# Patient Record
Sex: Female | Born: 1935 | Race: Black or African American | Hispanic: No | Smoking: Former smoker
Health system: Southern US, Community
[De-identification: ages and names within clinical notes are randomized; demographics above are authoritative.]

## PROBLEM LIST (undated history)

## (undated) DIAGNOSIS — E782 Mixed hyperlipidemia: Secondary | ICD-10-CM

## (undated) DIAGNOSIS — I447 Left bundle-branch block, unspecified: Secondary | ICD-10-CM

## (undated) DIAGNOSIS — J189 Pneumonia, unspecified organism: Secondary | ICD-10-CM

## (undated) DIAGNOSIS — I429 Cardiomyopathy, unspecified: Secondary | ICD-10-CM

## (undated) DIAGNOSIS — F329 Major depressive disorder, single episode, unspecified: Secondary | ICD-10-CM

## (undated) DIAGNOSIS — R011 Cardiac murmur, unspecified: Secondary | ICD-10-CM

## (undated) DIAGNOSIS — R7989 Other specified abnormal findings of blood chemistry: Secondary | ICD-10-CM

## (undated) DIAGNOSIS — M5481 Occipital neuralgia: Secondary | ICD-10-CM

## (undated) DIAGNOSIS — R0600 Dyspnea, unspecified: Secondary | ICD-10-CM

## (undated) DIAGNOSIS — I10 Essential (primary) hypertension: Secondary | ICD-10-CM

## (undated) DIAGNOSIS — M1990 Unspecified osteoarthritis, unspecified site: Secondary | ICD-10-CM

## (undated) DIAGNOSIS — R413 Other amnesia: Secondary | ICD-10-CM

## (undated) DIAGNOSIS — R3915 Urgency of urination: Secondary | ICD-10-CM

## (undated) DIAGNOSIS — M8580 Other specified disorders of bone density and structure, unspecified site: Secondary | ICD-10-CM

## (undated) DIAGNOSIS — K219 Gastro-esophageal reflux disease without esophagitis: Secondary | ICD-10-CM

## (undated) DIAGNOSIS — F32A Depression, unspecified: Secondary | ICD-10-CM

## (undated) DIAGNOSIS — J45909 Unspecified asthma, uncomplicated: Secondary | ICD-10-CM

## (undated) DIAGNOSIS — J449 Chronic obstructive pulmonary disease, unspecified: Secondary | ICD-10-CM

## (undated) DIAGNOSIS — R51 Headache: Secondary | ICD-10-CM

## (undated) DIAGNOSIS — F419 Anxiety disorder, unspecified: Secondary | ICD-10-CM

## (undated) DIAGNOSIS — Z9289 Personal history of other medical treatment: Secondary | ICD-10-CM

## (undated) DIAGNOSIS — R519 Headache, unspecified: Secondary | ICD-10-CM

## (undated) HISTORY — DX: Anxiety disorder, unspecified: F41.9

## (undated) HISTORY — DX: Depression, unspecified: F32.A

## (undated) HISTORY — PX: EYE SURGERY: SHX253

## (undated) HISTORY — DX: Other amnesia: R41.3

## (undated) HISTORY — PX: APPENDECTOMY: SHX54

## (undated) HISTORY — PX: DILATION AND CURETTAGE OF UTERUS: SHX78

## (undated) HISTORY — DX: Other specified abnormal findings of blood chemistry: R79.89

## (undated) HISTORY — PX: BACK SURGERY: SHX140

## (undated) HISTORY — PX: VAGINAL HYSTERECTOMY: SUR661

## (undated) HISTORY — DX: Other specified disorders of bone density and structure, unspecified site: M85.80

## (undated) HISTORY — DX: Major depressive disorder, single episode, unspecified: F32.9

## (undated) HISTORY — PX: TONSILLECTOMY: SUR1361

## (undated) HISTORY — PX: JOINT REPLACEMENT: SHX530

---

## 1989-04-30 HISTORY — PX: LAPAROSCOPIC CHOLECYSTECTOMY: SUR755

## 2000-11-15 ENCOUNTER — Ambulatory Visit (HOSPITAL_COMMUNITY): Admission: RE | Admit: 2000-11-15 | Discharge: 2000-11-15 | Payer: Self-pay | Admitting: Internal Medicine

## 2000-11-15 ENCOUNTER — Other Ambulatory Visit: Admission: RE | Admit: 2000-11-15 | Discharge: 2000-11-15 | Payer: Self-pay | Admitting: Internal Medicine

## 2000-11-15 ENCOUNTER — Encounter: Payer: Self-pay | Admitting: Internal Medicine

## 2000-11-18 ENCOUNTER — Encounter: Payer: Self-pay | Admitting: Internal Medicine

## 2000-11-18 ENCOUNTER — Ambulatory Visit (HOSPITAL_COMMUNITY): Admission: RE | Admit: 2000-11-18 | Discharge: 2000-11-18 | Payer: Self-pay | Admitting: Internal Medicine

## 2001-02-01 ENCOUNTER — Emergency Department (HOSPITAL_COMMUNITY): Admission: EM | Admit: 2001-02-01 | Discharge: 2001-02-01 | Payer: Self-pay | Admitting: *Deleted

## 2001-02-01 ENCOUNTER — Encounter: Payer: Self-pay | Admitting: *Deleted

## 2001-10-29 ENCOUNTER — Emergency Department (HOSPITAL_COMMUNITY): Admission: EM | Admit: 2001-10-29 | Discharge: 2001-10-29 | Payer: Self-pay | Admitting: Emergency Medicine

## 2001-12-11 ENCOUNTER — Encounter: Payer: Self-pay | Admitting: Internal Medicine

## 2001-12-11 ENCOUNTER — Ambulatory Visit (HOSPITAL_COMMUNITY): Admission: RE | Admit: 2001-12-11 | Discharge: 2001-12-11 | Payer: Self-pay | Admitting: Internal Medicine

## 2002-07-11 ENCOUNTER — Encounter: Payer: Self-pay | Admitting: Internal Medicine

## 2002-07-11 ENCOUNTER — Emergency Department (HOSPITAL_COMMUNITY): Admission: EM | Admit: 2002-07-11 | Discharge: 2002-07-11 | Payer: Self-pay | Admitting: Emergency Medicine

## 2002-12-23 ENCOUNTER — Ambulatory Visit (HOSPITAL_COMMUNITY): Admission: RE | Admit: 2002-12-23 | Discharge: 2002-12-23 | Payer: Self-pay | Admitting: Internal Medicine

## 2002-12-23 ENCOUNTER — Encounter: Payer: Self-pay | Admitting: Internal Medicine

## 2003-01-01 ENCOUNTER — Emergency Department (HOSPITAL_COMMUNITY): Admission: EM | Admit: 2003-01-01 | Discharge: 2003-01-01 | Payer: Self-pay | Admitting: Emergency Medicine

## 2003-03-28 ENCOUNTER — Emergency Department (HOSPITAL_COMMUNITY): Admission: EM | Admit: 2003-03-28 | Discharge: 2003-03-28 | Payer: Self-pay | Admitting: Emergency Medicine

## 2003-04-23 ENCOUNTER — Encounter: Payer: Self-pay | Admitting: Orthopedic Surgery

## 2003-06-01 ENCOUNTER — Encounter (HOSPITAL_COMMUNITY): Admission: RE | Admit: 2003-06-01 | Discharge: 2003-07-01 | Payer: Self-pay | Admitting: Orthopedic Surgery

## 2003-07-06 ENCOUNTER — Encounter (HOSPITAL_COMMUNITY): Admission: RE | Admit: 2003-07-06 | Discharge: 2003-08-05 | Payer: Self-pay | Admitting: Orthopedic Surgery

## 2003-11-15 ENCOUNTER — Ambulatory Visit (HOSPITAL_COMMUNITY): Admission: RE | Admit: 2003-11-15 | Discharge: 2003-11-15 | Payer: Self-pay | Admitting: Internal Medicine

## 2004-01-13 ENCOUNTER — Ambulatory Visit (HOSPITAL_COMMUNITY): Admission: RE | Admit: 2004-01-13 | Discharge: 2004-01-13 | Payer: Self-pay | Admitting: Internal Medicine

## 2004-05-18 ENCOUNTER — Ambulatory Visit (HOSPITAL_COMMUNITY): Admission: RE | Admit: 2004-05-18 | Discharge: 2004-05-18 | Payer: Self-pay | Admitting: Internal Medicine

## 2004-06-08 ENCOUNTER — Emergency Department (HOSPITAL_COMMUNITY): Admission: EM | Admit: 2004-06-08 | Discharge: 2004-06-08 | Payer: Self-pay | Admitting: Emergency Medicine

## 2004-09-04 ENCOUNTER — Ambulatory Visit: Payer: Self-pay | Admitting: Orthopedic Surgery

## 2005-07-16 ENCOUNTER — Ambulatory Visit: Payer: Self-pay | Admitting: Orthopedic Surgery

## 2005-08-29 ENCOUNTER — Ambulatory Visit: Payer: Self-pay | Admitting: Cardiology

## 2005-09-25 ENCOUNTER — Ambulatory Visit: Payer: Self-pay

## 2005-09-25 ENCOUNTER — Encounter: Payer: Self-pay | Admitting: Cardiology

## 2006-05-12 ENCOUNTER — Emergency Department (HOSPITAL_COMMUNITY): Admission: EM | Admit: 2006-05-12 | Discharge: 2006-05-12 | Payer: Self-pay | Admitting: Emergency Medicine

## 2007-04-16 ENCOUNTER — Ambulatory Visit: Payer: Self-pay | Admitting: Cardiology

## 2007-04-22 ENCOUNTER — Encounter: Payer: Self-pay | Admitting: Cardiology

## 2007-04-22 ENCOUNTER — Ambulatory Visit: Payer: Self-pay

## 2007-05-05 ENCOUNTER — Ambulatory Visit: Payer: Self-pay

## 2007-07-24 ENCOUNTER — Ambulatory Visit: Payer: Self-pay | Admitting: Orthopedic Surgery

## 2007-07-24 DIAGNOSIS — IMO0002 Reserved for concepts with insufficient information to code with codable children: Secondary | ICD-10-CM

## 2007-10-08 ENCOUNTER — Ambulatory Visit: Payer: Self-pay | Admitting: Gastroenterology

## 2007-10-22 ENCOUNTER — Ambulatory Visit: Payer: Self-pay | Admitting: Gastroenterology

## 2008-04-30 HISTORY — PX: CARDIAC CATHETERIZATION: SHX172

## 2008-05-07 ENCOUNTER — Ambulatory Visit: Payer: Self-pay | Admitting: Cardiology

## 2008-05-11 ENCOUNTER — Ambulatory Visit: Payer: Self-pay | Admitting: Cardiology

## 2008-05-11 ENCOUNTER — Ambulatory Visit (HOSPITAL_COMMUNITY): Admission: RE | Admit: 2008-05-11 | Discharge: 2008-05-11 | Payer: Self-pay | Admitting: Cardiology

## 2008-05-25 ENCOUNTER — Inpatient Hospital Stay (HOSPITAL_COMMUNITY): Admission: EM | Admit: 2008-05-25 | Discharge: 2008-05-28 | Payer: Self-pay | Admitting: Emergency Medicine

## 2008-06-28 ENCOUNTER — Ambulatory Visit: Payer: Self-pay | Admitting: Cardiology

## 2008-06-28 ENCOUNTER — Ambulatory Visit: Payer: Self-pay | Admitting: Gastroenterology

## 2008-07-10 ENCOUNTER — Emergency Department (HOSPITAL_COMMUNITY): Admission: EM | Admit: 2008-07-10 | Discharge: 2008-07-10 | Payer: Self-pay | Admitting: Emergency Medicine

## 2008-07-12 ENCOUNTER — Emergency Department (HOSPITAL_COMMUNITY): Admission: EM | Admit: 2008-07-12 | Discharge: 2008-07-12 | Payer: Self-pay | Admitting: Emergency Medicine

## 2008-07-20 ENCOUNTER — Inpatient Hospital Stay (HOSPITAL_COMMUNITY): Admission: RE | Admit: 2008-07-20 | Discharge: 2008-07-27 | Payer: Self-pay | Admitting: Neurosurgery

## 2008-07-27 ENCOUNTER — Inpatient Hospital Stay: Admission: RE | Admit: 2008-07-27 | Discharge: 2008-08-11 | Payer: Self-pay | Admitting: Internal Medicine

## 2008-08-16 DIAGNOSIS — E785 Hyperlipidemia, unspecified: Secondary | ICD-10-CM

## 2008-08-16 DIAGNOSIS — M19011 Primary osteoarthritis, right shoulder: Secondary | ICD-10-CM | POA: Insufficient documentation

## 2008-08-16 DIAGNOSIS — I10 Essential (primary) hypertension: Secondary | ICD-10-CM

## 2008-08-16 DIAGNOSIS — B1910 Unspecified viral hepatitis B without hepatic coma: Secondary | ICD-10-CM | POA: Insufficient documentation

## 2008-11-24 ENCOUNTER — Ambulatory Visit: Payer: Self-pay | Admitting: Cardiology

## 2008-12-07 ENCOUNTER — Ambulatory Visit: Payer: Self-pay | Admitting: Pulmonary Disease

## 2008-12-07 ENCOUNTER — Encounter: Payer: Self-pay | Admitting: Internal Medicine

## 2008-12-07 DIAGNOSIS — J309 Allergic rhinitis, unspecified: Secondary | ICD-10-CM | POA: Insufficient documentation

## 2008-12-07 DIAGNOSIS — R053 Chronic cough: Secondary | ICD-10-CM | POA: Insufficient documentation

## 2008-12-07 DIAGNOSIS — R05 Cough: Secondary | ICD-10-CM | POA: Insufficient documentation

## 2009-04-30 HISTORY — PX: SHOULDER OPEN ROTATOR CUFF REPAIR: SHX2407

## 2009-04-30 HISTORY — PX: LUMBAR LAMINECTOMY: SHX95

## 2010-02-01 ENCOUNTER — Observation Stay (HOSPITAL_COMMUNITY): Admission: RE | Admit: 2010-02-01 | Discharge: 2010-02-02 | Payer: Self-pay | Admitting: Orthopedic Surgery

## 2010-03-13 ENCOUNTER — Encounter
Admission: RE | Admit: 2010-03-13 | Discharge: 2010-04-27 | Payer: Self-pay | Source: Home / Self Care | Attending: Orthopedic Surgery | Admitting: Orthopedic Surgery

## 2010-03-21 ENCOUNTER — Encounter: Payer: Self-pay | Admitting: Orthopedic Surgery

## 2010-03-21 ENCOUNTER — Telehealth (INDEPENDENT_AMBULATORY_CARE_PROVIDER_SITE_OTHER): Payer: Self-pay | Admitting: *Deleted

## 2010-04-05 ENCOUNTER — Telehealth (INDEPENDENT_AMBULATORY_CARE_PROVIDER_SITE_OTHER): Payer: Self-pay | Admitting: *Deleted

## 2010-04-30 ENCOUNTER — Encounter
Admission: RE | Admit: 2010-04-30 | Discharge: 2010-05-30 | Payer: Self-pay | Source: Home / Self Care | Attending: Orthopedic Surgery | Admitting: Orthopedic Surgery

## 2010-05-30 NOTE — Progress Notes (Signed)
   Request for Records received from [facility] sent to [facility] for processing.' [facility]  April 05, 2010 9:44 AM

## 2010-05-30 NOTE — Progress Notes (Signed)
  Phone Note Other Incoming   Request: Send information Summary of Call: Records request received from [employer], request forwarded to [facility].

## 2010-06-01 ENCOUNTER — Encounter: Payer: Self-pay | Admitting: Physical Therapy

## 2010-06-01 NOTE — Letter (Signed)
Summary: Medical record request   Medical record request   Imported By: Jacklynn Ganong Apr 05, 202011 11:51:27  _____________________________________________________________________  External Attachment:    Type:   Image     Comment:   External Document

## 2010-06-05 ENCOUNTER — Ambulatory Visit: Payer: Self-pay | Admitting: Physical Therapy

## 2010-06-08 ENCOUNTER — Ambulatory Visit: Payer: MEDICARE | Attending: Orthopedic Surgery | Admitting: *Deleted

## 2010-06-08 DIAGNOSIS — M25519 Pain in unspecified shoulder: Secondary | ICD-10-CM | POA: Insufficient documentation

## 2010-06-08 DIAGNOSIS — R5381 Other malaise: Secondary | ICD-10-CM | POA: Insufficient documentation

## 2010-06-08 DIAGNOSIS — IMO0001 Reserved for inherently not codable concepts without codable children: Secondary | ICD-10-CM | POA: Insufficient documentation

## 2010-06-08 DIAGNOSIS — M25619 Stiffness of unspecified shoulder, not elsewhere classified: Secondary | ICD-10-CM | POA: Insufficient documentation

## 2010-06-13 ENCOUNTER — Ambulatory Visit: Payer: MEDICARE | Admitting: Physical Therapy

## 2010-06-13 ENCOUNTER — Ambulatory Visit: Payer: MEDICARE | Admitting: *Deleted

## 2010-07-13 LAB — DIFFERENTIAL
Basophils Absolute: 0 10*3/uL (ref 0.0–0.1)
Basophils Relative: 0 % (ref 0–1)
Neutro Abs: 4.5 10*3/uL (ref 1.7–7.7)
Neutrophils Relative %: 63 % (ref 43–77)

## 2010-07-13 LAB — COMPREHENSIVE METABOLIC PANEL
ALT: 22 U/L (ref 0–35)
AST: 27 U/L (ref 0–37)
Albumin: 4.1 g/dL (ref 3.5–5.2)
Alkaline Phosphatase: 82 U/L (ref 39–117)
BUN: 14 mg/dL (ref 6–23)
Chloride: 106 mEq/L (ref 96–112)
GFR calc Af Amer: 57 mL/min — ABNORMAL LOW (ref >60)
Potassium: 3.6 mEq/L (ref 3.5–5.1)
Sodium: 141 mEq/L (ref 135–145)
Total Bilirubin: 0.5 mg/dL (ref 0.3–1.2)
Total Protein: 7.5 g/dL (ref 6.0–8.3)

## 2010-07-13 LAB — APTT: aPTT: 35 seconds (ref 24–37)

## 2010-07-13 LAB — CBC
MCV: 91 fL (ref 78.0–100.0)
Platelets: 333 10*3/uL (ref 150–400)
RBC: 4.19 MIL/uL (ref 3.87–5.11)
RDW: 13.9 % (ref 11.5–15.5)
WBC: 7.2 10*3/uL (ref 4.0–10.5)

## 2010-07-13 LAB — URINALYSIS, ROUTINE W REFLEX MICROSCOPIC
Bilirubin Urine: NEGATIVE
Glucose, UA: NEGATIVE mg/dL
Ketones, ur: NEGATIVE mg/dL
Specific Gravity, Urine: 1.004 — ABNORMAL LOW (ref 1.005–1.030)
pH: 6 (ref 5.0–8.0)

## 2010-07-13 LAB — URINE MICROSCOPIC-ADD ON

## 2010-07-13 LAB — PROTIME-INR: INR: 1.03 (ref 0.00–1.49)

## 2010-08-10 LAB — URINALYSIS, ROUTINE W REFLEX MICROSCOPIC
Bilirubin Urine: NEGATIVE
Bilirubin Urine: NEGATIVE
Glucose, UA: NEGATIVE mg/dL
Glucose, UA: NEGATIVE mg/dL
Hgb urine dipstick: NEGATIVE
Ketones, ur: NEGATIVE mg/dL
Leukocytes, UA: NEGATIVE
Protein, ur: 30 mg/dL — AB
Specific Gravity, Urine: 1.015 (ref 1.005–1.030)
Specific Gravity, Urine: >1.03 — ABNORMAL HIGH (ref 1.005–1.030)
Urobilinogen, UA: 0.2 mg/dL (ref 0.0–1.0)
pH: 5.5 (ref 5.0–8.0)
pH: 8 (ref 5.0–8.0)

## 2010-08-10 LAB — URINE MICROSCOPIC-ADD ON

## 2010-08-10 LAB — URINE CULTURE: Colony Count: >=100000

## 2010-08-10 LAB — CBC
Platelets: 391 10*3/uL (ref 150–400)
RDW: 13.6 % (ref 11.5–15.5)

## 2010-08-10 LAB — TYPE AND SCREEN: Antibody Screen: NEGATIVE

## 2010-08-14 LAB — BASIC METABOLIC PANEL
BUN: 9 mg/dL (ref 6–23)
CO2: 25 mEq/L (ref 19–32)
Calcium: 8.4 mg/dL (ref 8.4–10.5)
GFR calc Af Amer: >60 mL/min (ref >60)
GFR calc non Af Amer: >60 mL/min (ref >60)
GFR calc non Af Amer: >60 mL/min (ref >60)
Glucose, Bld: 114 mg/dL — ABNORMAL HIGH (ref 70–99)
Glucose, Bld: 81 mg/dL (ref 70–99)
Potassium: 3.8 mEq/L (ref 3.5–5.1)
Potassium: 3.8 mEq/L (ref 3.5–5.1)
Sodium: 141 mEq/L (ref 135–145)
Sodium: 143 mEq/L (ref 135–145)

## 2010-08-14 LAB — URINALYSIS, ROUTINE W REFLEX MICROSCOPIC
Bilirubin Urine: NEGATIVE
Ketones, ur: NEGATIVE mg/dL
Leukocytes, UA: NEGATIVE
Nitrite: NEGATIVE
Protein, ur: 100 mg/dL — AB

## 2010-08-14 LAB — DIFFERENTIAL
Basophils Absolute: 0 10*3/uL (ref 0.0–0.1)
Basophils Relative: 0 % (ref 0–1)
Basophils Relative: 1 % (ref 0–1)
Eosinophils Absolute: 0 10*3/uL (ref 0.0–0.7)
Eosinophils Relative: 0 % (ref 0–5)
Lymphocytes Relative: 17 % (ref 12–46)
Lymphocytes Relative: 46 % (ref 12–46)
Monocytes Absolute: 0.3 10*3/uL (ref 0.1–1.0)
Monocytes Absolute: 0.4 10*3/uL (ref 0.1–1.0)
Monocytes Relative: 5 % (ref 3–12)
Neutro Abs: 2.4 10*3/uL (ref 1.7–7.7)
Neutrophils Relative %: 44 % (ref 43–77)

## 2010-08-14 LAB — POCT I-STAT, CHEM 8
BUN: 9 mg/dL (ref 6–23)
Creatinine, Ser: 1.2 mg/dL (ref 0.4–1.2)
Glucose, Bld: 109 mg/dL — ABNORMAL HIGH (ref 70–99)
Hemoglobin: 15.6 g/dL — ABNORMAL HIGH (ref 12.0–15.0)
Potassium: 3.5 mEq/L (ref 3.5–5.1)
TCO2: 28 mmol/L (ref 0–100)

## 2010-08-14 LAB — POCT I-STAT 3, ART BLOOD GAS (G3+)
Bicarbonate: 21.5 mEq/L (ref 20.0–24.0)
Bicarbonate: 23.6 mEq/L (ref 20.0–24.0)
O2 Saturation: 62 %
O2 Saturation: 96 %
TCO2: 23 mmol/L (ref 0–100)
TCO2: 25 mmol/L (ref 0–100)
pCO2 arterial: 46 mmHg — ABNORMAL HIGH (ref 35.0–45.0)
pH, Arterial: 7.277 — ABNORMAL LOW (ref 7.350–7.400)
pO2, Arterial: 36 mmHg — CL (ref 80.0–100.0)

## 2010-08-14 LAB — CBC
HCT: 37.7 % (ref 36.0–46.0)
Hemoglobin: 11.6 g/dL — ABNORMAL LOW (ref 12.0–15.0)
Hemoglobin: 13 g/dL (ref 12.0–15.0)
MCHC: 33.9 g/dL (ref 30.0–36.0)
MCHC: 34.4 g/dL (ref 30.0–36.0)
Platelets: 300 10*3/uL (ref 150–400)
RBC: 3.7 MIL/uL — ABNORMAL LOW (ref 3.87–5.11)
RDW: 14 % (ref 11.5–15.5)
WBC: 5.6 10*3/uL (ref 4.0–10.5)

## 2010-08-14 LAB — LIPASE, BLOOD: Lipase: 22 U/L (ref 11–59)

## 2010-08-14 LAB — HEPATIC FUNCTION PANEL
ALT: 15 U/L (ref 0–35)
Bilirubin, Direct: <0.1 mg/dL (ref 0.0–0.3)
Total Protein: 6.1 g/dL (ref 6.0–8.3)

## 2010-09-12 NOTE — Discharge Summary (Signed)
NAMESABRIEL, BORROMEO               ACCOUNT NO.:  [id number]   MEDICAL RECORD NO.:  [id number]          PATIENT TYPE:  INP   LOCATION:  3003                         FACILITY:  [facility]   PHYSICIAN:  Danae Orleans. Venetia Maxon, M.D.  DATE OF BIRTH:  Mar 25, 1935   DATE OF ADMISSION:  07/20/2008  DATE OF DISCHARGE:                               DISCHARGE SUMMARY   MAIN DIAGNOSES:  1. Scoliosis.  2. Spondylolisthesis.  3. Spinal stenosis.  4. Radiculopathy L4-5.   SECONDARY DIAGNOSES:  1. Hypertension.  2. History of hepatitis B.  3. Low vitamin D.  4. Osteoarthritis.  5. Hypercholesterolemia.  6. Depression.   DISCHARGE DIAGNOSIS:  1. Scoliosis.  2. Spondylolisthesis.  3. Spinal stenosis.  4. Radiculopathy L4-5.  5. Hypertension.  6. History of hepatitis B.  7. Low vitamin D.  8. Osteoarthritis.  9. Hypercholesterolemia.  10.Depression.   OPERATIONS AND PROCEDURES:  L4-5 bilateral decompression, posterolateral  interbody fusion with PEEK cages, autograft BMP EquivaBone graft,  pedicle screw fixation, L4-5 bilaterally with posterolateral arthrodesis  by Dr. Venetia Maxon assisted by Dr. Newell Coral.   BRIEF HISTORY:  The patient is a 76 year old black female who has had  longstanding low back problems.  Has not had responded to conservative  measures affecting quality of life.  She elects to proceed with surgical  intervention.   HOSPITAL COURSE:  Mrs. Casalino underwent surgical intervention for her  lumbar spine, posterior spinal fusion at L4-5 with decompression on  July 20, 2008.  Tolerated the procedure well.  Stabilized in recovery  room.  Placed on PCA morphine pump for pain control.  She made very slow  progress postoperatively with mobilization.  She was ultimately weaned  off PCA morphine pump to p.o. Percocet and using Robaxin for muscle  relaxation.  She was continued on her home medications during her  hospitalization for her underlying medical problems.  Due to her slow  progress  with therapy, occupational therapy and physical therapy, it was  felt that she would benefit from comprehensive inpatient rehabilitation.  She did not qualify for comprehensive inpatient further rehabilitation  and to get her to a safe level for ambulation and to care strength back,  it was felt that she would benefit from a temporary skilled nursing  facility for comprehensive rehabilitation.  She was ready for discharge  July 27, 2008.  A placement was found in [facility] where her  family is from and where she lives.  The ultimate plan is for her to  return home.   At the time of discharge, she was comfortable, p.o. pain medications.  Still needed assistance with ambulation  and activities of daily living.  During her hospitalization she did have a left foot gouty flare.  She  has had gout in her foot in the past.  She was restarted on allopurinol,  colchicine, given Toradol, and then started on Indocin and her gouty was  under control at the time of discharge, with less pain with range of  motion and with weightbearing.  She also was found to have urinary tract  infection during her hospitalization  and was started on Cipro.  Her  sensitivity showed her to be sensitive to this and she will be continued  on that when she gets discharged to treat her urinary tract infection.  She was eating well, voiding well and ready for discharge to skilled  nursing facility on July 27, 2008.   CONDITION ON DISCHARGE:  Stable, improved.   DISCHARGE INSTRUCTIONS:  Discharged to skilled nursing facility.  Continue physical therapy, occupational therapy and back precautions.  Continue with Aspen quick draw brace for her back when she is up and  ambulating.   DISCHARGE MEDICATIONS:  1. Amitriptyline 25 mg at bedtime.  2. Simvastatin 10 mg daily.  3. Vitamin D 50,000 units weekly.  4. Lisinopril/hydrochlorothiazide 10/12.5 q.a.m..  5. Aspirin 81 mg p.o. daily.  6. Boniva monthly.  7.  Women's one-a-day vitamins in the morning.  8. Centrum in the morning.  9. Ester-C in the morning.  10.Percocet 5/325 one p.o. q.4 h  p.r.n. pain.  11.Robaxin 500 mg p.o. q.6 h p.r.n. muscle spasm.  12.Cipro 500 mg p.o. b.i.d. for four more days.  13.She will also be on Colace 100 mg p.o. b.i.d.  14.Allopurinol 300 mg p.o. b.i.d. for 10 days.  15.Colchicine 0.6 mg p.o. daily for 10 days.  16.Indocin SR 75 mg p.o. b.i.d. for 10 days for acute gouty flare.   FOLLOW UP:  She will follow up with Dr. Venetia Maxon and 3 to 4 weeks.  Contact  our office, [phone], for follow-up appointment.   She was given a prescription for Percocet since this is a scheduled  narcotic.  All other medicine should be  obtainable from skilled nursing  facility.  All questions were encouraged and answered and addressed.  Contact our  office prior to follow up with any question or concerns.  Continue with  progressive ambulation, activities of daily living, occupational  therapy, physical therapy with ultimate goal of getting her home with  help from her family.  She was afebrile.  Vital signs were stable at  time of discharge.      Aura Fey Bobbe Medico.      Danae Orleans. Venetia Maxon, M.D.  Electronically Signed    SCI/MEDQ  D:  07/27/2008  T:  07/27/2008  Job:  [id number]

## 2010-09-12 NOTE — Discharge Summary (Signed)
[name], [name]               ACCOUNT NO.:  [id number]   MEDICAL RECORD NO.:  [id number]          PATIENT TYPE:  INP   LOCATION:  A316                          FACILITY:  [facility]   PHYSICIAN:  [name] Shipper, MD     DATE OF BIRTH:  [birth year]   DATE OF ADMISSION:  05/25/2008  DATE OF DISCHARGE:  01/29/2010LH                               DISCHARGE SUMMARY   PRIMARY CARE PHYSICIAN:  [name], M.D. and [name] in  [facility].   DISCHARGE DIAGNOSES:  1. Enteritis, etiology unclear, improved.  2. History of hypertension.  3. Tobacco abuse.  4. Osteoporosis.   Please review H and P dictated by Dr. Dorris [name] for details  regarding the patient's presenting illness.   BRIEF HOSPITAL COURSE:  Briefly, this is a 76 year old African American  female who presented to the hospital with complaints of abdominal pain,  nausea, vomiting.  She was thought to have gastroenteritis.  CT of the  abdomen and pelvis was done which showed evidence for inflammatory  changes within the small bowel mesentery.  Other nonspecific findings  were also noted, not of clinical significance at this time.   The patient was put on Cipro and Flagyl.  She was given IV fluids.  She  was slow to improve.  She was having a lot of abdominal pain when she  came in.  She was given IV narcotics which were changed over to p.o.  narcotics and this morning she is completely pain free.   This morning, however, she is complaining of diarrhea, yellow stool  which is watery.  She has had 3 episodes so far.  No nausea, vomiting,  no abdominal pain.  Vital signs are all stable.  Her abdomen is soft,  nontender, nondistended.  Bowel sounds are present.  No masses or  organomegaly is appreciated.  No labs have been done this morning.   She will be given 1 dose of Imodium.  If diarrhea subside, she will be  able to go home after she tolerates her lunch.  I have told the patient  that she  needs to continue with Cipro and Flagyl, and use over-the-  counter Imodium if she continues to have diarrhea at home.  I have told  her that this diarrhea should subside within the next couple of days.  If it does not, she needs to seek attention immediately.   DISCHARGE MEDICATIONS:  1. Cipro 500 mg b.i.d. for 7 days.  2. Flagyl 500 mg t.i.d. for 7 days.  3. Percocet 5/325 1 tablet every 6 hours as needed for pain.  4. Nicotine patch 14 mg daily.  5. She will continue her home medications which include HCTZ 12.5 mg      daily.  6. Amitriptyline 25 mg daily.  7. Simvastatin 10 mg daily.  8. Aspirin 81 mg daily.  9. Vitamin D 50,000 units weekly.  10.Boniva 150 mg q. month.   DISCHARGE INSTRUCTIONS:  She was strongly counseled to quit smoking.   DIET:  Low-residue diet.   ACTIVITY:  Physical activity as before.  FOLLOW UP:  Follow up with PMD in 1 week.   CONSULTATIONS:  No consultations obtained during this admission.   DIAGNOSTICS:  Apart from the CT, she had an abdominal x-ray which did  not show any acute findings.   Total time on this encounter was about 35 minutes.      [name] Shipper, MD  Electronically Signed     GK/MEDQ  D:  05/28/2008  T:  05/28/2008  Job:  [id number]   cc:   [facility]

## 2010-09-12 NOTE — Assessment & Plan Note (Signed)
[facility]                            CARDIOLOGY OFFICE NOTE   [name], [name]                      MRN:          [id number]  DATE:05/07/2008                            DOB:          09-08-1934    PRIMARY CARE PHYSICIAN:  Ernestina Penna, MD   REASON FOR PRESENTATION:  Evaluate the patient with dyspnea and chest  discomfort.   HISTORY OF PRESENT ILLNESS:  The patient presents about a year after the  last time I saw her.  She had complaints of dyspnea in 2008.  I sent her  for a stress perfusion study which demonstrated no evidence of ischemia.  She just had a left bundle-branch block.  Her ejection fraction was felt  to be slightly low at 49%.  An echocardiogram demonstrated an EF of 55-  60%.  There was evidence of moderate aortic valve regurgitation.  She  did have some evidence of diastolic dysfunction though her left  ventricular wall thickness was not increased.  I did advise her to stop  smoking after these studies.  However, she has continued to get  increasing dyspnea.  This was happening now with mild activity.  She  said she could not walk in from the waiting to the exam room without  being somewhat short of breath.  She could not climb a flight of stairs.  She also felt a fullness or tightness in her chest when she is doing  this.  She does not describe any radiation to her jaws or arms.  She has  not had any associated nausea, vomiting, or diaphoresis.  She is not  describing any palpitation, presyncope, or syncope.  She said that  actually a couple of days ago, she woke up with short of breath.  She  got up and walked around and was able to catch her breath and go back to  bed.  She is not feeling any fevers or chills or cough.  She says she is  smoking a few cigarettes a day though her daughter seems to indicate may  be more.  She did not tolerate Chantix when I prescribed it in the past.  She says her breathing does get better  when she stops what she is doing.  She has no weight gain or swelling.   PAST MEDICAL HISTORY:  Hypertension (she did have a hypertensive  response with exercise during stress testing in the past.  She reports  her blood pressure has been well-controlled on hydrochlorothiazide which  I started last year), hepatitis B, low vitamin D, and osteoarthritis.   PAST SURGICAL HISTORY:  Appendectomy, hysterectomy, cholecystectomy,  knee surgery, hemorrhoid surgery, and foot surgery.   ALLERGIES:  None.   MEDICATIONS:  1. Boniva.  2. Detrol LA.  3. Multivitamin.  4. Hydrochlorothiazide 12.5 mg daily.  5. Simvastatin 10 mg daily.  6. Aspirin 81 mg daily.  7. Ergocalciferol.   SOCIAL HISTORY:  The patient is divorced.  She has 4 children.  She has  been smoking cigarettes for many years and reports to me only a few  cigarettes a  day although perhaps more.  She does not drink alcohol.   FAMILY HISTORY:  Contributory for father dying of myocardial infraction,  age 1.  She has multiple brothers and sisters without heart disease.   REVIEW OF SYSTEMS:  As stated in the HPI and negative for all other  systems.   PHYSICAL EXAMINATION:  GENERAL:  The patient is pleasant and in no  distress.  VITAL SIGNS:  Blood pressure 126/84, heart rate 73 and regular.  Weight  156 pounds, body mass index 27.  HEENT:  Eyes unremarkable; pupils equal, round, and reactive to light;  fundi not visualized; oral mucosa unremarkable.  NECK:  No jugular venous distention; carotid upstroke brisk and  symmetrical; no bruits; no thyromegaly.  LYMPHATICS:  No cervical, axillary, or inguinal adenopathy.  LUNGS:  Clear to auscultation bilaterally.  BACK:  No costovertebral angle tenderness.  CHEST:  Unremarkable.  HEART:  PMI not displaced or sustained.  S1 and S2 are within normal  limits.  No S3, positive S4.  No murmurs.  ABDOMEN:  Flat; positive bowel sounds, normal in frequency and pitch.  No bruits, no  rebound, no guarding.  No midline pulsatile mass.  No  organomegaly, no splenomegaly.  SKIN:  No rashes, no nodules.  EXTREMITIES:  2+ pulses throughout, no edema.  No cyanosis.  No  clubbing.  NEURO:  Oriented to person, place, and time.  Cranial nerves II through  XII grossly intact.  Motor grossly intact.   EKG:  Normal sinus rhythm, left bundle-branch block, right atrial  enlargement, no acute ST-T wave changes.  Her bundle-branch block seems  to have progressed from previous.   ASSESSMENT AND PLAN:  1. Dyspnea.  The patient has progressive dyspnea on exertion and chest      tightness.  She has multiple cardiovascular risk factors.  The pre-      test probability of obstructive coronary disease is high.      Therefore, cardiac catheterization is indicated.  I discussed this      with her.  I outlined specifically the risks and benefits including      death, myocardial infraction, stroke, bleeding, bruising,      infection, renal insufficiency, vascular trauma, dye allergy, and      others.  She understands and agrees to proceed.  She needs a right      heart catheterization at the same time.  I will have her do some      exercise during the right heart catheterization to see if there      might be any evidence of pulmonary hypertension, exercise-induced.  2. Hypertension.  Her blood pressure does seem to be controlled at      rest.  Some of her symptoms could be related to hypertensive      response with exercise and again, I will do the study as above.  I      will have a low threshold for further increasing her blood pressure      medications.  3. Tobacco.  I counseled her again about the need to stop smoking and      will do further counseling.  She could not tolerate Chantix.  4. Dyslipidemia.  Per Paulita Cradle, with goal LDL less than 100 and      HDL greater than 40 and is a longstanding smoker.  5. Osteoarthritis.  She is on Detrol LA.  6. Left bundle-branch block.   This is new compared to the previous  EKG.  7. Followup.  I will see her again at the time of her catheterization.     Rollene Rotunda, MD, [facility]  Electronically Signed    JH/MedQ  DD: 05/07/2008  DT: 05/08/2008  Job #: [id number]   cc:   Paulita Cradle, FNP

## 2010-09-12 NOTE — Assessment & Plan Note (Signed)
[facility]                            CARDIOLOGY OFFICE NOTE   NAME:[name], [name] RESNICK                      MRN:          [id number]  DATE:04/16/2007                            DOB:          [birth year]    PRIMARY CARE PHYSICIAN:  Dr. Vernon Prey.   REASON FOR PRESENTATION:  Evaluate patient with abnormal exercise  treadmill test.   The patient was added on for a consult today after an abnormal exercise  treadmill test.  She had this to evaluate shortness of breath.  I had  seen her in the past for palpitations and an abnormal EKG.  She was not  due to see me in followup for the aortic insufficiency, but did not come  to this appointment.  The aortic insufficiency was moderate.  She had  normal left ventricular function.   She has been complaining of increasing shortness of breath.  This has  been slowly progressive since August.  She says she will get dyspneic  and tired in her chest walking at level ground.  She says she is  particularly bothered by stairs.  She stops what she is doing and her  symptoms recover.  She does not have any problems lying flat, and does  not get any PND.  She does not report any chest pressure, neck or arm  discomfort.  She does not have any palpitations, presyncope or syncope.   Today she was scheduled for a stress test.  Her baseline demonstrates a  left bundle branch block which is new compared to her previous.  She  also looks like she has left atrial enlargement.  She had a hypertensive  response with a pressure peaking at 210/110 after only 3-1/2 minutes of  exercise.  She only exercised into stage 1 before the test was  terminated.  She did have an accelerated blood pressure response with a  heart rate of 142.  However, with the baseline EKG abnormalities, we  cannot interpret any ST segment changes.  She did have ectopy.   PAST MEDICAL HISTORY:  1. Hepatitis B.  2. Low vitamin D, recently diagnosed.  3.  Osteoarthritis.   PAST SURGICAL HISTORY:  1. Appendectomy.  2. Hysterectomy.  3. Cholecystectomy.  4. Knee surgery.   ALLERGIES:  None.   MEDICATIONS:  1. Boniva.  2. Lexapro.  3. Vitamin D.  4. Detrol LA.  5. Multivitamin.   SOCIAL HISTORY:  The patient is retired.  She is divorced and has 4  children.  She continues to smoke about 1 pack every 3 or 4 days.  She  did not tolerate Chantix.  She smoked for many years.  She does not  drink alcohol.   FAMILY HISTORY:  Contributory for her father dying of myocardial  infarction at age 11.   REVIEW OF SYSTEMS:  As stated in the HPI and positive for joint pain,  negative for other systems.   PHYSICAL EXAMINATION:  The patient is in no distress.  Blood pressure 160/84, heart rate 61 and regular, weight 138 pounds,  body mass index 23.  HEENT:  Eyelids unremarkable, pupils are equal, round, and reactive to  light, fundi not visualized.  Oral mucosa unremarkable.  NECK:  No jugular venous distension at 45 degrees, carotid upstroke  brisk and symmetrical.  No bruit.  No thyromegaly.  LYMPHATICS:  No cervical, axillary, inguinal adenopathy.  LUNGS:  Clear to auscultation bilaterally.  BACK:  No costovertebral angle tenderness.  CHEST:  Unremarkable.  HEART:  PMI not displaced or sustained.  S1 and S2 are within normal  limits.  No S3, no S4.  No clicks, no rubs.  There is 1/6 diastolic  murmur heard only in the left third intercostal space with the patient  seated completely forward and her breath completely exhaled.  ABDOMEN:  Flat, positive bowel sounds, normal in frequency and pitch.  No bruits, no rebound, no guarding.  No midline pulsatile mass.  No  hepatomegaly, no splenomegaly.  SKIN:  No rashes, no nodules.  EXTREMITIES:  2+ pulses throughout, no edema.  No cyanosis, no clubbing.  NEURO:  Oriented to person, place, and time.  Cranial nerves II-XII  grossly intact, motor grossly intact.   ASSESSMENT AND PLAN:  1.  Dyspnea:  The patient has had progressive dyspnea and some chest      tiredness.  This may be an anginal equivalent.  The stress test      was uninterpretable.  I am going to evaluate this with a followup      either stress test or catheterization which would be based on the      echo as described below.  2. Aortic insufficiency:  The patient has had progression of left      bundle branch block, and had moderate aortic insufficiency in the      past.  I am going to start with an echocardiogram to make sure she      has had no change in the degree of aortic insufficiency.  If this      is stable and does not explain her progressive dyspnea, we will go      ahead with the ischemia workup which will be a stress perfusion      study.  If, however, her aortic insufficiency has progressed (which      I would not suspect) to become severe enough, she may need cardiac      catheterization instead of a stress perfusion study.  3. Hypertension:  She has been hypertensive on multiple visits.  I am      going to go ahead and start her on hydrochlorothiazide 12.5 mg      daily.  I suspect she will need another agent.  4. Tobacco:  She has tried to quit smoking, and we talked about this.      She does not tolerate the Chantix.  We will continue to try to      educate her (greater than 3 minutes talking about this).  5. Followup will be after the results of the above.     Rollene Rotunda, MD, [facility]  Electronically Signed    JH/MedQ  DD: 04/16/2007  DT: 04/16/2007  Job #: [id number]   cc:   Birdena Jubilee, NP

## 2010-09-12 NOTE — Assessment & Plan Note (Signed)
[facility]                            CARDIOLOGY OFFICE NOTE   MODENA, BELLEMARE                      MRN:          [id number]  DATE:06/28/2008                            DOB:          1935/03/16    PRIMARY CARE PHYSICIAN:  Ernestina Penna, MD   REASON FOR PRESENTATION:  Evaluate the patient with dyspnea.   HISTORY OF PRESENT ILLNESS:  The patient presents for followup of the  above.  I did a cardiac catheterization in mid January to evaluate this  along with chest discomfort.  She had normal coronary arteries.  She had  normal EF.  She had normal right-sided pressures.  However, she still  gets dyspneic and describes this as being limiting difficulty.  She is  not able to have the quality of life that she would hope.  However, it  sounds somewhat intermittent.  She will get dyspneic sometimes taking  the trash cans out to the curb.  Sometimes, she would not get dyspneic.  She continues to smoke some cigarettes.  She could not tolerate Chantix.  She denies any PND or orthopnea.  She has had no chest discomfort, neck  or arm discomfort.  She is not describing any palpitations, presyncope,  or syncope.  She states when she rests, her breathing will get better.  It does not happen again at rest.  It seems to be a stable pattern.   PAST MEDICAL HISTORY:  1. Hypertension.  2. Hepatitis B.  3. Low vitamin D.  4. Osteoarthritis.  5. Appendectomy.  6. Hysterectomy.  7. Cholecystectomy.  8. Knee surgery.  9. Hemorrhoid surgery.  10.Foot surgery.   ALLERGIES/INTOLERANCES:  PENICILLIN and CODEINE.   MEDICATIONS:  1. Hydrochlorothiazide 12.5 mg daily.  2. Amitriptyline 25 mg daily.  3. Simvastatin 10 mg daily.  4. Lexapro 10 mg daily.  5. Aspirin.  6. Ergocalciferol.  7. Boniva.   REVIEW OF SYSTEMS:  As stated in the HPI and otherwise negative for  other systems.   PHYSICAL EXAMINATION:  GENERAL:  The patient is pleasant and in no  distress.  VITAL SIGNS:  Blood pressure 160/86 and heart rate 74 and regular.  HEENT:  Eyelids unremarkable; pupils are equal, round, and reactive to  light; fundi not visualized; oral mucosa unremarkable.  NECK:  No jugular venous distention at 45 degrees, carotid upstroke  brisk and symmetric, no bruits, no thyromegaly.  LYMPHATICS:  No cervical, axillary, or inguinal adenopathy.  LUNGS:  Clear to auscultation bilaterally.  BACK:  No costovertebral angle tenderness.  CHEST:  Unremarkable.  HEART:  PMI not displaced or sustained; S1 and S2 within normal limits,  no S3, no S4; no clicks, no rubs, no murmurs.  ABDOMEN:  Mildly obese; positive bowel sounds, normal in frequency and  pitch; no bruits, no rebound, no guarding, no midline pulsatile mass; no  hepatomegaly, no splenomegaly.  SKIN:  No rashes, no nodules.  EXTREMITIES:  Pulses 2+ throughout, no edema, no cyanosis, no clubbing.  NEURO:  Oriented to person, place, and time; cranial nerves II through  XII grossly intact; motor grossly  intact.   EKG:  Sinus rhythm, biatrial enlargement, intraventricular conduction  delay, no change from previous EKGs.   ASSESSMENT AND PLAN:  1. Dyspnea.  The patient still has significant dyspnea.  I am going to      get pulmonary function testing to further evaluate this.  At this      point, she will remain on the medications as listed with the      exception below.  2. Hypertension.  Her blood pressure is still elevated.  Some of her      dyspnea could be related to diastolic dysfunction, though the      pressures on her right heart cath did not support this.  At this      point, I will better control her blood pressure by adding      lisinopril to the HCTZ.  She is to take 10/12.5.  She will get a      BMET in 2 weeks when she comes back for her pulmonary function      testing.  3. Obesity.  She understands the need to lose weight with diet and      exercise.  4. Dyslipidemia.  She is on  10 mg of simvastatin.  I will defer to Dr.      Christell Constant for management of this.  5. Followup.  I will see the patient again in 1 month after she has      had a blood work and her pulmonary function testing.     Rollene Rotunda, MD, [facility]  Electronically Signed    JH/MedQ  DD: 06/28/2008  DT: 06/29/2008  Job #: [id number]   cc:   Ernestina Penna, M.D.

## 2010-09-12 NOTE — H&P (Signed)
[name], [name]               ACCOUNT NO.:  [id number]   MEDICAL RECORD NO.:  [id number]          PATIENT TYPE:  INP   LOCATION:  A316                          FACILITY:  [facility]   PHYSICIAN:  Dorris Singh, DO    DATE OF BIRTH:  05-Jan-1935   DATE OF ADMISSION:  05/25/2008  DATE OF DISCHARGE:  LH                              HISTORY & PHYSICAL   The patient's primary care physician is Dr. Christell Constant and Birdena Jubilee.   CHIEF COMPLAINT:  Nausea and vomiting.   The patient is a 76 year old African American female who presented to  the [facility] emergency room with the chief complaint of nausea and  vomiting.  She stated it started about 2:00 yesterday.  She denies  eating any different types of foods or foods that she was concerned  about.  She made a meal for an elderly couple and herself.  She did use  Spam.  She said it was within its regular date as well as canned green  beans, but she stated that she did not have any issue prior to it.  Cannot remember what meal she had before that day.  When she was seen in  the emergency room, she had vomited about 4 times and had crampy  abdominal pain.  She says that it is been associated with undigested  food as well.  She did have some bouts of diarrhea which she has not had  since she has been admitted into the hospital.   Her past medical history is significant for hypertension.   SURGICAL HISTORY:  She has had an appendectomy, cholecystectomy,  hysterectomy and tonsillectomy.   She is a smoker, about a pack a day, nondrinker and currently lives  alone.   Her medicines include:  1. Hydrochlorothiazide 12.5 once a day.  2. Amitriptyline 25 mg once a day.  3. Aspirin 1 tablet once a day.  4. Vitamin D 50,000 international units weekly specialized dosing.  5. Boniva 150 mg specialized dosing once a month.   REVIEW OF SYSTEMS:  CONSTITUTIONALLY:  Negative for fever and chills.  Positive for appetite change.  EYES:  Negative for eye  pain.  EARS,  NOSE, MOUTH, THROAT:  Negative for ear pain, sore throat or change in  smell.  CARDIOVASCULAR:  Negative for chest pain.  No palpitations.  RESPIRATORY:  Negative for cough, dyspnea, wheezing.  GASTROINTESTINAL:  Positive for nausea, vomiting, abdominal pain and diarrhea.  GENITOURINARY:  Negative for dysuria and flank pain.  MUSCULOSKELETAL:  Negative for arthralgias or back pain.  SKIN:  Negative for rash.  NEUROLOGICAL:  Negative for headache.  PSYCHIATRIC:  Negative for  depression.  HEMATOLOGIC:  Negative for anemia.   PHYSICAL EXAMINATION:  Is as follows, her vitals:  Blood pressure  153/73, pulse rate 85, respirations 22, temperature 97.9.  GENERAL:  The patient is 76 year old [place] American female who is well  developed, well nourished in no acute distress.  HEAD:  Is normocephalic, atraumatic.  EYES:  Are PERRL.  EOMI.  NOSE:  Turbinates are moist.  MOUTH:  Is clear.  NECK:  Supple.  No lymphadenopathy.  HEART:  Regular rate and rhythm.  LUNGS:  Decreased breath sounds but clear to auscultation bilaterally.  ABDOMEN:  Positive hyperactive bowel sounds.  It is soft, nondistended.  There is no tenderness right lower quadrant.  There is no organomegaly  noted.  EXTREMITIES:  Positive pulses.  No ecchymosis, cyanosis or edema.  NEUROLOGICAL:  Cranial II-XII grossly intact.  Good turgor, good  texture.  The patient is a pleasant.   TESTING:  That was done:  She had a one-view of the abdomen which shows  no acute findings.   Her labs, potassium 141, sodium 3.5, chloride 104, BUN 9, creatinine  1.2, glucose 109.  Her urine was clean.  There is no nitrates or  leukocyte esterase.   ASSESSMENT AND PLAN:  1. Unretractable nausea and vomiting.  2. Gastroenteritis.  3. Dehydration.  4. Diarrhea.   Plan will be to admit the patient to the service of [facility] to  observation and will start IV hydration as well as antiemetics.  Will  continue to monitor her and see  how she improves.  Will also get stool  cultures and basic labs in the morning.  If the patient is still having  problems, will maybe do some further imaging.  However, if she is  continuing to improve, we will advance her diet as she resolves.  Will  do DVT and GI prophylaxis.  Will continue to monitor and change  management as necessary.      Dorris Singh, DO  Electronically Signed     CB/MEDQ  D:  05/26/2008  T:  05/26/2008  Job:  [id number]

## 2010-09-12 NOTE — Assessment & Plan Note (Signed)
[facility]                            CARDIOLOGY OFFICE NOTE   [name], [name]                      MRN:          [id number]  DATE:11/24/2008                            DOB:          25-Feb-1935    PRIMARY CARE PHYSICIAN:  Ernestina Penna, M.D.   REASON FOR PRESENTATION:  Evaluate the patient with dyspnea.   HISTORY OF PRESENT ILLNESS:  The patient returns for followup.  At the  last visit, I was still evaluating dyspnea.  I had done a right and left  heart catheterization with no clear etiology identified.  She had normal  coronary arteries.  She was sent for pulmonary function testing.  I do  see these results in Dr. Kathi Der chart but do not see the  interpretation.  Turns out that she had to have somewhat urgent back  surgery following our last visit.  She feels much better in her low back  and legs since that time.  She also, during her convalescence, quit  smoking!  She says her breathing is better.  She denies any acute  shortness of breath and is getting around without significant  limitations.  She is not having any PND or orthopnea.  She has not have  any palpitations, presyncope, or syncope.   PAST MEDICAL HISTORY:  1. Hypertension.  2. Hepatitis B.  3. Low vitamin D.  4. Osteoarthritis.  5. Appendectomy.  6. Hysterectomy.  7. Cholecystectomy.  8. Knee surgery.  9. Hemorrhoid surgery.  10.Foot surgery.  11.Lumbar back surgery.   ALLERGIES AND INTOLERANCES:  PENICILLIN and CODEINE.   MEDICATIONS:  1. Hydrochlorothiazide 12.5 mg daily.  2. Amitriptyline 25 mg daily.  3. Simvastatin 10 mg daily.  4. Lexapro 10 mg daily.  5. Aspirin 81 mg daily.  6. Ergocalciferol.  7. Boniva.  8. Symbicort.  9. Furosemide 10 mg daily.  10.Allegra.  11.Calcium.   REVIEW OF SYSTEMS:  As stated in the HPI, otherwise negative for all  other systems.   PHYSICAL EXAMINATION:  GENERAL:  The patient is pleasant, in no  distress.  VITAL  SIGNS:  Blood pressure 132/88, heart rate 71 and regular, weight  155 pounds, body mass index 27.  NECK:  No jugular venous distention at 45 degrees.  Carotid upstroke are  brisk and symmetric.  No bruits.  No thyromegaly.  LYMPHATICS:  No adenopathy.  LUNGS:  Clear to auscultation bilaterally.  BACK:  No costovertebral angle tenderness.  CHEST:  Unremarkable.  HEART:  PMI not displaced or sustained.  S1 and S2 within normal limits.  No S3, no S4, no clicks, no rubs, no murmurs.  ABDOMEN:  Mildly obese, positive bowel sounds, normal in frequency and  pitch, no bruits, no rebound, no guarding, no midline pulsatile mass, no  organomegaly.  SKIN:  No rashes, no nodules.  EXTREMITIES:  Pulses 2+, no edema.   EKG sinus rhythm, rate 71, axis in the right superior, interventricular  conduction delay, no change from previous except the right axis  deviation.  This is almost definitely related to limb lead reversal.   ASSESSMENT  AND PLAN:  1. Dyspnea.  This is no longer a significant problem.  No further      cardiac workup is suggested..  Stopping smoking was probably the      most beneficial thing.  2. Hypertension.  Blood pressure is controlled today.  She did start      the lisinopril that I had suggested.  However, her blood pressure      has been well controlled in other visits.  Recently, as I reviewed      her primary care office chart.  Therefore, no further medication      changes is warranted.  She will continue with the meds as listed.  3. Followup.  I will see her back as needed.     Rollene Rotunda, MD, [facility]  Electronically Signed    JH/MedQ  DD: 11/24/2008  DT: 11/25/2008  Job #: [id number]   cc:   Ernestina Penna, M.D.

## 2010-09-12 NOTE — Op Note (Signed)
[name], [name]               ACCOUNT NO.:  [id number]   MEDICAL RECORD NO.:  [id number]          PATIENT TYPE:  INP   LOCATION:  3003                         FACILITY:  [facility]   PHYSICIAN:  Danae Orleans. Venetia Maxon, M.D.  DATE OF BIRTH:  [birth year]   DATE OF PROCEDURE:  07/20/2008  DATE OF DISCHARGE:                               OPERATIVE REPORT   PREOPERATIVE DIAGNOSES:  Scoliosis, spondylolisthesis, stenosis, lumbar  radiculopathy, L4-5 level.   POSTOPERATIVE DIAGNOSES:  Scoliosis, spondylolisthesis, stenosis, lumbar  radiculopathy, L4-5 level.   PROCEDURE:  1. L4-L5 bilateral decompressive laminectomy more extensive than for      posterior lumbar interbody fusion.  2. Posterior lumbar interbody fusion with PEEK interbody cages,      morcellized bone autograft, BMP, and EquivaBone.  3. Nonsegmental pedicle screw fixation, L4-L5 bilaterally.  4. Posterolateral arthrodesis, L4-L5 bilaterally.   SURGEON:  Danae Orleans. Venetia Maxon, MD   ASSISTANTS:  1. Georgiann Cocker, RN  2. Hewitt Shorts, MD   ANESTHESIA:  General endotracheal anesthesia.   ESTIMATED BLOOD LOSS:  300 mL.   COMPLICATIONS:  None.   DISPOSITION:  To recovery.   INDICATIONS:  [name] is a 76 year old woman with severe lumbar  radiculopathy with spondylolisthesis and scoliosis at L4-5 with severe  right L4 foraminal nerve root compression.  It was elected to take her  to surgery for decompression and fusion at this affected level.   PROCEDURE:  [name] was brought to the operating room.  Following  satisfactory and uncomplicated induction of general endotracheal  anesthesia and placement of intravenous lines and Foley catheter, she  was placed in a prone position on the [place] table.  Her soft tissue  and bony prominences were padded appropriately.  Her low back was then  prepped and draped in usual sterile fashion.  The area of planned  incision was infiltrated with local lidocaine.  An incision was made  in  the midline and carried through the lumbodorsal fascia which incised  bilaterally.  Subperiosteal dissection was performed exposing the L4 and  L5 transverse processes and self-retaining retractor was placed to  facilitate exposure.  Intraoperative x-ray confirmed correct  orientation.  Subsequently, hemilaminectomy of L4 was performed  bilaterally and this was carried extensively both centrally as well as  laterally decompressing the thecal sac and L4 and L5 nerve roots  bilaterally.  The right L4 nerve root was significantly compressed due  to scoliosis and disk herniation and the L4-5 interspace was then  incised.  Disk material was removed in piecemeal fashion and endplates  were stripped of residual disk material.  In preparation for placement  of interbody cages, a thorough diskectomy was performed bilaterally.  Subsequently, after trial sizing, 9-mm PEEK interbody cages were  selected, packed with BMP and EquivaBone, inserted in the interspace,  and countersunk appropriately.  Additional BMP and bone autograft were  placed deep to the cages and also after the cages had been placed and  tamped into position.  Subsequently, the pedicle screw fixation was then  placed using 45 x 5.5 mm screws, 2 at L4 and  2 at L5.  The right L4  screw was felt to be laterally placed on AP fluoroscopic image and this  was then moved medially with better positioning on AP and lateral  fluoroscopy.  No evidence any cutouts.  A 30-mm rod was placed on the  right and 40-mm rod was placed on the left.  Screws were locked down in  situ.  Remaining BMP and EquivaBone and autograft were placed in the  right posterolateral region and tamped into position.  The self-  retaining retractor was removed.  The lumbodorsal fascia was closed with  1 Vicryl suture.  Prior to doing so, wound was irrigated, then closed  with 1 Vicryl sutures, subcutaneous tissues were approximated 2-0 Vicryl  interrupted inverted  sutures, and skin edges were reapproximated with  interrupted 3-0 Vicryl subcuticular stitch.  Wound was dressed with  Benzoin, Steri-Strips, Telfa gauze, and tape.  The patient was extubated  in the operating room and taken to recovery in stable and satisfactory  condition, having tolerated the operation well.  Counts were correct at  the end of the case.      Danae Orleans. Venetia Maxon, M.D.  Electronically Signed     JDS/MEDQ  D:  07/20/2008  T:  07/21/2008  Job:  [id number]

## 2010-09-12 NOTE — Cardiovascular Report (Signed)
[name], [name]               ACCOUNT NO.:  [id number]   MEDICAL RECORD NO.:  [id number]          PATIENT TYPE:  OIB   LOCATION:  2899                         FACILITY:  [facility]   PHYSICIAN:  Rollene Rotunda, MD, FACCDATE OF BIRTH:  27-Sep-1934   DATE OF PROCEDURE:  05/11/2008  DATE OF DISCHARGE:                            CARDIAC CATHETERIZATION   PRIMARY CARE PHYSICIAN:  Ernestina Penna, MD   PROCEDURE:  Left and right heart catheterization/coronary arteriography.   INDICATIONS:  Evaluate the patient with dyspnea and chest discomfort.   PROCEDURE NOTE:  Left heart catheterization was performed via the right  femoral artery, right heart catheterization performed via the right  femoral vein.  Both vessels were cannulated using the anterior wall  puncture.  A #5-French arterial sheath and #7-French venous sheaths were  inserted via the modified Seldinger technique.  Preformed Judkins and  pigtail catheter were utilized.  The patient tolerated the procedure  well and left the lab in stable condition.  I will see uses a Swan-Ganz  catheter.   RESULTS:  Hemodynamics; RA mean 2, RV 28/0, PA 32/7 with a mean of 17,  pulmonary capillary wedge pressure mean 7, LV 171/12, AO 175/86, cardiac  output/cardiac index (thermodilution) 5.25/2.97.  Coronary arteries,  left main was normal.  The LAD was large wrapping the apex and was  normal throughout its course.  First and second diagonal were small and  normal.  Circumflex in the AV groove was normal.  The ramus intermediate  was moderate size and normal.  Mid obtuse marginal 1 was large and  normal.  Mid obtuse marginal 2 small and normal.  The right coronary  artery was a dominant and normal vessel.  PDA was moderate sized and  normal.  Left ventriculogram:  The left ventriculogram was obtained in the RAO  projection.  The EF was 60% with normal wall motion.   CONCLUSION:  Normal coronary arteries.  Normal left ventricular  function.   Normal right heart pressures.   PLAN:  The patient has progressive and significant dyspnea.  I will  consider pulmonary function testing versus cardiopulmonary stress  testing.  She will be enrolled in the stop smoking class.      Rollene Rotunda, MD, [facility]  Electronically Signed     JH/MEDQ  D:  05/11/2008  T:  05/11/2008  Job:  [id number]   cc:   Ernestina Penna, M.D.

## 2010-12-11 ENCOUNTER — Encounter: Payer: Self-pay | Admitting: Cardiology

## 2011-01-01 ENCOUNTER — Encounter (HOSPITAL_COMMUNITY): Payer: Self-pay | Admitting: Emergency Medicine

## 2011-01-01 ENCOUNTER — Emergency Department (HOSPITAL_COMMUNITY): Payer: Medicare Other

## 2011-01-01 ENCOUNTER — Emergency Department (HOSPITAL_COMMUNITY)
Admission: EM | Admit: 2011-01-01 | Discharge: 2011-01-01 | Disposition: A | Discharge status: Home / Self Care | Payer: Medicare Other | Attending: Emergency Medicine | Admitting: Emergency Medicine

## 2011-01-01 ENCOUNTER — Other Ambulatory Visit: Payer: Self-pay

## 2011-01-01 DIAGNOSIS — R059 Cough, unspecified: Secondary | ICD-10-CM | POA: Insufficient documentation

## 2011-01-01 DIAGNOSIS — Z87891 Personal history of nicotine dependence: Secondary | ICD-10-CM | POA: Insufficient documentation

## 2011-01-01 DIAGNOSIS — R05 Cough: Secondary | ICD-10-CM | POA: Insufficient documentation

## 2011-01-01 DIAGNOSIS — R071 Chest pain on breathing: Secondary | ICD-10-CM | POA: Insufficient documentation

## 2011-01-01 DIAGNOSIS — M940 Chondrocostal junction syndrome [Tietze]: Secondary | ICD-10-CM | POA: Insufficient documentation

## 2011-01-01 LAB — CBC
HCT: 38.2 % (ref 36.0–46.0)
Hemoglobin: 12.9 g/dL (ref 12.0–15.0)
MCH: 30.1 pg (ref 26.0–34.0)
RBC: 4.29 MIL/uL (ref 3.87–5.11)

## 2011-01-01 LAB — BASIC METABOLIC PANEL
BUN: 14 mg/dL (ref 6–23)
Chloride: 104 mEq/L (ref 96–112)
Creatinine, Ser: 0.92 mg/dL (ref 0.50–1.10)
Glucose, Bld: 93 mg/dL (ref 70–99)
Potassium: 3.4 mEq/L — ABNORMAL LOW (ref 3.5–5.1)

## 2011-01-01 LAB — DIFFERENTIAL
Eosinophils Absolute: 0.2 10*3/uL (ref 0.0–0.7)
Lymphs Abs: 2.2 10*3/uL (ref 0.7–4.0)
Monocytes Absolute: 0.3 10*3/uL (ref 0.1–1.0)
Monocytes Relative: 4 % (ref 3–12)
Neutro Abs: 4.1 10*3/uL (ref 1.7–7.7)
Neutrophils Relative %: 60 % (ref 43–77)

## 2011-01-01 LAB — CARDIAC PANEL(CRET KIN+CKTOT+MB+TROPI)
Relative Index: 1.7 (ref 0.0–2.5)
Troponin I: <0.3 ng/mL (ref <0.30)

## 2011-01-01 MED ORDER — HYDROCODONE-ACETAMINOPHEN 5-325 MG PO TABS: 1.0000 | ORAL_TABLET | ORAL | Status: AC | PRN

## 2011-01-01 MED ORDER — BENZONATATE 100 MG PO CAPS: 100.0000 mg | ORAL_CAPSULE | Freq: Three times a day (TID) | ORAL | Status: AC | PRN

## 2011-01-01 MED ORDER — AZITHROMYCIN 250 MG PO TABS
500.0000 mg | ORAL_TABLET | Freq: Once | ORAL | Status: AC
  Administered 2011-01-01: 500 mg via ORAL
  Filled 2011-01-01: qty 2

## 2011-01-01 MED ORDER — MORPHINE SULFATE 2 MG/ML IJ SOLN
INTRAMUSCULAR | Status: AC
  Filled 2011-01-01: qty 1

## 2011-01-01 MED ORDER — ONDANSETRON HCL 4 MG/2ML IJ SOLN
4.0000 mg | Freq: Once | INTRAMUSCULAR | Status: AC
  Administered 2011-01-01: 4 mg via INTRAVENOUS
  Filled 2011-01-01: qty 2

## 2011-01-01 MED ORDER — IOHEXOL 350 MG/ML SOLN
100.0000 mL | Freq: Once | INTRAVENOUS | Status: AC | PRN
  Administered 2011-01-01: 100 mL via INTRAVENOUS

## 2011-01-01 MED ORDER — MORPHINE SULFATE 4 MG/ML IJ SOLN
2.0000 mg | Freq: Once | INTRAMUSCULAR | Status: AC
  Administered 2011-01-01: 2 mg via INTRAVENOUS

## 2011-01-01 NOTE — ED Notes (Signed)
Patient to xray at this time

## 2011-01-01 NOTE — ED Notes (Signed)
Patient returned from CT at this time. NAD noted.

## 2011-01-01 NOTE — ED Notes (Signed)
Patient reports right sided chest pain that is worse with inspiration. Symptoms started Wednesday of last week. Patient reports cough. Denies congestion. Denies SOB.

## 2011-01-01 NOTE — ED Notes (Signed)
Patient with no complaints at this time. Respirations even and unlabored. Skin warm/dry. Discharge instructions reviewed with patient at this time. Patient given opportunity to voice concerns/ask questions. IV removed per policy and band-aid applied to site. Patient discharged at this time and left Emergency Department with steady gait.  

## 2011-01-01 NOTE — ED Provider Notes (Addendum)
History     CSN: [id number] Arrival date & time: 01/01/2011  8:21 AM  Chief Complaint  Patient presents with  . Chest Pain   Patient is a 75 y.o. female presenting with chest pain. The history is provided by the patient.  Chest Pain The chest pain began 3 - 5 days ago. Chest pain occurs constantly. The chest pain is unchanged. The pain is associated with breathing and lifting (bending and raising right arm makes pain worse.). At its most intense, the pain is at 10/10. The pain is currently at 3/10. The quality of the pain is described as pleuritic and sharp. The pain does not radiate. Chest pain is worsened by certain positions and deep breathing. Primary symptoms include cough. Pertinent negatives for primary symptoms include no fever, no shortness of breath, no wheezing, no palpitations, no abdominal pain, no nausea, no vomiting and no dizziness.  Pertinent negatives for associated symptoms include no diaphoresis, no lower extremity edema, no numbness, no orthopnea, no paroxysmal nocturnal dyspnea and no weakness. She tried aspirin for the symptoms. Risk factors include being elderly.  Her past medical history is significant for hypertension.     Past Medical History  Diagnosis Date  . Fluid retention   . Hypertension   . High cholesterol   . Depression   . Allergic rhinitis     Past Surgical History  Procedure Date  . Cholecystectomy 1991  . Abdominal hysterectomy     Many years ago  . Tonsillectomy   . Appendectomy   . Back surgery 07/21/2008    Family History  Problem Relation Age of Onset  . Colon cancer Neg Hx   . Diabetes Other   . Allergies Other     History  Substance Use Topics  . Smoking status: Former Smoker    Quit date: 08/28/2008  . Smokeless tobacco: Not on file   Comment: Smoked pack per week starting at age 5  . Alcohol Use: No    OB History    Grav Para Term Preterm Abortions TAB SAB Ect Mult Living                  Review of Systems    Constitutional: Negative for fever and diaphoresis.  HENT: Positive for facial swelling. Negative for congestion, sore throat and neck pain.   Eyes: Negative.   Respiratory: Positive for cough. Negative for chest tightness, shortness of breath and wheezing.   Cardiovascular: Positive for chest pain. Negative for palpitations, orthopnea and leg swelling.  Gastrointestinal: Negative for nausea, vomiting and abdominal pain.  Genitourinary: Negative.   Musculoskeletal: Negative for joint swelling and arthralgias.  Skin: Negative.  Negative for rash and wound.  Neurological: Negative for dizziness, weakness, light-headedness, numbness and headaches.  Hematological: Negative.   Psychiatric/Behavioral: Negative.     Physical Exam  BP 124/69  Pulse 64  Temp(Src) 98 F (36.7 C) (Oral)  Resp 18  Ht 5\' 5"  (1.651 m)  Wt 147 lb (66.679 kg)  BMI 24.46 kg/m2  SpO2 98%  Physical Exam  Nursing note and vitals reviewed. Constitutional: She is oriented to person, place, and time. She appears well-developed and well-nourished.  HENT:  Head: Normocephalic and atraumatic.  Eyes: Conjunctivae are normal.  Neck: Normal range of motion.  Cardiovascular: Normal rate, regular rhythm, normal heart sounds and intact distal pulses.   Pulmonary/Chest: Effort normal and breath sounds normal. She has no wheezes. She has no rales. She exhibits tenderness.    Abdominal: Soft. Bowel  sounds are normal. There is no tenderness.  Musculoskeletal: Normal range of motion.  Neurological: She is alert and oriented to person, place, and time.  Skin: Skin is warm and dry.  Psychiatric: She has a normal mood and affect.    ED Course  Procedures  MDM Chronic cough with new right sided pleuritic cp.  Patient has had multiple courses of abx this past year without relief of cough.  Will defer today,  Given no sob,  No fever or other sx suggesting infectious process.  Pain med,  Referral to Dr. Juanetta Gosling.  On lisinopril  -  ? Chronic cough induced by this med.    01/01/2011 @ 11:41 AM  Benny Lennert, MD to bedside for evaluation of patient Patient c/o chest pain onset several days ago and persistent since. Patient also notes she has been evaluated by PCP several times for cough with no relief from provided treatments. Patient informed of lab and imaging results.   Medical screening examination/treatment/procedure(s) were conducted as a shared visit with non-physician practitioner(s) and myself.  I personally evaluated the patient during the encounter Benny Lennert, MD    Candis Musa, PA 01/01/11 1215           Date: 01/12/2011  Rate: 61  Rhythm: normal sinus rhythm  QRS Axis: normal  Intervals: wide qrs  ST/T Wave abnormalities: nonspecific ST changes  Conduction Disutrbances:left bundle branch block  Narrative Interpretation:   Old EKG Reviewed: none available    Benny Lennert, MD 01/12/11 [phone]

## 2011-01-01 NOTE — ED Notes (Signed)
Patient requesting to speak with Burgess Amor, PA. Raynelle Fanning made aware and will be in to see patient shortly.   Patient ambulatory to restroom with steady gait. Returned to room and reattached to cardiac monitor as appropriate.

## 2011-01-01 NOTE — ED Notes (Addendum)
Dr. Estell Harpin at bedside. Patient in NAD

## 2011-01-01 NOTE — ED Notes (Signed)
Patient placed on continuous cardiac monitoring, continuous pulse oximetry, and NBP cycling q 1 hour.   

## 2011-01-01 NOTE — ED Notes (Signed)
Patient resting with eyes closed. NAD noted. Patient appears to be sleeping. Daughter remains at bedside. Will continue to monitor.

## 2011-01-02 NOTE — ED Provider Notes (Deleted)
Medical screening examination/treatment/procedure(s) were performed by non-physician practitioner and as supervising physician I was immediately available for consultation/collaboration.   Thyra Yinger L Sheppard Luckenbach, MD 01/02/11 1322 

## 2011-02-01 DIAGNOSIS — R05 Cough: Secondary | ICD-10-CM | POA: Insufficient documentation

## 2011-02-01 DIAGNOSIS — R059 Cough, unspecified: Secondary | ICD-10-CM | POA: Insufficient documentation

## 2011-03-19 ENCOUNTER — Other Ambulatory Visit: Payer: Self-pay | Admitting: Cardiology

## 2011-04-09 ENCOUNTER — Encounter (HOSPITAL_COMMUNITY): Payer: Self-pay | Admitting: Pharmacy Technician

## 2011-04-10 ENCOUNTER — Encounter (HOSPITAL_COMMUNITY)
Admission: RE | Admit: 2011-04-10 | Discharge: 2011-04-10 | Disposition: A | Discharge status: Home / Self Care | Payer: Medicare Other | Source: Ambulatory Visit | Attending: Ophthalmology | Admitting: Ophthalmology

## 2011-04-10 ENCOUNTER — Encounter (HOSPITAL_COMMUNITY): Payer: Self-pay

## 2011-04-10 HISTORY — DX: Gastro-esophageal reflux disease without esophagitis: K21.9

## 2011-04-10 LAB — CBC
MCH: 30 pg (ref 26.0–34.0)
MCHC: 33.4 g/dL (ref 30.0–36.0)
Platelets: 320 10*3/uL (ref 150–400)

## 2011-04-10 LAB — BASIC METABOLIC PANEL
BUN: 14 mg/dL (ref 6–23)
CO2: 31 mEq/L (ref 19–32)
Calcium: 10.2 mg/dL (ref 8.4–10.5)
Chloride: 104 mEq/L (ref 96–112)
Creatinine, Ser: 0.96 mg/dL (ref 0.50–1.10)
GFR calc Af Amer: 66 mL/min — ABNORMAL LOW (ref >90)
GFR calc non Af Amer: 57 mL/min — ABNORMAL LOW (ref >90)
Glucose, Bld: 103 mg/dL — ABNORMAL HIGH (ref 70–99)
Potassium: 4.1 mEq/L (ref 3.5–5.1)
Sodium: 142 mEq/L (ref 135–145)

## 2011-04-10 MED ORDER — CYCLOPENTOLATE-PHENYLEPHRINE 0.2-1 % OP SOLN
OPHTHALMIC | Status: AC
  Filled 2011-04-10: qty 2

## 2011-04-10 NOTE — Patient Instructions (Signed)
20 [name]  04/10/2011   Your procedure is scheduled on:  04/16/11  Report to [facility] at 06:15 AM.  Call this number if you have problems the morning of surgery: [phone]   Remember:   Do not eat food:After Midnight.  May have clear liquids:until Midnight .  Clear liquids include soda, tea, black coffee, apple or grape juice, broth.  Take these medicines the morning of surgery with A SIP OF WATER: Amitriptyline, Flexeril, Nexium, Allegra, Lisinopril-HCTZ and Ranitidine.  Also, take your inhaler Symbicort.   Do not wear jewelry, make-up or nail polish.  Do not wear lotions, powders, or perfumes. You may wear deodorant.  Do not shave 48 hours prior to surgery.  Do not bring valuables to the hospital.  Contacts, dentures or bridgework may not be worn into surgery.  Leave suitcase in the car. After surgery it may be brought to your room.  For patients admitted to the hospital, checkout time is 11:00 AM the day of discharge.   Patients discharged the day of surgery will not be allowed to drive home.  Name and phone number of your driver:   Special Instructions: N/A   Please read over the following fact sheets that you were given: Anesthesia Post-op Instructions    Cataract A cataract is a clouding of the lens of the eye. It is most often related to aging. A cataract is not a "film" over the surface of the eye. The lens is inside the eye and changes size of the pupil. The lens can enlarge to let more light enter the eye in dark environments and contract the size of the pupil to let in bright light. The lens is the part of the eye that helps focus light on the retina. The retina is the eye's light-sensitive layer. It is in the back of the eye that sends visual signals to the brain. In a normal eye, light passes through the lens and gets focused on the retina. To help produce a sharp image, the lens must remain clear. When a lens becomes cloudy, vision is compromised by the degree  and nature of the clouding. Certain cataracts make people more near-sighted as they develop, others increase glare, and all reduce vision to some degree or another. A cataract that is so dense that it becomes milky Afnan Emberton and a Karema Tocci opacity can be seen through the pupil. When the Kimyetta Flott color is seen, it is called a "mature" or "hyper-mature cataract." Such cataracts cause total blindness in the affected eye. The cataract must be removed to prevent damage to the eye itself. Some types of cataracts can cause a secondary disease of the eye, such as certain types of glaucoma. In the early stages, better lighting and eyeglasses may lessen vision problems caused by cataracts. At a certain point, surgery may be needed to improve vision. CAUSES   Aging. However, cataracts may occur at any age, even in newborns.   Certain drugs.   Trauma to the eye.   Certain diseases (such as diabetes).   Inherited or acquired medical syndromes.  SYMPTOMS   Gradual, progressive drop in vision in the affected eye. Cataracts may develop at different rates in each eye. Cataracts may even be in just one eye with the other unaffected.   Cataracts due to trauma may develop quickly, sometimes over a matter or days or even hours. The result is severe and rapid visual loss.  DIAGNOSIS  To detect a cataract, an eye doctor examines the  lens. A well developed cataract can be diagnosed without dilating the pupil. Early cataracts and others of a specific nature are best diagnosed with an exam of the eyes with the pupils dilated by drops. TREATMENT   For an early cataract, vision may improve by using different eyeglasses or stronger lighting.   If the above measures do not help, surgery is the only effective treatment. This treatment removes the cloudy lens and replaces it with a substitute lens (Intraocular lens, or IOL). Newly developed IOL technology allows the implanted lens to improve vision both at a distance and up close.  Discuss with your eye surgeon about the possibility of still needing glasses. Also discuss how visual coordination between both eyes will be affected.  A cataract needs to be removed only when vision loss interferes with your everyday activities such as driving, reading or watching TV. You and your eye doctor can make that decision together. In most cases, waiting until you are ready to have cataract surgery will not harm your eye. If you have cataracts in both eyes, only one should be removed at a time. This allows the operated eye to heal and be out of danger from serious problems (such as infection or poor wound healing) before having the other eye undergo surgery.  Sometimes, a cataract should be removed even if it does not cause problems with your vision. For example, a cataract should be removed if it prevents examination or treatment of another eye problem. Just as you cannot see out of the affected eye well, your doctor cannot see into your eye well through a cataract. The vast majority of people who have cataract surgery have better vision afterward. CATARACT REMOVAL There are two primary ways to remove a cataract. Your doctor can explain the differences and help determine which is best for you:  Phacoemulsification (small incision cataract surgery). This involves making a small cut (incision) on the edge of the clear, dome-shaped surface that covers the front of the eye (the cornea). An injection behind the eye or eye drops are given to make this a painless procedure. The doctor then inserts a tiny probe into the eye. This device emits ultrasound waves that soften and break up the cloudy center of the lens so it can be removed by suction. Most cataract surgery is done this way. The cuts are usually so small and performed in such a manner that often no sutures are needed to keep it closed.   Extracapsular surgery. Your doctor makes a slightly longer incision on the side of the cornea. The doctor  removes the hard center of the lens. The remainder of the lens is then removed by suction. In some cases, extremely fine sutures are needed which the doctor may, or may not remove in the office after the surgery.  When an IOL is implanted, it needs no care. It becomes a permanent part of your eye and cannot be seen or felt.  Some people cannot have an IOL. They may have problems during surgery, or maybe they have another eye disease. For these people, a soft contact lens may be suggested. If an IOL or contact lens cannot be used, very powerful and thick glasses are required after surgery. Since vision is very different through such thick glasses, it is important to have your doctor discuss the impact on your vision after any cataract surgery where there is no plan to implant an IOL. The normal lens of the eye is covered by a clear capsule.  Both phacoemulsification and extracapsular surgery require that the back surface of this lens capsule be left in place. This helps support IOLs and prevents the IOL from dislocating and falling back into the deeper interior of the eye. Right after surgery, and often permanently this "posterior capsule" remains clear. In some cases however, it can become cloudy, presenting the same type of visual compromise that the original cataract did since light is again obstructed as it passes through the clear IOL. This condition is often referred to as an "after-cataract." Fortunately, after-cataracts are easily treated using a painless and very fast laser treatment that is performed without anesthesia or incisions. It is done in a matter of minutes in an outpatient environment. Visual improvement is often immediate.  HOME CARE INSTRUCTIONS   Your surgeon will discuss pre and post operative care with you prior to surgery. The majority of people are able to do almost all normal activities right away. Although, it is often advised to avoid strenuous activity for a period of time.    Postoperative drops and careful avoidance of infection will be needed. Many surgeons suggest the use of a protective shield during the first few days after surgery.   There is a very small incidence of complication from modern cataract surgery, but it can happen. Infection that spreads to the inside of the eye (endophthalmitis) can result in total visual loss and even loss of the eye itself. In extremely rare instances, the inflammation of endophthalmitis can spread to both eyes (sympathetic ophthalmia). Appropriate post-operative care under the close observation of your surgeon is essential to a successful outcome.  SEEK IMMEDIATE MEDICAL CARE IF:   You have any sudden drop of vision in the operated eye.   You have pain in the operated eye.   You see a large number of floating dots in the field of vision in the operated eye.   You see flashing lights, or if a portion of your side vision in any direction appears black (like a curtain being drawn into your field of vision) in the operated eye.  Document Released: 04/16/2005 Document Revised: 12/27/2010 Document Reviewed: 06/02/2007 [facility] Patient Information 2012 [place], [place].   PATIENT INSTRUCTIONS POST-ANESTHESIA  IMMEDIATELY FOLLOWING SURGERY:  Do not drive or operate machinery for the first twenty four hours after surgery.  Do not make any important decisions for twenty four hours after surgery or while taking narcotic pain medications or sedatives.  If you develop intractable nausea and vomiting or a severe headache please notify your doctor immediately.  FOLLOW-UP:  Please make an appointment with your surgeon as instructed. You do not need to follow up with anesthesia unless specifically instructed to do so.  WOUND CARE INSTRUCTIONS (if applicable):  Keep a dry clean dressing on the anesthesia/puncture wound site if there is drainage.  Once the wound has quit draining you may leave it open to air.  Generally you should leave the  bandage intact for twenty four hours unless there is drainage.  If the epidural site drains for more than 36-48 hours please call the anesthesia department.  QUESTIONS?:  Please feel free to call your physician or the hospital operator if you have any questions, and they will be happy to assist you.     [facility] Anesthesia Department [address] [place] [phone]

## 2011-04-16 ENCOUNTER — Ambulatory Visit (HOSPITAL_COMMUNITY): Payer: Medicare Other | Admitting: Anesthesiology

## 2011-04-16 ENCOUNTER — Encounter (HOSPITAL_COMMUNITY)
Admission: RE | Disposition: A | Discharge status: Home / Self Care | Payer: Self-pay | Source: Ambulatory Visit | Attending: Ophthalmology

## 2011-04-16 ENCOUNTER — Encounter (HOSPITAL_COMMUNITY): Payer: Self-pay | Admitting: *Deleted

## 2011-04-16 ENCOUNTER — Encounter (HOSPITAL_COMMUNITY): Payer: Self-pay | Admitting: Anesthesiology

## 2011-04-16 ENCOUNTER — Ambulatory Visit (HOSPITAL_COMMUNITY)
Admission: RE | Admit: 2011-04-16 | Discharge: 2011-04-16 | Disposition: A | Discharge status: Home / Self Care | Payer: Medicare Other | Source: Ambulatory Visit | Attending: Ophthalmology | Admitting: Ophthalmology

## 2011-04-16 DIAGNOSIS — Z01812 Encounter for preprocedural laboratory examination: Secondary | ICD-10-CM | POA: Insufficient documentation

## 2011-04-16 DIAGNOSIS — Z79899 Other long term (current) drug therapy: Secondary | ICD-10-CM | POA: Insufficient documentation

## 2011-04-16 DIAGNOSIS — H2510 Age-related nuclear cataract, unspecified eye: Secondary | ICD-10-CM | POA: Insufficient documentation

## 2011-04-16 DIAGNOSIS — I10 Essential (primary) hypertension: Secondary | ICD-10-CM | POA: Insufficient documentation

## 2011-04-16 HISTORY — PX: CATARACT EXTRACTION W/PHACO: SHX586

## 2011-04-16 SURGERY — PHACOEMULSIFICATION, CATARACT, WITH IOL INSERTION
Anesthesia: Monitor Anesthesia Care | Site: Eye | Laterality: Left | Wound class: Clean

## 2011-04-16 MED ORDER — EPINEPHRINE HCL 1 MG/ML IJ SOLN
INTRAMUSCULAR | Status: AC
  Filled 2011-04-16: qty 1

## 2011-04-16 MED ORDER — LIDOCAINE 3.5 % OP GEL OPTIME - NO CHARGE
OPHTHALMIC | Status: DC | PRN
  Administered 2011-04-16: 2 [drp] via OPHTHALMIC

## 2011-04-16 MED ORDER — LIDOCAINE HCL 3.5 % OP GEL
1.0000 "application " | Freq: Once | OPHTHALMIC | Status: AC
  Administered 2011-04-16: 1 via OPHTHALMIC

## 2011-04-16 MED ORDER — EPINEPHRINE HCL 1 MG/ML IJ SOLN
INTRAOCULAR | Status: DC | PRN
  Administered 2011-04-16: 08:00:00

## 2011-04-16 MED ORDER — GATIFLOXACIN 0.5 % OP SOLN OPTIME - NO CHARGE
1.0000 [drp] | Freq: Once | OPHTHALMIC | Status: AC
  Administered 2011-04-16: 1 [drp] via OPHTHALMIC
  Filled 2011-04-16: qty 2.5

## 2011-04-16 MED ORDER — TETRACAINE HCL 0.5 % OP SOLN
OPHTHALMIC | Status: AC
  Administered 2011-04-16: 1 [drp] via OPHTHALMIC
  Filled 2011-04-16: qty 2

## 2011-04-16 MED ORDER — GATIFLOXACIN 0.5 % OP SOLN OPTIME - NO CHARGE
OPHTHALMIC | Status: DC | PRN
  Administered 2011-04-16: 2 [drp] via OPHTHALMIC

## 2011-04-16 MED ORDER — TETRACAINE HCL 0.5 % OP SOLN
1.0000 [drp] | OPHTHALMIC | Status: AC
  Administered 2011-04-16 (×3): 1 [drp] via OPHTHALMIC

## 2011-04-16 MED ORDER — LACTATED RINGERS IV SOLN
INTRAVENOUS | Status: DC
  Administered 2011-04-16: 1000 mL via INTRAVENOUS

## 2011-04-16 MED ORDER — TETRACAINE 0.5 % OP SOLN OPTIME - NO CHARGE
OPHTHALMIC | Status: DC | PRN
  Administered 2011-04-16: 1 [drp] via OPHTHALMIC

## 2011-04-16 MED ORDER — NA HYALUR & NA CHOND-NA HYALUR 0.55-0.5 ML IO KIT
PACK | INTRAOCULAR | Status: DC | PRN
  Administered 2011-04-16: 1 via OPHTHALMIC

## 2011-04-16 MED ORDER — MIDAZOLAM HCL 2 MG/2ML IJ SOLN
1.0000 mg | INTRAMUSCULAR | Status: DC | PRN
  Administered 2011-04-16: 2 mg via INTRAVENOUS

## 2011-04-16 MED ORDER — CYCLOPENTOLATE-PHENYLEPHRINE 0.2-1 % OP SOLN
1.0000 [drp] | OPHTHALMIC | Status: AC
  Administered 2011-04-16 (×3): 1 [drp] via OPHTHALMIC

## 2011-04-16 MED ORDER — KETOROLAC TROMETHAMINE 0.4 % OP SOLN - NO CHARGE
1.0000 [drp] | Freq: Once | OPHTHALMIC | Status: AC
  Administered 2011-04-16: 1 [drp] via OPHTHALMIC
  Filled 2011-04-16: qty 5

## 2011-04-16 MED ORDER — BSS IO SOLN
INTRAOCULAR | Status: DC | PRN
  Administered 2011-04-16: 15 mL via INTRAOCULAR

## 2011-04-16 MED ORDER — LIDOCAINE HCL 3.5 % OP GEL
OPHTHALMIC | Status: AC
  Administered 2011-04-16: 1 via OPHTHALMIC
  Filled 2011-04-16: qty 5

## 2011-04-16 MED ORDER — MIDAZOLAM HCL 2 MG/2ML IJ SOLN
INTRAMUSCULAR | Status: AC
  Filled 2011-04-16: qty 2

## 2011-04-16 SURGICAL SUPPLY — 27 items
CAPSULAR TENSION RING-AMO (OPHTHALMIC RELATED) IMPLANT
CLOTH BEACON ORANGE TIMEOUT ST (SAFETY) ×2 IMPLANT
GLOVE BIO SURGEON STRL SZ7.5 (GLOVE) IMPLANT
GLOVE BIOGEL M 6.5 STRL (GLOVE) IMPLANT
GLOVE BIOGEL PI IND STRL 6.5 (GLOVE) ×1 IMPLANT
GLOVE BIOGEL PI IND STRL 7.0 (GLOVE) IMPLANT
GLOVE BIOGEL PI INDICATOR 6.5 (GLOVE) ×1
GLOVE BIOGEL PI INDICATOR 7.0 (GLOVE)
GLOVE ECLIPSE 6.5 STRL STRAW (GLOVE) IMPLANT
GLOVE ECLIPSE 7.5 STRL STRAW (GLOVE) IMPLANT
GLOVE EXAM NITRILE LRG STRL (GLOVE) IMPLANT
GLOVE EXAM NITRILE MD LF STRL (GLOVE) ×2 IMPLANT
GLOVE SKINSENSE NS SZ6.5 (GLOVE)
GLOVE SKINSENSE NS SZ7.0 (GLOVE)
GLOVE SKINSENSE STRL SZ6.5 (GLOVE) IMPLANT
GLOVE SKINSENSE STRL SZ7.0 (GLOVE) IMPLANT
INST SET CATARACT ~~LOC~~ (KITS) ×2 IMPLANT
KIT VITRECTOMY (OPHTHALMIC RELATED) IMPLANT
PAD ARMBOARD 7.5X6 YLW CONV (MISCELLANEOUS) ×2 IMPLANT
PROC W NO LENS (INTRAOCULAR LENS)
PROC W SPEC LENS (INTRAOCULAR LENS)
PROCESS W NO LENS (INTRAOCULAR LENS) IMPLANT
PROCESS W SPEC LENS (INTRAOCULAR LENS) IMPLANT
RING MALYGIN (MISCELLANEOUS) IMPLANT
SIGHTPATH CAT PROC W REG LENS (Ophthalmic Related) ×2 IMPLANT
VISCOELASTIC ADDITIONAL (OPHTHALMIC RELATED) IMPLANT
WATER STERILE IRR 250ML POUR (IV SOLUTION) ×2 IMPLANT

## 2011-04-16 NOTE — Brief Op Note (Signed)
04/16/2011  8:36 AM  PATIENT:  [name]  76 y.o. female  PRE-OPERATIVE DIAGNOSIS:  Surgical cataract left eye  POST-OPERATIVE DIAGNOSIS:  Surgical Cataract Left Eye  PROCEDURE:  Procedure(s): CATARACT EXTRACTION PHACO AND INTRAOCULAR LENS PLACEMENT (IOC), left eye  SURGEON:  Surgeon(s): Susa Simmonds  ASSISTANTS:  Marya Landry, CST  ANESTHESIA STAFF: Despina Hidden - CRNA Laurene Footman, MD - Anesthesiologist  ANESTHESIA:   topical/mac  REQUESTED LENS POWER: 26.5  LENS IMPLANT INFORMATION: @ORIMPLANT @  CUMULATIVE DISSIPATED ENERGY:11.35  INDICATIONS:decreased visual acuity interfering with daily activities  OP FINDINGS:dense NS OS  COMPLICATIONS:None  DICTATION #: see scanned op note generated on another system  PLAN OF CARE: see office notes scanned into chart  PATIENT DISPOSITION:  Short Stay

## 2011-04-16 NOTE — H&P (Signed)
Pt interviewed and examined.  There are no changes noted.

## 2011-04-16 NOTE — Anesthesia Preprocedure Evaluation (Addendum)
Anesthesia Evaluation  Patient identified by MRN, date of birth, ID band Patient awake    Reviewed: Allergy & Precautions, H&P , NPO status , Patient's Chart, lab work & pertinent test results, reviewed documented beta blocker date and time   History of Anesthesia Complications Negative for: history of anesthetic complications  Airway Mallampati: I      Dental  (+) Edentulous Upper and Edentulous Lower   Pulmonary Current Smoker,    Pulmonary exam normal       Cardiovascular hypertension, Pt. on medications Regular Normal    Neuro/Psych Depression    GI/Hepatic GERD-  Medicated and Controlled,(+) Hepatitis - (pt not aware of hx), B  Endo/Other    Renal/GU      Musculoskeletal   Abdominal   Peds  Hematology   Anesthesia Other Findings   Reproductive/Obstetrics                          Anesthesia Physical Anesthesia Plan  ASA: III  Anesthesia Plan: MAC   Post-op Pain Management:    Induction: Intravenous  Airway Management Planned: Nasal Cannula  Additional Equipment:   Intra-op Plan:   Post-operative Plan:   Informed Consent: I have reviewed the patients History and Physical, chart, labs and discussed the procedure including the risks, benefits and alternatives for the proposed anesthesia with the patient or authorized representative who has indicated his/her understanding and acceptance.     Plan Discussed with:   Anesthesia Plan Comments:         Anesthesia Quick Evaluation

## 2011-04-16 NOTE — Anesthesia Postprocedure Evaluation (Signed)
  Anesthesia Post-op Note  Patient: [name]  Procedure(s) Performed:  CATARACT EXTRACTION PHACO AND INTRAOCULAR LENS PLACEMENT (IOC) - CDE=11.35  Patient Location: PACU and Short Stay  Anesthesia Type: MAC  Level of Consciousness: awake, alert , oriented and patient cooperative  Airway and Oxygen Therapy: Patient Spontanous Breathing  Post-op Pain: none  Post-op Assessment: Post-op Vital signs reviewed, Patient's Cardiovascular Status Stable, Respiratory Function Stable, Patent Airway and No signs of Nausea or vomiting  Post-op Vital Signs: Reviewed and stable  Complications: No apparent anesthesia complications

## 2011-04-16 NOTE — Transfer of Care (Signed)
Immediate Anesthesia Transfer of Care Note  Patient: [name]  Procedure(s) Performed:  CATARACT EXTRACTION PHACO AND INTRAOCULAR LENS PLACEMENT (IOC) - CDE=11.35  Patient Location: PACU and Short Stay  Anesthesia Type: MAC  Level of Consciousness: awake, alert , oriented and patient cooperative  Airway & Oxygen Therapy: Patient Spontanous Breathing  Post-op Assessment: Report given to PACU RN, Post -op Vital signs reviewed and stable and Patient moving all extremities  Post vital signs: Reviewed and stable  Complications: No apparent anesthesia complications

## 2011-04-16 NOTE — Op Note (Signed)
See scanned op note generated in another system.

## 2011-04-19 ENCOUNTER — Encounter (HOSPITAL_COMMUNITY): Payer: Self-pay | Admitting: Ophthalmology

## 2011-05-21 ENCOUNTER — Ambulatory Visit: Payer: Medicare Other | Admitting: Gastroenterology

## 2011-06-08 ENCOUNTER — Ambulatory Visit: Payer: Medicare Other | Admitting: Gastroenterology

## 2011-06-13 ENCOUNTER — Ambulatory Visit: Payer: Medicare Other | Admitting: Gastroenterology

## 2011-06-19 ENCOUNTER — Ambulatory Visit (INDEPENDENT_AMBULATORY_CARE_PROVIDER_SITE_OTHER): Payer: Medicare Other | Admitting: Gastroenterology

## 2011-06-19 ENCOUNTER — Encounter: Payer: Self-pay | Admitting: Gastroenterology

## 2011-06-19 VITALS — BP 142/80 | HR 64 | Ht 64.0 in | Wt 146.8 lb

## 2011-06-19 DIAGNOSIS — R059 Cough, unspecified: Secondary | ICD-10-CM

## 2011-06-19 DIAGNOSIS — R05 Cough: Secondary | ICD-10-CM

## 2011-06-19 MED ORDER — ESOMEPRAZOLE MAGNESIUM 40 MG PO CPDR: 40.0000 mg | DELAYED_RELEASE_CAPSULE | Freq: Every day | ORAL | Status: DC

## 2011-06-19 NOTE — Progress Notes (Signed)
Review of pertinent gastrointestinal problems: 1. routine risk for colon cancer, colonoscopy June 2009 showed severe diverticulosis but no polyps. Recommended colonoscopy recall at 10 year interval 2. presumed gastroenteritis 2010, admitted to outside hospital with acute abdominal symptoms, nausea, vomiting. CT scan suggested small bowel inflammation, minor. Her symptoms resolved completely.   HPI: This is a  very pleasant 77 year old woman whom I last saw 2-3 years ago.   She had lip swelling recently.  Went to ENT recently and was told ACE inhibitor may be causing it.  She stopped the ACE/HCTZ, and the swelling went away.  She had been on it for many years.  She believes she has GERD.  She has been coughing a lot, feels draining sensation in back of her throat, draining downward.  She is most bothered by coughing, dry cough.    Has been on nexium for several months (ENT advised it) and the nexium does not help.  She has very rare pyrosis.  She has no dysphagia. Her weight has been stable.  She is on nexium twice a day.      Past Medical History  Diagnosis Date  . Fluid retention   . Hypertension   . High cholesterol   . Depression   . Allergic rhinitis   . GERD (gastroesophageal reflux disease)     Past Surgical History  Procedure Date  . Cholecystectomy 1991  . Abdominal hysterectomy     Many years ago  . Tonsillectomy   . Appendectomy   . Back surgery 07/21/2008  . Rotator cuff repair 2011    right  . Cataract extraction w/phaco 04/16/2011    Procedure: CATARACT EXTRACTION PHACO AND INTRAOCULAR LENS PLACEMENT (IOC);  Surgeon: Susa Simmonds;  Location: [facility];  Service: Ophthalmology;  Laterality: Left;  CDE=11.35    Current Outpatient Prescriptions  Medication Sig Dispense Refill  . alendronate (FOSAMAX) 70 MG tablet Take 70 mg by mouth every 7 (seven) days. On Mondays. Take with a full glass of water on an empty stomach.       Marland Kitchen amitriptyline (ELAVIL) 25 MG tablet  Take 25 mg by mouth at bedtime.        Marland Kitchen aspirin EC 81 MG tablet Take 81 mg by mouth daily.        . budesonide-formoterol (SYMBICORT) 160-4.5 MCG/ACT inhaler Inhale 1 puff into the lungs 2 (two) times daily.        . cetirizine (ZYRTEC) 10 MG tablet Take 10 mg by mouth daily.      . Chlorphen-Phenyltolox-PE-APAP 8-50-40-325 MG TB12 Take 1 tablet by mouth 2 (two) times daily as needed. For pain      . Cholecalciferol (VITAMIN D PO) Take 1 tablet by mouth daily.        Marland Kitchen esomeprazole (NEXIUM) 40 MG capsule Take 40 mg by mouth 2 (two) times daily.       . furosemide (LASIX) 20 MG tablet Take 10 mg by mouth daily.        Marland Kitchen losartan (COZAAR) 50 MG tablet Take 50 mg by mouth daily.      . ranitidine (ZANTAC) 150 MG tablet Take 150 mg by mouth 2 (two) times daily.        . simvastatin (ZOCOR) 10 MG tablet Take 10 mg by mouth at bedtime.        . vitamin E 400 UNIT capsule Take 400 Units by mouth daily.        Allergies as of 06/19/2011 - Review Complete  06/19/2011  Allergen Reaction Noted  . Codeine  10/08/2007  . Penicillins  10/08/2007    Family History  Problem Relation Age of Onset  . Colon cancer Neg Hx   . Anesthesia problems Neg Hx   . Hypotension Neg Hx   . Malignant hyperthermia Neg Hx   . Pseudochol deficiency Neg Hx   . Diabetes Other   . Allergies Other     History   Social History  . Marital Status: Divorced    Spouse Name: N/A    Number of Children: 4  . Years of Education: N/A   Occupational History  . 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 Smoking status: Current Some Day Smoker -- 0.1 packs/day for 40 years    Types: Cigarettes    Last Attempt to Quit: 08/28/2008  . Smokeless tobacco: Never Used   Comment: smokes 2 cigarettes a day  . Alcohol Use: No  . Drug Use: No  . Sexually Active: Not on file   Other Topics Concern  . Not on file   Social History Narrative   Divorced, lives  alone      Physical Exam: BP 142/80  Pulse 64  Ht 5\' 4"  (1.626 m)  Wt 146 lb 12.8 oz (66.588 kg)  BMI 25.20 kg/m2 Constitutional: generally well-appearing Psychiatric: alert and oriented x3 Abdomen: soft, nontender, nondistended, no obvious ascites, no peritoneal signs, normal bowel sounds     Assessment and plan: 77 y.o. female with  likely postnasal drip cough, swelling related to ACE inhibitor  She describes drainage down the back of her throat, watery eyes, congested feeling in her head and sinuses and chronic cough. She was told this is due to reflux but it does not sound like reflux to me. She is also on twice-daily proton pump inhibitor as well as once daily H2 blocker since her symptoms started and they have really not improved. I am going to arrange for her to meet with a pulmonary physician to discuss postnasal drip, chronic cough. She will decrease her Nexium to once daily for now. As far as her lip swelling is concerned. I recommended also that she stay off of the ACE inhibitor and we will get her back into see her cardiologist to consider alternative blood pressure medicines.

## 2011-06-19 NOTE — Patient Instructions (Addendum)
You have been given a separate informational sheet regarding your tobacco use, the importance of quitting and local resources to help you quit. [name] (ENT in [facility]) we will get records sent from her sent. You should stay off the lisinopril/HCTZ for now. We will get you in to see Dr. Antoine Poche to discuss a replacement for it.  You have an appointment 06/20/11 Wednesday to see the PA.  Please arrive at 915 am. We will arrange referral to pulmonologist for chronic cough (seems post nasal related to me).  You have an appointment on 06/20/18 Thursday 11:00 am arrival with Dr Marchelle Gearing.  [facility] 2nd Floor Would cut back nexium to once daily (in AM).

## 2011-06-20 ENCOUNTER — Encounter: Payer: Self-pay | Admitting: Physician Assistant

## 2011-06-20 ENCOUNTER — Ambulatory Visit (INDEPENDENT_AMBULATORY_CARE_PROVIDER_SITE_OTHER): Payer: Medicare Other | Admitting: Physician Assistant

## 2011-06-20 VITALS — BP 128/74 | HR 53 | Ht 64.0 in | Wt 147.0 lb

## 2011-06-20 DIAGNOSIS — E7800 Pure hypercholesterolemia, unspecified: Secondary | ICD-10-CM

## 2011-06-20 DIAGNOSIS — I10 Essential (primary) hypertension: Secondary | ICD-10-CM

## 2011-06-20 NOTE — Assessment & Plan Note (Signed)
Followed by her primary care.

## 2011-06-20 NOTE — Progress Notes (Signed)
HPI:  This is a 77 year old African American female patient who was referred to [place] for followup of hypertension. Her ACE/hydrochlorothiazide medication was stopped by her ENT because of lip swelling. She was then placed on losartan and Lasix by her primary care physicians.  Today she comes in her blood pressure is stable. Her cholesterol is managed by her primary care. Her main complaint is weakness and fatigue. She denies chest pain, palpitations, dyspnea, dyspnea on exertion, dizziness, or presyncope. She tries to watch her sodium intake.  Allergies  Allergen Reactions  . Codeine     REACTION: swelling, itching, eye irritation  . Penicillins     REACTION: swelling, itching, eye irritation    Current Outpatient Prescriptions on File Prior to Visit  Medication Sig Dispense Refill  . alendronate (FOSAMAX) 70 MG tablet Take 70 mg by mouth every 7 (seven) days. On Mondays. Take with a full glass of water on an empty stomach.       Marland Kitchen amitriptyline (ELAVIL) 25 MG tablet Take 25 mg by mouth at bedtime.        Marland Kitchen aspirin EC 81 MG tablet Take 81 mg by mouth daily.        . budesonide-formoterol (SYMBICORT) 160-4.5 MCG/ACT inhaler Inhale 1 puff into the lungs 2 (two) times daily.        . cetirizine (ZYRTEC) 10 MG tablet Take 10 mg by mouth daily.      . Chlorphen-Phenyltolox-PE-APAP 8-50-40-325 MG TB12 Take 1 tablet by mouth 2 (two) times daily as needed. For pain      . Cholecalciferol (VITAMIN D PO) Take 1 tablet by mouth daily.        Marland Kitchen esomeprazole (NEXIUM) 40 MG capsule Take 1 capsule (40 mg total) by mouth daily before breakfast.  30 capsule  11  . furosemide (LASIX) 20 MG tablet Take 10 mg by mouth daily.        Marland Kitchen losartan (COZAAR) 50 MG tablet Take 50 mg by mouth daily.      . ranitidine (ZANTAC) 150 MG tablet Take 150 mg by mouth 2 (two) times daily.        . simvastatin (ZOCOR) 10 MG tablet Take 10 mg by mouth at bedtime.        . vitamin E 400 UNIT capsule Take 400 Units by mouth daily.         Past Medical History  Diagnosis Date  . Fluid retention   . Hypertension   . High cholesterol   . Depression   . Allergic rhinitis   . GERD (gastroesophageal reflux disease)     Past Surgical History  Procedure Date  . Cholecystectomy 1991  . Abdominal hysterectomy     Many years ago  . Tonsillectomy   . Appendectomy   . Back surgery 07/21/2008  . Rotator cuff repair 2011    right  . Cataract extraction w/phaco 04/16/2011    Procedure: CATARACT EXTRACTION PHACO AND INTRAOCULAR LENS PLACEMENT (IOC);  Surgeon: Susa Simmonds;  Location: [facility];  Service: Ophthalmology;  Laterality: Left;  CDE=11.35    Family History  Problem Relation Age of Onset  . Colon cancer Neg Hx   . Anesthesia problems Neg Hx   . Hypotension Neg Hx   . Malignant hyperthermia Neg Hx   . Pseudochol deficiency Neg Hx   . Diabetes Other   . Allergies Other     History   Social History  . Marital Status: Divorced    Spouse Name:  N/A    Number of Children: 4  . Years of Education: N/A   Occupational History  . 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 Smoking status: Current Some Day Smoker -- 0.1 packs/day for 40 years    Types: Cigarettes    Last Attempt to Quit: 08/28/2008  . Smokeless tobacco: Never Used   Comment: smokes 2 cigarettes a day  . Alcohol Use: No  . Drug Use: No  . Sexually Active: Not on file   Other Topics Concern  . Not on file   Social History Narrative   Divorced, lives alone    ROS:see history of present illness   PHYSICAL EXAM: Well-nournished, in no acute distress. Neck: No JVD, HJR, Bruit, or thyroid enlargement Lungs: No tachypnea, clear without wheezing, rales, or rhonchi Cardiovascular: RRR, PMI not displaced, heart sounds normal, no murmurs, gallops, bruit, thrill, or heave. Abdomen: BS normal. Soft without organomegaly, masses, lesions or tenderness. Extremities: without  cyanosis, clubbing or edema. Good distal pulses bilateral SKin: Warm, no lesions or rashes  Musculoskeletal: No deformities Neuro: no focal signs  BP 128/74  Pulse 53  Ht 5\' 4"  (1.626 m)  Wt 147 lb (66.679 kg)  BMI 25.23 kg/m2   UJW:[id number] bradycardia 53 beats per minute with left bundle branch block, no acute change from tracing in 12/2010

## 2011-06-20 NOTE — Assessment & Plan Note (Signed)
Patient's blood pressure is controlled on losartan and Lasix. We will give her 2 g sodium diet.

## 2011-06-20 NOTE — Patient Instructions (Signed)
Your physician recommends that you schedule a follow-up appointment in: 1 year with Dr Antoine Poche    2 Gram Low Sodium Diet A 2 gram sodium diet restricts the amount of sodium in the diet to no more than 2 g or 2000 mg daily. Limiting the amount of sodium is often used to help lower blood pressure. It is important if you have heart, liver, or kidney problems. Many foods contain sodium for flavor and sometimes as a preservative. When the amount of sodium in a diet needs to be low, it is important to know what to look for when choosing foods and drinks. The following includes some information and guidelines to help make it easier for you to adapt to a low sodium diet. QUICK TIPS  Do not add salt to food.   Avoid convenience items and fast food.   Choose unsalted snack foods.   Buy lower sodium products, often labeled as "lower sodium" or "no salt added."   Check food labels to learn how much sodium is in 1 serving.   When eating at a restaurant, ask that your food be prepared with less salt or none, if possible.  READING FOOD LABELS FOR SODIUM INFORMATION The nutrition facts label is a good place to find how much sodium is in foods. Look for products with no more than 500 to 600 mg of sodium per meal and no more than 150 mg per serving. Remember that 2 g = 2000 mg. The food label may also list foods as:  Sodium-free: Less than 5 mg in a serving.   Very low sodium: 35 mg or less in a serving.   Low-sodium: 140 mg or less in a serving.   Light in sodium: 50% less sodium in a serving. For example, if a food that usually has 300 mg of sodium is changed to become light in sodium, it will have 150 mg of sodium.   Reduced sodium: 25% less sodium in a serving. For example, if a food that usually has 400 mg of sodium is changed to reduced sodium, it will have 300 mg of sodium.  CHOOSING FOODS Grains  Avoid: Salted crackers and snack items. Some cereals, including instant hot cereals. Bread  stuffing and biscuit mixes. Seasoned rice or pasta mixes.   Choose: Unsalted snack items. Low-sodium cereals, oats, puffed wheat and rice, shredded wheat. English muffins and bread. Pasta.  Meats  Avoid: Salted, canned, smoked, spiced, pickled meats, including fish and poultry. Bacon, ham, sausage, cold cuts, hot dogs, anchovies.   Choose: Low-sodium canned tuna and salmon. Fresh or frozen meat, poultry, and fish.  Dairy  Avoid: Processed cheese and spreads. Cottage cheese. Buttermilk and condensed milk. Regular cheese.   Choose: Milk. Low-sodium cottage cheese. Yogurt. Sour cream. Low-sodium cheese.  Fruits and Vegetables  Avoid: Regular canned vegetables. Regular canned tomato sauce and paste. Frozen vegetables in sauces. Olives. Rosita Fire. Relishes. Sauerkraut.   Choose: Low-sodium canned vegetables. Low-sodium tomato sauce and paste. Frozen or fresh vegetables. Fresh and frozen fruit.  Condiments  Avoid: Canned and packaged gravies. Worcestershire sauce. Tartar sauce. Barbecue sauce. Soy sauce. Steak sauce. Ketchup. Onion, garlic, and table salt. Meat flavorings and tenderizers.   Choose: Fresh and dried herbs and spices. Low-sodium varieties of mustard and ketchup. Lemon juice. Tabasco sauce. Horseradish.  SAMPLE 2 GRAM SODIUM MEAL PLAN Breakfast / Sodium (mg)  1 cup low-fat milk / 143 mg   2 slices whole-wheat toast / 270 mg   1 tbs heart-healthy margarine /  153 mg   1 hard-boiled egg / 139 mg   1 small orange / 0 mg  Lunch / Sodium (mg)  1 cup raw carrots / 76 mg    cup hummus / 298 mg   1 cup low-fat milk / 143 mg    cup red grapes / 2 mg   1 whole-wheat pita bread / 356 mg  Dinner / Sodium (mg)  1 cup whole-wheat pasta / 2 mg   1 cup low-sodium tomato sauce / 73 mg   3 oz lean ground beef / 57 mg   1 small side salad (1 cup raw spinach leaves,  cup cucumber,  cup yellow bell pepper) with 1 tsp olive oil and 1 tsp red wine vinegar / 25 mg  Snack /  Sodium (mg)  1 container low-fat vanilla yogurt / 107 mg   3 graham cracker squares / 127 mg  Nutrient Analysis  Calories: 2033   Protein: 77 g   Carbohydrate: 282 g   Fat: 72 g   Sodium: 1971 mg  Document Released: 04/16/2005 Document Revised: 12/27/2010 Document Reviewed: 07/18/2009 [facility] Patient Information 2012 [place], [place].

## 2011-06-21 ENCOUNTER — Encounter: Payer: Self-pay | Admitting: Internal Medicine

## 2011-06-21 ENCOUNTER — Ambulatory Visit (INDEPENDENT_AMBULATORY_CARE_PROVIDER_SITE_OTHER): Payer: Medicare Other | Admitting: Internal Medicine

## 2011-06-21 VITALS — BP 134/80 | HR 74 | Temp 98.3°F | Ht 64.0 in | Wt 156.0 lb

## 2011-06-21 DIAGNOSIS — R05 Cough: Secondary | ICD-10-CM

## 2011-06-21 NOTE — Progress Notes (Signed)
Subjective:    Patient ID: [name], female    DOB: 09-Sep-1934, 77 y.o.   MRN: [id number]  HPI IOV 06/21/2011  PMD is Dr Birdena Jubilee in [place], [place]  77 year old female referred for chronic cough. Ex-smoker. STarted smoking 20 years ago. Quit 05/01/11. SMoked 3-4 cigs per day.   Reports chronic cough x 2 years. Current RSI cough score 28 - level 5 clearing of throat, post nasal drop, cough after lying down and annoying cough. LEvel 4 - hoarseness of voice and heart burn.  Lot of Rx tried per hx. . Has seen pulmonary  V ENT at [facility] v [facility] (Dr Macon Large).  Very poor historian. Says Nexium for GERD in Oct 2012 helped some and "real good" for 30 days but then did not work but still continuing with it. . Denies asthma diagnosis. Has lot of post nasal drainage - is biggest symptom for her. Prior ACE inhibitor has been discontinued. She says diagnosis for cough was GERD but patient thinks is is sinus drainage which apparently was not given as etiology. Of note, she is on chronic elavil/amitriptyline which she says she takes a night for "blood pressure"  (I reminded her is for neuropathic pain v depression  And she was unsure). Noted to be on symbicort on med list but she is off it for 2 months because MD said she did not need it. Currently taking only albuterol prn which she is needing it twice a week. Med list has zyrtec but she says she is not taking it because MD said she did not need it. Overall states very confused with medications in terms of schedule and indications   Current medications include    Current outpatient prescriptions:alendronate (FOSAMAX) 70 MG tablet, Take 70 mg by mouth every 7 (seven) days. On Mondays. Take with a full glass of water on an empty stomach. , Disp: , Rfl: ;  amitriptyline (ELAVIL) 25 MG tablet, Take 25 mg by mouth at bedtime.  , Disp: , Rfl: ;  aspirin EC 81 MG tablet, Take 81 mg by mouth daily.  , Disp: , Rfl:  budesonide-formoterol (SYMBICORT)  160-4.5 MCG/ACT inhaler, Inhale 1 puff into the lungs 2 (two) times daily.  , Disp: , Rfl: ;  cetirizine (ZYRTEC) 10 MG tablet, Take 10 mg by mouth daily., Disp: , Rfl: ;  esomeprazole (NEXIUM) 40 MG capsule, Take 1 capsule (40 mg total) by mouth daily before breakfast., Disp: 30 capsule, Rfl: 11;  furosemide (LASIX) 20 MG tablet, Take 10 mg by mouth daily.  , Disp: , Rfl:  losartan (COZAAR) 50 MG tablet, Take 50 mg by mouth daily., Disp: , Rfl: ;  PROAIR HFA 108 (90 BASE) MCG/ACT inhaler, 2 puffs every 6 hours as needed, Disp: , Rfl: ;  ranitidine (ZANTAC) 150 MG tablet, Take 150 mg by mouth 2 (two) times daily.  , Disp: , Rfl: ;  simvastatin (ZOCOR) 10 MG tablet, Take 10 mg by mouth at bedtime.  , Disp: , Rfl: ;  traMADol (ULTRAM) 50 MG tablet, 1 by mouth twice daily as needed for pain, Disp: , Rfl:  vitamin E 400 UNIT capsule, Take 400 Units by mouth daily., Disp: , Rfl:      CT chest 01/01/11 No evidence for pulmonary embolism.  Chronic lung changes with volume loss and possible scarring in the  right lower lobe.  6 mm nodule in the right upper lobe is indeterminate.If the patient  is at high risk for  bronchogenic carcinoma, follow-up chest CT at 6-  12 months is recommended. If the patient is at low risk for  bronchogenic carcinoma, follow-up chest CT at 12 months is  recommended.  Past Medical History  Diagnosis Date  . Fluid retention   . Hypertension   . High cholesterol   . Depression   . Allergic rhinitis   . GERD (gastroesophageal reflux disease)      Family History  Problem Relation Age of Onset  . Colon cancer Neg Hx   . Anesthesia problems Neg Hx   . Hypotension Neg Hx   . Malignant hyperthermia Neg Hx   . Pseudochol deficiency Neg Hx   . Diabetes Other   . Allergies Other      History   Social History  . Marital Status: Divorced    Spouse Name: N/A    Number of Children: 4  . Years of Education: N/A   Occupational History  . 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 Smoking status: Former Smoker -- 0.3 packs/day for 40 years    Types: Cigarettes    Quit date: 05/01/2011  . Smokeless tobacco: Never Used   Comment: smokes 2 cigarettes a day  . Alcohol Use: No  . Drug Use: No  . Sexually Active: Not on file   Other Topics Concern  . Not on file   Social History Narrative   Divorced, lives alone     Allergies  Allergen Reactions  . Codeine     REACTION: swelling, itching, eye irritation  . Lisinopril-Hydrochlorothiazide     Lip swelling. Stopped by ENT  . Penicillins     REACTION: swelling, itching, eye irritation     Outpatient Prescriptions Prior to Visit  Medication Sig Dispense Refill  . alendronate (FOSAMAX) 70 MG tablet Take 70 mg by mouth every 7 (seven) days. On Mondays. Take with a full glass of water on an empty stomach.       Marland Kitchen amitriptyline (ELAVIL) 25 MG tablet Take 25 mg by mouth at bedtime.        Marland Kitchen aspirin EC 81 MG tablet Take 81 mg by mouth daily.        . budesonide-formoterol (SYMBICORT) 160-4.5 MCG/ACT inhaler Inhale 1 puff into the lungs 2 (two) times daily.        . cetirizine (ZYRTEC) 10 MG tablet Take 10 mg by mouth daily.      Marland Kitchen esomeprazole (NEXIUM) 40 MG capsule Take 1 capsule (40 mg total) by mouth daily before breakfast.  30 capsule  11  . furosemide (LASIX) 20 MG tablet Take 10 mg by mouth daily.        Marland Kitchen losartan (COZAAR) 50 MG tablet Take 50 mg by mouth daily.      . ranitidine (ZANTAC) 150 MG tablet Take 150 mg by mouth 2 (two) times daily.        . simvastatin (ZOCOR) 10 MG tablet Take 10 mg by mouth at bedtime.        . vitamin E 400 UNIT capsule Take 400 Units by mouth daily.      . Chlorphen-Phenyltolox-PE-APAP 8-50-40-325 MG TB12 Take 1 tablet by mouth 2 (two) times daily as needed. For pain      . Cholecalciferol (VITAMIN D PO) Take 1 tablet by mouth daily.            HPI   Review  of Systems  Constitutional:  Negative.  Negative for fever and unexpected weight change.  HENT: Positive for congestion, trouble swallowing and postnasal drip. Negative for ear pain, nosebleeds, sore throat, rhinorrhea, sneezing, dental problem and sinus pressure.   Eyes: Positive for redness and itching.  Respiratory: Positive for cough. Negative for chest tightness, shortness of breath and wheezing.   Cardiovascular: Negative.  Negative for palpitations and leg swelling.  Gastrointestinal: Negative.  Negative for nausea and vomiting.  Genitourinary: Negative.  Negative for dysuria.  Musculoskeletal: Negative.  Negative for joint swelling.  Skin: Negative.  Negative for rash.  Neurological: Negative.  Negative for headaches.  Hematological: Negative.  Does not bruise/bleed easily.  Psychiatric/Behavioral: Negative.  Negative for dysphoric mood. The patient is not nervous/anxious.        Objective:   Physical Exam  Vitals reviewed. Constitutional: She is oriented to person, place, and time. She appears well-developed and well-nourished. No distress.  HENT:  Head: Normocephalic and atraumatic.  Right Ear: External ear normal.  Left Ear: External ear normal.  Mouth/Throat: Oropharynx is clear and moist. No oropharyngeal exudate.  Eyes: Conjunctivae and EOM are normal. Pupils are equal, round, and reactive to light. Right eye exhibits no discharge. Left eye exhibits no discharge. No scleral icterus.  Neck: Normal range of motion. Neck supple. No JVD present. No tracheal deviation present. No thyromegaly present.  Cardiovascular: Normal rate, regular rhythm, normal heart sounds and intact distal pulses.  Exam reveals no gallop and no friction rub.   No murmur heard. Pulmonary/Chest: Effort normal and breath sounds normal. No respiratory distress. She has no wheezes. She has no rales. She exhibits no tenderness.       Post nasal drip +  Abdominal: Soft. Bowel sounds are normal. She exhibits no distension and no mass.  There is no tenderness. There is no rebound and no guarding.  Musculoskeletal: Normal range of motion. She exhibits no edema and no tenderness.  Lymphadenopathy:    She has no cervical adenopathy.  Neurological: She is alert and oriented to person, place, and time. She has normal reflexes. No cranial nerve deficit. She exhibits normal muscle tone. Coordination normal.  Skin: Skin is warm and dry. No rash noted. She is not diaphoretic. No erythema. No pallor.  Psychiatric: She has a normal mood and affect. Her behavior is normal. Judgment and thought content normal.          Assessment & Plan:

## 2011-06-21 NOTE — Patient Instructions (Signed)
Cause for cough is not fully clear to me Please sign release to get your records from [facility] Please have methacholine challenge test - for this test you cannot be on symbicort for 2-3 weeks, so do not start it  Please have sinus ct without contrast for chronic cough REturn to see me after above < 1-2 weeks

## 2011-06-28 ENCOUNTER — Ambulatory Visit (HOSPITAL_COMMUNITY)
Admission: RE | Admit: 2011-06-28 | Discharge: 2011-06-28 | Disposition: A | Discharge status: Home / Self Care | Payer: Medicare Other | Source: Ambulatory Visit | Attending: Internal Medicine | Admitting: Internal Medicine

## 2011-06-28 DIAGNOSIS — R05 Cough: Secondary | ICD-10-CM | POA: Insufficient documentation

## 2011-06-28 DIAGNOSIS — R059 Cough, unspecified: Secondary | ICD-10-CM | POA: Insufficient documentation

## 2011-07-02 ENCOUNTER — Telehealth: Payer: Self-pay | Admitting: Internal Medicine

## 2011-07-02 NOTE — Telephone Encounter (Signed)
I informed pt of MR's findings and recommendations. Pt verbalized understanding. See results to CT sinus.

## 2011-07-02 NOTE — Assessment & Plan Note (Signed)
Cause for cough is not fully clear to me Please sign release to get your records from [facility] Please have methacholine challenge test - for this test you cannot be on symbicort for 2-3 weeks, so do not start it  Please have sinus ct without contrast for chronic cough REturn to see me after above < 1-2 weeks 

## 2011-07-09 ENCOUNTER — Telehealth: Payer: Self-pay | Admitting: Internal Medicine

## 2011-07-09 NOTE — Telephone Encounter (Signed)
I provided the pt with the # to cone to r/s at [phone]. Pt will call and r/s appt. I verified with the pt that she has not started the symbicort and she states she has not. Carron Curie, CMA

## 2011-07-16 ENCOUNTER — Encounter (HOSPITAL_COMMUNITY): Payer: Medicare Other

## 2011-07-19 ENCOUNTER — Ambulatory Visit (HOSPITAL_COMMUNITY)
Admission: RE | Admit: 2011-07-19 | Discharge: 2011-07-19 | Disposition: A | Discharge status: Home / Self Care | Payer: Medicare Other | Source: Ambulatory Visit | Attending: Internal Medicine | Admitting: Internal Medicine

## 2011-07-19 DIAGNOSIS — R059 Cough, unspecified: Secondary | ICD-10-CM | POA: Insufficient documentation

## 2011-07-19 DIAGNOSIS — R05 Cough: Secondary | ICD-10-CM | POA: Insufficient documentation

## 2011-07-19 LAB — PULMONARY FUNCTION TEST

## 2011-07-19 MED ORDER — METHACHOLINE 4 MG/ML NEB SOLN: 2.0000 mL | Freq: Once | RESPIRATORY_TRACT | Status: DC

## 2011-07-19 MED ORDER — METHACHOLINE 0.0625 MG/ML NEB SOLN
2.0000 mL | Freq: Once | RESPIRATORY_TRACT | Status: AC
  Administered 2011-07-19: 0.125 mg via RESPIRATORY_TRACT

## 2011-07-19 MED ORDER — METHACHOLINE 0.25 MG/ML NEB SOLN
2.0000 mL | Freq: Once | RESPIRATORY_TRACT | Status: AC
  Administered 2011-07-19: 0.5 mg via RESPIRATORY_TRACT

## 2011-07-19 MED ORDER — ALBUTEROL SULFATE (5 MG/ML) 0.5% IN NEBU
2.5000 mg | INHALATION_SOLUTION | Freq: Once | RESPIRATORY_TRACT | Status: AC
  Administered 2011-07-19: 2.5 mg via RESPIRATORY_TRACT

## 2011-07-19 MED ORDER — METHACHOLINE 16 MG/ML NEB SOLN: 2.0000 mL | Freq: Once | RESPIRATORY_TRACT | Status: DC

## 2011-07-19 MED ORDER — SODIUM CHLORIDE 0.9 % IN NEBU
3.0000 mL | INHALATION_SOLUTION | Freq: Once | RESPIRATORY_TRACT | Status: AC
  Administered 2011-07-19: 3 mL via RESPIRATORY_TRACT
  Filled 2011-07-19: qty 3

## 2011-07-19 MED ORDER — METHACHOLINE 1 MG/ML NEB SOLN: 2.0000 mL | Freq: Once | RESPIRATORY_TRACT | Status: DC

## 2011-07-20 ENCOUNTER — Telehealth: Payer: Self-pay | Admitting: Internal Medicine

## 2011-07-20 NOTE — Telephone Encounter (Signed)
Please have methacholine challeng test results in my look at if not already done  [facility] for next week

## 2011-07-20 NOTE — Telephone Encounter (Signed)
Spoke with patient-aware that MR is not in the office until 08-20-2011 and is booked out until 08-29-2011-will need to send to MR and Victorino Dike to approve to double book sooner appt for patient. Pt requests an am appt just not on Tuesdays or Fridays as she works then. Also, pt states she will be going to work on Monday around 230pm but is okay to leave a detailed message on her answering machine. Please advise. Thanks.

## 2011-07-20 NOTE — Telephone Encounter (Signed)
Results are in MR's look at. LMTCB for pt

## 2011-07-24 NOTE — Telephone Encounter (Signed)
Spoke with the patient and Mr had an opening for Thurs., 3/28 @ 2:45pm. Pt will be seen at that time to discuss test results.

## 2011-07-24 NOTE — Telephone Encounter (Signed)
LMOM TCB x2.  MR is in the office on 3-26, 3-27 and 3-28 in the PM and is double-booked at 1:30 on each of these days.  Need a work-in slot if pt is to be seen this week.  Jenn, please advise if and when pt may be worked in.  Thanks.

## 2011-07-25 ENCOUNTER — Ambulatory Visit: Payer: Medicare Other | Admitting: Internal Medicine

## 2011-07-26 ENCOUNTER — Ambulatory Visit (INDEPENDENT_AMBULATORY_CARE_PROVIDER_SITE_OTHER): Payer: Medicare Other | Admitting: Internal Medicine

## 2011-07-26 ENCOUNTER — Encounter: Payer: Self-pay | Admitting: Internal Medicine

## 2011-07-26 VITALS — BP 120/74 | HR 75 | Temp 98.1°F | Ht 62.0 in | Wt 148.8 lb

## 2011-07-26 DIAGNOSIS — R05 Cough: Secondary | ICD-10-CM

## 2011-07-26 MED ORDER — FLUTICASONE PROPIONATE 50 MCG/ACT NA SUSP: 2.0000 | Freq: Every day | NASAL | Status: DC

## 2011-07-26 NOTE — Patient Instructions (Addendum)
Cough is from sinus drainage, acid reflux, asthma, and vocal cord dysfunction All of this is working together to cause cyclical cough/LPR cough  #Sinus drainage  - CT scan sinus is normal but you do have sinus drainage - start  take generic fluticasone inhaler 2 squirts each nostril daily  - continue zyrtec once daily as before  #Acid Reflux  - you do have acid reflux causing cough based on you telling me that when you take it cough improves and when you stop it cough gets worse; this has happened two times already  - continue nexium once daily but take on empty stomach - stop fosfamax - this medicine is good for bone health but can make acid reflux worse. We will tell you when you can restart it   -  avoid colas, spices, cheeses, spirits, red meats, beer, chocolates, fried foods etc.,   - sleep with head end of bed elevated  - eat small frequent meals  - do not go to bed for 3 hours after last meal  - at future visit will give you diet sheet  #Asthma   - continue Please start symbicort 160/4.5, 2 puff twice daily - take sample, script and show technique   #Followup  - Please know that without you understanding your medications and taking it regularly your cough will never get better  - To get to know your medications better please visit asap with my nurse Tammy for med calendar - I will see you in 4-6 weeks.  - any problems call or come sooner  - cough score at followup

## 2011-07-26 NOTE — Progress Notes (Signed)
Subjective:    Patient ID: [name], female    DOB: Jul 07, 1934, 77 y.o.   MRN: [id number]  HPI IOV 06/21/2011  PMD is Dr Birdena Jubilee in [place], [place]  77 year old female referred for chronic cough. Ex-smoker. STarted smoking 20 years ago. Quit 05/01/11. SMoked 3-4 cigs per day.   Reports chronic cough x 2 years. Current RSI cough score 28 - level 5 clearing of throat, post nasal drop, cough after lying down and annoying cough. LEvel 4 - hoarseness of voice and heart burn.  Lot of Rx tried per hx. . Has seen pulmonary  V ENT at [facility] v both (Dr Macon Large).  Very poor historian. Says Nexium for GERD in Oct 2012 helped some and "real good" for 30 days but then did not work but still continuing with it. . Denies asthma diagnosis. Has lot of post nasal drainage - is biggest symptom for her. Prior ACE inhibitor has been discontinued. She says diagnosis for cough was GERD but patient thinks is is sinus drainage which apparently was not given as etiology. Of note, she is on chronic elavil/amitriptyline which she says she takes a night for "blood pressure"  (I reminded her is for neuropathic pain v depression  And she was unsure). Noted to be on symbicort on med list but she is off it for 2 months because MD said she did not need it. Currently taking only albuterol prn which she is needing it twice a week. Med list has zyrtec but she says she is not taking it because MD said she did not need it. Overall states very confused with medications in terms of schedule and indications   REC Cause for cough is not fully clear to me  Please sign release to get your records from [facility]  Please have methacholine challenge test - for this test you cannot be on symbicort for 2-3 weeks, so do not start it  Please have sinus ct without contrast for chronic cough  REturn to see me after above < 1-2 weeks  Ov 07/26/2011 [place] chronic cough. Cough reportedly better but subjective score shows cough is  unchanged. RSI cough score is still 28 with (level 5 clearing of throat, choking episodes, and heartburn Level 4 - troublesome cough, post nasal drip, Level 3 hoarseness voice, cough after lying down). Worksup so fare - CT sinsus 06/28/11 is negagtive though she still has lot of sinus drainage. Methacholine challenge test 07/19/11 is positive at 0.25 level and is suggestive of asthma.      Current outpatient prescriptions:alendronate (FOSAMAX) 70 MG tablet, Take 70 mg by mouth every 7 (seven) days. On Mondays. Take with a full glass of water on an empty stomach. , Disp: , Rfl: ;  amitriptyline (ELAVIL) 25 MG tablet, Take 25 mg by mouth at bedtime.  , Disp: , Rfl: ;  aspirin EC 81 MG tablet, Take 81 mg by mouth daily.  , Disp: , Rfl:  budesonide-formoterol (SYMBICORT) 160-4.5 MCG/ACT inhaler, Inhale 1 puff into the lungs 2 (two) times daily.  , Disp: , Rfl: ;  cetirizine (ZYRTEC) 10 MG tablet, Take 10 mg by mouth daily., Disp: , Rfl: ;  esomeprazole (NEXIUM) 40 MG capsule, Take 1 capsule (40 mg total) by mouth daily before breakfast., Disp: 30 capsule, Rfl: 11;  furosemide (LASIX) 20 MG tablet, Take 10 mg by mouth daily.  , Disp: , Rfl:  losartan (COZAAR) 50 MG tablet, Take 50 mg by mouth daily., Disp: , Rfl: ;  PROAIR HFA 108 (90 BASE) MCG/ACT inhaler, 2 puffs every 6 hours as needed, Disp: , Rfl: ;  ranitidine (ZANTAC) 150 MG tablet, Take 150 mg by mouth daily. , Disp: , Rfl: ;  simvastatin (ZOCOR) 10 MG tablet, Take 10 mg by mouth at bedtime.  , Disp: , Rfl: ;  traMADol (ULTRAM) 50 MG tablet, 1 by mouth twice daily as needed for pain, Disp: , Rfl:  vitamin E 400 UNIT capsule, Take 400 Units by mouth daily., Disp: , Rfl:   Past, Family, Social reviewed: no change since last visit     Review of Systems  Constitutional: Negative for fever and unexpected weight change.  HENT: Negative for ear pain, nosebleeds, congestion, sore throat, rhinorrhea, sneezing, trouble swallowing, dental problem, postnasal  drip and sinus pressure.   Eyes: Negative for redness and itching.  Respiratory: Negative for cough, chest tightness, shortness of breath and wheezing.   Cardiovascular: Negative for palpitations and leg swelling.  Gastrointestinal: Negative for nausea and vomiting.  Genitourinary: Negative for dysuria.  Musculoskeletal: Negative for joint swelling.  Skin: Negative for rash.  Neurological: Negative for headaches.  Hematological: Does not bruise/bleed easily.  Psychiatric/Behavioral: Negative for dysphoric mood. The patient is not nervous/anxious.        Objective:   Physical Exam Vitals reviewed. Constitutional: She is oriented to person, place, and time. She appears well-developed and well-nourished. No distress.  HENT:  Head: Normocephalic and atraumatic.  Right Ear: External ear normal.  Left Ear: External ear normal.  Mouth/Throat: Oropharynx is clear and moist. No oropharyngeal exudate.  Eyes: Conjunctivae and EOM are normal. Pupils are equal, round, and reactive to light. Right eye exhibits no discharge. Left eye exhibits no discharge. No scleral icterus.  Neck: Normal range of motion. Neck supple. No JVD present. No tracheal deviation present. No thyromegaly present.  Cardiovascular: Normal rate, regular rhythm, normal heart sounds and intact distal pulses.  Exam reveals no gallop and no friction rub.   No murmur heard. Pulmonary/Chest: Effort normal and breath sounds normal. No respiratory distress. She has no wheezes. She has no rales. She exhibits no tenderness.       Post nasal drip +  Abdominal: Soft. Bowel sounds are normal. She exhibits no distension and no mass. There is no tenderness. There is no rebound and no guarding.  Musculoskeletal: Normal range of motion. She exhibits no edema and no tenderness.  Lymphadenopathy:    She has no cervical adenopathy.  Neurological: She is alert and oriented to person, place, and time. She has normal reflexes. No cranial nerve  deficit. She exhibits normal muscle tone. Coordination normal.  Skin: Skin is warm and dry. No rash noted. She is not diaphoretic. No erythema. No pallor.  Psychiatric: She has a normal mood and affect. Her behavior is normal. Judgment and thought content normal.             Assessment & Plan:

## 2011-07-30 ENCOUNTER — Encounter: Payer: Self-pay | Admitting: Internal Medicine

## 2011-07-30 NOTE — Assessment & Plan Note (Addendum)
Cough is from sinus drainage, acid reflux, asthma,  All of this is working together to cause cyclical cough/LPR cough Cough essentially no better than last visit.  Also has poor understanding of medications  #Sinus drainage  - CT scan sinus is normal but you do have sinus drainage - start  take generic fluticasone inhaler 2 squirts each nostril daily  - continue zyrtec once daily as before  #Acid Reflux  - you do have acid reflux causing cough based on you telling me that when you take it cough improves and when you stop it cough gets worse; this has happened two times already  - continue nexium once daily but take on empty stomach - stop fosfamax - this medicine is good for bone health but can make acid reflux worse. We will tell you when you can restart it   -  avoid colas, spices, cheeses, spirits, red meats, beer, chocolates, fried foods etc.,   - sleep with head end of bed elevated  - eat small frequent meals  - do not go to bed for 3 hours after last meal  - at future visit will give you diet sheet  #Asthma   - continue Please restart symbicort 160/4.5, 2 puff twice daily - take sample, script and show technique   #Followup  - Please know that without you understanding your medications and taking it regularly your cough will never get better  - To get to know your medications better please visit asap with my nurse Tammy for med calendar - I will see you in 4-6 weeks.  - any problems call or come sooner  - cough score at followup   > 50% of this > 15 min visit spent in face to face counseling

## 2011-08-01 ENCOUNTER — Encounter: Payer: Medicare Other | Admitting: Adult Health

## 2011-08-02 ENCOUNTER — Ambulatory Visit (INDEPENDENT_AMBULATORY_CARE_PROVIDER_SITE_OTHER): Payer: Medicare Other | Admitting: Adult Health

## 2011-08-02 ENCOUNTER — Encounter: Payer: Self-pay | Admitting: Adult Health

## 2011-08-02 VITALS — BP 130/78 | HR 62 | Temp 97.2°F | Ht 62.0 in | Wt 147.2 lb

## 2011-08-02 DIAGNOSIS — R05 Cough: Secondary | ICD-10-CM

## 2011-08-02 NOTE — Patient Instructions (Signed)
May use Delsym 2 tsp every 12 hrs as needed for cough  follow up Dr. Marchelle Gearing in 6 weeks as planned and As needed   Follow med calendar closely and bring to each visit.

## 2011-08-02 NOTE — Assessment & Plan Note (Signed)
Continue on preventive regimen  May add delsym As needed  Cough  Patient's medications were reviewed today and patient education was given. Computerized medication calendar was adjusted/completed

## 2011-08-02 NOTE — Progress Notes (Signed)
Subjective:    Patient ID: [name], female    DOB: [birth year], 77 y.o.   MRN: [id number]  HPI IOV 06/21/2011  PMD is Dr Birdena Jubilee in [place], [place]  77 year old female referred for chronic cough. Ex-smoker. STarted smoking 20 years ago. Quit 05/01/11. SMoked 3-4 cigs per day.   Reports chronic cough x 2 years. Current RSI cough score 28 - level 5 clearing of throat, post nasal drop, cough after lying down and annoying cough. LEvel 4 - hoarseness of voice and heart burn.  Lot of Rx tried per hx. . Has seen pulmonary  V ENT at [facility] v both (Dr Macon Large).  Very poor historian. Says Nexium for GERD in Oct 2012 helped some and "real good" for 30 days but then did not work but still continuing with it. . Denies asthma diagnosis. Has lot of post nasal drainage - is biggest symptom for her. Prior ACE inhibitor has been discontinued. She says diagnosis for cough was GERD but patient thinks is is sinus drainage which apparently was not given as etiology. Of note, she is on chronic elavil/amitriptyline which she says she takes a night for "blood pressure"  (I reminded her is for neuropathic pain v depression  And she was unsure). Noted to be on symbicort on med list but she is off it for 2 months because MD said she did not need it. Currently taking only albuterol prn which she is needing it twice a week. Med list has zyrtec but she says she is not taking it because MD said she did not need it. Overall states very confused with medications in terms of schedule and indications   REC Cause for cough is not fully clear to me  Please sign release to get your records from [facility]  Please have methacholine challenge test - for this test you cannot be on symbicort for 2-3 weeks, so do not start it  Please have sinus ct without contrast for chronic cough  REturn to see me after above < 1-2 weeks  Ov 07/26/2011 [place] chronic cough. Cough reportedly better but subjective score shows cough is  unchanged. RSI cough score is still 28 with (level 5 clearing of throat, choking episodes, and heartburn Level 4 - troublesome cough, post nasal drip, Level 3 hoarseness voice, cough after lying down). Worksup so fare - CT sinsus 06/28/11 is negagtive though she still has lot of sinus drainage. Methacholine challenge test 07/19/11 is positive at 0.25 level and is suggestive of asthma.  >>symbicort rx , PPI   08/02/2011 Follow up and med review  Patient returns for a one-week followup and medication review. We reviewed all her medications in detail and organized them into a medication calendar with patient education. Last visit. Patient was recommended to continue on Symbicort twice daily. Continue on her reflux preventive regimens, all, and it was started on Flonase  Nasal spray. Her Fosamax was placed on hold due to potential for upper airway irritation. Patient says that she is slightly better, but continues to have a dry cough.          Review of Systems  Constitutional:   No  weight loss, night sweats,  Fevers, chills, + fatigue, or  lassitude.  HEENT:   No headaches,  Difficulty swallowing,  Tooth/dental problems, or  Sore throat,                No sneezing, itching, ear ache, + nasal congestion, post nasal drip,  CV:  No chest pain,  Orthopnea, PND, swelling in lower extremities, anasarca, dizziness, palpitations, syncope.   GI  No heartburn, indigestion, abdominal pain, nausea, vomiting, diarrhea, change in bowel habits, loss of appetite, bloody stools.   Resp: No shortness of breath with exertion or at rest.  No excess mucus, no productive cough,     No coughing up of blood.  No change in color of mucus.  No wheezing.  No chest wall deformity  Skin: no rash or lesions.  GU: no dysuria, change in color of urine, no urgency or frequency.  No flank pain, no hematuria   MS:  No joint pain or swelling.  No decreased range of motion.  No back pain.  Psych:  No change in mood or affect.  No depression or anxiety.  No memory loss.            Objective:   GEN: A/Ox3; pleasant , NAD, elderly   HEENT:  [place]/AT,  EACs-clear, TMs-wnl, NOSE-clear, THROAT-clear drainage , no lesions, no postnasal drip or exudate noted.   NECK:  Supple w/ fair ROM; no JVD; normal carotid impulses w/o bruits; no thyromegaly or nodules palpated; no lymphadenopathy.  RESP  Clear  P & A; w/o, wheezes/ rales/ or rhonchi.no accessory muscle use, no dullness to percussion  CARD:  RRR, no m/r/g  , no peripheral edema, pulses intact, no cyanosis or clubbing.  GI:   Soft & nt; nml bowel sounds; no organomegaly or masses detected.  Musco: Warm bil, no deformities or joint swelling noted.   Neuro: alert, no focal deficits noted.    Skin: Warm, no lesions or rashes           Assessment & Plan:

## 2011-08-06 NOTE — Progress Notes (Signed)
Addended by: Nita Sells on: 08/06/2011 03:14 PM   Modules accepted: Orders

## 2011-08-23 ENCOUNTER — Encounter: Payer: Medicare Other | Admitting: Adult Health

## 2011-09-13 ENCOUNTER — Ambulatory Visit: Payer: Medicare Other | Admitting: Internal Medicine

## 2011-09-28 ENCOUNTER — Ambulatory Visit: Payer: Medicare Other | Admitting: Internal Medicine

## 2011-10-17 ENCOUNTER — Ambulatory Visit: Payer: Medicare Other | Admitting: Internal Medicine

## 2011-10-30 ENCOUNTER — Other Ambulatory Visit: Payer: Self-pay | Admitting: Family Medicine

## 2011-10-30 DIAGNOSIS — J984 Other disorders of lung: Secondary | ICD-10-CM

## 2011-11-05 ENCOUNTER — Ambulatory Visit (HOSPITAL_COMMUNITY): Payer: Medicare Other

## 2011-11-06 ENCOUNTER — Encounter: Payer: Self-pay | Admitting: Internal Medicine

## 2011-11-06 ENCOUNTER — Ambulatory Visit (INDEPENDENT_AMBULATORY_CARE_PROVIDER_SITE_OTHER): Payer: Medicare Other | Admitting: Internal Medicine

## 2011-11-06 VITALS — BP 102/62 | HR 83 | Temp 98.1°F | Ht 64.0 in | Wt 140.8 lb

## 2011-11-06 DIAGNOSIS — R05 Cough: Secondary | ICD-10-CM

## 2011-11-06 NOTE — Progress Notes (Signed)
Subjective:    Patient ID: [name], female    DOB: 1934-07-03, 77 y.o.   MRN: [id number]  HPI IOV 06/21/2011  PMD is Dr Birdena Jubilee in [place], [place]  77 year old female referred for chronic cough. Ex-smoker. STarted smoking 20 years ago. Quit 05/01/11. SMoked 3-4 cigs per day.   Reports chronic cough x 2 years. Current RSI cough score 28 - level 5 clearing of throat, post nasal drop, cough after lying down and annoying cough. LEvel 4 - hoarseness of voice and heart burn.  Lot of Rx tried per hx. . Has seen pulmonary  V ENT at [facility] v both (Dr Macon Large).  Very poor historian. Says Nexium for GERD in Oct 2012 helped some and "real good" for 30 days but then did not work but still continuing with it. . Denies asthma diagnosis. Has lot of post nasal drainage - is biggest symptom for her. Prior ACE inhibitor has been discontinued. She says diagnosis for cough was GERD but patient thinks is is sinus drainage which apparently was not given as etiology. Of note, she is on chronic elavil/amitriptyline which she says she takes a night for "blood pressure"  (I reminded her is for neuropathic pain v depression  And she was unsure). Noted to be on symbicort on med list but she is off it for 2 months because MD said she did not need it. Currently taking only albuterol prn which she is needing it twice a week. Med list has zyrtec but she says she is not taking it because MD said she did not need it. Overall states very confused with medications in terms of schedule and indications   REC Cause for cough is not fully clear to me  Please sign release to get your records from [facility]  Please have methacholine challenge test - for this test you cannot be on symbicort for 2-3 weeks, so do not start it  Please have sinus ct without contrast for chronic cough  REturn to see me after above < 1-2 weeks  Ov 07/26/2011 [place] chronic cough. Cough reportedly better but subjective score shows cough is  unchanged. RSI cough score is still 28 with (level 5 clearing of throat, choking episodes, and heartburn Level 4 - troublesome cough, post nasal drip, Level 3 hoarseness voice, cough after lying down). Worksup so fare - CT sinsus 06/28/11 is negagtive though she still has lot of sinus drainage. Methacholine challenge test 07/19/11 is positive at 0.25 level and is suggestive of asthma.   REC  Cough is from sinus drainage, acid reflux, asthma, and vocal cord dysfunction  All of this is working together to cause cyclical cough/LPR cough  #Sinus drainage  - CT scan sinus is normal but you do have sinus drainage  - start take generic fluticasone inhaler 2 squirts each nostril daily  - continue zyrtec once daily as before  #Acid Reflux  - you do have acid reflux causing cough based on you telling me that when you take it cough improves and when you stop it cough gets worse; this has happened two times already  - continue nexium once daily but take on empty stomach  - stop fosfamax - this medicine is good for bone health but can make acid reflux worse. We will tell you when you can restart it  - avoid colas, spices, cheeses, spirits, red meats, beer, chocolates, fried foods etc.,  - sleep with head end of bed elevated  - eat small  frequent meals  - do not go to bed for 3 hours after last meal  - at future visit will give you diet sheet  #Asthma  - continue Please start symbicort 160/4.5, 2 puff twice daily - take sample, script and show technique  #Followup  0- Please know that without you understanding your medications and taking it regularly your cough will never get better  - To get to know your medications better please visit asap with my nurse Tammy for med calendar  - I will see you in 4-6 weeks.  - any problems call or come sooner  - cough score at followup    OV 11/06/2011  Followup cough.  Cough is 100% better. Reports that ppi helps cough a   Lot. Twice when she stopped it cough  recurred. COmpliant with sinus, gerd and asthma instructions. Has completed med calendar with NP   Dr Gretta Cool Reflux Symptom Index (> 13-15 suggestive of LPR cough) 0 -> 5  =  none ->severe problem  Hoarseness of problem with voice 0  Clearing  Of Throat 2  Excess throat mucus or feeling of post nasal drip 0  Difficulty swallowing food, liquid or tablets 3  Cough after eating or lying down 0  Breathing difficulties or choking episodes 0  Troublesome or annoying cough 0  Sensation of something sticking in throat or lump in throat 0  Heartburn, chest pain, indigestion, or stomach acid coming up 0  TOTAL 5      Kouffman Reflux v Neurogenic Cough Differentiator Reflux Comments  Do you awaken from a sound sleep coughing violently?                            With trouble breathing?    Do you have choking episodes when you cannot  Get enough air, gasping for air ?                 Do you usually cough when you lie down into  The bed, or when you just lie down to rest ?                             Do you usually cough after meals or eating?         Yes   Do you cough when (or after) you bend over?    Yes   GERD SCORE  2   Kouffman Reflux v Neurogenic Cough Differentiator Neurogenic   Do you more-or-less cough all day long?    Does change of temperature make you cough?    Does laughing or chuckling cause you to cough? yes   Do fumes (perfume, automobile fumes, burned  Toast, etc.,) cause you to cough ?      yes   Does speaking, singing, or talking on the phone cause you to cough   ?                sometime  Neurogenic/Airway score 2    Past, Family, Social reviewed: no change since last visit   Review of Systems  Constitutional: Negative for fever and unexpected weight change.  HENT: Negative for ear pain, nosebleeds, congestion, sore throat, rhinorrhea, sneezing, trouble swallowing, dental problem, postnasal drip and sinus pressure.   Eyes: Negative for redness and itching.    Respiratory: Negative for cough, chest tightness, shortness of breath and wheezing.   Cardiovascular: Negative  for palpitations and leg swelling.  Gastrointestinal: Negative for nausea and vomiting.  Genitourinary: Negative for dysuria.  Musculoskeletal: Negative for joint swelling.  Skin: Negative for rash.  Neurological: Negative for headaches.  Hematological: Does not bruise/bleed easily.  Psychiatric/Behavioral: Negative for dysphoric mood. The patient is not nervous/anxious.        Objective:   Physical Exam Vitals reviewed. Constitutional: She is oriented to person, place, and time. She appears well-developed and well-nourished. No distress.  HENT:  Head: Normocephalic and atraumatic.  Right Ear: External ear normal.  Left Ear: External ear normal.  Mouth/Throat: Oropharynx is clear and moist. No oropharyngeal exudate.  Eyes: Conjunctivae and EOM are normal. Pupils are equal, round, and reactive to light. Right eye exhibits no discharge. Left eye exhibits no discharge. No scleral icterus.  Neck: Normal range of motion. Neck supple. No JVD present. No tracheal deviation present. No thyromegaly present.  Cardiovascular: Normal rate, regular rhythm, normal heart sounds and intact distal pulses.  Exam reveals no gallop and no friction rub.   No murmur heard. Pulmonary/Chest: Effort normal and breath sounds normal. No respiratory distress. She has no wheezes. She has no rales. She exhibits no tenderness.       Post nasal drip - improved/resolved  Abdominal: Soft. Bowel sounds are normal. She exhibits no distension and no mass. There is no tenderness. There is no rebound and no guarding.  Musculoskeletal: Normal range of motion. She exhibits no edema and no tenderness.  Lymphadenopathy:    She has no cervical adenopathy.  Neurological: She is alert and oriented to person, place, and time. She has normal reflexes. No cranial nerve deficit. She exhibits normal muscle tone. Coordination  normal.  Skin: Skin is warm and dry. No rash noted. She is not diaphoretic. No erythema. No pallor.  Psychiatric: She has a normal mood and affect. Her behavior is normal. Judgment and thought content normal.             Assessment & Plan:

## 2011-11-06 NOTE — Patient Instructions (Addendum)
Cough is from sinus drainage, acid reflux, asthma, and vocal cord dysfunction All of this is working together to cause cyclical cough/LPR cough Glad you are 100% better and cough almost gone Glad you did the med calendar with NP  #Sinus drainage  - continue generic fluticasone inhaler 2 squirts each nostril daily  - continue zyrtec once daily as before  #Acid Reflux  - you do have acid reflux causing cough based on you telling me that when you take PPI cough improves and when you stop it cough gets worse; this has happened two times already  - continue nexium once daily but take on empty stomach - continue to hold fosfamax - this medicine is good for bone health but can make acid reflux worse.   - Ask MOORE, DONALD, MD  To change you to RECLAST if safety profile and cost ok; this does not cause reflux   -  avoid colas, spices, cheeses, spirits, red meats, beer, chocolates, fried foods etc.,   - sleep with head end of bed elevated  - eat small frequent meals  - do not go to bed for 3 hours after last meal  - at future visit will give you diet sheet  #Asthma   - continue symbicort 160/4.5, 2 puff twice daily - take sample, script and show technique   #Followup  -  I will see you in 12 weeks.  - any problems call or come sooner  - cough score at followup  

## 2011-11-08 ENCOUNTER — Ambulatory Visit (HOSPITAL_COMMUNITY): Payer: Medicare Other

## 2011-11-09 NOTE — Assessment & Plan Note (Signed)
Cough is from sinus drainage, acid reflux, asthma, and vocal cord dysfunction All of this is working together to cause cyclical cough/LPR cough Glad you are 100% better and cough almost gone Glad you did the med calendar with NP  #Sinus drainage  - continue generic fluticasone inhaler 2 squirts each nostril daily  - continue zyrtec once daily as before  #Acid Reflux  - you do have acid reflux causing cough based on you telling me that when you take PPI cough improves and when you stop it cough gets worse; this has happened two times already  - continue nexium once daily but take on empty stomach - continue to hold fosfamax - this medicine is good for bone health but can make acid reflux worse.   - Ask Rudi Heap, MD  To change you to RECLAST if safety profile and cost ok; this does not cause reflux   -  avoid colas, spices, cheeses, spirits, red meats, beer, chocolates, fried foods etc.,   - sleep with head end of bed elevated  - eat small frequent meals  - do not go to bed for 3 hours after last meal  - at future visit will give you diet sheet  #Asthma   - continue symbicort 160/4.5, 2 puff twice daily - take sample, script and show technique   #Followup  -  I will see you in 12 weeks.  - any problems call or come sooner  - cough score at followup

## 2011-11-12 ENCOUNTER — Ambulatory Visit (HOSPITAL_COMMUNITY)
Admission: RE | Admit: 2011-11-12 | Discharge: 2011-11-12 | Disposition: A | Discharge status: Home / Self Care | Payer: Medicare Other | Source: Ambulatory Visit | Attending: Family Medicine | Admitting: Family Medicine

## 2011-11-12 DIAGNOSIS — J984 Other disorders of lung: Secondary | ICD-10-CM

## 2011-11-12 DIAGNOSIS — J438 Other emphysema: Secondary | ICD-10-CM | POA: Insufficient documentation

## 2011-11-12 DIAGNOSIS — R911 Solitary pulmonary nodule: Secondary | ICD-10-CM | POA: Insufficient documentation

## 2011-11-12 DIAGNOSIS — M19019 Primary osteoarthritis, unspecified shoulder: Secondary | ICD-10-CM | POA: Insufficient documentation

## 2011-11-22 HISTORY — PX: HAMMER TOE SURGERY: SHX385

## 2012-01-18 ENCOUNTER — Encounter (HOSPITAL_COMMUNITY): Payer: Self-pay | Admitting: Pharmacy Technician

## 2012-01-22 ENCOUNTER — Encounter (HOSPITAL_COMMUNITY): Payer: Self-pay

## 2012-01-22 ENCOUNTER — Ambulatory Visit (HOSPITAL_COMMUNITY)
Admission: RE | Admit: 2012-01-22 | Discharge: 2012-01-22 | Disposition: A | Discharge status: Home / Self Care | Payer: Medicare Other | Source: Ambulatory Visit | Attending: Ophthalmology | Admitting: Ophthalmology

## 2012-01-22 DIAGNOSIS — Z01812 Encounter for preprocedural laboratory examination: Secondary | ICD-10-CM | POA: Insufficient documentation

## 2012-01-22 DIAGNOSIS — I10 Essential (primary) hypertension: Secondary | ICD-10-CM | POA: Insufficient documentation

## 2012-01-22 DIAGNOSIS — H2510 Age-related nuclear cataract, unspecified eye: Secondary | ICD-10-CM | POA: Insufficient documentation

## 2012-01-22 LAB — BASIC METABOLIC PANEL
BUN: 9 mg/dL (ref 6–23)
CO2: 30 mEq/L (ref 19–32)
Chloride: 110 mEq/L (ref 96–112)
Creatinine, Ser: 1.04 mg/dL (ref 0.50–1.10)
Glucose, Bld: 68 mg/dL — ABNORMAL LOW (ref 70–99)
Potassium: 4.1 mEq/L (ref 3.5–5.1)

## 2012-01-22 LAB — HEMOGLOBIN AND HEMATOCRIT, BLOOD: Hemoglobin: 13.1 g/dL (ref 12.0–15.0)

## 2012-01-22 MED ORDER — CYCLOPENTOLATE HCL 1 % OP SOLN
OPHTHALMIC | Status: AC
  Filled 2012-01-22: qty 2

## 2012-01-22 NOTE — Patient Instructions (Addendum)
Your procedure is scheduled on:  Monday, 01/28/12  Report to [facility] at    800    AM.  Call this number if you have problems the morning of surgery: [phone]   Remember:   Do not eat or drink   After Midnight.  Take these medicines the morning of surgery with A SIP OF WATER: nexium, cozaar, zantac, symbicort, ultram if needed> Please bring your inhaler with you.   Do not wear jewelry, make-up or nail polish.  Do not wear lotions, powders, or perfumes. You may wear deodorant.  Do not bring valuables to the hospital.  Contacts, dentures or bridgework may not be worn into surgery.     Patients discharged the day of surgery will not be allowed to drive home.  Name and phone number of your driver: driver  Special Instructions: Use eye drops as directed.   Please read over the following fact sheets that you were given: Pain Booklet, Anesthesia Post-op Instructions and Care and Recovery After Surgery    Cataract Surgery  A cataract is a clouding of the lens of the eye. When a lens becomes cloudy, vision is reduced based on the degree and nature of the clouding. Surgery may be needed to improve vision. Surgery removes the cloudy lens and usually replaces it with a substitute lens (intraocular lens, IOL). LET YOUR EYE DOCTOR KNOW ABOUT:  Allergies to food or medicine.   Medicines taken including herbs, eyedrops, over-the-counter medicines, and creams.   Use of steroids (by mouth or creams).   Previous problems with anesthetics or numbing medicine.   History of bleeding problems or blood clots.   Previous surgery.   Other health problems, including diabetes and kidney problems.   Possibility of pregnancy, if this applies.  RISKS AND COMPLICATIONS  Infection.   Inflammation of the eyeball (endophthalmitis) that can spread to both eyes (sympathetic ophthalmia).   Poor wound healing.   If an IOL is inserted, it can later fall out of proper position. This is very uncommon.    Clouding of the part of your eye that holds an IOL in place. This is called an "after-cataract." These are uncommon, but easily treated.  BEFORE THE PROCEDURE  Do not eat or drink anything except small amounts of water for 8 to 12 before your surgery, or as directed by your caregiver.   Unless you are told otherwise, continue any eyedrops you have been prescribed.   Talk to your primary caregiver about all other medicines that you take (both prescription and non-prescription). In some cases, you may need to stop or change medicines near the time of your surgery. This is most important if you are taking blood-thinning medicine.Do not stop medicines unless you are told to do so.   Arrange for someone to drive you to and from the procedure.   Do not put contact lenses in either eye on the day of your surgery.  PROCEDURE There is more than one method for safely removing a cataract. Your doctor can explain the differences and help determine which is best for you. Phacoemulsification surgery is the most common form of cataract surgery.  An injection is given behind the eye or eyedrops are given to make this a painless procedure.   A small cut (incision) is made on the edge of the clear, dome-shaped surface that covers the front of the eye (cornea).   A tiny probe is painlessly inserted into the eye. This device gives off ultrasound waves that soften  and break up the cloudy center of the lens. This makes it easier for the cloudy lens to be removed by suction.   An IOL may be implanted.   The normal lens of the eye is covered by a clear capsule. Part of that capsule is intentionally left in the eye to support the IOL.   Your surgeon may or may not use stitches to close the incision.  There are other forms of cataract surgery that require a larger incision and stiches to close the eye. This approach is taken in cases where the doctor feels that the cataract cannot be easily removed using  phacoemulsification. AFTER THE PROCEDURE  When an IOL is implanted, it does not need care. It becomes a permanent part of your eye and cannot be seen or felt.   Your doctor will schedule follow-up exams to check on your progress.   Review your other medicines with your doctor to see which can be resumed after surgery.   Use eyedrops or take medicine as prescribed by your doctor.  Document Released: 04/05/2011 Document Reviewed: 04/02/2011 [facility] Patient Information 2012 [place], [place].  PATIENT INSTRUCTIONS POST-ANESTHESIA  IMMEDIATELY FOLLOWING SURGERY:  Do not drive or operate machinery for the first twenty four hours after surgery.  Do not make any important decisions for twenty four hours after surgery or while taking narcotic pain medications or sedatives.  If you develop intractable nausea and vomiting or a severe headache please notify your doctor immediately.  FOLLOW-UP:  Please make an appointment with your surgeon as instructed. You do not need to follow up with anesthesia unless specifically instructed to do so.  WOUND CARE INSTRUCTIONS (if applicable):  Keep a dry clean dressing on the anesthesia/puncture wound site if there is drainage.  Once the wound has quit draining you may leave it open to air.  Generally you should leave the bandage intact for twenty four hours unless there is drainage.  If the epidural site drains for more than 36-48 hours please call the anesthesia department.  QUESTIONS?:  Please feel free to call your physician or the hospital operator if you have any questions, and they will be happy to assist you.

## 2012-01-28 ENCOUNTER — Encounter (HOSPITAL_COMMUNITY): Payer: Self-pay | Admitting: Anesthesiology

## 2012-01-28 ENCOUNTER — Encounter (HOSPITAL_COMMUNITY)
Admission: RE | Disposition: A | Discharge status: Home / Self Care | Payer: Self-pay | Source: Ambulatory Visit | Attending: Ophthalmology

## 2012-01-28 ENCOUNTER — Ambulatory Visit (HOSPITAL_COMMUNITY)
Admission: RE | Admit: 2012-01-28 | Discharge: 2012-01-28 | Disposition: A | Discharge status: Home / Self Care | Payer: Medicare Other | Source: Ambulatory Visit | Attending: Ophthalmology | Admitting: Ophthalmology

## 2012-01-28 ENCOUNTER — Encounter (HOSPITAL_COMMUNITY): Payer: Self-pay | Admitting: *Deleted

## 2012-01-28 HISTORY — PX: CATARACT EXTRACTION W/PHACO: SHX586

## 2012-01-28 SURGERY — PHACOEMULSIFICATION, CATARACT, WITH IOL INSERTION
Anesthesia: Monitor Anesthesia Care | Site: Eye | Laterality: Right | Wound class: Clean

## 2012-01-28 MED ORDER — MIDAZOLAM HCL 2 MG/2ML IJ SOLN
INTRAMUSCULAR | Status: AC
  Filled 2012-01-28: qty 2

## 2012-01-28 MED ORDER — GATIFLOXACIN 0.5 % OP SOLN OPTIME - NO CHARGE
OPHTHALMIC | Status: DC | PRN
  Administered 2012-01-28: 1 [drp] via OPHTHALMIC

## 2012-01-28 MED ORDER — BSS IO SOLN
INTRAOCULAR | Status: DC | PRN
  Administered 2012-01-28: 15 mL via INTRAOCULAR

## 2012-01-28 MED ORDER — LACTATED RINGERS IV SOLN
INTRAVENOUS | Status: DC
  Administered 2012-01-28: 08:00:00 via INTRAVENOUS

## 2012-01-28 MED ORDER — MIDAZOLAM HCL 2 MG/2ML IJ SOLN
1.0000 mg | INTRAMUSCULAR | Status: DC | PRN
  Administered 2012-01-28: 2 mg via INTRAVENOUS

## 2012-01-28 MED ORDER — KETOROLAC TROMETHAMINE 0.4 % OP SOLN - NO CHARGE
1.0000 [drp] | Freq: Once | OPHTHALMIC | Status: AC
  Administered 2012-01-28: 1 [drp] via OPHTHALMIC
  Filled 2012-01-28: qty 5

## 2012-01-28 MED ORDER — BSS IO SOLN
INTRAOCULAR | Status: DC | PRN
  Administered 2012-01-28: 09:00:00

## 2012-01-28 MED ORDER — ONDANSETRON HCL 4 MG/2ML IJ SOLN: 4.0000 mg | Freq: Once | INTRAMUSCULAR | Status: DC | PRN

## 2012-01-28 MED ORDER — CYCLOPENTOLATE-PHENYLEPHRINE 0.2-1 % OP SOLN
1.0000 [drp] | Freq: Once | OPHTHALMIC | Status: AC
  Administered 2012-01-28: 1 [drp] via OPHTHALMIC

## 2012-01-28 MED ORDER — GATIFLOXACIN 0.5 % OP SOLN OPTIME - NO CHARGE
1.0000 [drp] | Freq: Once | OPHTHALMIC | Status: AC
  Administered 2012-01-28: 1 [drp] via OPHTHALMIC
  Filled 2012-01-28: qty 2.5

## 2012-01-28 MED ORDER — LIDOCAINE 3.5 % OP GEL OPTIME - NO CHARGE
OPHTHALMIC | Status: DC | PRN
  Administered 2012-01-28: 2 [drp] via OPHTHALMIC

## 2012-01-28 MED ORDER — FENTANYL CITRATE 0.05 MG/ML IJ SOLN: 25.0000 ug | INTRAMUSCULAR | Status: DC | PRN

## 2012-01-28 MED ORDER — LACTATED RINGERS IV SOLN
INTRAVENOUS | Status: DC | PRN
  Administered 2012-01-28: 08:00:00 via INTRAVENOUS

## 2012-01-28 MED ORDER — MIDAZOLAM HCL 5 MG/5ML IJ SOLN
INTRAMUSCULAR | Status: DC | PRN
  Administered 2012-01-28: 2 mg via INTRAVENOUS

## 2012-01-28 MED ORDER — EPINEPHRINE HCL 1 MG/ML IJ SOLN
INTRAMUSCULAR | Status: AC
  Filled 2012-01-28: qty 1

## 2012-01-28 MED ORDER — NA HYALUR & NA CHOND-NA HYALUR 0.55-0.5 ML IO KIT
PACK | INTRAOCULAR | Status: DC | PRN
  Administered 2012-01-28: 1 via OPHTHALMIC

## 2012-01-28 SURGICAL SUPPLY — 27 items
CAPSULAR TENSION RING-AMO (OPHTHALMIC RELATED) IMPLANT
CLOTH BEACON ORANGE TIMEOUT ST (SAFETY) ×2 IMPLANT
GLOVE BIO SURGEON STRL SZ7.5 (GLOVE) IMPLANT
GLOVE BIOGEL M 6.5 STRL (GLOVE) IMPLANT
GLOVE BIOGEL PI IND STRL 6.5 (GLOVE) ×1 IMPLANT
GLOVE BIOGEL PI IND STRL 7.0 (GLOVE) IMPLANT
GLOVE BIOGEL PI INDICATOR 6.5 (GLOVE) ×1
GLOVE BIOGEL PI INDICATOR 7.0 (GLOVE)
GLOVE ECLIPSE 6.5 STRL STRAW (GLOVE) IMPLANT
GLOVE ECLIPSE 7.5 STRL STRAW (GLOVE) IMPLANT
GLOVE EXAM NITRILE LRG STRL (GLOVE) IMPLANT
GLOVE EXAM NITRILE MD LF STRL (GLOVE) ×2 IMPLANT
GLOVE SKINSENSE NS SZ6.5 (GLOVE)
GLOVE SKINSENSE NS SZ7.0 (GLOVE)
GLOVE SKINSENSE STRL SZ6.5 (GLOVE) IMPLANT
GLOVE SKINSENSE STRL SZ7.0 (GLOVE) IMPLANT
INST SET CATARACT ~~LOC~~ (KITS) ×2 IMPLANT
KIT VITRECTOMY (OPHTHALMIC RELATED) IMPLANT
PAD ARMBOARD 7.5X6 YLW CONV (MISCELLANEOUS) ×2 IMPLANT
PROC W NO LENS (INTRAOCULAR LENS)
PROC W SPEC LENS (INTRAOCULAR LENS)
PROCESS W NO LENS (INTRAOCULAR LENS) IMPLANT
PROCESS W SPEC LENS (INTRAOCULAR LENS) IMPLANT
RING MALYGIN (MISCELLANEOUS) IMPLANT
SIGHTPATH CAT PROC W REG LENS (Ophthalmic Related) ×2 IMPLANT
VISCOELASTIC ADDITIONAL (OPHTHALMIC RELATED) IMPLANT
WATER STERILE IRR 250ML POUR (IV SOLUTION) ×2 IMPLANT

## 2012-01-28 NOTE — Transfer of Care (Signed)
  Anesthesia Post-op Note  Patient: [name]  Procedure(s) Performed: Procedure(s) (LRB) with comments: CATARACT EXTRACTION PHACO AND INTRAOCULAR LENS PLACEMENT (IOC) (Right) - CDI:  Patient Location: PACU  Anesthesia Type: MAC  Level of Consciousness: awake, alert , oriented and patient cooperative  Airway and Oxygen Therapy: Patient Spontanous Breathing and Patient connected to face mask oxygen  Post-op Pain: mild  Post-op Assessment: Post-op Vital signs reviewed, Patient's Cardiovascular Status Stable, Respiratory Function Stable, Patent Airway and No signs of Nausea or vomiting  Post-op Vital Signs: Reviewed and stable  Complications: No apparent anesthesia complications

## 2012-01-28 NOTE — Anesthesia Postprocedure Evaluation (Signed)
  Anesthesia Post-op Note  Patient: [name]  Procedure(s) Performed: Procedure(s) (LRB) with comments: CATARACT EXTRACTION PHACO AND INTRAOCULAR LENS PLACEMENT (IOC) (Right) - CDI:  Patient Location: PACU  Anesthesia Type: MAC  Level of Consciousness: sedated  Airway and Oxygen Therapy: Patient Spontanous Breathing  Post-op Pain: none  Post-op Assessment: Post-op Vital signs reviewed, Patient's Cardiovascular Status Stable, Respiratory Function Stable, Patent Airway and No signs of Nausea or vomiting  Post-op Vital Signs: Reviewed and stable  Complications: No apparent anesthesia complications

## 2012-01-28 NOTE — Brief Op Note (Signed)
01/28/2012  12:18 PM  PATIENT:  [name]  77 y.o. female  PRE-OPERATIVE DIAGNOSIS:  nuclear cataract right eye  POST-OPERATIVE DIAGNOSIS:  nuclear cataract right eye  PROCEDURE:  Procedure(s): CATARACT EXTRACTION PHACO AND INTRAOCULAR LENS PLACEMENT (IOC)  SURGEON:  Surgeon(s): Susa Simmonds, MD  ASSISTANTS:  Marya Landry, CST  ANESTHESIA STAFF: Marolyn Hammock, CRNA - CRNA Laurene Footman, MD - Anesthesiologist  ANESTHESIA:   topical and MAC  REQUESTED LENS POWER: 24.5  LENS IMPLANT INFORMATION:  [employer]con [id number] ser# [id number]  Exp 03/17  CUMULATIVE DISSIPATED ENERGY:8.15  INDICATIONS:see H&P  OP FINDINGS:dense NS  COMPLICATIONS:None  DICTATION #: see scanned note  PLAN OF CARE: see H&P  PATIENT DISPOSITION:  Short Stay

## 2012-01-28 NOTE — Op Note (Signed)
See scanned op note done today 

## 2012-01-28 NOTE — Anesthesia Procedure Notes (Signed)
Procedure Name: MAC Date/Time: 01/28/2012 8:33 AM Performed by: Carolyne Littles, [name] L Pre-anesthesia Checklist: Patient identified, Patient being monitored, Emergency Drugs available, Timeout performed and Suction available Patient Re-evaluated:Patient Re-evaluated prior to inductionOxygen Delivery Method: Nasal cannula

## 2012-01-28 NOTE — H&P (Signed)
I have reviewed the pre printed H&P, the patient was re-examined, and I have identified no significant interval changes in the patient's medical condition.  There is no change in the plan of care since the history and physical of record. 

## 2012-01-28 NOTE — Preoperative (Signed)
Beta Blockers   Reason not to administer Beta Blockers:Not Applicable 

## 2012-01-28 NOTE — Anesthesia Preprocedure Evaluation (Signed)
Anesthesia Evaluation  Patient identified by MRN, date of birth, ID band Patient awake    Reviewed: Allergy & Precautions, H&P , NPO status , Patient's Chart, lab work & pertinent test results, reviewed documented beta blocker date and time   History of Anesthesia Complications Negative for: history of anesthetic complications  Airway Mallampati: I      Dental  (+) Edentulous Upper and Edentulous Lower   Pulmonary shortness of breath, Current Smoker,    Pulmonary exam normal       Cardiovascular hypertension, Pt. on medications Rhythm:Regular Rate:Normal     Neuro/Psych PSYCHIATRIC DISORDERS Depression    GI/Hepatic GERD-  Medicated and Controlled,(+) Hepatitis - (pt not aware of hx), B  Endo/Other    Renal/GU      Musculoskeletal   Abdominal   Peds  Hematology   Anesthesia Other Findings   Reproductive/Obstetrics                           Anesthesia Physical Anesthesia Plan  ASA: III  Anesthesia Plan: MAC   Post-op Pain Management:    Induction: Intravenous  Airway Management Planned: Nasal Cannula  Additional Equipment:   Intra-op Plan:   Post-operative Plan:   Informed Consent: I have reviewed the patients History and Physical, chart, labs and discussed the procedure including the risks, benefits and alternatives for the proposed anesthesia with the patient or authorized representative who has indicated his/her understanding and acceptance.     Plan Discussed with:   Anesthesia Plan Comments:         Anesthesia Quick Evaluation

## 2012-01-30 ENCOUNTER — Encounter (HOSPITAL_COMMUNITY): Payer: Self-pay | Admitting: Ophthalmology

## 2012-02-21 ENCOUNTER — Ambulatory Visit: Payer: Medicare Other | Admitting: Internal Medicine

## 2012-02-29 ENCOUNTER — Ambulatory Visit: Payer: Medicare Other | Admitting: Internal Medicine

## 2012-03-13 ENCOUNTER — Ambulatory Visit: Payer: Medicare Other | Admitting: Internal Medicine

## 2012-04-17 ENCOUNTER — Ambulatory Visit: Payer: Medicare Other | Admitting: Internal Medicine

## 2012-05-05 ENCOUNTER — Other Ambulatory Visit: Payer: Self-pay | Admitting: Urology

## 2012-05-07 ENCOUNTER — Ambulatory Visit: Payer: Medicare Other | Admitting: Internal Medicine

## 2012-05-22 ENCOUNTER — Other Ambulatory Visit (HOSPITAL_COMMUNITY): Payer: Self-pay | Admitting: Neurosurgery

## 2012-05-22 ENCOUNTER — Encounter: Payer: Self-pay | Admitting: Internal Medicine

## 2012-05-22 ENCOUNTER — Ambulatory Visit (INDEPENDENT_AMBULATORY_CARE_PROVIDER_SITE_OTHER): Payer: Medicare Other | Admitting: Internal Medicine

## 2012-05-22 VITALS — BP 110/70 | HR 76 | Temp 97.7°F | Ht 64.0 in | Wt 145.6 lb

## 2012-05-22 DIAGNOSIS — R05 Cough: Secondary | ICD-10-CM

## 2012-05-22 DIAGNOSIS — M47817 Spondylosis without myelopathy or radiculopathy, lumbosacral region: Secondary | ICD-10-CM

## 2012-05-22 DIAGNOSIS — Z9889 Other specified postprocedural states: Secondary | ICD-10-CM

## 2012-05-22 NOTE — Patient Instructions (Addendum)
Cough is from sinus drainage, acid reflux, asthma, and vocal cord dysfunction All of this is working together to cause cyclical cough/LPR cough Glad you are 100% better and cough  gone   #Sinus drainage  - continue generic fluticasone inhaler 2 squirts each nostril daily  - continue zyrtec once daily as before  #Acid Reflux  -- continue nexium once daily but take on empty stomach - continue to hold fosfamax - this medicine is good for bone health but can make acid reflux worse.   - Ask Rudi Heap, MD  To change you to RECLAST if safety profile and cost ok; this does not cause reflux   -  avoid colas, spices, cheeses, spirits, red meats, beer, chocolates, fried foods etc.,   - sleep with head end of bed elevated  - eat small frequent meals  - do not go to bed for 3 hours after last meal  - at future visit will give you diet sheet  #Asthma   - decrease symbicort  From 160/4.5 dose to 80/4.5 dose, 2 puff twice daily ; CMA wil ensure refill   #Followup  -  I will see you in 12 months - At followup if cough still under control then we can look at cutting down the dose of Symbicort further - any problems call or come sooner  - cough score at followup

## 2012-05-22 NOTE — Assessment & Plan Note (Signed)
Her cough is completely resolved. This is by acting against the sinuses, as reflux and asthma. Of all these measures Nexium for acid reflux helps the most. Have advised her to continue all measures against all 3 entities. Main change today is to decrease the dose of Symbicort from the 160 dose to 80 dose. Will see her in one year

## 2012-05-22 NOTE — Progress Notes (Signed)
Subjective:    Patient ID: [name], female    DOB: 1935/01/24, 78 y.o.   MRN: [id number]  HPI IOV 06/21/2011  PMD is Dr Birdena Jubilee in [place], [place]  77 year old female referred for chronic cough. Ex-smoker. STarted smoking 20 years ago. Quit 05/01/11. SMoked 3-4 cigs per day.   Reports chronic cough x 2 years. Current RSI cough score 28 - level 5 clearing of throat, post nasal drop, cough after lying down and annoying cough. LEvel 4 - hoarseness of voice and heart burn.  Lot of Rx tried per hx. . Has seen pulmonary  V ENT at [facility] v both (Dr Macon Large).  Very poor historian. Says Nexium for GERD in Oct 2012 helped some and "real good" for 30 days but then did not work but still continuing with it. . Denies asthma diagnosis. Has lot of post nasal drainage - is biggest symptom for her. Prior ACE inhibitor has been discontinued. She says diagnosis for cough was GERD but patient thinks is is sinus drainage which apparently was not given as etiology. Of note, she is on chronic elavil/amitriptyline which she says she takes a night for "blood pressure"  (I reminded her is for neuropathic pain v depression  And she was unsure). Noted to be on symbicort on med list but she is off it for 2 months because MD said she did not need it. Currently taking only albuterol prn which she is needing it twice a week. Med list has zyrtec but she says she is not taking it because MD said she did not need it. Overall states very confused with medications in terms of schedule and indications   REC Cause for cough is not fully clear to me  Please sign release to get your records from [facility]  Please have methacholine challenge test - for this test you cannot be on symbicort for 2-3 weeks, so do not start it  Please have sinus ct without contrast for chronic cough  REturn to see me after above < 1-2 weeks  Ov 07/26/2011 [place] chronic cough. Cough reportedly better but subjective score shows cough is  unchanged. RSI cough score is still 28 with (level 5 clearing of throat, choking episodes, and heartburn Level 4 - troublesome cough, post nasal drip, Level 3 hoarseness voice, cough after lying down). Worksup so fare - CT sinsus 06/28/11 is negagtive though she still has lot of sinus drainage. Methacholine challenge test 07/19/11 is positive at 0.25 level and is suggestive of asthma.   REC  Cough is from sinus drainage, acid reflux, asthma, and vocal cord dysfunction  All of this is working together to cause cyclical cough/LPR cough  #Sinus drainage  - CT scan sinus is normal but you do have sinus drainage  - start take generic fluticasone inhaler 2 squirts each nostril daily  - continue zyrtec once daily as before  #Acid Reflux  - you do have acid reflux causing cough based on you telling me that when you take it cough improves and when you stop it cough gets worse; this has happened two times already  - continue nexium once daily but take on empty stomach  - stop fosfamax - this medicine is good for bone health but can make acid reflux worse. We will tell you when you can restart it  - avoid colas, spices, cheeses, spirits, red meats, beer, chocolates, fried foods etc.,  - sleep with head end of bed elevated  - eat small  frequent meals  - do not go to bed for 3 hours after last meal  - at future visit will give you diet sheet  #Asthma  - continue Please start symbicort 160/4.5, 2 puff twice daily - take sample, script and show technique  #Followup  0- Please know that without you understanding your medications and taking it regularly your cough will never get better  - To get to know your medications better please visit asap with my nurse Tammy for med calendar  - I will see you in 4-6 weeks.  - any problems call or come sooner  - cough score at followup    OV 11/06/2011  Followup cough.  Cough is 100% better. Reports that ppi helps cough a   Lot. Twice when she stopped it cough  recurred. COmpliant with sinus, gerd and asthma instructions. Has completed med calendar with NP    Cough is from sinus drainage, acid reflux, asthma, and vocal cord dysfunction  All of this is working together to cause cyclical cough/LPR cough  Glad you are 100% better and cough almost gone  Glad you did the med calendar with NP  #Sinus drainage  - continue generic fluticasone inhaler 2 squirts each nostril daily  - continue zyrtec once daily as before  #Acid Reflux  - you do have acid reflux causing cough based on you telling me that when you take PPI cough improves and when you stop it cough gets worse; this has happened two times already  - continue nexium once daily but take on empty stomach  - continue to hold fosfamax - this medicine is good for bone health but can make acid reflux worse.  - Ask Rudi Heap, MD To change you to RECLAST if safety profile and cost ok; this does not cause reflux  - avoid colas, spices, cheeses, spirits, red meats, beer, chocolates, fried foods etc.,  - sleep with head end of bed elevated  - eat small frequent meals  - do not go to bed for 3 hours after last meal  - at future visit will give you diet sheet  #Asthma  - continue symbicort 160/4.5, 2 puff twice daily - take sample, script and show technique  #Followup  - I will see you in 12 weeks.  - any problems call or come sooner  - cough score at followup    OV 05/22/2012   Followup chronic cough. After last visit in July 2013 she continues to do well. In fact her cough is completely resolved after her last visit. She is very compliant with all instructions of sinus drainage, acid reflux, asthma. Of all these she feels that the acid reflux treatment helps the most. No new issues. RSI cough score is now 0  Dr Gretta Cool Reflux Symptom Index (> 13-15 suggestive of LPR cough) 0 -> 5  =  none ->severe problem 11/06/11 05/22/2012   Hoarseness of problem with voice 0 0  Clearing  Of Throat 2 0    Excess throat mucus or feeling of post nasal drip 0 0  Difficulty swallowing food, liquid or tablets 3 0  Cough after eating or lying down 0 0  Breathing difficulties or choking episodes 0 0  Troublesome or annoying cough 0 0  Sensation of something sticking in throat or lump in throat 0 0  Heartburn, chest pain, indigestion, or stomach acid coming up 0 0  TOTAL 5 0      Kouffman Reflux v Neurogenic Cough Differentiator Reflux  11/06/11 05/22/2012   Do you awaken from a sound sleep coughing violently?                            With trouble breathing?  n  Do you have choking episodes when you cannot  Get enough air, gasping for air ?               n  Do you usually cough when you lie down into  The bed, or when you just lie down to rest ?                           n  Do you usually cough after meals or eating?         Yes n  Do you cough when (or after) you bend over?    Yes n  GERD SCORE  2 0  Kouffman Reflux v Neurogenic Cough Differentiator Neurogenic   Do you more-or-less cough all day long?  n  Does change of temperature make you cough?  n  Does laughing or chuckling cause you to cough? yes n  Do fumes (perfume, automobile fumes, burned  Toast, etc.,) cause you to cough ?      yes n  Does speaking, singing, or talking on the phone cause you to cough   ?               sometime sn  Neurogenic/Airway score 2 0   Past, Family, Social reviewed: no change since last visit     Review of Systems  Constitutional: Negative for fever and unexpected weight change.  HENT: Negative for ear pain, nosebleeds, congestion, sore throat, rhinorrhea, sneezing, trouble swallowing, dental problem, postnasal drip and sinus pressure.   Eyes: Negative for redness and itching.  Respiratory: Negative for cough, chest tightness, shortness of breath and wheezing.   Cardiovascular: Negative for palpitations and leg swelling.  Gastrointestinal: Negative for nausea and vomiting.  Genitourinary:  Negative for dysuria.  Musculoskeletal: Negative for joint swelling.  Skin: Negative for rash.  Neurological: Negative for headaches.  Hematological: Does not bruise/bleed easily.  Psychiatric/Behavioral: Negative for dysphoric mood. The patient is not nervous/anxious.       Current outpatient prescriptions:amitriptyline (ELAVIL) 25 MG tablet, Take 25 mg by mouth at bedtime.  , Disp: , Rfl: ;  aspirin EC 81 MG tablet, Take 81 mg by mouth daily.  , Disp: , Rfl: ;  budesonide-formoterol (SYMBICORT) 160-4.5 MCG/ACT inhaler, Inhale 2 puffs into the lungs 2 (two) times daily. , Disp: , Rfl: ;  cetirizine (ZYRTEC) 10 MG tablet, Take 10 mg by mouth daily., Disp: , Rfl:  cholecalciferol (VITAMIN D) 1000 UNITS tablet, Take 1,000 Units by mouth daily., Disp: , Rfl: ;  esomeprazole (NEXIUM) 40 MG capsule, Take 1 capsule (40 mg total) by mouth daily before breakfast., Disp: 30 capsule, Rfl: 11;  fluticasone (FLONASE) 50 MCG/ACT nasal spray, Place 2 sprays into the nose daily., Disp: 16 g, Rfl: 2;  furosemide (LASIX) 20 MG tablet, Take 10 mg by mouth daily.  , Disp: , Rfl:  HYDROcodone-acetaminophen (NORCO) 10-325 MG per tablet, Take 1 tablet by mouth Three times daily as needed., Disp: , Rfl: ;  losartan (COZAAR) 50 MG tablet, Take 50 mg by mouth daily., Disp: , Rfl: ;  Multiple Vitamins-Minerals (MULTIVITAMIN PO), Take 1 capsule by mouth daily., Disp: , Rfl: ;  ranitidine (ZANTAC) 150 MG  tablet, Take 150 mg by mouth daily. , Disp: , Rfl:  simvastatin (ZOCOR) 10 MG tablet, Take 10 mg by mouth at bedtime.  , Disp: , Rfl:   Objective:   Physical Exam Vitals reviewed. Constitutional: She is oriented to person, place, and time. She appears well-developed and well-nourished. No distress.  HENT:  Head: Normocephalic and atraumatic.  Right Ear: External ear normal.  Left Ear: External ear normal.  Mouth/Throat: Oropharynx is clear and moist. No oropharyngeal exudate.  Eyes: Conjunctivae and EOM are normal. Pupils  are equal, round, and reactive to light. Right eye exhibits no discharge. Left eye exhibits no discharge. No scleral icterus.  Neck: Normal range of motion. Neck supple. No JVD present. No tracheal deviation present. No thyromegaly present.  Cardiovascular: Normal rate, regular rhythm, normal heart sounds and intact distal pulses.  Exam reveals no gallop and no friction rub.   No murmur heard. Pulmonary/Chest: Effort normal and breath sounds normal. No respiratory distress. She has no wheezes. She has no rales. She exhibits no tenderness.       Post nasal drip -resolved  Abdominal: Soft. Bowel sounds are normal. She exhibits no distension and no mass. There is no tenderness. There is no rebound and no guarding.  Musculoskeletal: Normal range of motion. She exhibits no edema and no tenderness.  Lymphadenopathy:    She has no cervical adenopathy.  Neurological: She is alert and oriented to person, place, and time. She has normal reflexes. No cranial nerve deficit. She exhibits normal muscle tone. Coordination normal.  Skin: Skin is warm and dry. No rash noted. She is not diaphoretic. No erythema. No pallor.  Psychiatric: She has a normal mood and affect. Her behavior is normal. Judgment and thought content normal.         Assessment & Plan:

## 2012-05-23 ENCOUNTER — Encounter (HOSPITAL_BASED_OUTPATIENT_CLINIC_OR_DEPARTMENT_OTHER): Payer: Self-pay | Admitting: *Deleted

## 2012-05-23 NOTE — Progress Notes (Signed)
To [facility] at 0730- Istat on arrival,Ekg,chest CT in epic.Npo after Mn-will take zantac,nexium,cozaar with small amt water-use her symbicort that am.

## 2012-05-26 ENCOUNTER — Ambulatory Visit (HOSPITAL_COMMUNITY)
Admission: RE | Admit: 2012-05-26 | Discharge: 2012-05-26 | Disposition: A | Discharge status: Home / Self Care | Payer: Medicare Other | Source: Ambulatory Visit | Attending: Neurosurgery | Admitting: Neurosurgery

## 2012-05-26 ENCOUNTER — Encounter (HOSPITAL_COMMUNITY): Payer: Self-pay

## 2012-05-26 DIAGNOSIS — Z9889 Other specified postprocedural states: Secondary | ICD-10-CM

## 2012-05-26 DIAGNOSIS — M545 Low back pain, unspecified: Secondary | ICD-10-CM | POA: Insufficient documentation

## 2012-05-26 DIAGNOSIS — R209 Unspecified disturbances of skin sensation: Secondary | ICD-10-CM | POA: Insufficient documentation

## 2012-05-26 DIAGNOSIS — M25559 Pain in unspecified hip: Secondary | ICD-10-CM | POA: Insufficient documentation

## 2012-05-26 DIAGNOSIS — M47817 Spondylosis without myelopathy or radiculopathy, lumbosacral region: Secondary | ICD-10-CM

## 2012-05-26 DIAGNOSIS — M7130 Other bursal cyst, unspecified site: Secondary | ICD-10-CM | POA: Insufficient documentation

## 2012-05-26 DIAGNOSIS — M5126 Other intervertebral disc displacement, lumbar region: Secondary | ICD-10-CM | POA: Insufficient documentation

## 2012-05-26 LAB — POCT I-STAT, CHEM 8
Calcium, Ion: 1.2 mmol/L (ref 1.13–1.30)
Chloride: 110 mEq/L (ref 96–112)
Creatinine, Ser: 1.1 mg/dL (ref 0.50–1.10)
Glucose, Bld: 86 mg/dL (ref 70–99)
HCT: 43 % (ref 36.0–46.0)
Potassium: 4 mEq/L (ref 3.5–5.1)

## 2012-05-26 MED ORDER — GADOBENATE DIMEGLUMINE 529 MG/ML IV SOLN
13.0000 mL | Freq: Once | INTRAVENOUS | Status: AC | PRN
  Administered 2012-05-26: 13 mL via INTRAVENOUS

## 2012-05-26 NOTE — Progress Notes (Signed)
Blood sample obtained from right arm IV for Creatnine level.  

## 2012-05-28 NOTE — H&P (Signed)
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 She had moderate frequency. Dr. Retta Diones in the past treated her for a painful bladder, and she failed Elavil, VESIcare, and Detrol. She has suprapubic pressure and an uncomfortable burning sensation in the suprapubic area that is relieved a little bit with voiding. She also feels a little bit of vaginal bulging, and tries to reduce it at times. On pelvic examination she had a grade 3 cystocele that just reaches the introitus. She had a central defect. Vaginal cuff descended at 3 cm. She has no atrophy. She emptied efficiently. She has an intermittent flow pattern. I thought that she might have interstitial cystitis. Urodynamics were done to sort out her voiding dysfunction, and to shed light on possible diagnosis of IC. I thought if she had surgery she would likely require a transvaginal cystocele repair with _____ graft. Pelvic pain will have to be sorted out prior to this. Though she can feel her prolapse, I did not feel it was her main complaint.   Review of Systems: No other change in bowel or neurologic systems.   She was here to discuss her urodynamics.   She did not void prior to the study and was catheterized for 40 mL. Maximum capacity was 270 mL. Her bladder was unstable reaching 115 cm of water, but she did not leak. Her urgency decreased some with vaginal packing. She had bladder fullness at capacity and not pain. With pressures of 110 cm of water prior to reduction of cystocele she does not leak. With reduction of the cystocele she does not leak with the pressure at 81 cm of water. During voluntary voiding she voided 249 mL with maximum voiding of 18 mL per second. Maximum voiding pressure was 49 cm of water. She emptied efficiently. EMG activity is normal. Bladder neck descended to 3 cm. She had spinal hardware. Her bladder was somewhat  trabeculated. Details of urodynamics were signed and dictated on the urodynamic sheet.    Past Medical History Problems  1. History of  Anxiety (Symptom) 799.2 2. History of  Arthritis V13.4 3. History of  Arthritis V13.4 4. History of  Depression 311 5. History of  Esophageal Reflux 530.81 6. History of  Esophageal Reflux 530.81 7. History of  Hypercholesterolemia 272.0 8. History of  Hypertension 401.9 9. History of  Shortness Of Breath 786.05  Surgical History Problems  1. History of  Appendectomy 2. History of  Appendectomy 3. History of  Back Surgery 4. History of  Cardiovascular Stress Test 5. History of  Cholecystectomy 6. History of  Eye Surgery 7. History of  Foot Surgery 8. History of  Foot Surgery 9. History of  Gallbladder Surgery 10. History of  Hemorrhoidectomy 11. History of  Hysterectomy V45.77 12. History of  Hysterectomy V45.77 13. History of  Knee Surgery 14. History of  Rotator Cuff Repair 15. History of  Tonsillectomy  Current Meds 1. Amitriptyline HCl 25 MG Oral Tablet; TAKE ONE TABLET BY MOUTH AT BEDTIME; Therapy:  07Jun2010 to (Last Rx:02Sep2010) 2. Aspirin 81 MG Oral Tablet; Therapy: (Recorded:31Dec2008) to 3. Evista 60 MG Oral Tablet; Therapy: 21Aug2013 to 4. Furosemide 20 MG Oral Tablet; Therapy: 14Aug2013 to 5. Losartan Potassium 50 MG Oral Tablet; Therapy: 05Apr2013 to 6. NexIUM 40 MG Oral Capsule Delayed Release; Therapy: 10Jan2013 to 7. Ranitidine HCl 150 MG Oral Tablet; Therapy: 04Oct2012 to 8. Simvastatin 10 MG Oral Tablet; Therapy: 28Jan2013 to  9. Symbicort 160-4.5 MCG/ACT Inhalation Aerosol; Therapy: 10Oct2013 to 10. TraMADol HCl 50 MG Oral Tablet; Therapy: 17Jun2013 to 11. Vitamin D 56213 UNIT CAPS; Therapy: (Recorded:31Dec2008) to  Allergies Medication  1. Codeine Derivatives 2. Penicillins  Family History Problems  1. Family history of  Family Health Status Number Of Children 2 sons, 3 daughters 2. Family history of   Urologic Disorder V18.7 Kidney stones  Social History Problems  1. Caffeine Use 3 cups daily 2. Family history of  Death In The Family Mother Age 28, alzheimmer's 3. Marital History - Divorced 4. Occupation: Retired 5. Tobacco Use V15.82 1 pack per week for 40 years. Denied  6. Alcohol Use  Assessment Assessed  1. Urge And Stress Incontinence 788.33 2. Cystocele 596.89 3. Chronic Cystitis 595.2  Plan Chronic Cystitis (595.2)  1. Follow-up Schedule Surgery Office  Follow-up  Requested for: 03Jan2014  Discussion/Summary   [name] has symptomatic prolapse. She may have interstitial cystitis. She has mixed incontinence, but in my opinion primarily an overactive bladder component. She certainly did not have a significant outlet abnormality on urodynamics.   I drew Ms. Beranek a picture. I asked her what was bothering her the most, and she said it is the suprapubic pressure and discomfort and frequency.   She understands that her cystocele is causing vaginal bulging sensation, but she has always been a little bit nonspecific in describing a true symptom. She understands that by history she has a mild outlet abnormality, but her primary problem with frequency and urgency incontinence is an overactive bladder. Thirdly, we talked about her suprapubic discomfort and pressure, and the fact that Dr. Retta Diones thought she might have interstitial cystitis.   We talked about cystoscopy/hydrodistension and instillation in detail. Pros, cons, general surgical and anesthetic risks, and other options including watchful waiting were discussed. Risks were described but not limited to pain, infection, and bleeding. The risk of bladder perforation and management were discussed. The patient understands that it is primarily a diagnostic procedure.   She understands that she should not have prolapse repair at this stage since it is not causing a lot of her symptoms. I agree that it could improve her  flow, which was improved during her urodynamics. Operating in the face of the pain syndrome undiagnosed is also not something I recommend, and this is discussed.   Our initial goal is to do the hydrodistension and then try to sort out her abdominal complaints and incontinence, which would be treated medically. Long term she may need prolapse surgery, or even consideration for a pessary.   After a thorough review of the management options for the patient's condition the patient  elected to proceed with surgical therapy as noted above. We have discussed the potential benefits and risks of the procedure, side effects of the proposed treatment, the likelihood of the patient achieving the goals of the procedure, and any potential problems that might occur during the procedure or recuperation. Informed consent has been obtained.

## 2012-05-29 ENCOUNTER — Encounter (HOSPITAL_BASED_OUTPATIENT_CLINIC_OR_DEPARTMENT_OTHER): Payer: Self-pay | Admitting: Anesthesiology

## 2012-05-29 ENCOUNTER — Encounter (HOSPITAL_BASED_OUTPATIENT_CLINIC_OR_DEPARTMENT_OTHER): Payer: Self-pay | Admitting: *Deleted

## 2012-05-29 ENCOUNTER — Encounter (HOSPITAL_BASED_OUTPATIENT_CLINIC_OR_DEPARTMENT_OTHER)
Admission: RE | Disposition: A | Discharge status: Home / Self Care | Payer: Self-pay | Source: Ambulatory Visit | Attending: Urology

## 2012-05-29 ENCOUNTER — Ambulatory Visit (HOSPITAL_BASED_OUTPATIENT_CLINIC_OR_DEPARTMENT_OTHER): Payer: Medicare Other | Admitting: Anesthesiology

## 2012-05-29 ENCOUNTER — Ambulatory Visit (HOSPITAL_BASED_OUTPATIENT_CLINIC_OR_DEPARTMENT_OTHER)
Admission: RE | Admit: 2012-05-29 | Discharge: 2012-05-29 | Disposition: A | Discharge status: Home / Self Care | Payer: Medicare Other | Source: Ambulatory Visit | Attending: Urology | Admitting: Urology

## 2012-05-29 DIAGNOSIS — N3946 Mixed incontinence: Secondary | ICD-10-CM | POA: Insufficient documentation

## 2012-05-29 DIAGNOSIS — I10 Essential (primary) hypertension: Secondary | ICD-10-CM | POA: Insufficient documentation

## 2012-05-29 DIAGNOSIS — Z79899 Other long term (current) drug therapy: Secondary | ICD-10-CM | POA: Insufficient documentation

## 2012-05-29 DIAGNOSIS — E7800 Pure hypercholesterolemia, unspecified: Secondary | ICD-10-CM | POA: Insufficient documentation

## 2012-05-29 DIAGNOSIS — N3020 Other chronic cystitis without hematuria: Secondary | ICD-10-CM | POA: Insufficient documentation

## 2012-05-29 DIAGNOSIS — Z90710 Acquired absence of both cervix and uterus: Secondary | ICD-10-CM | POA: Insufficient documentation

## 2012-05-29 DIAGNOSIS — N8111 Cystocele, midline: Secondary | ICD-10-CM | POA: Insufficient documentation

## 2012-05-29 DIAGNOSIS — K219 Gastro-esophageal reflux disease without esophagitis: Secondary | ICD-10-CM | POA: Insufficient documentation

## 2012-05-29 DIAGNOSIS — N949 Unspecified condition associated with female genital organs and menstrual cycle: Secondary | ICD-10-CM | POA: Insufficient documentation

## 2012-05-29 HISTORY — PX: CYSTO WITH HYDRODISTENSION: SHX5453

## 2012-05-29 LAB — POCT I-STAT, CHEM 8
Calcium, Ion: 1.27 mmol/L (ref 1.13–1.30)
Chloride: 110 mEq/L (ref 96–112)
Glucose, Bld: 89 mg/dL (ref 70–99)
HCT: 42 % (ref 36.0–46.0)

## 2012-05-29 SURGERY — CYSTOSCOPY, WITH BLADDER HYDRODISTENSION
Anesthesia: General | Site: Bladder | Wound class: Clean Contaminated

## 2012-05-29 MED ORDER — CIPROFLOXACIN IN D5W 400 MG/200ML IV SOLN
400.0000 mg | INTRAVENOUS | Status: AC
  Administered 2012-05-29: 400 mg via INTRAVENOUS
  Filled 2012-05-29: qty 200

## 2012-05-29 MED ORDER — PROPOFOL 10 MG/ML IV BOLUS
INTRAVENOUS | Status: DC | PRN
  Administered 2012-05-29: 160 mg via INTRAVENOUS

## 2012-05-29 MED ORDER — KETOROLAC TROMETHAMINE 30 MG/ML IJ SOLN
15.0000 mg | Freq: Once | INTRAMUSCULAR | Status: DC | PRN
  Filled 2012-05-29: qty 1

## 2012-05-29 MED ORDER — LIDOCAINE HCL (CARDIAC) 20 MG/ML IV SOLN
INTRAVENOUS | Status: DC | PRN
  Administered 2012-05-29: 60 mg via INTRAVENOUS

## 2012-05-29 MED ORDER — LACTATED RINGERS IV SOLN
INTRAVENOUS | Status: DC
  Administered 2012-05-29: 08:00:00 via INTRAVENOUS
  Administered 2012-05-29: 100 mL/h via INTRAVENOUS
  Filled 2012-05-29: qty 1000

## 2012-05-29 MED ORDER — STERILE WATER FOR IRRIGATION IR SOLN
Status: DC | PRN
  Administered 2012-05-29: 3000 mL

## 2012-05-29 MED ORDER — FENTANYL CITRATE 0.05 MG/ML IJ SOLN
INTRAMUSCULAR | Status: DC | PRN
  Administered 2012-05-29 (×4): 25 ug via INTRAVENOUS

## 2012-05-29 MED ORDER — PROMETHAZINE HCL 25 MG/ML IJ SOLN
6.2500 mg | INTRAMUSCULAR | Status: DC | PRN
  Filled 2012-05-29: qty 1

## 2012-05-29 MED ORDER — HYDROCODONE-ACETAMINOPHEN 5-500 MG PO TABS: 1.0000 | ORAL_TABLET | Freq: Four times a day (QID) | ORAL | Status: DC | PRN

## 2012-05-29 MED ORDER — FENTANYL CITRATE 0.05 MG/ML IJ SOLN
25.0000 ug | INTRAMUSCULAR | Status: DC | PRN
  Filled 2012-05-29: qty 1

## 2012-05-29 MED ORDER — LACTATED RINGERS IV SOLN
INTRAVENOUS | Status: DC | PRN
  Administered 2012-05-29: 08:00:00 via INTRAVENOUS

## 2012-05-29 MED ORDER — CIPROFLOXACIN HCL 250 MG PO TABS: 250.0000 mg | ORAL_TABLET | Freq: Two times a day (BID) | ORAL | Status: DC

## 2012-05-29 MED ORDER — ONDANSETRON HCL 4 MG/2ML IJ SOLN
INTRAMUSCULAR | Status: DC | PRN
  Administered 2012-05-29: 4 mg via INTRAVENOUS

## 2012-05-29 MED ORDER — DEXAMETHASONE SODIUM PHOSPHATE 4 MG/ML IJ SOLN
INTRAMUSCULAR | Status: DC | PRN
  Administered 2012-05-29: 8 mg via INTRAVENOUS

## 2012-05-29 MED ORDER — KETOROLAC TROMETHAMINE 30 MG/ML IJ SOLN
INTRAMUSCULAR | Status: DC | PRN
  Administered 2012-05-29: 30 mg via INTRAVENOUS

## 2012-05-29 MED ORDER — PHENAZOPYRIDINE HCL 200 MG PO TABS
ORAL | Status: DC | PRN
  Administered 2012-05-29: 09:00:00 via INTRAVESICAL

## 2012-05-29 SURGICAL SUPPLY — 23 items
BAG DRAIN URO-CYSTO SKYTR STRL (DRAIN) ×2 IMPLANT
CANISTER SUCT LVC 12 LTR MEDI- (MISCELLANEOUS) ×2 IMPLANT
CATH FOLEY 2WAY SLVR  5CC 18FR (CATHETERS)
CATH FOLEY 2WAY SLVR 5CC 18FR (CATHETERS) IMPLANT
CATH ROBINSON RED A/P 12FR (CATHETERS) IMPLANT
CATH ROBINSON RED A/P 14FR (CATHETERS) ×4 IMPLANT
CLOTH BEACON ORANGE TIMEOUT ST (SAFETY) ×2 IMPLANT
DRAPE CAMERA CLOSED 9X96 (DRAPES) ×2 IMPLANT
ELECT REM PT RETURN 9FT ADLT (ELECTROSURGICAL)
ELECTRODE REM PT RTRN 9FT ADLT (ELECTROSURGICAL) IMPLANT
GLOVE BIO SURGEON STRL SZ7.5 (GLOVE) ×2 IMPLANT
GLOVE BIOGEL PI IND STRL 7.0 (GLOVE) ×1 IMPLANT
GLOVE BIOGEL PI INDICATOR 7.0 (GLOVE) ×1
GLOVE ECLIPSE 7.0 STRL STRAW (GLOVE) ×2 IMPLANT
GOWN PREVENTION PLUS XLARGE (GOWN DISPOSABLE) ×2 IMPLANT
GOWN STRL NON-REIN LRG LVL3 (GOWN DISPOSABLE) ×2 IMPLANT
GOWN STRL REIN XL XLG (GOWN DISPOSABLE) ×2 IMPLANT
NDL SAFETY ECLIPSE 18X1.5 (NEEDLE) ×1 IMPLANT
NEEDLE HYPO 18GX1.5 SHARP (NEEDLE) ×1
PACK CYSTOSCOPY (CUSTOM PROCEDURE TRAY) ×2 IMPLANT
SUT SILK 0 TIES 10X30 (SUTURE) IMPLANT
SYR 20CC LL (SYRINGE) ×2 IMPLANT
WATER STERILE IRR 3000ML UROMA (IV SOLUTION) ×2 IMPLANT

## 2012-05-29 NOTE — Interval H&P Note (Signed)
History and Physical Interval Note:  05/29/2012 7:15 AM  [name]  has presented today for surgery, with the diagnosis of PELVIC PAIN   The various methods of treatment have been discussed with the patient and family. After consideration of risks, benefits and other options for treatment, the patient has consented to  Procedure(s) (LRB) with comments: CYSTOSCOPY/HYDRODISTENSION (N/A) - INSTILLATION OF MARCAINE AND PYRIDIUM  as a surgical intervention .  The patient's history has been reviewed, patient examined, no change in status, stable for surgery.  I have reviewed the patient's chart and labs.  Questions were answered to the patient's satisfaction.     [name] A

## 2012-05-29 NOTE — Op Note (Signed)
Preoperative diagnosis: Pelvic pain Postoperative diagnosis: Pelvic pain Surgery cystoscopy bladder hydrodistention bladder instillation therapy Surgeon: Dr. Lorin Picket [name]  The patient has the above diagnoses and consented the above procedure. Extra care was taken leg positioning. Preoperative antibodies were given  21 [place] scope was utilized. Bladder mucosa and trigone were normal. Is no stitch or foreign body or carcinoma. She had a moderate cystocele.  Bladder was emptied and I re\re cystoscoped Dr. Loyal Jacobson same to 500 cc. There were no glomerulations or bladder injury or findings in keeping with interstitial cystitis  Bladder was emptied.  A separate procedure with a [place] catheter instilled 15 cc of 0.5% Marcaine was 400 mg of peridium  The patient followed as per her

## 2012-05-29 NOTE — Anesthesia Postprocedure Evaluation (Signed)
  Anesthesia Post-op Note  Patient: [name]  Procedure(s) Performed: Procedure(s) (LRB): CYSTOSCOPY/HYDRODISTENSION (N/A)  Patient Location: PACU  Anesthesia Type: General  Level of Consciousness: awake and alert   Airway and Oxygen Therapy: Patient Spontanous Breathing  Post-op Pain: mild  Post-op Assessment: Post-op Vital signs reviewed, Patient's Cardiovascular Status Stable, Respiratory Function Stable, Patent Airway and No signs of Nausea or vomiting  Last Vitals:  Filed Vitals:   05/29/12 0859  BP: 136/66  Pulse:   Temp: 36.4 C  Resp: 10    Post-op Vital Signs: stable   Complications: No apparent anesthesia complications

## 2012-05-29 NOTE — Anesthesia Procedure Notes (Signed)
Procedure Name: LMA Insertion Date/Time: 05/29/2012 8:31 AM Performed by: Jessica Priest Pre-anesthesia Checklist: Patient identified, Emergency Drugs available, Suction available and Patient being monitored Patient Re-evaluated:Patient Re-evaluated prior to inductionOxygen Delivery Method: Circle System Utilized Preoxygenation: Pre-oxygenation with 100% oxygen Intubation Type: IV induction Ventilation: Mask ventilation without difficulty LMA: LMA inserted LMA Size: 3.0 Number of attempts: 1 Airway Equipment and Method: bite block Placement Confirmation: positive ETCO2 Tube secured with: Tape Dental Injury: Teeth and Oropharynx as per pre-operative assessment

## 2012-05-29 NOTE — Anesthesia Preprocedure Evaluation (Signed)
Anesthesia Evaluation  Patient identified by MRN, date of birth, ID band Patient awake    Reviewed: Allergy & Precautions, H&P , NPO status , Patient's Chart, lab work & pertinent test results  Airway Mallampati: II TM Distance: >3 FB Neck ROM: Full    Dental No notable dental hx.    Pulmonary Current Smoker,  breath sounds clear to auscultation  Pulmonary exam normal       Cardiovascular hypertension, Rhythm:Regular Rate:Normal  LBBB   Neuro/Psych negative neurological ROS  negative psych ROS   GI/Hepatic GERD-  Medicated,(+) Hepatitis -, B  Endo/Other  negative endocrine ROS  Renal/GU negative Renal ROS  negative genitourinary   Musculoskeletal negative musculoskeletal ROS (+)   Abdominal   Peds negative pediatric ROS (+)  Hematology negative hematology ROS (+)   Anesthesia Other Findings   Reproductive/Obstetrics negative OB ROS                           Anesthesia Physical Anesthesia Plan  ASA: III  Anesthesia Plan: General   Post-op Pain Management:    Induction: Intravenous  Airway Management Planned: LMA  Additional Equipment:   Intra-op Plan:   Post-operative Plan:   Informed Consent: I have reviewed the patients History and Physical, chart, labs and discussed the procedure including the risks, benefits and alternatives for the proposed anesthesia with the patient or authorized representative who has indicated his/her understanding and acceptance.   Dental advisory given  Plan Discussed with: CRNA and Surgeon  Anesthesia Plan Comments:         Anesthesia Quick Evaluation

## 2012-05-29 NOTE — Transfer of Care (Signed)
Immediate Anesthesia Transfer of Care Note  Patient: [name]  Procedure(s) Performed: Procedure(s) (LRB): CYSTOSCOPY/HYDRODISTENSION (N/A)  Patient Location: PACU  Anesthesia Type: General  Level of Consciousness: awake, sedated, patient cooperative and responds to stimulation  Airway & Oxygen Therapy: Patient Spontanous Breathing and Patient connected to face mask oxygen  Post-op Assessment: Report given to PACU RN, Post -op Vital signs reviewed and stable and Patient moving all extremities  Post vital signs: Reviewed and stable  Complications: No apparent anesthesia complications

## 2012-05-30 ENCOUNTER — Encounter (HOSPITAL_BASED_OUTPATIENT_CLINIC_OR_DEPARTMENT_OTHER): Payer: Self-pay | Admitting: Urology

## 2012-06-04 ENCOUNTER — Encounter (HOSPITAL_COMMUNITY): Payer: Self-pay | Admitting: Pharmacy Technician

## 2012-06-04 ENCOUNTER — Other Ambulatory Visit: Payer: Self-pay | Admitting: Neurosurgery

## 2012-06-05 ENCOUNTER — Encounter (HOSPITAL_COMMUNITY): Payer: Self-pay | Admitting: *Deleted

## 2012-06-05 MED ORDER — VANCOMYCIN HCL IN DEXTROSE 1-5 GM/200ML-% IV SOLN: 1000.0000 mg | INTRAVENOUS | Status: DC

## 2012-06-05 NOTE — H&P (Signed)
Ryleeann Urquiza  #[id number] DOB:  Mar 07, 1935   06/04/2012:     Ms. Karapetian comes back to review her MRI of the lumbar spine and this shows that she has a solid arthrodesis of the L4-5 level, some degenerative disc disease at L5-S1, mild stenosis at L3-4 which does not appear to be advanced at all, and she has a left-sided synovial cyst at L2-3 in the foraminal/extraforaminal position which is causing considerable compression of the left L2 nerve root.    On examination, this entirely fits with her symptoms.  She has weakness in her left hip flexors compared to the right.  She says that she is quite miserable and not getting any improvement or relief.    I have recommended that we go ahead with a left laminectomy at L2-3 with resection of the synovial cyst.  She wishes to go ahead with this and will plan on doing so on 06/06/2012.  She is quite miserable and in lots of pain and using a cane to get around.    Risks and benefits were discussed with the patient and she wishes to proceed.  She has been taking Hydrocodone 10/325 up to 2 at this time and says it is not relieving her pain.  I am going to give her a prescription for Percocet 10 mg. and see if that works better for her.           Danae Orleans. Venetia Maxon, M.D./gde   Karizma Cheek  #[id number] DOB:  1935-04-07   05/19/2012:  Baxter Hire comes in today with a chief complaint of low back and left hip and leg pain, with pins and needles with standing.  She has had a Cortisone shot and two pain shots by her primary doctor, neither of which have given her a great deal of relief. She is taking Hydrocodone one to two up to two to three times daily.    She has had lumbar radiographs which show surgical changes at L4-5.  No evidence of hardware complication. There does appear to be a solid arthrodesis at the previously operated levels.  The previous radiograph did not show significant abnormalities.  She has the appearance of degenerative changes at L3-4 with  mild listhesis at this level and has some coronal malalignment at this level toward the left.   I have recommended we get an MRI of her lumbar spine to evaluate this further and I will make further recommendations after that has been done.          Danae Orleans. Venetia Maxon, M.D./aft    [name]  #[id number]  DOB:  10-01-1934    08/10/2009:  Michole Lecuyer returns today.  She is doing extremely well with the 15-month visit following lumbar fusion at L4-5 and says she is fully better without any pain without any complaints.    Radiographs today show well position of interbody grafts and pedicle screw fixation with solid arthrodesis.    At this point, I am delighted with her progress.  I am going to release her.  I am going to see her back on an as needed basis.          Danae Orleans. Venetia Maxon, M.D./sv     NEUROSURGICAL CONSULTATION   [name]           December 11, 1996  HISTORY:     The patient is a 78 year old right-handed female seen in neurosurgical consultation because of left buttock, hip and leg pain felt  to be secondary to lumbar disc disease; in addition, she is having cervical pain.  The patient states that around the 30th of March she developed a "crick" in her neck.  She subsequently was seen in an emergency room and received a shot for arthritis.  She did also have some chiropractic treatment and this seemed to help her neck.  She has noted that turning her head still hurts, especially on the left side of her neck.  She does not describe Lhermitte's phenomenon or radicular pain.  During the intervening time that she has been having some cervical problems, she has developed pain down her right leg associated with some tingling involving all of her toes.  She was taken out of work last Thursday by Dr. Leona Carry.  She has been on Relafen, Darvon and Vicodin.  The patient states that although she has been out of work, she has not been on bed rest at home.  She brought plain x-rays of the  cervical spine with her, which show marked degenerative changes at C4-5 and C5-6.  A MRI scan of the cervical spine confirms the degenerative changes. She does not have any significant spinal cord compression. There is some calcification in the right carotid artery.  The patient does have some foraminal narrowing at both C4-5 and C5-6.  Plain x-rays of the lumbar spine and a MRI scan of the lumbar spine showed some antero-listhesis of L4 on L5 associated with degenerative disease at L4-5 and L5-S1.  There is some spinal stenosis at the L4-5 level and some subligamentous protrusion of the disc more eccentric to the right.  At the L5-S1 level she appears to have some protrusion of the disc eccentric to the left compressing the left L5 nerve root.  There also may be a far lateral protrusion at this level causing some L5 nerve root compression.  There are some facet arthritic changes.  The patient does not know of any specific precipitating circumstances.  She works as an Midwife at [employer].  She only has to lift 10 to 15 lbs, but she does do a substantial amount of twisting of her body with her legs in a fixed position.  PAST MEDICAL HISTORY:  The patient has had an appendectomy, tonsillectomy and cholecystectomy. She has also had a hysterectomy and a bladder tackup procedure.  She is ALLERGIC TO PENICILLIN AND CODEINE.  She does smoke small amounts of cigarettes and has done this since 1970.  She does not drink.  Her weight has been stable at 145 lbs. and she is 5'4" tall.  PERSONAL HISTORY:   She is divorced and has four children who are alive and well.  One son was killed in a motorcycle accident, two of her daughters accompanied her to the office; one of them has sustained a closed head injury in the past and was under the care of Dr. Hope Pigeon.    FAMILY HISTORY:    Her mother died at age 63 years of nonspecific causes.  Her father died at age 62 years with heart disease and diabetes.   REVIEW OF  SYSTEMS:   Negative except as noted above.  PHYSICAL EXAMINATION:  General: The patient is a well developed, well nourished female.  She looks much younger than her stated age.  She is not in severe pain this afternoon.  HEENT negative.  Neck - moderate limitation of extension.  Flexion and lateral rotation are done well.  No Lhermitte's phenomenon or radicular pain with neck movement.  Lungs clear.  Heart sinus rhythm, no murmurs.  Abdomen soft, nontender.  Extremities - see below; there are no ischemic changes and no evidence of thrombophlebitis.  NEUROLOGICAL EXAMINATION: The patient's mental status is normal.  PERRLA.  Extraocular movements are full.  The discs are flat.   There is no facial paresis.  She has a tendency to avoid full weight bearing on her left leg.  She can stand on the toes of either foot with the other leg off the ground, although this is done more readily on the right than the left.  To direct testing, left extensor hallucis longus and dorsiflexion are slightly diminished compared to the right.  She has had two bones removed from the fourth and fifth toes of her right foot and has had some numbness subsequent to that surgery.  She also has had surgery on both great toes.  She seems to have some scattered hypalgesia to pinprick in the left foot compared to the right.  I could not get a focal dermatomal distribution.  Joint position is intact.   Deep tendon reflexes:  On the right, brachioradialis 1-2+, biceps 1+, triceps 1-2+, knee jerk 2+, ankle jerk 0.  On the left, brachioradialis 1-2+, biceps 1-2+, triceps 1-2+, knee jerk 2+, ankle jerk 0.  Babinski responses are plantar bilaterally.  There is no ankle clonus.   Straight leg raising is done to 80 degrees on the right and 70 degrees on the left.  Hip rotation and femoral stretch maneuvers are negative bilaterally.  CLINICAL IMPRESSION:  1. Low back, left buttock, hip and leg pain, secondary to lumbar disc disease; spondylosis at L4-5  and L5-S1; herniated nucleus pulposus  at L5-S1 eccentric to the left.      2. Cervical spondylosis, C4-5 and C5-6; associated neck pain.      3. ALLERGIC TO PENICILLIN AND CODEINE.  RECOMMENDATIONS:   The clinical situation was discussed in detail with the patient and her two daughters.  She is to continue on limited activity at home, but is going to mainly be on bed rest with bathroom and meal privileges.  We are going to switch her from Relafen to a Medrol dosepak.  I plan to recheck her in about two weeks and on a prn basis.  AP and lateral lumbar spine x-rays with flexion and extension views are going to be made at [facility] when she leaves the office today.  Further recommendations will depend on her clinical course.  If any operative intervention were to be necessary in the low back, I think she would need pre-operative evaluation outpatient Omnipaque myelography and post-myelogram CT scanning to further define the problem.  I think she has at least a 50-50 chance of avoiding operative intervention.  [facility]    Alanson Aly. Roxan Hockey, M.D.

## 2012-06-06 ENCOUNTER — Encounter (HOSPITAL_COMMUNITY): Payer: Self-pay | Admitting: Anesthesiology

## 2012-06-06 ENCOUNTER — Encounter (HOSPITAL_COMMUNITY)
Admission: RE | Disposition: A | Discharge status: Home / Self Care | Payer: Self-pay | Source: Ambulatory Visit | Attending: Neurosurgery

## 2012-06-06 ENCOUNTER — Observation Stay (HOSPITAL_COMMUNITY)
Admission: RE | Admit: 2012-06-06 | Discharge: 2012-06-08 | Disposition: A | Discharge status: Home / Self Care | Payer: Medicare Other | Source: Ambulatory Visit | Attending: Neurosurgery | Admitting: Neurosurgery

## 2012-06-06 ENCOUNTER — Ambulatory Visit (HOSPITAL_COMMUNITY): Payer: Medicare Other

## 2012-06-06 ENCOUNTER — Ambulatory Visit (HOSPITAL_COMMUNITY): Payer: Medicare Other | Admitting: Anesthesiology

## 2012-06-06 DIAGNOSIS — F17200 Nicotine dependence, unspecified, uncomplicated: Secondary | ICD-10-CM | POA: Insufficient documentation

## 2012-06-06 DIAGNOSIS — M7130 Other bursal cyst, unspecified site: Secondary | ICD-10-CM | POA: Insufficient documentation

## 2012-06-06 DIAGNOSIS — K219 Gastro-esophageal reflux disease without esophagitis: Secondary | ICD-10-CM | POA: Insufficient documentation

## 2012-06-06 DIAGNOSIS — I10 Essential (primary) hypertension: Secondary | ICD-10-CM | POA: Insufficient documentation

## 2012-06-06 DIAGNOSIS — M47817 Spondylosis without myelopathy or radiculopathy, lumbosacral region: Principal | ICD-10-CM | POA: Insufficient documentation

## 2012-06-06 HISTORY — DX: Personal history of other medical treatment: Z92.89

## 2012-06-06 HISTORY — DX: Unspecified osteoarthritis, unspecified site: M19.90

## 2012-06-06 HISTORY — PX: LUMBAR LAMINECTOMY/DECOMPRESSION MICRODISCECTOMY: SHX5026

## 2012-06-06 LAB — CBC
Platelets: 333 10*3/uL (ref 150–400)
RDW: 14 % (ref 11.5–15.5)
WBC: 7.8 10*3/uL (ref 4.0–10.5)

## 2012-06-06 LAB — BASIC METABOLIC PANEL
Calcium: 9.4 mg/dL (ref 8.4–10.5)
Chloride: 105 mEq/L (ref 96–112)
Creatinine, Ser: 0.82 mg/dL (ref 0.50–1.10)
GFR calc Af Amer: 79 mL/min — ABNORMAL LOW (ref >90)

## 2012-06-06 LAB — SURGICAL PCR SCREEN
MRSA, PCR: NEGATIVE
Staphylococcus aureus: NEGATIVE

## 2012-06-06 SURGERY — LUMBAR LAMINECTOMY/DECOMPRESSION MICRODISCECTOMY 1 LEVEL
Anesthesia: General | Site: Spine Lumbar | Laterality: Left | Wound class: Clean

## 2012-06-06 MED ORDER — FUROSEMIDE 20 MG PO TABS
10.0000 mg | ORAL_TABLET | Freq: Every day | ORAL | Status: DC
  Administered 2012-06-06 – 2012-06-07 (×2): 10 mg via ORAL
  Administered 2012-06-08: 10:00:00 via ORAL
  Filled 2012-06-06 (×3): qty 0.5

## 2012-06-06 MED ORDER — ONDANSETRON HCL 4 MG/2ML IJ SOLN: 4.0000 mg | INTRAMUSCULAR | Status: DC | PRN

## 2012-06-06 MED ORDER — SENNA 8.6 MG PO TABS
1.0000 | ORAL_TABLET | Freq: Two times a day (BID) | ORAL | Status: DC
  Administered 2012-06-06 – 2012-06-08 (×4): 8.6 mg via ORAL
  Filled 2012-06-06 (×5): qty 1

## 2012-06-06 MED ORDER — BISACODYL 10 MG RE SUPP
10.0000 mg | Freq: Every day | RECTAL | Status: DC | PRN
  Administered 2012-06-08: 10 mg via RECTAL
  Filled 2012-06-06: qty 1

## 2012-06-06 MED ORDER — ASPIRIN EC 81 MG PO TBEC
81.0000 mg | DELAYED_RELEASE_TABLET | Freq: Every day | ORAL | Status: DC
  Administered 2012-06-06 – 2012-06-08 (×3): 81 mg via ORAL
  Filled 2012-06-06 (×3): qty 1

## 2012-06-06 MED ORDER — LACTATED RINGERS IV SOLN
INTRAVENOUS | Status: DC | PRN
  Administered 2012-06-06 (×2): via INTRAVENOUS

## 2012-06-06 MED ORDER — ZOLPIDEM TARTRATE 5 MG PO TABS: 5.0000 mg | ORAL_TABLET | Freq: Every evening | ORAL | Status: DC | PRN

## 2012-06-06 MED ORDER — BUPIVACAINE HCL (PF) 0.5 % IJ SOLN
INTRAMUSCULAR | Status: DC | PRN
  Administered 2012-06-06: 5 mL

## 2012-06-06 MED ORDER — PHENYLEPHRINE HCL 10 MG/ML IJ SOLN
INTRAMUSCULAR | Status: DC | PRN
  Administered 2012-06-06 (×3): 80 ug via INTRAVENOUS

## 2012-06-06 MED ORDER — PROPOFOL 10 MG/ML IV BOLUS
INTRAVENOUS | Status: DC | PRN
  Administered 2012-06-06: 150 mg via INTRAVENOUS

## 2012-06-06 MED ORDER — ALUM & MAG HYDROXIDE-SIMETH 200-200-20 MG/5ML PO SUSP: 30.0000 mL | Freq: Four times a day (QID) | ORAL | Status: DC | PRN

## 2012-06-06 MED ORDER — SENNOSIDES-DOCUSATE SODIUM 8.6-50 MG PO TABS: 1.0000 | ORAL_TABLET | Freq: Every evening | ORAL | Status: DC | PRN

## 2012-06-06 MED ORDER — OXYCODONE-ACETAMINOPHEN 5-325 MG PO TABS
1.0000 | ORAL_TABLET | ORAL | Status: DC | PRN
  Administered 2012-06-06: 2 via ORAL
  Filled 2012-06-06: qty 2

## 2012-06-06 MED ORDER — MUPIROCIN 2 % EX OINT
TOPICAL_OINTMENT | CUTANEOUS | Status: AC
  Filled 2012-06-06: qty 22

## 2012-06-06 MED ORDER — METHOCARBAMOL 500 MG PO TABS
500.0000 mg | ORAL_TABLET | Freq: Four times a day (QID) | ORAL | Status: DC | PRN
  Filled 2012-06-06: qty 1

## 2012-06-06 MED ORDER — AMITRIPTYLINE HCL 25 MG PO TABS
25.0000 mg | ORAL_TABLET | Freq: Every day | ORAL | Status: DC
  Administered 2012-06-06 – 2012-06-07 (×2): 25 mg via ORAL
  Filled 2012-06-06 (×3): qty 1

## 2012-06-06 MED ORDER — MORPHINE SULFATE 2 MG/ML IJ SOLN
1.0000 mg | INTRAMUSCULAR | Status: DC | PRN
Start: 2012-06-06 — End: 2012-06-08
  Administered 2012-06-06 – 2012-06-07 (×2): 4 mg via INTRAVENOUS
  Administered 2012-06-07: 2 mg via INTRAVENOUS
  Administered 2012-06-07 (×2): 4 mg via INTRAVENOUS
  Filled 2012-06-06 (×2): qty 2
  Filled 2012-06-06: qty 1
  Filled 2012-06-06: qty 2
  Filled 2012-06-06 (×2): qty 1

## 2012-06-06 MED ORDER — DEXAMETHASONE SODIUM PHOSPHATE 10 MG/ML IJ SOLN
INTRAMUSCULAR | Status: AC
  Administered 2012-06-06: 10 mg via INTRAVENOUS
  Filled 2012-06-06: qty 1

## 2012-06-06 MED ORDER — NEOSTIGMINE METHYLSULFATE 1 MG/ML IJ SOLN
INTRAMUSCULAR | Status: DC | PRN
  Administered 2012-06-06: 3 mg via INTRAVENOUS

## 2012-06-06 MED ORDER — METHOCARBAMOL 100 MG/ML IJ SOLN
500.0000 mg | Freq: Four times a day (QID) | INTRAVENOUS | Status: DC | PRN
  Filled 2012-06-06: qty 5

## 2012-06-06 MED ORDER — BUDESONIDE-FORMOTEROL FUMARATE 160-4.5 MCG/ACT IN AERO
1.0000 | INHALATION_SPRAY | Freq: Two times a day (BID) | RESPIRATORY_TRACT | Status: DC
  Administered 2012-06-06 – 2012-06-08 (×4): 1 via RESPIRATORY_TRACT
  Filled 2012-06-06: qty 6

## 2012-06-06 MED ORDER — DOCUSATE SODIUM 100 MG PO CAPS
100.0000 mg | ORAL_CAPSULE | Freq: Two times a day (BID) | ORAL | Status: DC
  Administered 2012-06-06 – 2012-06-08 (×4): 100 mg via ORAL
  Filled 2012-06-06 (×3): qty 1

## 2012-06-06 MED ORDER — VANCOMYCIN HCL IN DEXTROSE 1-5 GM/200ML-% IV SOLN
INTRAVENOUS | Status: AC
  Administered 2012-06-06: 1000 mg via INTRAVENOUS
  Filled 2012-06-06: qty 200

## 2012-06-06 MED ORDER — FENTANYL CITRATE 0.05 MG/ML IJ SOLN
INTRAMUSCULAR | Status: AC
  Filled 2012-06-06: qty 2

## 2012-06-06 MED ORDER — SIMVASTATIN 10 MG PO TABS
10.0000 mg | ORAL_TABLET | Freq: Every day | ORAL | Status: DC
  Administered 2012-06-06 – 2012-06-07 (×2): 10 mg via ORAL
  Filled 2012-06-06 (×3): qty 1

## 2012-06-06 MED ORDER — HYDROCODONE-ACETAMINOPHEN 5-325 MG PO TABS: 1.0000 | ORAL_TABLET | ORAL | Status: DC | PRN

## 2012-06-06 MED ORDER — ACETAMINOPHEN 10 MG/ML IV SOLN
INTRAVENOUS | Status: AC
  Administered 2012-06-06: 1000 mg via INTRAVENOUS
  Filled 2012-06-06: qty 100

## 2012-06-06 MED ORDER — ROCURONIUM BROMIDE 100 MG/10ML IV SOLN
INTRAVENOUS | Status: DC | PRN
  Administered 2012-06-06: 50 mg via INTRAVENOUS

## 2012-06-06 MED ORDER — FLEET ENEMA 7-19 GM/118ML RE ENEM: 1.0000 | ENEMA | Freq: Once | RECTAL | Status: AC | PRN

## 2012-06-06 MED ORDER — FENTANYL CITRATE 0.05 MG/ML IJ SOLN
INTRAMUSCULAR | Status: DC | PRN
  Administered 2012-06-06 (×4): 50 ug via INTRAVENOUS

## 2012-06-06 MED ORDER — HYDROCODONE-ACETAMINOPHEN 5-325 MG PO TABS
1.0000 | ORAL_TABLET | ORAL | Status: DC | PRN
  Administered 2012-06-08 (×2): 1 via ORAL
  Administered 2012-06-08: 2 via ORAL
  Filled 2012-06-06 (×2): qty 1
  Filled 2012-06-06: qty 2

## 2012-06-06 MED ORDER — SODIUM CHLORIDE 0.9 % IJ SOLN: 3.0000 mL | INTRAMUSCULAR | Status: DC | PRN

## 2012-06-06 MED ORDER — SODIUM CHLORIDE 0.9 % IV SOLN: 250.0000 mL | INTRAVENOUS | Status: DC

## 2012-06-06 MED ORDER — VITAMIN D3 25 MCG (1000 UNIT) PO TABS
1000.0000 [IU] | ORAL_TABLET | Freq: Every day | ORAL | Status: DC
  Administered 2012-06-06 – 2012-06-08 (×3): 1000 [IU] via ORAL
  Filled 2012-06-06 (×3): qty 1

## 2012-06-06 MED ORDER — GLYCOPYRROLATE 0.2 MG/ML IJ SOLN
INTRAMUSCULAR | Status: DC | PRN
  Administered 2012-06-06: 0.4 mg via INTRAVENOUS

## 2012-06-06 MED ORDER — PHENOL 1.4 % MT LIQD: 1.0000 | OROMUCOSAL | Status: DC | PRN

## 2012-06-06 MED ORDER — MIDAZOLAM HCL 5 MG/5ML IJ SOLN
INTRAMUSCULAR | Status: DC | PRN
  Administered 2012-06-06: 0.5 mg via INTRAVENOUS

## 2012-06-06 MED ORDER — OXYCODONE-ACETAMINOPHEN 10-325 MG PO TABS
1.0000 | ORAL_TABLET | ORAL | Status: DC | PRN
  Filled 2012-06-06: qty 1

## 2012-06-06 MED ORDER — MUPIROCIN 2 % EX OINT
TOPICAL_OINTMENT | Freq: Two times a day (BID) | CUTANEOUS | Status: DC
  Filled 2012-06-06: qty 22

## 2012-06-06 MED ORDER — THROMBIN 5000 UNITS EX KIT
PACK | CUTANEOUS | Status: DC | PRN
  Administered 2012-06-06 (×2): 5000 [IU] via TOPICAL

## 2012-06-06 MED ORDER — PANTOPRAZOLE SODIUM 40 MG PO TBEC
40.0000 mg | DELAYED_RELEASE_TABLET | Freq: Every day | ORAL | Status: DC
  Administered 2012-06-07 – 2012-06-08 (×2): 40 mg via ORAL
  Filled 2012-06-06 (×3): qty 1

## 2012-06-06 MED ORDER — FLUTICASONE PROPIONATE 50 MCG/ACT NA SUSP
2.0000 | Freq: Every day | NASAL | Status: DC
  Administered 2012-06-06 – 2012-06-08 (×2): 2 via NASAL
  Filled 2012-06-06: qty 16

## 2012-06-06 MED ORDER — ACETAMINOPHEN 650 MG RE SUPP: 650.0000 mg | RECTAL | Status: DC | PRN

## 2012-06-06 MED ORDER — ONDANSETRON HCL 4 MG/2ML IJ SOLN: 4.0000 mg | Freq: Four times a day (QID) | INTRAMUSCULAR | Status: DC | PRN

## 2012-06-06 MED ORDER — ADULT MULTIVITAMIN W/MINERALS CH
1.0000 | ORAL_TABLET | Freq: Every day | ORAL | Status: DC
  Administered 2012-06-06 – 2012-06-08 (×3): 1 via ORAL
  Filled 2012-06-06 (×3): qty 1

## 2012-06-06 MED ORDER — SODIUM CHLORIDE 0.9 % IJ SOLN
3.0000 mL | Freq: Two times a day (BID) | INTRAMUSCULAR | Status: DC
  Administered 2012-06-06 – 2012-06-07 (×3): 3 mL via INTRAVENOUS

## 2012-06-06 MED ORDER — ONDANSETRON HCL 4 MG/2ML IJ SOLN
INTRAMUSCULAR | Status: DC | PRN
  Administered 2012-06-06: 4 mg via INTRAVENOUS

## 2012-06-06 MED ORDER — LORATADINE 10 MG PO TABS
10.0000 mg | ORAL_TABLET | Freq: Every day | ORAL | Status: DC
  Administered 2012-06-06 – 2012-06-08 (×3): 10 mg via ORAL
  Filled 2012-06-06 (×3): qty 1

## 2012-06-06 MED ORDER — METHYLPREDNISOLONE ACETATE 80 MG/ML IJ SUSP
INTRAMUSCULAR | Status: DC | PRN
  Administered 2012-06-06: 80 mg

## 2012-06-06 MED ORDER — FENTANYL CITRATE 0.05 MG/ML IJ SOLN
25.0000 ug | INTRAMUSCULAR | Status: DC | PRN
  Administered 2012-06-06 (×2): 50 ug via INTRAVENOUS

## 2012-06-06 MED ORDER — ACETAMINOPHEN 325 MG PO TABS: 650.0000 mg | ORAL_TABLET | ORAL | Status: DC | PRN

## 2012-06-06 MED ORDER — MENTHOL 3 MG MT LOZG: 1.0000 | LOZENGE | OROMUCOSAL | Status: DC | PRN

## 2012-06-06 MED ORDER — HEMOSTATIC AGENTS (NO CHARGE) OPTIME
TOPICAL | Status: DC | PRN
  Administered 2012-06-06: 1 via TOPICAL

## 2012-06-06 MED ORDER — LOSARTAN POTASSIUM 50 MG PO TABS
50.0000 mg | ORAL_TABLET | Freq: Every day | ORAL | Status: DC
  Administered 2012-06-07 – 2012-06-08 (×2): 50 mg via ORAL
  Filled 2012-06-06 (×2): qty 1

## 2012-06-06 MED ORDER — KCL IN DEXTROSE-NACL 20-5-0.45 MEQ/L-%-% IV SOLN
INTRAVENOUS | Status: DC
  Administered 2012-06-06: 19:00:00 via INTRAVENOUS
  Filled 2012-06-06 (×4): qty 1000

## 2012-06-06 MED ORDER — 0.9 % SODIUM CHLORIDE (POUR BTL) OPTIME
TOPICAL | Status: DC | PRN
  Administered 2012-06-06: 1000 mL

## 2012-06-06 MED ORDER — FENTANYL CITRATE 0.05 MG/ML IJ SOLN
INTRAMUSCULAR | Status: DC | PRN
  Administered 2012-06-06: 100 ug via INTRAVENOUS

## 2012-06-06 MED ORDER — LIDOCAINE HCL (CARDIAC) 20 MG/ML IV SOLN
INTRAVENOUS | Status: DC | PRN
  Administered 2012-06-06: 60 mg via INTRAVENOUS

## 2012-06-06 MED ORDER — LIDOCAINE-EPINEPHRINE 1 %-1:100000 IJ SOLN
INTRAMUSCULAR | Status: DC | PRN
  Administered 2012-06-06: 5 mL

## 2012-06-06 MED ORDER — FAMOTIDINE 10 MG PO TABS: 10.0000 mg | ORAL_TABLET | Freq: Every day | ORAL | Status: DC

## 2012-06-06 SURGICAL SUPPLY — 57 items
BAG DECANTER FOR FLEXI CONT (MISCELLANEOUS) IMPLANT
BENZOIN TINCTURE PRP APPL 2/3 (GAUZE/BANDAGES/DRESSINGS) IMPLANT
BIT DRILL NEURO 2X3.1 SFT TUCH (MISCELLANEOUS) ×1 IMPLANT
BLADE SURG ROTATE 9660 (MISCELLANEOUS) IMPLANT
BUR ROUND FLUTED 5 RND (BURR) ×2 IMPLANT
CANISTER SUCTION 2500CC (MISCELLANEOUS) ×2 IMPLANT
CLOTH BEACON ORANGE TIMEOUT ST (SAFETY) ×2 IMPLANT
CONT SPEC 4OZ CLIKSEAL STRL BL (MISCELLANEOUS) ×2 IMPLANT
DERMABOND ADVANCED (GAUZE/BANDAGES/DRESSINGS)
DERMABOND ADVANCED .7 DNX12 (GAUZE/BANDAGES/DRESSINGS) IMPLANT
DRAPE LAPAROTOMY 100X72X124 (DRAPES) ×2 IMPLANT
DRAPE MICROSCOPE LEICA (MISCELLANEOUS) ×2 IMPLANT
DRAPE POUCH INSTRU U-SHP 10X18 (DRAPES) ×2 IMPLANT
DRAPE SURG 17X23 STRL (DRAPES) ×2 IMPLANT
DRESSING TELFA 8X3 (GAUZE/BANDAGES/DRESSINGS) IMPLANT
DRILL NEURO 2X3.1 SOFT TOUCH (MISCELLANEOUS) ×2
DURAPREP 26ML APPLICATOR (WOUND CARE) ×2 IMPLANT
ELECT REM PT RETURN 9FT ADLT (ELECTROSURGICAL) ×2
ELECTRODE REM PT RTRN 9FT ADLT (ELECTROSURGICAL) ×1 IMPLANT
GAUZE SPONGE 4X4 16PLY XRAY LF (GAUZE/BANDAGES/DRESSINGS) IMPLANT
GLOVE BIO SURGEON STRL SZ8 (GLOVE) ×2 IMPLANT
GLOVE BIOGEL PI IND STRL 7.0 (GLOVE) ×2 IMPLANT
GLOVE BIOGEL PI IND STRL 8 (GLOVE) ×2 IMPLANT
GLOVE BIOGEL PI IND STRL 8.5 (GLOVE) ×1 IMPLANT
GLOVE BIOGEL PI INDICATOR 7.0 (GLOVE) ×2
GLOVE BIOGEL PI INDICATOR 8 (GLOVE) ×2
GLOVE BIOGEL PI INDICATOR 8.5 (GLOVE) ×1
GLOVE ECLIPSE 7.5 STRL STRAW (GLOVE) ×2 IMPLANT
GLOVE ECLIPSE 8.0 STRL XLNG CF (GLOVE) ×2 IMPLANT
GLOVE EXAM NITRILE LRG STRL (GLOVE) IMPLANT
GLOVE EXAM NITRILE MD LF STRL (GLOVE) IMPLANT
GLOVE EXAM NITRILE XL STR (GLOVE) IMPLANT
GLOVE EXAM NITRILE XS STR PU (GLOVE) IMPLANT
GLOVE SURG SS PI 7.0 STRL IVOR (GLOVE) ×4 IMPLANT
GOWN BRE IMP SLV AUR LG STRL (GOWN DISPOSABLE) IMPLANT
GOWN BRE IMP SLV AUR XL STRL (GOWN DISPOSABLE) ×6 IMPLANT
GOWN STRL REIN 2XL LVL4 (GOWN DISPOSABLE) ×2 IMPLANT
KIT BASIN OR (CUSTOM PROCEDURE TRAY) ×2 IMPLANT
KIT ROOM TURNOVER OR (KITS) ×2 IMPLANT
NEEDLE HYPO 18GX1.5 BLUNT FILL (NEEDLE) IMPLANT
NEEDLE HYPO 25X1 1.5 SAFETY (NEEDLE) ×2 IMPLANT
NS IRRIG 1000ML POUR BTL (IV SOLUTION) ×2 IMPLANT
PACK LAMINECTOMY NEURO (CUSTOM PROCEDURE TRAY) ×2 IMPLANT
PAD ARMBOARD 7.5X6 YLW CONV (MISCELLANEOUS) ×10 IMPLANT
RUBBERBAND STERILE (MISCELLANEOUS) ×4 IMPLANT
SPONGE GAUZE 4X4 12PLY (GAUZE/BANDAGES/DRESSINGS) IMPLANT
SPONGE SURGIFOAM ABS GEL SZ50 (HEMOSTASIS) ×2 IMPLANT
STRIP CLOSURE SKIN 1/2X4 (GAUZE/BANDAGES/DRESSINGS) IMPLANT
SUT VIC AB 0 CT1 18XCR BRD8 (SUTURE) ×1 IMPLANT
SUT VIC AB 0 CT1 8-18 (SUTURE) ×1
SUT VIC AB 2-0 CT1 18 (SUTURE) ×2 IMPLANT
SUT VIC AB 3-0 SH 8-18 (SUTURE) ×2 IMPLANT
SYR 20ML ECCENTRIC (SYRINGE) ×2 IMPLANT
SYR 5ML LL (SYRINGE) ×2 IMPLANT
TOWEL OR 17X24 6PK STRL BLUE (TOWEL DISPOSABLE) ×2 IMPLANT
TOWEL OR 17X26 10 PK STRL BLUE (TOWEL DISPOSABLE) ×2 IMPLANT
WATER STERILE IRR 1000ML POUR (IV SOLUTION) ×2 IMPLANT

## 2012-06-06 NOTE — Brief Op Note (Signed)
06/06/2012  4:29 PM  PATIENT:  [name]  78 y.o. female  PRE-OPERATIVE DIAGNOSIS:  Synovial cyst, Lumbar spondylosis, Lumbar radiculopathy  POST-OPERATIVE DIAGNOSIS:  Synovial cyst, lumbar spondylosis,Lumbar radiculopathy  PROCEDURE:  Procedure(s) (LRB) with comments: LUMBAR LAMINECTOMY/DECOMPRESSION MICRODISCECTOMY 1 LEVEL (Left) - Left Lumbar two-three Laminectomy for resection of synovial cyst  SURGEON:  Surgeon(s) and Role:    * Maeola Harman, MD - Primary    * Hewitt Shorts, MD - Assisting  PHYSICIAN ASSISTANT:   ASSISTANTS: Poteat, RN   ANESTHESIA:   general  EBL:  Total I/O In: 1000 [I.V.:1000] Out: -   BLOOD ADMINISTERED:none  DRAINS: none   LOCAL MEDICATIONS USED:  LIDOCAINE   SPECIMEN:  No Specimen  DISPOSITION OF SPECIMEN:  N/A  COUNTS:  YES  TOURNIQUET:  * No tourniquets in log *  DICTATION: Patient has a synovial cyst at L 23 on the left withradiculopathy. It was elected to take her to surgery for left L23 resection of synovial cyst.  Procedure: Patient was brought to the operating room and following the smooth and uncomplicated induction of general endotracheal anesthesia she was placed in a prone position on the Wilson frame. Low back was prepped and draped in the usual sterile fashion with Betadine scrub and DuraPrep. Area of planned incision was infiltrated with local lidocaine. Incision was made in the midline and carried to the lumbodorsal fascia which was incised on the left side of midline. Subperiosteal dissection was performed exposing what was felt to be L23 level. Intraoperative x-ray demonstrated marker probes at L12 and L23.  A hemi-semi-laminectomy of L 2 was performed a high-speed drill and completed with Kerrison rongeurs and a generous foraminotomy was performed overlying the superior aspect of the L3 lamina. Ligamentum flavum was detached and removed in a piecemeal fashion and the L3 nerve root was decompressed laterally with removal  of the superior aspect of the facet and ligamentum causing nerve root compression. The microscope was brought into the field and the L3 nerve root was mobilized medially and carefully dissected from the synovial cyst which was adherent to the dura.  Using microdissection, the cyst was removed and neural elements thoroughly decompressed. There was no violation of the dura.  Hemostasis was assured with bipolar electrocautery and the interspace was irrigated with Depo-Medrol and fentanyl. The lumbodorsal fascia was closed with 0 Vicryl sutures the subcutaneous tissues reapproximated 2-0 Vicryl inverted sutures and the skin edges were reapproximated with 3-0 Vicryl subcuticular stitch. The wound is dressed with Dermabond. Patient was extubated in the operating room and taken to recovery in stable and satisfactory condition having tolerated his operation well counts were correct at the end of the case.  PLAN OF CARE: Admit for overnight observation  PATIENT DISPOSITION:  PACU - hemodynamically stable.   Delay start of Pharmacological VTE agent (>24hrs) due to surgical blood loss or risk of bleeding: yes

## 2012-06-06 NOTE — Interval H&P Note (Signed)
History and Physical Interval Note:  06/06/2012 6:55 AM  [name]  has presented today for surgery, with the diagnosis of Synovial cyst, Lumbar spondylosis, Lumbar radiculopathy  The various methods of treatment have been discussed with the patient and family. After consideration of risks, benefits and other options for treatment, the patient has consented to  Procedure(s) (LRB) with comments: LUMBAR LAMINECTOMY/DECOMPRESSION MICRODISCECTOMY 1 LEVEL (Left) - Left L2-3 Laminectomy for resection of synovial cyst as a surgical intervention .  The patient's history has been reviewed, patient examined, no change in status, stable for surgery.  I have reviewed the patient's chart and labs.  Questions were answered to the patient's satisfaction.     [name]  Date of Initial H&P: 06/05/2012  History reviewed, patient examined, no change in status, stable for surgery.

## 2012-06-06 NOTE — Transfer of Care (Signed)
Immediate Anesthesia Transfer of Care Note  Patient: [name]  Procedure(s) Performed: Procedure(s) (LRB) with comments: LUMBAR LAMINECTOMY/DECOMPRESSION MICRODISCECTOMY 1 LEVEL (Left) - Left Lumbar two-three Laminectomy for resection of synovial cyst  Patient Location: PACU  Anesthesia Type:General  Level of Consciousness: awake  Airway & Oxygen Therapy: Patient Spontanous Breathing and Patient connected to nasal cannula oxygen  Post-op Assessment: Report given to PACU RN and Post -op Vital signs reviewed and stable  Post vital signs: Reviewed and stable  Complications: No apparent anesthesia complications

## 2012-06-06 NOTE — Plan of Care (Signed)
Problem: Consults Goal: Diagnosis - Spinal Surgery Lumbar Laminectomy (Complex)     

## 2012-06-06 NOTE — Op Note (Signed)
06/06/2012  4:29 PM  PATIENT:  [name]  77 y.o. female  PRE-OPERATIVE DIAGNOSIS:  Synovial cyst, Lumbar spondylosis, Lumbar radiculopathy  POST-OPERATIVE DIAGNOSIS:  Synovial cyst, lumbar spondylosis,Lumbar radiculopathy  PROCEDURE:  Procedure(s) (LRB) with comments: LUMBAR LAMINECTOMY/DECOMPRESSION MICRODISCECTOMY 1 LEVEL (Left) - Left Lumbar two-three Laminectomy for resection of synovial cyst  SURGEON:  Surgeon(s) and Role:    * Quinnie Barcelo, MD - Primary    * Robert W Nudelman, MD - Assisting  PHYSICIAN ASSISTANT:   ASSISTANTS: Poteat, RN   ANESTHESIA:   general  EBL:  Total I/O In: 1000 [I.V.:1000] Out: -   BLOOD ADMINISTERED:none  DRAINS: none   LOCAL MEDICATIONS USED:  LIDOCAINE   SPECIMEN:  No Specimen  DISPOSITION OF SPECIMEN:  N/A  COUNTS:  YES  TOURNIQUET:  * No tourniquets in log *  DICTATION: Patient has a synovial cyst at L 23 on the left withradiculopathy. It was elected to take her to surgery for left L23 resection of synovial cyst.  Procedure: Patient was brought to the operating room and following the smooth and uncomplicated induction of general endotracheal anesthesia she was placed in a prone position on the Wilson frame. Low back was prepped and draped in the usual sterile fashion with Betadine scrub and DuraPrep. Area of planned incision was infiltrated with local lidocaine. Incision was made in the midline and carried to the lumbodorsal fascia which was incised on the left side of midline. Subperiosteal dissection was performed exposing what was felt to be L23 level. Intraoperative x-ray demonstrated marker probes at L12 and L23.  A hemi-semi-laminectomy of L 2 was performed a high-speed drill and completed with Kerrison rongeurs and a generous foraminotomy was performed overlying the superior aspect of the L3 lamina. Ligamentum flavum was detached and removed in a piecemeal fashion and the L3 nerve root was decompressed laterally with removal  of the superior aspect of the facet and ligamentum causing nerve root compression. The microscope was brought into the field and the L3 nerve root was mobilized medially and carefully dissected from the synovial cyst which was adherent to the dura.  Using microdissection, the cyst was removed and neural elements thoroughly decompressed. There was no violation of the dura.  Hemostasis was assured with bipolar electrocautery and the interspace was irrigated with Depo-Medrol and fentanyl. The lumbodorsal fascia was closed with 0 Vicryl sutures the subcutaneous tissues reapproximated 2-0 Vicryl inverted sutures and the skin edges were reapproximated with 3-0 Vicryl subcuticular stitch. The wound is dressed with Dermabond. Patient was extubated in the operating room and taken to recovery in stable and satisfactory condition having tolerated his operation well counts were correct at the end of the case.  PLAN OF CARE: Admit for overnight observation  PATIENT DISPOSITION:  PACU - hemodynamically stable.   Delay start of Pharmacological VTE agent (>24hrs) due to surgical blood loss or risk of bleeding: yes  

## 2012-06-06 NOTE — Anesthesia Postprocedure Evaluation (Signed)
  Anesthesia Post-op Note  Patient: [name]  Procedure(s) Performed: Procedure(s) (LRB) with comments: LUMBAR LAMINECTOMY/DECOMPRESSION MICRODISCECTOMY 1 LEVEL (Left) - Left Lumbar two-three Laminectomy for resection of synovial cyst  Patient Location: PACU  Anesthesia Type:General  Level of Consciousness: awake  Airway and Oxygen Therapy: Patient Spontanous Breathing  Post-op Pain: mild  Post-op Assessment: Post-op Vital signs reviewed  Post-op Vital Signs: stable  Complications: No apparent anesthesia complications

## 2012-06-06 NOTE — Anesthesia Preprocedure Evaluation (Signed)
Anesthesia Evaluation  Patient identified by MRN, date of birth, ID band Patient awake    Reviewed: Allergy & Precautions, H&P , NPO status , Patient's Chart, lab work & pertinent test results  Airway Mallampati: II  Neck ROM: full    Dental   Pulmonary shortness of breath, Current Smoker,          Cardiovascular hypertension,     Neuro/Psych Depression    GI/Hepatic GERD-  ,(+) Hepatitis -, B  Endo/Other    Renal/GU      Musculoskeletal  (+) Arthritis -,   Abdominal   Peds  Hematology   Anesthesia Other Findings   Reproductive/Obstetrics                           Anesthesia Physical Anesthesia Plan  ASA: II  Anesthesia Plan: General   Post-op Pain Management:    Induction: Intravenous  Airway Management Planned: Oral ETT  Additional Equipment:   Intra-op Plan:   Post-operative Plan: Extubation in OR  Informed Consent: I have reviewed the patients History and Physical, chart, labs and discussed the procedure including the risks, benefits and alternatives for the proposed anesthesia with the patient or authorized representative who has indicated his/her understanding and acceptance.     Plan Discussed with: CRNA and Surgeon  Anesthesia Plan Comments:         Anesthesia Quick Evaluation

## 2012-06-06 NOTE — Progress Notes (Signed)
Awake, alert, conversant.  MAEW.  Doing well.  

## 2012-06-07 MED ORDER — HYDROMORPHONE HCL 2 MG PO TABS
4.0000 mg | ORAL_TABLET | ORAL | Status: DC | PRN
  Administered 2012-06-07 (×2): 4 mg via ORAL
  Filled 2012-06-07 (×2): qty 2

## 2012-06-07 NOTE — Evaluation (Signed)
Occupational Therapy Evaluation Patient Details Name: [name] MRN: [id number] DOB: 07/18/1934 Today's Date: 06/07/2012 Time: 5621-3086 OT Time Calculation (min): 20 min  OT Assessment / Plan / Recommendation Clinical Impression  Pt s/p lumbar laminectomy/decompression microdiscectomy 1 level.  Pt able to perform functional moblity at supervision level.  Min assist required for LB bathing/dressing tasks, but pt reports she prefers to have daughter assist as needed.  All education completed. No further acute OT needs at this time.    OT Assessment  Patient does not need any further OT services    Follow Up Recommendations  No OT follow up;Supervision/Assistance - 24 hour    Barriers to Discharge      Equipment Recommendations  None recommended by OT    Recommendations for Other Services    Frequency       Precautions / Restrictions Precautions Precautions: Back Precaution Comments: Handout provided   Pertinent Vitals/Pain See vitals    ADL  Eating/Feeding: Performed;Independent Where Assessed - Eating/Feeding: Chair Grooming: Performed;Wash/dry hands;Modified independent Where Assessed - Grooming: Unsupported standing Upper Body Bathing: Simulated Where Assessed - Upper Body Bathing: Unsupported sitting Lower Body Bathing: Simulated;Minimal assistance Where Assessed - Lower Body Bathing: Unsupported sit to stand Upper Body Dressing: Performed;Modified independent Where Assessed - Upper Body Dressing: Unsupported standing Lower Body Dressing: Simulated;Minimal assistance Where Assessed - Lower Body Dressing: Unsupported sit to stand Toilet Transfer: Performed;Supervision/safety Toilet Transfer Method: Sit to Barista: Regular height toilet Toileting - Clothing Manipulation and Hygiene: Performed;Modified independent Where Assessed - Toileting Clothing Manipulation and Hygiene: Sit to stand from 3-in-1 or toilet Equipment Used: Rolling  walker Transfers/Ambulation Related to ADLs: supervision with RW ADL Comments: Pt unable to cross ankles over knees, but able to do so before sx.  Anticipate that as pt heals and experiences less pain and discomfort, she will be able to cross legs in order to access feet for LB bathing/dressing tasks.  Pt reports that she prefers to have her daughter assist her with ADL tasks as needed.    OT Diagnosis:    OT Problem List:   OT Treatment Interventions:     OT Goals    Visit Information  Last OT Received On: 06/07/12 Assistance Needed: +1    Subjective Data      Prior Functioning     Home Living Lives With: Alone Available Help at Discharge: Family;Available PRN/intermittently Type of Home: House Home Access: Level entry Home Layout: Multi-level;Able to live on main level with bedroom/bathroom Bathroom Shower/Tub: Engineer, manufacturing systems: Standard Home Adaptive Equipment: None Prior Function Level of Independence: Independent Able to Take Stairs?: Yes Driving: Yes Vocation: Retired Musician: No difficulties         Vision/Perception     Copywriter, advertising Overall Cognitive Status: Appears within functional limits for tasks assessed/performed Arousal/Alertness: Awake/alert Orientation Level: Appears intact for tasks assessed Behavior During Session: [facility] for tasks performed    Extremity/Trunk Assessment Right Upper Extremity Assessment RUE ROM/Strength/Tone: [facility] for tasks assessed Left Upper Extremity Assessment LUE ROM/Strength/Tone: WFL for tasks assessed     Mobility Bed Mobility Bed Mobility: Rolling Right;Right Sidelying to Sit;Sitting - Scoot to Edge of Bed Rolling Right: 5: Supervision Right Sidelying to Sit: 5: Supervision;With rails Sitting - Scoot to Edge of Bed: 6: Modified independent (Device/Increase time) Details for Bed Mobility Assistance: VC for sequencing of log roll technique. No physical assist  needed. Transfers Transfers: Sit to Stand;Stand to Sit Sit to Stand: 5: Supervision;From bed;From  toilet;With upper extremity assist Stand to Sit: 5: Supervision;To toilet;To chair/3-in-1;With upper extremity assist     Exercise     Balance     End of Session OT - End of Session Equipment Utilized During Treatment:  (RW) Activity Tolerance: Patient tolerated treatment well Patient left: in chair;with call bell/phone within reach;with family/visitor present Nurse Communication: Mobility status  GO Functional Assessment Tool Used: clinical judgement Functional Limitation: Self care Self Care Current Status (U9811): At least 1 percent but less than 20 percent impaired, limited or restricted Self Care Goal Status (B1478): At least 1 percent but less than 20 percent impaired, limited or restricted Self Care Discharge Status [phone]): At least 1 percent but less than 20 percent impaired, limited or restricted  06/07/2012 Cipriano Mile OTR/L Pager [phone] Office [phone]  Cipriano Mile 06/07/2012, 10:23 AM

## 2012-06-07 NOTE — Progress Notes (Signed)
UR Completed Jazzie Trampe Graves-Bigelow, RN,BSN [phone]  

## 2012-06-07 NOTE — Care Management Note (Signed)
    Page 1 of 1   06/09/2012     8:04:03 AM   CARE MANAGEMENT NOTE 06/09/2012  Patient:  [name], [name]   Account Number:  [id number]  Date Initiated:  06/07/2012  Documentation initiated by:  GRAVES-BIGELOW,BRENDA  Subjective/Objective Assessment:   Pt admited with Left Lumbar two-three Laminectomy for resection of synovial cyst.     Action/Plan:   CM will continue to monitor for disposition needs.   Anticipated DC Date:  06/07/2012   Anticipated DC Plan:  HOME/SELF CARE      DC Planning Services  CM consult      Choice offered to / List presented to:             Status of service:  Completed, signed off Medicare Important Message given?   (If response is "NO", the following Medicare IM given date fields will be blank) Date Medicare IM given:   Date Additional Medicare IM given:    Discharge Disposition:  HOME/SELF CARE  Per UR Regulation:  Reviewed for med. necessity/level of care/duration of stay  If discussed at Long Length of Stay Meetings, dates discussed:    Comments:

## 2012-06-07 NOTE — Evaluation (Signed)
Physical Therapy Evaluation Patient Details Name: [name] MRN: [id number] DOB: [birth year] Today's Date: 06/07/2012 Time: 4098-1191 PT Time Calculation (min): 24 min  PT Assessment / Plan / Recommendation Clinical Impression  Pt underwent back sx yesterday, 06-06-12.  She is moving well.  Acute PT indicated to maximize functional mobility to ensure safe d/c home with daughter.    PT Assessment  Patient needs continued PT services    Follow Up Recommendations  No PT follow up    Does the patient have the potential to tolerate intense rehabilitation      Barriers to Discharge        Equipment Recommendations  Rolling walker with 5" wheels    Recommendations for Other Services     Frequency Min 5X/week    Precautions / Restrictions Precautions Precautions: Back Precaution Comments: Handout provided   Pertinent Vitals/Pain 6/10      Mobility  Bed Mobility Bed Mobility: Sit to Supine;Sit to Sidelying Right;Rolling Left Rolling Right: 5: Supervision Rolling Left: 4: Min guard Right Sidelying to Sit: 5: Supervision;With rails Sitting - Scoot to Edge of Bed: 6: Modified independent (Device/Increase time) Sit to Sidelying Right: 4: Min assist;HOB flat Details for Bed Mobility Assistance: VC for sequencing of log roll technique. No physical assist needed. Transfers Transfers: Sit to Stand;Stand to Sit Sit to Stand: 4: Min guard;From chair/3-in-1 Stand to Sit: 4: Min guard;With upper extremity assist;To bed Details for Transfer Assistance: verbal cues for sequencing Ambulation/Gait Ambulation/Gait Assistance: 5: Supervision Assistive device: Rolling walker Gait Pattern: Within Functional Limits Gait velocity: decreased    Exercises     PT Diagnosis: Difficulty walking;Acute pain  PT Problem List: Decreased activity tolerance;Decreased mobility;Pain;Decreased knowledge of precautions PT Treatment Interventions: DME instruction;Gait training;Stair  training;Functional mobility training;Therapeutic activities;Patient/family education   PT Goals Acute Rehab PT Goals PT Goal Formulation: With patient Potential to Achieve Goals: Good Pt will go Supine/Side to Sit: with modified independence PT Goal: Supine/Side to Sit - Progress: Goal set today Pt will go Sit to Supine/Side: with modified independence PT Goal: Sit to Supine/Side - Progress: Goal set today Pt will go Sit to Stand: with modified independence PT Goal: Sit to Stand - Progress: Goal set today Pt will go Stand to Sit: with modified independence PT Goal: Stand to Sit - Progress: Goal set today Pt will Ambulate: >150 feet;with supervision PT Goal: Ambulate - Progress: Goal set today Pt will Go Up / Down Stairs: 1-2 stairs;with rail(s);with min assist PT Goal: Up/Down Stairs - Progress: Goal set today  Visit Information  Last PT Received On: 06/07/12 Assistance Needed: +1    Subjective Data  Subjective: "I am feeling pretty good." Patient Stated Goal: to go to her daughter's house in [place].   Prior Functioning  Home Living Lives With: Alone Available Help at Discharge: Family;Available PRN/intermittently Type of Home: House Home Access: Level entry Home Layout: Multi-level;Able to live on main level with bedroom/bathroom Bathroom Shower/Tub: Engineer, manufacturing systems: Standard Home Adaptive Equipment: None Prior Function Level of Independence: Independent Able to Take Stairs?: Yes Driving: Yes Vocation: Retired Musician: No difficulties    Copywriter, advertising Overall Cognitive Status: Appears within functional limits for tasks assessed/performed Arousal/Alertness: Awake/alert Orientation Level: Appears intact for tasks assessed Behavior During Session: [facility] for tasks performed    Extremity/Trunk Assessment Right Upper Extremity Assessment RUE ROM/Strength/Tone: [facility] for tasks assessed Left Upper Extremity Assessment LUE  ROM/Strength/Tone: [facility] for tasks assessed   Balance    End of Session  PT - End of Session Equipment Utilized During Treatment: Gait belt Activity Tolerance: Patient tolerated treatment well Patient left: in bed Nurse Communication: Mobility status  GP Functional Assessment Tool Used: clinical judgement Functional Limitation: Mobility: Walking and moving around Mobility: Walking and Moving Around Current Status (U2725): At least 1 percent but less than 20 percent impaired, limited or restricted Mobility: Walking and Moving Around Goal Status [phone]): 0 percent impaired, limited or restricted   Ilda Foil 06/07/2012, 10:29 AM  Aida Raider, PT  Office # [phone] Pager [phone]

## 2012-06-07 NOTE — Progress Notes (Signed)
Patient ID: [name], female   DOB: 10-03-1934, 78 y.o.   MRN: [id number] Subjective: Patient reports incisional pain only  Objective: Vital signs in last 24 hours: Temp:  [97 F (36.1 C)-98.1 F (36.7 C)] 98.1 F (36.7 C) (02/08 0600) Pulse Rate:  [51-79] 60 (02/08 0600) Resp:  [9-24] 16 (02/08 0600) BP: (99-131)/(43-66) 99/52 mmHg (02/08 0600) SpO2:  [91 %-100 %] 97 % (02/08 0600) Weight:  [64.9 kg (143 lb 1.3 oz)] 64.9 kg (143 lb 1.3 oz) (02/07 1730)  Intake/Output from previous day: 02/07 0701 - 02/08 0700 In: 1000 [I.V.:1000] Out: -  Intake/Output this shift:    Wound:clean and dry; neuro intact  Lab Results:  Recent Labs  06/06/12 1028  WBC 7.8  HGB 13.5  HCT 37.6  PLT 333   BMET  Recent Labs  06/06/12 1028  NA 141  K 3.7  CL 105  CO2 27  GLUCOSE 90  BUN 10  CREATININE 0.82  CALCIUM 9.4    Studies/Results: Dg Chest 2 View  06/06/2012  *RADIOLOGY REPORT*  Clinical Data: Preoperative assessment for lumbar spine surgery, hypertension  CHEST - 2 VIEW  Comparison: 01/01/2011  Findings: Borderline enlargement of cardiac silhouette. Mediastinal contours and pulmonary vascularity normal. Minimal peribronchial thickening without infiltrate, pleural effusion or pneumothorax. Bilateral glenohumeral degenerative changes. No acute osseous findings.  IMPRESSION: Minimal bronchitic changes. Borderline enlargement of cardiac silhouette.   Original Report Authenticated By: Ulyses Southward, M.D.    Dg Lumbar Spine 1 View  06/06/2012  *RADIOLOGY REPORT*  Clinical Data: 77 year old female undergoing lumbar surgery.  LUMBAR SPINE - 1 VIEW  Comparison: Lumbar MRI 05/26/2012 and earlier.  Findings: Same numbering system used as on the comparison. Previous L4-L5 transpedicular and interbody fusion.  Single intraoperative cross-table lateral view lumbar spine provided labeled film #1 at 1510 hours.  Surgical probe is present at the L2 pedicle level.  IMPRESSION: Intraoperative  localization at L2.   Original Report Authenticated By: Erskine Speed, M.D.     Assessment/Plan: Doing well POD 1. Pain only controlled with IV meds. Will change her po meds to oral dilaudid and see if that works better.   LOS: 1 day  as above   Reinaldo Meeker, MD 06/07/2012, 8:43 AM

## 2012-06-07 NOTE — Clinical Social Work Note (Signed)
CSW consulted for SNF. PT/OT with no PT/OT f/u recommendations. CSW signing off as no other CSW needs identified at this time.  Dellie Burns, MSW, LCSWA [phone] (Weekends 8:00am-4:30pm)

## 2012-06-08 MED ORDER — HYDROCODONE-ACETAMINOPHEN 5-325 MG PO TABS: 1.0000 | ORAL_TABLET | ORAL | Status: DC | PRN

## 2012-06-08 NOTE — Discharge Summary (Signed)
Physician Discharge Summary  Patient ID: [name] MRN: [id number] DOB/AGE: 09/09/1934 78 y.o.  Admit date: 06/06/2012 Discharge date: 06/08/2012  Admission Diagnoses:  Lumbar synovial cyst, lumbar spondylosis, lumbar radiculopathy  Discharge Diagnoses:  Lumbar synovial cyst, lumbar spondylosis, lumbar radiculopathy  Discharged Condition: good  Hospital Course: Patient was admitted underwent a left L2-3 lumbar laminectomy and resection of synovial cyst by Dr. Maeola Harman. Postoperatively she has done well. She is up and into the in the halls. The wound is healing nicely. She is asking to be discharged to home. She's been given instructions regarding wound care and activities. She is to return for followup with Dr. Venetia Maxon in 3 weeks.  Discharge Exam: Blood pressure 136/58, pulse 64, temperature 98.5 F (36.9 C), temperature source Oral, resp. rate 16, height 5' 4.17" (1.63 m), weight 64.9 kg (143 lb 1.3 oz), SpO2 97.00%.  Disposition: Home     Medication List    TAKE these medications       amitriptyline 25 MG tablet  Commonly known as:  ELAVIL  Take 25 mg by mouth at bedtime.     aspirin EC 81 MG tablet  Take 81 mg by mouth daily.     budesonide-formoterol 160-4.5 MCG/ACT inhaler  Commonly known as:  SYMBICORT  Inhale 1 puff into the lungs 2 (two) times daily.     cetirizine 10 MG tablet  Commonly known as:  ZYRTEC  Take 10 mg by mouth daily.     cholecalciferol 1000 UNITS tablet  Commonly known as:  VITAMIN D  Take 1,000 Units by mouth daily.     esomeprazole 40 MG capsule  Commonly known as:  NEXIUM  Take 1 capsule (40 mg total) by mouth daily before breakfast.     fluticasone 50 MCG/ACT nasal spray  Commonly known as:  FLONASE  Place 2 sprays into the nose daily.     furosemide 20 MG tablet  Commonly known as:  LASIX  Take 10 mg by mouth daily.     HYDROcodone-acetaminophen 5-325 MG per tablet  Commonly known as:  NORCO/VICODIN  Take 1-2 tablets by  mouth every 4 (four) hours as needed for pain.     HYDROcodone-acetaminophen 5-325 MG per tablet  Commonly known as:  NORCO/VICODIN  Take 1 tablet by mouth every 6 (six) hours as needed. For pain     losartan 50 MG tablet  Commonly known as:  COZAAR  Take 50 mg by mouth daily.     multivitamin with minerals Tabs  Take 1 tablet by mouth daily.     oxyCODONE-acetaminophen 10-325 MG per tablet  Commonly known as:  PERCOCET  Take 1 tablet by mouth every 4 (four) hours as needed. For pain     ranitidine 150 MG tablet  Commonly known as:  ZANTAC  Take 150 mg by mouth daily.     simvastatin 10 MG tablet  Commonly known as:  ZOCOR  Take 10 mg by mouth at bedtime.         SignedHewitt Shorts, MD 06/08/2012, 10:00 AM

## 2012-06-08 NOTE — Progress Notes (Signed)
Physical Therapy Treatment Patient Details Name: [name] MRN: [id number] DOB: Oct 28, 1934 Today's Date: 06/08/2012 Time: 4132-4401 PT Time Calculation (min): 15 min  PT Assessment / Plan / Recommendation Comments on Treatment Session  pt rpesnts with Lumbar Lami.  pt moving great and anticipate D/C to home today.  Ready for D/C from PT stand point.      Follow Up Recommendations  No PT follow up     Does the patient have the potential to tolerate intense rehabilitation     Barriers to Discharge        Equipment Recommendations       Recommendations for Other Services    Frequency Min 5X/week   Plan Discharge plan remains appropriate;Frequency remains appropriate    Precautions / Restrictions Precautions Precautions: Back Precaution Comments: Reviewed back precautions.   Restrictions Weight Bearing Restrictions: No   Pertinent Vitals/Pain Indicates minimal pain.      Mobility  Bed Mobility Bed Mobility: Right Sidelying to Sit;Sitting - Scoot to Edge of Bed Right Sidelying to Sit: 6: Modified independent (Device/Increase time);HOB flat Sitting - Scoot to Edge of Bed: 6: Modified independent (Device/Increase time) Transfers Transfers: Sit to Stand;Stand to Sit Sit to Stand: 5: Supervision;With upper extremity assist;From bed Stand to Sit: 5: Supervision;With upper extremity assist;To chair/3-in-1;With armrests Ambulation/Gait Ambulation/Gait Assistance: 5: Supervision Ambulation Distance (Feet): 300 Feet Assistive device: Rolling walker Ambulation/Gait Assistance Details: used RW for first half of gait and no AD during second half of gait.  pt moving slowly and cautiously without AD, but moving safely.  Discussed whether pt would need RW at D/C and at this time pt safe to D/C without AD.   Gait Pattern: Within Functional Limits Stairs: Yes Stairs Assistance: 5: Supervision Stairs Assistance Details (indicate cue type and reason): cues for safe tefchnique with RW.    Stair Management Technique: No rails;Backwards;With walker Number of Stairs: 1 Wheelchair Mobility Wheelchair Mobility: No    Exercises     PT Diagnosis:    PT Problem List:   PT Treatment Interventions:     PT Goals Acute Rehab PT Goals Potential to Achieve Goals: Good PT Goal: Supine/Side to Sit - Progress: Met PT Goal: Sit to Stand - Progress: Progressing toward goal PT Goal: Stand to Sit - Progress: Progressing toward goal PT Goal: Ambulate - Progress: Progressing toward goal PT Goal: Up/Down Stairs - Progress: Met  Visit Information  Last PT Received On: 06/08/12 Assistance Needed: +1    Subjective Data  Subjective: I think I get to go home today.     Cognition  Cognition Overall Cognitive Status: Appears within functional limits for tasks assessed/performed Arousal/Alertness: Awake/alert Orientation Level: Appears intact for tasks assessed Behavior During Session: [facility] for tasks performed    Balance  Balance Balance Assessed: No  End of Session PT - End of Session Equipment Utilized During Treatment: Gait belt Activity Tolerance: Patient tolerated treatment well Patient left: in chair;with call bell/phone within reach Nurse Communication: Mobility status   GP Functional Assessment Tool Used: clinical judgement Functional Limitation: Mobility: Walking and moving around Mobility: Walking and Moving Around Current Status (U2725): At least 1 percent but less than 20 percent impaired, limited or restricted Mobility: Walking and Moving Around Goal Status [phone]): At least 1 percent but less than 20 percent impaired, limited or restricted Mobility: Walking and Moving Around Discharge Status [phone]): At least 1 percent but less than 20 percent impaired, limited or restricted   Sunny Schlein, [place] [phone] 06/08/2012, 1:12  PM   

## 2012-06-09 ENCOUNTER — Encounter (HOSPITAL_COMMUNITY): Payer: Self-pay | Admitting: Neurosurgery

## 2012-08-21 ENCOUNTER — Telehealth: Payer: Self-pay | Admitting: General Practice

## 2012-08-21 NOTE — Telephone Encounter (Signed)
APPT MADE FOR 10/27/12 WITH MAE MARTIN- CPE

## 2012-08-26 ENCOUNTER — Telehealth: Payer: Self-pay

## 2012-08-26 NOTE — Telephone Encounter (Signed)
[name]  This pt will need CT follow up. Would you check her insurance and let her know

## 2012-08-28 ENCOUNTER — Ambulatory Visit (INDEPENDENT_AMBULATORY_CARE_PROVIDER_SITE_OTHER): Payer: Medicare Other | Admitting: Nurse Practitioner

## 2012-08-28 ENCOUNTER — Telehealth: Payer: Self-pay | Admitting: Nurse Practitioner

## 2012-08-28 VITALS — BP 134/67 | HR 71 | Temp 98.4°F | Ht 64.0 in | Wt 147.0 lb

## 2012-08-28 DIAGNOSIS — J019 Acute sinusitis, unspecified: Secondary | ICD-10-CM

## 2012-08-28 MED ORDER — AZITHROMYCIN 250 MG PO TABS: ORAL_TABLET | ORAL | Status: DC

## 2012-08-28 NOTE — Patient Instructions (Addendum)

## 2012-08-28 NOTE — Progress Notes (Signed)
  Subjective:    Patient ID: [name], female    DOB: Dec 31, 1934, 78 y.o.   MRN: [id number]  HPI Patient in complaining of cough and congestion . Started 6day. Has gotten unchanged since started. Associated symptoms include fever and chills. He has tried nyquil OTC with slight  Relief.     Review of Systems  Constitutional: Positive for fever and chills.  HENT: Positive for ear pain, congestion, rhinorrhea and sinus pressure.   Eyes: Negative.   Respiratory: Positive for cough (productive- greenish).   Cardiovascular: Negative.   Gastrointestinal: Negative.   Psychiatric/Behavioral: Negative.        Objective:   Physical Exam  Constitutional: She is oriented to person, place, and time. She appears well-developed and well-nourished.  HENT:  Right Ear: Hearing, tympanic membrane, external ear and ear canal normal.  Left Ear: Hearing, tympanic membrane, external ear and ear canal normal.  Nose: Mucosal edema and rhinorrhea present.  Mouth/Throat: Posterior oropharyngeal erythema present.  Eyes: EOM are normal. Pupils are equal, round, and reactive to light.  Neck: Normal range of motion. Neck supple.  Cardiovascular: Normal rate, normal heart sounds and intact distal pulses.   Pulmonary/Chest: Effort normal and breath sounds normal.  Abdominal: Soft. Bowel sounds are normal.  Lymphadenopathy:    She has no cervical adenopathy.  Neurological: She is alert and oriented to person, place, and time.  Skin: Skin is warm and dry.  Psychiatric: She has a normal mood and affect. Her behavior is normal. Judgment and thought content normal.   BP 134/67  Pulse 71  Temp(Src) 98.4 F (36.9 C) (Oral)  Ht 5\' 4"  (1.626 m)  Wt 147 lb (66.679 kg)  BMI 25.22 kg/m2        Assessment & Plan:  Acute sinusitis  Z pak as RX 0 refills 1. Take meds as prescribed 2. Use a cool mist humidifier especially during the winter months and when heat has  been humid. 3. Use saline nose sprays  frequently 4. Saline irrigations of the nose can be very helpful if done frequently.  * 4X daily for 1 week*  * Use of a nettie pot can be helpful with this. Follow directions with this* 5. Drink plenty of fluids 6. Keep thermostat turn down low 7.For any cough or congestion  Use plain Mucinex- regular strength or max strength is fine   * Children- consult with Pharmacist for dosing 8. For fever or aces or pains- take tylenol or ibuprofen appropriate for age and weight.  * for fevers greater than 101 orally you may alternate ibuprofen and tylenol every  3 hours.   Mary-Margaret Daphine Deutscher, FNP

## 2012-08-28 NOTE — Telephone Encounter (Signed)
APT MADE 

## 2012-08-29 ENCOUNTER — Other Ambulatory Visit: Payer: Self-pay

## 2012-08-29 DIAGNOSIS — R911 Solitary pulmonary nodule: Secondary | ICD-10-CM

## 2012-09-01 ENCOUNTER — Other Ambulatory Visit: Payer: Self-pay

## 2012-09-01 DIAGNOSIS — R911 Solitary pulmonary nodule: Secondary | ICD-10-CM

## 2012-09-01 NOTE — Telephone Encounter (Signed)
Spoke with debbie boles and she has taken care of this

## 2012-09-08 ENCOUNTER — Other Ambulatory Visit (HOSPITAL_COMMUNITY): Payer: Medicare Other

## 2012-09-10 ENCOUNTER — Ambulatory Visit (HOSPITAL_COMMUNITY)
Admission: RE | Admit: 2012-09-10 | Discharge: 2012-09-10 | Disposition: A | Discharge status: Home / Self Care | Payer: Medicare Other | Source: Ambulatory Visit | Attending: Family Medicine | Admitting: Family Medicine

## 2012-09-10 DIAGNOSIS — F17200 Nicotine dependence, unspecified, uncomplicated: Secondary | ICD-10-CM | POA: Insufficient documentation

## 2012-09-10 DIAGNOSIS — R911 Solitary pulmonary nodule: Secondary | ICD-10-CM | POA: Insufficient documentation

## 2012-09-10 DIAGNOSIS — I10 Essential (primary) hypertension: Secondary | ICD-10-CM | POA: Insufficient documentation

## 2012-09-12 ENCOUNTER — Other Ambulatory Visit: Payer: Self-pay | Admitting: Family Medicine

## 2012-10-03 ENCOUNTER — Other Ambulatory Visit: Payer: Self-pay | Admitting: *Deleted

## 2012-10-03 MED ORDER — ONDANSETRON HCL 4 MG PO TABS: 4.0000 mg | ORAL_TABLET | Freq: Two times a day (BID) | ORAL | Status: DC | PRN

## 2012-10-03 NOTE — Telephone Encounter (Signed)
DIDN'T SEE IN EPIC OR CHART BUT LAST RF 07/09/12.

## 2012-10-14 ENCOUNTER — Other Ambulatory Visit: Payer: Self-pay | Admitting: General Practice

## 2012-10-22 ENCOUNTER — Other Ambulatory Visit: Payer: Self-pay | Admitting: *Deleted

## 2012-10-22 MED ORDER — RALOXIFENE HCL 60 MG PO TABS: 60.0000 mg | ORAL_TABLET | Freq: Every day | ORAL | Status: DC

## 2012-10-22 NOTE — Telephone Encounter (Signed)
THIS MED IS NOT LISTED IN EPIC FOR PT BUT IS IN PAPER CHART. LAST RF 06/02/12 FIR #[id number]. PLEASE REVIEW. THANKS.

## 2012-10-27 ENCOUNTER — Encounter: Payer: Self-pay | Admitting: General Practice

## 2012-10-27 ENCOUNTER — Ambulatory Visit (INDEPENDENT_AMBULATORY_CARE_PROVIDER_SITE_OTHER): Payer: Medicare Other | Admitting: General Practice

## 2012-10-27 ENCOUNTER — Encounter: Payer: Self-pay | Admitting: Nurse Practitioner

## 2012-10-27 VITALS — BP 118/73 | HR 66 | Temp 97.9°F | Ht 62.5 in | Wt 145.0 lb

## 2012-10-27 DIAGNOSIS — J309 Allergic rhinitis, unspecified: Secondary | ICD-10-CM

## 2012-10-27 DIAGNOSIS — E785 Hyperlipidemia, unspecified: Secondary | ICD-10-CM

## 2012-10-27 DIAGNOSIS — G4700 Insomnia, unspecified: Secondary | ICD-10-CM

## 2012-10-27 DIAGNOSIS — I10 Essential (primary) hypertension: Secondary | ICD-10-CM

## 2012-10-27 DIAGNOSIS — K219 Gastro-esophageal reflux disease without esophagitis: Secondary | ICD-10-CM

## 2012-10-27 DIAGNOSIS — J4530 Mild persistent asthma, uncomplicated: Secondary | ICD-10-CM

## 2012-10-27 DIAGNOSIS — F411 Generalized anxiety disorder: Secondary | ICD-10-CM

## 2012-10-27 DIAGNOSIS — J45909 Unspecified asthma, uncomplicated: Secondary | ICD-10-CM

## 2012-10-27 DIAGNOSIS — Z09 Encounter for follow-up examination after completed treatment for conditions other than malignant neoplasm: Secondary | ICD-10-CM

## 2012-10-27 LAB — POCT CBC
Granulocyte percent: 59.3 %G (ref 37–80)
HCT, POC: 36.8 % — AB (ref 37.7–47.9)
MCV: 89.1 fL (ref 80–97)
POC Granulocyte: 3.2 (ref 2–6.9)
Platelet Count, POC: 318 10*3/uL (ref 142–424)
RBC: 4.1 M/uL (ref 4.04–5.48)

## 2012-10-27 MED ORDER — ALPRAZOLAM 0.5 MG PO TABS: 0.5000 mg | ORAL_TABLET | Freq: Two times a day (BID) | ORAL | Status: DC

## 2012-10-27 MED ORDER — CETIRIZINE HCL 10 MG PO TABS: 10.0000 mg | ORAL_TABLET | Freq: Every day | ORAL | Status: DC

## 2012-10-27 MED ORDER — ESOMEPRAZOLE MAGNESIUM 40 MG PO CPDR: 40.0000 mg | DELAYED_RELEASE_CAPSULE | Freq: Every day | ORAL | Status: DC

## 2012-10-27 MED ORDER — BUDESONIDE-FORMOTEROL FUMARATE 160-4.5 MCG/ACT IN AERO: 1.0000 | INHALATION_SPRAY | Freq: Two times a day (BID) | RESPIRATORY_TRACT | Status: DC

## 2012-10-27 MED ORDER — LOSARTAN POTASSIUM 50 MG PO TABS: 50.0000 mg | ORAL_TABLET | Freq: Every day | ORAL | Status: DC

## 2012-10-27 MED ORDER — SIMVASTATIN 10 MG PO TABS: 10.0000 mg | ORAL_TABLET | Freq: Every day | ORAL | Status: DC

## 2012-10-27 MED ORDER — AMITRIPTYLINE HCL 25 MG PO TABS: 25.0000 mg | ORAL_TABLET | Freq: Every day | ORAL | Status: DC

## 2012-10-27 NOTE — Progress Notes (Signed)
  Subjective:    Patient ID: [name], female    DOB: 01-Dec-1934, 78 y.o.   MRN: [id number]  HPI Presents today for follow up of 3 month follow up of chronic health conditions. She has a history of hypertension, hyperlipidemia, Gerd, chronic back pain, and Asthma. She reports taking medications as prescribed. She reports her anxiety is being well managed. She also reports being able to sleep when taking elavil. She denies muscle aches.      Review of Systems  Constitutional: Negative for fever and chills.  HENT: Positive for neck pain.        Chronic neck pain, she has been seen by ortho specialist  Respiratory: Negative for chest tightness and shortness of breath.   Cardiovascular: Negative for chest pain and palpitations.  Gastrointestinal: Positive for constipation. Negative for nausea, vomiting, abdominal pain, blood in stool and abdominal distention.       Occasional constipation from Pain meds  Genitourinary: Negative for dysuria, hematuria, difficulty urinating and pelvic pain.  Musculoskeletal: Positive for back pain.  Neurological: Negative for dizziness, weakness, numbness and headaches.       Objective:   Physical Exam  Constitutional: She is oriented to person, place, and time. She appears well-developed and well-nourished.  HENT:  Head: Normocephalic and atraumatic.  Right Ear: External ear normal.  Left Ear: External ear normal.  Mouth/Throat: Oropharynx is clear and moist.  Eyes: EOM are normal.  Neck:  Limited range of motion due to chronic neck issues  Cardiovascular: Normal rate, regular rhythm and normal heart sounds.   Pulmonary/Chest: Effort normal and breath sounds normal. No respiratory distress. She exhibits no tenderness.  Abdominal: Soft. Bowel sounds are normal. She exhibits no distension. There is no tenderness.  Neurological: She is alert and oriented to person, place, and time.  Skin: Skin is warm and dry.  Psychiatric: She has a normal mood  and affect.          Assessment & Plan:  1. Other and unspecified hyperlipidemia - NMR Lipoprofile with Lipids - Vitamin D 25 hydroxy - simvastatin (ZOCOR) 10 MG tablet; Take 1 tablet (10 mg total) by mouth at bedtime.  Dispense: 30 tablet; Refill: 3  2. Follow-up exam, 3-6 months since previous exam - POCT CBC - COMPLETE METABOLIC PANEL WITH GFR  3. Anxiety state, unspecified - ALPRAZolam (XANAX) 0.5 MG tablet; Take 1 tablet (0.5 mg total) by mouth 2 (two) times daily.  Dispense: 60 tablet; Refill: 0  4. Insomnia - amitriptyline (ELAVIL) 25 MG tablet; Take 1 tablet (25 mg total) by mouth at bedtime.  Dispense: 30 tablet; Refill: 3  5. Allergic rhinitis - cetirizine (ZYRTEC) 10 MG tablet; Take 1 tablet (10 mg total) by mouth daily.  Dispense: 30 tablet; Refill: 3  6. GERD (gastroesophageal reflux disease) - esomeprazole (NEXIUM) 40 MG capsule; Take 1 capsule (40 mg total) by mouth daily before breakfast.  Dispense: 30 capsule; Refill: 3  7. Essential hypertension, benign - losartan (COZAAR) 50 MG tablet; Take 1 tablet (50 mg total) by mouth daily.  Dispense: 30 tablet; Refill: 3  8. Asthma, chronic, mild persistent, uncomplicated - budesonide-formoterol (SYMBICORT) 160-4.5 MCG/ACT inhaler; Inhale 1 puff into the lungs 2 (two) times daily.  Dispense: 1 Inhaler; Refill: 3 Continue all current medications Labs pending F/u in 3 months Discussed exercise and diet  Patient verbalized understanding Coralie Keens, FNP-C

## 2012-10-27 NOTE — Patient Instructions (Addendum)
Hypertriglyceridemia  Diet for High blood levels of Triglycerides Most fats in food are triglycerides. Triglycerides in your blood are stored as fat in your body. High levels of triglycerides in your blood may put you at a greater risk for heart disease and stroke.  Normal triglyceride levels are less than 150 mg/dL. Borderline high levels are 150-199 mg/dl. High levels are 200 - 499 mg/dL, and very high triglyceride levels are greater than 500 mg/dL. The decision to treat high triglycerides is generally based on the level. For people with borderline or high triglyceride levels, treatment includes weight loss and exercise. Drugs are recommended for people with very high triglyceride levels. Many people who need treatment for high triglyceride levels have metabolic syndrome. This syndrome is a collection of disorders that often include: insulin resistance, high blood pressure, blood clotting problems, high cholesterol and triglycerides. TESTING PROCEDURE FOR TRIGLYCERIDES  You should not eat 4 hours before getting your triglycerides measured. The normal range of triglycerides is between 10 and 250 milligrams per deciliter (mg/dl). Some people may have extreme levels (1000 or above), but your triglyceride level may be too high if it is above 150 mg/dl, depending on what other risk factors you have for heart disease.  People with high blood triglycerides may also have high blood cholesterol levels. If you have high blood cholesterol as well as high blood triglycerides, your risk for heart disease is probably greater than if you only had high triglycerides. High blood cholesterol is one of the main risk factors for heart disease. CHANGING YOUR DIET  Your weight can affect your blood triglyceride level. If you are more than 20% above your ideal body weight, you may be able to lower your blood triglycerides by losing weight. Eating less and exercising regularly is the best way to combat this. Fat provides more  calories than any other food. The best way to lose weight is to eat less fat. Only 30% of your total calories should come from fat. Less than 7% of your diet should come from saturated fat. A diet low in fat and saturated fat is the same as a diet to decrease blood cholesterol. By eating a diet lower in fat, you may lose weight, lower your blood cholesterol, and lower your blood triglyceride level.  Eating a diet low in fat, especially saturated fat, may also help you lower your blood triglyceride level. Ask your dietitian to help you figure how much fat you can eat based on the number of calories your caregiver has prescribed for you.  Exercise, in addition to helping with weight loss may also help lower triglyceride levels.   Alcohol can increase blood triglycerides. You may need to stop drinking alcoholic beverages.  Too much carbohydrate in your diet may also increase your blood triglycerides. Some complex carbohydrates are necessary in your diet. These may include bread, rice, potatoes, other starchy vegetables and cereals.  Reduce "simple" carbohydrates. These may include pure sugars, candy, honey, and jelly without losing other nutrients. If you have the kind of high blood triglycerides that is affected by the amount of carbohydrates in your diet, you will need to eat less sugar and less high-sugar foods. Your caregiver can help you with this.  Adding 2-4 grams of fish oil (EPA+ DHA) may also help lower triglycerides. Speak with your caregiver before adding any supplements to your regimen. Following the Diet  Maintain your ideal weight. Your caregivers can help you with a diet. Generally, eating less food and getting more   exercise will help you lose weight. Joining a weight control group may also help. Ask your caregivers for a good weight control group in your area.  Eat low-fat foods instead of high-fat foods. This can help you lose weight too.  These foods are lower in fat. Eat MORE of these:    Dried beans, peas, and lentils.  Egg whites.  Low-fat cottage cheese.  Fish.  Lean cuts of meat, such as round, sirloin, rump, and flank (cut extra fat off meat you fix).  Whole grain breads, cereals and pasta.  Skim and nonfat dry milk.  Low-fat yogurt.  Poultry without the skin.  Cheese made with skim or part-skim milk, such as mozzarella, parmesan, farmers', ricotta, or pot cheese. These are higher fat foods. Eat LESS of these:   Whole milk and foods made from whole milk, such as American, blue, cheddar, monterey jack, and swiss cheese  High-fat meats, such as luncheon meats, sausages, knockwurst, bratwurst, hot dogs, ribs, corned beef, ground pork, and regular ground beef.  Fried foods. Limit saturated fats in your diet. Substituting unsaturated fat for saturated fat may decrease your blood triglyceride level. You will need to read package labels to know which products contain saturated fats.  These foods are high in saturated fat. Eat LESS of these:   Fried pork skins.  Whole milk.  Skin and fat from poultry.  Palm oil.  Butter.  Shortening.  Cream cheese.  Bacon.  Margarines and baked goods made from listed oils.  Vegetable shortenings.  Chitterlings.  Fat from meats.  Coconut oil.  Palm kernel oil.  Lard.  Cream.  Sour cream.  Fatback.  Coffee whiteners and non-dairy creamers made with these oils.  Cheese made from whole milk. Use unsaturated fats (both polyunsaturated and monounsaturated) moderately. Remember, even though unsaturated fats are better than saturated fats; you still want a diet low in total fat.  These foods are high in unsaturated fat:   Canola oil.  Sunflower oil.  Mayonnaise.  Almonds.  Peanuts.  Pine nuts.  Margarines made with these oils.  Safflower oil.  Olive oil.  Avocados.  Cashews.  Peanut butter.  Sunflower seeds.  Soybean oil.  Peanut  oil.  Olives.  Pecans.  Walnuts.  Pumpkin seeds. Avoid sugar and other high-sugar foods. This will decrease carbohydrates without decreasing other nutrients. Sugar in your food goes rapidly to your blood. When there is excess sugar in your blood, your liver may use it to make more triglycerides. Sugar also contains calories without other important nutrients.  Eat LESS of these:   Sugar, brown sugar, powdered sugar, jam, jelly, preserves, honey, syrup, molasses, pies, candy, cakes, cookies, frosting, pastries, colas, soft drinks, punches, fruit drinks, and regular gelatin.  Avoid alcohol. Alcohol, even more than sugar, may increase blood triglycerides. In addition, alcohol is high in calories and low in nutrients. Ask for sparkling water, or a diet soft drink instead of an alcoholic beverage. Suggestions for planning and preparing meals   Bake, broil, grill or roast meats instead of frying.  Remove fat from meats and skin from poultry before cooking.  Add spices, herbs, lemon juice or vinegar to vegetables instead of salt, rich sauces or gravies.  Use a non-stick skillet without fat or use no-stick sprays.  Cool and refrigerate stews and broth. Then remove the hardened fat floating on the surface before serving.  Refrigerate meat drippings and skim off fat to make low-fat gravies.  Serve more fish.  Use less butter,   margarine and other high-fat spreads on bread or vegetables.  Use skim or reconstituted non-fat dry milk for cooking.  Cook with low-fat cheeses.  Substitute low-fat yogurt or cottage cheese for all or part of the sour cream in recipes for sauces, dips or congealed salads.  Use half yogurt/half mayonnaise in salad recipes.  Substitute evaporated skim milk for cream. Evaporated skim milk or reconstituted non-fat dry milk can be whipped and substituted for whipped cream in certain recipes.  Choose fresh fruits for dessert instead of high-fat foods such as pies or  cakes. Fruits are naturally low in fat. When Dining Out   Order low-fat appetizers such as fruit or vegetable juice, pasta with vegetables or tomato sauce.  Select clear, rather than cream soups.  Ask that dressings and gravies be served on the side. Then use less of them.  Order foods that are baked, broiled, poached, steamed, stir-fried, or roasted.  Ask for margarine instead of butter, and use only a small amount.  Drink sparkling water, unsweetened tea or coffee, or diet soft drinks instead of alcohol or other sweet beverages. QUESTIONS AND ANSWERS ABOUT OTHER FATS IN THE BLOOD: SATURATED FAT, TRANS FAT, AND CHOLESTEROL What is trans fat? Trans fat is a type of fat that is formed when vegetable oil is hardened through a process called hydrogenation. This process helps makes foods more solid, gives them shape, and prolongs their shelf life. Trans fats are also called hydrogenated or partially hydrogenated oils.  What do saturated fat, trans fat, and cholesterol in foods have to do with heart disease? Saturated fat, trans fat, and cholesterol in the diet all raise the level of LDL "bad" cholesterol in the blood. The higher the LDL cholesterol, the greater the risk for coronary heart disease (CHD). Saturated fat and trans fat raise LDL similarly.  What foods contain saturated fat, trans fat, and cholesterol? High amounts of saturated fat are found in animal products, such as fatty cuts of meat, chicken skin, and full-fat dairy products like butter, whole milk, cream, and cheese, and in tropical vegetable oils such as palm, palm kernel, and coconut oil. Trans fat is found in some of the same foods as saturated fat, such as vegetable shortening, some margarines (especially hard or stick margarine), crackers, cookies, baked goods, fried foods, salad dressings, and other processed foods made with partially hydrogenated vegetable oils. Small amounts of trans fat also occur naturally in some animal  products, such as milk products, beef, and lamb. Foods high in cholesterol include liver, other organ meats, egg yolks, shrimp, and full-fat dairy products. How can I use the new food label to make heart-healthy food choices? Check the Nutrition Facts panel of the food label. Choose foods lower in saturated fat, trans fat, and cholesterol. For saturated fat and cholesterol, you can also use the Percent Daily Value (%DV): 5% DV or less is low, and 20% DV or more is high. (There is no %DV for trans fat.) Use the Nutrition Facts panel to choose foods low in saturated fat and cholesterol, and if the trans fat is not listed, read the ingredients and limit products that list shortening or hydrogenated or partially hydrogenated vegetable oil, which tend to be high in trans fat. POINTS TO REMEMBER:   Discuss your risk for heart disease with your caregivers, and take steps to reduce risk factors.  Change your diet. Choose foods that are low in saturated fat, trans fat, and cholesterol.  Add exercise to your daily routine if   it is not already being done. Participate in physical activity of moderate intensity, like brisk walking, for at least 30 minutes on most, and preferably all days of the week. No time? Break the 30 minutes into three, 10-minute segments during the day.  Stop smoking. If you do smoke, contact your caregiver to discuss ways in which they can help you quit.  Do not use street drugs.  Maintain a normal weight.  Maintain a healthy blood pressure.  Keep up with your blood work for checking the fats in your blood as directed by your caregiver. Document Released: 02/02/2004 Document Revised: 10/16/2011 Document Reviewed: 08/30/2008 [employer] Patient Information 2014 [employer].  

## 2012-10-28 LAB — COMPLETE METABOLIC PANEL WITH GFR
ALT: 12 U/L (ref 0–35)
AST: 20 U/L (ref 0–37)
Albumin: 3.7 g/dL (ref 3.5–5.2)
Alkaline Phosphatase: 84 U/L (ref 39–117)
BUN: 12 mg/dL (ref 6–23)
Calcium: 8.9 mg/dL (ref 8.4–10.5)
Chloride: 110 mEq/L (ref 96–112)
Potassium: 4 mEq/L (ref 3.5–5.3)
Sodium: 144 mEq/L (ref 135–145)
Total Protein: 6.2 g/dL (ref 6.0–8.3)

## 2012-10-29 LAB — NMR LIPOPROFILE WITH LIPIDS
Cholesterol, Total: 149 mg/dL (ref <200)
HDL Particle Number: 39.5 umol/L (ref >=30.5)
LDL Particle Number: 1309 nmol/L — ABNORMAL HIGH (ref <1000)
Large HDL-P: 7.6 umol/L (ref >=4.8)
Large VLDL-P: <0.8 nmol/L (ref <=2.7)
Small LDL Particle Number: 649 nmol/L — ABNORMAL HIGH (ref <=527)
Triglycerides: 72 mg/dL (ref <150)

## 2012-10-31 ENCOUNTER — Encounter (HOSPITAL_COMMUNITY): Payer: Self-pay

## 2012-10-31 ENCOUNTER — Emergency Department (HOSPITAL_COMMUNITY)
Admission: EM | Admit: 2012-10-31 | Discharge: 2012-10-31 | Disposition: A | Discharge status: Home / Self Care | Payer: Medicare Other | Attending: Emergency Medicine | Admitting: Emergency Medicine

## 2012-10-31 DIAGNOSIS — M549 Dorsalgia, unspecified: Secondary | ICD-10-CM

## 2012-10-31 DIAGNOSIS — Z9889 Other specified postprocedural states: Secondary | ICD-10-CM | POA: Insufficient documentation

## 2012-10-31 DIAGNOSIS — Z8709 Personal history of other diseases of the respiratory system: Secondary | ICD-10-CM | POA: Insufficient documentation

## 2012-10-31 DIAGNOSIS — Z880 Allergy status to penicillin: Secondary | ICD-10-CM | POA: Insufficient documentation

## 2012-10-31 DIAGNOSIS — F17200 Nicotine dependence, unspecified, uncomplicated: Secondary | ICD-10-CM | POA: Insufficient documentation

## 2012-10-31 DIAGNOSIS — M545 Low back pain, unspecified: Secondary | ICD-10-CM | POA: Insufficient documentation

## 2012-10-31 DIAGNOSIS — Z79899 Other long term (current) drug therapy: Secondary | ICD-10-CM | POA: Insufficient documentation

## 2012-10-31 DIAGNOSIS — K219 Gastro-esophageal reflux disease without esophagitis: Secondary | ICD-10-CM | POA: Insufficient documentation

## 2012-10-31 DIAGNOSIS — IMO0002 Reserved for concepts with insufficient information to code with codable children: Secondary | ICD-10-CM | POA: Insufficient documentation

## 2012-10-31 DIAGNOSIS — M47817 Spondylosis without myelopathy or radiculopathy, lumbosacral region: Secondary | ICD-10-CM | POA: Insufficient documentation

## 2012-10-31 DIAGNOSIS — I10 Essential (primary) hypertension: Secondary | ICD-10-CM | POA: Insufficient documentation

## 2012-10-31 DIAGNOSIS — F329 Major depressive disorder, single episode, unspecified: Secondary | ICD-10-CM | POA: Insufficient documentation

## 2012-10-31 DIAGNOSIS — F3289 Other specified depressive episodes: Secondary | ICD-10-CM | POA: Insufficient documentation

## 2012-10-31 DIAGNOSIS — M129 Arthropathy, unspecified: Secondary | ICD-10-CM | POA: Insufficient documentation

## 2012-10-31 DIAGNOSIS — Z7982 Long term (current) use of aspirin: Secondary | ICD-10-CM | POA: Insufficient documentation

## 2012-10-31 MED ORDER — PREDNISONE 20 MG PO TABS: 40.0000 mg | ORAL_TABLET | Freq: Every day | ORAL | Status: DC

## 2012-10-31 MED ORDER — HYDROMORPHONE HCL PF 1 MG/ML IJ SOLN
1.0000 mg | Freq: Once | INTRAMUSCULAR | Status: AC
  Administered 2012-10-31: 1 mg via INTRAMUSCULAR
  Filled 2012-10-31: qty 1

## 2012-10-31 MED ORDER — OXYCODONE-ACETAMINOPHEN 10-325 MG PO TABS
1.0000 | ORAL_TABLET | Freq: Four times a day (QID) | ORAL | Status: DC | PRN
Start: 2012-10-31 — End: 2013-03-13

## 2012-10-31 NOTE — ED Notes (Signed)
Pt c/o lower right back pain that radiates down right leg x2-3 weeks. Pt describes pain as sharp.

## 2012-10-31 NOTE — ED Notes (Signed)
Pt reports back pain for 2 weeks, denies any known injuries, has been to her surgeon and he was unable to fnd anything wrong --per pt.

## 2012-10-31 NOTE — ED Provider Notes (Signed)
History    This chart was scribed for [employer]. Rubin Payor, MD by Toya Smothers, ED Scribe. The patient was seen in room APA12/[facility]12. Patient's care was started at 1718.  CSN: [id number] Arrival date & time 10/31/12  1718  First MD Initiated Contact with Patient 10/31/12 1729     Chief Complaint  Patient presents with  . Back Pain    Patient is a 78 y.o. female presenting with back pain. The history is provided by the patient. No language interpreter was used.  Back Pain Associated symptoms: no abdominal pain, no chest pain and no headaches     HPI Comments: [name] is a 78 y.o. female with a h/o back pain, HTN, and fluid retention, who presents to the Emergency Department complaining of 3 weeks of recurrent, gradual onset, intermittent, lower lumbar pain. Pain is moderate burning, worse on the right, aggravated with certain position and when twisting, and mildly alleviated with imobilization. Pt denies injury and describes pain similar to that after lumbar surgery in 2010. Taking Hydrocodone 5-325 mg, Pt denotes moderate improvement to pain. She denies gaiting difficulty, incontinence, changes to bowels, headache, diaphoresis, fever, chills, nausea, vomiting, diarrhea, weakness, cough, SOB and any other pain. Pt is a current everyday smoker, denying alcohol and illicit drug use. Surgical Hx includes laminectomy/decompression 06/06/2012, Lumbar cyst removal 05/29/2012, and back surgery 07/21/2008.   Pt lists surgeon as Dr. Venetia Maxon    Past Medical History  Diagnosis Date  . Fluid retention   . Hypertension   . High cholesterol   . Depression   . Allergic rhinitis   . GERD (gastroesophageal reflux disease)   . Shortness of breath     with exertion  . Pelvic pain   . Arthritis     back  . History of blood transfusion    Past Surgical History  Procedure Laterality Date  . Cholecystectomy  1991  . Abdominal hysterectomy      Many years ago  . Tonsillectomy    . Appendectomy     . Back surgery  07/21/2008  . Rotator cuff repair  2011    right  . Cataract extraction w/phaco  04/16/2011    Procedure: CATARACT EXTRACTION PHACO AND INTRAOCULAR LENS PLACEMENT (IOC);  Surgeon: Susa Simmonds;  Location: [facility];  Service: Ophthalmology;  Laterality: Left;  CDE=11.35  . Hammer toe surgery  11/22/11    [facility], [name]  . Cataract extraction w/phaco  01/28/2012    Procedure: CATARACT EXTRACTION PHACO AND INTRAOCULAR LENS PLACEMENT (IOC);  Surgeon: Susa Simmonds, MD;  Location: [facility];  Service: Ophthalmology;  Laterality: Right;  CDI:8.15  . Cysto with hydrodistension  05/29/2012    Procedure: CYSTOSCOPY/HYDRODISTENSION;  Surgeon: Martina Sinner, MD;  Location: [facility];  Service: Urology;  Laterality: N/A;  INSTILLATION OF MARCAINE AND PYRIDIUM   . Eye surgery    . Lumbar laminectomy/decompression microdiscectomy Left 06/06/2012    Procedure: LUMBAR LAMINECTOMY/DECOMPRESSION MICRODISCECTOMY 1 LEVEL;  Surgeon: Maeola Harman, MD;  Location: [facility];  Service: Neurosurgery;  Laterality: Left;  Left Lumbar two-three Laminectomy for resection of synovial cyst   Family History  Problem Relation Age of Onset  . Colon cancer Neg Hx   . Anesthesia problems Neg Hx   . Hypotension Neg Hx   . Malignant hyperthermia Neg Hx   . Pseudochol deficiency Neg Hx   . Diabetes Other   . Allergies Other    History  Substance Use Topics  .  Smoking status: Current Some Day Smoker -- 0.10 packs/day for 40 years    Types: Cigarettes  . Smokeless tobacco: Never Used     Comment: smokes 2 cigarettes a day  . Alcohol Use: No   Review of Systems  Constitutional: Negative for appetite change and fatigue.  HENT: Negative for congestion, sinus pressure and ear discharge.   Eyes: Negative for discharge.  Respiratory: Negative for cough.   Cardiovascular: Negative for chest pain.  Gastrointestinal: Negative for abdominal pain and diarrhea.  Genitourinary: Negative for  frequency and hematuria.  Musculoskeletal: Positive for back pain. Negative for gait problem.  Skin: Negative for rash.  Neurological: Negative for seizures and headaches.  Psychiatric/Behavioral: Negative for hallucinations.    Allergies  Codeine; Lisinopril-hydrochlorothiazide; and Penicillins  Home Medications   Current Outpatient Rx  Name  Route  Sig  Dispense  Refill  . ALPRAZolam (XANAX) 0.5 MG tablet   Oral   Take 1 tablet (0.5 mg total) by mouth 2 (two) times daily.   60 tablet   0   . amitriptyline (ELAVIL) 25 MG tablet   Oral   Take 1 tablet (25 mg total) by mouth at bedtime.   30 tablet   3   . aspirin EC 81 MG tablet   Oral   Take 81 mg by mouth daily.           . budesonide-formoterol (SYMBICORT) 160-4.5 MCG/ACT inhaler   Inhalation   Inhale 1 puff into the lungs 2 (two) times daily.   1 Inhaler   3   . cetirizine (ZYRTEC) 10 MG tablet   Oral   Take 1 tablet (10 mg total) by mouth daily.   30 tablet   3   . cholecalciferol (VITAMIN D) 1000 UNITS tablet   Oral   Take 1,000 Units by mouth daily.         Marland Kitchen esomeprazole (NEXIUM) 40 MG capsule   Oral   Take 1 capsule (40 mg total) by mouth daily before breakfast.   30 capsule   3   . EXPIRED: fluticasone (FLONASE) 50 MCG/ACT nasal spray   Nasal   Place 2 sprays into the nose daily.   16 g   2   . furosemide (LASIX) 20 MG tablet   Oral   Take 10 mg by mouth daily.           Marland Kitchen HYDROcodone-acetaminophen (NORCO/VICODIN) 5-325 MG per tablet   Oral   Take 1-2 tablets by mouth every 4 (four) hours as needed for pain.   60 tablet   0   . losartan (COZAAR) 50 MG tablet   Oral   Take 1 tablet (50 mg total) by mouth daily.   30 tablet   3   . Multiple Vitamin (MULTIVITAMIN WITH MINERALS) TABS   Oral   Take 1 tablet by mouth daily.         . ondansetron (ZOFRAN) 4 MG tablet   Oral   Take 1 tablet (4 mg total) by mouth every 12 (twelve) hours as needed for nausea.   15 tablet   1    . oxyCODONE-acetaminophen (PERCOCET) 10-325 MG per tablet   Oral   Take 1 tablet by mouth every 4 (four) hours as needed. For pain         . oxyCODONE-acetaminophen (PERCOCET) 10-325 MG per tablet   Oral   Take 1 tablet by mouth every 6 (six) hours as needed for pain.   12 tablet  0   . predniSONE (DELTASONE) 20 MG tablet   Oral   Take 2 tablets (40 mg total) by mouth daily.   8 tablet   0   . raloxifene (EVISTA) 60 MG tablet   Oral   Take 1 tablet (60 mg total) by mouth daily.   90 tablet   0   . ranitidine (ZANTAC) 150 MG tablet   Oral   Take 150 mg by mouth daily.          . simvastatin (ZOCOR) 10 MG tablet   Oral   Take 1 tablet (10 mg total) by mouth at bedtime.   30 tablet   3    BP 121/58  Pulse 68  Temp(Src) 98 F (36.7 C) (Oral)  Resp 20  Ht 5\' 2"  (1.575 m)  Wt 144 lb (65.318 kg)  BMI 26.33 kg/m2  SpO2 100%  Physical Exam  Nursing note and vitals reviewed. Constitutional: She is oriented to person, place, and time. She appears well-developed and well-nourished. No distress.  HENT:  Head: Normocephalic and atraumatic.  Eyes: EOM are normal.  Neck: Neck supple. No tracheal deviation present.  Cardiovascular: Normal rate.   Pulmonary/Chest: Effort normal. No respiratory distress.  Abdominal: She exhibits no mass. There is no tenderness.  Musculoskeletal: Normal range of motion.  SI tenderness to palpation Painwith right leg raise  Neurological: She is alert and oriented to person, place, and time. No cranial nerve deficit.  Sensation is intact below the right patella  Skin: Skin is warm and dry. No rash noted.  Psychiatric: She has a normal mood and affect. Her behavior is normal.    ED Course  Procedures  DIAGNOSTIC STUDIES:  COORDINATION OF CARE: 17:32   Labs Reviewed - No data to display No results found. 1. Back pain     MDM  Patient presents with right-sided back pain that radiates to the leg. She has pain with bilateral  straight leg raise. Sensation is intact. She has normal reflexes on the right lower extremity. She's taking her hydrocodone that she got from Dr. Venetia Maxon without relief. No red flags for the back pain. We'll give Percocet tens and some steroids. She will follow with neurosurgery.      I personally performed the services described in this documentation, which was scribed in my presence. The recorded information has been reviewed and is accurate.     Juliet Rude. Rubin Payor, MD 10/31/12 [phone]

## 2012-11-03 ENCOUNTER — Other Ambulatory Visit: Payer: Self-pay | Admitting: Neurosurgery

## 2012-11-03 DIAGNOSIS — M169 Osteoarthritis of hip, unspecified: Secondary | ICD-10-CM

## 2012-11-05 ENCOUNTER — Ambulatory Visit
Admission: RE | Admit: 2012-11-05 | Discharge: 2012-11-05 | Disposition: A | Discharge status: Home / Self Care | Payer: Medicare Other | Source: Ambulatory Visit | Attending: Neurosurgery | Admitting: Neurosurgery

## 2012-11-05 DIAGNOSIS — M169 Osteoarthritis of hip, unspecified: Secondary | ICD-10-CM

## 2012-12-01 ENCOUNTER — Other Ambulatory Visit: Payer: Self-pay | Admitting: Orthopedic Surgery

## 2012-12-01 DIAGNOSIS — M545 Low back pain: Secondary | ICD-10-CM

## 2012-12-01 DIAGNOSIS — M79604 Pain in right leg: Secondary | ICD-10-CM

## 2012-12-03 ENCOUNTER — Ambulatory Visit
Admission: RE | Admit: 2012-12-03 | Discharge: 2012-12-03 | Disposition: A | Discharge status: Home / Self Care | Payer: Medicare Other | Source: Ambulatory Visit | Attending: Orthopedic Surgery | Admitting: Orthopedic Surgery

## 2012-12-03 DIAGNOSIS — M545 Low back pain: Secondary | ICD-10-CM

## 2012-12-03 DIAGNOSIS — M79604 Pain in right leg: Secondary | ICD-10-CM

## 2012-12-03 MED ORDER — GADOBENATE DIMEGLUMINE 529 MG/ML IV SOLN
13.0000 mL | Freq: Once | INTRAVENOUS | Status: AC | PRN
  Administered 2012-12-03: 13 mL via INTRAVENOUS

## 2012-12-25 ENCOUNTER — Encounter: Payer: Self-pay | Admitting: Family Medicine

## 2012-12-25 ENCOUNTER — Ambulatory Visit (INDEPENDENT_AMBULATORY_CARE_PROVIDER_SITE_OTHER): Payer: Medicare Other | Admitting: Family Medicine

## 2012-12-25 VITALS — BP 111/61 | HR 88 | Temp 97.2°F | Wt 143.0 lb

## 2012-12-25 DIAGNOSIS — J209 Acute bronchitis, unspecified: Secondary | ICD-10-CM

## 2012-12-25 MED ORDER — AZITHROMYCIN 250 MG PO TABS: ORAL_TABLET | ORAL | Status: DC

## 2012-12-25 NOTE — Progress Notes (Signed)
  Subjective:    Patient ID: [name], female    DOB: [birth year], 78 y.o.   MRN: [id number]  HPI This 78 y.o. female presents for evaluation of cough and congestion.  She has  Been having some night time coughing spells.  She has been having some Congestion and cough.   Review of Systems C/o cough and congestion. No chest pain, SOB, HA, dizziness, vision change, N/V, diarrhea, constipation, dysuria, urinary urgency or frequency, myalgias, arthralgias or rash.     Objective:   Physical Exam Vital signs noted  Well developed well nourished female.  HEENT - Head atraumatic Normocephalic                Eyes - PERRLA, Conjuctiva - clear Sclera- Clear EOMI                Ears - EAC's Wnl TM's Wnl Gross Hearing WNL                Nose - Nares patent                 Throat - oropharanx wnl Respiratory - Lungs CTA bilateral Cardiac - RRR S1 and S2 without murmur GI - Abdomen soft Nontender and bowel sounds active x 4 Extremities - No edema. Neuro - Grossly intact.       Assessment & Plan:  Acute bronchitis - Plan: azithromycin (ZITHROMAX) 250 MG tablet

## 2012-12-25 NOTE — Patient Instructions (Addendum)

## 2012-12-29 ENCOUNTER — Emergency Department (HOSPITAL_COMMUNITY)
Admission: EM | Admit: 2012-12-29 | Discharge: 2012-12-29 | Disposition: A | Discharge status: Home / Self Care | Payer: Medicare Other | Attending: Emergency Medicine | Admitting: Emergency Medicine

## 2012-12-29 ENCOUNTER — Encounter (HOSPITAL_COMMUNITY): Payer: Self-pay | Admitting: Emergency Medicine

## 2012-12-29 DIAGNOSIS — Z8742 Personal history of other diseases of the female genital tract: Secondary | ICD-10-CM | POA: Insufficient documentation

## 2012-12-29 DIAGNOSIS — Z79899 Other long term (current) drug therapy: Secondary | ICD-10-CM | POA: Insufficient documentation

## 2012-12-29 DIAGNOSIS — Z8709 Personal history of other diseases of the respiratory system: Secondary | ICD-10-CM | POA: Insufficient documentation

## 2012-12-29 DIAGNOSIS — F17200 Nicotine dependence, unspecified, uncomplicated: Secondary | ICD-10-CM | POA: Insufficient documentation

## 2012-12-29 DIAGNOSIS — I10 Essential (primary) hypertension: Secondary | ICD-10-CM | POA: Insufficient documentation

## 2012-12-29 DIAGNOSIS — Z9089 Acquired absence of other organs: Secondary | ICD-10-CM | POA: Insufficient documentation

## 2012-12-29 DIAGNOSIS — F3289 Other specified depressive episodes: Secondary | ICD-10-CM | POA: Insufficient documentation

## 2012-12-29 DIAGNOSIS — R1033 Periumbilical pain: Secondary | ICD-10-CM

## 2012-12-29 DIAGNOSIS — Z7982 Long term (current) use of aspirin: Secondary | ICD-10-CM | POA: Insufficient documentation

## 2012-12-29 DIAGNOSIS — Z880 Allergy status to penicillin: Secondary | ICD-10-CM | POA: Insufficient documentation

## 2012-12-29 DIAGNOSIS — K219 Gastro-esophageal reflux disease without esophagitis: Secondary | ICD-10-CM | POA: Insufficient documentation

## 2012-12-29 DIAGNOSIS — E7800 Pure hypercholesterolemia, unspecified: Secondary | ICD-10-CM | POA: Insufficient documentation

## 2012-12-29 DIAGNOSIS — F329 Major depressive disorder, single episode, unspecified: Secondary | ICD-10-CM | POA: Insufficient documentation

## 2012-12-29 DIAGNOSIS — M129 Arthropathy, unspecified: Secondary | ICD-10-CM | POA: Insufficient documentation

## 2012-12-29 DIAGNOSIS — Z90710 Acquired absence of both cervix and uterus: Secondary | ICD-10-CM | POA: Insufficient documentation

## 2012-12-29 DIAGNOSIS — R112 Nausea with vomiting, unspecified: Secondary | ICD-10-CM

## 2012-12-29 LAB — COMPREHENSIVE METABOLIC PANEL
ALT: 16 U/L (ref 0–35)
Alkaline Phosphatase: 94 U/L (ref 39–117)
BUN: 13 mg/dL (ref 6–23)
CO2: 26 mEq/L (ref 19–32)
Calcium: 9.7 mg/dL (ref 8.4–10.5)
GFR calc Af Amer: 70 mL/min — ABNORMAL LOW (ref >90)
GFR calc non Af Amer: 60 mL/min — ABNORMAL LOW (ref >90)
Glucose, Bld: 106 mg/dL — ABNORMAL HIGH (ref 70–99)
Sodium: 138 mEq/L (ref 135–145)
Total Protein: 7.6 g/dL (ref 6.0–8.3)

## 2012-12-29 LAB — CBC WITH DIFFERENTIAL/PLATELET
Eosinophils Absolute: 0.1 10*3/uL (ref 0.0–0.7)
Eosinophils Relative: 1 % (ref 0–5)
HCT: 40.2 % (ref 36.0–46.0)
Hemoglobin: 14 g/dL (ref 12.0–15.0)
Lymphocytes Relative: 21 % (ref 12–46)
Lymphs Abs: 2.3 10*3/uL (ref 0.7–4.0)
MCH: 30.8 pg (ref 26.0–34.0)
MCV: 88.5 fL (ref 78.0–100.0)
Monocytes Relative: 4 % (ref 3–12)
Platelets: 383 10*3/uL (ref 150–400)
RBC: 4.54 MIL/uL (ref 3.87–5.11)
WBC: 11.2 10*3/uL — ABNORMAL HIGH (ref 4.0–10.5)

## 2012-12-29 LAB — LIPASE, BLOOD: Lipase: 25 U/L (ref 11–59)

## 2012-12-29 MED ORDER — ONDANSETRON HCL 4 MG PO TABS: 4.0000 mg | ORAL_TABLET | Freq: Four times a day (QID) | ORAL | Status: DC | PRN

## 2012-12-29 MED ORDER — SODIUM CHLORIDE 0.9 % IV SOLN
Freq: Once | INTRAVENOUS | Status: AC
  Administered 2012-12-29: 03:00:00 via INTRAVENOUS

## 2012-12-29 MED ORDER — ONDANSETRON HCL 4 MG/2ML IJ SOLN
4.0000 mg | Freq: Once | INTRAMUSCULAR | Status: AC
  Administered 2012-12-29: 4 mg via INTRAVENOUS
  Filled 2012-12-29: qty 2

## 2012-12-29 NOTE — ED Notes (Signed)
Pt resting quietly, respirations regular, even and unlabored.

## 2012-12-29 NOTE — ED Provider Notes (Signed)
CSN: [id number]     Arrival date & time 12/29/12  0055 History   First MD Initiated Contact with Patient 12/29/12 0240     Chief Complaint  Patient presents with  . Abdominal Pain  . Emesis   (Consider location/radiation/quality/duration/timing/severity/associated sxs/prior Treatment) Patient is a 78 y.o. female presenting with abdominal pain and vomiting. The history is provided by the patient.  Abdominal Pain Associated symptoms: vomiting   Emesis Associated symptoms: abdominal pain   She had onset about 9 PM of crampy periumbilical pain with associated nausea and vomiting. Pain improves somewhat after vomiting. She denies fever, chills, sweats. She denies diarrhea. She denies radiation of pain. When present, pain is severe and she rates it at 10/10. She is currently without pain. Of note, she was at a cookout earlier today and she wonders whether she might have food poisoning. It is not known if anybody else went to a cookout has gotten sick. She denies any sick contacts. Nothing makes her symptoms worse. Only vomiting there is slight improvement of her pain.  Past Medical History  Diagnosis Date  . Fluid retention   . Hypertension   . High cholesterol   . Depression   . Allergic rhinitis   . GERD (gastroesophageal reflux disease)   . Shortness of breath     with exertion  . Pelvic pain   . Arthritis     back  . History of blood transfusion    Past Surgical History  Procedure Laterality Date  . Cholecystectomy  1991  . Abdominal hysterectomy      Many years ago  . Tonsillectomy    . Appendectomy    . Back surgery  07/21/2008  . Rotator cuff repair  2011    right  . Cataract extraction w/phaco  04/16/2011    Procedure: CATARACT EXTRACTION PHACO AND INTRAOCULAR LENS PLACEMENT (IOC);  Surgeon: Susa Simmonds;  Location: [facility];  Service: Ophthalmology;  Laterality: Left;  CDE=11.35  . Hammer toe surgery  11/22/11    [facility], [name]  . Cataract extraction w/phaco  01/28/2012   Procedure: CATARACT EXTRACTION PHACO AND INTRAOCULAR LENS PLACEMENT (IOC);  Surgeon: Susa Simmonds, MD;  Location: [facility];  Service: Ophthalmology;  Laterality: Right;  CDI:8.15  . Cysto with hydrodistension  05/29/2012    Procedure: CYSTOSCOPY/HYDRODISTENSION;  Surgeon: Martina Sinner, MD;  Location: [facility];  Service: Urology;  Laterality: N/A;  INSTILLATION OF MARCAINE AND PYRIDIUM   . Eye surgery    . Lumbar laminectomy/decompression microdiscectomy Left 06/06/2012    Procedure: LUMBAR LAMINECTOMY/DECOMPRESSION MICRODISCECTOMY 1 LEVEL;  Surgeon: Maeola Harman, MD;  Location: [facility];  Service: Neurosurgery;  Laterality: Left;  Left Lumbar two-three Laminectomy for resection of synovial cyst   Family History  Problem Relation Age of Onset  . Colon cancer Neg Hx   . Anesthesia problems Neg Hx   . Hypotension Neg Hx   . Malignant hyperthermia Neg Hx   . Pseudochol deficiency Neg Hx   . Diabetes Other   . Allergies Other    History  Substance Use Topics  . Smoking status: Current Some Day Smoker -- 0.10 packs/day for 40 years    Types: Cigarettes  . Smokeless tobacco: Never Used     Comment: smokes 2 cigarettes a day  . Alcohol Use: No   OB History   Grav Para Term Preterm Abortions TAB SAB Ect Mult Living  Review of Systems  Gastrointestinal: Positive for vomiting and abdominal pain.  All other systems reviewed and are negative.    Allergies  Codeine; Lisinopril-hydrochlorothiazide; and Penicillins  Home Medications   Current Outpatient Rx  Name  Route  Sig  Dispense  Refill  . ALPRAZolam (XANAX) 0.5 MG tablet   Oral   Take 1 tablet (0.5 mg total) by mouth 2 (two) times daily.   60 tablet   0   . amitriptyline (ELAVIL) 25 MG tablet   Oral   Take 1 tablet (25 mg total) by mouth at bedtime.   30 tablet   3   . aspirin EC 81 MG tablet   Oral   Take 81 mg by mouth daily.           Marland Kitchen azithromycin (ZITHROMAX) 250 MG  tablet      Take 2 po day 1 and then one po qdx 4 days   6 tablet   1   . budesonide-formoterol (SYMBICORT) 160-4.5 MCG/ACT inhaler   Inhalation   Inhale 1 puff into the lungs 2 (two) times daily.   1 Inhaler   3   . cetirizine (ZYRTEC) 10 MG tablet   Oral   Take 1 tablet (10 mg total) by mouth daily.   30 tablet   3   . cholecalciferol (VITAMIN D) 1000 UNITS tablet   Oral   Take 1,000 Units by mouth daily.         Marland Kitchen esomeprazole (NEXIUM) 40 MG capsule   Oral   Take 1 capsule (40 mg total) by mouth daily before breakfast.   30 capsule   3   . EXPIRED: fluticasone (FLONASE) 50 MCG/ACT nasal spray   Nasal   Place 2 sprays into the nose daily as needed for rhinitis or allergies.         . furosemide (LASIX) 20 MG tablet   Oral   Take 10 mg by mouth daily.           Marland Kitchen HYDROcodone-acetaminophen (NORCO/VICODIN) 5-325 MG per tablet   Oral   Take 1-2 tablets by mouth every 4 (four) hours as needed for pain.   60 tablet   0   . losartan (COZAAR) 50 MG tablet   Oral   Take 1 tablet (50 mg total) by mouth daily.   30 tablet   3   . Multiple Vitamin (MULTIVITAMIN WITH MINERALS) TABS   Oral   Take 1 tablet by mouth every morning.          . ondansetron (ZOFRAN) 4 MG tablet   Oral   Take 4 mg by mouth every 12 (twelve) hours as needed for nausea.         Marland Kitchen oxyCODONE-acetaminophen (PERCOCET) 10-325 MG per tablet   Oral   Take 1 tablet by mouth every 6 (six) hours as needed for pain.   12 tablet   0   . predniSONE (DELTASONE) 20 MG tablet   Oral   Take 2 tablets (40 mg total) by mouth daily.   8 tablet   0   . raloxifene (EVISTA) 60 MG tablet   Oral   Take 1 tablet (60 mg total) by mouth daily.   90 tablet   0   . simvastatin (ZOCOR) 10 MG tablet   Oral   Take 1 tablet (10 mg total) by mouth at bedtime.   30 tablet   3    BP 137/62  Pulse 74  Temp(Src) 98.5  F (36.9 C) (Oral)  Resp 28  Ht 5\' 4"  (1.626 m)  Wt 144 lb (65.318 kg)  BMI  24.71 kg/m2  SpO2 100% Physical Exam  Nursing note and vitals reviewed.  78 year old female, resting comfortably and in no acute distress. Vital signs are significant for tachypnea with respiratory rate of 28. Oxygen saturation is 100%, which is normal. Head is normocephalic and atraumatic. PERRLA, EOMI. Oropharynx is clear. Neck is nontender and supple without adenopathy or JVD. Back is nontender and there is no CVA tenderness. Lungs are clear without rales, wheezes, or rhonchi. Chest is nontender. Heart has regular rate and rhythm without murmur. Abdomen is soft, flat, nontender without masses or hepatosplenomegaly and peristalsis is normoactive. Extremities have no cyanosis or edema, full range of motion is present. Skin is warm and dry without rash. Neurologic: Mental status is normal, cranial nerves are intact, there are no motor or sensory deficits.  ED Course  Procedures (including critical care time) Labs Review Results for orders placed during the hospital encounter of 12/29/12  CBC WITH DIFFERENTIAL      Result Value Range   WBC 11.2 (*) 4.0 - 10.5 K/uL   RBC 4.54  3.87 - 5.11 MIL/uL   Hemoglobin 14.0  12.0 - 15.0 g/dL   HCT 16.1  09.6 - 04.5 %   MCV 88.5  78.0 - 100.0 fL   MCH 30.8  26.0 - 34.0 pg   MCHC 34.8  30.0 - 36.0 g/dL   RDW 40.9  81.1 - 91.4 %   Platelets 383  150 - 400 K/uL   Neutrophils Relative % 74  43 - 77 %   Neutro Abs 8.3 (*) 1.7 - 7.7 K/uL   Lymphocytes Relative 21  12 - 46 %   Lymphs Abs 2.3  0.7 - 4.0 K/uL   Monocytes Relative 4  3 - 12 %   Monocytes Absolute 0.5  0.1 - 1.0 K/uL   Eosinophils Relative 1  0 - 5 %   Eosinophils Absolute 0.1  0.0 - 0.7 K/uL   Basophils Relative 0  0 - 1 %   Basophils Absolute 0.0  0.0 - 0.1 K/uL  COMPREHENSIVE METABOLIC PANEL      Result Value Range   Sodium 138  135 - 145 mEq/L   Potassium 3.5  3.5 - 5.1 mEq/L   Chloride 101  96 - 112 mEq/L   CO2 26  19 - 32 mEq/L   Glucose, Bld 106 (*) 70 - 99 mg/dL   BUN  13  6 - 23 mg/dL   Creatinine, Ser 7.82  0.50 - 1.10 mg/dL   Calcium 9.7  8.4 - 95.6 mg/dL   Total Protein 7.6  6.0 - 8.3 g/dL   Albumin 3.8  3.5 - 5.2 g/dL   AST 21  0 - 37 U/L   ALT 16  0 - 35 U/L   Alkaline Phosphatase 94  39 - 117 U/L   Total Bilirubin 0.3  0.3 - 1.2 mg/dL   GFR calc non Af Amer 60 (*) >90 mL/min   GFR calc Af Amer 70 (*) >90 mL/min  LIPASE, BLOOD      Result Value Range   Lipase 25  11 - 59 U/L    MDM  No diagnosis found. Crampy abdominal pain with vomiting. This could be a viral gastritis. I doubt food poisoning without evidence of other people being sick and without diarrhea. Her exam is currently benign. Screening labs  will be obtained and she'll be given ondansetron for nausea.  She feels much better after above noted that treatment. She is resting comfortably and in no acute distress. She says her pain has improved even though she has not received any analgesic medication. She is discharged with prescription for ondansetron.  Dione Booze, MD 12/29/12 [phone]

## 2012-12-29 NOTE — ED Notes (Signed)
Patient states she went to a cookout earlier today and tonight began having sharp abdominal pain and vomiting.

## 2012-12-29 NOTE — ED Notes (Signed)
Pt reporting abdominal cramping and nausea beginning about 9:30 tonight.  No vomiting at present time.

## 2013-01-21 ENCOUNTER — Other Ambulatory Visit: Payer: Self-pay | Admitting: Nurse Practitioner

## 2013-01-22 ENCOUNTER — Other Ambulatory Visit: Payer: Self-pay | Admitting: Nurse Practitioner

## 2013-01-23 NOTE — Telephone Encounter (Signed)
Please find out why patient still needs medication, she may need to be seen by specialist.

## 2013-01-23 NOTE — Telephone Encounter (Signed)
Original order written by Dr Preston Fleeting on 12-29-12. Please advise

## 2013-01-26 ENCOUNTER — Ambulatory Visit (INDEPENDENT_AMBULATORY_CARE_PROVIDER_SITE_OTHER): Payer: Medicare Other | Admitting: General Practice

## 2013-01-26 ENCOUNTER — Encounter: Payer: Self-pay | Admitting: General Practice

## 2013-01-26 VITALS — BP 165/73 | HR 67 | Temp 97.7°F | Ht 64.0 in | Wt 140.5 lb

## 2013-01-26 DIAGNOSIS — Z7189 Other specified counseling: Secondary | ICD-10-CM

## 2013-01-26 DIAGNOSIS — J45909 Unspecified asthma, uncomplicated: Secondary | ICD-10-CM

## 2013-01-26 DIAGNOSIS — F411 Generalized anxiety disorder: Secondary | ICD-10-CM

## 2013-01-26 DIAGNOSIS — G4700 Insomnia, unspecified: Secondary | ICD-10-CM

## 2013-01-26 DIAGNOSIS — K219 Gastro-esophageal reflux disease without esophagitis: Secondary | ICD-10-CM

## 2013-01-26 DIAGNOSIS — Z716 Tobacco abuse counseling: Secondary | ICD-10-CM

## 2013-01-26 DIAGNOSIS — Z09 Encounter for follow-up examination after completed treatment for conditions other than malignant neoplasm: Secondary | ICD-10-CM

## 2013-01-26 DIAGNOSIS — E785 Hyperlipidemia, unspecified: Secondary | ICD-10-CM

## 2013-01-26 DIAGNOSIS — J4530 Mild persistent asthma, uncomplicated: Secondary | ICD-10-CM

## 2013-01-26 DIAGNOSIS — J309 Allergic rhinitis, unspecified: Secondary | ICD-10-CM

## 2013-01-26 DIAGNOSIS — I10 Essential (primary) hypertension: Secondary | ICD-10-CM

## 2013-01-26 LAB — POCT CBC
Granulocyte percent: 65 %G (ref 37–80)
HCT, POC: 43.3 % (ref 37.7–47.9)
MCHC: 33.1 g/dL (ref 31.8–35.4)
MPV: 7.6 fL (ref 0–99.8)
POC Granulocyte: 6 (ref 2–6.9)
POC LYMPH PERCENT: 30.8 %L (ref 10–50)
Platelet Count, POC: 359 10*3/uL (ref 142–424)
RDW, POC: 14.8 %
WBC: 9.2 10*3/uL (ref 4.6–10.2)

## 2013-01-26 MED ORDER — ESOMEPRAZOLE MAGNESIUM 40 MG PO CPDR: 40.0000 mg | DELAYED_RELEASE_CAPSULE | Freq: Every day | ORAL | Status: DC

## 2013-01-26 MED ORDER — CETIRIZINE HCL 10 MG PO TABS: 10.0000 mg | ORAL_TABLET | Freq: Every day | ORAL | Status: DC

## 2013-01-26 MED ORDER — BUPROPION HCL ER (SR) 150 MG PO TB12: ORAL_TABLET | ORAL | Status: DC

## 2013-01-26 MED ORDER — SIMVASTATIN 10 MG PO TABS: 10.0000 mg | ORAL_TABLET | Freq: Every day | ORAL | Status: DC

## 2013-01-26 MED ORDER — ALPRAZOLAM 0.5 MG PO TABS: 0.5000 mg | ORAL_TABLET | Freq: Two times a day (BID) | ORAL | Status: DC

## 2013-01-26 MED ORDER — LOSARTAN POTASSIUM 50 MG PO TABS: 50.0000 mg | ORAL_TABLET | Freq: Every day | ORAL | Status: DC

## 2013-01-26 MED ORDER — RALOXIFENE HCL 60 MG PO TABS: 60.0000 mg | ORAL_TABLET | Freq: Every day | ORAL | Status: DC

## 2013-01-26 MED ORDER — BUDESONIDE-FORMOTEROL FUMARATE 160-4.5 MCG/ACT IN AERO: 1.0000 | INHALATION_SPRAY | Freq: Two times a day (BID) | RESPIRATORY_TRACT | Status: DC

## 2013-01-26 MED ORDER — AMITRIPTYLINE HCL 25 MG PO TABS: 25.0000 mg | ORAL_TABLET | Freq: Every day | ORAL | Status: DC

## 2013-01-26 NOTE — Progress Notes (Signed)
Subjective:    Patient ID: [name], female    DOB: 1934/12/03, 78 y.o.   MRN: [id number]  HPI Patient presents today for 3 month follow up. She has a history of hypertension, anxiety, insomnia, seasonal allergies, gerd, hyperlipidemia, osteoporosis, and smoking. Reports checking blood pressure periodically at home, but denies keeping diary. Reports being seen by ortho specialist for right hip pain, which she received an injection on Sept. 23. Reports hip pain has decreased. Reports trying to eat healthier. Reports taking medications as prescribed and anxiety/insomnia is well controlled. Reports a desire to stop smoking.    Review of Systems  Constitutional: Negative for fever and chills.  HENT: Negative for ear pain, neck pain and neck stiffness.   Respiratory: Negative for chest tightness and shortness of breath.   Cardiovascular: Negative for chest pain and palpitations.  Gastrointestinal: Negative for nausea, vomiting, abdominal pain, diarrhea and blood in stool.  Genitourinary: Negative for hematuria and difficulty urinating.  Musculoskeletal: Negative for back pain.       Right hip pain periodically  Neurological: Negative for dizziness, weakness and headaches.       Objective:   Physical Exam  Constitutional: She is oriented to person, place, and time. She appears well-developed and well-nourished.  HENT:  Head: Normocephalic and atraumatic.  Right Ear: External ear normal.  Left Ear: External ear normal.  Nose: Nose normal.  Mouth/Throat: Oropharynx is clear and moist.  Eyes: EOM are normal. Pupils are equal, round, and reactive to light.  Neck: Normal range of motion. Neck supple. No thyromegaly present.  Cardiovascular: Normal rate, regular rhythm and normal heart sounds.   Pulmonary/Chest: Effort normal and breath sounds normal. No respiratory distress. She exhibits no tenderness.  Abdominal: Soft. Bowel sounds are normal. She exhibits no distension. There is no  tenderness.  Musculoskeletal: She exhibits no edema and no tenderness.  Lymphadenopathy:    She has no cervical adenopathy.  Neurological: She is alert and oriented to person, place, and time.  Skin: Skin is warm and dry.  Psychiatric: She has a normal mood and affect.          Assessment & Plan:  1. Other and unspecified hyperlipidemia  - NMR, lipoprofile - simvastatin (ZOCOR) 10 MG tablet; Take 1 tablet (10 mg total) by mouth at bedtime.  Dispense: 30 tablet; Refill: 3  2. Hypertension  - CMP14+EGFR  3. Follow-up exam, 3-6 months since previous exam  - POCT CBC  4. Anxiety state, unspecified  - ALPRAZolam (XANAX) 0.5 MG tablet; Take 1 tablet (0.5 mg total) by mouth 2 (two) times daily.  Dispense: 60 tablet; Refill: 0  5. Insomnia  - amitriptyline (ELAVIL) 25 MG tablet; Take 1 tablet (25 mg total) by mouth at bedtime.  Dispense: 30 tablet; Refill: 3  6. Asthma, chronic, mild persistent, uncomplicated  - budesonide-formoterol (SYMBICORT) 160-4.5 MCG/ACT inhaler; Inhale 1 puff into the lungs 2 (two) times daily.  Dispense: 1 Inhaler; Refill: 3  7. Allergic rhinitis  - cetirizine (ZYRTEC) 10 MG tablet; Take 1 tablet (10 mg total) by mouth daily.  Dispense: 30 tablet; Refill: 3  8. GERD (gastroesophageal reflux disease)  - esomeprazole (NEXIUM) 40 MG capsule; Take 1 capsule (40 mg total) by mouth daily before breakfast.  Dispense: 30 capsule; Refill: 3  9. Essential hypertension, benign  - losartan (COZAAR) 50 MG tablet; Take 1 tablet (50 mg total) by mouth daily.  Dispense: 30 tablet; Refill: 3  10. Encounter for smoking cessation counseling - buPROPion (  WELLBUTRIN SR) 150 MG 12 hr tablet; Start by taking one tablet (150mg ) daily for three days, then one tablet twice daily.  Dispense: 60 tablet; Refill: 0 -stop smoking after 5-7 days of treatment -disccused side effects of medication and medication is taking 7-12 weeks Continue all current medications Labs  pending F/u in 3 months Discussed exercise and diet  Patient verbalized understanding Coralie Keens, FNP-C

## 2013-01-26 NOTE — Patient Instructions (Addendum)
Smoking Cessation Quitting smoking is important to your health and has many advantages. However, it is not always easy to quit since nicotine is a very addictive drug. Often times, people try 3 times or more before being able to quit. This document explains the best ways for you to prepare to quit smoking. Quitting takes hard work and a lot of effort, but you can do it. ADVANTAGES OF QUITTING SMOKING  You will live longer, feel better, and live better.  Your body will feel the impact of quitting smoking almost immediately.  Within 20 minutes, blood pressure decreases. Your pulse returns to its normal level.  After 8 hours, carbon monoxide levels in the blood return to normal. Your oxygen level increases.  After 24 hours, the chance of having a heart attack starts to decrease. Your breath, hair, and body stop smelling like smoke.  After 48 hours, damaged nerve endings begin to recover. Your sense of taste and smell improve.  After 72 hours, the body is virtually free of nicotine. Your bronchial tubes relax and breathing becomes easier.  After 2 to 12 weeks, lungs can hold more air. Exercise becomes easier and circulation improves.  The risk of having a heart attack, stroke, cancer, or lung disease is greatly reduced.  After 1 year, the risk of coronary heart disease is cut in half.  After 5 years, the risk of stroke falls to the same as a nonsmoker.  After 10 years, the risk of lung cancer is cut in half and the risk of other cancers decreases significantly.  After 15 years, the risk of coronary heart disease drops, usually to the level of a nonsmoker.  If you are pregnant, quitting smoking will improve your chances of having a healthy baby.  The people you live with, especially any children, will be healthier.  You will have extra money to spend on things other than cigarettes. QUESTIONS TO THINK ABOUT BEFORE ATTEMPTING TO QUIT You may want to talk about your answers with your  caregiver.  Why do you want to quit?  If you tried to quit in the past, what helped and what did not?  What will be the most difficult situations for you after you quit? How will you plan to handle them?  Who can help you through the tough times? Your family? Friends? A caregiver?  What pleasures do you get from smoking? What ways can you still get pleasure if you quit? Here are some questions to ask your caregiver:  How can you help me to be successful at quitting?  What medicine do you think would be best for me and how should I take it?  What should I do if I need more help?  What is smoking withdrawal like? How can I get information on withdrawal? GET READY  Set a quit date.  Change your environment by getting rid of all cigarettes, ashtrays, matches, and lighters in your home, car, or work. Do not let people smoke in your home.  Review your past attempts to quit. Think about what worked and what did not. GET SUPPORT AND ENCOURAGEMENT You have a better chance of being successful if you have help. You can get support in many ways.  Tell your family, friends, and co-workers that you are going to quit and need their support. Ask them not to smoke around you.  Get individual, group, or telephone counseling and support. Programs are available at local hospitals and health centers. Call your local health department for   information about programs in your area.  Spiritual beliefs and practices may help some smokers quit.  Download a "quit meter" on your computer to keep track of quit statistics, such as how long you have gone without smoking, cigarettes not smoked, and money saved.  Get a self-help book about quitting smoking and staying off of tobacco. LEARN NEW SKILLS AND BEHAVIORS  Distract yourself from urges to smoke. Talk to someone, go for a walk, or occupy your time with a task.  Change your normal routine. Take a different route to work. Drink tea instead of coffee.  Eat breakfast in a different place.  Reduce your stress. Take a hot bath, exercise, or read a book.  Plan something enjoyable to do every day. Reward yourself for not smoking.  Explore interactive web-based programs that specialize in helping you quit. GET MEDICINE AND USE IT CORRECTLY Medicines can help you stop smoking and decrease the urge to smoke. Combining medicine with the above behavioral methods and support can greatly increase your chances of successfully quitting smoking.  Nicotine replacement therapy helps deliver nicotine to your body without the negative effects and risks of smoking. Nicotine replacement therapy includes nicotine gum, lozenges, inhalers, nasal sprays, and skin patches. Some may be available over-the-counter and others require a prescription.  Antidepressant medicine helps people abstain from smoking, but how this works is unknown. This medicine is available by prescription.  Nicotinic receptor partial agonist medicine simulates the effect of nicotine in your brain. This medicine is available by prescription. Ask your caregiver for advice about which medicines to use and how to use them based on your health history. Your caregiver will tell you what side effects to look out for if you choose to be on a medicine or therapy. Carefully read the information on the package. Do not use any other product containing nicotine while using a nicotine replacement product.  RELAPSE OR DIFFICULT SITUATIONS Most relapses occur within the first 3 months after quitting. Do not be discouraged if you start smoking again. Remember, most people try several times before finally quitting. You may have symptoms of withdrawal because your body is used to nicotine. You may crave cigarettes, be irritable, feel very hungry, cough often, get headaches, or have difficulty concentrating. The withdrawal symptoms are only temporary. They are strongest when you first quit, but they will go away within  10 14 days. To reduce the chances of relapse, try to:  Avoid drinking alcohol. Drinking lowers your chances of successfully quitting.  Reduce the amount of caffeine you consume. Once you quit smoking, the amount of caffeine in your body increases and can give you symptoms, such as a rapid heartbeat, sweating, and anxiety.  Avoid smokers because they can make you want to smoke.  Do not let weight gain distract you. Many smokers will gain weight when they quit, usually less than 10 pounds. Eat a healthy diet and stay active. You can always lose the weight gained after you quit.  Find ways to improve your mood other than smoking. FOR MORE INFORMATION  www.smokefree.gov  Document Released: 04/10/2001 Document Revised: 10/16/2011 Document Reviewed: 07/26/2011 [employer] Patient Information 2014 [employer].  

## 2013-01-27 LAB — CMP14+EGFR
ALT: 17 IU/L (ref 0–32)
AST: 18 IU/L (ref 0–40)
Albumin: 4.2 g/dL (ref 3.5–4.8)
Alkaline Phosphatase: 97 IU/L (ref 39–117)
BUN: 12 mg/dL (ref 8–27)
CO2: 26 mmol/L (ref 18–29)
Calcium: 9.3 mg/dL (ref 8.6–10.2)
Chloride: 106 mmol/L (ref 97–108)
GFR calc non Af Amer: 64 mL/min/{1.73_m2} (ref >59)
Glucose: 71 mg/dL (ref 65–99)
Potassium: 4.3 mmol/L (ref 3.5–5.2)
Sodium: 146 mmol/L — ABNORMAL HIGH (ref 134–144)
Total Protein: 6.4 g/dL (ref 6.0–8.5)

## 2013-01-27 LAB — NMR, LIPOPROFILE
Cholesterol: 186 mg/dL (ref <200)
LDL Particle Number: 1253 nmol/L — ABNORMAL HIGH (ref <1000)
LDL Size: 21.2 nm (ref >20.5)
LDLC SERPL CALC-MCNC: 98 mg/dL (ref <100)
Triglycerides by NMR: 61 mg/dL (ref <150)

## 2013-02-23 ENCOUNTER — Other Ambulatory Visit: Payer: Self-pay | Admitting: Orthopedic Surgery

## 2013-02-26 ENCOUNTER — Other Ambulatory Visit: Payer: Self-pay | Admitting: General Practice

## 2013-02-27 NOTE — Telephone Encounter (Signed)
Look at these directions?

## 2013-03-03 ENCOUNTER — Encounter (HOSPITAL_COMMUNITY): Payer: Self-pay | Admitting: Pharmacy Technician

## 2013-03-03 ENCOUNTER — Other Ambulatory Visit: Payer: Self-pay | Admitting: General Practice

## 2013-03-03 DIAGNOSIS — Z720 Tobacco use: Secondary | ICD-10-CM

## 2013-03-03 DIAGNOSIS — Z87891 Personal history of nicotine dependence: Secondary | ICD-10-CM

## 2013-03-03 MED ORDER — BUPROPION HCL ER (SR) 150 MG PO TB12: 150.0000 mg | ORAL_TABLET | Freq: Two times a day (BID) | ORAL | Status: DC

## 2013-03-05 ENCOUNTER — Encounter (HOSPITAL_COMMUNITY)
Admission: RE | Admit: 2013-03-05 | Discharge: 2013-03-05 | Disposition: A | Discharge status: Home / Self Care | Payer: Medicare Other | Source: Ambulatory Visit | Attending: Orthopedic Surgery | Admitting: Orthopedic Surgery

## 2013-03-05 DIAGNOSIS — Z01810 Encounter for preprocedural cardiovascular examination: Secondary | ICD-10-CM | POA: Insufficient documentation

## 2013-03-05 DIAGNOSIS — Z01812 Encounter for preprocedural laboratory examination: Secondary | ICD-10-CM | POA: Insufficient documentation

## 2013-03-05 DIAGNOSIS — Z01818 Encounter for other preprocedural examination: Secondary | ICD-10-CM | POA: Insufficient documentation

## 2013-03-05 HISTORY — DX: Left bundle-branch block, unspecified: I44.7

## 2013-03-05 LAB — CBC WITH DIFFERENTIAL/PLATELET
Basophils Absolute: 0 10*3/uL (ref 0.0–0.1)
Basophils Relative: 0 % (ref 0–1)
Eosinophils Absolute: 0.1 10*3/uL (ref 0.0–0.7)
Lymphs Abs: 2.2 10*3/uL (ref 0.7–4.0)
MCH: 31 pg (ref 26.0–34.0)
MCHC: 34.2 g/dL (ref 30.0–36.0)
MCV: 90.6 fL (ref 78.0–100.0)
Neutro Abs: 5.3 10*3/uL (ref 1.7–7.7)
Neutrophils Relative %: 68 % (ref 43–77)
Platelets: 345 10*3/uL (ref 150–400)
RBC: 4.36 MIL/uL (ref 3.87–5.11)
RDW: 14.2 % (ref 11.5–15.5)

## 2013-03-05 LAB — BASIC METABOLIC PANEL
BUN: 12 mg/dL (ref 6–23)
CO2: 26 mEq/L (ref 19–32)
Calcium: 9.2 mg/dL (ref 8.4–10.5)
Creatinine, Ser: 0.9 mg/dL (ref 0.50–1.10)
GFR calc Af Amer: 71 mL/min — ABNORMAL LOW (ref >90)
GFR calc non Af Amer: 61 mL/min — ABNORMAL LOW (ref >90)
Glucose, Bld: 74 mg/dL (ref 70–99)
Sodium: 144 mEq/L (ref 135–145)

## 2013-03-05 LAB — URINALYSIS, ROUTINE W REFLEX MICROSCOPIC
Bilirubin Urine: NEGATIVE
Glucose, UA: NEGATIVE mg/dL
Hgb urine dipstick: NEGATIVE
Ketones, ur: NEGATIVE mg/dL
Leukocytes, UA: NEGATIVE
pH: 6 (ref 5.0–8.0)

## 2013-03-05 LAB — PROTIME-INR
INR: 1.02 (ref 0.00–1.49)
Prothrombin Time: 13.2 seconds (ref 11.6–15.2)

## 2013-03-05 LAB — SURGICAL PCR SCREEN
MRSA, PCR: NEGATIVE
Staphylococcus aureus: NEGATIVE

## 2013-03-05 MED ORDER — CHLORHEXIDINE GLUCONATE 4 % EX LIQD: 60.0000 mL | Freq: Once | CUTANEOUS | Status: DC

## 2013-03-05 NOTE — Pre-Procedure Instructions (Signed)
[name]  03/05/2013   Your procedure is scheduled on:  March 13, 2013  Report to [facility] (Entrance A) at 11 AM.  Call this number if you have problems the morning of surgery: [phone]   Remember:   Do not eat food or drink liquids after midnight.   Take these medicines the morning of surgery with A SIP OF WATER: ALPRAZolam (XANAX), budesonide-formoterol (SYMBICORT), buPROPion (WELLBUTRIN ,cetirizine  (ZYRTEC), esomeprazole (NEXIUM), Pain pill as needed   Do not wear jewelry, make-up or nail polish.  Do not wear lotions, powders, or perfumes. You may wear deodorant.  Do not shave 48 hours prior to surgery.   Do not bring valuables to the hospital.  [facility] is not responsible  for any belongings or valuables.               Contacts, dentures or bridgework may not be worn into surgery.  Leave suitcase in the car. After surgery it may be brought to your room.  For patients admitted to the hospital, discharge time is determined by your                treatment team.               Patients discharged the day of surgery will not be allowed to drive  home.  Name and phone number of your driver:   Special Instructions: Shower using CHG 2 nights before surgery and the night before surgery.  If you shower the day of surgery use CHG.  Use special wash - you have one bottle of CHG for all showers.  You should use approximately 1/3 of the bottle for each shower.   Please read over the following fact sheets that you were given: Pain Booklet, Coughing and Deep Breathing, Blood Transfusion Information, Total Joint Packet and Surgical Site Infection Prevention

## 2013-03-06 ENCOUNTER — Encounter (HOSPITAL_COMMUNITY): Payer: Self-pay

## 2013-03-06 NOTE — Progress Notes (Signed)
Anesthesia chart review:  Patient is a 78 year old female scheduled for right THA on 03/13/13 by Dr. Turner Daniels.  History includes smoking, HTN, GERD, hypercholesterolemia, left BBB since 2008, normal coronaries by 2010 cath, depression, chronic DOE, fluid retention, history of transfusion, bilateral cataract extractions, cystoscopy /14, L2-3 laminectomy with resection of synovial cyst 04/2012. PCP is listed as Philomena Doheny, FNP. Pulmonologist is Dr. Marchelle Gearing. Cardiologist is Dr. Antoine Poche.  Last cardiology visit was with Herma Carson, PA-C in 06/2011.  EKG on 03/05/13 showed NSR with sinus arrhythmia, left BBB.  No significant change since previous tracing.  Cardiac cath on 05/11/08 showed: Normal coronary arteries. Normal left ventricular function, EF 60%. Normal right heart pressures.  Echo on 04/22/07 showed: - Overall left ventricular systolic function was normal. Left ventricular ejection fraction was estimated , range being 55 % to 60 %. There were no left ventricular regional wall motion abnormalities. Doppler parameters were consistent with abnormal left ventricular relaxation. There was marked dyssynergic motion of the interventricular septum, consistent with a conduction abnormality or paced rhythm. - There was mild to moderate aortic valvular regurgitation. - The pulmonary veins were grossly normal. - The estimated peak pulmonary artery systolic pressure was mildly increased.  PFTs on 07/19/11 showed FVC 2.66 (136%), FEV1 1.75 (116%), FEF 25-75% 0.91 (66%). Methacholine challenge test 07/19/11 was positive at 0.25 level and suggestive of asthma.  Preoperative CXR and labs noted.  Her EKG appears stable.  Normal coronaries in 2010, but with a history of mild to moderate AI. She tolerated lumbar surgery earlier this year.  If no acute changes/new cardiopulmonary symptoms then I would anticipate that she could proceed as planned.  Velna Ochs [facility] Short Stay  Center/Anesthesiology Phone [phone] 03/06/2013 1:39 PM

## 2013-03-12 MED ORDER — VANCOMYCIN HCL IN DEXTROSE 1-5 GM/200ML-% IV SOLN
1000.0000 mg | INTRAVENOUS | Status: AC
  Administered 2013-03-13: 1000 mg via INTRAVENOUS
  Filled 2013-03-12: qty 200

## 2013-03-12 NOTE — H&P (Signed)
TOTAL HIP ADMISSION H&P  Patient is admitted for right total hip arthroplasty.  Subjective:  Chief Complaint: right hip pain  HPI: [name], 78 y.o. female, has a history of pain and functional disability in the right hip(s) due to arthritis and patient has failed non-surgical conservative treatments for greater than 12 weeks to include NSAID's and/or analgesics, use of assistive devices and activity modification.  Onset of symptoms was gradual starting several years ago with gradually worsening course since that time.The patient noted no past surgery on the right hip(s).  Patient currently rates pain in the right hip at 10 out of 10 with activity. Patient has night pain, worsening of pain with activity and weight bearing, pain that interfers with activities of daily living and pain with passive range of motion. Patient has evidence of subchondral cysts by imaging studies. This condition presents safety issues increasing the risk of falls.   There is no current active infection.  Patient Active Problem List   Diagnosis Date Noted  . ALLERGIC RHINITIS 12/07/2008  . Chronic cough 12/07/2008  . HEPATITIS B 08/16/2008  . HYPERCHOLESTEROLEMIA 08/16/2008  . HYPERTENSION 08/16/2008  . OSTEOARTHRITIS 08/16/2008  . DISC DEGENERATION 07/24/2007  . CERVICAL SPASM 07/24/2007   Past Medical History  Diagnosis Date  . Fluid retention   . Hypertension   . High cholesterol   . Depression   . Allergic rhinitis   . GERD (gastroesophageal reflux disease)   . Shortness of breath     with exertion  . Pelvic pain   . Arthritis     back  . History of blood transfusion   . Left bundle branch block     diagnosed '08; normal coronaries by cath 2010    Past Surgical History  Procedure Laterality Date  . Cholecystectomy  1991  . Abdominal hysterectomy      Many years ago  . Tonsillectomy    . Appendectomy    . Back surgery  07/21/2008  . Rotator cuff repair  2011    right  . Cataract  extraction w/phaco  04/16/2011    Procedure: CATARACT EXTRACTION PHACO AND INTRAOCULAR LENS PLACEMENT (IOC);  Surgeon: Susa Simmonds;  Location: [facility];  Service: Ophthalmology;  Laterality: Left;  CDE=11.35  . Hammer toe surgery  11/22/11    [facility], [name]  . Cataract extraction w/phaco  01/28/2012    Procedure: CATARACT EXTRACTION PHACO AND INTRAOCULAR LENS PLACEMENT (IOC);  Surgeon: Susa Simmonds, MD;  Location: [facility];  Service: Ophthalmology;  Laterality: Right;  CDI:8.15  . Cysto with hydrodistension  05/29/2012    Procedure: CYSTOSCOPY/HYDRODISTENSION;  Surgeon: Martina Sinner, MD;  Location: [facility];  Service: Urology;  Laterality: N/A;  INSTILLATION OF MARCAINE AND PYRIDIUM   . Eye surgery    . Lumbar laminectomy/decompression microdiscectomy Left 06/06/2012    Procedure: LUMBAR LAMINECTOMY/DECOMPRESSION MICRODISCECTOMY 1 LEVEL;  Surgeon: Maeola Harman, MD;  Location: [facility];  Service: Neurosurgery;  Laterality: Left;  Left Lumbar two-three Laminectomy for resection of synovial cyst  . Cardiac catheterization  05/11/2008    NL coronaries, EF 60%    No prescriptions prior to admission   Allergies  Allergen Reactions  . Codeine Itching and Swelling    eye irritation (prescribed percocet)  . Lisinopril-Hydrochlorothiazide     Lip swelling. Stopped by ENT. Patient is on Losartan without any issues   . Penicillins Itching and Swelling    eye irritation    History  Substance Use  Topics  . Smoking status: Current Some Day Smoker -- 0.10 packs/day for 40 years    Types: Cigarettes  . Smokeless tobacco: Never Used     Comment: smokes 2 cigarettes a day  . Alcohol Use: No    Family History  Problem Relation Age of Onset  . Colon cancer Neg Hx   . Anesthesia problems Neg Hx   . Hypotension Neg Hx   . Malignant hyperthermia Neg Hx   . Pseudochol deficiency Neg Hx   . Diabetes Other   . Allergies Other      Review of Systems  Constitutional: Negative.    HENT: Negative.   Eyes: Negative.   Respiratory: Negative.   Cardiovascular: Negative.   Musculoskeletal: Positive for joint pain.  Skin: Negative.   Neurological: Negative.   Endo/Heme/Allergies: Negative.   Psychiatric/Behavioral: Negative.     Objective:  Physical Exam  Constitutional: She is oriented to person, place, and time. She appears well-developed and well-nourished.  HENT:  Head: Normocephalic and atraumatic.  Eyes: Pupils are equal, round, and reactive to light.  Neck: Normal range of motion.  Cardiovascular: Intact distal pulses.   Respiratory: Effort normal.  Musculoskeletal:  The patient walks with a left-sided antalgic Trendelenburg gait, internal and external rotation of the right hip is quite irritable and exquisitely painful.  Foot tap is negative.  The skin over the right hip is intact.  Normal pulses to the foot some diminished sensation of the foot, but she says that is been ever since she had her back surgery  Neurological: She is alert and oriented to person, place, and time.  Skin: Skin is warm and dry.  Psychiatric: She has a normal mood and affect. Her behavior is normal. Judgment and thought content normal.    Vital signs in last 24 hours:    Labs:   Estimated body mass index is 24.10 kg/(m^2) as calculated from the following:   Height as of 01/26/13: 5\' 4"  (1.626 m).   Weight as of 01/26/13: 63.73 kg (140 lb 8 oz).   Imaging Review Plain x-ray showed mild arthritic changes.  MRI scan was accomplished showing subchondral cysts and moderate approaching severe arthritic changes.    Assessment/Plan:  End stage arthritis, right hip(s)  The patient history, physical examination, clinical judgement of the provider and imaging studies are consistent with end stage degenerative joint disease of the right hip(s) and total hip arthroplasty is deemed medically necessary. The treatment options including medical management, injection therapy, arthroscopy  and arthroplasty were discussed at length. The risks and benefits of total hip arthroplasty were presented and reviewed. The risks due to aseptic loosening, infection, stiffness, dislocation/subluxation,  thromboembolic complications and other imponderables were discussed.  The patient acknowledged the explanation, agreed to proceed with the plan and consent was signed. Patient is being admitted for inpatient treatment for surgery, pain control, PT, OT, prophylactic antibiotics, VTE prophylaxis, progressive ambulation and ADL's and discharge planning.The patient is planning to be discharged to skilled nursing facility

## 2013-03-13 ENCOUNTER — Inpatient Hospital Stay (HOSPITAL_COMMUNITY): Payer: Medicare Other

## 2013-03-13 ENCOUNTER — Encounter (HOSPITAL_COMMUNITY): Payer: Medicare Other | Admitting: Vascular Surgery

## 2013-03-13 ENCOUNTER — Inpatient Hospital Stay (HOSPITAL_COMMUNITY)
Admission: RE | Admit: 2013-03-13 | Discharge: 2013-03-17 | DRG: 470 | Disposition: A | Discharge status: Skilled Nursing Facility | Payer: Medicare Other | Source: Ambulatory Visit | Attending: Orthopedic Surgery | Admitting: Orthopedic Surgery

## 2013-03-13 ENCOUNTER — Inpatient Hospital Stay (HOSPITAL_COMMUNITY): Payer: Medicare Other | Admitting: Certified Registered Nurse Anesthetist

## 2013-03-13 ENCOUNTER — Encounter (HOSPITAL_COMMUNITY): Payer: Self-pay | Admitting: Certified Registered Nurse Anesthetist

## 2013-03-13 ENCOUNTER — Encounter (HOSPITAL_COMMUNITY)
Admission: RE | Disposition: A | Discharge status: Skilled Nursing Facility | Payer: Self-pay | Source: Ambulatory Visit | Attending: Orthopedic Surgery

## 2013-03-13 DIAGNOSIS — M1610 Unilateral primary osteoarthritis, unspecified hip: Principal | ICD-10-CM | POA: Diagnosis present

## 2013-03-13 DIAGNOSIS — M169 Osteoarthritis of hip, unspecified: Principal | ICD-10-CM | POA: Diagnosis present

## 2013-03-13 DIAGNOSIS — I10 Essential (primary) hypertension: Secondary | ICD-10-CM | POA: Diagnosis present

## 2013-03-13 DIAGNOSIS — R0609 Other forms of dyspnea: Secondary | ICD-10-CM | POA: Diagnosis not present

## 2013-03-13 DIAGNOSIS — E7800 Pure hypercholesterolemia, unspecified: Secondary | ICD-10-CM | POA: Diagnosis present

## 2013-03-13 DIAGNOSIS — R05 Cough: Secondary | ICD-10-CM | POA: Diagnosis present

## 2013-03-13 DIAGNOSIS — M1611 Unilateral primary osteoarthritis, right hip: Secondary | ICD-10-CM | POA: Diagnosis present

## 2013-03-13 DIAGNOSIS — Z79899 Other long term (current) drug therapy: Secondary | ICD-10-CM

## 2013-03-13 DIAGNOSIS — R0989 Other specified symptoms and signs involving the circulatory and respiratory systems: Secondary | ICD-10-CM | POA: Diagnosis not present

## 2013-03-13 DIAGNOSIS — F17200 Nicotine dependence, unspecified, uncomplicated: Secondary | ICD-10-CM | POA: Diagnosis present

## 2013-03-13 DIAGNOSIS — K219 Gastro-esophageal reflux disease without esophagitis: Secondary | ICD-10-CM | POA: Diagnosis present

## 2013-03-13 DIAGNOSIS — R059 Cough, unspecified: Secondary | ICD-10-CM | POA: Diagnosis present

## 2013-03-13 HISTORY — PX: TOTAL HIP ARTHROPLASTY: SHX124

## 2013-03-13 SURGERY — ARTHROPLASTY, HIP, TOTAL,POSTERIOR APPROACH
Anesthesia: General | Site: Hip | Laterality: Right | Wound class: Clean

## 2013-03-13 MED ORDER — ARTIFICIAL TEARS OP OINT
TOPICAL_OINTMENT | OPHTHALMIC | Status: DC | PRN
  Administered 2013-03-13: 1 via OPHTHALMIC

## 2013-03-13 MED ORDER — PHENYLEPHRINE HCL 10 MG/ML IJ SOLN
INTRAMUSCULAR | Status: DC | PRN
  Administered 2013-03-13 (×5): 80 ug via INTRAVENOUS

## 2013-03-13 MED ORDER — LOSARTAN POTASSIUM 50 MG PO TABS
50.0000 mg | ORAL_TABLET | Freq: Every day | ORAL | Status: DC
  Administered 2013-03-14 – 2013-03-17 (×4): 50 mg via ORAL
  Filled 2013-03-13 (×4): qty 1

## 2013-03-13 MED ORDER — RALOXIFENE HCL 60 MG PO TABS
60.0000 mg | ORAL_TABLET | Freq: Every day | ORAL | Status: DC
  Administered 2013-03-14 – 2013-03-17 (×4): 60 mg via ORAL
  Filled 2013-03-13 (×4): qty 1

## 2013-03-13 MED ORDER — METOCLOPRAMIDE HCL 5 MG/ML IJ SOLN
INTRAMUSCULAR | Status: DC | PRN
  Administered 2013-03-13: 10 mg via INTRAVENOUS

## 2013-03-13 MED ORDER — MIDAZOLAM HCL 5 MG/5ML IJ SOLN
INTRAMUSCULAR | Status: DC | PRN
  Administered 2013-03-13: 2 mg via INTRAVENOUS

## 2013-03-13 MED ORDER — METHOCARBAMOL 100 MG/ML IJ SOLN
500.0000 mg | Freq: Four times a day (QID) | INTRAVENOUS | Status: DC | PRN
  Filled 2013-03-13: qty 5

## 2013-03-13 MED ORDER — KCL IN DEXTROSE-NACL 20-5-0.45 MEQ/L-%-% IV SOLN
INTRAVENOUS | Status: DC
  Administered 2013-03-13 (×2): via INTRAVENOUS
  Filled 2013-03-13 (×4): qty 1000

## 2013-03-13 MED ORDER — FENTANYL CITRATE 0.05 MG/ML IJ SOLN
INTRAMUSCULAR | Status: DC | PRN
  Administered 2013-03-13: 100 ug via INTRAVENOUS
  Administered 2013-03-13: 50 ug via INTRAVENOUS

## 2013-03-13 MED ORDER — ASPIRIN EC 325 MG PO TBEC
325.0000 mg | DELAYED_RELEASE_TABLET | Freq: Every day | ORAL | Status: DC
  Administered 2013-03-14 – 2013-03-17 (×4): 325 mg via ORAL
  Filled 2013-03-13 (×5): qty 1

## 2013-03-13 MED ORDER — ONDANSETRON HCL 4 MG PO TABS: 4.0000 mg | ORAL_TABLET | Freq: Four times a day (QID) | ORAL | Status: DC | PRN

## 2013-03-13 MED ORDER — ASPIRIN EC 325 MG PO TBEC: 325.0000 mg | DELAYED_RELEASE_TABLET | Freq: Two times a day (BID) | ORAL | Status: DC

## 2013-03-13 MED ORDER — TIZANIDINE HCL 2 MG PO CAPS: 2.0000 mg | ORAL_CAPSULE | Freq: Three times a day (TID) | ORAL | Status: DC

## 2013-03-13 MED ORDER — HYDROMORPHONE HCL PF 1 MG/ML IJ SOLN: 0.2500 mg | INTRAMUSCULAR | Status: DC | PRN

## 2013-03-13 MED ORDER — ACETAMINOPHEN 325 MG PO TABS
650.0000 mg | ORAL_TABLET | Freq: Four times a day (QID) | ORAL | Status: DC | PRN
  Administered 2013-03-14 – 2013-03-16 (×4): 650 mg via ORAL
  Filled 2013-03-13 (×4): qty 2

## 2013-03-13 MED ORDER — MIDAZOLAM HCL 2 MG/2ML IJ SOLN: 1.0000 mg | INTRAMUSCULAR | Status: DC | PRN

## 2013-03-13 MED ORDER — SODIUM CHLORIDE 0.9 % IR SOLN
Status: DC | PRN
  Administered 2013-03-13: 1000 mL

## 2013-03-13 MED ORDER — SENNOSIDES-DOCUSATE SODIUM 8.6-50 MG PO TABS
1.0000 | ORAL_TABLET | Freq: Every evening | ORAL | Status: DC | PRN
  Administered 2013-03-16: 1 via ORAL
  Filled 2013-03-13: qty 1

## 2013-03-13 MED ORDER — HYDROMORPHONE HCL PF 1 MG/ML IJ SOLN
0.5000 mg | INTRAMUSCULAR | Status: DC | PRN
  Administered 2013-03-13 – 2013-03-14 (×2): 0.5 mg via INTRAVENOUS
  Filled 2013-03-13 (×2): qty 1

## 2013-03-13 MED ORDER — BUDESONIDE-FORMOTEROL FUMARATE 160-4.5 MCG/ACT IN AERO
1.0000 | INHALATION_SPRAY | Freq: Two times a day (BID) | RESPIRATORY_TRACT | Status: DC
  Administered 2013-03-13 – 2013-03-17 (×8): 1 via RESPIRATORY_TRACT
  Filled 2013-03-13: qty 6

## 2013-03-13 MED ORDER — PREDNISONE 20 MG PO TABS
40.0000 mg | ORAL_TABLET | Freq: Every day | ORAL | Status: DC
  Administered 2013-03-16 – 2013-03-17 (×2): 40 mg via ORAL
  Filled 2013-03-13 (×4): qty 2

## 2013-03-13 MED ORDER — MAGNESIUM CITRATE PO SOLN
1.0000 | Freq: Once | ORAL | Status: AC | PRN
Start: 2013-03-13 — End: 2013-03-13
  Filled 2013-03-13: qty 296

## 2013-03-13 MED ORDER — BUPIVACAINE-EPINEPHRINE (PF) 0.5% -1:200000 IJ SOLN
INTRAMUSCULAR | Status: AC
  Filled 2013-03-13: qty 10

## 2013-03-13 MED ORDER — BUPIVACAINE-EPINEPHRINE 0.5% -1:200000 IJ SOLN
INTRAMUSCULAR | Status: DC | PRN
  Administered 2013-03-13: 10 mL

## 2013-03-13 MED ORDER — OXYCODONE HCL 5 MG PO TABS
5.0000 mg | ORAL_TABLET | ORAL | Status: DC | PRN
  Administered 2013-03-13 – 2013-03-14 (×2): 5 mg via ORAL
  Administered 2013-03-14: 10 mg via ORAL
  Filled 2013-03-13 (×2): qty 1
  Filled 2013-03-13 (×2): qty 2
  Filled 2013-03-13: qty 1

## 2013-03-13 MED ORDER — GLYCOPYRROLATE 0.2 MG/ML IJ SOLN
INTRAMUSCULAR | Status: DC | PRN
  Administered 2013-03-13: .8 mg via INTRAVENOUS

## 2013-03-13 MED ORDER — LORATADINE 10 MG PO TABS
10.0000 mg | ORAL_TABLET | Freq: Every day | ORAL | Status: DC
  Administered 2013-03-14 – 2013-03-17 (×4): 10 mg via ORAL
  Filled 2013-03-13 (×5): qty 1

## 2013-03-13 MED ORDER — HYDROMORPHONE HCL PF 1 MG/ML IJ SOLN
INTRAMUSCULAR | Status: AC
  Filled 2013-03-13: qty 1

## 2013-03-13 MED ORDER — PROPOFOL 10 MG/ML IV BOLUS
INTRAVENOUS | Status: DC | PRN
  Administered 2013-03-13: 120 mg via INTRAVENOUS

## 2013-03-13 MED ORDER — FENTANYL CITRATE 0.05 MG/ML IJ SOLN: 50.0000 ug | Freq: Once | INTRAMUSCULAR | Status: DC

## 2013-03-13 MED ORDER — PANTOPRAZOLE SODIUM 40 MG PO TBEC
80.0000 mg | DELAYED_RELEASE_TABLET | Freq: Every day | ORAL | Status: DC
  Administered 2013-03-14 – 2013-03-17 (×4): 80 mg via ORAL
  Filled 2013-03-13 (×3): qty 2

## 2013-03-13 MED ORDER — LACTATED RINGERS IV SOLN
INTRAVENOUS | Status: DC
  Administered 2013-03-13: 12:00:00 via INTRAVENOUS

## 2013-03-13 MED ORDER — DEXTROSE-NACL 5-0.45 % IV SOLN: INTRAVENOUS | Status: DC

## 2013-03-13 MED ORDER — AMITRIPTYLINE HCL 25 MG PO TABS
25.0000 mg | ORAL_TABLET | Freq: Every day | ORAL | Status: DC
  Administered 2013-03-13 – 2013-03-16 (×4): 25 mg via ORAL
  Filled 2013-03-13 (×5): qty 1

## 2013-03-13 MED ORDER — ONDANSETRON HCL 4 MG/2ML IJ SOLN
INTRAMUSCULAR | Status: DC | PRN
  Administered 2013-03-13: 4 mg via INTRAVENOUS

## 2013-03-13 MED ORDER — LACTATED RINGERS IV SOLN
INTRAVENOUS | Status: DC | PRN
  Administered 2013-03-13: 14:00:00 via INTRAVENOUS

## 2013-03-13 MED ORDER — PHENOL 1.4 % MT LIQD: 1.0000 | OROMUCOSAL | Status: DC | PRN

## 2013-03-13 MED ORDER — DOCUSATE SODIUM 100 MG PO CAPS
100.0000 mg | ORAL_CAPSULE | Freq: Two times a day (BID) | ORAL | Status: DC
  Administered 2013-03-13 – 2013-03-16 (×7): 100 mg via ORAL
  Filled 2013-03-13 (×9): qty 1

## 2013-03-13 MED ORDER — BISACODYL 5 MG PO TBEC
5.0000 mg | DELAYED_RELEASE_TABLET | Freq: Every day | ORAL | Status: DC | PRN
  Administered 2013-03-16: 5 mg via ORAL
  Filled 2013-03-13 (×2): qty 1

## 2013-03-13 MED ORDER — DIPHENHYDRAMINE HCL 12.5 MG/5ML PO ELIX: 12.5000 mg | ORAL_SOLUTION | ORAL | Status: DC | PRN

## 2013-03-13 MED ORDER — ACETAMINOPHEN 650 MG RE SUPP: 650.0000 mg | Freq: Four times a day (QID) | RECTAL | Status: DC | PRN

## 2013-03-13 MED ORDER — ONDANSETRON HCL 4 MG/2ML IJ SOLN
4.0000 mg | Freq: Four times a day (QID) | INTRAMUSCULAR | Status: DC | PRN
  Administered 2013-03-14: 4 mg via INTRAVENOUS
  Filled 2013-03-13: qty 2

## 2013-03-13 MED ORDER — BUPROPION HCL ER (SR) 150 MG PO TB12
150.0000 mg | ORAL_TABLET | Freq: Two times a day (BID) | ORAL | Status: DC
  Administered 2013-03-13 – 2013-03-16 (×6): 150 mg via ORAL
  Filled 2013-03-13 (×9): qty 1

## 2013-03-13 MED ORDER — METOCLOPRAMIDE HCL 5 MG/ML IJ SOLN: 5.0000 mg | Freq: Three times a day (TID) | INTRAMUSCULAR | Status: DC | PRN

## 2013-03-13 MED ORDER — LIDOCAINE HCL (CARDIAC) 20 MG/ML IV SOLN
INTRAVENOUS | Status: DC | PRN
  Administered 2013-03-13: 80 mg via INTRAVENOUS

## 2013-03-13 MED ORDER — ALPRAZOLAM 0.5 MG PO TABS
0.5000 mg | ORAL_TABLET | Freq: Two times a day (BID) | ORAL | Status: DC | PRN
  Administered 2013-03-14: 0.5 mg via ORAL
  Filled 2013-03-13: qty 1

## 2013-03-13 MED ORDER — TRANEXAMIC ACID 100 MG/ML IV SOLN
1000.0000 mg | INTRAVENOUS | Status: AC
  Administered 2013-03-13: 1000 mg via INTRAVENOUS
  Filled 2013-03-13: qty 10

## 2013-03-13 MED ORDER — MENTHOL 3 MG MT LOZG: 1.0000 | LOZENGE | OROMUCOSAL | Status: DC | PRN

## 2013-03-13 MED ORDER — NEOSTIGMINE METHYLSULFATE 1 MG/ML IJ SOLN
INTRAMUSCULAR | Status: DC | PRN
  Administered 2013-03-13: 4 mg via INTRAVENOUS

## 2013-03-13 MED ORDER — OXYCODONE-ACETAMINOPHEN 10-325 MG PO TABS: 1.0000 | ORAL_TABLET | Freq: Four times a day (QID) | ORAL | Status: DC | PRN

## 2013-03-13 MED ORDER — ROCURONIUM BROMIDE 100 MG/10ML IV SOLN
INTRAVENOUS | Status: DC | PRN
  Administered 2013-03-13: 50 mg via INTRAVENOUS

## 2013-03-13 MED ORDER — METOCLOPRAMIDE HCL 10 MG PO TABS: 5.0000 mg | ORAL_TABLET | Freq: Three times a day (TID) | ORAL | Status: DC | PRN

## 2013-03-13 MED ORDER — EPHEDRINE SULFATE 50 MG/ML IJ SOLN
INTRAMUSCULAR | Status: DC | PRN
  Administered 2013-03-13: 5 mg via INTRAVENOUS

## 2013-03-13 MED ORDER — METHOCARBAMOL 500 MG PO TABS
500.0000 mg | ORAL_TABLET | Freq: Four times a day (QID) | ORAL | Status: DC | PRN
  Administered 2013-03-14 – 2013-03-15 (×3): 500 mg via ORAL
  Filled 2013-03-13 (×4): qty 1

## 2013-03-13 SURGICAL SUPPLY — 51 items
BLADE SAW SGTL 18X1.27X75 (BLADE) ×2 IMPLANT
BRUSH FEMORAL CANAL (MISCELLANEOUS) IMPLANT
CAPT HIP PF MOP ×2 IMPLANT
CLOTH BEACON ORANGE TIMEOUT ST (SAFETY) ×2 IMPLANT
COVER BACK TABLE 24X17X13 BIG (DRAPES) IMPLANT
COVER SURGICAL LIGHT HANDLE (MISCELLANEOUS) ×4 IMPLANT
DRAPE ORTHO SPLIT 77X108 STRL (DRAPES) ×1
DRAPE PROXIMA HALF (DRAPES) ×4 IMPLANT
DRAPE SURG ORHT 6 SPLT 77X108 (DRAPES) ×1 IMPLANT
DRAPE U-SHAPE 47X51 STRL (DRAPES) ×2 IMPLANT
DRILL BIT 7/64X5 (BIT) ×2 IMPLANT
DRSG AQUACEL AG ADV 3.5X10 (GAUZE/BANDAGES/DRESSINGS) ×2 IMPLANT
DURAPREP 26ML APPLICATOR (WOUND CARE) ×2 IMPLANT
ELECT BLADE 4.0 EZ CLEAN MEGAD (MISCELLANEOUS)
ELECT REM PT RETURN 9FT ADLT (ELECTROSURGICAL) ×2
ELECTRODE BLDE 4.0 EZ CLN MEGD (MISCELLANEOUS) IMPLANT
ELECTRODE REM PT RTRN 9FT ADLT (ELECTROSURGICAL) ×1 IMPLANT
GAUZE XEROFORM 1X8 LF (GAUZE/BANDAGES/DRESSINGS) IMPLANT
GLOVE BIO SURGEON STRL SZ7.5 (GLOVE) ×2 IMPLANT
GLOVE BIO SURGEON STRL SZ8.5 (GLOVE) ×4 IMPLANT
GLOVE BIOGEL PI IND STRL 8 (GLOVE) ×2 IMPLANT
GLOVE BIOGEL PI IND STRL 9 (GLOVE) ×1 IMPLANT
GLOVE BIOGEL PI INDICATOR 8 (GLOVE) ×2
GLOVE BIOGEL PI INDICATOR 9 (GLOVE) ×1
GOWN PREVENTION PLUS XLARGE (GOWN DISPOSABLE) ×2 IMPLANT
GOWN STRL NON-REIN LRG LVL3 (GOWN DISPOSABLE) ×4 IMPLANT
GOWN STRL REIN XL XLG (GOWN DISPOSABLE) ×4 IMPLANT
HANDPIECE INTERPULSE COAX TIP (DISPOSABLE)
HOOD PEEL AWAY FACE SHEILD DIS (HOOD) ×4 IMPLANT
KIT BASIN OR (CUSTOM PROCEDURE TRAY) ×2 IMPLANT
KIT ROOM TURNOVER OR (KITS) ×2 IMPLANT
MANIFOLD NEPTUNE II (INSTRUMENTS) ×2 IMPLANT
NEEDLE 22X1 1/2 (OR ONLY) (NEEDLE) ×2 IMPLANT
NS IRRIG 1000ML POUR BTL (IV SOLUTION) ×2 IMPLANT
PACK TOTAL JOINT (CUSTOM PROCEDURE TRAY) ×2 IMPLANT
PAD ARMBOARD 7.5X6 YLW CONV (MISCELLANEOUS) ×4 IMPLANT
PASSER SUT SWANSON 36MM LOOP (INSTRUMENTS) ×2 IMPLANT
PRESSURIZER FEMORAL UNIV (MISCELLANEOUS) IMPLANT
SET HNDPC FAN SPRY TIP SCT (DISPOSABLE) IMPLANT
SUT ETHIBOND 2 V 37 (SUTURE) ×2 IMPLANT
SUT ETHILON 3 0 FSL (SUTURE) ×2 IMPLANT
SUT VIC AB 0 CTB1 27 (SUTURE) ×2 IMPLANT
SUT VIC AB 1 CTX 36 (SUTURE) ×1
SUT VIC AB 1 CTX36XBRD ANBCTR (SUTURE) ×1 IMPLANT
SUT VIC AB 2-0 CTB1 (SUTURE) ×2 IMPLANT
SYR CONTROL 10ML LL (SYRINGE) ×2 IMPLANT
TOWEL OR 17X24 6PK STRL BLUE (TOWEL DISPOSABLE) ×2 IMPLANT
TOWEL OR 17X26 10 PK STRL BLUE (TOWEL DISPOSABLE) ×2 IMPLANT
TOWER CARTRIDGE SMART MIX (DISPOSABLE) IMPLANT
TRAY FOLEY CATH 14FR (SET/KITS/TRAYS/PACK) ×2 IMPLANT
WATER STERILE IRR 1000ML POUR (IV SOLUTION) ×8 IMPLANT

## 2013-03-13 NOTE — Transfer of Care (Signed)
Immediate Anesthesia Transfer of Care Note  Patient: [name]  Procedure(s) Performed: Procedure(s): TOTAL HIP ARTHROPLASTY (Right)  Patient Location: PACU  Anesthesia Type:General  Level of Consciousness: patient cooperative and responds to stimulation  Airway & Oxygen Therapy: Patient Spontanous Breathing and Patient connected to face mask oxygen  Post-op Assessment: Report given to PACU RN, Post -op Vital signs reviewed and stable and Patient moving all extremities X 4  Post vital signs: Reviewed and stable  Complications: No apparent anesthesia complications

## 2013-03-13 NOTE — Interval H&P Note (Signed)
History and Physical Interval Note:  03/13/2013 1:55 PM  [name]  has presented today for surgery, with the diagnosis of OSTEOARTHRITIS RIGHT HIP  The various methods of treatment have been discussed with the patient and family. After consideration of risks, benefits and other options for treatment, the patient has consented to  Procedure(s): TOTAL HIP ARTHROPLASTY (Right) as a surgical intervention .  The patient's history has been reviewed, patient examined, no change in status, stable for surgery.  I have reviewed the patient's chart and labs.  Questions were answered to the patient's satisfaction.     Nestor Lewandowsky

## 2013-03-13 NOTE — Anesthesia Postprocedure Evaluation (Signed)
  Anesthesia Post-op Note  Patient: [name]  Procedure(s) Performed: Procedure(s): TOTAL HIP ARTHROPLASTY (Right)  Patient Location: PACU  Anesthesia Type:General  Level of Consciousness: sedated, patient cooperative and responds to stimulation and voice  Airway and Oxygen Therapy: Patient Spontanous Breathing and Patient connected to nasal cannula oxygen  Post-op Pain: mild  Post-op Assessment: Post-op Vital signs reviewed, Patient's Cardiovascular Status Stable, Respiratory Function Stable, Patent Airway, No signs of Nausea or vomiting and Pain level controlled  Post-op Vital Signs: Reviewed and stable  Complications: No apparent anesthesia complications

## 2013-03-13 NOTE — Evaluation (Signed)
Physical Therapy Evaluation Patient Details Name: [name] MRN: [id number] DOB: Oct 27, 1934 Today's Date: 03/13/2013 Time: 1324-4010 PT Time Calculation (min): 23 min  PT Assessment / Plan / Recommendation History of Present Illness  Patient is a 78 yo female s/p Rt THA.  Clinical Impression  Patient presents with problems listed below.  Will benefit from acute PT to maximize independence prior to discharge.      PT Assessment  Patient needs continued PT services    Follow Up Recommendations  SNF     Does the patient have the potential to tolerate intense rehabilitation      Barriers to Discharge Decreased caregiver support Patient lives alone.    Equipment Recommendations  None recommended by PT    Recommendations for Other Services     Frequency 7X/week    Precautions / Restrictions Precautions Precautions: Posterior Hip;Fall Precaution Booklet Issued: Yes (comment) Precaution Comments: Reviewed precautions with patient - very lethargic. Restrictions Weight Bearing Restrictions: Yes RLE Weight Bearing: Weight bearing as tolerated   Pertinent Vitals/Pain       Mobility  Bed Mobility Bed Mobility: Supine to Sit;Sitting - Scoot to Edge of Bed;Sit to Supine Supine to Sit: 3: Mod assist;With rails;HOB elevated Sitting - Scoot to Edge of Bed: 4: Min assist;With rail Sit to Supine: 3: Mod assist;HOB elevated Details for Bed Mobility Assistance: Verbal cues for technique and maintaining hip precautions.  Assist to move RLE off of bed and to raise trunk to sitting position.  Patient requiring increased time for mobility.  Patient able to sit at EOB x 6 minutes with fairly good balance.  Assist to move LE's onto bed to return to supine. Transfers Transfers: Not assessed    Exercises Total Joint Exercises Ankle Circles/Pumps: AROM;Both;10 reps;Seated   PT Diagnosis: Difficulty walking;Acute pain  PT Problem List: Decreased strength;Decreased range of  motion;Decreased activity tolerance;Decreased balance;Decreased mobility;Decreased knowledge of use of DME;Decreased knowledge of precautions;Pain PT Treatment Interventions: DME instruction;Gait training;Functional mobility training;Therapeutic exercise;Patient/family education     PT Goals(Current goals can be found in the care plan section) Acute Rehab PT Goals Patient Stated Goal: To decrease pain PT Goal Formulation: With patient/family Time For Goal Achievement: 03/27/13 Potential to Achieve Goals: Good  Visit Information  Last PT Received On: 03/13/13 Assistance Needed: +1 History of Present Illness: Patient is a 78 yo female s/p Rt THA.       Prior Functioning  Home Living Family/patient expects to be discharged to:: Skilled nursing facility Living Arrangements: Alone Home Equipment: Dan Humphreys - 2 wheels;Cane - single point;Bedside commode;Shower seat Prior Function Level of Independence: Independent Communication Communication: No difficulties    Cognition  Cognition Arousal/Alertness: Lethargic;Suspect due to medications Behavior During Therapy: [facility] for tasks assessed/performed Overall Cognitive Status: Within Functional Limits for tasks assessed    Extremity/Trunk Assessment Upper Extremity Assessment Upper Extremity Assessment: Overall WFL for tasks assessed Lower Extremity Assessment Lower Extremity Assessment: RLE deficits/detail RLE Deficits / Details: Decreased strength and ROM due to surgery/pain/precautions.  Able to assist with moving RLE off of bed. RLE: Unable to fully assess due to pain Cervical / Trunk Assessment Cervical / Trunk Assessment: Normal   Balance Balance Balance Assessed: Yes Static Sitting Balance Static Sitting - Balance Support: Bilateral upper extremity supported;Feet supported Static Sitting - Level of Assistance: 5: Stand by assistance Static Sitting - Comment/# of Minutes: 6 minutes.  Using bil. UE's to maintain balance.  End of  Session PT - End of Session Equipment Utilized During Treatment: Oxygen  Activity Tolerance: Patient limited by lethargy;Patient limited by pain Patient left: in bed;with call bell/phone within reach;with family/visitor present Nurse Communication: Mobility status  GP     Vena Austria 03/13/2013, 7:59 PM Durenda Hurt. Renaldo Fiddler, [facility] Acute Rehab Services Pager [phone]

## 2013-03-13 NOTE — Op Note (Signed)
OPERATIVE REPORT    DATE OF PROCEDURE:  03/13/2013       PREOPERATIVE DIAGNOSIS:  OSTEOARTHRITIS RIGHT HIP                                                          POSTOPERATIVE DIAGNOSIS:  osteoarthritis right hip                                                           PROCEDURE:  R total hip arthroplasty using a 52 mm DePuy Pinnacle  Cup, [employer], 10-degree polyethylene liner index superior  and posterior, a +0 36 mm ceramic head, a [phone] SROM stem, 18Dsm Sleeve   SURGEON: Patrese Neal J    ASSISTANT:   Eric K. [employer]  (present throughout entire procedure and necessary for timely completion of the procedure)   ANESTHESIA: General BLOOD LOSS: 300 FLUID REPLACEMENT: 1500 crystalloid DRAINS: Foley Catheter URINE OUTPUT: 300cc COMPLICATIONS: none    INDICATIONS FOR PROCEDURE: A 78 y.o. year-old With  OSTEOARTHRITIS RIGHT HIP   for 2 years, x-rays show bone-on-bone arthritic changes. Despite conservative measures with observation, anti-inflammatory medicine, narcotics, use of a cane, has severe unremitting pain and can ambulate only a few blocks before resting.  Patient desires elective R total hip arthroplasty to decrease pain and increase function. The risks, benefits, and alternatives were discussed at length including but not limited to the risks of infection, bleeding, nerve injury, stiffness, blood clots, the need for revision surgery, cardiopulmonary complications, among others, and they were willing to proceed. Questions answered     PROCEDURE IN DETAIL: The patient was identified by armband,  received preoperative IV antibiotics in the holding area at [facility], taken to the operating room , appropriate anesthetic monitors  were attached and general endotracheal anesthesia induced. Foley catheter was inserted. Pt was rolled into the L lateral decubitus position and fixed there with a Stulberg Mark II pelvic clamp.  The R lower extremity was  then prepped and draped  in the usual sterile fashion from the ankle to the hemipelvis. A time-out  procedure was performed. The skin along the lateral hip and thigh  infiltrated with 10 mL of 0.5% Marcaine and epinephrine solution. We  then made a posterolateral approach to the hip. With a #10 blade, a 15 cm  incision was made through the skin and subcutaneous tissue down to the level of the  IT band. Small bleeders were identified and cauterized. The IT band was cut in  line with skin incision exposing the greater trochanter. A Cobra retractor was placed between the gluteus minimus and the superior hip joint capsule, and a spiked Cobra between the quadratus femoris and the inferior hip joint capsule. This isolated the short  external rotators and piriformis tendons. These were tagged with a #2 Ethibond  suture and cut off their insertion on the intertrochanteric crest. The posterior  capsule was then developed into an acetabular-based flap from Posterior Superior off of the acetabulum out over the femoral neck and back posterior inferior to the acetabular rim. This flap was tagged with two #2 Ethibond  sutures and retracted protecting the sciatic nerve. This exposed the arthritic femoral head and osteophytes. The hip was then flexed and internally rotated, dislocating the femoral head and a standard neck cut performed 1 fingerbreadth above the lesser trochanter.  A spiked Cobra was placed in the cotyloid notch and a Hohmann retractor was then used to lever the femur anteriorly off of the anterior pelvic column. A posterior-inferior wing retractor was placed at the junction of the acetabulum and the ischium completing the acetabular exposure.We then removed the peripheral osteophytes and labrum from the acetabulum. We then reamed the acetabulum up to 51 mm with basket reamers obtaining good coverage in all quadrants. We then irrigated with normal  saline solution and hammered into place a 52 mm pinnacle  cup in 45  degrees of abduction and about 20 degrees of anteversion. More  peripheral osteophytes removed and a trial 10-degree liner placed with the  index superior-posterior. The hip was then flexed and internally rotated exposing the  proximal femur, which was entered with the initiating reamer followed by  the axial reamers up to a 13.5 mm full depth and 14mm partial depth. We then conically reamed to 18D to the correct depth for a 42 base neck. The calcar was milled to 18Dsm. A trial cone and stem was inserted in the 25 degrees anteversion, with a +0 36mm trial head. Trial reduction was then performed and excellent stability was noted with at 90 of flexion with 75 of internal rotation and then full extension with maximal external rotation. The hip could not be dislocated in full extension. The knee could easily flex  to about 130 degrees. We also stretched the abductors at this point,  because of the preexisting adductor contractures. All trial components  were then removed. The acetabulum was irrigated out with normal saline  solution. A titanium Apex [facility] was then screwed into place  followed by a 10-degree polyethylene liner index superior-posterior. On  the femoral side a 18Dsm ZTT1 sleeve was hammered into place, followed by a [id number] SROM stem in 25 degrees of anteversion. At this point, a +0 36 mm ceramic head was  hammered on the stem. The hip was reduced. We checked our stability  one more time and found it to be excellent. The wound was once again  thoroughly irrigated out with normal saline solution pulse lavage. The  capsular flap and short external rotators were repaired back to the  intertrochanteric crest through drill holes with a #2 Ethibond suture.  The IT band was closed with running 1 Vicryl suture. The subcutaneous  tissue with 0 and 2-0 undyed Vicryl suture and the skin with running  interlocking 3-0 nylon suture. Dressing of Xeroform and Mepilex was   then applied. The patient was then unclamped, rolled supine, awaken extubated and taken to recovery room without difficulty in stable condition.   Maika Kaczmarek J 03/13/2013, 3:10 PM

## 2013-03-13 NOTE — Anesthesia Preprocedure Evaluation (Signed)
Anesthesia Evaluation  Patient identified by MRN, date of birth, ID band Patient awake    Reviewed: Allergy & Precautions, H&P , NPO status , Patient's Chart, lab work & pertinent test results  Airway Mallampati: II TM Distance: <3 FB Neck ROM: full    Dental   Pulmonary shortness of breath, Current Smoker,  breath sounds clear to auscultation        Cardiovascular hypertension, Rhythm:Regular Rate:Normal  LBBB on EKG   Neuro/Psych Depression    GI/Hepatic GERD-  ,(+) Hepatitis -, B  Endo/Other    Renal/GU      Musculoskeletal  (+) Arthritis -,   Abdominal   Peds  Hematology   Anesthesia Other Findings   Reproductive/Obstetrics                           Anesthesia Physical Anesthesia Plan  ASA: III  Anesthesia Plan: General   Post-op Pain Management:    Induction: Intravenous  Airway Management Planned: Oral ETT  Additional Equipment:   Intra-op Plan:   Post-operative Plan: Extubation in OR  Informed Consent: I have reviewed the patients History and Physical, chart, labs and discussed the procedure including the risks, benefits and alternatives for the proposed anesthesia with the patient or authorized representative who has indicated his/her understanding and acceptance.     Plan Discussed with: CRNA and Surgeon  Anesthesia Plan Comments:         Anesthesia Quick Evaluation

## 2013-03-13 NOTE — Preoperative (Signed)
Beta Blockers   Reason not to administer Beta Blockers:Not Applicable 

## 2013-03-14 DIAGNOSIS — M1611 Unilateral primary osteoarthritis, right hip: Secondary | ICD-10-CM | POA: Diagnosis present

## 2013-03-14 LAB — CBC
Hemoglobin: 11.4 g/dL — ABNORMAL LOW (ref 12.0–15.0)
MCH: 31.1 pg (ref 26.0–34.0)
MCHC: 34.1 g/dL (ref 30.0–36.0)
Platelets: 287 10*3/uL (ref 150–400)
RDW: 14.2 % (ref 11.5–15.5)

## 2013-03-14 LAB — BASIC METABOLIC PANEL
CO2: 25 mEq/L (ref 19–32)
Calcium: 8.2 mg/dL — ABNORMAL LOW (ref 8.4–10.5)
GFR calc Af Amer: >90 mL/min (ref >90)
GFR calc non Af Amer: 80 mL/min — ABNORMAL LOW (ref >90)
Glucose, Bld: 131 mg/dL — ABNORMAL HIGH (ref 70–99)
Potassium: 3.8 mEq/L (ref 3.5–5.1)
Sodium: 139 mEq/L (ref 135–145)

## 2013-03-14 LAB — GLUCOSE, CAPILLARY: Glucose-Capillary: 129 mg/dL — ABNORMAL HIGH (ref 70–99)

## 2013-03-14 LAB — URINALYSIS, ROUTINE W REFLEX MICROSCOPIC
Bilirubin Urine: NEGATIVE
Ketones, ur: NEGATIVE mg/dL
Leukocytes, UA: NEGATIVE
Protein, ur: NEGATIVE mg/dL
Specific Gravity, Urine: 1.007 (ref 1.005–1.030)
Urobilinogen, UA: 0.2 mg/dL (ref 0.0–1.0)

## 2013-03-14 LAB — BLOOD GAS, ARTERIAL
Bicarbonate: 24.9 mEq/L — ABNORMAL HIGH (ref 20.0–24.0)
FIO2: 0.21 %
O2 Saturation: 89.9 %
TCO2: 26.2 mmol/L (ref 0–100)
pH, Arterial: 7.388 (ref 7.350–7.450)
pO2, Arterial: 57.5 mmHg — ABNORMAL LOW (ref 80.0–100.0)

## 2013-03-14 MED ORDER — TRAMADOL HCL 50 MG PO TABS
50.0000 mg | ORAL_TABLET | Freq: Four times a day (QID) | ORAL | Status: DC | PRN
  Administered 2013-03-14 – 2013-03-17 (×9): 50 mg via ORAL
  Filled 2013-03-14 (×9): qty 1

## 2013-03-14 MED ORDER — NALOXONE HCL 0.4 MG/ML IJ SOLN
0.4000 mg | INTRAMUSCULAR | Status: DC | PRN
  Administered 2013-03-14: 0.4 mg via INTRAVENOUS
  Filled 2013-03-14: qty 1

## 2013-03-14 NOTE — Progress Notes (Signed)
Subjective: 1 Day Post-Op Procedure(s) (LRB): TOTAL HIP ARTHROPLASTY (Right) Pain control. Has sat up with therapy. Treating fluids and eating light. Passing some gas. Activity level:  May be weightbearing as tolerated right side. Diet tolerance:  Light eating Voiding:  Catheter out Patient reports pain as 3 on 0-10 scale.    Objective: Vital signs in last 24 hours: Temp:  [97.4 F (36.3 C)-100.3 F (37.9 C)] 99.6 F (37.6 C) (11/15 0500) Pulse Rate:  [50-83] 72 (11/15 0500) Resp:  [11-18] 16 (11/15 0500) BP: (112-150)/(47-92) 112/47 mmHg (11/15 0500) SpO2:  [93 %-100 %] 99 % (11/15 0917)  Labs:  Recent Labs  03/14/13 0414  HGB 11.4*    Recent Labs  03/14/13 0414  WBC 9.5  RBC 3.67*  HCT 33.4*  PLT 287    Recent Labs  03/14/13 0414  NA 139  K 3.8  CL 106  CO2 25  BUN 7  CREATININE 0.77  GLUCOSE 131*  CALCIUM 8.2*   No results found for this basename: LABPT, INR,  in the last 72 hours  Physical Exam:  Neurologically intact ABD soft Neurovascular intact Sensation intact distally Intact pulses distally Dorsiflexion/Plantar flexion intact Incision: dressing C/D/I No cellulitis present Compartment soft  Assessment/Plan:  1 Day Post-Op Procedure(s) (LRB): TOTAL HIP ARTHROPLASTY (Right) Advance diet Up with therapy Discharge to SNF on Monday Stop IV fluids when drinking sufficiently. Continue ASA 325 twice a day/SCDs. May be weightbearing as tolerated with therapy right side. Daughter is with her at time of visit. Encouraged incentive spirometer.    Ilijah Doucet R 03/14/2013, 9:40 AM

## 2013-03-14 NOTE — Significant Event (Signed)
Rapid Response Event Note  Overview: Called as second set of eyes to assist with eval of sleepy patient - RN reports arouses but difficult to stay awake. Time Called: 1450 Arrival Time: 1457 Event Type: Other (Comment) (second set of eyes for eval sleepiness)  Initial Focused Assessment:  Arouses to name but quickly falls back to sleep.  Neuro exam unremarkable except for sleepiness.  Warm and dry.  Answers appropriate.  No pain meds since 0700 this AM.  Had dose of Xanax mid-morning- takes at home.  Bil BS equal and clear.  Resps reg and unlabored - shallow when sleeping.  O2 sats  100% on 4 liter nasal cannula.  Pulmonary hx unclear - on Symbicort and Albuterol inhaler at home.  Reports some smoking.  Abd soft.  RN just cathed clear yellow urine.  Right hip DDI - unremarkable.  Denies CP or SOB.  Just states she is sleepy.  HR regular.  BP 110/54 HR 84 RR 12.     Interventions:  PA Kathlene November returned call to [employer] - ABG done.  On RA 7.38 pCO2 42 pO2 57 NAHCO3 25.  Placed on 2 liter.  Awakened and encouraged IS.  Narcan 0.4 mg IV given per order.  Patient more awake - will sleep but does not fall asleep while interacting.  C/o cold - remains warm and dry. BP 124/62 HR 85 RR 18 O2sats 98% 2 liter.  Oral temp 99.7  PA updated.  Will keep on radar to watch.  Handoff to Lupita Leash RN - staff to call as needed.     Event Summary: Name of Physician Notified: Collene Schlichter  PA at 1500    at    Outcome: Stayed in room and stabalized  Event End Time: 1645  [place]

## 2013-03-14 NOTE — Progress Notes (Addendum)
Clinical Social Work Department CLINICAL SOCIAL WORK PLACEMENT NOTE 03/14/2013  Patient:  [name], [name]  Account Number:  [id number] Admit date:  03/13/2013  Clinical Social Worker:  Jetta Lout, Theresia Majors  Date/time:  03/14/2013 05:50 PM  Clinical Social Work is seeking post-discharge placement for this patient at the following level of care:   SKILLED NURSING   (*CSW will update this form in Epic as items are completed)   03/14/2013  Patient/family provided with [facility] Department of Clinical Social Work's list of facilities offering this level of care within the geographic area requested by the patient (or if unable, by the patient's family).  03/14/2013  Patient/family informed of their freedom to choose among providers that offer the needed level of care, that participate in Medicare, Medicaid or managed care program needed by the patient, have an available bed and are willing to accept the patient.  03/14/2013  Patient/family informed of [facility]' ownership interest in [facility], as well as of the fact that they are under no obligation to receive care at this facility.  PASARR submitted to EDS on 03/14/2013 PASARR number received from EDS on 03/14/2013  FL2 transmitted to all facilities in geographic area requested by pt/family on  03/14/2013 FL2 transmitted to all facilities within larger geographic area on   Patient informed that his/her managed care company has contracts with or will negotiate with  certain facilities, including the following:     Patient/family informed of bed offers received:   Patient chooses bed at [facility] Physician recommends and patient chooses bed at    Patient to be transferred to  on  03/17/2013 Patient to be transferred to facility by [facility]  The following physician request were entered in Epic:   Additional Comments: Daughter and patient's first choice is [facility].

## 2013-03-14 NOTE — Progress Notes (Signed)
Clinical Social Work Department BRIEF PSYCHOSOCIAL ASSESSMENT 03/14/2013  Patient:  REDELL, BHANDARI     Account Number:  [id number]     Admit date:  03/13/2013  Clinical Social Worker:  Hendricks Milo  Date/Time:  03/14/2013 05:45 PM  Referred by:  Physician  Date Referred:  03/14/2013 Referred for  SNF Placement   Other Referral:   Interview type:  Family Other interview type:    PSYCHOSOCIAL DATA Living Status:  ALONE Admitted from facility:   Level of care:   Primary support name:  Phil Dopp Primary support relationship to patient:  CHILD, ADULT Degree of support available:   Very supportive at bedside.    CURRENT CONCERNS  Other Concerns:    SOCIAL WORK ASSESSMENT / PLAN Clinical Social Worker (CSW) met with daughter of patient Phil Dopp because patient was in pain. Daughter was agreeable to fax out to [facility] and the [facility] is the first choice. Patient reported that she got a letter from [facility] about her being admitted there. CSW encouraged daughter to think about a back up in case [facility] does not have a bed. Daughter verbalized her understanding.   Assessment/plan status:  Psychosocial Support/Ongoing Assessment of Needs Other assessment/ plan:   Information/referral to community resources:   CSW gave daughter SNF list.    PATIENT'S/FAMILY'S RESPONSE TO PLAN OF CARE: Daughter was appreciative of CSW visit.

## 2013-03-14 NOTE — Evaluation (Signed)
Occupational Therapy Evaluation Patient Details Name: [name] MRN: [id number] DOB: [birth year] Today's Date: 03/14/2013 Time: 1478-2956 OT Time Calculation (min): 15 min  OT Assessment / Plan / Recommendation History of present illness Patient is a 78 yo female s/p Rt THA.   Clinical Impression   Pt s/p R THA.  Will benefit from continued acute OT services to address below problem list. Limited eval session due to pt nauseous with sitting EOB. Recommend SNF for d/c planning.    OT Assessment  Patient needs continued OT Services    Follow Up Recommendations  SNF;Supervision/Assistance - 24 hour    Barriers to Discharge      Equipment Recommendations  None recommended by OT    Recommendations for Other Services    Frequency  Min 2X/week    Precautions / Restrictions Precautions Precautions: Posterior Hip;Fall Precaution Booklet Issued: Yes (comment) Precaution Comments: Reviewed precautions with patient - very lethargic. Restrictions Weight Bearing Restrictions: Yes RLE Weight Bearing: Weight bearing as tolerated   Pertinent Vitals/Pain See vitals    ADL  Grooming: Performed;Wash/dry face;Supervision/safety Where Assessed - Grooming: Unsupported sitting ADL Comments: Limited ADL participation at this time due to lethargy and pt nauseated when sitting EOB.  Able to wash face with cool wash cloth while sitting EOB.    OT Diagnosis: Generalized weakness;Acute pain  OT Problem List: Decreased strength;Decreased activity tolerance;Impaired balance (sitting and/or standing);Decreased knowledge of use of DME or AE;Decreased knowledge of precautions;Pain OT Treatment Interventions: Self-care/ADL training;DME and/or AE instruction;Therapeutic activities;Patient/family education;Balance training   OT Goals(Current goals can be found in the care plan section) Acute Rehab OT Goals Patient Stated Goal: To decrease pain OT Goal Formulation: With patient Time For Goal  Achievement: 03/28/13 Potential to Achieve Goals: Good  Visit Information  Last OT Received On: 03/14/13 Assistance Needed: +1 History of Present Illness: Patient is a 78 yo female s/p Rt THA.       Prior Functioning     Home Living Family/patient expects to be discharged to:: Skilled nursing facility Living Arrangements: Alone Home Equipment: Dan Humphreys - 2 wheels;Cane - single point;Bedside commode;Shower seat Prior Function Level of Independence: Independent         Vision/Perception     Cognition  Cognition Arousal/Alertness: Lethargic Behavior During Therapy: WFL for tasks assessed/performed Overall Cognitive Status: Within Functional Limits for tasks assessed    Extremity/Trunk Assessment Upper Extremity Assessment Upper Extremity Assessment: Overall WFL for tasks assessed     Mobility Bed Mobility Bed Mobility: Supine to Sit;Sitting - Scoot to Edge of Bed;Sit to Supine Supine to Sit: 2: Max assist;HOB elevated;With rails Sitting - Scoot to Edge of Bed: 3: Mod assist Sit to Supine: HOB flat;2: Max assist Details for Bed Mobility Assistance: Assist with LEs and to support trunk.     Exercise     Balance Static Sitting Balance Static Sitting - Balance Support: Left upper extremity supported;Feet supported Static Sitting - Level of Assistance: 5: Stand by assistance Static Sitting - Comment/# of Minutes: Sat 2 min EOB.   End of Session OT - End of Session Equipment Utilized During Treatment: Oxygen Activity Tolerance: Patient limited by fatigue;Patient limited by lethargy Patient left: in bed;with call bell/phone within reach  GO   03/14/2013 Cipriano Mile OTR/L Pager [phone] Office [phone]   Cipriano Mile 03/14/2013, 4:48 PM

## 2013-03-14 NOTE — Progress Notes (Signed)
Patient was given pain medicine early AM. Patient experienced some confusion, could answer her name, birth date but place was confusing for her. Patient's daughter said that she was up off and on all night which could explain her drowsiness. Patient continued to be lethargic. Could answer all the questions correctly. No pain medicine was given since AM. Called rapid response for another opinion. Called PA on call, ordered a stat ABG. Rapid response nurse gave narcan. Patient was a little more awake but is still drowsy. Patient has not eaten hardly anything today. Patient unable to void. Bladder scan done. In and out cath done 550 ml around 3 pm. Patient a little more awake. Will continue to monitor

## 2013-03-14 NOTE — Progress Notes (Signed)
Physical Therapy Treatment Patient Details Name: [name] MRN: [id number] DOB: [birth year] Today's Date: 03/14/2013 Time: 9629-5284 PT Time Calculation (min): 26 min  PT Assessment / Plan / Recommendation  History of Present Illness Patient is a 78 yo female s/p Rt THA.   PT Comments   RN requesting assistance to help get pt up to use BSC.  When pt stood up she became nauseated and was returned to sitting.  Still nauseated upon sitting and became more lethargic but did open eyes and answer questions. Pt cool and clammy therefore returned to supine. After about 2 minutes pt became more interactive again.  Daughter present for entire for entire session.      Follow Up Recommendations  SNF     Does the patient have the potential to tolerate intense rehabilitation     Barriers to Discharge        Equipment Recommendations  None recommended by PT    Recommendations for Other Services    Frequency 7X/week   Progress towards PT Goals Progress towards PT goals: Progressing toward goals (anticipate slow progress)  Plan Current plan remains appropriate    Precautions / Restrictions Precautions Precautions: Posterior Hip;Fall Precaution Booklet Issued: Yes (comment) Precaution Comments: Reviewed precautions with patient - very lethargic. Restrictions Weight Bearing Restrictions: Yes RLE Weight Bearing: Weight bearing as tolerated   Pertinent Vitals/Pain Did not rate, but demonstrated signs/symptoms of pain with mobility.      Mobility  Bed Mobility Bed Mobility: Supine to Sit;Sit to Supine Supine to Sit: 2: Max assist;HOB elevated;With rails Sit to Supine: 1: +2 Total assist;HOB flat Sit to Supine: Patient Percentage: 0% Details for Bed Mobility Assistance: Pt required total assist to lay down secondary to not feeling well.  Verbal cues and encouragement needed for supine-sit Transfers Transfers: Sit to Stand;Stand to Sit Sit to Stand: 2: Max assist;1: +2 Total assist;From  bed Stand to Sit: 3: Mod assist;To bed;With upper extremity assist Details for Transfer Assistance: Pt became nauseated when standing and needed to sit therefore did not attempt transfer to [facility] as planned.   Ambulation/Gait Ambulation/Gait Assistance: Not tested (comment) (Pt stood for about 30 seconds before becoming nauseated and )    Exercises     PT Diagnosis:    PT Problem List:   PT Treatment Interventions:     PT Goals (current goals can now be found in the care plan section)    Visit Information  Last PT Received On: 03/14/13 Assistance Needed: +1 History of Present Illness: Patient is a 78 yo female s/p Rt THA.    Subjective Data      Cognition  Cognition Arousal/Alertness: Lethargic Behavior During Therapy: WFL for tasks assessed/performed Overall Cognitive Status: Within Functional Limits for tasks assessed    Balance  Balance Balance Assessed: Yes Static Sitting Balance Static Sitting - Balance Support: Bilateral upper extremity supported;Feet supported Static Sitting - Level of Assistance: 4: Min assist Static Sitting - Comment/# of Minutes: Sat EOB about 7-8 minutes before standing and a few minutes after standing  End of Session PT - End of Session Equipment Utilized During Treatment: Oxygen (placed by RN after returning to supine) Activity Tolerance: Patient limited by lethargy;Treatment limited secondary to medical complications (Comment) Patient left: in bed;with call bell/phone within reach;with family/visitor present Nurse Communication: Mobility status   GP     Donnella Sham 03/14/2013, 12:12 PM Lavona Mound, PT  [phone] 03/14/2013

## 2013-03-15 LAB — CBC
Hemoglobin: 10.9 g/dL — ABNORMAL LOW (ref 12.0–15.0)
MCHC: 33.5 g/dL (ref 30.0–36.0)
Platelets: 244 10*3/uL (ref 150–400)
RBC: 3.56 MIL/uL — ABNORMAL LOW (ref 3.87–5.11)

## 2013-03-15 NOTE — Progress Notes (Addendum)
Subjective: 2 Days Post-Op Procedure(s) (LRB): TOTAL HIP ARTHROPLASTY (Right) Last evening patient was lethargic and rapid response was called. Blood gas was checked, low oxygen level noted. She continued on nasal cannula O2. Narcan also given with good response patient more alert. Today she is answering questions appropriately and alert. Daughter in the room with her. Slow progress with therapy. Skilled nursing facility placement 1-2 days. Patient off narcotics currently on Tylenol or Ultram. 02 levels at room air 98% today.   Activity level:  Working slow with therapy may be weightbearing as tolerated right side. Diet tolerance:  Light eating Voiding:  Okay Patient reports pain as 2 on 0-10 scale.    Objective: Vital signs in last 24 hours: Temp:  [99.4 F (37.4 C)-100.3 F (37.9 C)] 99.4 F (37.4 C) (11/16 0511) Pulse Rate:  [80-86] 86 (11/16 0511) Resp:  [16-18] 16 (11/16 0511) BP: (114-123)/(51-60) 114/53 mmHg (11/16 0511) SpO2:  [92 %-100 %] 94 % (11/16 0601)  Labs:  Recent Labs  03/14/13 0414 03/15/13 0405  HGB 11.4* 10.9*    Recent Labs  03/14/13 0414 03/15/13 0405  WBC 9.5 12.0*  RBC 3.67* 3.56*  HCT 33.4* 32.5*  PLT 287 244    Recent Labs  03/14/13 0414  NA 139  K 3.8  CL 106  CO2 25  BUN 7  CREATININE 0.77  GLUCOSE 131*  CALCIUM 8.2*   No results found for this basename: LABPT, INR,  in the last 72 hours  Physical Exam:  Neurologically intact ABD soft Neurovascular intact Sensation intact distally Intact pulses distally Dorsiflexion/Plantar flexion intact Incision: dressing C/D/I No cellulitis present Compartment soft Calf soft and nontender negative Homans. Dressing within normal limits. Minimal swelling at thigh  Assessment/Plan:  2 Days Post-Op Procedure(s) (LRB): TOTAL HIP ARTHROPLASTY (Right) Advance diet Up with therapy Discharge to SNF Patient is alert and oriented properly today. Skilled nursing facility and 1-2  days. Continue ASA 325/SCDs. Continue with incentive spirometry We'll continue pain medicine Tylenol/Ultram only. Decreased 02 level as seen on ABG most likely from hypoventilation and over medication   Kha Hari R 03/15/2013, 10:41 AM

## 2013-03-15 NOTE — Progress Notes (Signed)
Physical Therapy Treatment Patient Details Name: [name] MRN: [id number] DOB: 1935-02-22 Today's Date: 03/15/2013 Time: 1147-1209 PT Time Calculation (min): 22 min  PT Assessment / Plan / Recommendation  History of Present Illness Patient is a 78 yo female s/p Rt THA.   PT Comments   Pt. Found in recliner chair , leaning toward L side.  Worked toward pt. Correcting her sitting alignment but she had great difficulty with this and with sitting balance on edge of recliner.  Pt. Needed 2 assist for chair mobility and was unable to progress to stand or ambulate today during PT session today.  She was initially sleepy but did awaken sufficiently that she could feed herself her lunch tray.  Follow Up Recommendations  SNF     Does the patient have the potential to tolerate intense rehabilitation     Barriers to Discharge        Equipment Recommendations  None recommended by PT    Recommendations for Other Services    Frequency 7X/week   Progress towards PT Goals Progress towards PT goals: Not progressing toward goals - comment (pt. with increased difficulty in chair mobility, unable to progress to standing today or for transfers/ambulation  Plan Current plan remains appropriate    Precautions / Restrictions Precautions Precautions: Posterior Hip;Fall Restrictions Weight Bearing Restrictions: Yes RLE Weight Bearing: Weight bearing as tolerated   Pertinent Vitals/Pain See vitals tab     Mobility  Bed Mobility Bed Mobility: Not assessed (up in recliner ) Sitting - Scoot to Edge of Bed: Other (comment);1: +2 Total assist (sitting in recliner chair (not edge of bed)) Sitting - Scoot to Edge of Bed: Patient Percentage: 30% Transfers Transfers: Not assessed (pt. not able) Ambulation/Gait Ambulation/Gait Assistance: Not tested (comment) (pt. not able)    Exercises     PT Diagnosis:    PT Problem List:   PT Treatment Interventions:     PT Goals (current goals can now be  found in the care plan section)    Visit Information  Last PT Received On: 03/15/13 Assistance Needed: +2 PT/OT Co-Evaluation/Treatment: Yes History of Present Illness: Patient is a 78 yo female s/p Rt THA.    Subjective Data  Subjective: Pt. presents sleeping in recliner chair, leaning toward L side.  Once pt. repositioned, she commented "that feels mcuh better"   Cognition  Cognition Arousal/Alertness: Lethargic;Awake/alert (intitially sleepy/lethargic but awake by end of session) Behavior During Therapy: WFL for tasks assessed/performed Overall Cognitive Status: Within Functional Limits for tasks assessed    Balance  Static Sitting Balance Static Sitting - Balance Support: Bilateral upper extremity supported;Feet supported Static Sitting - Level of Assistance: 2: Max assist;3: Mod assist;Other (comment) (initially max, fluctuating to mod) Static Sitting - Comment/# of Minutes: 8  End of Session PT - End of Session Activity Tolerance: Patient limited by fatigue Patient left: in chair;with call bell/phone within reach Nurse Communication: Mobility status;Other (comment);Need for lift equipment;Precautions (need for 2 assist for all transfers, possibly lift equipment)   GP     Ferman Hamming 03/15/2013, 1:46 PM Weldon Picking PT Acute Rehab Services [phone] Beeper [phone]

## 2013-03-15 NOTE — Progress Notes (Signed)
Occupational Therapy Treatment Patient Details Name: [name] MRN: [id number] DOB: 28-Jul-1934 Today's Date: 03/15/2013 Time: 4098-1191 OT Time Calculation (min): 25 min  OT Assessment / Plan / Recommendation  History of present illness Patient is a 78 yo female s/p Rt THA.   OT comments  Focus of session on sitting balance. Pt requires +2 total assist to scoot to edge of chair and presents with heavy posterior lean.   Continue to recommend SNF.  Follow Up Recommendations  SNF;Supervision/Assistance - 24 hour    Barriers to Discharge       Equipment Recommendations  None recommended by OT    Recommendations for Other Services    Frequency Min 2X/week   Progress towards OT Goals    Plan      Precautions / Restrictions Precautions Precautions: Posterior Hip;Fall Restrictions Weight Bearing Restrictions: Yes RLE Weight Bearing: Weight bearing as tolerated   Pertinent Vitals/Pain See vitals    ADL  Eating/Feeding: Performed;Set up Where Assessed - Eating/Feeding: Chair ADL Comments: Upon OT arrival, pt up in chair but leaning heavily to left side (avoiding pressure on right hip). Unable to reposition to center of chair without assist. Pt unable to Focus on static sitting balance edge of chair.  Pt very lethargic at start of session and presenting with heavy posterior lean.  Pt more alert by end of session and set up to eat lunch.  Pt able to feed herself but required assist to remove lids.    OT Diagnosis: Generalized weakness;Acute pain  OT Problem List: Decreased strength;Decreased activity tolerance;Impaired balance (sitting and/or standing);Decreased knowledge of use of DME or AE;Decreased knowledge of precautions;Pain OT Treatment Interventions: Self-care/ADL training;DME and/or AE instruction;Therapeutic activities;Patient/family education;Balance training   OT Goals(current goals can now be found in the care plan section) Acute Rehab OT Goals Patient Stated  Goal: To decrease pain OT Goal Formulation: With patient Time For Goal Achievement: 03/28/13 Potential to Achieve Goals: Good  Visit Information  Last OT Received On: 03/15/13 Assistance Needed: +2 History of Present Illness: Patient is a 78 yo female s/p Rt THA.    Subjective Data      Prior Functioning  Home Living Family/patient expects to be discharged to:: Skilled nursing facility Living Arrangements: Alone    Cognition  Cognition Arousal/Alertness: Lethargic (initially very lethargic but more alert by end of session) Behavior During Therapy: WFL for tasks assessed/performed Overall Cognitive Status: Within Functional Limits for tasks assessed    Mobility  Bed Mobility Bed Mobility: Not assessed Sitting - Scoot to Edge of Bed: Other (comment);1: +2 Total assist (sitting in recliner chair (not edge of bed)) Sitting - Scoot to Edge of Bed: Patient Percentage: 30%    Exercises      Balance Static Sitting Balance Static Sitting - Balance Support: Bilateral upper extremity supported;Feet supported Static Sitting - Level of Assistance: 2: Max assist;3: Mod assist;Other (comment) Static Sitting - Comment/# of Minutes: 8 min   End of Session OT - End of Session Activity Tolerance: Patient limited by lethargy Patient left: in chair;with call bell/phone within reach Nurse Communication: Mobility status  GO    03/15/2013 Cipriano Mile OTR/L Pager [phone] Office [phone]  Cipriano Mile 03/15/2013, 2:55 PM

## 2013-03-16 ENCOUNTER — Encounter (HOSPITAL_COMMUNITY): Payer: Self-pay | Admitting: Orthopedic Surgery

## 2013-03-16 LAB — CBC
HCT: 30 % — ABNORMAL LOW (ref 36.0–46.0)
Hemoglobin: 10.4 g/dL — ABNORMAL LOW (ref 12.0–15.0)
MCH: 31.1 pg (ref 26.0–34.0)
MCV: 89.8 fL (ref 78.0–100.0)
Platelets: 228 10*3/uL (ref 150–400)
RBC: 3.34 MIL/uL — ABNORMAL LOW (ref 3.87–5.11)
RDW: 14 % (ref 11.5–15.5)
WBC: 10.5 10*3/uL (ref 4.0–10.5)

## 2013-03-16 NOTE — Progress Notes (Signed)
PATIENT ID: RAYLEN KEN  MRN: [id number]  DOB/AGE:  [birth year] / 78 y.o.  3 Days Post-Op Procedure(s) (LRB): TOTAL HIP ARTHROPLASTY (Right)    PROGRESS NOTE Subjective: Patient is alert, oriented,no Nausea, no Vomiting, yes passing gas, no Bowel Movement. Taking PO well, pt up eating in room. Denies SOB, Chest or Calf Pain. Using Incentive Spirometer, PAS in place. Ambulate WBAT Patient reports pain as moderate  .    Objective: Vital signs in last 24 hours: Filed Vitals:   03/15/13 1642 03/15/13 1937 03/15/13 2115 03/16/13 0611  BP: 97/44  114/46 111/48  Pulse: 88  91 80  Temp: 98.7 F (37.1 C)  99.4 F (37.4 C) 97.9 F (36.6 C)  TempSrc: Oral  Oral   Resp: 18  18 19   SpO2: 97% 97% 96% 94%      Intake/Output from previous day: I/O last 3 completed shifts: In: 880 [P.O.:880] Out: 600 [Urine:600]   Intake/Output this shift:     LABORATORY DATA:  Recent Labs  03/14/13 0414 03/14/13 1426 03/15/13 0405 03/16/13 0545  WBC 9.5  --  12.0* 10.5  HGB 11.4*  --  10.9* 10.4*  HCT 33.4*  --  32.5* 30.0*  PLT 287  --  244 228  NA 139  --   --   --   K 3.8  --   --   --   CL 106  --   --   --   CO2 25  --   --   --   BUN 7  --   --   --   CREATININE 0.77  --   --   --   GLUCOSE 131*  --   --   --   GLUCAP  --  129*  --   --   CALCIUM 8.2*  --   --   --     Examination: Neurologically intact Neurovascular intact Sensation intact distally Intact pulses distally Dorsiflexion/Plantar flexion intact Incision: no drainage No cellulitis present Compartment soft} XR AP&Lat of hip shows well placed\fixed THA  Assessment:   3 Days Post-Op Procedure(s) (LRB): TOTAL HIP ARTHROPLASTY (Right) ADDITIONAL DIAGNOSIS:    Plan: PT/OT WBAT, THA  posterior precautions  DVT Prophylaxis: SCDx72 hrs, ASA 325 mg BID x 2 weeks  DISCHARGE PLAN: Skilled Nursing Facility/Rehab when cleared by PT.  DISCHARGE NEEDS: HHPT, HHRN, Walker and 3-in-1 comode seat

## 2013-03-16 NOTE — Progress Notes (Signed)
Physical Therapy Treatment Patient Details Name: DEANNIE RESETAR MRN: [id number] DOB: 06-23-1934 Today's Date: 03/16/2013 Time: 6578-4696 PT Time Calculation (min): 26 min  PT Assessment / Plan / Recommendation  History of Present Illness Patient is a 78 yo female s/p Rt THA posterior approach.   PT Comments   Pt progressing towards physical therapy goals. She was able to improve ambulation distance, and AROM during therapeutic exercise. Overall great rehab effort today.   Follow Up Recommendations  SNF     Does the patient have the potential to tolerate intense rehabilitation     Barriers to Discharge        Equipment Recommendations  None recommended by PT    Recommendations for Other Services    Frequency 7X/week   Progress towards PT Goals Progress towards PT goals: Progressing toward goals  Plan Current plan remains appropriate    Precautions / Restrictions Precautions Precautions: Posterior Hip;Fall Precaution Comments: Pt was able to recall 0/3 hip precautions. 3/3 precautions reviewed with pt.  Restrictions Weight Bearing Restrictions: Yes RLE Weight Bearing: Weight bearing as tolerated   Pertinent Vitals/Pain 7/10 after session. Pt called nursing for medication.     Mobility  Bed Mobility Bed Mobility: Sit to Supine;Scooting to HOB Sit to Supine: 3: Mod assist;HOB flat Scooting to HOB: 5: Supervision;5: Set up;With rail Details for Bed Mobility Assistance: Assist to elevate LE's into bed. VC's for technique and sequencing.  Transfers Transfers: Stand to Sit Stand to Sit: 4: Min guard;To bed;With upper extremity assist Details for Transfer Assistance: Good control to bed, VC's for hand placement Ambulation/Gait Ambulation/Gait Assistance: 4: Min guard Ambulation Distance (Feet): 50 Feet Assistive device: Rolling walker Ambulation/Gait Assistance Details: VC's for sequencing and safety awareness with the RW.  Gait Pattern: Step-to pattern;Decreased stride  length Gait velocity: Decreased    Exercises Total Joint Exercises Ankle Circles/Pumps: 15 reps Quad Sets: 15 reps Towel Squeeze: 15 reps Short Arc Quad: 15 reps Heel Slides: 15 reps Hip ABduction/ADduction: 15 reps   PT Diagnosis:    PT Problem List:   PT Treatment Interventions:     PT Goals (current goals can now be found in the care plan section) Acute Rehab PT Goals Patient Stated Goal: To decrease pain PT Goal Formulation: With patient/family Time For Goal Achievement: 03/27/13 Potential to Achieve Goals: Good  Visit Information  Last PT Received On: 03/16/13 Assistance Needed: +1 History of Present Illness: Patient is a 78 yo female s/p Rt THA posterior approach.    Subjective Data  Subjective: Pt standing at recliner with family member when PT entered, stating her bottom was sore from sitting up.  Patient Stated Goal: To decrease pain   Cognition  Cognition Arousal/Alertness: Awake/alert Behavior During Therapy: WFL for tasks assessed/performed Overall Cognitive Status: Within Functional Limits for tasks assessed    Balance  Balance Balance Assessed: Yes Static Sitting Balance Static Sitting - Balance Support: Bilateral upper extremity supported;Feet supported Static Sitting - Level of Assistance: 5: Stand by assistance  End of Session PT - End of Session Equipment Utilized During Treatment: Gait belt Activity Tolerance: Patient tolerated treatment well Patient left: in bed;with call bell/phone within reach;with family/visitor present Nurse Communication: Mobility status   GP     Ruthann Cancer 03/16/2013, 3:15 PM  Ruthann Cancer, PT, DPT [phone]

## 2013-03-16 NOTE — Progress Notes (Signed)
Pt's grand daughter stated that they would like [facility].   Facility would like to speak with the Pt about copays  CSW provided number for Pt room and facility will contact CSW back about d/c plan.    Leron Croak [facility]  4N 1[address]16;  [phone] Phone: [phone]

## 2013-03-16 NOTE — Clinical Documentation Improvement (Signed)
THIS DOCUMENT IS NOT A PERMANENT PART OF THE MEDICAL RECORD  Please update your documentation with the medical record to reflect your response to this query. If you need help knowing how to do this please call [phone].  03/16/13   To [name],  In a better effort to capture your patient's severity of illness, reflect appropriate length of stay and utilization of resources, a review of the patient medical record has revealed the following indicators.    Based on your clinical judgment, please clarify and document in a progress note and/or discharge summary the clinical condition associated with the following supporting information:  In responding to this query please exercise your independent judgment.  The fact that a query is asked, does not imply that any particular answer is desired or expected.  Per Significant event note "On RA 7.38 pCO2 42 pO2 57 NAHCO3 25. Placed on 2 liter. Awakened and encouraged IS. Narcan 0.4 mg IV given per order".  Please document clinical condition if appropriate.  Thank you     Possible Clinical Conditions?  Acute Respiratory Distress  Severe Respiratory Distress following surgery  Acute Respiratory Insufficiency  Acute Respiratory Insufficiency following surgery   Other Condition  Cannot Clinically Determine    Supporting Information: see ABG results above  Risk Factors: s/p THR, smoker  Signs&Symptoms: "quickly falls back asleep" while   "Resps reg and unlabored-shallow when sleeping"   Diagnostics: ABG's noted above     PO2      PCO2     PH     BiCarb     Date of ABG's     O2 concentration:     O2 mode:   Treatment: Narcan IV 0.4mg   Oxygen: 4L [place] applied decr to 2 L [place] after Narcan  O2 Mode: ([place], mask, Ventimask, Non rebreather mask, BiPAP, Vent)   You may use possible, probable, or suspect with inpatient documentation. possible, probable, suspected diagnoses MUST be documented at the time of  discharge  Reviewed: ok .done  Thank Arrie [facility]  Clinical Documentation Specialist: [phone]

## 2013-03-16 NOTE — Discharge Summary (Deleted)
Patient ID: [name] MRN: [id number] DOB/AGE: [birth year] 78 y.o.  Admit date: 03/13/2013 Discharge date: 03/16/2013  Admission Diagnoses:  Principal Problem:   Osteoarthritis of right hip   Discharge Diagnoses:  Same  Past Medical History  Diagnosis Date  . Fluid retention   . Hypertension   . High cholesterol   . Depression   . Allergic rhinitis   . GERD (gastroesophageal reflux disease)   . Shortness of breath     with exertion  . Pelvic pain   . Arthritis     back  . History of blood transfusion   . Left bundle branch block     diagnosed '08; normal coronaries by cath 2010    Surgeries: Procedure(s): TOTAL HIP ARTHROPLASTY on 03/13/2013   Consultants:    Discharged Condition: Improved  Hospital Course: BAILLIE MOHAMMAD is an 78 y.o. female who was admitted 03/13/2013 for operative treatment ofOsteoarthritis of right hip. Patient has severe unremitting pain that affects sleep, daily activities, and work/hobbies. After pre-op clearance the patient was taken to the operating room on 03/13/2013 and underwent  Procedure(s): TOTAL HIP ARTHROPLASTY.    Patient was given perioperative antibiotics: Anti-infectives   Start     Dose/Rate Route Frequency Ordered Stop   03/13/13 0600  vancomycin (VANCOCIN) IVPB 1000 mg/200 mL premix     1,000 mg 200 mL/hr over 60 Minutes Intravenous On call to O.R. 03/12/13 1417 03/13/13 1404       Patient was given sequential compression devices, early ambulation, and chemoprophylaxis to prevent DVT.  Patient benefited maximally from hospital stay and there were no complications.    Recent vital signs: Patient Vitals for the past 24 hrs:  BP Temp Temp src Pulse Resp SpO2  03/16/13 0611 111/48 mmHg 97.9 F (36.6 C) - 80 19 94 %  03/15/13 2115 114/46 mmHg 99.4 F (37.4 C) Oral 91 18 96 %  03/15/13 1937 - - - - - 97 %  03/15/13 1642 97/44 mmHg 98.7 F (37.1 C) Oral 88 18 97 %     Recent laboratory studies:  Recent Labs   03/14/13 0414 03/15/13 0405 03/16/13 0545  WBC 9.5 12.0* 10.5  HGB 11.4* 10.9* 10.4*  HCT 33.4* 32.5* 30.0*  PLT 287 244 228  NA 139  --   --   K 3.8  --   --   CL 106  --   --   CO2 25  --   --   BUN 7  --   --   CREATININE 0.77  --   --   GLUCOSE 131*  --   --   CALCIUM 8.2*  --   --      Discharge Medications:     Medication List    STOP taking these medications       HYDROcodone-acetaminophen 5-325 MG per tablet  Commonly known as:  NORCO/VICODIN      TAKE these medications       ALPRAZolam 0.5 MG tablet  Commonly known as:  XANAX  Take 0.5 mg by mouth 2 (two) times daily as needed for anxiety.     amitriptyline 25 MG tablet  Commonly known as:  ELAVIL  Take 1 tablet (25 mg total) by mouth at bedtime.     aspirin EC 325 MG tablet  Take 1 tablet (325 mg total) by mouth 2 (two) times daily.     budesonide-formoterol 160-4.5 MCG/ACT inhaler  Commonly known as:  SYMBICORT  Inhale 1 puff  into the lungs 2 (two) times daily.     buPROPion 150 MG 12 hr tablet  Commonly known as:  WELLBUTRIN SR  Take 1 tablet (150 mg total) by mouth 2 (two) times daily.     cetirizine 10 MG tablet  Commonly known as:  ZYRTEC  Take 1 tablet (10 mg total) by mouth daily.     cholecalciferol 1000 UNITS tablet  Commonly known as:  VITAMIN D  Take 1,000 Units by mouth daily.     esomeprazole 40 MG capsule  Commonly known as:  NEXIUM  Take 1 capsule (40 mg total) by mouth daily before breakfast.     losartan 50 MG tablet  Commonly known as:  COZAAR  Take 1 tablet (50 mg total) by mouth daily.     multivitamin with minerals Tabs tablet  Take 1 tablet by mouth every morning.     oxyCODONE-acetaminophen 10-325 MG per tablet  Commonly known as:  PERCOCET  Take 1 tablet by mouth every 6 (six) hours as needed for pain.     predniSONE 20 MG tablet  Commonly known as:  DELTASONE  Take 2 tablets (40 mg total) by mouth daily.     raloxifene 60 MG tablet  Commonly known as:   EVISTA  Take 1 tablet (60 mg total) by mouth daily.     simvastatin 10 MG tablet  Commonly known as:  ZOCOR  Take 1 tablet (10 mg total) by mouth at bedtime.     tizanidine 2 MG capsule  Commonly known as:  ZANAFLEX  Take 1 capsule (2 mg total) by mouth 3 (three) times daily.        Diagnostic Studies: Dg Chest 2 View  03/05/2013   CLINICAL DATA:  Preop.  EXAM: CHEST  2 VIEW  COMPARISON:  Chest CT, 09/10/2012 and chest radiograph, 06/06/2012.  FINDINGS: Borderline enlargement of the cardiac silhouette. Mediastinum normal in contour. No hilar masses. Clear lungs. No pleural effusion or pneumothorax.  Bony thorax is intact.  IMPRESSION: No active cardiopulmonary disease.   Electronically Signed   By: Amie Portland M.D.   On: 03/05/2013 13:19   Dg Pelvis Portable  03/13/2013   CLINICAL DATA:  Postop right hip replacement  EXAM: PORTABLE PELVIS 1-2 VIEWS  COMPARISON:  10/22/2012  FINDINGS: Right hip replacement in satisfactory position and alignment. Negative for fracture. No acute complication.  IMPRESSION: Satisfactory right hip replacement.   Electronically Signed   By: Marlan Palau M.D.   On: 03/13/2013 16:59    Disposition: 01-Home or Self Care      Discharge Orders   Future Appointments Provider Department Dept Phone   05/04/2013 10:30 AM Coralie Keens, FNP [facility] [phone]   Future Orders Complete By Expires   Call MD / Call 911  As directed    Comments:     If you experience chest pain or shortness of breath, CALL 911 and be transported to the hospital emergency room.  If you develope a fever above 101 F, pus (white drainage) or increased drainage or redness at the wound, or calf pain, call your surgeon's office.   Change dressing  As directed    Comments:     You may change your dressing on day 5, then change the dressing daily with sterile 4 x 4 inch gauze dressing and paper tape.  You may clean the incision with alcohol prior to redressing    Constipation Prevention  As directed    Comments:  Drink plenty of fluids.  Prune juice may be helpful.  You may use a stool softener, such as Colace (over the counter) 100 mg twice a day.  Use MiraLax (over the counter) for constipation as needed.   Diet - low sodium heart healthy  As directed    Discharge instructions  As directed    Comments:     Follow up in 2 weeks in office with Dr. Turner Daniels   Driving restrictions  As directed    Comments:     No driving for 2 weeks   Follow the hip precautions as taught in Physical Therapy  As directed    Increase activity slowly as tolerated  As directed    Patient may shower  As directed    Comments:     You may shower without a dressing once there is no drainage.  Do not wash over the wound.  If drainage remains, cover wound with plastic wrap and then shower.      Follow-up Information   Follow up with Nestor Lewandowsky, MD In 2 weeks.   Specialty:  Orthopedic Surgery   Contact information:   [address] [place] [address] [phone]        Signed: Henry Russel 03/16/2013, 7:35 AM

## 2013-03-16 NOTE — Progress Notes (Signed)
UR COMPLETED  

## 2013-03-17 ENCOUNTER — Inpatient Hospital Stay (HOSPITAL_COMMUNITY): Payer: Medicare Other

## 2013-03-17 MED ORDER — BISACODYL 10 MG RE SUPP
10.0000 mg | Freq: Every day | RECTAL | Status: DC | PRN
  Administered 2013-03-17: 10 mg via RECTAL
  Filled 2013-03-17: qty 1

## 2013-03-17 MED ORDER — INDOMETHACIN 50 MG PO CAPS
50.0000 mg | ORAL_CAPSULE | Freq: Two times a day (BID) | ORAL | Status: DC
  Filled 2013-03-17 (×2): qty 1

## 2013-03-17 MED ORDER — INDOMETHACIN 50 MG PO CAPS: 50.0000 mg | ORAL_CAPSULE | Freq: Two times a day (BID) | ORAL | Status: DC

## 2013-03-17 NOTE — Progress Notes (Signed)
Clinical social worker assisted with patient discharge to skilled nursing facility, [facility].  CSW addressed all family questions and concerns. CSW copied chart and added all important documents. CSW also set up patient transportation with [facility]. Clinical Social Worker will sign off for now as social work intervention is no longer needed.   Aikam Hellickson, MSW, LCSWA [phone] 

## 2013-03-17 NOTE — Progress Notes (Signed)
Patient ID: [name], female   DOB: 07/25/1934, 78 y.o.   MRN: [id number] PATIENT ID: [name]  MRN: [id number]  DOB/AGE:  Dec 11, 1934 / 78 y.o.  4 Days Post-Op Procedure(s) (LRB): TOTAL HIP ARTHROPLASTY (Right)    PROGRESS NOTE Subjective: Patient is alert, oriented,no Nausea, no Vomiting, yes passing gas, yes Bowel Movement. Taking PO well. Denies SOB, Chest or Calf Pain. Using Incentive Spirometer, PAS in place. Ambulate weight bearing as tolerated over 100 feet Patient reports pain as 3 on 0-10 scale  .    Objective: Vital signs in last 24 hours: Filed Vitals:   03/16/13 1134 03/16/13 1600 03/16/13 2139 03/17/13 0700  BP:   119/66 120/85  Pulse:   79 89  Temp:   98.2 F (36.8 C) 98.4 F (36.9 C)  TempSrc:    Oral  Resp: 18 18 18 18   SpO2:   95% 97%      Intake/Output from previous day: I/O last 3 completed shifts: In: 360 [P.O.:360] Out: 250 [Urine:250]   Intake/Output this shift:     LABORATORY DATA:  Recent Labs  03/14/13 1426 03/15/13 0405 03/16/13 0545  WBC  --  12.0* 10.5  HGB  --  10.9* 10.4*  HCT  --  32.5* 30.0*  PLT  --  244 228  GLUCAP 129*  --   --     Examination: Neurologically intact ABD soft Neurovascular intact Sensation intact distally Intact pulses distally Dorsiflexion/Plantar flexion intact Incision: no drainage No cellulitis present Compartment soft} XR AP&Lat of hip shows well placed\fixed THA, tap is negative no pain with axial loading of right lower trembly  Assessment:   4 Days Post-Op Procedure(s) (LRB): TOTAL HIP ARTHROPLASTY (Right) ADDITIONAL DIAGNOSIS:  Hypertension  Plan: PT/OT WBAT, THA  posterior precautions  DVT Prophylaxis: SCDx72 hrs, ASA 325 mg BID x 2 weeks  DISCHARGE PLAN: Skilled Nursing Facility/[place]hab,[facility] [place]  DISCHARGE NEEDS: HHPT, HHRN, CPM, Walker and 3-in-1 comode seat

## 2013-03-17 NOTE — Discharge Summary (Signed)
Patient ID: [name] MRN: [id number] DOB/AGE: 06/14/1934 78 y.o.  Admit date: 03/13/2013 Discharge date: 03/17/2013  Admission Diagnoses:  Principal Problem:   Osteoarthritis of right hip   Discharge Diagnoses:  Same  Past Medical History  Diagnosis Date  . Fluid retention   . Hypertension   . High cholesterol   . Depression   . Allergic rhinitis   . GERD (gastroesophageal reflux disease)   . Shortness of breath     with exertion  . Pelvic pain   . Arthritis     back  . History of blood transfusion   . Left bundle branch block     diagnosed '08; normal coronaries by cath 2010    Surgeries: Procedure(s): TOTAL HIP ARTHROPLASTY on 03/13/2013   Consultants:    Discharged Condition: Improved  Hospital Course: [name] is an 78 y.o. female who was admitted 03/13/2013 for operative treatment ofOsteoarthritis of right hip. Patient has severe unremitting pain that affects sleep, daily activities, and work/hobbies. After pre-op clearance the patient was taken to the operating room on 03/13/2013 and underwent  Procedure(s): TOTAL HIP ARTHROPLASTY.    Patient was given perioperative antibiotics: Anti-infectives   Start     Dose/Rate Route Frequency Ordered Stop   03/13/13 0600  vancomycin (VANCOCIN) IVPB 1000 mg/200 mL premix     1,000 mg 200 mL/hr over 60 Minutes Intravenous On call to O.R. 03/12/13 1417 03/13/13 1404       Patient was given sequential compression devices, early ambulation, and chemoprophylaxis to prevent DVT.  Patient benefited maximally from hospital stay and there were no complications.    Recent vital signs: Patient Vitals for the past 24 hrs:  BP Temp Temp src Pulse Resp SpO2  03/17/13 0800 - - - - 18 -  03/17/13 0700 120/85 mmHg 98.4 F (36.9 C) Oral 89 18 97 %  03/16/13 2139 119/66 mmHg 98.2 F (36.8 C) - 79 18 95 %  03/16/13 1600 - - - - 18 -  03/16/13 1134 - - - - 18 -     Recent laboratory studies:  Recent Labs   03/15/13 0405 03/16/13 0545  WBC 12.0* 10.5  HGB 10.9* 10.4*  HCT 32.5* 30.0*  PLT 244 228     Discharge Medications:     Medication List    STOP taking these medications       HYDROcodone-acetaminophen 5-325 MG per tablet  Commonly known as:  NORCO/VICODIN      TAKE these medications       ALPRAZolam 0.5 MG tablet  Commonly known as:  XANAX  Take 0.5 mg by mouth 2 (two) times daily as needed for anxiety.     amitriptyline 25 MG tablet  Commonly known as:  ELAVIL  Take 1 tablet (25 mg total) by mouth at bedtime.     aspirin EC 325 MG tablet  Take 1 tablet (325 mg total) by mouth 2 (two) times daily.     budesonide-formoterol 160-4.5 MCG/ACT inhaler  Commonly known as:  SYMBICORT  Inhale 1 puff into the lungs 2 (two) times daily.     buPROPion 150 MG 12 hr tablet  Commonly known as:  WELLBUTRIN SR  Take 1 tablet (150 mg total) by mouth 2 (two) times daily.     cetirizine 10 MG tablet  Commonly known as:  ZYRTEC  Take 1 tablet (10 mg total) by mouth daily.     cholecalciferol 1000 UNITS tablet  Commonly known as:  VITAMIN  D  Take 1,000 Units by mouth daily.     esomeprazole 40 MG capsule  Commonly known as:  NEXIUM  Take 1 capsule (40 mg total) by mouth daily before breakfast.     indomethacin 50 MG capsule  Commonly known as:  INDOCIN  Take 1 capsule (50 mg total) by mouth 2 (two) times daily with a meal.     losartan 50 MG tablet  Commonly known as:  COZAAR  Take 1 tablet (50 mg total) by mouth daily.     multivitamin with minerals Tabs tablet  Take 1 tablet by mouth every morning.     oxyCODONE-acetaminophen 10-325 MG per tablet  Commonly known as:  PERCOCET  Take 1 tablet by mouth every 6 (six) hours as needed for pain.     predniSONE 20 MG tablet  Commonly known as:  DELTASONE  Take 2 tablets (40 mg total) by mouth daily.     raloxifene 60 MG tablet  Commonly known as:  EVISTA  Take 1 tablet (60 mg total) by mouth daily.     simvastatin  10 MG tablet  Commonly known as:  ZOCOR  Take 1 tablet (10 mg total) by mouth at bedtime.     tizanidine 2 MG capsule  Commonly known as:  ZANAFLEX  Take 1 capsule (2 mg total) by mouth 3 (three) times daily.        Diagnostic Studies: Dg Chest 2 View  03/05/2013   CLINICAL DATA:  Preop.  EXAM: CHEST  2 VIEW  COMPARISON:  Chest CT, 09/10/2012 and chest radiograph, 06/06/2012.  FINDINGS: Borderline enlargement of the cardiac silhouette. Mediastinum normal in contour. No hilar masses. Clear lungs. No pleural effusion or pneumothorax.  Bony thorax is intact.  IMPRESSION: No active cardiopulmonary disease.   Electronically Signed   By: Amie Portland M.D.   On: 03/05/2013 13:19   Dg Pelvis Portable  03/13/2013   CLINICAL DATA:  Postop right hip replacement  EXAM: PORTABLE PELVIS 1-2 VIEWS  COMPARISON:  10/22/2012  FINDINGS: Right hip replacement in satisfactory position and alignment. Negative for fracture. No acute complication.  IMPRESSION: Satisfactory right hip replacement.   Electronically Signed   By: Marlan Palau M.D.   On: 03/13/2013 16:59   Dg Ankle Right Port  03/17/2013   CLINICAL DATA:  78 year old female right ankle pain and swelling with no known injury. Initial encounter.  EXAM: PORTABLE RIGHT ANKLE - 2 VIEW  COMPARISON:  None.  FINDINGS: Portable views. No definite joint effusion. Calcaneus appears intact. There is abnormal bone mineralization at the medial talar dome. Mortise joint alignment preserved. Distal tibia and fibula appear intact.  IMPRESSION: Abnormal bone mineralization at the medial talar dome may indicate osteochondral disease or perhaps AVN. Otherwise no acute osseous abnormality at the right ankle.   Electronically Signed   By: Augusto Gamble M.D.   On: 03/17/2013 10:03    Disposition: 01-Home or Self Care      Discharge Orders   Future Appointments Provider Department Dept Phone   05/04/2013 10:30 AM Coralie Keens, FNP [facility]  [phone]   Future Orders Complete By Expires   Call MD / Call 911  As directed    Comments:     If you experience chest pain or shortness of breath, CALL 911 and be transported to the hospital emergency room.  If you develope a fever above 101 F, pus (white drainage) or increased drainage or redness at the wound, or calf  pain, call your surgeon's office.   Change dressing  As directed    Comments:     You may change your dressing on day 5, then change the dressing daily with sterile 4 x 4 inch gauze dressing and paper tape.  You may clean the incision with alcohol prior to redressing   Constipation Prevention  As directed    Comments:     Drink plenty of fluids.  Prune juice may be helpful.  You may use a stool softener, such as Colace (over the counter) 100 mg twice a day.  Use MiraLax (over the counter) for constipation as needed.   Diet - low sodium heart healthy  As directed    Discharge instructions  As directed    Comments:     Follow up in 2 weeks in office with Dr. Turner Daniels   Driving restrictions  As directed    Comments:     No driving for 2 weeks   Follow the hip precautions as taught in Physical Therapy  As directed    Increase activity slowly as tolerated  As directed    Patient may shower  As directed    Comments:     You may shower without a dressing once there is no drainage.  Do not wash over the wound.  If drainage remains, cover wound with plastic wrap and then shower.      Follow-up Information   Follow up with Nestor Lewandowsky, MD In 2 weeks.   Specialty:  Orthopedic Surgery   Contact information:   [address] [place] [address] [phone]        Signed: Vear Clock Teria Khachatryan R 03/17/2013, 11:17 AM

## 2013-03-17 NOTE — Progress Notes (Signed)
Pt complained of continued ankle pain.  X-rays obtained showing no acute injury.  Patient was started on Indocin 50 mg twice daily with food for suspected gout.  Patient is to continue on this medication as needed.  A prescription is supplied for discharge.

## 2013-04-07 ENCOUNTER — Other Ambulatory Visit: Payer: Self-pay | Admitting: General Practice

## 2013-04-07 ENCOUNTER — Telehealth: Payer: Self-pay | Admitting: General Practice

## 2013-04-07 DIAGNOSIS — N390 Urinary tract infection, site not specified: Secondary | ICD-10-CM

## 2013-04-07 MED ORDER — NITROFURANTOIN MONOHYD MACRO 100 MG PO CAPS: 100.0000 mg | ORAL_CAPSULE | Freq: Two times a day (BID) | ORAL | Status: DC

## 2013-04-07 NOTE — Telephone Encounter (Signed)
Script sent to pharmacy.

## 2013-04-07 NOTE — Telephone Encounter (Signed)
Please advise 

## 2013-04-15 ENCOUNTER — Encounter: Payer: Self-pay | Admitting: General Practice

## 2013-04-15 ENCOUNTER — Ambulatory Visit (INDEPENDENT_AMBULATORY_CARE_PROVIDER_SITE_OTHER): Payer: Medicare Other | Admitting: General Practice

## 2013-04-15 VITALS — BP 131/66 | HR 93 | Temp 97.2°F | Ht 62.0 in | Wt 149.0 lb

## 2013-04-15 DIAGNOSIS — Z09 Encounter for follow-up examination after completed treatment for conditions other than malignant neoplasm: Secondary | ICD-10-CM

## 2013-04-15 DIAGNOSIS — M109 Gout, unspecified: Secondary | ICD-10-CM

## 2013-04-15 DIAGNOSIS — Z96649 Presence of unspecified artificial hip joint: Secondary | ICD-10-CM

## 2013-04-15 MED ORDER — COLCHICINE 0.6 MG PO TABS: ORAL_TABLET | ORAL | Status: DC

## 2013-04-15 NOTE — Progress Notes (Signed)
   Subjective:    Patient ID: [name], female    DOB: 1934-07-26, 78 y.o.   MRN: [id number]  HPI Patient presents today for hospital follow up. She had total right hip replacement on 03/13/13 and spent two weeks in rehabilitation. Patient verbalize she is currently receiving physical therapy one day a week in her home and progressing well. Twanna verbalized having some gout pain in her right foot, that has been present for two day. Denies taking medications at this time and she has a history of gout. Denies any other complaints.    Review of Systems  Constitutional: Negative for fever and chills.  Respiratory: Negative for chest tightness and shortness of breath.   Cardiovascular: Negative for chest pain and palpitations.  Gastrointestinal: Negative for abdominal pain, diarrhea, constipation and blood in stool.  Genitourinary: Negative for dysuria and difficulty urinating.  Musculoskeletal:       Right hip discomfort at times, due to recent surgery. Right foot great toe and arch area tenderness  Neurological: Negative for dizziness, weakness and headaches.       Objective:   Physical Exam  Constitutional: She is oriented to person, place, and time. She appears well-developed and well-nourished.  HENT:  Head: Normocephalic and atraumatic.  Neck: Normal range of motion. Neck supple. No thyromegaly present.  Cardiovascular: Normal rate, regular rhythm and normal heart sounds.   Pulses:      Dorsalis pedis pulses are 1+ on the right side, and 1+ on the left side.  Pulmonary/Chest: Effort normal and breath sounds normal. No respiratory distress. She exhibits no tenderness.  Abdominal: Soft. Bowel sounds are normal. She exhibits no distension. There is no tenderness.  Musculoskeletal: She exhibits tenderness.  Tenderness noted to plantar right great toe area and arch. Negative discoloration. Capillary refill less than 3 seconds.   Lymphadenopathy:    She has no cervical adenopathy.   Neurological: She is alert and oriented to person, place, and time.  Skin: Skin is warm and dry.  Psychiatric: She has a normal mood and affect.          Assessment & Plan:  1. Hospital discharge follow-up, 2. S/P total hip arthroplasty -maintain scheduled follow up appointments with surgeon  3. Gout  - colchicine 0.6 MG tablet; Take 1.2 mg (2 tablets) initially, then 0.6mg  (1 tablet) one hour later, wait 12 hours then take 0.6mg  (1 tablet) twice daily.  Dispense: 60 tablet; Refill: 0 -Patient verbalized having indomethacin  -RTO if symptoms worsen or unresolved -may seek emergency medical treatment -Patient to schedule appointment for chronic health follow up -Patient verbalized understanding Coralie Keens, FNP-C

## 2013-04-15 NOTE — Patient Instructions (Addendum)
Fall Prevention and Home Safety Falls cause injuries and can affect all age groups. It is possible to use preventive measures to significantly decrease the likelihood of falls. There are many simple measures which can make your home safer and prevent falls. OUTDOORS  Repair cracks and edges of walkways and driveways.  Remove high doorway thresholds.  Trim shrubbery on the main path into your home.  Have good outside lighting.  Clear walkways of tools, rocks, debris, and clutter.  Check that handrails are not broken and are securely fastened. Both sides of steps should have handrails.  Have leaves, snow, and ice cleared regularly.  Use sand or salt on walkways during winter months.  In the garage, clean up grease or oil spills. BATHROOM  Install night lights.  Install grab bars by the toilet and in the tub and shower.  Use non-skid mats or decals in the tub or shower.  Place a plastic non-slip stool in the shower to sit on, if needed.  Keep floors dry and clean up all water on the floor immediately.  Remove soap buildup in the tub or shower on a regular basis.  Secure bath mats with non-slip, double-sided rug tape.  Remove throw rugs and tripping hazards from the floors. BEDROOMS  Install night lights.  Make sure a bedside light is easy to reach.  Do not use oversized bedding.  Keep a telephone by your bedside.  Have a firm chair with side arms to use for getting dressed.  Remove throw rugs and tripping hazards from the floor. KITCHEN  Keep handles on pots and pans turned toward the center of the stove. Use back burners when possible.  Clean up spills quickly and allow time for drying.  Avoid walking on wet floors.  Avoid hot utensils and knives.  Position shelves so they are not too high or low.  Place commonly used objects within easy reach.  If necessary, use a sturdy step stool with a grab bar when reaching.  Keep electrical cables out of the  way.  Do not use floor polish or wax that makes floors slippery. If you must use wax, use non-skid floor wax.  Remove throw rugs and tripping hazards from the floor. STAIRWAYS  Never leave objects on stairs.  Place handrails on both sides of stairways and use them. Fix any loose handrails. Make sure handrails on both sides of the stairways are as long as the stairs.  Check carpeting to make sure it is firmly attached along stairs. Make repairs to worn or loose carpet promptly.  Avoid placing throw rugs at the top or bottom of stairways, or properly secure the rug with carpet tape to prevent slippage. Get rid of throw rugs, if possible.  Have an electrician put in a light switch at the top and bottom of the stairs. OTHER FALL PREVENTION TIPS  Wear low-heel or rubber-soled shoes that are supportive and fit well. Wear closed toe shoes.  When using a stepladder, make sure it is fully opened and both spreaders are firmly locked. Do not climb a closed stepladder.  Add color or contrast paint or tape to grab bars and handrails in your home. Place contrasting color strips on first and last steps.  Learn and use mobility aids as needed. Install an electrical emergency response system.  Turn on lights to avoid dark areas. Replace light bulbs that burn out immediately. Get light switches that glow.  Arrange furniture to create clear pathways. Keep furniture in the same place.    Firmly attach carpet with non-skid or double-sided tape.  Eliminate uneven floor surfaces.  Select a carpet pattern that does not visually hide the edge of steps.  Be aware of all pets. OTHER HOME SAFETY TIPS  Set the water temperature for 120 F (48.8 C).  Keep emergency numbers on or near the telephone.  Keep smoke detectors on every level of the home and near sleeping areas. Document Released: 04/06/2002 Document Revised: 10/16/2011 Document Reviewed: 07/06/2011 [facility] Patient Information 2014  [place], [place].  Gout Gout is an inflammatory arthritis caused by a buildup of uric acid crystals in the joints. Uric acid is a chemical that is normally present in the blood. When the level of uric acid in the blood is too high it can form crystals that deposit in your joints and tissues. This causes joint redness, soreness, and swelling (inflammation). Repeat attacks are common. Over time, uric acid crystals can form into masses (tophi) near a joint, destroying bone and causing disfigurement. Gout is treatable and often preventable. CAUSES  The disease begins with elevated levels of uric acid in the blood. Uric acid is produced by your body when it breaks down a naturally found substance called purines. Certain foods you eat, such as meats and fish, contain high amounts of purines. Causes of an elevated uric acid level include:  Being passed down from parent to child (heredity).  Diseases that cause increased uric acid production (such as obesity, psoriasis, and certain cancers).  Excessive alcohol use.  Diet, especially diets rich in meat and seafood.  Medicines, including certain cancer-fighting medicines (chemotherapy), water pills (diuretics), and aspirin.  Chronic kidney disease. The kidneys are no longer able to remove uric acid well.  Problems with metabolism. Conditions strongly associated with gout include:  Obesity.  High blood pressure.  High cholesterol.  Diabetes. Not everyone with elevated uric acid levels gets gout. It is not understood why some people get gout and others do not. Surgery, joint injury, and eating too much of certain foods are some of the factors that can lead to gout attacks. SYMPTOMS   An attack of gout comes on quickly. It causes intense pain with redness, swelling, and warmth in a joint.  Fever can occur.  Often, only one joint is involved. Certain joints are more commonly involved:  Base of the big  toe.  Knee.  Ankle.  Wrist.  Finger. Without treatment, an attack usually goes away in a few days to weeks. Between attacks, you usually will not have symptoms, which is different from many other forms of arthritis. DIAGNOSIS  Your caregiver will suspect gout based on your symptoms and exam. In some cases, tests may be recommended. The tests may include:  Blood tests.  Urine tests.  X-rays.  Joint fluid exam. This exam requires a needle to remove fluid from the joint (arthrocentesis). Using a microscope, gout is confirmed when uric acid crystals are seen in the joint fluid. TREATMENT  There are two phases to gout treatment: treating the sudden onset (acute) attack and preventing attacks (prophylaxis).  Treatment of an Acute Attack.  Medicines are used. These include anti-inflammatory medicines or steroid medicines.  An injection of steroid medicine into the affected joint is sometimes necessary.  The painful joint is rested. Movement can worsen the arthritis.  You may use warm or cold treatments on painful joints, depending which works best for you.  Treatment to Prevent Attacks.  If you suffer from frequent gout attacks, your caregiver may advise preventive medicine.  These medicines are started after the acute attack subsides. These medicines either help your kidneys eliminate uric acid from your body or decrease your uric acid production. You may need to stay on these medicines for a very long time.  The early phase of treatment with preventive medicine can be associated with an increase in acute gout attacks. For this reason, during the first few months of treatment, your caregiver may also advise you to take medicines usually used for acute gout treatment. Be sure you understand your caregiver's directions. Your caregiver may make several adjustments to your medicine dose before these medicines are effective.  Discuss dietary treatment with your caregiver or dietitian.  Alcohol and drinks high in sugar and fructose and foods such as meat, poultry, and seafood can increase uric acid levels. Your caregiver or dietician can advise you on drinks and foods that should be limited. HOME CARE INSTRUCTIONS   Do not take aspirin to relieve pain. This raises uric acid levels.  Only take over-the-counter or prescription medicines for pain, discomfort, or fever as directed by your caregiver.  Rest the joint as much as possible. When in bed, keep sheets and blankets off painful areas.  Keep the affected joint raised (elevated).  Apply warm or cold treatments to painful joints. Use of warm or cold treatments depends on which works best for you.  Use crutches if the painful joint is in your leg.  Drink enough fluids to keep your urine clear or pale yellow. This helps your body get rid of uric acid. Limit alcohol, sugary drinks, and fructose drinks.  Follow your dietary instructions. Pay careful attention to the amount of protein you eat. Your daily diet should emphasize fruits, vegetables, whole grains, and fat-free or low-fat milk products. Discuss the use of coffee, vitamin C, and cherries with your caregiver or dietician. These may be helpful in lowering uric acid levels.  Maintain a healthy body weight. SEEK MEDICAL CARE IF:   You develop diarrhea, vomiting, or any side effects from medicines.  You do not feel better in 24 hours, or you are getting worse. SEEK IMMEDIATE MEDICAL CARE IF:   Your joint becomes suddenly more tender, and you have chills or a fever. MAKE SURE YOU:   Understand these instructions.  Will watch your condition.  Will get help right away if you are not doing well or get worse. Document Released: 04/13/2000 Document Revised: 08/11/2012 Document Reviewed: 11/28/2011 [facility] Patient Information 2014 [place], [place].

## 2013-05-04 ENCOUNTER — Ambulatory Visit: Payer: Medicare Other | Admitting: General Practice

## 2013-05-18 ENCOUNTER — Ambulatory Visit (INDEPENDENT_AMBULATORY_CARE_PROVIDER_SITE_OTHER): Payer: Medicare Other | Admitting: General Practice

## 2013-05-18 ENCOUNTER — Encounter: Payer: Self-pay | Admitting: General Practice

## 2013-05-18 ENCOUNTER — Ambulatory Visit (INDEPENDENT_AMBULATORY_CARE_PROVIDER_SITE_OTHER): Payer: Medicare Other

## 2013-05-18 VITALS — BP 182/93 | HR 90 | Temp 97.8°F | Ht 62.0 in | Wt 148.0 lb

## 2013-05-18 DIAGNOSIS — Z87891 Personal history of nicotine dependence: Secondary | ICD-10-CM

## 2013-05-18 DIAGNOSIS — R109 Unspecified abdominal pain: Secondary | ICD-10-CM

## 2013-05-18 DIAGNOSIS — M810 Age-related osteoporosis without current pathological fracture: Secondary | ICD-10-CM

## 2013-05-18 DIAGNOSIS — R102 Pelvic and perineal pain: Secondary | ICD-10-CM

## 2013-05-18 DIAGNOSIS — R5383 Other fatigue: Secondary | ICD-10-CM

## 2013-05-18 DIAGNOSIS — I10 Essential (primary) hypertension: Secondary | ICD-10-CM

## 2013-05-18 DIAGNOSIS — E785 Hyperlipidemia, unspecified: Secondary | ICD-10-CM

## 2013-05-18 DIAGNOSIS — R5381 Other malaise: Secondary | ICD-10-CM

## 2013-05-18 DIAGNOSIS — J45909 Unspecified asthma, uncomplicated: Secondary | ICD-10-CM

## 2013-05-18 DIAGNOSIS — F411 Generalized anxiety disorder: Secondary | ICD-10-CM

## 2013-05-18 DIAGNOSIS — K219 Gastro-esophageal reflux disease without esophagitis: Secondary | ICD-10-CM

## 2013-05-18 DIAGNOSIS — G4700 Insomnia, unspecified: Secondary | ICD-10-CM

## 2013-05-18 LAB — POCT UA - MICROSCOPIC ONLY
Bacteria, U Microscopic: NEGATIVE
CRYSTALS, UR, HPF, POC: NEGATIVE
Casts, Ur, LPF, POC: NEGATIVE
Mucus, UA: NEGATIVE
RBC, URINE, MICROSCOPIC: NEGATIVE
WBC, UR, HPF, POC: NEGATIVE
Yeast, UA: NEGATIVE

## 2013-05-18 LAB — POCT URINALYSIS DIPSTICK
Bilirubin, UA: NEGATIVE
Blood, UA: NEGATIVE
Glucose, UA: NEGATIVE
KETONES UA: NEGATIVE
LEUKOCYTES UA: NEGATIVE
Nitrite, UA: NEGATIVE
PROTEIN UA: NEGATIVE
Spec Grav, UA: 1.01
Urobilinogen, UA: NEGATIVE
pH, UA: 7.5

## 2013-05-18 MED ORDER — BUPROPION HCL ER (SR) 150 MG PO TB12: 150.0000 mg | ORAL_TABLET | Freq: Two times a day (BID) | ORAL | Status: DC

## 2013-05-18 MED ORDER — SIMVASTATIN 10 MG PO TABS: 10.0000 mg | ORAL_TABLET | Freq: Every day | ORAL | Status: DC

## 2013-05-18 MED ORDER — ESOMEPRAZOLE MAGNESIUM 40 MG PO CPDR: 40.0000 mg | DELAYED_RELEASE_CAPSULE | Freq: Every day | ORAL | Status: DC

## 2013-05-18 MED ORDER — ALPRAZOLAM 0.5 MG PO TABS: 0.5000 mg | ORAL_TABLET | Freq: Every day | ORAL | Status: DC | PRN

## 2013-05-18 MED ORDER — AMITRIPTYLINE HCL 25 MG PO TABS: 25.0000 mg | ORAL_TABLET | Freq: Every day | ORAL | Status: DC

## 2013-05-18 MED ORDER — RALOXIFENE HCL 60 MG PO TABS: 60.0000 mg | ORAL_TABLET | Freq: Every day | ORAL | Status: DC

## 2013-05-18 MED ORDER — BUDESONIDE-FORMOTEROL FUMARATE 160-4.5 MCG/ACT IN AERO: 1.0000 | INHALATION_SPRAY | Freq: Two times a day (BID) | RESPIRATORY_TRACT | Status: DC

## 2013-05-18 MED ORDER — LOSARTAN POTASSIUM 50 MG PO TABS: 50.0000 mg | ORAL_TABLET | Freq: Every day | ORAL | Status: DC

## 2013-05-18 NOTE — Progress Notes (Signed)
Subjective:    Patient ID: [name], female    DOB: [birth year], 79 y.o.   MRN: [id number]  HPI Patient presents today for chronic health follow up. History of HLD, osteoporosis, HTN, GERD,asthma, anxiety, and insomnia. She reports feeling more tired over past year and feels like some memory loss. Her daughter verified these changes. Patient denies regular physical activity.  Reports taking norco, once weekly at the most. Xanax taken 1-2 times weekly.    Review of Systems  Constitutional: Positive for fatigue. Negative for chills.  Respiratory: Negative for chest tightness and shortness of breath.   Cardiovascular: Negative for chest pain and palpitations.  Gastrointestinal: Positive for abdominal pain. Negative for diarrhea, constipation, blood in stool and abdominal distention.  Genitourinary: Positive for dysuria. Negative for urgency, frequency, hematuria and difficulty urinating.  Neurological: Negative for dizziness, weakness and headaches.       Objective:   Physical Exam  Constitutional: She is oriented to person, place, and time. She appears well-developed and well-nourished.  HENT:  Head: Normocephalic and atraumatic.  Right Ear: External ear normal.  Left Ear: External ear normal.  Nose: Nose normal.  Mouth/Throat: Oropharynx is clear and moist.  Eyes: EOM are normal. Pupils are equal, round, and reactive to light.  Neck: Normal range of motion. Neck supple. No thyromegaly present.  Cardiovascular: Normal rate, regular rhythm and normal heart sounds.   Pulmonary/Chest: Effort normal and breath sounds normal. No respiratory distress. She exhibits no tenderness.  Abdominal: Soft. Bowel sounds are normal. She exhibits no distension. There is no tenderness.  Musculoskeletal: She exhibits no edema and no tenderness.  Lymphadenopathy:    She has no cervical adenopathy.  Neurological: She is alert and oriented to person, place, and time.  Skin: Skin is warm and dry.    Psychiatric: She has a normal mood and affect.     Results for orders placed in visit on 05/18/13  POCT UA - MICROSCOPIC ONLY      Result Value Range   WBC, Ur, HPF, POC neg     RBC, urine, microscopic neg     Bacteria, U Microscopic neg     Mucus, UA neg     Epithelial cells, urine per micros occ     Crystals, Ur, HPF, POC neg     Casts, Ur, LPF, POC neg     Yeast, UA neg    POCT URINALYSIS DIPSTICK      Result Value Range   Color, UA yellow     Clarity, UA clear     Glucose, UA neg     Bilirubin, UA neg     Ketones, UA neg     Spec Grav, UA 1.010     Blood, UA neg     pH, UA 7.5     Protein, UA neg     Urobilinogen, UA negative     Nitrite, UA neg     Leukocytes, UA Negative     [facility] reading (PRIMARY) by Erby Pian, FNP-C, large amount of stool noted in colon.       Assessment & Plan:  1. Suprapubic pressure  - POCT UA - Microscopic Only - POCT urinalysis dipstick - DG Abd 1 View; Future  2. Hypertension  - CMP14+EGFR  3. Malaise and fatigue  - Thyroid Panel With TSH - CBC with Differential - Vitamin B12 - Vit D  25 hydroxy (rtn osteoporosis monitoring)  4. HLD (hyperlipidemia)  - Lipid panel  5. Other  and unspecified hyperlipidemia - simvastatin (ZOCOR) 10 MG tablet; Take 1 tablet (10 mg total) by mouth at bedtime.  Dispense: 90 tablet; Refill: 1  6. Essential hypertension, benign  - losartan (COZAAR) 50 MG tablet; Take 1 tablet (50 mg total) by mouth daily.  Dispense: 90 tablet; Refill: 1  7. GERD (gastroesophageal reflux disease)  - esomeprazole (NEXIUM) 40 MG capsule; Take 1 capsule (40 mg total) by mouth daily before breakfast.  Dispense: 90 capsule; Refill: 3  8. Stopped smoking  - buPROPion (WELLBUTRIN SR) 150 MG 12 hr tablet; Take 1 tablet (150 mg total) by mouth 2 (two) times daily.  Dispense: 180 tablet; Refill: 1  9. Insomnia  - amitriptyline (ELAVIL) 25 MG tablet; Take 1 tablet (25 mg total) by mouth at bedtime.   Dispense: 90 tablet; Refill: 1  10. Osteoporosis  - raloxifene (EVISTA) 60 MG tablet; Take 1 tablet (60 mg total) by mouth daily.  Dispense: 90 tablet; Refill: 1  11. Asthma, chronic  - budesonide-formoterol (SYMBICORT) 160-4.5 MCG/ACT inhaler; Inhale 1 puff into the lungs 2 (two) times daily.  Dispense: 1 Inhaler; Refill: 3  12. Generalized anxiety disorder -xanax 0.1m daily, prn anxiety -take medications as prescribed Labs pending F/u in 3 months and prn Discussed benefits of regular exercise and healthy eating -discussed constipation and prevention -Miralax 17grams daily, for 1-4 days, until bowel movement  Increase fluid intake (water) Increase fiber in diet (fruits, vegetables, whole grains) Take stool softner daily -Patient verbalized understanding MErby Pian FNP-C

## 2013-05-18 NOTE — Patient Instructions (Signed)
Exercise to Stay Healthy Exercise helps you become and stay healthy. EXERCISE IDEAS AND TIPS Choose exercises that:  You enjoy.  Fit into your day. You do not need to exercise really hard to be healthy. You can do exercises at a slow or medium level and stay healthy. You can:  Stretch before and after working out.  Try yoga, Pilates, or tai chi.  Lift weights.  Walk fast, swim, jog, run, climb stairs, bicycle, dance, or rollerskate.  Take aerobic classes. Exercises that burn about 150 calories:  Running 1  miles in 15 minutes.  Playing volleyball for 45 to 60 minutes.  Washing and waxing a car for 45 to 60 minutes.  Playing touch football for 45 minutes.  Walking 1  miles in 35 minutes.  Pushing a stroller 1  miles in 30 minutes.  Playing basketball for 30 minutes.  Raking leaves for 30 minutes.  Bicycling 5 miles in 30 minutes.  Walking 2 miles in 30 minutes.  Dancing for 30 minutes.  Shoveling snow for 15 minutes.  Swimming laps for 20 minutes.  Walking up stairs for 15 minutes.  Bicycling 4 miles in 15 minutes.  Gardening for 30 to 45 minutes.  Jumping rope for 15 minutes.  Washing windows or floors for 45 to 60 minutes. Document Released: 05/19/2010 Document Revised: 07/09/2011 Document Reviewed: 05/19/2010 [employer] Patient Information 2014 [employer].  

## 2013-05-19 LAB — CBC WITH DIFFERENTIAL/PLATELET
Basophils Absolute: 0 10*3/uL (ref 0.0–0.2)
Basos: 0 %
EOS: 2 %
Eosinophils Absolute: 0.1 10*3/uL (ref 0.0–0.4)
HEMATOCRIT: 42.6 % (ref 34.0–46.6)
HEMOGLOBIN: 14 g/dL (ref 11.1–15.9)
IMMATURE GRANS (ABS): 0 10*3/uL (ref 0.0–0.1)
Immature Granulocytes: 0 %
LYMPHS ABS: 2.3 10*3/uL (ref 0.7–3.1)
Lymphs: 25 %
MCH: 29.6 pg (ref 26.6–33.0)
MCHC: 32.9 g/dL (ref 31.5–35.7)
MCV: 90 fL (ref 79–97)
MONOS ABS: 0.3 10*3/uL (ref 0.1–0.9)
Monocytes: 4 %
NEUTROS ABS: 6.4 10*3/uL (ref 1.4–7.0)
Neutrophils Relative %: 69 %
RBC: 4.73 x10E6/uL (ref 3.77–5.28)
RDW: 14.7 % (ref 12.3–15.4)
WBC: 9.2 10*3/uL (ref 3.4–10.8)

## 2013-05-19 LAB — VITAMIN D 25 HYDROXY (VIT D DEFICIENCY, FRACTURES): Vit D, 25-Hydroxy: 23.9 ng/mL — ABNORMAL LOW (ref 30.0–100.0)

## 2013-05-19 LAB — LIPID PANEL
CHOLESTEROL TOTAL: 211 mg/dL — AB (ref 100–199)
Chol/HDL Ratio: 2.7 ratio units (ref 0.0–4.4)
HDL: 77 mg/dL (ref >39)
LDL Calculated: 112 mg/dL — ABNORMAL HIGH (ref 0–99)
Triglycerides: 110 mg/dL (ref 0–149)
VLDL CHOLESTEROL CAL: 22 mg/dL (ref 5–40)

## 2013-05-19 LAB — CMP14+EGFR
ALBUMIN: 4.4 g/dL (ref 3.5–4.8)
ALT: 12 IU/L (ref 0–32)
AST: 22 IU/L (ref 0–40)
Albumin/Globulin Ratio: 1.8 (ref 1.1–2.5)
Alkaline Phosphatase: 128 IU/L — ABNORMAL HIGH (ref 39–117)
BUN/Creatinine Ratio: 9 — ABNORMAL LOW (ref 11–26)
BUN: 9 mg/dL (ref 8–27)
CALCIUM: 10.1 mg/dL (ref 8.7–10.3)
CHLORIDE: 105 mmol/L (ref 97–108)
CO2: 21 mmol/L (ref 18–29)
Creatinine, Ser: 1.04 mg/dL — ABNORMAL HIGH (ref 0.57–1.00)
GFR calc Af Amer: 60 mL/min/{1.73_m2} (ref >59)
GFR, EST NON AFRICAN AMERICAN: 52 mL/min/{1.73_m2} — AB (ref >59)
GLUCOSE: 87 mg/dL (ref 65–99)
Globulin, Total: 2.5 g/dL (ref 1.5–4.5)
POTASSIUM: 4.4 mmol/L (ref 3.5–5.2)
Sodium: 144 mmol/L (ref 134–144)
TOTAL PROTEIN: 6.9 g/dL (ref 6.0–8.5)
Total Bilirubin: 0.2 mg/dL (ref 0.0–1.2)

## 2013-05-19 LAB — THYROID PANEL WITH TSH
Free Thyroxine Index: 2.1 (ref 1.2–4.9)
T3 Uptake Ratio: 24 % (ref 24–39)
T4, Total: 8.9 ug/dL (ref 4.5–12.0)
TSH: 0.983 u[IU]/mL (ref 0.450–4.500)

## 2013-05-19 LAB — VITAMIN B12: Vitamin B-12: 1478 pg/mL — ABNORMAL HIGH (ref 211–946)

## 2013-05-29 ENCOUNTER — Telehealth: Payer: Self-pay | Admitting: Family Medicine

## 2013-05-29 ENCOUNTER — Other Ambulatory Visit: Payer: Self-pay | Admitting: General Practice

## 2013-05-29 ENCOUNTER — Other Ambulatory Visit: Payer: Self-pay | Admitting: *Deleted

## 2013-05-29 DIAGNOSIS — D649 Anemia, unspecified: Secondary | ICD-10-CM

## 2013-05-29 DIAGNOSIS — E559 Vitamin D deficiency, unspecified: Secondary | ICD-10-CM

## 2013-05-29 MED ORDER — VITAMIN D3 1.25 MG (50000 UT) PO CAPS: 1.0000 | ORAL_CAPSULE | ORAL | Status: DC

## 2013-05-29 NOTE — Telephone Encounter (Signed)
Received request from pharmacy for refill but do not see on med list. Please advise

## 2013-05-29 NOTE — Telephone Encounter (Signed)
PT AWARE OF LABS.  APPT WAS MADE FOR 06/22/13 W/ [facility] FOR 4WK RCK.  PT IS TO COME GET FOBT KIT.  PT IS GOING TO GET OTC IRON SUPPLEMENT AND WILL INCREASE VIT D TO 2000 UNITS.   RS

## 2013-05-29 NOTE — Telephone Encounter (Signed)
Patient would like for you to call her in some vit d to [facility] since it was low last time

## 2013-05-29 NOTE — Telephone Encounter (Signed)
Message copied by Waverly Ferrari on Fri May 29, 2013 11:35 AM ------      Message from: Erby Pian      Created: Fri May 29, 2013  7:55 AM       Please inform that most labs are wnl. LDL elevated, continue to take lipid lowering medication, eat low fat, baked foods. Also vit D level below normal range, increase to 2000 units daily supplement. Hemoglobin in low at 10.4, please take OTC iron supplement, will recheck in 4 weeks. Needs to perform FOBT at home, come by office and pick up kit. ------

## 2013-05-29 NOTE — Telephone Encounter (Signed)
Please inform script sent to pharmacy for Vitamin D, once weekly. Will recheck with routine labs.

## 2013-06-01 ENCOUNTER — Telehealth: Payer: Self-pay | Admitting: General Practice

## 2013-06-01 ENCOUNTER — Other Ambulatory Visit: Payer: Self-pay

## 2013-06-01 NOTE — Telephone Encounter (Signed)
Last seen 05/18/13  mAE  This med not On EPIC list

## 2013-06-02 NOTE — Telephone Encounter (Signed)
Patient aware already has an appointment  

## 2013-06-02 NOTE — Telephone Encounter (Signed)
NTBS to test B12

## 2013-06-02 NOTE — Telephone Encounter (Signed)
Taken care.

## 2013-06-03 ENCOUNTER — Telehealth: Payer: Self-pay | Admitting: *Deleted

## 2013-06-03 NOTE — Telephone Encounter (Signed)
Received fax from [facility] for refill on furosemide 20mg  Take 1/2 tab by mouth every morning. Do not see on med list. Please advise

## 2013-06-05 MED ORDER — FUROSEMIDE 20 MG PO TABS: ORAL_TABLET | ORAL | Status: DC

## 2013-06-09 ENCOUNTER — Other Ambulatory Visit: Payer: Self-pay | Admitting: Nurse Practitioner

## 2013-06-10 NOTE — Telephone Encounter (Signed)
Received request from pharmacy for a refill on IBU but do not see on med list. Please advise

## 2013-06-11 ENCOUNTER — Encounter: Payer: Self-pay | Admitting: *Deleted

## 2013-06-18 ENCOUNTER — Ambulatory Visit: Payer: Medicare Other | Admitting: Internal Medicine

## 2013-06-22 ENCOUNTER — Ambulatory Visit: Payer: Medicare Other | Admitting: General Practice

## 2013-06-24 ENCOUNTER — Other Ambulatory Visit (INDEPENDENT_AMBULATORY_CARE_PROVIDER_SITE_OTHER): Payer: Medicare Other

## 2013-06-24 ENCOUNTER — Ambulatory Visit (INDEPENDENT_AMBULATORY_CARE_PROVIDER_SITE_OTHER): Payer: Medicare Other | Admitting: General Practice

## 2013-06-24 DIAGNOSIS — D649 Anemia, unspecified: Secondary | ICD-10-CM

## 2013-06-24 LAB — POCT CBC
Granulocyte percent: 57.3 %G (ref 37–80)
HCT, POC: 41.3 % (ref 37.7–47.9)
HEMOGLOBIN: 13.6 g/dL (ref 12.2–16.2)
LYMPH, POC: 2.8 (ref 0.6–3.4)
MCH, POC: 29.6 pg (ref 27–31.2)
MCHC: 32.9 g/dL (ref 31.8–35.4)
MCV: 89.9 fL (ref 80–97)
MPV: 7.6 fL (ref 0–99.8)
POC Granulocyte: 4.2 (ref 2–6.9)
POC LYMPH PERCENT: 38.6 %L (ref 10–50)
Platelet Count, POC: 316 10*3/uL (ref 142–424)
RBC: 4.6 M/uL (ref 4.04–5.48)
RDW, POC: 14.7 %
WBC: 7.3 10*3/uL (ref 4.6–10.2)

## 2013-06-24 NOTE — Progress Notes (Signed)
Pt came in for labs only 

## 2013-06-26 ENCOUNTER — Ambulatory Visit: Payer: Medicare Other | Admitting: General Practice

## 2013-07-01 ENCOUNTER — Encounter: Payer: Self-pay | Admitting: *Deleted

## 2013-07-13 ENCOUNTER — Ambulatory Visit: Payer: Medicare Other | Admitting: Internal Medicine

## 2013-07-15 ENCOUNTER — Ambulatory Visit: Payer: Medicare Other | Admitting: Internal Medicine

## 2013-07-16 ENCOUNTER — Other Ambulatory Visit: Payer: Self-pay | Admitting: Nurse Practitioner

## 2013-07-23 ENCOUNTER — Other Ambulatory Visit: Payer: Self-pay | Admitting: General Practice

## 2013-07-27 ENCOUNTER — Ambulatory Visit (INDEPENDENT_AMBULATORY_CARE_PROVIDER_SITE_OTHER): Payer: Medicare Other | Admitting: General Practice

## 2013-07-27 ENCOUNTER — Encounter: Payer: Self-pay | Admitting: General Practice

## 2013-07-27 VITALS — BP 155/83 | HR 85 | Temp 97.8°F | Ht 62.0 in | Wt 151.6 lb

## 2013-07-27 DIAGNOSIS — K219 Gastro-esophageal reflux disease without esophagitis: Secondary | ICD-10-CM

## 2013-07-27 DIAGNOSIS — L259 Unspecified contact dermatitis, unspecified cause: Secondary | ICD-10-CM

## 2013-07-27 DIAGNOSIS — L309 Dermatitis, unspecified: Secondary | ICD-10-CM

## 2013-07-27 DIAGNOSIS — L659 Nonscarring hair loss, unspecified: Secondary | ICD-10-CM

## 2013-07-27 MED ORDER — TRIAMCINOLONE ACETONIDE 0.025 % EX CREA: 1.0000 "application " | TOPICAL_CREAM | Freq: Two times a day (BID) | CUTANEOUS | Status: AC

## 2013-07-27 MED ORDER — ESOMEPRAZOLE MAGNESIUM 40 MG PO CPDR: 40.0000 mg | DELAYED_RELEASE_CAPSULE | Freq: Every day | ORAL | Status: DC

## 2013-07-27 NOTE — Progress Notes (Signed)
   Subjective:    Patient ID: [name], female    DOB: 1934/07/27, 79 y.o.   MRN: [id number]  HPI Patient presents today with complaints of hair loss. Reports onset of hair loss was 3- 4 days after starting a new medication. She has followed up with provider who prescribed medication. Started new medication on 06/29/13. Denies using any new or different products in hair or styles that cause stress to scalp. Reports itching scalp. Taken benadryl without relief.     Review of Systems  Constitutional: Negative for fever and chills.  Respiratory: Negative for chest tightness and shortness of breath.   Cardiovascular: Negative for chest pain and palpitations.  Skin:       Hair loss        Objective:   Physical Exam  Constitutional: She is oriented to person, place, and time. She appears well-developed and well-nourished.  Cardiovascular: Normal rate, regular rhythm and normal heart sounds.   Pulmonary/Chest: Effort normal and breath sounds normal. No respiratory distress. She exhibits no tenderness.  Neurological: She is alert and oriented to person, place, and time.  Skin: Skin is warm and dry.  Hair breakage around forehead area  Psychiatric: She has a normal mood and affect.          Assessment & Plan:  1. Eczema  - triamcinolone (KENALOG) 0.025 % cream; Apply 1 application topically 2 (two) times daily.  Dispense: 30 g; Refill: 0  2. GERD (gastroesophageal reflux disease)  - esomeprazole (NEXIUM) 40 MG capsule; Take 1 capsule (40 mg total) by mouth daily before breakfast.  Dispense: 90 capsule; Refill: 3  3. Hair loss  - Ambulatory referral to Dermatology -RTO prn Patient verbalized understanding Coralie Keens, FNP-C

## 2013-08-06 ENCOUNTER — Other Ambulatory Visit: Payer: Self-pay | Admitting: General Practice

## 2013-08-17 ENCOUNTER — Ambulatory Visit: Payer: Medicare Other | Admitting: General Practice

## 2013-08-18 ENCOUNTER — Ambulatory Visit (INDEPENDENT_AMBULATORY_CARE_PROVIDER_SITE_OTHER): Payer: Medicare Other | Admitting: General Practice

## 2013-08-18 ENCOUNTER — Ambulatory Visit (INDEPENDENT_AMBULATORY_CARE_PROVIDER_SITE_OTHER): Payer: Medicare Other

## 2013-08-18 ENCOUNTER — Encounter: Payer: Self-pay | Admitting: General Practice

## 2013-08-18 VITALS — BP 159/82 | HR 79 | Temp 97.8°F | Ht 62.0 in | Wt 150.8 lb

## 2013-08-18 DIAGNOSIS — R109 Unspecified abdominal pain: Secondary | ICD-10-CM

## 2013-08-18 DIAGNOSIS — K219 Gastro-esophageal reflux disease without esophagitis: Secondary | ICD-10-CM

## 2013-08-18 DIAGNOSIS — F411 Generalized anxiety disorder: Secondary | ICD-10-CM

## 2013-08-18 DIAGNOSIS — J45909 Unspecified asthma, uncomplicated: Secondary | ICD-10-CM

## 2013-08-18 DIAGNOSIS — Z87891 Personal history of nicotine dependence: Secondary | ICD-10-CM

## 2013-08-18 DIAGNOSIS — R102 Pelvic and perineal pain: Secondary | ICD-10-CM

## 2013-08-18 DIAGNOSIS — M810 Age-related osteoporosis without current pathological fracture: Secondary | ICD-10-CM

## 2013-08-18 DIAGNOSIS — J309 Allergic rhinitis, unspecified: Secondary | ICD-10-CM

## 2013-08-18 DIAGNOSIS — I10 Essential (primary) hypertension: Secondary | ICD-10-CM

## 2013-08-18 DIAGNOSIS — G4700 Insomnia, unspecified: Secondary | ICD-10-CM

## 2013-08-18 DIAGNOSIS — E785 Hyperlipidemia, unspecified: Secondary | ICD-10-CM

## 2013-08-18 LAB — POCT URINALYSIS DIPSTICK
BILIRUBIN UA: NEGATIVE
Glucose, UA: NEGATIVE
Ketones, UA: NEGATIVE
NITRITE UA: NEGATIVE
Spec Grav, UA: 1.015
UROBILINOGEN UA: NEGATIVE
pH, UA: 6

## 2013-08-18 LAB — POCT UA - MICROSCOPIC ONLY
CRYSTALS, UR, HPF, POC: NEGATIVE
Casts, Ur, LPF, POC: NEGATIVE
Mucus, UA: NEGATIVE
Yeast, UA: NEGATIVE

## 2013-08-18 MED ORDER — LOSARTAN POTASSIUM 50 MG PO TABS: 50.0000 mg | ORAL_TABLET | Freq: Every day | ORAL | Status: DC

## 2013-08-18 MED ORDER — SIMVASTATIN 10 MG PO TABS: 10.0000 mg | ORAL_TABLET | Freq: Every day | ORAL | Status: DC

## 2013-08-18 MED ORDER — BUPROPION HCL ER (SR) 150 MG PO TB12: 150.0000 mg | ORAL_TABLET | Freq: Two times a day (BID) | ORAL | Status: DC

## 2013-08-18 MED ORDER — RALOXIFENE HCL 60 MG PO TABS
60.0000 mg | ORAL_TABLET | Freq: Every day | ORAL | Status: DC
Start: 2013-08-18 — End: 2014-12-10

## 2013-08-18 MED ORDER — BUDESONIDE-FORMOTEROL FUMARATE 160-4.5 MCG/ACT IN AERO: 1.0000 | INHALATION_SPRAY | Freq: Two times a day (BID) | RESPIRATORY_TRACT | Status: DC

## 2013-08-18 MED ORDER — AMITRIPTYLINE HCL 25 MG PO TABS: 25.0000 mg | ORAL_TABLET | Freq: Every day | ORAL | Status: DC

## 2013-08-18 MED ORDER — ALPRAZOLAM 0.5 MG PO TABS: 0.5000 mg | ORAL_TABLET | Freq: Every day | ORAL | Status: DC | PRN

## 2013-08-18 MED ORDER — CETIRIZINE HCL 10 MG PO TABS: 10.0000 mg | ORAL_TABLET | Freq: Every day | ORAL | Status: DC

## 2013-08-18 MED ORDER — FUROSEMIDE 20 MG PO TABS: ORAL_TABLET | ORAL | Status: DC

## 2013-08-18 MED ORDER — ESOMEPRAZOLE MAGNESIUM 40 MG PO CPDR: 40.0000 mg | DELAYED_RELEASE_CAPSULE | Freq: Every day | ORAL | Status: DC

## 2013-08-18 NOTE — Patient Instructions (Signed)

## 2013-08-18 NOTE — Progress Notes (Signed)
Subjective:    Patient ID: [name], female    DOB: 25-Oct-1934, 79 y.o.   MRN: [id number]  HPI Patient presents today for 3 month follow up. History of hypertension, anxiety, insomnia, seasonal allergies, gerd, hyperlipidemia, osteoporosis, and smoking cessation. Eating healthy. Reports taking medications as prescribed and anxiety/insomnia is well controlled. Complains of suprapubic pressure, onset 3 days ago. Denies taking OTC medications or improvement. Denies trauma, burning or discoloration.       Review of Systems  Constitutional: Negative for fever and chills.  HENT: Negative for ear pain.   Respiratory: Negative for chest tightness and shortness of breath.   Cardiovascular: Negative for chest pain and palpitations.  Gastrointestinal: Positive for abdominal pain. Negative for nausea, vomiting, diarrhea and blood in stool.       Bladder area tenderness  Genitourinary: Negative for hematuria and difficulty urinating.  Musculoskeletal: Negative for back pain, neck pain and neck stiffness.  Neurological: Negative for dizziness, weakness and headaches.       Objective:   Physical Exam  Constitutional: She is oriented to person, place, and time. She appears well-developed and well-nourished.  HENT:  Head: Normocephalic and atraumatic.  Right Ear: External ear normal.  Left Ear: External ear normal.  Nose: Nose normal.  Mouth/Throat: Oropharynx is clear and moist.  Eyes: EOM are normal. Pupils are equal, round, and reactive to light.  Neck: Normal range of motion. Neck supple. No thyromegaly present.  Cardiovascular: Normal rate, regular rhythm and normal heart sounds.   Pulmonary/Chest: Effort normal and breath sounds normal. No respiratory distress. She exhibits no tenderness.  Abdominal: Soft. Bowel sounds are normal. She exhibits no distension. There is tenderness in the suprapubic area.  Musculoskeletal: She exhibits no edema and no tenderness.  Lymphadenopathy:   She has no cervical adenopathy.  Neurological: She is alert and oriented to person, place, and time.  Skin: Skin is warm and dry.  Psychiatric: She has a normal mood and affect.            Assessment & Plan:  1. Hyperlipidemia  - Lipid panel  2. Hypertension  - CMP14+EGFR  3. Suprapubic pressure  - POCT urinalysis dipstick - POCT UA - Microscopic Only - DG Abd 1 View; Future - CT Abdomen Pelvis Wo Contrast; Future  4. Insomnia  - amitriptyline (ELAVIL) 25 MG tablet; Take 1 tablet (25 mg total) by mouth at bedtime.  Dispense: 90 tablet; Refill: 1  5. Asthma, chronic  - budesonide-formoterol (SYMBICORT) 160-4.5 MCG/ACT inhaler; Inhale 1 puff into the lungs 2 (two) times daily.  Dispense: 1 Inhaler; Refill: 3  6. Stopped smoking  - buPROPion (WELLBUTRIN SR) 150 MG 12 hr tablet; Take 1 tablet (150 mg total) by mouth 2 (two) times daily.  Dispense: 180 tablet; Refill: 1  7. Allergic rhinitis  - cetirizine (ZYRTEC) 10 MG tablet; Take 1 tablet (10 mg total) by mouth daily.  Dispense: 30 tablet; Refill: 11  8. GERD (gastroesophageal reflux disease)  - esomeprazole (NEXIUM) 40 MG capsule; Take 1 capsule (40 mg total) by mouth daily before breakfast.  Dispense: 90 capsule; Refill: 3  9. Essential hypertension, benign - furosemide (LASIX) 20 MG tablet; Take 1/2 tablet (29m) once daily  Dispense: 30 tablet; Refill: 3 - losartan (COZAAR) 50 MG tablet; Take 1 tablet (50 mg total) by mouth daily.  Dispense: 90 tablet; Refill: 1  10. Osteoporosis  - raloxifene (EVISTA) 60 MG tablet; Take 1 tablet (60 mg total) by mouth daily.  Dispense:  90 tablet; Refill: 1  11. Other and unspecified hyperlipidemia  - simvastatin (ZOCOR) 10 MG tablet; Take 1 tablet (10 mg total) by mouth at bedtime.  Dispense: 90 tablet; Refill: 1  12. Generalized anxiety disorder  - ALPRAZolam (XANAX) 0.5 MG tablet; Take 1 tablet (0.5 mg total) by mouth daily as needed for anxiety.  Dispense: 30  tablet; Refill: 1 -Continue all current medications Labs pending F/u in 3 months Discussed benefits of regular exercise and healthy eating Patient verbalized understanding Erby Pian, FNP-C

## 2013-08-19 LAB — CMP14+EGFR
ALK PHOS: 102 IU/L (ref 39–117)
ALT: 11 IU/L (ref 0–32)
AST: 21 IU/L (ref 0–40)
Albumin/Globulin Ratio: 1.9 (ref 1.1–2.5)
Albumin: 4.1 g/dL (ref 3.5–4.8)
BILIRUBIN TOTAL: 0.3 mg/dL (ref 0.0–1.2)
BUN/Creatinine Ratio: 13 (ref 11–26)
BUN: 13 mg/dL (ref 8–27)
CALCIUM: 8.8 mg/dL (ref 8.7–10.3)
CO2: 23 mmol/L (ref 18–29)
Chloride: 106 mmol/L (ref 97–108)
Creatinine, Ser: 1.03 mg/dL — ABNORMAL HIGH (ref 0.57–1.00)
GFR calc Af Amer: 61 mL/min/{1.73_m2} (ref >59)
GFR calc non Af Amer: 53 mL/min/{1.73_m2} — ABNORMAL LOW (ref >59)
Globulin, Total: 2.2 g/dL (ref 1.5–4.5)
Glucose: 94 mg/dL (ref 65–99)
Potassium: 4 mmol/L (ref 3.5–5.2)
SODIUM: 143 mmol/L (ref 134–144)
Total Protein: 6.3 g/dL (ref 6.0–8.5)

## 2013-08-19 LAB — LIPID PANEL
CHOL/HDL RATIO: 2.6 ratio (ref 0.0–4.4)
Cholesterol, Total: 177 mg/dL (ref 100–199)
HDL: 68 mg/dL (ref >39)
LDL CALC: 95 mg/dL (ref 0–99)
TRIGLYCERIDES: 69 mg/dL (ref 0–149)
VLDL Cholesterol Cal: 14 mg/dL (ref 5–40)

## 2013-08-20 ENCOUNTER — Other Ambulatory Visit (HOSPITAL_COMMUNITY): Payer: Medicare Other

## 2013-08-21 ENCOUNTER — Ambulatory Visit (HOSPITAL_COMMUNITY): Payer: Medicare Other

## 2013-09-15 ENCOUNTER — Other Ambulatory Visit: Payer: Self-pay | Admitting: General Practice

## 2013-10-04 ENCOUNTER — Encounter (HOSPITAL_COMMUNITY): Payer: Self-pay | Admitting: Emergency Medicine

## 2013-10-04 ENCOUNTER — Observation Stay (HOSPITAL_COMMUNITY)
Admission: EM | Admit: 2013-10-04 | Discharge: 2013-10-06 | Disposition: A | Discharge status: Home / Self Care | Payer: Medicare Other | Attending: Internal Medicine | Admitting: Internal Medicine

## 2013-10-04 ENCOUNTER — Emergency Department (HOSPITAL_COMMUNITY): Payer: Medicare Other

## 2013-10-04 DIAGNOSIS — I10 Essential (primary) hypertension: Secondary | ICD-10-CM | POA: Diagnosis present

## 2013-10-04 DIAGNOSIS — M129 Arthropathy, unspecified: Secondary | ICD-10-CM | POA: Insufficient documentation

## 2013-10-04 DIAGNOSIS — R112 Nausea with vomiting, unspecified: Secondary | ICD-10-CM | POA: Insufficient documentation

## 2013-10-04 DIAGNOSIS — Y92009 Unspecified place in unspecified non-institutional (private) residence as the place of occurrence of the external cause: Secondary | ICD-10-CM | POA: Insufficient documentation

## 2013-10-04 DIAGNOSIS — M19011 Primary osteoarthritis, right shoulder: Secondary | ICD-10-CM | POA: Diagnosis present

## 2013-10-04 DIAGNOSIS — I429 Cardiomyopathy, unspecified: Secondary | ICD-10-CM

## 2013-10-04 DIAGNOSIS — F3289 Other specified depressive episodes: Secondary | ICD-10-CM | POA: Insufficient documentation

## 2013-10-04 DIAGNOSIS — E8779 Other fluid overload: Secondary | ICD-10-CM | POA: Insufficient documentation

## 2013-10-04 DIAGNOSIS — R296 Repeated falls: Secondary | ICD-10-CM | POA: Insufficient documentation

## 2013-10-04 DIAGNOSIS — R55 Syncope and collapse: Secondary | ICD-10-CM

## 2013-10-04 DIAGNOSIS — I447 Left bundle-branch block, unspecified: Secondary | ICD-10-CM

## 2013-10-04 DIAGNOSIS — Z79899 Other long term (current) drug therapy: Secondary | ICD-10-CM | POA: Insufficient documentation

## 2013-10-04 DIAGNOSIS — R5381 Other malaise: Secondary | ICD-10-CM | POA: Insufficient documentation

## 2013-10-04 DIAGNOSIS — F329 Major depressive disorder, single episode, unspecified: Secondary | ICD-10-CM | POA: Insufficient documentation

## 2013-10-04 DIAGNOSIS — E7800 Pure hypercholesterolemia, unspecified: Secondary | ICD-10-CM | POA: Insufficient documentation

## 2013-10-04 DIAGNOSIS — Z7982 Long term (current) use of aspirin: Secondary | ICD-10-CM | POA: Insufficient documentation

## 2013-10-04 DIAGNOSIS — Z043 Encounter for examination and observation following other accident: Secondary | ICD-10-CM | POA: Insufficient documentation

## 2013-10-04 DIAGNOSIS — R5383 Other fatigue: Secondary | ICD-10-CM

## 2013-10-04 DIAGNOSIS — Z880 Allergy status to penicillin: Secondary | ICD-10-CM | POA: Insufficient documentation

## 2013-10-04 DIAGNOSIS — Z9889 Other specified postprocedural states: Secondary | ICD-10-CM | POA: Insufficient documentation

## 2013-10-04 DIAGNOSIS — E785 Hyperlipidemia, unspecified: Secondary | ICD-10-CM | POA: Diagnosis present

## 2013-10-04 DIAGNOSIS — R42 Dizziness and giddiness: Secondary | ICD-10-CM | POA: Insufficient documentation

## 2013-10-04 DIAGNOSIS — K219 Gastro-esophageal reflux disease without esophagitis: Secondary | ICD-10-CM | POA: Insufficient documentation

## 2013-10-04 DIAGNOSIS — Y9301 Activity, walking, marching and hiking: Secondary | ICD-10-CM | POA: Insufficient documentation

## 2013-10-04 DIAGNOSIS — F17200 Nicotine dependence, unspecified, uncomplicated: Secondary | ICD-10-CM | POA: Insufficient documentation

## 2013-10-04 HISTORY — DX: Essential (primary) hypertension: I10

## 2013-10-04 HISTORY — DX: Mixed hyperlipidemia: E78.2

## 2013-10-04 HISTORY — DX: Cardiomyopathy, unspecified: I42.9

## 2013-10-04 LAB — BASIC METABOLIC PANEL
BUN: 20 mg/dL (ref 6–23)
CO2: 21 mEq/L (ref 19–32)
Calcium: 9 mg/dL (ref 8.4–10.5)
Chloride: 104 mEq/L (ref 96–112)
Creatinine, Ser: 0.99 mg/dL (ref 0.50–1.10)
GFR calc non Af Amer: 54 mL/min — ABNORMAL LOW (ref >90)
GFR, EST AFRICAN AMERICAN: 63 mL/min — AB (ref >90)
Glucose, Bld: 108 mg/dL — ABNORMAL HIGH (ref 70–99)
POTASSIUM: 3.9 meq/L (ref 3.7–5.3)
SODIUM: 141 meq/L (ref 137–147)

## 2013-10-04 LAB — CBC WITH DIFFERENTIAL/PLATELET
Basophils Absolute: 0 K/uL (ref 0.0–0.1)
Basophils Relative: 0 % (ref 0–1)
Eosinophils Absolute: 0 K/uL (ref 0.0–0.7)
Eosinophils Relative: 0 % (ref 0–5)
HCT: 38.7 % (ref 36.0–46.0)
Hemoglobin: 13.2 g/dL (ref 12.0–15.0)
Lymphocytes Relative: 13 % (ref 12–46)
Lymphs Abs: 1.4 K/uL (ref 0.7–4.0)
MCH: 29.9 pg (ref 26.0–34.0)
MCHC: 34.1 g/dL (ref 30.0–36.0)
MCV: 87.6 fL (ref 78.0–100.0)
Monocytes Absolute: 0.4 K/uL (ref 0.1–1.0)
Monocytes Relative: 4 % (ref 3–12)
Neutro Abs: 8.6 K/uL — ABNORMAL HIGH (ref 1.7–7.7)
Neutrophils Relative %: 83 % — ABNORMAL HIGH (ref 43–77)
Platelets: 319 K/uL (ref 150–400)
RBC: 4.42 MIL/uL (ref 3.87–5.11)
RDW: 14.1 % (ref 11.5–15.5)
WBC: 10.4 K/uL (ref 4.0–10.5)

## 2013-10-04 LAB — URINALYSIS, ROUTINE W REFLEX MICROSCOPIC
Bilirubin Urine: NEGATIVE
Glucose, UA: NEGATIVE mg/dL
Hgb urine dipstick: NEGATIVE
Ketones, ur: NEGATIVE mg/dL
Leukocytes, UA: NEGATIVE
Nitrite: NEGATIVE
Protein, ur: NEGATIVE mg/dL
Specific Gravity, Urine: 1.015 (ref 1.005–1.030)
Urobilinogen, UA: 0.2 mg/dL (ref 0.0–1.0)
pH: 6.5 (ref 5.0–8.0)

## 2013-10-04 LAB — TROPONIN I
Troponin I: <0.3 ng/mL
Troponin I: <0.3 ng/mL (ref <0.30)

## 2013-10-04 MED ORDER — ADULT MULTIVITAMIN W/MINERALS CH
1.0000 | ORAL_TABLET | Freq: Every day | ORAL | Status: DC
  Administered 2013-10-04 – 2013-10-06 (×3): 1 via ORAL
  Filled 2013-10-04 (×3): qty 1

## 2013-10-04 MED ORDER — RALOXIFENE HCL 60 MG PO TABS
60.0000 mg | ORAL_TABLET | Freq: Every day | ORAL | Status: DC
  Administered 2013-10-05 – 2013-10-06 (×2): 60 mg via ORAL
  Filled 2013-10-04 (×4): qty 1

## 2013-10-04 MED ORDER — ASPIRIN EC 81 MG PO TBEC
81.0000 mg | DELAYED_RELEASE_TABLET | Freq: Every day | ORAL | Status: DC
  Administered 2013-10-05 – 2013-10-06 (×2): 81 mg via ORAL
  Filled 2013-10-04 (×4): qty 1

## 2013-10-04 MED ORDER — SODIUM CHLORIDE 0.9 % IJ SOLN
3.0000 mL | Freq: Two times a day (BID) | INTRAMUSCULAR | Status: DC
  Administered 2013-10-05 – 2013-10-06 (×3): 3 mL via INTRAVENOUS

## 2013-10-04 MED ORDER — ONDANSETRON HCL 4 MG/2ML IJ SOLN: 4.0000 mg | INTRAMUSCULAR | Status: DC | PRN

## 2013-10-04 MED ORDER — FOLIC ACID 1 MG PO TABS
1.0000 mg | ORAL_TABLET | Freq: Every day | ORAL | Status: DC
  Administered 2013-10-04 – 2013-10-06 (×3): 1 mg via ORAL
  Filled 2013-10-04 (×3): qty 1

## 2013-10-04 MED ORDER — ACETAMINOPHEN 325 MG PO TABS
650.0000 mg | ORAL_TABLET | Freq: Four times a day (QID) | ORAL | Status: DC | PRN
  Administered 2013-10-05: 650 mg via ORAL
  Filled 2013-10-04: qty 2

## 2013-10-04 MED ORDER — SODIUM CHLORIDE 0.9 % IV SOLN
INTRAVENOUS | Status: DC
  Administered 2013-10-04 – 2013-10-05 (×2): via INTRAVENOUS

## 2013-10-04 MED ORDER — ACETAMINOPHEN 650 MG RE SUPP: 650.0000 mg | Freq: Four times a day (QID) | RECTAL | Status: DC | PRN

## 2013-10-04 MED ORDER — BUPROPION HCL ER (XL) 150 MG PO TB24
ORAL_TABLET | ORAL | Status: AC
Start: 2013-10-04 — End: 2013-10-04
  Filled 2013-10-04: qty 1

## 2013-10-04 MED ORDER — ALUM & MAG HYDROXIDE-SIMETH 200-200-20 MG/5ML PO SUSP: 30.0000 mL | Freq: Four times a day (QID) | ORAL | Status: DC | PRN

## 2013-10-04 MED ORDER — BUPROPION HCL ER (SR) 150 MG PO TB12
150.0000 mg | ORAL_TABLET | Freq: Two times a day (BID) | ORAL | Status: DC
  Administered 2013-10-04 – 2013-10-06 (×4): 150 mg via ORAL
  Filled 2013-10-04 (×6): qty 1

## 2013-10-04 MED ORDER — LORATADINE 10 MG PO TABS
10.0000 mg | ORAL_TABLET | Freq: Every day | ORAL | Status: DC
  Administered 2013-10-04 – 2013-10-06 (×3): 10 mg via ORAL
  Filled 2013-10-04 (×3): qty 1

## 2013-10-04 MED ORDER — ONDANSETRON HCL 4 MG PO TABS: 4.0000 mg | ORAL_TABLET | Freq: Four times a day (QID) | ORAL | Status: DC | PRN

## 2013-10-04 MED ORDER — BUPROPION HCL ER (SR) 150 MG PO TB12
ORAL_TABLET | ORAL | Status: AC
Start: 2013-10-04 — End: 2013-10-04
  Filled 2013-10-04: qty 1

## 2013-10-04 MED ORDER — BUDESONIDE-FORMOTEROL FUMARATE 160-4.5 MCG/ACT IN AERO
1.0000 | INHALATION_SPRAY | Freq: Two times a day (BID) | RESPIRATORY_TRACT | Status: DC
  Administered 2013-10-04 – 2013-10-06 (×4): 1 via RESPIRATORY_TRACT
  Filled 2013-10-04: qty 6

## 2013-10-04 MED ORDER — DOCUSATE SODIUM 100 MG PO CAPS
100.0000 mg | ORAL_CAPSULE | Freq: Two times a day (BID) | ORAL | Status: DC
  Administered 2013-10-04 – 2013-10-06 (×4): 100 mg via ORAL
  Filled 2013-10-04 (×8): qty 1

## 2013-10-04 MED ORDER — AMITRIPTYLINE HCL 25 MG PO TABS
25.0000 mg | ORAL_TABLET | Freq: Every day | ORAL | Status: DC
  Administered 2013-10-04 – 2013-10-05 (×2): 25 mg via ORAL
  Filled 2013-10-04 (×4): qty 1

## 2013-10-04 MED ORDER — ONDANSETRON HCL 4 MG/2ML IJ SOLN: 4.0000 mg | Freq: Four times a day (QID) | INTRAMUSCULAR | Status: DC | PRN

## 2013-10-04 MED ORDER — HEPARIN SODIUM (PORCINE) 5000 UNIT/ML IJ SOLN
5000.0000 [IU] | Freq: Three times a day (TID) | INTRAMUSCULAR | Status: DC
  Administered 2013-10-04 – 2013-10-06 (×6): 5000 [IU] via SUBCUTANEOUS
  Filled 2013-10-04 (×6): qty 1

## 2013-10-04 MED ORDER — VITAMIN D (ERGOCALCIFEROL) 1.25 MG (50000 UNIT) PO CAPS
50000.0000 [IU] | ORAL_CAPSULE | ORAL | Status: DC
  Administered 2013-10-05: 50000 [IU] via ORAL
  Filled 2013-10-04: qty 1

## 2013-10-04 MED ORDER — PNEUMOCOCCAL VAC POLYVALENT 25 MCG/0.5ML IJ INJ
0.5000 mL | INJECTION | INTRAMUSCULAR | Status: DC
  Filled 2013-10-04: qty 0.5

## 2013-10-04 MED ORDER — VITAMIN B-12 1000 MCG PO TABS
1000.0000 ug | ORAL_TABLET | Freq: Every day | ORAL | Status: DC
  Administered 2013-10-05 – 2013-10-06 (×2): 1000 ug via ORAL
  Filled 2013-10-04 (×4): qty 1

## 2013-10-04 MED ORDER — PANTOPRAZOLE SODIUM 40 MG PO TBEC
80.0000 mg | DELAYED_RELEASE_TABLET | Freq: Every day | ORAL | Status: DC
  Administered 2013-10-05 – 2013-10-06 (×2): 80 mg via ORAL
  Filled 2013-10-04 (×2): qty 2

## 2013-10-04 MED ORDER — MIRABEGRON ER 25 MG PO TB24
50.0000 mg | ORAL_TABLET | Freq: Every day | ORAL | Status: DC
  Administered 2013-10-05 – 2013-10-06 (×2): 50 mg via ORAL
  Filled 2013-10-04: qty 1
  Filled 2013-10-04: qty 2
  Filled 2013-10-04: qty 1

## 2013-10-04 MED ORDER — ALPRAZOLAM 0.5 MG PO TABS
0.5000 mg | ORAL_TABLET | Freq: Every day | ORAL | Status: DC | PRN
  Administered 2013-10-05: 0.5 mg via ORAL
  Filled 2013-10-04: qty 1

## 2013-10-04 MED ORDER — BISACODYL 10 MG RE SUPP
10.0000 mg | Freq: Every day | RECTAL | Status: DC | PRN
  Administered 2013-10-06: 10 mg via RECTAL
  Filled 2013-10-04: qty 1

## 2013-10-04 MED ORDER — VITAMIN B-1 100 MG PO TABS
100.0000 mg | ORAL_TABLET | Freq: Every day | ORAL | Status: DC
  Administered 2013-10-04 – 2013-10-06 (×3): 100 mg via ORAL
  Filled 2013-10-04 (×3): qty 1

## 2013-10-04 MED ORDER — SIMVASTATIN 10 MG PO TABS
10.0000 mg | ORAL_TABLET | Freq: Every day | ORAL | Status: DC
  Administered 2013-10-04 – 2013-10-05 (×2): 10 mg via ORAL
  Filled 2013-10-04 (×4): qty 1

## 2013-10-04 MED ORDER — BUDESONIDE-FORMOTEROL FUMARATE 160-4.5 MCG/ACT IN AERO
INHALATION_SPRAY | RESPIRATORY_TRACT | Status: AC
  Filled 2013-10-04: qty 6

## 2013-10-04 MED ORDER — LOSARTAN POTASSIUM 50 MG PO TABS
50.0000 mg | ORAL_TABLET | Freq: Every day | ORAL | Status: DC
  Administered 2013-10-05 – 2013-10-06 (×2): 50 mg via ORAL
  Filled 2013-10-04 (×3): qty 1

## 2013-10-04 NOTE — ED Notes (Signed)
States she was a church and became dizzy and nauseated, vomited x 1 , states she fell at home and hit her right side, denies any head injury

## 2013-10-04 NOTE — ED Provider Notes (Signed)
CSN: [id number]     Arrival date & time 10/04/13  1309 History   First MD Initiated Contact with Patient 10/04/13 1358     Chief Complaint  Patient presents with  . Near Syncope      HPI Pt was seen at 1400. Per pt and her family, c/o gradual onset and persistence of constant lightheadedness/near syncope that began this morning while sitting in church. Pt states she had one episode of N/V while in church. Pt's family states pt was walking into her house and "then just fell backwards." Pt does not recall events, stating she "just woke up on the floor." Denies CP/palpitations, no SOB/cough, no abd pain, no diarrhea, no black or blood in stools or emesis, no fevers, no rash, no seizure activity, no incont of bowel/bladder.    Past Medical History  Diagnosis Date  . Fluid retention   . Hypertension   . High cholesterol   . Depression   . Allergic rhinitis   . GERD (gastroesophageal reflux disease)   . Shortness of breath     with exertion  . Pelvic pain   . Arthritis     back  . History of blood transfusion   . Left bundle branch block     diagnosed '08; normal coronaries by cath 2010   Past Surgical History  Procedure Laterality Date  . Cholecystectomy  1991  . Abdominal hysterectomy      Many years ago  . Tonsillectomy    . Appendectomy    . Back surgery  07/21/2008  . Rotator cuff repair  2011    right  . Cataract extraction w/phaco  04/16/2011    Procedure: CATARACT EXTRACTION PHACO AND INTRAOCULAR LENS PLACEMENT (IOC);  Surgeon: Susa Simmonds;  Location: [facility];  Service: Ophthalmology;  Laterality: Left;  CDE=11.35  . Hammer toe surgery  11/22/11    [facility], [name]  . Cataract extraction w/phaco  01/28/2012    Procedure: CATARACT EXTRACTION PHACO AND INTRAOCULAR LENS PLACEMENT (IOC);  Surgeon: Susa Simmonds, MD;  Location: [facility];  Service: Ophthalmology;  Laterality: Right;  CDI:8.15  . Cysto with hydrodistension  05/29/2012    Procedure: CYSTOSCOPY/HYDRODISTENSION;   Surgeon: Martina Sinner, MD;  Location: [facility];  Service: Urology;  Laterality: N/A;  INSTILLATION OF MARCAINE AND PYRIDIUM   . Eye surgery    . Lumbar laminectomy/decompression microdiscectomy Left 06/06/2012    Procedure: LUMBAR LAMINECTOMY/DECOMPRESSION MICRODISCECTOMY 1 LEVEL;  Surgeon: Maeola Harman, MD;  Location: [facility];  Service: Neurosurgery;  Laterality: Left;  Left Lumbar two-three Laminectomy for resection of synovial cyst  . Cardiac catheterization  05/11/2008    NL coronaries, EF 60%  . Total hip arthroplasty Right 03/13/2013    Procedure: TOTAL HIP ARTHROPLASTY;  Surgeon: Nestor Lewandowsky, MD;  Location: [facility];  Service: Orthopedics;  Laterality: Right;   Family History  Problem Relation Age of Onset  . Colon cancer Neg Hx   . Anesthesia problems Neg Hx   . Hypotension Neg Hx   . Malignant hyperthermia Neg Hx   . Pseudochol deficiency Neg Hx   . Diabetes Other   . Allergies Other    History  Substance Use Topics  . Smoking status: Current Some Day Smoker -- 0.10 packs/day for 40 years    Types: Cigarettes  . Smokeless tobacco: Never Used     Comment: smokes 2 cigarettes a day  . Alcohol Use: No    Review of Systems ROS: Statement:  All systems negative except as marked or noted in the HPI; Constitutional: Negative for fever and chills. ; ; Eyes: Negative for eye pain, redness and discharge. ; ; ENMT: Negative for ear pain, hoarseness, nasal congestion, sinus pressure and sore throat. ; ; Cardiovascular: Negative for chest pain, palpitations, diaphoresis, dyspnea and peripheral edema. ; ; Respiratory: Negative for cough, wheezing and stridor. ; ; Gastrointestinal: Negative for nausea, vomiting, diarrhea, abdominal pain, blood in stool, hematemesis, jaundice and rectal bleeding. . ; ; Genitourinary: Negative for dysuria, flank pain and hematuria. ; ; Musculoskeletal: Negative for back pain and neck pain. Negative for swelling and deformity.; ; Skin:  Negative for pruritus, rash, abrasions, blisters, bruising and skin lesion.; ; Neuro: +lightheadedness, syncope. Negative for headache and neck stiffness. Negative for extremity weakness, paresthesias, involuntary movement, seizure.      Allergies  Codeine; Lisinopril-hydrochlorothiazide; and Penicillins  Home Medications   Prior to Admission medications   Medication Sig Start Date End Date Taking? Authorizing Provider  ALPRAZolam Prudy Feeler) 0.5 MG tablet Take 1 tablet (0.5 mg total) by mouth daily as needed for anxiety. 08/18/13  Yes Mae Shelda Altes, FNP  amitriptyline (ELAVIL) 25 MG tablet Take 1 tablet (25 mg total) by mouth at bedtime. 08/18/13  Yes Mae Shelda Altes, FNP  aspirin EC 81 MG tablet Take 81 mg by mouth daily.   Yes Historical Provider, MD  budesonide-formoterol (SYMBICORT) 160-4.5 MCG/ACT inhaler Inhale 1 puff into the lungs 2 (two) times daily. 08/18/13  Yes Mae Shelda Altes, FNP  buPROPion (WELLBUTRIN SR) 150 MG 12 hr tablet Take 1 tablet (150 mg total) by mouth 2 (two) times daily. 08/18/13  Yes Mae Shelda Altes, FNP  cetirizine (ZYRTEC) 10 MG tablet Take 1 tablet (10 mg total) by mouth daily. 08/18/13  Yes Mae Shelda Altes, FNP  esomeprazole (NEXIUM) 40 MG capsule Take 1 capsule (40 mg total) by mouth daily before breakfast. 08/18/13  Yes Coralie Keens, FNP  furosemide (LASIX) 20 MG tablet Take 1/2 tablet (10mg ) once daily 08/18/13  Yes Mae Shelda Altes, FNP  losartan (COZAAR) 50 MG tablet Take 1 tablet (50 mg total) by mouth daily. 08/18/13  Yes Mae Shelda Altes, FNP  mirabegron ER (MYRBETRIQ) 50 MG TB24 tablet Take 50 mg by mouth daily.   Yes Historical Provider, MD  raloxifene (EVISTA) 60 MG tablet Take 1 tablet (60 mg total) by mouth daily. 08/18/13  Yes Mae Shelda Altes, FNP  simvastatin (ZOCOR) 10 MG tablet Take 1 tablet (10 mg total) by mouth at bedtime. 08/18/13  Yes Mae Shelda Altes, FNP  vitamin B-12 (CYANOCOBALAMIN) 1000 MCG tablet Take 1,000 mcg by mouth daily.   Yes  Historical Provider, MD  Vitamin D, Ergocalciferol, (DRISDOL) 50000 UNITS CAPS capsule TAKE ONE CAPSULE BY MOUTH WEEKLY   Yes Christy A Hawks, FNP   BP 130/74  Pulse 85  Temp(Src) 97.7 F (36.5 C) (Oral)  Resp 18  Ht 5\' 3"  (1.6 m)  Wt 148 lb (67.132 kg)  BMI 26.22 kg/m2  SpO2 96% Physical Exam 1405: Physical examination:  Nursing notes reviewed; Vital signs and O2 SAT reviewed;  Constitutional: Well developed, Well nourished, Well hydrated, In no acute distress; Head:  Normocephalic, atraumatic; Eyes: EOMI, PERRL, No scleral icterus; ENMT: TM's clear bilat. Mouth and pharynx normal, Mucous membranes moist; Neck: Supple, Full range of motion, No lymphadenopathy; Cardiovascular: Regular rate and rhythm, No gallop; Respiratory: Breath sounds clear & equal bilaterally, No rales, rhonchi, wheezes.  Speaking full sentences with ease, Normal respiratory  effort/excursion; Chest: Nontender, Movement normal; Abdomen: Soft, Nontender, Nondistended, Normal bowel sounds; Genitourinary: No CVA tenderness; Extremities: Pulses normal, No tenderness, No edema, No calf edema or asymmetry.; Neuro: AA&Ox3, Major CN grossly intact.Speech clear.  No facial droop.  No nystagmus. Grips equal. Strength 5/5 equal bilat UE's and LE's.  DTR 2/4 equal bilat UE's and LE's.  No gross sensory deficits.  Normal cerebellar testing bilat UE's (finger-nose) and LE's (heel-shin)..; Skin: Color normal, Warm, Dry.   ED Course  Procedures     EKG Interpretation   Date/Time:  Sunday October 04 2013 13:42:45 EDT Ventricular Rate:  82 PR Interval:  163 QRS Duration: 136 QT Interval:  422 QTC Calculation: 493 R Axis:   55 Text Interpretation:  Sinus rhythm Left axis deviation Right atrial  enlargement Left bundle branch block When compared with ECG of 06/20/2011  No significant change was found Confirmed by [facility]  MD, Nicholos Johns  [phone]) on 10/04/2013 2:41:09 PM      MDM  MDM Reviewed: previous chart, nursing note and  vitals Reviewed previous: labs and ECG Interpretation: labs, ECG, x-ray and CT scan    Results for orders placed during the hospital encounter of 10/04/13  URINALYSIS, ROUTINE W REFLEX MICROSCOPIC      Result Value Ref Range   Color, Urine YELLOW  YELLOW   APPearance CLEAR  CLEAR   Specific Gravity, Urine 1.015  1.005 - 1.030   pH 6.5  5.0 - 8.0   Glucose, UA NEGATIVE  NEGATIVE mg/dL   Hgb urine dipstick NEGATIVE  NEGATIVE   Bilirubin Urine NEGATIVE  NEGATIVE   Ketones, ur NEGATIVE  NEGATIVE mg/dL   Protein, ur NEGATIVE  NEGATIVE mg/dL   Urobilinogen, UA 0.2  0.0 - 1.0 mg/dL   Nitrite NEGATIVE  NEGATIVE   Leukocytes, UA NEGATIVE  NEGATIVE  BASIC METABOLIC PANEL      Result Value Ref Range   Sodium 141  137 - 147 mEq/L   Potassium 3.9  3.7 - 5.3 mEq/L   Chloride 104  96 - 112 mEq/L   CO2 21  19 - 32 mEq/L   Glucose, Bld 108 (*) 70 - 99 mg/dL   BUN 20  6 - 23 mg/dL   Creatinine, Ser 6.30  0.50 - 1.10 mg/dL   Calcium 9.0  8.4 - 16.0 mg/dL   GFR calc non Af Amer 54 (*) >90 mL/min   GFR calc Af Amer 63 (*) >90 mL/min  CBC WITH DIFFERENTIAL      Result Value Ref Range   WBC 10.4  4.0 - 10.5 K/uL   RBC 4.42  3.87 - 5.11 MIL/uL   Hemoglobin 13.2  12.0 - 15.0 g/dL   HCT 10.9  32.3 - 55.7 %   MCV 87.6  78.0 - 100.0 fL   MCH 29.9  26.0 - 34.0 pg   MCHC 34.1  30.0 - 36.0 g/dL   RDW 32.2  02.5 - 42.7 %   Platelets 319  150 - 400 K/uL   Neutrophils Relative % 83 (*) 43 - 77 %   Neutro Abs 8.6 (*) 1.7 - 7.7 K/uL   Lymphocytes Relative 13  12 - 46 %   Lymphs Abs 1.4  0.7 - 4.0 K/uL   Monocytes Relative 4  3 - 12 %   Monocytes Absolute 0.4  0.1 - 1.0 K/uL   Eosinophils Relative 0  0 - 5 %   Eosinophils Absolute 0.0  0.0 - 0.7 K/uL   Basophils Relative  0  0 - 1 %   Basophils Absolute 0.0  0.0 - 0.1 K/uL  TROPONIN I      Result Value Ref Range   Troponin I <0.30  <0.30 ng/mL   Dg Chest 2 View 10/04/2013   CLINICAL DATA:  Weakness and dizziness.  EXAM: CHEST  2 VIEW  COMPARISON:   03/05/2013  FINDINGS: Lungs are adequately inflated as a lordotic technique is demonstrated on the frontal exam. There is no consolidation or effusion. There is borderline cardiomegaly. Mild streaky density in the right retrocardiac region not well seen on the lateral film likely vascular crowding. Minimal calcified plaque over the aortic arch. Remainder the exam is unchanged.  IMPRESSION: No active cardiopulmonary disease.   Electronically Signed   By: Elberta Fortis M.D.   On: 10/04/2013 15:44   Ct Head Wo Contrast 10/04/2013   CLINICAL DATA:  Near syncope  EXAM: CT HEAD WITHOUT CONTRAST  TECHNIQUE: Contiguous axial images were obtained from the base of the skull through the vertex without intravenous contrast.  COMPARISON:  None.  FINDINGS: The ventricles are normal in size and configuration. There is, however, mild parietal lobe atrophy bilaterally. There is also mild anterior temporal lobe atrophy on the left. There is a cavum vergae, an anatomic variant. There is no demonstrable mass, hemorrhage, extra-axial fluid collection, or midline shift. There is minimal small vessel disease in the centra semiovale bilaterally. There is no acute appearing infarct. Bony calvarium appears intact. The mastoid air cells are clear.  IMPRESSION: Mild atrophy and minimal small vessel disease in the periventricular white matter. No intracranial mass, hemorrhage, or acute appearing infarct.   Electronically Signed   By: Bretta Bang M.D.   On: 10/04/2013 15:43    1630:  Pt not orthostatic on VS. Neuro exam unchanged. Dx and testing d/w pt and family.  Questions answered.  Verb understanding, agreeable to admit. T/C to [facility]. Welton Flakes, case discussed, including:  HPI, pertinent PM/SHx, VS/PE, dx testing, ED course and treatment:  Agreeable to admit.    Laray Anger, DO 10/06/13 1149

## 2013-10-04 NOTE — H&P (Signed)
[facility] History and Physical  SELINDA KORZENIEWSKI TFT:[id number] DOB: 1935/01/12 DOA: 10/04/2013  Referring physician: Dr Clarene Duke PCP: Rudi Heap, MD   Chief Complaint: Syncope  HPI: [name] is a 79 y.o. female states that she was in church and started to feel faint. Patient states that she had no headaches. She did not actually lose consciousness and she did not have any seizure like activity. Patient states that she had no chest pain noted. She did have some nausea and she did vomit clear looking material. Patient had no abdominal pain. She states that she is still feeling a little queezy. She has a history of hypertension and also has hyperlipidemia. She has been taking all her medications as prescribed.   Review of Systems:  Constitutional:  No weight loss, night sweats, Fevers, chills, fatigue.  HEENT:  No headaches, Sore throat,  No sneezing, itching, ear ache, nasal congestion, post nasal drip,  Cardio-vascular:  No chest pain, Orthopnea, PND palpitations  GI:  No heartburn, indigestion, abdominal pain, ++nausea, ++vomiting, no diarrhea, change in bowel habits, loss of appetite  Resp:  No shortness of breath with exertion or at rest. No excess mucus, no productive cough, No non-productive cough Skin:  no rash or lesions.  GU:  no dysuria, change in color of urine, no urgency or frequency. No flank pain.  Musculoskeletal:  No joint pain or swelling. No decreased range of motion. No back pain.  Psych:  No change in mood or affect. No depression or anxiety. No memory loss.   Past Medical History  Diagnosis Date  . Fluid retention   . Hypertension   . High cholesterol   . Depression   . Allergic rhinitis   . GERD (gastroesophageal reflux disease)   . Shortness of breath     with exertion  . Pelvic pain   . Arthritis     back  . History of blood transfusion   . Left bundle branch block     diagnosed '08; normal coronaries by cath 2010   Past  Surgical History  Procedure Laterality Date  . Cholecystectomy  1991  . Abdominal hysterectomy      Many years ago  . Tonsillectomy    . Appendectomy    . Back surgery  07/21/2008  . Rotator cuff repair  2011    right  . Cataract extraction w/phaco  04/16/2011    Procedure: CATARACT EXTRACTION PHACO AND INTRAOCULAR LENS PLACEMENT (IOC);  Surgeon: Susa Simmonds;  Location: [facility];  Service: Ophthalmology;  Laterality: Left;  CDE=11.35  . Hammer toe surgery  11/22/11    [facility], [name]  . Cataract extraction w/phaco  01/28/2012    Procedure: CATARACT EXTRACTION PHACO AND INTRAOCULAR LENS PLACEMENT (IOC);  Surgeon: Susa Simmonds, MD;  Location: [facility];  Service: Ophthalmology;  Laterality: Right;  CDI:8.15  . Cysto with hydrodistension  05/29/2012    Procedure: CYSTOSCOPY/HYDRODISTENSION;  Surgeon: Martina Sinner, MD;  Location: [facility];  Service: Urology;  Laterality: N/A;  INSTILLATION OF MARCAINE AND PYRIDIUM   . Eye surgery    . Lumbar laminectomy/decompression microdiscectomy Left 06/06/2012    Procedure: LUMBAR LAMINECTOMY/DECOMPRESSION MICRODISCECTOMY 1 LEVEL;  Surgeon: Maeola Harman, MD;  Location: [facility];  Service: Neurosurgery;  Laterality: Left;  Left Lumbar two-three Laminectomy for resection of synovial cyst  . Cardiac catheterization  05/11/2008    NL coronaries, EF 60%  . Total hip arthroplasty Right 03/13/2013    Procedure: TOTAL HIP  ARTHROPLASTY;  Surgeon: Nestor LewandowskyFrank J Rowan, MD;  [facility];  Service: Orthopedics;  Laterality: Right;   Social History:  reports that she has been smoking Cigarettes.  She has a 4 pack-year smoking history. She has never used smokeless tobacco. She reports that she does not drink alcohol or use illicit drugs.  Allergies  Allergen Reactions  . Codeine Itching and Swelling    eye irritation (prescribed percocet)  . Lisinopril-Hydrochlorothiazide     Lip swelling. Stopped by ENT. Patient is on Losartan without any  issues   . Penicillins Itching and Swelling    eye irritation    Family History  Problem Relation Age of Onset  . Colon cancer Neg Hx   . Anesthesia problems Neg Hx   . Hypotension Neg Hx   . Malignant hyperthermia Neg Hx   . Pseudochol deficiency Neg Hx   . Diabetes Other   . Allergies Other      Prior to Admission medications   Medication Sig Start Date End Date Taking? Authorizing Provider  ALPRAZolam Prudy Feeler(XANAX) 0.5 MG tablet Take 1 tablet (0.5 mg total) by mouth daily as needed for anxiety. 08/18/13  Yes Mae Shelda AltesE [place], FNP  amitriptyline (ELAVIL) 25 MG tablet Take 1 tablet (25 mg total) by mouth at bedtime. 08/18/13  Yes Mae Shelda AltesE [place], FNP  aspirin EC 81 MG tablet Take 81 mg by mouth daily.   Yes Historical Provider, MD  budesonide-formoterol (SYMBICORT) 160-4.5 MCG/ACT inhaler Inhale 1 puff into the lungs 2 (two) times daily. 08/18/13  Yes Mae Shelda AltesE [place], FNP  buPROPion (WELLBUTRIN SR) 150 MG 12 hr tablet Take 1 tablet (150 mg total) by mouth 2 (two) times daily. 08/18/13  Yes Mae Shelda AltesE [place], FNP  cetirizine (ZYRTEC) 10 MG tablet Take 1 tablet (10 mg total) by mouth daily. 08/18/13  Yes Mae Shelda AltesE [place], FNP  esomeprazole (NEXIUM) 40 MG capsule Take 1 capsule (40 mg total) by mouth daily before breakfast. 08/18/13  Yes Coralie KeensMae E [place]ib[place]ton, FNP  furosemide (LASIX) 20 MG tablet Take 1/2 tablet (10mg ) once daily 08/18/13  Yes Mae Shelda AltesE [place], FNP  losartan (COZAAR) 50 MG tablet Take 1 tablet (50 mg total) by mouth daily. 08/18/13  Yes Mae Shelda AltesE [place], FNP  mirabegron ER (MYRBETRIQ) 50 MG TB24 tablet Take 50 mg by mouth daily.   Yes Historical Provider, MD  raloxifene (EVISTA) 60 MG tablet Take 1 tablet (60 mg total) by mouth daily. 08/18/13  Yes Mae Shelda AltesE [place], FNP  simvastatin (ZOCOR) 10 MG tablet Take 1 tablet (10 mg total) by mouth at bedtime. 08/18/13  Yes Mae Shelda AltesE [place], FNP  vitamin B-12 (CYANOCOBALAMIN) 1000 MCG tablet Take 1,000 mcg by mouth daily.   Yes Historical Provider,  MD  Vitamin D, Ergocalciferol, (DRISDOL) 50000 UNITS CAPS capsule TAKE ONE CAPSULE BY MOUTH WEEKLY   Yes Junie Spencerhristy A Hawks, FNP   Physical Exam: Filed Vitals:   10/04/13 1607  BP: 130/74  Pulse: 85  Temp:   Resp:     BP 130/74  Pulse 85  Temp(Src) 97.7 F (36.5 C) (Oral)  Resp 18  Ht 5\' 3"  (1.6 m)  Wt 67.132 kg (148 lb)  BMI 26.22 kg/m2  SpO2 96%  General:  Appears calm and comfortable Eyes: PERRL, normal lids, irises & conjunctiva ENT: grossly normal hearing, lips & tongue Neck: no LAD, masses or thyromegaly Cardiovascular: RRR, no m/r/g. No LE edema. Respiratory: CTA bilaterally, no w/r/r. Normal respiratory effort. Abdomen: soft, ntnd Skin: no rash or induration  seen on limited exam Musculoskeletal: grossly normal tone BUE/BLE Psychiatric: grossly normal mood and affect, speech fluent and appropriate Neurologic: grossly non-focal.          Labs on Admission:  Basic Metabolic Panel:  Recent Labs Lab 10/04/13 1348  NA 141  K 3.9  CL 104  CO2 21  GLUCOSE 108*  BUN 20  CREATININE 0.99  CALCIUM 9.0   Liver Function Tests: No results found for this basename: AST, ALT, ALKPHOS, BILITOT, PROT, ALBUMIN,  in the last 168 hours No results found for this basename: LIPASE, AMYLASE,  in the last 168 hours No results found for this basename: AMMONIA,  in the last 168 hours CBC:  Recent Labs Lab 10/04/13 1348  WBC 10.4  NEUTROABS 8.6*  HGB 13.2  HCT 38.7  MCV 87.6  PLT 319   Cardiac Enzymes:  Recent Labs Lab 10/04/13 1348  TROPONINI <0.30    BNP (last 3 results) No results found for this basename: PROBNP,  in the last 8760 hours CBG: No results found for this basename: GLUCAP,  in the last 168 hours  Radiological Exams on Admission: Dg Chest 2 View  10/04/2013   CLINICAL DATA:  Weakness and dizziness.  EXAM: CHEST  2 VIEW  COMPARISON:  03/05/2013  FINDINGS: Lungs are adequately inflated as a lordotic technique is demonstrated on the frontal exam.  There is no consolidation or effusion. There is borderline cardiomegaly. Mild streaky density in the right retrocardiac region not well seen on the lateral film likely vascular crowding. Minimal calcified plaque over the aortic arch. Remainder the exam is unchanged.  IMPRESSION: No active cardiopulmonary disease.   Electronically Signed   By: Elberta Fortis M.D.   On: 10/04/2013 15:44   Ct Head Wo Contrast  10/04/2013   CLINICAL DATA:  Near syncope  EXAM: CT HEAD WITHOUT CONTRAST  TECHNIQUE: Contiguous axial images were obtained from the base of the skull through the vertex without intravenous contrast.  COMPARISON:  None.  FINDINGS: The ventricles are normal in size and configuration. There is, however, mild parietal lobe atrophy bilaterally. There is also mild anterior temporal lobe atrophy on the left. There is a cavum vergae, an anatomic variant. There is no demonstrable mass, hemorrhage, extra-axial fluid collection, or midline shift. There is minimal small vessel disease in the centra semiovale bilaterally. There is no acute appearing infarct. Bony calvarium appears intact. The mastoid air cells are clear.  IMPRESSION: Mild atrophy and minimal small vessel disease in the periventricular white matter. No intracranial mass, hemorrhage, or acute appearing infarct.   Electronically Signed   By: Bretta Bang M.D.   On: 10/04/2013 15:43    EKG: Independently reviewed. NSR no acute changes  Assessment/Plan Principal Problem:   Syncope Active Problems:   HYPERCHOLESTEROLEMIA   HYPERTENSION   OSTEOARTHRITIS   1. Syncope -unclear etiology may be simple middle ear issues -will admit to telemetry -will get serial enzymes -get an echo and carotid doppler -will hydrate with IVF  2. Hypertension -will continue with home medications -hold lasix for now -monitor pressure and orthostatics  3. Hypercholestorlemia -continue with home medications  4. Osteoparthritis -currently stable    Code  Status: Full code (must indicate code status--if unknown or must be presumed, indicate so) Family Communication: Daughter in room (indicate person spoken with, if applicable, with phone number if by telephone) Disposition Plan: Home (indicate anticipated LOS)  Time spent:  Yevonne Pax [facility] Pager [phone]  **Disclaimer: This note  may have been dictated with voice recognition software. Similar sounding words can inadvertently be transcribed and this note may contain transcription errors which may not have been corrected upon publication of note.**

## 2013-10-05 ENCOUNTER — Observation Stay (HOSPITAL_COMMUNITY): Payer: Medicare Other

## 2013-10-05 DIAGNOSIS — R55 Syncope and collapse: Principal | ICD-10-CM

## 2013-10-05 DIAGNOSIS — I10 Essential (primary) hypertension: Secondary | ICD-10-CM

## 2013-10-05 DIAGNOSIS — R5383 Other fatigue: Secondary | ICD-10-CM

## 2013-10-05 DIAGNOSIS — R5381 Other malaise: Secondary | ICD-10-CM

## 2013-10-05 DIAGNOSIS — I359 Nonrheumatic aortic valve disorder, unspecified: Secondary | ICD-10-CM

## 2013-10-05 LAB — COMPREHENSIVE METABOLIC PANEL
ALBUMIN: 3 g/dL — AB (ref 3.5–5.2)
ALT: 11 U/L (ref 0–35)
AST: 17 U/L (ref 0–37)
Alkaline Phosphatase: 89 U/L (ref 39–117)
BUN: 15 mg/dL (ref 6–23)
CALCIUM: 8.2 mg/dL — AB (ref 8.4–10.5)
CHLORIDE: 110 meq/L (ref 96–112)
CO2: 23 mEq/L (ref 19–32)
Creatinine, Ser: 0.97 mg/dL (ref 0.50–1.10)
GFR calc Af Amer: 64 mL/min — ABNORMAL LOW (ref >90)
GFR calc non Af Amer: 55 mL/min — ABNORMAL LOW (ref >90)
Glucose, Bld: 85 mg/dL (ref 70–99)
Potassium: 4.1 mEq/L (ref 3.7–5.3)
Sodium: 146 mEq/L (ref 137–147)
Total Bilirubin: 0.3 mg/dL (ref 0.3–1.2)
Total Protein: 6 g/dL (ref 6.0–8.3)

## 2013-10-05 LAB — CBC
HCT: 36.2 % (ref 36.0–46.0)
HEMOGLOBIN: 12.1 g/dL (ref 12.0–15.0)
MCH: 29.4 pg (ref 26.0–34.0)
MCHC: 33.4 g/dL (ref 30.0–36.0)
MCV: 88.1 fL (ref 78.0–100.0)
Platelets: 296 10*3/uL (ref 150–400)
RBC: 4.11 MIL/uL (ref 3.87–5.11)
RDW: 14.4 % (ref 11.5–15.5)
WBC: 4.4 10*3/uL (ref 4.0–10.5)

## 2013-10-05 LAB — GLUCOSE, CAPILLARY: Glucose-Capillary: 89 mg/dL (ref 70–99)

## 2013-10-05 LAB — HEMOGLOBIN A1C
HEMOGLOBIN A1C: 5.8 % — AB (ref <5.7)
MEAN PLASMA GLUCOSE: 120 mg/dL — AB (ref <117)

## 2013-10-05 LAB — URINE CULTURE
Colony Count: NO GROWTH
Culture: NO GROWTH

## 2013-10-05 LAB — TROPONIN I: Troponin I: <0.3 ng/mL (ref <0.30)

## 2013-10-05 LAB — TSH: TSH: 0.666 u[IU]/mL (ref 0.350–4.500)

## 2013-10-05 NOTE — Discharge Summary (Addendum)
Physician Discharge Summary  [name] ZJQ:[id number] DOB: Oct 16, 1934 DOA: 10/04/2013  PCP: Rudi Heap, MD  Admit date: 10/04/2013 Discharge date: 10/06/2013  Time spent: 35 minutes  Recommendations for Outpatient Follow-up:  1. Patient hospitalized after having a presyncopal event, will need close outpatient follow up.   Discharge Diagnoses:  Principal Problem:   Dizziness Active Problems:   HYPERCHOLESTEROLEMIA   HYPERTENSION   OSTEOARTHRITIS   Secondary cardiomyopathy   Left bundle branch block   Discharge Condition: Stable  Diet recommendation: Heart Healthy  Filed Weights   10/04/13 1333 10/04/13 1829 10/05/13 0528  Weight: 67.132 kg (148 lb) 68.856 kg (151 lb 12.8 oz) 68.765 kg (151 lb 9.6 oz)    History of present illness:  [name] is a 79 y.o. female states that she was in church and started to feel faint. Patient states that she had no headaches. She did not actually lose consciousness and she did not have any seizure like activity. Patient states that she had no chest pain noted. She did have some nausea and she did vomit clear looking material. Patient had no abdominal pain. She states that she is still feeling a little queezy. She has a history of hypertension and also has hyperlipidemia. She has been taking all her medications as prescribed.  Hospital Course:  Patient is a 79 year female with a past medical history of hypertension, dyslipidemia, history of fluid retention who presented to the Emergency Department at [facility] of 10/04/2013 after having a presyncopal even at church. She reported feeling faint while at church. Denied prior episodes, chest pain, palpitations, fevers, chills, shortness of breath, nausea, vomiting, focal deficits, seizure activity prior to event. She was placed in overnight observation. No changes were seen on telemetry, troponins remained negative times 3 sets, Head CT did not show acute intracranial changes and  orthostatics were negative. She was given IV fluids as lasix was stopped. By the following morning she reported feeling well and at her baseline.   Addenum: Given her change in ejection fraction on 2D echo performed during this hospitalization she was kept overnight to be evaluated by Cardiology. She had a cardiac catheterization in 2010 which showed normal coronary arteries. She did not have changes on telemetry, denied CP and troponins remained negative. It was felt this could represent nonischemic cardiomyopathy. Plan discussed with cardiology, recommended starting coreg 3.125 mg PO BID. She was discharged home in stable condition on 10/06/2013 in stable condition to follow up with her her cardiologist.    Discharge Exam: Filed Vitals:   10/06/13 0530  BP: 134/81  Pulse: 67  Temp: 98.1 F (36.7 C)  Resp: 20    General: Sitting at side of bed, in no acute distress, awake and alert Cardiovascular: Regular rate and rhythm, normal S1S2 Respiratory: Clear to auscultation Abdomen: Soft, nontender, nondistended  Discharge Instructions You were cared for by a hospitalist during your hospital stay. If you have any questions about your discharge medications or the care you received while you were in the hospital after you are discharged, you can call the unit and asked to speak with the hospitalist on call if the hospitalist that took care of you is not available. Once you are discharged, your primary care physician will handle any further medical issues. Please note that NO REFILLS for any discharge medications will be authorized once you are discharged, as it is imperative that you return to your primary care physician (or establish a relationship with a primary  care physician if you do not have one) for your aftercare needs so that they can reassess your need for medications and monitor your lab values.      Discharge Instructions   Call MD for:  difficulty breathing, headache or visual  disturbances    Complete by:  As directed      Call MD for:  extreme fatigue    Complete by:  As directed      Call MD for:  persistant dizziness or light-headedness    Complete by:  As directed      Call MD for:  persistant nausea and vomiting    Complete by:  As directed      Call MD for:  severe uncontrolled pain    Complete by:  As directed      Call MD for:  temperature >100.4    Complete by:  As directed      Diet - low sodium heart healthy    Complete by:  As directed      Increase activity slowly    Complete by:  As directed             Medication List    STOP taking these medications       furosemide 20 MG tablet  Commonly known as:  LASIX      TAKE these medications       ALPRAZolam 0.5 MG tablet  Commonly known as:  XANAX  Take 1 tablet (0.5 mg total) by mouth daily as needed for anxiety.     amitriptyline 25 MG tablet  Commonly known as:  ELAVIL  Take 1 tablet (25 mg total) by mouth at bedtime.     aspirin EC 81 MG tablet  Take 81 mg by mouth daily.     budesonide-formoterol 160-4.5 MCG/ACT inhaler  Commonly known as:  SYMBICORT  Inhale 1 puff into the lungs 2 (two) times daily.     buPROPion 150 MG 12 hr tablet  Commonly known as:  WELLBUTRIN SR  Take 1 tablet (150 mg total) by mouth 2 (two) times daily.     carvedilol 3.125 MG tablet  Commonly known as:  COREG  Take 1 tablet (3.125 mg total) by mouth 2 (two) times daily with a meal.     cetirizine 10 MG tablet  Commonly known as:  ZYRTEC  Take 1 tablet (10 mg total) by mouth daily.     esomeprazole 40 MG capsule  Commonly known as:  NEXIUM  Take 1 capsule (40 mg total) by mouth daily before breakfast.     losartan 50 MG tablet  Commonly known as:  COZAAR  Take 1 tablet (50 mg total) by mouth daily.     MYRBETRIQ 50 MG Tb24 tablet  Generic drug:  mirabegron ER  Take 50 mg by mouth daily.     raloxifene 60 MG tablet  Commonly known as:  EVISTA  Take 1 tablet (60 mg total) by mouth  daily.     simvastatin 10 MG tablet  Commonly known as:  ZOCOR  Take 1 tablet (10 mg total) by mouth at bedtime.     vitamin B-12 1000 MCG tablet  Commonly known as:  CYANOCOBALAMIN  Take 1,000 mcg by mouth daily.     Vitamin D (Ergocalciferol) 50000 UNITS Caps capsule  Commonly known as:  DRISDOL  TAKE ONE CAPSULE BY MOUTH WEEKLY       Allergies  Allergen Reactions  . Codeine Itching and Swelling  eye irritation (prescribed percocet)  . Lisinopril-Hydrochlorothiazide     Lip swelling. Stopped by ENT. Patient is on Losartan without any issues   . Penicillins Itching and Swelling    eye irritation      The results of significant diagnostics from this hospitalization (including imaging, microbiology, ancillary and laboratory) are listed below for reference.    Significant Diagnostic Studies: Dg Chest 2 View  10/04/2013   CLINICAL DATA:  Weakness and dizziness.  EXAM: CHEST  2 VIEW  COMPARISON:  03/05/2013  FINDINGS: Lungs are adequately inflated as a lordotic technique is demonstrated on the frontal exam. There is no consolidation or effusion. There is borderline cardiomegaly. Mild streaky density in the right retrocardiac region not well seen on the lateral film likely vascular crowding. Minimal calcified plaque over the aortic arch. Remainder the exam is unchanged.  IMPRESSION: No active cardiopulmonary disease.   Electronically Signed   By: Elberta Fortis M.D.   On: 10/04/2013 15:44   Ct Head Wo Contrast  10/04/2013   CLINICAL DATA:  Near syncope  EXAM: CT HEAD WITHOUT CONTRAST  TECHNIQUE: Contiguous axial images were obtained from the base of the skull through the vertex without intravenous contrast.  COMPARISON:  None.  FINDINGS: The ventricles are normal in size and configuration. There is, however, mild parietal lobe atrophy bilaterally. There is also mild anterior temporal lobe atrophy on the left. There is a cavum vergae, an anatomic variant. There is no demonstrable mass,  hemorrhage, extra-axial fluid collection, or midline shift. There is minimal small vessel disease in the centra semiovale bilaterally. There is no acute appearing infarct. Bony calvarium appears intact. The mastoid air cells are clear.  IMPRESSION: Mild atrophy and minimal small vessel disease in the periventricular white matter. No intracranial mass, hemorrhage, or acute appearing infarct.   Electronically Signed   By: Bretta Bang M.D.   On: 10/04/2013 15:43    Microbiology: Recent Results (from the past 240 hour(s))  URINE CULTURE     Status: None   Collection Time    10/04/13  2:57 PM      Result Value Ref Range Status   Specimen Description URINE, CATHETERIZED   Final   Special Requests NONE   Final   Culture  Setup Time     Final   Value: 10/04/2013 21:03     Performed at [facility]   Colony Count     Final   Value: NO GROWTH     Performed at [facility]   Culture     Final   Value: NO GROWTH     Performed at [facility]   Report Status 10/05/2013 FINAL   Final     Labs: Basic Metabolic Panel:  Recent Labs Lab 10/04/13 1348 10/05/13 0713  NA 141 146  K 3.9 4.1  CL 104 110  CO2 21 23  GLUCOSE 108* 85  BUN 20 15  CREATININE 0.99 0.97  CALCIUM 9.0 8.2*   Liver Function Tests:  Recent Labs Lab 10/05/13 0713  AST 17  ALT 11  ALKPHOS 89  BILITOT 0.3  PROT 6.0  ALBUMIN 3.0*   No results found for this basename: LIPASE, AMYLASE,  in the last 168 hours No results found for this basename: AMMONIA,  in the last 168 hours CBC:  Recent Labs Lab 10/04/13 1348 10/05/13 0713  WBC 10.4 4.4  NEUTROABS 8.6*  --   HGB 13.2 12.1  HCT 38.7 36.2  MCV 87.6 88.1  PLT 319 296   Cardiac Enzymes:  Recent Labs Lab 10/04/13 1348 10/04/13 1717 10/04/13 2234 10/05/13 0713  TROPONINI <0.30 <0.30 <0.30 <0.30   BNP: BNP (last 3 results) No results found for this basename: PROBNP,  in the last 8760 hours CBG:  Recent Labs Lab  10/05/13 0728 10/06/13 0725  GLUCAP 89 78       Signed:  Shariah Assad  [facility] 10/06/2013, 9:26 AM

## 2013-10-05 NOTE — Progress Notes (Signed)
Dr. Vanessa Barbara paged regarding echo and carotid US. Will hold dc for today and get cardio consult per physician

## 2013-10-05 NOTE — Progress Notes (Signed)
Utilization Review Complete  

## 2013-10-05 NOTE — Clinical Social Work Note (Signed)
CSW received referral for advance directives. Provided packet and briefly discussed living will and HCPOA. Pt reports she is going home today and would like to take some time to look over and decide if she is interested in completing. No other needs reported. CSW will sign off.  Derenda Fennel, [place] [phone]

## 2013-10-05 NOTE — Progress Notes (Signed)
Followup note  Her transthoracic echocardiogram was followed up. She was admitted for presyncope. 2-D echo was ordered as part of her workup which showed an ejection fraction of 40-45% with diffuse hypokinesis. Prior to today her last echo was done in 2008 which showed an EF of 55-60%. I discussed results with patient and recommended that she stay the night until cardiology evaluates her. Order placed for cardiology consultation.

## 2013-10-05 NOTE — Progress Notes (Signed)
  Echocardiogram 2D Echocardiogram has been performed.  Renae Fickle Khaden Gater 10/05/2013, 4:01 PM

## 2013-10-06 ENCOUNTER — Encounter (HOSPITAL_COMMUNITY): Payer: Self-pay | Admitting: Cardiology

## 2013-10-06 DIAGNOSIS — R42 Dizziness and giddiness: Secondary | ICD-10-CM

## 2013-10-06 DIAGNOSIS — I447 Left bundle-branch block, unspecified: Secondary | ICD-10-CM

## 2013-10-06 DIAGNOSIS — I429 Cardiomyopathy, unspecified: Secondary | ICD-10-CM

## 2013-10-06 LAB — GLUCOSE, CAPILLARY: Glucose-Capillary: 78 mg/dL (ref 70–99)

## 2013-10-06 MED ORDER — CARVEDILOL 3.125 MG PO TABS: 3.1250 mg | ORAL_TABLET | Freq: Two times a day (BID) | ORAL | Status: DC

## 2013-10-06 NOTE — Consult Note (Signed)
CARDIOLOGY CONSULT NOTE   Patient ID: [name] MRN: [id number] DOB/AGE: [birth year] 79 y.o.  Admit Date: 10/04/2013 Referring Physician: PTH Primary Physician: Rudi Heap, MD Consulting Cardiologist: Nona Dell MD Primary Cardiologist  Sarina Ser MD Reason for Consultation: Abnormal Echocardiogram revealing systolic dysfunction.  Clinical Summary [name] is a 79 y.o.female with clinical history of hypertension, hypercholesterolemia, GERD,left bundle branch block, cardiac catheterization in 2010 revealing normal coronary anatomy. She was admitted after feeling faint while in church, but did not lose consciousness.  She states she is gotten up early around 5 AM, while at church she had a sudden  feeling of warmth and flushing with diaphoresis, dizziness, and low-grade nausea. She states she had some significant dizziness. After leaving her car to go in her home after church she had a near syncopal episode, with 2 children holding her up to keep her from falling. She states she has these episodes maybe once every couple of months. She denied chest pain, shortness of breath, or actual loss of consciousness. Patient states that she did not eat much prior to going to church, a piece of cheese and a cup of coffee.  On arrival to the emergency room the patient's blood pressure was 115/60 heart rate 85 respirations 18 temperature 97.7. Her last revealed an elevated hemoglobin A1c of 5.8 other labs are found be unremarkable with exception of mild glucose elevation of 108. She was not found to be anemic. Cardiac enzyme was negative. CT scan of the head was negative for acute intracranial abnormality. She did have ICA stenosis but this was not hemodynamically significant. Also showed some mild atrophy with minimal small vessel disease. X-ray did not reveal active cardiopulmonary disease. EKG revealed sinus rhythm with left bundle branch block rate of 84 beats per minute.  The  patient was planned for discharge on 10/05/2013. Echocardiogram  was found to be abnormal with decrease in systolic function, 40-45% with diffuse hypokinesis, with grade 1 diastolic dysfunction. There are no valvular abnormalities, she had mildly elevated PA pressure 39 mm of mercury. As a result of this abnormality we are asked for further recommendations prior to her discharge.  Of note, the patient states she has been having significant memory deficits over the last 2 months. Since she will having someone her to get where she is going to forget how to go home. This has been unnerving for her. She has not had a full neuro workup. She has been advised to talk with her primary care about this.   Allergies  Allergen Reactions  . Codeine Itching and Swelling    eye irritation (prescribed percocet)  . Lisinopril-Hydrochlorothiazide     Lip swelling. Stopped by ENT. Patient is on Losartan without any issues   . Penicillins Itching and Swelling    eye irritation    Medications Scheduled Medications: . amitriptyline  25 mg Oral QHS  . aspirin EC  81 mg Oral Daily  . budesonide-formoterol  1 puff Inhalation BID  . buPROPion  150 mg Oral BID  . docusate sodium  100 mg Oral BID  . folic acid  1 mg Oral Daily  . heparin  5,000 Units Subcutaneous 3 times per day  . loratadine  10 mg Oral Daily  . losartan  50 mg Oral Daily  . mirabegron ER  50 mg Oral Daily  . multivitamin with minerals  1 tablet Oral Daily  . pantoprazole  80 mg Oral Q1200  . pneumococcal 23 valent vaccine  0.5 mL Intramuscular Tomorrow-1000  . raloxifene  60 mg Oral Daily  . simvastatin  10 mg Oral QHS  . sodium chloride  3 mL Intravenous Q12H  . thiamine  100 mg Oral Daily  . vitamin B-12  1,000 mcg Oral Daily  . Vitamin D (Ergocalciferol)  50,000 Units Oral Q7 days    PRN Medications: acetaminophen, acetaminophen, ALPRAZolam, alum & mag hydroxide-simeth, bisacodyl, ondansetron (ZOFRAN) IV, ondansetron   Past  Medical History  Diagnosis Date  . Essential hypertension, benign   . Mixed hyperlipidemia   . Depression   . Allergic rhinitis   . GERD (gastroesophageal reflux disease)   . Pelvic pain   . Arthritis   . History of blood transfusion   . Left bundle branch block   . History of cardiac catheterization     Normal coronaries 2010  . Secondary cardiomyopathy     LVEF 40-45%, likely nonischemic    Past Surgical History  Procedure Laterality Date  . Cholecystectomy  1991  . Abdominal hysterectomy      Many years ago  . Tonsillectomy    . Appendectomy    . Back surgery  07/21/2008  . Rotator cuff repair  2011    right  . Cataract extraction w/phaco  04/16/2011    Procedure: CATARACT EXTRACTION PHACO AND INTRAOCULAR LENS PLACEMENT (IOC);  Surgeon: Susa Simmonds;  Location: [facility];  Service: Ophthalmology;  Laterality: Left;  CDE=11.35  . Hammer toe surgery  11/22/11    [facility], [name]  . Cataract extraction w/phaco  01/28/2012    Procedure: CATARACT EXTRACTION PHACO AND INTRAOCULAR LENS PLACEMENT (IOC);  Surgeon: Susa Simmonds, MD;  Location: [facility];  Service: Ophthalmology;  Laterality: Right;  CDI:8.15  . Cysto with hydrodistension  05/29/2012    Procedure: CYSTOSCOPY/HYDRODISTENSION;  Surgeon: Martina Sinner, MD;  [facility];  Service: Urology;  Laterality: N/A;  INSTILLATION OF MARCAINE AND PYRIDIUM   . Eye surgery    . Lumbar laminectomy/decompression microdiscectomy Left 06/06/2012    Procedure: LUMBAR LAMINECTOMY/DECOMPRESSION MICRODISCECTOMY 1 LEVEL;  Surgeon: Maeola Harman, MD;  Location: [facility];  Service: Neurosurgery;  Laterality: Left;  Left Lumbar two-three Laminectomy for resection of synovial cyst  . Total hip arthroplasty Right 03/13/2013    Procedure: TOTAL HIP ARTHROPLASTY;  Surgeon: Nestor Lewandowsky, MD;  Location: [facility];  Service: Orthopedics;  Laterality: Right;    Family History  Problem Relation Age of Onset  . Colon cancer Neg Hx    . Anesthesia problems Neg Hx   . Hypotension Neg Hx   . Malignant hyperthermia Neg Hx   . Pseudochol deficiency Neg Hx   . Diabetes Other   . Allergies Other     Social History [name] reports that she has been smoking Cigarettes.  She has a 4 pack-year smoking history. She has never used smokeless tobacco. [name] reports that she does not drink alcohol.  Review of Systems Otherwise reviewed and negative except as outlined.  Physical Examination Blood pressure 134/81, pulse 67, temperature 98.1 F (36.7 C), temperature source Oral, resp. rate 20, height 5\' 3"  (1.6 m), weight 151 lb 9.6 oz (68.765 kg), SpO2 99.00%.  Intake/Output Summary (Last 24 hours) at 10/06/13 0908 Last data filed at 10/05/13 2200  Gross per 24 hour  Intake    924 ml  Output      0 ml  Net    924 ml    Telemetry: Normal sinus rhythm left  bundle branch block rate 67-73 beats per minute.  GEN: She is comfortable and in no acute distress without complaints of dizziness  HEENT: Conjunctiva and lids normal, oropharynx clear with moist mucosa. Neck: Supple, no elevated JVP or carotid bruits, no thyromegaly. Lungs: Clear to auscultation, nonlabored breathing at rest. Cardiac: Regular rate and rhythm, distant heart sounds no S3 or significant systolic murmur, no pericardial rub. Abdomen: Soft, nontender, no hepatomegaly, bowel sounds present, no guarding or rebound. Extremities: No pitting edema, distal pulses 2+. Skin: Warm and dry. Musculoskeletal: No kyphosis. Neuropsychiatric: Alert and oriented x3, affect grossly appropriate.  Prior Cardiac Testing/Procedures 1. Cardiac cath 05/11/2008 CONCLUSION: Normal coronary arteries. Normal left ventricular  function. Normal right heart pressures.  PLAN: The patient has progressive and significant dyspnea. I will  consider pulmonary function testing versus cardiopulmonary stress  testing. She will be enrolled in the stop smoking class.  2.  Echocardiogram Left ventricle: The cavity size was normal. Wall thickness was increased in a pattern of mild LVH. Systolic function was mildly to moderately reduced. The estimated ejection fraction was in the range of 40% to 45%. Diffuse hypokinesis. Doppler parameters are consistent with abnormal left ventricular relaxation (grade 1 diastolic dysfunction). - Ventricular septum: Septal motion showed abnormal function and dyssynergy. - Aortic valve: There was mild regurgitation. - Mitral valve: There was mild regurgitation. - Left atrium: The atrium was at the upper limits of normal in size. - Right atrium: Central venous pressure (est): 3 mm Hg. - Tricuspid valve: There was trivial regurgitation. - Pulmonary arteries: PA peak pressure: 39 mm Hg (S). - Pericardium, extracardiac: There was no pericardial effusion.   Lab Results  Basic Metabolic Panel:  Recent Labs Lab 10/04/13 1348 10/05/13 0713  NA 141 146  K 3.9 4.1  CL 104 110  CO2 21 23  GLUCOSE 108* 85  BUN 20 15  CREATININE 0.99 0.97  CALCIUM 9.0 8.2*    Liver Function Tests:  Recent Labs Lab 10/05/13 0713  AST 17  ALT 11  ALKPHOS 89  BILITOT 0.3  PROT 6.0  ALBUMIN 3.0*    CBC:  Recent Labs Lab 10/04/13 1348 10/05/13 0713  WBC 10.4 4.4  NEUTROABS 8.6*  --   HGB 13.2 12.1  HCT 38.7 36.2  MCV 87.6 88.1  PLT 319 296    Cardiac Enzymes:  Recent Labs Lab 10/04/13 1348 10/04/13 1717 10/04/13 2234 10/05/13 0713  TROPONINI <0.30 <0.30 <0.30 <0.30    Radiology: Dg Chest 2 View  10/04/2013   CLINICAL DATA:  Weakness and dizziness.  EXAM: CHEST  2 VIEW  COMPARISON:  03/05/2013  FINDINGS: Lungs are adequately inflated as a lordotic technique is demonstrated on the frontal exam. There is no consolidation or effusion. There is borderline cardiomegaly. Mild streaky density in the right retrocardiac region not well seen on the lateral film likely vascular crowding. Minimal calcified plaque over the  aortic arch. Remainder the exam is unchanged.  IMPRESSION: No active cardiopulmonary disease.   Electronically Signed   By: Elberta Fortis M.D.   On: 10/04/2013 15:44   Ct Head Wo Contrast  10/04/2013   CLINICAL DATA:  Near syncope  EXAM: CT HEAD WITHOUT CONTRAST  TECHNIQUEIMPRESSION: Mild atrophy and minimal small vessel disease in the periventricular white matter. No intracranial mass, hemorrhage, or acute appearing infarct.   Electronically Signed   By: Bretta Bang M.D.   On: 10/04/2013 15:43   US Carotid Bilateral  10/05/2013   CLINICAL DATA:  Hypertension, syncope,  tobacco use  EXAM: BILATERAL CAROTID DUPLEX ULTRASOUND  TECHNIQUE:  IMPRESSION: Minor carotid atherosclerosis. No hemodynamically significant ICA stenosis by ultrasound. Degree of narrowing less than 50% bilaterally.   Electronically Signed   By: Ruel Favors M.D.   On: 10/05/2013 17:03     ECG: Normal sinus rhythm with bundle branch block rate of 84 beats per minute   Impression and Recommendations  1.Cardiomyopathy: LVEF of 40-45% with diffuse hypokinesis, suspect nonischemic etiology particularly based on prior testing including normal coronary arteries in 2010. She has had prior evaluation with Dr. Antoine Poche, but no recent followup.   She has cardiovascular risk factors to include diabetes, hypertension, hypercholesterolemia, age. She is not complaining of any chest pain, and cardiac enzymes were negative. She did have complaints of some mild palpitations during her episode which she just felt was related to her anxiety concerning her dizziness. Telemetry did not reveal any sinus bradycardia or arrhythmias.  Can consider outpatient stress test for followup ischemic evaluation, secondary to multiple cardiovascular risk factors, although she had a normal catheterization 5 years ago. Heart rate and blood pressure is well-controlled on current medication regimen.  2. Dizziness: Uncertain etiology at this time. She was not  found to be anemic, dehydrated, or bradycardic. Glucose was normal. Doppler carotid ultrasound revealed nonobstructive disease with no hemodynamically significant ICA. She was not found to be orthostatic on review of documentation of vital signs.   3. Hypertension: Currently well controlled. Creatinine 0.99. Potassium 4.1.  4. Diabetes; Continued medical management.  Signed: Bettey Mare. Lawrence NP  10/06/2013, 9:08 AM Co-Sign MD   Attending note:  Patient seen and examined. Reviewed records and modified above note by Ms. Lawrence NP. [name] presents after an episode of dizziness as outlined above, almost sounded vagal in description, vague sense of palpitations, no chest pain or frank syncope. She has had no significant arrhythmias, was not orthostatic, cardiac markers argue against ACS. Echocardiogram shows LVEF 40-45% with diffuse hypokinesis, possibly nonischemic etiology, duration uncertain with last evaluation being in 2010 at which time she had normal coronary arteries. She has a chronic left bundle branch block. I spoke to the hospitalist team and she is to be discharged home today, symptomatically feeling better. Would recommend adding Coreg 3.125 mg daily to her current regimen which includes ARB and aspirin. She will need to have followup with Dr. Antoine Poche - consideration can be given to a followup Cardiolite to reassess potential ischemia in light of active risk factors and prior evaluation being 5 years ago. Might also consider more prolonged monitoring if she continues to manifest episodes without other explanation.  Jonelle Sidle, M.D., F.A.C.C.

## 2013-10-06 NOTE — Progress Notes (Signed)
IV removed. Discharge instructions reviewed with patient. Understanding verbalized. Awaiting ride for discharge home 

## 2013-10-06 NOTE — Progress Notes (Signed)
Patient c/o constipation. Dulcolax Sup given as per prn order.

## 2013-10-11 ENCOUNTER — Other Ambulatory Visit: Payer: Self-pay | Admitting: General Practice

## 2013-10-13 NOTE — Telephone Encounter (Signed)
Not on med list

## 2013-11-05 ENCOUNTER — Other Ambulatory Visit: Payer: Self-pay | Admitting: Nurse Practitioner

## 2013-11-05 ENCOUNTER — Other Ambulatory Visit: Payer: Self-pay | Admitting: General Practice

## 2013-11-08 ENCOUNTER — Other Ambulatory Visit: Payer: Self-pay | Admitting: General Practice

## 2013-11-10 ENCOUNTER — Other Ambulatory Visit: Payer: Self-pay | Admitting: General Practice

## 2013-11-13 ENCOUNTER — Other Ambulatory Visit: Payer: Self-pay | Admitting: Family

## 2013-11-16 ENCOUNTER — Other Ambulatory Visit: Payer: Medicare Other | Admitting: Family

## 2013-11-25 ENCOUNTER — Encounter: Payer: Medicare Other | Admitting: Cardiology

## 2013-11-27 ENCOUNTER — Ambulatory Visit: Payer: Medicare Other

## 2013-12-08 ENCOUNTER — Other Ambulatory Visit: Payer: Medicare Other | Admitting: Family

## 2014-01-02 ENCOUNTER — Other Ambulatory Visit: Payer: Self-pay | Admitting: Family

## 2014-01-04 NOTE — Telephone Encounter (Signed)
Last vit D level was 23.9 in 01/15

## 2014-01-06 ENCOUNTER — Ambulatory Visit (INDEPENDENT_AMBULATORY_CARE_PROVIDER_SITE_OTHER): Payer: Medicare Other | Admitting: Family

## 2014-01-06 ENCOUNTER — Encounter: Payer: Self-pay | Admitting: Family

## 2014-01-06 VITALS — BP 135/72 | HR 58 | Temp 98.5°F | Ht 62.5 in | Wt 152.6 lb

## 2014-01-06 DIAGNOSIS — F329 Major depressive disorder, single episode, unspecified: Secondary | ICD-10-CM | POA: Insufficient documentation

## 2014-01-06 DIAGNOSIS — K219 Gastro-esophageal reflux disease without esophagitis: Secondary | ICD-10-CM | POA: Insufficient documentation

## 2014-01-06 DIAGNOSIS — F32A Depression, unspecified: Secondary | ICD-10-CM

## 2014-01-06 DIAGNOSIS — E559 Vitamin D deficiency, unspecified: Secondary | ICD-10-CM | POA: Insufficient documentation

## 2014-01-06 DIAGNOSIS — Z0000 Encounter for general adult medical examination without abnormal findings: Secondary | ICD-10-CM

## 2014-01-06 DIAGNOSIS — I10 Essential (primary) hypertension: Secondary | ICD-10-CM

## 2014-01-06 DIAGNOSIS — E785 Hyperlipidemia, unspecified: Secondary | ICD-10-CM

## 2014-01-06 DIAGNOSIS — N3281 Overactive bladder: Secondary | ICD-10-CM

## 2014-01-06 DIAGNOSIS — Z01419 Encounter for gynecological examination (general) (routine) without abnormal findings: Secondary | ICD-10-CM

## 2014-01-06 DIAGNOSIS — F411 Generalized anxiety disorder: Secondary | ICD-10-CM

## 2014-01-06 DIAGNOSIS — Z124 Encounter for screening for malignant neoplasm of cervix: Secondary | ICD-10-CM

## 2014-01-06 DIAGNOSIS — F339 Major depressive disorder, recurrent, unspecified: Secondary | ICD-10-CM | POA: Insufficient documentation

## 2014-01-06 DIAGNOSIS — Z13820 Encounter for screening for osteoporosis: Secondary | ICD-10-CM

## 2014-01-06 MED ORDER — ALPRAZOLAM 0.5 MG PO TABS: 0.5000 mg | ORAL_TABLET | Freq: Every day | ORAL | Status: DC | PRN

## 2014-01-06 MED ORDER — IBUPROFEN 800 MG PO TABS: ORAL_TABLET | ORAL | Status: DC

## 2014-01-06 NOTE — Progress Notes (Signed)
Subjective:    Patient ID: [name], female    DOB: 09/30/1934, 79 y.o.   MRN: [id number]  Pt presents to the office for annual with pelvic exam. Pt states she had a hysterectomy 30-40 years ago.  Hypertension This is a chronic problem. The current episode started more than 1 year ago. The problem has been resolved since onset. The problem is controlled. Associated symptoms include anxiety. Pertinent negatives include no headaches, palpitations, peripheral edema or shortness of breath. Risk factors for coronary artery disease include dyslipidemia, obesity and post-menopausal state. Past treatments include beta blockers and angiotensin blockers. The current treatment provides significant improvement. There is no history of kidney disease, CAD/MI, heart failure or a thyroid problem. There is no history of sleep apnea.  Hyperlipidemia This is a chronic problem. The current episode started more than 1 year ago. The problem is controlled. Recent lipid tests were reviewed and are normal. She has no history of diabetes or hypothyroidism. Pertinent negatives include no leg pain, myalgias or shortness of breath. Current antihyperlipidemic treatment includes statins. Risk factors for coronary artery disease include dyslipidemia, hypertension and post-menopausal.  Gastrophageal Reflux She reports no belching, no choking, no heartburn, no sore throat or no stridor. This is a chronic problem. The current episode started more than 1 year ago. The problem occurs rarely. The symptoms are aggravated by certain foods and lying down. She has tried a PPI for the symptoms. The treatment provided significant relief.  Anxiety Presents for follow-up visit. Symptoms include nervous/anxious behavior. Patient reports no depressed mood, excessive worry, insomnia, irritability, palpitations or shortness of breath. Symptoms occur rarely.   Her past medical history is significant for anxiety/panic attacks. Past treatments  include SSRIs and benzodiazephines. The treatment provided moderate relief. Compliance with prior treatments has been good.      Review of Systems  Constitutional: Negative.  Negative for irritability.  HENT: Negative.  Negative for sore throat.   Eyes: Negative.   Respiratory: Negative.  Negative for choking and shortness of breath.   Cardiovascular: Negative.  Negative for palpitations.  Gastrointestinal: Negative.  Negative for heartburn.  Endocrine: Negative.   Genitourinary: Negative.   Musculoskeletal: Negative.  Negative for myalgias.  Neurological: Negative.  Negative for headaches.  Hematological: Negative.   Psychiatric/Behavioral: The patient is nervous/anxious. The patient does not have insomnia.   All other systems reviewed and are negative.      Objective:   Physical Exam  Vitals reviewed. Constitutional: She is oriented to person, place, and time. She appears well-developed and well-nourished. No distress.  HENT:  Head: Normocephalic and atraumatic.  Right Ear: External ear normal.  Mouth/Throat: Oropharynx is clear and moist.  Eyes: Pupils are equal, round, and reactive to light.  Neck: Normal range of motion. Neck supple. No thyromegaly present.  Cardiovascular: Normal rate, regular rhythm, normal heart sounds and intact distal pulses.   No murmur heard. Pulmonary/Chest: Effort normal and breath sounds normal. No respiratory distress. She has no wheezes. Right breast exhibits no inverted nipple, no mass, no nipple discharge, no skin change and no tenderness. Left breast exhibits no inverted nipple, no mass, no nipple discharge, no skin change and no tenderness. Breasts are symmetrical.  Abdominal: Soft. Bowel sounds are normal. She exhibits no distension. There is no tenderness.  Genitourinary: Vagina normal.  Bimanual exam- no adnexal masses or tenderness, ovaries nonpalpable   Pt has had hysterectomy in past-Cervix not noted   Musculoskeletal: Normal range  of motion. She exhibits no  edema and no tenderness.  Neurological: She is alert and oriented to person, place, and time. She has normal reflexes. No cranial nerve deficit.  Skin: Skin is warm and dry.  Psychiatric: She has a normal mood and affect. Her behavior is normal. Judgment and thought content normal.     BP 135/72  Pulse 58  Temp(Src) 98.5 F (36.9 C) (Oral)  Ht 5' 2.5" (1.588 m)  Wt 152 lb 9.6 oz (69.219 kg)  BMI 27.45 kg/m2      Assessment & Plan:  1. Generalized anxiety disorder - ALPRAZolam (XANAX) 0.5 MG tablet; Take 1 tablet (0.5 mg total) by mouth daily as needed for anxiety.  Dispense: 30 tablet; Refill: 1 - CMP14+EGFR  2. HYPERTENSION - CMP14+EGFR  3. GAD (generalized anxiety disorder)  4. Depression - CMP14+EGFR  5. Gastroesophageal reflux disease without esophagitis - CMP14+EGFR  6. Unspecified vitamin D deficiency - CMP14+EGFR - Vit D  25 hydroxy (rtn osteoporosis monitoring)  7. Overactive bladder - CMP14+EGFR  8. Osteoporosis screening - CMP14+EGFR - DG Bone Density; Future  9. Hyperlipidemia - Lipid panel  10. Visit for pelvic exam - Pap IG (Image Guided)   Continue all meds Labs pending Health Maintenance reviewed- Pt to schedule mammogram Diet and exercise encouraged RTO 6 months  Evelina Dun, FNP

## 2014-01-06 NOTE — Patient Instructions (Signed)

## 2014-01-09 LAB — PAP IG (IMAGE GUIDED): PAP SMEAR COMMENT: 0

## 2014-01-27 ENCOUNTER — Other Ambulatory Visit: Payer: Self-pay | Admitting: General Practice

## 2014-01-28 ENCOUNTER — Other Ambulatory Visit: Payer: Self-pay | Admitting: *Deleted

## 2014-01-28 NOTE — Telephone Encounter (Signed)
Please advise if patient should still be taking this medication. Not on current med list

## 2014-02-10 ENCOUNTER — Ambulatory Visit (INDEPENDENT_AMBULATORY_CARE_PROVIDER_SITE_OTHER): Payer: Medicare Other | Admitting: Cardiology

## 2014-02-10 ENCOUNTER — Encounter: Payer: Self-pay | Admitting: Cardiology

## 2014-02-10 VITALS — BP 130/72 | HR 79 | Ht 61.5 in | Wt 152.0 lb

## 2014-02-10 DIAGNOSIS — I447 Left bundle-branch block, unspecified: Secondary | ICD-10-CM

## 2014-02-10 MED ORDER — CARVEDILOL 6.25 MG PO TABS: 6.2500 mg | ORAL_TABLET | Freq: Two times a day (BID) | ORAL | Status: DC

## 2014-02-10 NOTE — Progress Notes (Signed)
HPI The patient was recently hospitalized secondary to loss of consciousness. She had an extensive workup with no clear etiology. CT scan of the head demonstrated no acute abnormalities. Carotid Dopplers demonstrated only mild disease. There were no dysrhythmias noted. She does have a mildly reduced ejection fraction which has been noted previously. However, she's had normal coronary arteries to evaluate this in 2010. She was seen in consultation by our service. It was recommended that she should have another outpatient stress test given her risk factors. She returns for followup with me.  She reports no further syncopal episodes. She does activity such as going to the grocery store. With this level of activity she has not had any cardiovascular complaints. The patient denies any new symptoms such as chest discomfort, neck or arm discomfort. There has been no new shortness of breath, PND or orthopnea. There have been no reported palpitations, presyncope or syncope.   Allergies  Allergen Reactions  . Codeine Itching and Swelling    eye irritation (prescribed percocet)  . Lisinopril-Hydrochlorothiazide     Lip swelling. Stopped by ENT. Patient is on Losartan without any issues   . Penicillins Itching and Swelling    eye irritation    Current Outpatient Prescriptions  Medication Sig Dispense Refill  . ALPRAZolam (XANAX) 0.5 MG tablet Take 1 tablet (0.5 mg total) by mouth daily as needed for anxiety.  30 tablet  1  . amitriptyline (ELAVIL) 25 MG tablet Take 1 tablet (25 mg total) by mouth at bedtime.  90 tablet  1  . aspirin EC 81 MG tablet Take 81 mg by mouth daily.      . budesonide-formoterol (SYMBICORT) 160-4.5 MCG/ACT inhaler Inhale 1 puff into the lungs 2 (two) times daily.  1 Inhaler  3  . buPROPion (WELLBUTRIN SR) 150 MG 12 hr tablet Take 1 tablet (150 mg total) by mouth 2 (two) times daily.  180 tablet  1  . cetirizine (ZYRTEC) 10 MG tablet TAKE 1 TABLET (10 MG TOTAL) BY MOUTH  DAILY.  30 tablet  2  . esomeprazole (NEXIUM) 40 MG capsule Take 1 capsule (40 mg total) by mouth daily before breakfast.  90 capsule  3  . fesoterodine (TOVIAZ) 8 MG TB24 tablet Take 8 mg by mouth daily.      . furosemide (LASIX) 20 MG tablet TAKE ONE HALF TABLET BY MOUTH EVERY DAY  30 tablet  2  . ibuprofen (ADVIL,MOTRIN) 800 MG tablet TAKE ONE TABLET BY MOUTH EVERY SIX HOURS AS NEEDED  30 tablet  1  . losartan (COZAAR) 50 MG tablet Take 1 tablet (50 mg total) by mouth daily.  90 tablet  1  . mirabegron ER (MYRBETRIQ) 50 MG TB24 tablet Take 50 mg by mouth daily.      . raloxifene (EVISTA) 60 MG tablet Take 1 tablet (60 mg total) by mouth daily.  90 tablet  1  . ranitidine (ZANTAC) 150 MG capsule Take 150 mg by mouth every morning.      . simvastatin (ZOCOR) 10 MG tablet Take 1 tablet (10 mg total) by mouth at bedtime.  90 tablet  1  . traMADol (ULTRAM) 50 MG tablet Take 50 mg by mouth every 12 (twelve) hours as needed.      . vitamin B-12 (CYANOCOBALAMIN) 1000 MCG tablet Take 1,000 mcg by mouth daily.      . Vitamin D, Ergocalciferol, (DRISDOL) 50000 UNITS CAPS capsule TAKE ONE CAPSULE BY MOUTH WEEKLY  12 capsule  6  .  carvedilol (COREG) 3.125 MG tablet Take 1 tablet (3.125 mg total) by mouth 2 (two) times daily with a meal.  60 tablet  1   No current facility-administered medications for this visit.    Past Medical History  Diagnosis Date  . Essential hypertension, benign   . Mixed hyperlipidemia   . Depression   . Allergic rhinitis   . GERD (gastroesophageal reflux disease)   . Pelvic pain   . Arthritis   . History of blood transfusion   . Left bundle branch block   . History of cardiac catheterization     Normal coronaries 2010  . Secondary cardiomyopathy     LVEF 40-45%, likely nonischemic    Past Surgical History  Procedure Laterality Date  . Cholecystectomy  1991  . Abdominal hysterectomy      Many years ago  . Tonsillectomy    . Appendectomy    . Back surgery   07/21/2008  . Rotator cuff repair  2011    right  . Cataract extraction w/phaco  04/16/2011    Procedure: CATARACT EXTRACTION PHACO AND INTRAOCULAR LENS PLACEMENT (IOC);  Surgeon: Susa Simmonds;  Location: [facility];  Service: Ophthalmology;  Laterality: Left;  CDE=11.35  . Hammer toe surgery  11/22/11    [facility], [name]  . Cataract extraction w/phaco  01/28/2012    Procedure: CATARACT EXTRACTION PHACO AND INTRAOCULAR LENS PLACEMENT (IOC);  Surgeon: Susa Simmonds, MD;  Location: [facility];  Service: Ophthalmology;  Laterality: Right;  CDI:8.15  . Cysto with hydrodistension  05/29/2012    Procedure: CYSTOSCOPY/HYDRODISTENSION;  Surgeon: Martina Sinner, MD;  Location: [facility];  Service: Urology;  Laterality: N/A;  INSTILLATION OF MARCAINE AND PYRIDIUM   . Eye surgery    . Lumbar laminectomy/decompression microdiscectomy Left 06/06/2012    Procedure: LUMBAR LAMINECTOMY/DECOMPRESSION MICRODISCECTOMY 1 LEVEL;  Surgeon: Maeola Harman, MD;  Location: [facility];  Service: Neurosurgery;  Laterality: Left;  Left Lumbar two-three Laminectomy for resection of synovial cyst  . Total hip arthroplasty Right 03/13/2013    Procedure: TOTAL HIP ARTHROPLASTY;  Surgeon: Nestor Lewandowsky, MD;  Location: [facility];  Service: Orthopedics;  Laterality: Right;    ROS:  As stated in the HPI and negative for all other systems.  PHYSICAL EXAM BP 130/72  Pulse 79  Ht 5' 1.5" (1.562 m)  Wt 152 lb (68.947 kg)  BMI 28.26 kg/m2 GENERAL:  Well appearing HEENT:  Pupils equal round and reactive, fundi not visualized, oral mucosa unremarkable NECK:  No jugular venous distention, waveform within normal limits, carotid upstroke brisk and symmetric, no bruits, no thyromegaly LYMPHATICS:  No cervical, inguinal adenopathy LUNGS:  Clear to auscultation bilaterally BACK:  No CVA tenderness CHEST:  Unremarkable HEART:  PMI not displaced or sustained,S1 and S2 within normal limits, no S3, no S4, no clicks, no rubs, no  murmurs ABD:  Flat, positive bowel sounds normal in frequency in pitch, no bruits, no rebound, no guarding, no midline pulsatile mass, no hepatomegaly, no splenomegaly EXT:  2 plus pulses throughout, no edema, no cyanosis no clubbing SKIN:  No rashes no nodules NEURO:  Cranial nerves II through XII grossly intact, motor grossly intact throughout PSYCH:  Cognitively intact, oriented to person place and time  EKG:  Sinus rhythm, left bundle-branch block, probable right atrial enlargement, premature atrial contractions, no change from previous. 02/10/2014  ASSESSMENT AND PLAN  CARDIOMYOPATHY:   At this point her ejection fraction is similar to previous. She had normal coronaries  before. She has not had any symptoms of obstructive coronary disease. Her enzymes were negative previously. I do not think further cardiovascular testing is suggested. I did review all of her hospital records. I will increase her carvedilol slightly.  HTN:  This is being managed in the context of treating his CHF  CAROTID DOPPLER:   She can have followup of this mild stenosis in 2 years.  TOBACCO:  I did discus with her the need to stop smoking.

## 2014-02-10 NOTE — Patient Instructions (Signed)
Please increase your Carvedilol to 6.25 mg twice a day. Continue all other medications as listed.  Follow up in 6 months with Dr. Antoine Poche in [place].  You will receive a letter in the mail 2 months before you are due.  Please call us when you receive this letter to schedule your follow up appointment.

## 2014-02-13 ENCOUNTER — Other Ambulatory Visit: Payer: Self-pay | Admitting: General Practice

## 2014-02-15 ENCOUNTER — Ambulatory Visit (INDEPENDENT_AMBULATORY_CARE_PROVIDER_SITE_OTHER): Payer: Medicare Other | Admitting: Pharmacist

## 2014-02-15 ENCOUNTER — Encounter: Payer: Self-pay | Admitting: Pharmacist

## 2014-02-15 VITALS — BP 128/70 | HR 66 | Ht 62.0 in | Wt 149.0 lb

## 2014-02-15 DIAGNOSIS — Z9114 Patient's other noncompliance with medication regimen: Secondary | ICD-10-CM

## 2014-02-15 DIAGNOSIS — G4700 Insomnia, unspecified: Secondary | ICD-10-CM

## 2014-02-15 DIAGNOSIS — Z0000 Encounter for general adult medical examination without abnormal findings: Secondary | ICD-10-CM

## 2014-02-15 DIAGNOSIS — Z13820 Encounter for screening for osteoporosis: Secondary | ICD-10-CM

## 2014-02-15 MED ORDER — RANITIDINE HCL 150 MG PO CAPS: 150.0000 mg | ORAL_CAPSULE | Freq: Every evening | ORAL | Status: DC

## 2014-02-15 NOTE — Progress Notes (Signed)
Subjective:    [name] is a 79 y.o. female who presents for Medicare Initial Wellness Visit and discuss medication compliance per [facility].   Patient brings in her medications today.  Reviewed patient's medication in depth - how to take, what they are for and when is best time to take them  Preventive Screening-Counseling & Management  Tobacco History  Smoking status  . Current Some Day Smoker -- 0.10 packs/day for 40 years  . Types: Cigarettes  Smokeless tobacco  . Never Used    Comment: smokes 2 cigarettes a day     Current Problems (verified) Patient Active Problem List   Diagnosis Date Noted  . GAD (generalized anxiety disorder) 01/06/2014  . Depression 01/06/2014  . GERD (gastroesophageal reflux disease) 01/06/2014  . Unspecified vitamin D deficiency 01/06/2014  . Overactive bladder 01/06/2014  . Secondary cardiomyopathy 10/06/2013  . Left bundle branch block 10/06/2013  . Dizziness 10/04/2013  . Osteoarthritis of right hip 03/14/2013  . ALLERGIC RHINITIS 12/07/2008  . Chronic cough 12/07/2008  . HEPATITIS B 08/16/2008  . Hyperlipidemia 08/16/2008  . HYPERTENSION 08/16/2008  . OSTEOARTHRITIS 08/16/2008  . DISC DEGENERATION 07/24/2007  . CERVICAL SPASM 07/24/2007    Medications Prior to Visit Current Outpatient Prescriptions on File Prior to Visit  Medication Sig Dispense Refill  . ALPRAZolam (XANAX) 0.5 MG tablet Take 1 tablet (0.5 mg total) by mouth daily as needed for anxiety.  30 tablet  1  . amitriptyline (ELAVIL) 25 MG tablet Take 1 tablet (25 mg total) by mouth at bedtime.  90 tablet  1  . aspirin EC 81 MG tablet Take 81 mg by mouth daily.      . budesonide-formoterol (SYMBICORT) 160-4.5 MCG/ACT inhaler Inhale 1 puff into the lungs 2 (two) times daily.  1 Inhaler  3  . buPROPion (WELLBUTRIN SR) 150 MG 12 hr tablet Take 1 tablet (150 mg total) by mouth 2 (two) times daily.  180 tablet  1  . carvedilol (COREG) 6.25 MG tablet  **patient currently has  carvediolol 3.125mg  tablets and is still taking 1 tablet bid Take 1 tablet (6.25 mg total) by mouth 2 (two) times daily with a meal.      60 tablet  6  . cetirizine (ZYRTEC) 10 MG tablet TAKE 1 TABLET (10 MG TOTAL) BY MOUTH DAILY.  30 tablet  2  . esomeprazole (NEXIUM) 40 MG capsule Take 1 capsule (40 mg total) by mouth daily before breakfast.  90 capsule  3  . fesoterodine (TOVIAZ) 8 MG TB24 tablet - not taking Take 8 mg by mouth daily.      . furosemide (LASIX) 20 MG tablet TAKE ONE HALF TABLET BY MOUTH EVERY DAY  30 tablet  2  . ibuprofen (ADVIL,MOTRIN) 800 MG tablet TAKE ONE TABLET BY MOUTH EVERY SIX HOURS AS NEEDED  30 tablet  1  . losartan (COZAAR) 50 MG tablet Take 1 tablet (50 mg total) by mouth daily.  90 tablet  1  . mirabegron ER (MYRBETRIQ) 50 MG TB24 tablet Take 50 mg by mouth daily.      . raloxifene (EVISTA) 60 MG tablet Take 1 tablet (60 mg total) by mouth daily.  90 tablet  1  . ranitidine (ZANTAC) 150 MG capsule  **patient did not have this with medications she brought in with her today Take 150 mg by mouth every morning.      . simvastatin (ZOCOR) 10 MG tablet Take 1 tablet (10 mg total) by mouth at  bedtime.  90 tablet  1  . traMADol (ULTRAM) 50 MG tablet Take 50 mg by mouth every 12 (twelve) hours as needed.      . vitamin B-12 (CYANOCOBALAMIN) 1000 MCG tablet Take 1,000 mcg by mouth daily.      . Vitamin D, Ergocalciferol, (DRISDOL) 50000 UNITS CAPS capsule TAKE ONE CAPSULE BY MOUTH WEEKLY  12 capsule  6   No current facility-administered medications on file prior to visit.    Current Medications (verified) Current Outpatient Prescriptions  Medication Sig Dispense Refill  . ALPRAZolam (XANAX) 0.5 MG tablet Take 1 tablet (0.5 mg total) by mouth daily as needed for anxiety.  30 tablet  1  . amitriptyline (ELAVIL) 25 MG tablet Take 0.5 tablets (12.5 mg total) by mouth at bedtime.  90 tablet  1  . aspirin EC 81 MG tablet Take 81 mg by mouth daily.      . Biotin 5000 MCG  CAPS Take 1 capsule by mouth daily.      . budesonide-formoterol (SYMBICORT) 160-4.5 MCG/ACT inhaler Inhale 1 puff into the lungs 2 (two) times daily.  1 Inhaler  3  . buPROPion (WELLBUTRIN SR) 150 MG 12 hr tablet Take 1 tablet (150 mg total) by mouth 2 (two) times daily.  180 tablet  1  . calcium citrate-vitamin D (CITRACAL+D) 315-200 MG-UNIT per tablet Take 1 tablet by mouth daily.      . carvedilol (COREG) 6.25 MG tablet Take 1 tablet (6.25 mg total) by mouth 2 (two) times daily with a meal.  60 tablet  6  . esomeprazole (NEXIUM) 40 MG capsule Take 1 capsule (40 mg total) by mouth daily before breakfast.  90 capsule  3  . ferrous sulfate 324 (65 FE) MG TBEC Take 1 tablet by mouth daily.      . furosemide (LASIX) 20 MG tablet TAKE ONE HALF TABLET BY MOUTH EVERY DAY  30 tablet  2  . ibuprofen (ADVIL,MOTRIN) 800 MG tablet TAKE ONE TABLET BY MOUTH EVERY SIX HOURS AS NEEDED  30 tablet  1  . losartan (COZAAR) 50 MG tablet Take 1 tablet (50 mg total) by mouth daily.  90 tablet  1  . mirabegron ER (MYRBETRIQ) 50 MG TB24 tablet Take 50 mg by mouth daily.      . raloxifene (EVISTA) 60 MG tablet Take 1 tablet (60 mg total) by mouth daily.  90 tablet  1  . simvastatin (ZOCOR) 10 MG tablet Take 1 tablet (10 mg total) by mouth at bedtime.  90 tablet  1  . vitamin B-12 (CYANOCOBALAMIN) 1000 MCG tablet Take 500 mcg by mouth daily.       . Vitamin D, Ergocalciferol, (DRISDOL) 50000 UNITS CAPS capsule TAKE ONE CAPSULE BY MOUTH WEEKLY  12 capsule  6  . cetirizine (ZYRTEC) 10 MG tablet TAKE 1 TABLET (10 MG TOTAL) BY MOUTH DAILY.  30 tablet  2  . ranitidine (ZANTAC) 150 MG capsule Take 1 capsule (150 mg total) by mouth every evening.  90 capsule  1   No current facility-administered medications for this visit.     Allergies (verified) Codeine; Lisinopril-hydrochlorothiazide; and Penicillins   PAST HISTORY  Family History Family History  Problem Relation Age of Onset  . Colon cancer Neg Hx   . Anesthesia  problems Neg Hx   . Hypotension Neg Hx   . Malignant hyperthermia Neg Hx   . Pseudochol deficiency Neg Hx   . Diabetes Other   . Allergies Other   .  Diabetes Father   . Diabetes Brother   . Cancer Brother 63    colon  . Diabetes Brother   . Diabetes Brother     Social History History  Substance Use Topics  . Smoking status: Current Some Day Smoker -- 0.10 packs/day for 40 years    Types: Cigarettes  . Smokeless tobacco: Never Used     Comment: smokes 2 cigarettes a day  . Alcohol Use: No     Are there smokers in your home (other than you)? Yes - her son  Risk Factors Current exercise habits: Gym/ health club routine -  has signed up at [facility] but has not gone recently.  Dietary issues discussed: none   Cardiac risk factors: advanced age (older than 52 for men, 52 for women), dyslipidemia, family history of premature cardiovascular disease, hypertension, sedentary lifestyle and smoking/ tobacco exposure.  Depression Screen (Note: if answer to either of the following is "Yes", a more complete depression screening is indicated)   Over the past 2 weeks, have you felt down, depressed or hopeless? Yes  Over the past 2 weeks, have you felt little interest or pleasure in doing things? No  Have you lost interest or pleasure in daily life? No  Do you often feel hopeless? No  Do you cry easily over simple problems? No  Activities of Daily Living In your present state of health, do you have any difficulty performing the following activities?:  Driving? No Managing money?  No Feeding yourself? No Getting from bed to chair? No  Climbing a flight of stairs? No Preparing food and eating?: No Bathing or showering? No Getting dressed: No Getting to the toilet? No Using the toilet:No Moving around from place to place: No In the past year have you fallen or had a near fall?:No   Are you sexually active?  No  Do you have more than one partner?  No  Hearing Difficulties: No Do you  often ask people to speak up or repeat themselves? No Do you experience ringing or noises in your ears? No Do you have difficulty understanding soft or whispered voices? No   Do you feel that you have a problem with memory? yes  Do you often misplace items? Yes  Do you feel safe at home?  Yes  Cognitive Testing  Alert? Yes  Normal Appearance?Yes  Oriented to person? Yes  Place? Yes   Time? Yes  Recall of three objects?  Yes  Can perform simple calculations? Yes  Displays appropriate judgment?Yes  Can read the correct time from a watch face?Yes   Advanced Directives have been discussed with the patient? Yes  List the Names of Other Physician/Practitioners you currently use: 1.  Cariologist - Hocherin 2.  Eye - Haines 3.  Urologist - MacDiarmid 4.  Pulmonologist - Ramaswamy 5.  GI - Christella Hartigan 6.  Neurology - Amado Nash any recent Medical Services you may have received from other than [facility] providers in the past year (date may be approximate).  Immunization History  Administered Date(s) Administered  . Influenza Split 01/29/2011, 01/29/2012, 01/26/2013  . Influenza,inj,Quad PF,36+ Mos 12/07/2013  . Pneumococcal Polysaccharide-23 01/28/2010    Screening Tests Health Maintenance  Topic Date Due  . Influenza Vaccine  11/29/2014  . Tetanus/tdap  11/28/2016  . Colonoscopy  04/30/2017  . Pneumococcal Polysaccharide Vaccine Age 44 And Over  Completed  . Zostavax  Completed    All answers were reviewed with the patient and necessary referrals were made:  Henrene Pastor, PHARMD   02/15/2014   History reviewed: allergies, current medications, past family history, past medical history, past social history, past surgical history and problem list     Objective:   Body mass index is 27.25 kg/(m^2). BP 128/70  Pulse 66  Ht  (1.575 m)  Wt 149 lb (67.586 kg)  BMI 27.25 kg/m2   Assessment:     Initial Medicare Wellness Visit Medication Compliance Assessment      Plan:     During the course of the visit the patient was educated and counseled about appropriate screening and preventive services including:    Pneumococcal vaccine - looking into where patient received pneumo vaccine which is documented for 10/06/13  Influenza vaccine - UTD  Td vaccine - UTD  Screening mammography - UTD  Screening Pap smear and pelvic exam - UTD  Bone densitometry screening - Referral made today  Colorectal cancer screening - UTD  Diabetes screening - continue to monitor FBG / PPBG  Glaucoma screening - reminded patient to make eye appt with Dr Lita Mains  Smoking cessation counseling - patient is trying to quit.  Has cut down to 1-2 cigs per day.  Commended her on current efforts and reminded about benefits of smoking cessation on heart and lungs. Reviewed strategies  Advanced directives: No advanced directives - information packet has been given / [facility]  Spent most of visit reviewing each medications, how to take properly, when is bet time to take and why she is taking each medication  Rx sent in for ranitidine  1 qpm  Explained to take about increase in carvediolol dose from 02/10/2014 by Dr Kirtland Bouchard - she will take 2 tablets BID of carvediolol 3.125mg  until she has completed what she currently has on hand.  She will then switch to 6.25mg  1 tablet bid.    Recommended decrease amitriptyline  to 1/2 tablet ast bedtime for 2 weeks, then try to discontinue all together.  Suggested patient ask her regular pharmacist if they ever package mediations for patients.  If they do not and patient would like this service she can call me with pharmacy recommendations.  Patient Instructions (the written plan) was given to the patient.  Medicare Attestation I have personally reviewed: The patient's medical and social history Their use of alcohol, tobacco or illicit drugs Their current medications and supplements The patient's functional ability  including ADLs,fall risks, home safety risks, cognitive, and hearing and visual impairment Diet and physical activities Evidence for depression or mood disorders  The patient's weight, height, BMI, and visual acuity have been recorded in the chart.  I have made referrals, counseling, and provided education to the patient based on review of the above and I have provided the patient with a written personalized care plan for preventive services.     Henrene Pastor, [facility]   02/15/2014

## 2014-02-15 NOTE — Telephone Encounter (Signed)
Last seen 01/06/14 [name]  If approved route to nurse to call into [facility]

## 2014-02-15 NOTE — Patient Instructions (Signed)
Health Maintenance Summary   Bone Density / Osteoporosis Test Due Now Referral made today    Eye Exam Due Now  Make appointment with Dr Lita Mains   Pneumonia vaccine  Up to date  Done 09/2013   Mammogram Up to date  Done 01/27/2014   Pap Smear Up to date  Done 12/2013    INFLUENZA VACCINE Next Due 11/29/2014  done 01/2014    TETANUS/TDAP Next Due 11/28/2016  done 11/2006    COLONOSCOPY Next Due 04/30/2017  done 05/2007

## 2014-02-19 NOTE — Telephone Encounter (Signed)
Refill called to [facility] 

## 2014-02-19 NOTE — Telephone Encounter (Signed)
Please call in xanax with 1 refills 

## 2014-03-02 ENCOUNTER — Other Ambulatory Visit: Payer: Self-pay | Admitting: Family

## 2014-03-04 ENCOUNTER — Telehealth: Payer: Self-pay | Admitting: Family

## 2014-03-04 MED ORDER — IBUPROFEN 800 MG PO TABS: ORAL_TABLET | ORAL | Status: DC

## 2014-03-04 NOTE — Telephone Encounter (Signed)
Prescription sent electronically per refill protocol

## 2014-03-20 ENCOUNTER — Ambulatory Visit (INDEPENDENT_AMBULATORY_CARE_PROVIDER_SITE_OTHER): Payer: Medicare Other | Admitting: Nurse Practitioner

## 2014-03-20 VITALS — BP 169/83 | HR 80 | Temp 96.8°F | Ht 62.0 in | Wt 152.0 lb

## 2014-03-20 DIAGNOSIS — J069 Acute upper respiratory infection, unspecified: Secondary | ICD-10-CM

## 2014-03-20 MED ORDER — BENZONATATE 100 MG PO CAPS: 100.0000 mg | ORAL_CAPSULE | Freq: Two times a day (BID) | ORAL | Status: DC | PRN

## 2014-03-20 MED ORDER — AZITHROMYCIN 250 MG PO TABS: ORAL_TABLET | ORAL | Status: DC

## 2014-03-20 NOTE — Progress Notes (Signed)
   Subjective:    Patient ID: [name], female    DOB: [birth year], 79 y.o.   MRN: [id number]  HPI Patient in c/o cough and congestion- started Thursday and has gotten worse. Has used robitussin cough meds.    Review of Systems  Constitutional: Negative for fever, chills and fatigue.  HENT: Positive for congestion and rhinorrhea. Negative for sore throat, trouble swallowing and voice change.   Respiratory: Positive for cough.   Cardiovascular: Negative.   Gastrointestinal: Negative.   Neurological: Positive for headaches (slight).  Psychiatric/Behavioral: Negative.        Objective:   Physical Exam  Constitutional: She is oriented to person, place, and time. She appears well-developed and well-nourished.  HENT:  Right Ear: Hearing, tympanic membrane, external ear and ear canal normal.  Left Ear: Hearing, tympanic membrane, external ear and ear canal normal.  Mouth/Throat: Uvula is midline, oropharynx is clear and moist and mucous membranes are normal.  Eyes: Pupils are equal, round, and reactive to light.  Neck: Normal range of motion. Neck supple.  Cardiovascular: Normal rate and regular rhythm.   Pulmonary/Chest: Effort normal and breath sounds normal. No respiratory distress. She has no wheezes. She has no rales.  Deep dry cough  Lymphadenopathy:    She has no cervical adenopathy.  Neurological: She is alert and oriented to person, place, and time.  Skin: Skin is warm and dry.  Psychiatric: She has a normal mood and affect. Her behavior is normal. Judgment and thought content normal.   BP 169/83 mmHg  Pulse 80  Temp(Src) 96.8 F (36 C) (Oral)  Ht 5\' 2"  (1.575 m)  Wt 152 lb (68.947 kg)  BMI 27.79 kg/m2        Assessment & Plan:   1. Upper respiratory infection with cough and congestion    Meds ordered this encounter  Medications  . carvedilol (COREG) 3.125 MG tablet    Sig: Take 1 tablet by mouth daily.  azithromycin (ZITHROMAX Z-PAK) 250 MG tablet      Sig: As directed    Dispense:  6 each    Refill:  0    Order Specific Question:  Supervising Provider    Answer:  Marland Kitchen [[id number]]  . benzonatate (TESSALON) 100 MG capsule    Sig: Take 1 capsule (100 mg total) by mouth 2 (two) times daily as needed for cough.    Dispense:  20 capsule    Refill:  0    Order Specific Question:  Supervising Provider    Answer:  Ernestina Penna [[id number]]   1. Take meds as prescribed 2. Use a cool mist humidifier especially during the winter months and when heat has been humid. 3. Use saline nose sprays frequently 4. Saline irrigations of the nose can be very helpful if done frequently.  * 4X daily for 1 week*  * Use of a nettie pot can be helpful with this. Follow directions with this* 5. Drink plenty of fluids 6. Keep thermostat turn down low 7.For any cough or congestion  Use plain Mucinex- regular strength or max strength is fine- NO DECONGESTANTS!   * Children- consult with Pharmacist for dosing 8. For fever or aces or pains- take tylenol or ibuprofen appropriate for age and weight.  * for fevers greater than 101 orally you may alternate ibuprofen and tylenol every  3 hours.   Mary-Margaret 07-16-1983, FNP

## 2014-03-20 NOTE — Patient Instructions (Signed)

## 2014-03-29 ENCOUNTER — Telehealth: Payer: Self-pay | Admitting: Nurse Practitioner

## 2014-04-05 ENCOUNTER — Other Ambulatory Visit: Payer: Self-pay | Admitting: *Deleted

## 2014-04-05 DIAGNOSIS — G4700 Insomnia, unspecified: Secondary | ICD-10-CM

## 2014-04-05 DIAGNOSIS — K219 Gastro-esophageal reflux disease without esophagitis: Secondary | ICD-10-CM

## 2014-04-05 MED ORDER — ESOMEPRAZOLE MAGNESIUM 40 MG PO CPDR: 40.0000 mg | DELAYED_RELEASE_CAPSULE | Freq: Every day | ORAL | Status: DC

## 2014-04-05 MED ORDER — CARVEDILOL 3.125 MG PO TABS: 3.1250 mg | ORAL_TABLET | Freq: Every day | ORAL | Status: DC

## 2014-04-05 MED ORDER — AMITRIPTYLINE HCL 25 MG PO TABS: 12.5000 mg | ORAL_TABLET | Freq: Every day | ORAL | Status: DC

## 2014-04-05 MED ORDER — SIMVASTATIN 10 MG PO TABS: 10.0000 mg | ORAL_TABLET | Freq: Every day | ORAL | Status: DC

## 2014-04-05 MED ORDER — FUROSEMIDE 20 MG PO TABS: 10.0000 mg | ORAL_TABLET | Freq: Every day | ORAL | Status: DC

## 2014-04-05 MED ORDER — LOSARTAN POTASSIUM 50 MG PO TABS
50.0000 mg | ORAL_TABLET | Freq: Every day | ORAL | Status: DC
Start: 2014-04-05 — End: 2014-11-05

## 2014-04-05 NOTE — Telephone Encounter (Signed)
Pt was seen by Jannifer Rodney 12/2013 for her regular follow up for management of her Chronic medical problems, per Neysa Bonito pt to return in 6 months. Pt due in March for her follow up but needs 90 day supply on her medications. Rx printed and signed by Ander Slade since Jemison still on Maternity Leave. Pt aware to come to office and pick up rx.

## 2014-04-07 ENCOUNTER — Other Ambulatory Visit: Payer: Self-pay | Admitting: Family

## 2014-04-12 ENCOUNTER — Telehealth: Payer: Self-pay | Admitting: Nurse Practitioner

## 2014-04-12 NOTE — Telephone Encounter (Signed)
Stp and she stated her BP was 150/86, she has been feeling a little dizzy this morning, no other symptoms noted. Pt has only had a cup of coffee and <16 oz of water all day. Advised pt she needs to push PO fluids to help with the dizziness, her daughter is with her currently and will stay with her. Advised to CB this afternoon if the dizziness doesn't subside after increasing fluids and if the BP stays elevated. Pt voiced understanding, will close call.

## 2014-05-07 ENCOUNTER — Other Ambulatory Visit: Payer: Self-pay | Admitting: General Practice

## 2014-05-19 ENCOUNTER — Ambulatory Visit (INDEPENDENT_AMBULATORY_CARE_PROVIDER_SITE_OTHER): Payer: Medicare Other | Admitting: Pharmacist

## 2014-05-19 ENCOUNTER — Ambulatory Visit (INDEPENDENT_AMBULATORY_CARE_PROVIDER_SITE_OTHER): Payer: Medicare Other

## 2014-05-19 ENCOUNTER — Encounter: Payer: Self-pay | Admitting: Pharmacist

## 2014-05-19 VITALS — Ht 63.0 in | Wt 153.0 lb

## 2014-05-19 DIAGNOSIS — R7989 Other specified abnormal findings of blood chemistry: Secondary | ICD-10-CM

## 2014-05-19 DIAGNOSIS — Z13820 Encounter for screening for osteoporosis: Secondary | ICD-10-CM

## 2014-05-19 DIAGNOSIS — M8580 Other specified disorders of bone density and structure, unspecified site: Secondary | ICD-10-CM | POA: Diagnosis not present

## 2014-05-19 DIAGNOSIS — N3946 Mixed incontinence: Secondary | ICD-10-CM | POA: Diagnosis not present

## 2014-05-19 DIAGNOSIS — N8111 Cystocele, midline: Secondary | ICD-10-CM | POA: Diagnosis not present

## 2014-05-19 LAB — HM DEXA SCAN

## 2014-05-19 MED ORDER — RANITIDINE HCL 150 MG PO CAPS: 150.0000 mg | ORAL_CAPSULE | Freq: Every evening | ORAL | Status: DC

## 2014-05-19 NOTE — Progress Notes (Signed)
Patient ID: [name], female   DOB: 18-Nov-1934, 80 y.o.   MRN: [id number]  [facility] Current Height: Height: 5\' 3"  (160 cm)      Max Lifetime Height:  5\' 4"  Current Weight: Weight: 153 lb (69.4 kg)       Ethnicity:African American    HPI: Does pt already have a diagnosis of:  Osteopenia?  Yes Osteoporosis?  No  Back Pain?  Yes   - history of back surgery   History of right hip replacement 2013   Kyphosis?  No Prior fracture?  No Med(s) for Osteoporosis/Osteopenia:  raloxifene Med(s) previously tried for Osteoporosis/Osteopenia:  Alendronate for 2 years - was discontinued in 2013 by her pulmonologist due to concerns about reflux or cough                                                             PMH: Age at menopause:  80yo Hysterectomy?  Yes Oophorectomy?  Yes HRT? Yes - Former.  Type/duration: premarin for 15 to 20 years Steroid Use?  Yes - Current.  Type/duration: inhaled corticosteroids - Symbicort Thyroid med?  No History of cancer?  No History of digestive disorders (ie Crohn's)?  Yes; GERD - on chronic PPI and H2 blocker Current or previous eating disorders?  No Last Vitamin D Result:  23.9 (05/18/2013) Last GFR Result:  61 (08/18/2013)   FH/SH: Family history of osteoporosis?  Yes - mother Parent with history of hip fracture?  No Family history of breast cancer?  No Exercise?  Yes - walks Smoking?  Yes - 2 cigs per day  Alcohol?  No    Calcium Assessment Calcium Intake  # of servings/day  Calcium mg  Milk (8 oz) 1  x  300  = 300mg   Yogurt (4 oz) 0 x  200 = 0  Cheese (1 oz) 0 x  200 = 0  Other Calcium sources   250mg   Ca supplement 315mg  = 315mg    Estimated calcium intake per day 865mg     DEXA Results Date of Test T-Score for AP Spine L1-L4 T-Score for Total Left Hip T-Score for Total Right Hip  05/19/2014 -1.8 -1.5 --  03/03/2007 -1.8 -1.5 --  10/3/02006 -1.7 -1.6 --        FRAX 10 year estimate: Total FX risk:  14%  (consider  medication if >/= 20%) Hip FX risk:  5.4% (consider medication if >/= 3%)  Assessment: Osteopenia with high estimated risk of fracture per FRAX estimate. Stable BMD compared to 2008  Recommendations: 1.  Continue reloxifene 60mg  1 tablet daily 2.  recommend calcium 1200mg  daily through supplementation or diet.  3.  recommend weight bearing exercise - 30 minutes at least 4 days per week.   4.  Counseled and educated about fall risk and prevention. 5.  Discussed benefits of smoking cessation on BMD - patient is trying to quit and has decreased over the last year. Offer nicotine replacement therapy - patient declined Recheck DEXA:  2 years  Time spent counseling patient:  30 minutes   , PharmD, CPP

## 2014-05-19 NOTE — Patient Instructions (Signed)
Fall Prevention and Home Safety Falls cause injuries and can affect all age groups. It is possible to use preventive measures to significantly decrease the likelihood of falls. There are many simple measures which can make your home safer and prevent falls. OUTDOORS  Repair cracks and edges of walkways and driveways.  Remove high doorway thresholds.  Trim shrubbery on the main path into your home.  Have good outside lighting.  Clear walkways of tools, rocks, debris, and clutter.  Check that handrails are not broken and are securely fastened. Both sides of steps should have handrails.  Have leaves, snow, and ice cleared regularly.  Use sand or salt on walkways during winter months.  In the garage, clean up grease or oil spills. BATHROOM  Install night lights.  Install grab bars by the toilet and in the tub and shower.  Use non-skid mats or decals in the tub or shower.  Place a plastic non-slip stool in the shower to sit on, if needed.  Keep floors dry and clean up all water on the floor immediately.  Remove soap buildup in the tub or shower on a regular basis.  Secure bath mats with non-slip, double-sided rug tape.  Remove throw rugs and tripping hazards from the floors. BEDROOMS  Install night lights.  Make sure a bedside light is easy to reach.  Do not use oversized bedding.  Keep a telephone by your bedside.  Have a firm chair with side arms to use for getting dressed.  Remove throw rugs and tripping hazards from the floor. KITCHEN  Keep handles on pots and pans turned toward the center of the stove. Use back burners when possible.  Clean up spills quickly and allow time for drying.  Avoid walking on wet floors.  Avoid hot utensils and knives.  Position shelves so they are not too high or low.  Place commonly used objects within easy reach.  If necessary, use a sturdy step stool with a grab bar when reaching.  Keep electrical cables out of the  way.  Do not use floor polish or wax that makes floors slippery. If you must use wax, use non-skid floor wax.  Remove throw rugs and tripping hazards from the floor. STAIRWAYS  Never leave objects on stairs.  Place handrails on both sides of stairways and use them. Fix any loose handrails. Make sure handrails on both sides of the stairways are as long as the stairs.  Check carpeting to make sure it is firmly attached along stairs. Make repairs to worn or loose carpet promptly.  Avoid placing throw rugs at the top or bottom of stairways, or properly secure the rug with carpet tape to prevent slippage. Get rid of throw rugs, if possible.  Have an electrician put in a light switch at the top and bottom of the stairs. OTHER FALL PREVENTION TIPS  Wear low-heel or rubber-soled shoes that are supportive and fit well. Wear closed toe shoes.  When using a stepladder, make sure it is fully opened and both spreaders are firmly locked. Do not climb a closed stepladder.  Add color or contrast paint or tape to grab bars and handrails in your home. Place contrasting color strips on first and last steps.  Learn and use mobility aids as needed. Install an electrical emergency response system.  Turn on lights to avoid dark areas. Replace light bulbs that burn out immediately. Get light switches that glow.  Arrange furniture to create clear pathways. Keep furniture in the same place.    Firmly attach carpet with non-skid or double-sided tape.  Eliminate uneven floor surfaces.  Select a carpet pattern that does not visually hide the edge of steps.  Be aware of all pets. OTHER HOME SAFETY TIPS  Set the water temperature for 120 F (48.8 C).  Keep emergency numbers on or near the telephone.  Keep smoke detectors on every level of the home and near sleeping areas. Document Released: 04/06/2002 Document Revised: 10/16/2011 Document Reviewed: 07/06/2011 [employer] Patient Information 2015  [employer]. This information is not intended to replace advice given to you by your health care provider. Make sure you discuss any questions you have with your health care provider.                Exercise for Strong Bones  Exercise is important to build and maintain strong bones / bone density.  There are 2 types of exercises that are important to building and maintaining strong bones:  Weight- bearing and muscle-stregthening.  Weight-bearing Exercises  These exercises include activities that make you move against gravity while staying upright. Weight-bearing exercises can be high-impact or low-impact.  High-impact weight-bearing exercises help build bones and keep them strong. If you have broken a bone due to osteoporosis or are at risk of breaking a bone, you may need to avoid high-impact exercises. If you're not sure, you should check with your healthcare provider.  Examples of high-impact weight-bearing exercises are: Dancing  Doing high-impact aerobics  Hiking  Jogging/running  Jumping Rope  Stair climbing  Tennis  Low-impact weight-bearing exercises can also help keep bones strong and are a safe alternative if you cannot do high-impact exercises.   Examples of low-impact weight-bearing exercises are: Using elliptical training machines  Doing low-impact aerobics  Using stair-step machines  Fast walking on a treadmill or outside   Muscle-Strengthening Exercises These exercises include activities where you move your body, a weight or some other resistance against gravity. They are also known as resistance exercises and include: Lifting weights  Using elastic exercise bands  Using weight machines  Lifting your own body weight  Functional movements, such as standing and rising up on your toes  Yoga and Pilates can also improve strength, balance and flexibility. However, certain positions may not be safe for people with osteoporosis or those at increased risk of broken  bones. For example, exercises that have you bend forward may increase the chance of breaking a bone in the spine.   Non-Impact Exercises There are other types of exercises that can help prevent falls.  Non-impact exercises can help you to improve balance, posture and how well you move in everyday activities. Some of these exercises include: Balance exercises that strengthen your legs and test your balance, such as Tai Chi, can decrease your risk of falls.  Posture exercises that improve your posture and reduce rounded or "sloping" shoulders can help you decrease the chance of breaking a bone, especially in the spine.  Functional exercises that improve how well you move can help you with everyday activities and decrease your chance of falling and breaking a bone. For example, if you have trouble getting up from a chair or climbing stairs, you should do these activities as exercises.   **A physical therapist can teach you balance, posture and functional exercises. He/she can also help you learn which exercises are safe and appropriate for you.  [facility] has a physical therapy office in [place] in front of our office and referrals can be made for assessments   and treatment as needed and strength and balance training.  If you would like to have an assessment with [facility] and our physical therapy team please let a nurse or provider know.    

## 2014-06-16 ENCOUNTER — Other Ambulatory Visit: Payer: Self-pay | Admitting: Nurse Practitioner

## 2014-06-16 NOTE — Telephone Encounter (Signed)
Last seen 03/20/14 MMM

## 2014-07-07 ENCOUNTER — Ambulatory Visit (INDEPENDENT_AMBULATORY_CARE_PROVIDER_SITE_OTHER): Payer: Medicare Other | Admitting: Family

## 2014-07-07 ENCOUNTER — Encounter: Payer: Self-pay | Admitting: Family

## 2014-07-07 VITALS — BP 147/69 | HR 50 | Temp 97.3°F | Ht 63.0 in | Wt 157.2 lb

## 2014-07-07 DIAGNOSIS — K219 Gastro-esophageal reflux disease without esophagitis: Secondary | ICD-10-CM | POA: Diagnosis not present

## 2014-07-07 DIAGNOSIS — I10 Essential (primary) hypertension: Secondary | ICD-10-CM

## 2014-07-07 DIAGNOSIS — J45909 Unspecified asthma, uncomplicated: Secondary | ICD-10-CM

## 2014-07-07 DIAGNOSIS — F411 Generalized anxiety disorder: Secondary | ICD-10-CM

## 2014-07-07 DIAGNOSIS — J309 Allergic rhinitis, unspecified: Secondary | ICD-10-CM | POA: Diagnosis not present

## 2014-07-07 DIAGNOSIS — M899 Disorder of bone, unspecified: Secondary | ICD-10-CM | POA: Diagnosis not present

## 2014-07-07 DIAGNOSIS — M1990 Unspecified osteoarthritis, unspecified site: Secondary | ICD-10-CM

## 2014-07-07 DIAGNOSIS — M8580 Other specified disorders of bone density and structure, unspecified site: Secondary | ICD-10-CM | POA: Diagnosis not present

## 2014-07-07 DIAGNOSIS — E559 Vitamin D deficiency, unspecified: Secondary | ICD-10-CM

## 2014-07-07 DIAGNOSIS — F329 Major depressive disorder, single episode, unspecified: Secondary | ICD-10-CM

## 2014-07-07 DIAGNOSIS — Z23 Encounter for immunization: Secondary | ICD-10-CM

## 2014-07-07 DIAGNOSIS — N3281 Overactive bladder: Secondary | ICD-10-CM | POA: Diagnosis not present

## 2014-07-07 DIAGNOSIS — E785 Hyperlipidemia, unspecified: Secondary | ICD-10-CM | POA: Diagnosis not present

## 2014-07-07 DIAGNOSIS — F32A Depression, unspecified: Secondary | ICD-10-CM

## 2014-07-07 MED ORDER — IBUPROFEN 800 MG PO TABS: 800.0000 mg | ORAL_TABLET | Freq: Four times a day (QID) | ORAL | Status: DC | PRN

## 2014-07-07 MED ORDER — BUDESONIDE-FORMOTEROL FUMARATE 160-4.5 MCG/ACT IN AERO: 2.0000 | INHALATION_SPRAY | Freq: Two times a day (BID) | RESPIRATORY_TRACT | Status: DC

## 2014-07-07 NOTE — Progress Notes (Signed)
Subjective:    Patient ID: [name], female    DOB: 05-29-1934, 80 y.o.   MRN: [id number]  Hyperlipidemia This is a chronic problem. The current episode started more than 1 year ago. The problem is controlled. Recent lipid tests were reviewed and are normal. She has no history of diabetes or hypothyroidism. Factors aggravating her hyperlipidemia include smoking. Pertinent negatives include no leg pain, myalgias or shortness of breath. Current antihyperlipidemic treatment includes statins. The current treatment provides moderate improvement of lipids. Risk factors for coronary artery disease include dyslipidemia, hypertension and post-menopausal.  Hypertension This is a chronic problem. The current episode started more than 1 year ago. The problem has been resolved since onset. The problem is controlled. Associated symptoms include anxiety and peripheral edema ("some times in my left foot'). Pertinent negatives include no headaches, palpitations or shortness of breath. Risk factors for coronary artery disease include dyslipidemia, obesity and post-menopausal state. Past treatments include beta blockers and angiotensin blockers. The current treatment provides significant improvement. There is no history of kidney disease, CAD/MI, heart failure or a thyroid problem. There is no history of sleep apnea.  Gastrophageal Reflux She reports no belching, no coughing, no heartburn or no sore throat. This is a chronic problem. The current episode started more than 1 year ago. The problem occurs rarely. The problem has been resolved. The symptoms are aggravated by lying down. She has tried a PPI for the symptoms. The treatment provided significant relief.  Anxiety Presents for follow-up visit. Symptoms include depressed mood. Patient reports no excessive worry, irritability, nervous/anxious behavior, palpitations or shortness of breath. Symptoms occur rarely.   Her past medical history is significant for  anxiety/panic attacks and depression. Past treatments include SSRIs and benzodiazephines.      Review of Systems  Constitutional: Negative.  Negative for irritability.  HENT: Negative.  Negative for sore throat.   Eyes: Negative.   Respiratory: Negative.  Negative for cough and shortness of breath.   Cardiovascular: Negative.  Negative for palpitations.  Gastrointestinal: Negative.  Negative for heartburn.  Endocrine: Negative.   Genitourinary: Negative.   Musculoskeletal: Negative.  Negative for myalgias.  Neurological: Negative.  Negative for headaches.  Hematological: Negative.   Psychiatric/Behavioral: Negative.  The patient is not nervous/anxious.   All other systems reviewed and are negative.      Objective:   Physical Exam  Constitutional: She is oriented to person, place, and time. She appears well-developed and well-nourished. No distress.  HENT:  Head: Normocephalic and atraumatic.  Right Ear: External ear normal.  Left Ear: External ear normal.  Nose: Nose normal.  Mouth/Throat: Oropharynx is clear and moist.  Eyes: Pupils are equal, round, and reactive to light.  Neck: Normal range of motion. Neck supple. No thyromegaly present.  Cardiovascular: Normal rate, regular rhythm, normal heart sounds and intact distal pulses.   No murmur heard. Pulmonary/Chest: Effort normal and breath sounds normal. No respiratory distress. She has no wheezes.  Abdominal: Soft. Bowel sounds are normal. She exhibits no distension. There is no tenderness.  Musculoskeletal: Normal range of motion. She exhibits no edema or tenderness.  Neurological: She is alert and oriented to person, place, and time. She has normal reflexes. No cranial nerve deficit.  Skin: Skin is warm and dry.  Psychiatric: She has a normal mood and affect. Her behavior is normal. Judgment and thought content normal.  Vitals reviewed.   BP 147/69 mmHg  Pulse 50  Temp(Src) 97.3 F (36.3 C) (Oral)  Ht  5' 3"  (1.6 m)   Wt 157 lb 3.2 oz (71.305 kg)  BMI 27.85 kg/m2       Assessment & Plan:  1. Essential hypertension - CMP14+EGFR  2. Allergic rhinitis, unspecified allergic rhinitis type - CMP14+EGFR  3. Gastroesophageal reflux disease without esophagitis - CMP14+EGFR  4. Osteopenia - CMP14+EGFR  5. Overactive bladder - CMP14+EGFR  6. Vitamin D deficiency - CMP14+EGFR - Vit D  25 hydroxy (rtn osteoporosis monitoring)  7. GAD (generalized anxiety disorder) - CMP14+EGFR  8. Depression - CMP14+EGFR  9. Hyperlipidemia - CMP14+EGFR - Lipid panel  10. Osteoarthritis, unspecified osteoarthritis type, unspecified site - CMP14+EGFR - ibuprofen (ADVIL,MOTRIN) 800 MG tablet; Take 1 tablet (800 mg total) by mouth every 6 (six) hours as needed.  Dispense: 30 tablet; Refill: 3  11. Asthma, chronic, unspecified asthma severity, uncomplicated - IPJ82+NKNL - budesonide-formoterol (SYMBICORT) 160-4.5 MCG/ACT inhaler; Inhale 2 puffs into the lungs 2 (two) times daily.  Dispense: 1 Inhaler; Refill: 3   Continue all meds Labs pending Health Maintenance reviewed Diet and exercise encouraged RTO 6 months  Evelina Dun, FNP

## 2014-07-07 NOTE — Patient Instructions (Signed)

## 2014-07-07 NOTE — Addendum Note (Signed)
Addended by: Almeta Monas on: 07/07/2014 11:10 AM   Modules accepted: Orders

## 2014-07-08 ENCOUNTER — Other Ambulatory Visit: Payer: Self-pay | Admitting: Family

## 2014-07-08 LAB — LIPID PANEL
Chol/HDL Ratio: 2.6 ratio units (ref 0.0–4.4)
Cholesterol, Total: 188 mg/dL (ref 100–199)
HDL: 71 mg/dL (ref >39)
LDL Calculated: 102 mg/dL — ABNORMAL HIGH (ref 0–99)
Triglycerides: 74 mg/dL (ref 0–149)
VLDL CHOLESTEROL CAL: 15 mg/dL (ref 5–40)

## 2014-07-08 LAB — CMP14+EGFR
ALBUMIN: 3.9 g/dL (ref 3.5–4.8)
ALT: 17 IU/L (ref 0–32)
AST: 23 IU/L (ref 0–40)
Albumin/Globulin Ratio: 1.6 (ref 1.1–2.5)
Alkaline Phosphatase: 107 IU/L (ref 39–117)
BUN / CREAT RATIO: 15 (ref 11–26)
BUN: 14 mg/dL (ref 8–27)
CO2: 22 mmol/L (ref 18–29)
CREATININE: 0.91 mg/dL (ref 0.57–1.00)
Calcium: 8.9 mg/dL (ref 8.7–10.3)
Chloride: 106 mmol/L (ref 97–108)
GFR calc Af Amer: 70 mL/min/{1.73_m2} (ref >59)
GFR, EST NON AFRICAN AMERICAN: 61 mL/min/{1.73_m2} (ref >59)
GLOBULIN, TOTAL: 2.4 g/dL (ref 1.5–4.5)
Glucose: 87 mg/dL (ref 65–99)
Potassium: 4.5 mmol/L (ref 3.5–5.2)
SODIUM: 143 mmol/L (ref 134–144)
Total Protein: 6.3 g/dL (ref 6.0–8.5)

## 2014-07-08 LAB — VITAMIN D 25 HYDROXY (VIT D DEFICIENCY, FRACTURES): Vit D, 25-Hydroxy: 27.9 ng/mL — ABNORMAL LOW (ref 30.0–100.0)

## 2014-07-29 ENCOUNTER — Other Ambulatory Visit: Payer: Self-pay | Admitting: Family

## 2014-08-04 ENCOUNTER — Other Ambulatory Visit: Payer: Self-pay | Admitting: Nurse Practitioner

## 2014-08-09 ENCOUNTER — Encounter: Payer: Self-pay | Admitting: Pharmacist

## 2014-08-09 ENCOUNTER — Ambulatory Visit (INDEPENDENT_AMBULATORY_CARE_PROVIDER_SITE_OTHER): Payer: Medicare Other | Admitting: Pharmacist

## 2014-08-09 ENCOUNTER — Other Ambulatory Visit: Payer: Self-pay | Admitting: Pharmacist

## 2014-08-09 VITALS — BP 140/68 | HR 72 | Ht 62.0 in | Wt 153.0 lb

## 2014-08-09 DIAGNOSIS — K219 Gastro-esophageal reflux disease without esophagitis: Secondary | ICD-10-CM

## 2014-08-09 DIAGNOSIS — Z720 Tobacco use: Secondary | ICD-10-CM

## 2014-08-09 DIAGNOSIS — J45909 Unspecified asthma, uncomplicated: Secondary | ICD-10-CM | POA: Diagnosis not present

## 2014-08-09 DIAGNOSIS — M1990 Unspecified osteoarthritis, unspecified site: Secondary | ICD-10-CM | POA: Diagnosis not present

## 2014-08-09 DIAGNOSIS — Z0000 Encounter for general adult medical examination without abnormal findings: Secondary | ICD-10-CM

## 2014-08-09 DIAGNOSIS — Z87891 Personal history of nicotine dependence: Secondary | ICD-10-CM

## 2014-08-09 MED ORDER — ALPRAZOLAM 0.5 MG PO TABS: ORAL_TABLET | ORAL | Status: DC

## 2014-08-09 MED ORDER — IBUPROFEN 800 MG PO TABS
800.0000 mg | ORAL_TABLET | Freq: Four times a day (QID) | ORAL | Status: DC | PRN
Start: 2014-08-09 — End: 2014-11-08

## 2014-08-09 MED ORDER — BUPROPION HCL ER (SR) 150 MG PO TB12
150.0000 mg | ORAL_TABLET | Freq: Two times a day (BID) | ORAL | Status: DC
Start: 2014-08-09 — End: 2014-12-10

## 2014-08-09 MED ORDER — ESOMEPRAZOLE MAGNESIUM 40 MG PO CPDR: 40.0000 mg | DELAYED_RELEASE_CAPSULE | Freq: Every day | ORAL | Status: DC

## 2014-08-09 MED ORDER — FLUTICASONE PROPIONATE 50 MCG/ACT NA SUSP: 2.0000 | Freq: Every day | NASAL | Status: DC

## 2014-08-09 MED ORDER — BUDESONIDE-FORMOTEROL FUMARATE 160-4.5 MCG/ACT IN AERO: 2.0000 | INHALATION_SPRAY | Freq: Two times a day (BID) | RESPIRATORY_TRACT | Status: DC

## 2014-08-09 NOTE — Progress Notes (Signed)
Patient ID: [name], female   DOB: 04-14-1935, 80 y.o.   MRN: [id number]   Subjective:   [name] is a 80 y.o. female who presents for an Aubsequent Medicare Annual Wellness Visit.   Current Medications (verified) Outpatient Encounter Prescriptions as of 08/09/2014  Medication Sig  . amitriptyline (ELAVIL) 25 MG tablet TAKE ONE TABLET BY MOUTH AT BEDTIME  . aspirin EC 81 MG tablet Take 81 mg by mouth daily.  . budesonide-formoterol (SYMBICORT) 160-4.5 MCG/ACT inhaler Inhale 2 puffs into the lungs 2 (two) times daily.  . carvedilol (COREG) 6.25 MG tablet Take 6.25 mg by mouth 2 (two) times daily with a meal.  . cholecalciferol (VITAMIN D) 1000 UNITS tablet Take 1,000 Units by mouth daily.  Marland Kitchen esomeprazole (NEXIUM) 40 MG capsule Take 1 capsule (40 mg total) by mouth daily before breakfast.  . furosemide (LASIX) 20 MG tablet TAKE ONE HALF TABLET BY MOUTH EVERY DAY  . ibuprofen (ADVIL,MOTRIN) 800 MG tablet Take 1 tablet (800 mg total) by mouth every 6 (six) hours as needed.  Marland Kitchen losartan (COZAAR) 50 MG tablet Take 1 tablet (50 mg total) by mouth daily.  Marland Kitchen oxybutynin (DITROPAN-XL) 10 MG 24 hr tablet Take 10 mg by mouth daily.  . raloxifene (EVISTA) 60 MG tablet Take 1 tablet (60 mg total) by mouth daily.  . ranitidine (ZANTAC) 150 MG capsule Take 1 capsule (150 mg total) by mouth every evening.  . simvastatin (ZOCOR) 10 MG tablet Take 1 tablet (10 mg total) by mouth at bedtime.  . vitamin B-12 (CYANOCOBALAMIN) 1000 MCG tablet Take 500 mcg by mouth daily.   . Vitamin D, Ergocalciferol, (DRISDOL) 50000 UNITS CAPS capsule TAKE ONE CAPSULE BY MOUTH WEEKLY  . [DISCONTINUED] budesonide-formoterol (SYMBICORT) 160-4.5 MCG/ACT inhaler Inhale 2 puffs into the lungs 2 (two) times daily. (Patient taking differently: Inhale 2 puffs into the lungs 2 (two) times a week. )  . [DISCONTINUED] esomeprazole (NEXIUM) 40 MG capsule Take 1 capsule (40 mg total) by mouth daily before breakfast.  .  [DISCONTINUED] ibuprofen (ADVIL,MOTRIN) 800 MG tablet Take 1 tablet (800 mg total) by mouth every 6 (six) hours as needed.  . ALPRAZolam (XANAX) 0.5 MG tablet TAKE ONE TABLET BY MOUTH DAILY AS NEEDED FOR ANXIETY  . Biotin 5000 MCG CAPS Take 1 capsule by mouth daily.  Marland Kitchen buPROPion (WELLBUTRIN SR) 150 MG 12 hr tablet Take 1 tablet (150 mg total) by mouth 2 (two) times daily.  . calcium citrate-vitamin D (CITRACAL+D) 315-200 MG-UNIT per tablet Take 1 tablet by mouth daily.  . cetirizine (ZYRTEC) 10 MG tablet TAKE 1 TABLET (10 MG TOTAL) BY MOUTH DAILY. (Patient not taking: Reported on 08/09/2014)  . fluticasone (FLONASE) 50 MCG/ACT nasal spray Place 2 sprays into both nostrils daily.  . [DISCONTINUED] ALPRAZolam (XANAX) 0.5 MG tablet TAKE ONE TABLET BY MOUTH DAILY AS NEEDED FOR ANXIETY  . [DISCONTINUED] buPROPion (WELLBUTRIN SR) 150 MG 12 hr tablet Take 1 tablet (150 mg total) by mouth 2 (two) times daily.  . [DISCONTINUED] carvedilol (COREG) 3.125 MG tablet Take 1 tablet (3.125 mg total) by mouth daily. (Patient not taking: Reported on 08/09/2014)  . [DISCONTINUED] mirabegron ER (MYRBETRIQ) 50 MG TB24 tablet Take 50 mg by mouth daily.    Allergies (verified) Codeine; Lisinopril-hydrochlorothiazide; and Penicillins   History: Past Medical History  Diagnosis Date  . Essential hypertension, benign   . Mixed hyperlipidemia   . Depression   . Allergic rhinitis   . GERD (gastroesophageal reflux disease)   .  Pelvic pain   . Arthritis   . History of blood transfusion   . Left bundle branch block   . History of cardiac catheterization     Normal coronaries 2010  . Secondary cardiomyopathy     LVEF 40-45%, likely nonischemic  . Anxiety   . Osteopenia   . Cataract     had eye surgery   Past Surgical History  Procedure Laterality Date  . Cholecystectomy  1991  . Tonsillectomy    . Appendectomy    . Back surgery  07/21/2008  . Rotator cuff repair  2011    right  . Cataract extraction  w/phaco  04/16/2011    Procedure: CATARACT EXTRACTION PHACO AND INTRAOCULAR LENS PLACEMENT (IOC);  Surgeon: Susa Simmonds;  Location: [facility];  Service: Ophthalmology;  Laterality: Left;  CDE=11.35  . Hammer toe surgery  11/22/11    [facility], [name]  . Cataract extraction w/phaco  01/28/2012    Procedure: CATARACT EXTRACTION PHACO AND INTRAOCULAR LENS PLACEMENT (IOC);  Surgeon: Susa Simmonds, MD;  Location: [facility];  Service: Ophthalmology;  Laterality: Right;  CDI:8.15  . Cysto with hydrodistension  05/29/2012    Procedure: CYSTOSCOPY/HYDRODISTENSION;  Surgeon: Martina Sinner, MD;  Location: [facility];  Service: Urology;  Laterality: N/A;  INSTILLATION OF MARCAINE AND PYRIDIUM   . Lumbar laminectomy/decompression microdiscectomy Left 06/06/2012    Procedure: LUMBAR LAMINECTOMY/DECOMPRESSION MICRODISCECTOMY 1 LEVEL;  Surgeon: Maeola Harman, MD;  Location: [facility];  Service: Neurosurgery;  Laterality: Left;  Left Lumbar two-three Laminectomy for resection of synovial cyst  . Total hip arthroplasty Right 03/13/2013    Procedure: TOTAL HIP ARTHROPLASTY;  Surgeon: Nestor Lewandowsky, MD;  Location: [facility];  Service: Orthopedics;  Laterality: Right;  . Eye surgery Bilateral     catar  . Abdominal hysterectomy      complete  . Spine surgery      back suregry 2011  . Joint replacement      right hip replacement 2013   Family History  Problem Relation Age of Onset  . Colon cancer Neg Hx   . Anesthesia problems Neg Hx   . Hypotension Neg Hx   . Malignant hyperthermia Neg Hx   . Pseudochol deficiency Neg Hx   . Diabetes Other   . Allergies Other   . Diabetes Father   . Diabetes Brother   . Cancer Brother 21    colon  . Diabetes Brother   . Diabetes Brother   . Hypertension Sister   . Diabetes Sister   . Diabetes Sister    Social History   Occupational History  . 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  Smoking status: Current Some Day Smoker -- 0.10 packs/day for 40 years    Types: Cigarettes  . Smokeless tobacco: Never Used     Comment: smokes 2 cigarettes a day  . Alcohol Use: No  . Drug Use: No  . Sexual Activity: No    Do you feel safe at home?  Yes  Dietary issues and exercise activities: Current Exercise Habits:: The patient does not participate in regular exercise at present  Current Dietary habits:  Patient concerned about increasing weight   Objective:    Today's Vitals   08/09/14 1525  BP: 140/68  Pulse: 72  Height: 5\' 2"  (1.575 m)  Weight: 153 lb (69.4 kg)   Body mass  index is 27.98 kg/(m^2).  Activities of Daily Living In your present state of health, do you have any difficulty performing the following activities: 08/09/2014 02/15/2014  Hearing? N N  Vision? N N  Difficulty concentrating or making decisions? Y N  Walking or climbing stairs? Y N  Dressing or bathing? N N  Doing errands, shopping? N N  Preparing Food and eating ? N -  Using the Toilet? N -  In the past six months, have you accidently leaked urine? Y -  Do you have problems with loss of bowel control? Y -  Managing your Medications? Y -  Managing your Finances? N -  Housekeeping or managing your Housekeeping? N -     Cardiac Risk Factors include: advanced age (>46men, >74 women);dyslipidemia;hypertension;sedentary lifestyle;smoking/ tobacco exposure  Depression Screen PHQ 2/9 Scores 08/09/2014 03/20/2014 02/15/2014 08/18/2013  PHQ - 2 Score 2 0 1 0  PHQ- 9 Score 8 - - -    Fall Risk Fall Risk  08/09/2014 03/20/2014 02/15/2014 08/18/2013  Falls in the past year? No No No No    Cognitive Function: MMSE - Mini Mental State Exam 08/09/2014 05/18/2013  Orientation to time 5 5  Orientation to Place 5 5  Registration 3 3  Attention/ Calculation 5 1  Recall 2 3  Language- name 2 objects 2 2  Language- repeat 1 1  Language- follow 3 step command 3 3  Language- read & follow direction 1 1    Write a sentence 1 1  Copy design 0 1  Total score 28 26    Immunizations and Health Maintenance Immunization History  Administered Date(s) Administered  . Influenza Split 01/29/2011, 01/29/2012, 01/26/2013  . Influenza,inj,Quad PF,36+ Mos 12/07/2013  . Pneumococcal Conjugate-13 07/07/2014  . Pneumococcal Polysaccharide-23 01/28/2010  . Zoster 10/25/2011   There are no preventive care reminders to display for this patient.  Patient Care Team: Junie Spencer, FNP as PCP - General (Nurse Practitioner) Rachael Fee, MD (Gastroenterology) Rollene Rotunda, MD as Consulting Physician (Cardiology) Alfredo Martinez, MD as Consulting Physician (Urology) Kalman Shan, MD as Consulting Physician (Pulmonary Disease)  Indicate any recent Medical Services you may have received from other than [facility] providers in the past year (date may be approximate).    Assessment:    Annual Wellness Visit  Weight / Diet Concerns Depression - recent death of brother and patient has run out of buproprion Seasonal Allergies   Screening Tests Health Maintenance  Topic Date Due  . INFLUENZA VACCINE  11/29/2014  . DEXA SCAN  05/19/2016  . TETANUS/TDAP  11/28/2016  . COLONOSCOPY  04/30/2017  . ZOSTAVAX  Completed  . PNA vac Low Risk Adult  Completed        Plan:   During the course of the visit [name] was educated and counseled about the following appropriate screening and preventive services:   Vaccines to include Pneumoccal, Influenza, Hepatitis B, Td, Zostavax - UTD  Colorectal cancer screening - UTD  Cardiovascular disease screening - Seeing Dr Kirtland Bouchard 08/11/14  Diabetes screening - UTD  Bone Denisty / Osteoporosis Screening - UTD  Mammogram - UTD  PAP - UTD  Glaucoma screening-Recommended patient make appt with Dr. Lita Mains. Nutrition counseling  - Increase non-starchy vegetables - carrots, green bean, squash, zucchini, tomatoes, onions, peppers, spinach and other green leafy  vegetables, cabbage, lettuce, cucumbers, asparagus, okra (not fried), eggplant.  limit sugar and processed foods (cakes, cookies, ice cream, crackers and chips.  Increase fresh fruit but limit serving  sizes 1/2 cup or about the size of tennis or baseball.  Limit red meat to no more than 1-2 times per week (serving size about the size of your palm).  Choose whole grains / lean proteins - whole wheat bread, quinoa, whole grain rice (1/2 cup), fish, chicken, [place]  Smoking cessation counseling - patient has decreased significantly over the last few months.  Continue bupropion (will also help with depression) Rx given to patient today  Advanced Directives - packet given to patient.   Start flonase 2 sprays in each nostril once daily for allergies  Recommended Silver Sneakers Program.   Patient Instructions (the written plan) were given to the patient.   Henrene Pastor, [facility]   08/09/2014

## 2014-08-09 NOTE — Patient Instructions (Signed)
Make follow up eye exam with Dr Larry Sierras  Look into Silver Sneaker program for exercise at [facility] - you insurance might pay for this.  They have great balance classes.   Increase non-starchy vegetables - carrots, green bean, squash, zucchini, tomatoes, onions, peppers, spinach and other green leafy vegetables, cabbage, lettuce, cucumbers, asparagus, okra (not fried), eggplant limit sugar and processed foods (cakes, cookies, ice cream, crackers and chips) Increase fresh fruit but limit serving sizes 1/2 cup or about the size of tennis or baseball limit red meat to no more than 1-2 times per week (serving size about the size of your palm) Choose whole grains / lean proteins - whole wheat bread, quinoa, whole grain rice (1/2 cup), fish, chicken, [place]   Fall Prevention and Home Safety Falls cause injuries and can affect all age groups. It is possible to use preventive measures to significantly decrease the likelihood of falls. There are many simple measures which can make your home safer and prevent falls. OUTDOORS  Repair cracks and edges of walkways and driveways.  Remove high doorway thresholds.  Trim shrubbery on the main path into your home.  Have good outside lighting.  Clear walkways of tools, rocks, debris, and clutter.  Check that handrails are not broken and are securely fastened. Both sides of steps should have handrails.  Have leaves, snow, and ice cleared regularly.  Use sand or salt on walkways during winter months.  In the garage, clean up grease or oil spills. BATHROOM  Install night lights.  Install grab bars by the toilet and in the tub and shower.  Use non-skid mats or decals in the tub or shower.  Place a plastic non-slip stool in the shower to sit on, if needed.  Keep floors dry and clean up all water on the floor immediately.  Remove soap buildup in the tub or shower on a regular basis.  Secure bath mats with non-slip, double-sided rug  tape.  Remove throw rugs and tripping hazards from the floors. BEDROOMS  Install night lights.  Make sure a bedside light is easy to reach.  Do not use oversized bedding.  Keep a telephone by your bedside.  Have a firm chair with side arms to use for getting dressed.  Remove throw rugs and tripping hazards from the floor. KITCHEN  Keep handles on pots and pans turned toward the center of the stove. Use back burners when possible.  Clean up spills quickly and allow time for drying.  Avoid walking on wet floors.  Avoid hot utensils and knives.  Position shelves so they are not too high or low.  Place commonly used objects within easy reach.  If necessary, use a sturdy step stool with a grab bar when reaching.  Keep electrical cables out of the way.  Do not use floor polish or wax that makes floors slippery. If you must use wax, use non-skid floor wax.  Remove throw rugs and tripping hazards from the floor. STAIRWAYS  Never leave objects on stairs.  Place handrails on both sides of stairways and use them. Fix any loose handrails. Make sure handrails on both sides of the stairways are as long as the stairs.  Check carpeting to make sure it is firmly attached along stairs. Make repairs to worn or loose carpet promptly.  Avoid placing throw rugs at the top or bottom of stairways, or properly secure the rug with carpet tape to prevent slippage. Get rid of throw rugs, if possible.  Have an electrician put in a light switch at the top and bottom of the stairs. OTHER FALL PREVENTION TIPS  Wear low-heel or rubber-soled shoes that are supportive and fit well. Wear closed toe shoes.  When using a stepladder, make sure it is fully opened and both spreaders are firmly locked. Do not climb a closed stepladder.  Add color or contrast paint or tape to grab bars and handrails in your home. Place contrasting color strips on first and last steps.  Learn and use mobility aids as  needed. Install an electrical emergency response system.  Turn on lights to avoid dark areas. Replace light bulbs that burn out immediately. Get light switches that glow.  Arrange furniture to create clear pathways. Keep furniture in the same place.  Firmly attach carpet with non-skid or double-sided tape.  Eliminate uneven floor surfaces.  Select a carpet pattern that does not visually hide the edge of steps.  Be aware of all pets. OTHER HOME SAFETY TIPS  Set the water temperature for 120 F (48.8 C).  Keep emergency numbers on or near the telephone.  Keep smoke detectors on every level of the home and near sleeping areas. Document Released: 04/06/2002 Document Revised: 10/16/2011 Document Reviewed: 07/06/2011 [facility] Patient Information 2015 [place], [place]. This information is not intended to replace advice given to you by your health care provider. Make sure you discuss any questions you have with your health care provider.

## 2014-08-11 ENCOUNTER — Encounter: Payer: Self-pay | Admitting: Cardiology

## 2014-08-11 ENCOUNTER — Ambulatory Visit (INDEPENDENT_AMBULATORY_CARE_PROVIDER_SITE_OTHER): Payer: Medicare Other | Admitting: Cardiology

## 2014-08-11 VITALS — BP 122/80 | HR 61 | Ht 62.0 in | Wt 152.0 lb

## 2014-08-11 DIAGNOSIS — I429 Cardiomyopathy, unspecified: Secondary | ICD-10-CM | POA: Diagnosis not present

## 2014-08-11 DIAGNOSIS — I10 Essential (primary) hypertension: Secondary | ICD-10-CM

## 2014-08-11 NOTE — Patient Instructions (Signed)
The current medical regimen is effective;  continue present plan and medications.  Your physician has requested that you have an echocardiogram. Echocardiography is a painless test that uses sound waves to create images of your heart. It provides your doctor with information about the size and shape of your heart and how well your heart's chambers and valves are working. This procedure takes approximately one hour. There are no restrictions for this procedure.  Follow up in 1 year with Dr. Hochrein in [place].  You will receive a letter in the mail 2 months before you are due.  Please call us when you receive this letter to schedule your follow up appointment.  Thank you for choosing [facility]!!      

## 2014-08-11 NOTE — Progress Notes (Signed)
HPI The patient presents for follow-up of secondary to loss of consciousness which happened prior to the last clinic appointment with me. At that time workup did demonstrate a mildly reduced ejection fraction which had been noted previously. Since I last saw her she has had no further syncopal episodes. She is exercising routinely. The patient denies any new symptoms such as chest discomfort, neck or arm discomfort. There has been no new shortness of breath, PND or orthopnea. There have been no reported palpitations, presyncope or syncope.   Allergies  Allergen Reactions  . Codeine Itching and Swelling    eye irritation (prescribed percocet)  . Lisinopril-Hydrochlorothiazide     Lip swelling and cough. Stopped by ENT. Patient is on Losartan without any issues   . Penicillins Itching and Swelling    eye irritation    Current Outpatient Prescriptions  Medication Sig Dispense Refill  . ALPRAZolam (XANAX) 0.5 MG tablet TAKE ONE TABLET BY MOUTH DAILY AS NEEDED FOR ANXIETY 30 tablet 1  . amitriptyline (ELAVIL) 25 MG tablet TAKE ONE TABLET BY MOUTH AT BEDTIME 90 tablet 0  . aspirin EC 81 MG tablet Take 81 mg by mouth daily.    . Biotin 5000 MCG CAPS Take 1 capsule by mouth daily.    . budesonide-formoterol (SYMBICORT) 160-4.5 MCG/ACT inhaler Inhale 2 puffs into the lungs 2 (two) times daily. 1 Inhaler 3  . buPROPion (WELLBUTRIN SR) 150 MG 12 hr tablet Take 1 tablet (150 mg total) by mouth 2 (two) times daily. 180 tablet 1  . calcium citrate-vitamin D (CITRACAL+D) 315-200 MG-UNIT per tablet Take 1 tablet by mouth daily.    . carvedilol (COREG) 6.25 MG tablet Take 6.25 mg by mouth 2 (two) times daily with a meal.    . cetirizine (ZYRTEC) 10 MG tablet TAKE 1 TABLET (10 MG TOTAL) BY MOUTH DAILY. (Patient taking differently: TAKE 1 TABLET (10 MG TOTAL) BY MOUTH DAILY as needed for allergies) 30 tablet 2  . cholecalciferol (VITAMIN D) 1000 UNITS tablet Take 1,000 Units by mouth daily.    Marland Kitchen  esomeprazole (NEXIUM) 40 MG capsule Take 1 capsule (40 mg total) by mouth daily before breakfast. 90 capsule 1  . fluticasone (FLONASE) 50 MCG/ACT nasal spray Place 2 sprays into both nostrils daily. 16 g 2  . furosemide (LASIX) 20 MG tablet TAKE ONE HALF TABLET BY MOUTH EVERY DAY 30 tablet 5  . ibuprofen (ADVIL,MOTRIN) 800 MG tablet Take 1 tablet (800 mg total) by mouth every 6 (six) hours as needed. 60 tablet 0  . losartan (COZAAR) 50 MG tablet Take 1 tablet (50 mg total) by mouth daily. 90 tablet 1  . oxybutynin (DITROPAN-XL) 10 MG 24 hr tablet Take 10 mg by mouth daily.    . raloxifene (EVISTA) 60 MG tablet Take 1 tablet (60 mg total) by mouth daily. 90 tablet 1  . ranitidine (ZANTAC) 150 MG capsule Take 1 capsule (150 mg total) by mouth every evening. 90 capsule 1  . simvastatin (ZOCOR) 10 MG tablet Take 1 tablet (10 mg total) by mouth at bedtime. 90 tablet 1  . vitamin B-12 (CYANOCOBALAMIN) 1000 MCG tablet Take 500 mcg by mouth daily.     . Vitamin D, Ergocalciferol, (DRISDOL) 50000 UNITS CAPS capsule TAKE ONE CAPSULE BY MOUTH WEEKLY 12 capsule 6   No current facility-administered medications for this visit.    Past Medical History  Diagnosis Date  . Essential hypertension, benign   . Mixed hyperlipidemia   . Depression   .  Allergic rhinitis   . GERD (gastroesophageal reflux disease)   . Arthritis   . History of blood transfusion   . Left bundle branch block   . History of cardiac catheterization     Normal coronaries 2010  . Secondary cardiomyopathy     LVEF 40-45%, likely nonischemic  . Anxiety   . Osteopenia   . Cataract     had eye surgery    Past Surgical History  Procedure Laterality Date  . Cholecystectomy  1991  . Tonsillectomy    . Appendectomy    . Back surgery  07/21/2008  . Rotator cuff repair  2011    right  . Cataract extraction w/phaco  04/16/2011    Procedure: CATARACT EXTRACTION PHACO AND INTRAOCULAR LENS PLACEMENT (IOC);  Surgeon: Susa Simmonds;   Location: [facility];  Service: Ophthalmology;  Laterality: Left;  CDE=11.35  . Hammer toe surgery  11/22/11    [facility], [name]  . Cataract extraction w/phaco  01/28/2012    Procedure: CATARACT EXTRACTION PHACO AND INTRAOCULAR LENS PLACEMENT (IOC);  Surgeon: Susa Simmonds, MD;  Location: [facility];  Service: Ophthalmology;  Laterality: Right;  CDI:8.15  . Cysto with hydrodistension  05/29/2012    Procedure: CYSTOSCOPY/HYDRODISTENSION;  Surgeon: Martina Sinner, MD;  Location: [facility];  Service: Urology;  Laterality: N/A;  INSTILLATION OF MARCAINE AND PYRIDIUM   . Lumbar laminectomy/decompression microdiscectomy Left 06/06/2012    Procedure: LUMBAR LAMINECTOMY/DECOMPRESSION MICRODISCECTOMY 1 LEVEL;  Surgeon: Maeola Harman, MD;  Location: [facility];  Service: Neurosurgery;  Laterality: Left;  Left Lumbar two-three Laminectomy for resection of synovial cyst  . Total hip arthroplasty Right 03/13/2013    Procedure: TOTAL HIP ARTHROPLASTY;  Surgeon: Nestor Lewandowsky, MD;  Location: [facility];  Service: Orthopedics;  Laterality: Right;  . Eye surgery Bilateral     catar  . Abdominal hysterectomy      complete  . Spine surgery      back suregry 2011  . Joint replacement      right hip replacement 2013    ROS:  As stated in the HPI and negative for all other systems.  PHYSICAL EXAM BP 122/80 mmHg  Pulse 61  Ht 5\' 2"  (1.575 m)  Wt 152 lb (68.947 kg)  BMI 27.79 kg/m2 GENERAL:  Well appearing NECK:  No jugular venous distention, waveform within normal limits, carotid upstroke brisk and symmetric, no bruits, no thyromegaly LUNGS:  Clear to auscultation bilaterally CHEST:  Unremarkable HEART:  PMI not displaced or sustained,S1 and S2 within normal limits, no S3, no S4, no clicks, no rubs, no murmurs ABD:  Flat, positive bowel sounds normal in frequency in pitch, no bruits, no rebound, no guarding, no midline pulsatile mass, no hepatomegaly, no splenomegaly EXT:  2 plus pulses throughout, no  edema, no cyanosis no clubbing  EKG:  Sinus rhythm, Rate 61,  left bundle-branch block, probable right atrial enlargement, premature atrial contractions, no change from previous. 08/11/2014  ASSESSMENT AND PLAN  CARDIOMYOPATHY:   At this point her ejection fraction is slightly low.  I will repeat an echocardiogram in June.  HTN:  This is being managed in the context of treating his CHF  CAROTID DOPPLER:   She can have followup of this mild stenosis next year  TOBACCO:  I did discuss with her the need to stop smoking.   SYNCOPE:  She has had no further episodes. No further workup is suggested.

## 2014-08-16 DIAGNOSIS — R350 Frequency of micturition: Secondary | ICD-10-CM | POA: Diagnosis not present

## 2014-08-16 DIAGNOSIS — N3946 Mixed incontinence: Secondary | ICD-10-CM | POA: Diagnosis not present

## 2014-08-18 ENCOUNTER — Other Ambulatory Visit: Payer: Self-pay | Admitting: Nurse Practitioner

## 2014-08-19 ENCOUNTER — Other Ambulatory Visit: Payer: Self-pay | Admitting: General Practice

## 2014-08-19 ENCOUNTER — Other Ambulatory Visit: Payer: Self-pay

## 2014-08-19 NOTE — Telephone Encounter (Signed)
Last seen 07/07/14 [facility]  Pharmacy sent over for this med  Not on [facility] list

## 2014-08-20 MED ORDER — COLCHICINE 0.6 MG PO TABS: 0.6000 mg | ORAL_TABLET | Freq: Every day | ORAL | Status: DC

## 2014-08-21 ENCOUNTER — Ambulatory Visit (INDEPENDENT_AMBULATORY_CARE_PROVIDER_SITE_OTHER): Payer: Medicare Other | Admitting: Nurse Practitioner

## 2014-08-21 ENCOUNTER — Encounter: Payer: Self-pay | Admitting: Nurse Practitioner

## 2014-08-21 VITALS — BP 180/75 | HR 59 | Temp 97.2°F | Wt 158.2 lb

## 2014-08-21 DIAGNOSIS — M10071 Idiopathic gout, right ankle and foot: Secondary | ICD-10-CM

## 2014-08-21 DIAGNOSIS — M109 Gout, unspecified: Secondary | ICD-10-CM

## 2014-08-21 NOTE — Patient Instructions (Signed)

## 2014-08-21 NOTE — Progress Notes (Signed)
   Subjective:    Patient ID: [name], female    DOB: Aug 30, 1934, 80 y.o.   MRN: [id number]  HPI Patient in c/o of right ankle pian- started all the sudden 2 nights ago- pain when anything touches it. Painful to walk on. Rate pain 7/10 currently. She has had goutin the past and feels similar.    Review of Systems  Constitutional: Negative.   HENT: Negative.   Respiratory: Negative.   Cardiovascular: Negative.   Genitourinary: Negative.   Neurological: Negative.   Psychiatric/Behavioral: Negative.   All other systems reviewed and are negative.      Objective:   Physical Exam  Constitutional: She is oriented to person, place, and time. She appears well-developed and well-nourished.  Cardiovascular: Normal rate, regular rhythm and normal heart sounds.   Pulmonary/Chest: Effort normal and breath sounds normal.  Musculoskeletal:  Right ankle erythematous and tender to touch. FROM of right ankle with pain on eversion only.  Neurological: She is alert and oriented to person, place, and time.  Skin: Skin is warm.  Psychiatric: She has a normal mood and affect. Her behavior is normal. Judgment and thought content normal.   BP 180/75 mmHg  Pulse 59  Temp(Src) 97.2 F (36.2 C) (Oral)  Wt 158 lb 3.2 oz (71.759 kg)        Assessment & Plan:   1. Acute gout of right ankle, unspecified cause    Orders Placed This Encounter  Procedures  . Arthritis Panel   Colchicine was sent in yesterday- take as rx Ice  Elevate  RTO prn  Mary-Margaret Daphine Deutscher, FNP

## 2014-08-22 LAB — ARTHRITIS PANEL
BASOS ABS: 0 10*3/uL (ref 0.0–0.2)
Basos: 0 %
EOS (ABSOLUTE): 0.1 10*3/uL (ref 0.0–0.4)
Eos: 2 %
Hematocrit: 39.3 % (ref 34.0–46.6)
Hemoglobin: 13.1 g/dL (ref 11.1–15.9)
IMMATURE GRANULOCYTES: 0 %
Immature Grans (Abs): 0 10*3/uL (ref 0.0–0.1)
LYMPHS ABS: 2.4 10*3/uL (ref 0.7–3.1)
Lymphs: 35 %
MCH: 29.6 pg (ref 26.6–33.0)
MCHC: 33.3 g/dL (ref 31.5–35.7)
MCV: 89 fL (ref 79–97)
Monocytes Absolute: 0.3 10*3/uL (ref 0.1–0.9)
Monocytes: 5 %
NEUTROS PCT: 58 %
Neutrophils Absolute: 4 10*3/uL (ref 1.4–7.0)
PLATELETS: 318 10*3/uL (ref 150–379)
RBC: 4.43 x10E6/uL (ref 3.77–5.28)
RDW: 14.6 % (ref 12.3–15.4)
RHEUMATOID FACTOR: 12.4 [IU]/mL (ref 0.0–13.9)
SED RATE: 18 mm/h (ref 0–40)
URIC ACID: 4.4 mg/dL (ref 2.5–7.1)
WBC: 6.9 10*3/uL (ref 3.4–10.8)

## 2014-08-23 DIAGNOSIS — R350 Frequency of micturition: Secondary | ICD-10-CM | POA: Diagnosis not present

## 2014-08-23 DIAGNOSIS — N3946 Mixed incontinence: Secondary | ICD-10-CM | POA: Diagnosis not present

## 2014-08-27 ENCOUNTER — Other Ambulatory Visit: Payer: Self-pay | Admitting: General Practice

## 2014-09-15 DIAGNOSIS — N3946 Mixed incontinence: Secondary | ICD-10-CM | POA: Diagnosis not present

## 2014-09-15 DIAGNOSIS — R350 Frequency of micturition: Secondary | ICD-10-CM | POA: Diagnosis not present

## 2014-10-06 DIAGNOSIS — N3946 Mixed incontinence: Secondary | ICD-10-CM | POA: Diagnosis not present

## 2014-10-06 DIAGNOSIS — R350 Frequency of micturition: Secondary | ICD-10-CM | POA: Diagnosis not present

## 2014-10-13 ENCOUNTER — Other Ambulatory Visit (HOSPITAL_COMMUNITY): Payer: Self-pay

## 2014-10-20 ENCOUNTER — Ambulatory Visit (HOSPITAL_COMMUNITY): Payer: Medicare Other

## 2014-11-04 ENCOUNTER — Other Ambulatory Visit (HOSPITAL_COMMUNITY): Payer: Medicare Other

## 2014-11-05 ENCOUNTER — Other Ambulatory Visit: Payer: Self-pay | Admitting: General Practice

## 2014-11-05 ENCOUNTER — Other Ambulatory Visit: Payer: Self-pay | Admitting: Family

## 2014-11-05 NOTE — Telephone Encounter (Signed)
Last seen 08/21/14 MMM

## 2014-11-08 ENCOUNTER — Ambulatory Visit (HOSPITAL_COMMUNITY): Payer: Medicare Other | Attending: Internal Medicine

## 2014-11-08 ENCOUNTER — Other Ambulatory Visit: Payer: Self-pay | Admitting: Family

## 2014-11-08 ENCOUNTER — Other Ambulatory Visit: Payer: Self-pay

## 2014-11-08 DIAGNOSIS — I10 Essential (primary) hypertension: Secondary | ICD-10-CM

## 2014-11-08 DIAGNOSIS — I340 Nonrheumatic mitral (valve) insufficiency: Secondary | ICD-10-CM | POA: Insufficient documentation

## 2014-11-08 DIAGNOSIS — I429 Cardiomyopathy, unspecified: Secondary | ICD-10-CM | POA: Diagnosis not present

## 2014-11-08 DIAGNOSIS — I351 Nonrheumatic aortic (valve) insufficiency: Secondary | ICD-10-CM | POA: Insufficient documentation

## 2014-11-08 DIAGNOSIS — I358 Other nonrheumatic aortic valve disorders: Secondary | ICD-10-CM | POA: Diagnosis not present

## 2014-11-18 ENCOUNTER — Telehealth: Payer: Self-pay | Admitting: Cardiology

## 2014-11-18 NOTE — Telephone Encounter (Signed)
Spoke with pt, she wanted to find out information as to when is her next appt. Told her it's not until April 2017

## 2014-11-18 NOTE — Telephone Encounter (Signed)
[name] is returning a call . Please call   Thanks

## 2014-11-27 ENCOUNTER — Other Ambulatory Visit: Payer: Self-pay | Admitting: Family Medicine

## 2014-12-09 DIAGNOSIS — R350 Frequency of micturition: Secondary | ICD-10-CM | POA: Diagnosis not present

## 2014-12-09 DIAGNOSIS — N3946 Mixed incontinence: Secondary | ICD-10-CM | POA: Diagnosis not present

## 2014-12-10 ENCOUNTER — Ambulatory Visit (INDEPENDENT_AMBULATORY_CARE_PROVIDER_SITE_OTHER): Payer: Medicare Other | Admitting: Nurse Practitioner

## 2014-12-10 ENCOUNTER — Encounter: Payer: Self-pay | Admitting: Nurse Practitioner

## 2014-12-10 VITALS — BP 136/88 | HR 86 | Temp 97.9°F | Ht 62.0 in | Wt 158.0 lb

## 2014-12-10 DIAGNOSIS — Z6828 Body mass index (BMI) 28.0-28.9, adult: Secondary | ICD-10-CM

## 2014-12-10 DIAGNOSIS — K219 Gastro-esophageal reflux disease without esophagitis: Secondary | ICD-10-CM | POA: Diagnosis not present

## 2014-12-10 DIAGNOSIS — E785 Hyperlipidemia, unspecified: Secondary | ICD-10-CM

## 2014-12-10 DIAGNOSIS — M8580 Other specified disorders of bone density and structure, unspecified site: Secondary | ICD-10-CM | POA: Diagnosis not present

## 2014-12-10 DIAGNOSIS — I10 Essential (primary) hypertension: Secondary | ICD-10-CM

## 2014-12-10 DIAGNOSIS — E559 Vitamin D deficiency, unspecified: Secondary | ICD-10-CM

## 2014-12-10 DIAGNOSIS — N3281 Overactive bladder: Secondary | ICD-10-CM | POA: Diagnosis not present

## 2014-12-10 DIAGNOSIS — F411 Generalized anxiety disorder: Secondary | ICD-10-CM

## 2014-12-10 DIAGNOSIS — G4700 Insomnia, unspecified: Secondary | ICD-10-CM | POA: Diagnosis not present

## 2014-12-10 DIAGNOSIS — F329 Major depressive disorder, single episode, unspecified: Secondary | ICD-10-CM

## 2014-12-10 DIAGNOSIS — I429 Cardiomyopathy, unspecified: Secondary | ICD-10-CM

## 2014-12-10 DIAGNOSIS — Z87891 Personal history of nicotine dependence: Secondary | ICD-10-CM

## 2014-12-10 DIAGNOSIS — M810 Age-related osteoporosis without current pathological fracture: Secondary | ICD-10-CM

## 2014-12-10 DIAGNOSIS — F32A Depression, unspecified: Secondary | ICD-10-CM

## 2014-12-10 DIAGNOSIS — J45909 Unspecified asthma, uncomplicated: Secondary | ICD-10-CM

## 2014-12-10 DIAGNOSIS — J309 Allergic rhinitis, unspecified: Secondary | ICD-10-CM

## 2014-12-10 DIAGNOSIS — R609 Edema, unspecified: Secondary | ICD-10-CM

## 2014-12-10 DIAGNOSIS — Z720 Tobacco use: Secondary | ICD-10-CM

## 2014-12-10 MED ORDER — SIMVASTATIN 10 MG PO TABS: ORAL_TABLET | ORAL | Status: DC

## 2014-12-10 MED ORDER — LOSARTAN POTASSIUM 50 MG PO TABS: ORAL_TABLET | ORAL | Status: DC

## 2014-12-10 MED ORDER — AMITRIPTYLINE HCL 25 MG PO TABS: 25.0000 mg | ORAL_TABLET | Freq: Every day | ORAL | Status: DC

## 2014-12-10 MED ORDER — BUDESONIDE-FORMOTEROL FUMARATE 160-4.5 MCG/ACT IN AERO: 2.0000 | INHALATION_SPRAY | Freq: Two times a day (BID) | RESPIRATORY_TRACT | Status: DC

## 2014-12-10 MED ORDER — ESOMEPRAZOLE MAGNESIUM 40 MG PO CPDR: 40.0000 mg | DELAYED_RELEASE_CAPSULE | Freq: Every day | ORAL | Status: DC

## 2014-12-10 MED ORDER — BUPROPION HCL ER (SR) 150 MG PO TB12: 150.0000 mg | ORAL_TABLET | Freq: Two times a day (BID) | ORAL | Status: DC

## 2014-12-10 MED ORDER — CETIRIZINE HCL 10 MG PO TABS: 10.0000 mg | ORAL_TABLET | Freq: Every day | ORAL | Status: DC

## 2014-12-10 MED ORDER — FLUTICASONE PROPIONATE 50 MCG/ACT NA SUSP: 2.0000 | Freq: Every day | NASAL | Status: DC

## 2014-12-10 MED ORDER — OXYBUTYNIN CHLORIDE ER 10 MG PO TB24: 10.0000 mg | ORAL_TABLET | Freq: Every day | ORAL | Status: DC

## 2014-12-10 MED ORDER — RALOXIFENE HCL 60 MG PO TABS: 60.0000 mg | ORAL_TABLET | Freq: Every day | ORAL | Status: DC

## 2014-12-10 MED ORDER — VITAMIN D (ERGOCALCIFEROL) 1.25 MG (50000 UNIT) PO CAPS: 50000.0000 [IU] | ORAL_CAPSULE | ORAL | Status: DC

## 2014-12-10 MED ORDER — FUROSEMIDE 20 MG PO TABS: 10.0000 mg | ORAL_TABLET | Freq: Every day | ORAL | Status: DC

## 2014-12-10 MED ORDER — ALPRAZOLAM 0.5 MG PO TABS: ORAL_TABLET | ORAL | Status: DC

## 2014-12-10 NOTE — Patient Instructions (Signed)
Gastroesophageal Reflux Disease, Adult Gastroesophageal reflux disease (GERD) happens when acid from your stomach flows up into the esophagus. When acid comes in contact with the esophagus, the acid causes soreness (inflammation) in the esophagus. Over time, GERD may create small holes (ulcers) in the lining of the esophagus. CAUSES   Increased body weight. This puts pressure on the stomach, making acid rise from the stomach into the esophagus.  Smoking. This increases acid production in the stomach.  Drinking alcohol. This causes decreased pressure in the lower esophageal sphincter (valve or ring of muscle between the esophagus and stomach), allowing acid from the stomach into the esophagus.  Late evening meals and a full stomach. This increases pressure and acid production in the stomach.  A malformed lower esophageal sphincter. Sometimes, no cause is found. SYMPTOMS   Burning pain in the lower part of the mid-chest behind the breastbone and in the mid-stomach area. This may occur twice a week or more often.  Trouble swallowing.  Sore throat.  Dry cough.  Asthma-like symptoms including chest tightness, shortness of breath, or wheezing. DIAGNOSIS  Your caregiver may be able to diagnose GERD based on your symptoms. In some cases, X-rays and other tests may be done to check for complications or to check the condition of your stomach and esophagus. TREATMENT  Your caregiver may recommend over-the-counter or prescription medicines to help decrease acid production. Ask your caregiver before starting or adding any new medicines.  HOME CARE INSTRUCTIONS   Change the factors that you can control. Ask your caregiver for guidance concerning weight loss, quitting smoking, and alcohol consumption.  Avoid foods and drinks that make your symptoms worse, such as:  Caffeine or alcoholic drinks.  Chocolate.  Peppermint or mint flavorings.  Garlic and onions.  Spicy foods.  Citrus fruits,  such as oranges, lemons, or limes.  Tomato-based foods such as sauce, chili, salsa, and pizza.  Fried and fatty foods.  Avoid lying down for the 3 hours prior to your bedtime or prior to taking a nap.  Eat small, frequent meals instead of large meals.  Wear loose-fitting clothing. Do not wear anything tight around your waist that causes pressure on your stomach.  Raise the head of your bed 6 to 8 inches with wood blocks to help you sleep. Extra pillows will not help.  Only take over-the-counter or prescription medicines for pain, discomfort, or fever as directed by your caregiver.  Do not take aspirin, ibuprofen, or other nonsteroidal anti-inflammatory drugs (NSAIDs). SEEK IMMEDIATE MEDICAL CARE IF:   You have pain in your arms, neck, jaw, teeth, or back.  Your pain increases or changes in intensity or duration.  You develop nausea, vomiting, or sweating (diaphoresis).  You develop shortness of breath, or you faint.  Your vomit is green, yellow, black, or looks like coffee grounds or blood.  Your stool is red, bloody, or black. These symptoms could be signs of other problems, such as heart disease, gastric bleeding, or esophageal bleeding. MAKE SURE YOU:   Understand these instructions.  Will watch your condition.  Will get help right away if you are not doing well or get worse. Document Released: 01/24/2005 Document Revised: 07/09/2011 Document Reviewed: 11/03/2010 Exit[employer] Patient Information 2015 [employer]. This information is not intended to replace advice given to you by your health care provider. Make sure you discuss any questions you have with your health care provider.  

## 2014-12-10 NOTE — Progress Notes (Signed)
Subjective:    Patient ID: [name], female    DOB: Dec 31, 1934, 80 y.o.   MRN: [id number]   Patient here today for follow up of chronic medical problems.   Hyperlipidemia This is a chronic problem. The current episode started more than 1 year ago. The problem is controlled. Recent lipid tests were reviewed and are normal. She has no history of diabetes or hypothyroidism. Factors aggravating her hyperlipidemia include smoking. Pertinent negatives include no leg pain, myalgias or shortness of breath. Current antihyperlipidemic treatment includes statins. The current treatment provides moderate improvement of lipids. Risk factors for coronary artery disease include dyslipidemia, hypertension and post-menopausal.  Hypertension This is a chronic problem. The current episode started more than 1 year ago. The problem has been resolved since onset. The problem is controlled. Associated symptoms include anxiety and peripheral edema ("some times in my left foot'). Pertinent negatives include no headaches, palpitations or shortness of breath. Risk factors for coronary artery disease include dyslipidemia, obesity and post-menopausal state. Past treatments include beta blockers and angiotensin blockers. The current treatment provides significant improvement. There is no history of kidney disease, CAD/MI, heart failure or a thyroid problem. There is no history of sleep apnea.  Gastrophageal Reflux She reports no belching, no coughing, no heartburn or no sore throat. This is a chronic problem. The current episode started more than 1 year ago. The problem occurs rarely. The problem has been resolved. The symptoms are aggravated by lying down. She has tried a PPI (was on nexiunm and was changed to zantac and that is not helping.) for the symptoms. The treatment provided significant relief.  Anxiety Presents for follow-up visit. Symptoms include depressed mood. Patient reports no excessive worry, irritability,  nervous/anxious behavior, palpitations or shortness of breath. Symptoms occur rarely.   Her past medical history is significant for anxiety/panic attacks and depression. Past treatments include SSRIs and benzodiazephines.  insomnia Amitriptyline works well to help her rest at night. no side effects Peripheral edema Lasix helps keep swelling down- has occasional swelling. Over active bladder Ditropan working well. occasional nocturia but for the most part dooing well. B12 def Oral B12 Def vitamin d Vitamin d 50000u daily- no side effects depression wellbutrin is working great for her- helps her from feeling anxious nad down. osteopenia evista daily- some weight bearing exercise when it is not so hot outside. cardiomyopathy Coreg- sees [facility] every 6 months- last check up was good- no fatigue or chest pain. COPD Was a smoker for many years- quit 6 months ago- uses symbicort daily and is doing well- no SOB unless she gets really hot. Allergic rhinitis Currently on zyrtec and flonase- keeping allergies under control well- has occasional dry cough  But otherwise doing well.      Review of Systems  Constitutional: Negative.  Negative for irritability.  HENT: Negative.  Negative for sore throat.   Eyes: Negative.   Respiratory: Negative.  Negative for cough and shortness of breath.   Cardiovascular: Negative.  Negative for palpitations.  Gastrointestinal: Negative.  Negative for heartburn.  Endocrine: Negative.   Genitourinary: Negative.   Musculoskeletal: Negative.  Negative for myalgias.  Neurological: Negative.  Negative for headaches.  Hematological: Negative.   Psychiatric/Behavioral: Negative.  The patient is not nervous/anxious.   All other systems reviewed and are negative.      Objective:   Physical Exam  Constitutional: She is oriented to person, place, and time. She appears well-developed and well-nourished. No distress.  HENT:  Head:  Normocephalic and  atraumatic.  Right Ear: External ear normal.  Left Ear: External ear normal.  Nose: Nose normal.  Mouth/Throat: Oropharynx is clear and moist.  Eyes: Pupils are equal, round, and reactive to light.  Neck: Normal range of motion. Neck supple. No thyromegaly present.  Cardiovascular: Normal rate, regular rhythm, normal heart sounds and intact distal pulses.   No murmur heard. Pulmonary/Chest: Effort normal and breath sounds normal. No respiratory distress. She has no wheezes.  Abdominal: Soft. Bowel sounds are normal. She exhibits no distension. There is no tenderness.  Musculoskeletal: Normal range of motion. She exhibits no edema or tenderness.  Neurological: She is alert and oriented to person, place, and time. She has normal reflexes. No cranial nerve deficit.  Skin: Skin is warm and dry.  Psychiatric: She has a normal mood and affect. Her behavior is normal. Judgment and thought content normal.  Vitals reviewed.   BP 136/88 mmHg  Pulse 86  Temp(Src) 97.9 F (36.6 C) (Oral)  Ht 5' 2"  (1.575 m)  Wt 158 lb (71.668 kg)  BMI 28.89 kg/m2        Assessment & Plan:  1. Essential hypertension Do not add salt to diet - CMP14+EGFR - losartan (COZAAR) 50 MG tablet; TAKE 1 TABLET (50 MG TOTAL) BY MOUTH DAILY.  Dispense: 90 tablet; Refill: 1  2. Secondary cardiomyopathy Keep folow up with cardiology  3. Gastroesophageal reflux disease without esophagitis Avoid spicy foods Do not eat 2 hours prior to bedtime - esomeprazole (NEXIUM) 40 MG capsule; Take 1 capsule (40 mg total) by mouth daily before breakfast.  Dispense: 90 capsule; Refill: 1  4. Overactive bladder  5. Vitamin D deficiency - Vitamin D, Ergocalciferol, (DRISDOL) 50000 UNITS CAPS capsule; Take 1 capsule (50,000 Units total) by mouth once a week.  Dispense: 12 capsule; Refill: 6 - Vit D  25 hydroxy (rtn osteoporosis monitoring)  6. Hyperlipidemia Low fta diet - Lipid panel - simvastatin (ZOCOR) 10 MG tablet; TAKE 1  TABLET (10 MG TOTAL) BY MOUTH AT BEDTIME.  Dispense: 90 tablet; Refill: 1  7. GAD (generalized anxiety disorder) Stress management - ALPRAZolam (XANAX) 0.5 MG tablet; TAKE ONE TABLET BY MOUTH DAILY AS NEEDED FOR ANXIETY  Dispense: 30 tablet; Refill: 1  8. Depression  9. Allergic rhinitis, unspecified allergic rhinitis type - cetirizine (ZYRTEC) 10 MG tablet; Take 1 tablet (10 mg total) by mouth daily.  Dispense: 30 tablet; Refill: 5 - fluticasone (FLONASE) 50 MCG/ACT nasal spray; Place 2 sprays into both nostrils daily.  Dispense: 16 g; Refill: 2  10 Insomnia Bedtime ritual - oxybutynin (DITROPAN-XL) 10 MG 24 hr tablet; Take 1 tablet (10 mg total) by mouth daily.  Dispense: 30 tablet; Refill: 5  11 BMI 28.0-28.9,adult Discussed diet and exercise for person with BMI >25 Will recheck weight in 3-6 months   12 Stopped smoking - buPROPion (WELLBUTRIN SR) 150 MG 12 hr tablet; Take 1 tablet (150 mg total) by mouth 2 (two) times daily.  Dispense: 180 tablet; Refill: 1  13 Osteoporosis Weight bearing exercises - raloxifene (EVISTA) 60 MG tablet; Take 1 tablet (60 mg total) by mouth daily.  Dispense: 90 tablet; Refill: 1   14 Asthma, chronic, unspecified asthma severity, uncomplicated Patient told to use symbicort daily =not as rescue inhaler - budesonide-formoterol (SYMBICORT) 160-4.5 MCG/ACT inhaler; Inhale 2 puffs into the lungs 2 (two) times daily.  Dispense: 1 Inhaler; Refill: 3  15 Peripheral edema Elevate legs when sitting - furosemide (LASIX) 20 MG tablet; Take 0.5  tablets (10 mg total) by mouth daily.  Dispense: 30 tablet; Refill: 5    Labs pending Health maintenance reviewed Diet and exercise encouraged Continue all meds Follow up  In 3 months  [place], FNP

## 2014-12-11 LAB — CMP14+EGFR
ALBUMIN: 4.3 g/dL (ref 3.5–4.8)
ALT: 16 IU/L (ref 0–32)
AST: 20 IU/L (ref 0–40)
Albumin/Globulin Ratio: 2 (ref 1.1–2.5)
Alkaline Phosphatase: 111 IU/L (ref 39–117)
BUN/Creatinine Ratio: 13 (ref 11–26)
BUN: 15 mg/dL (ref 8–27)
Bilirubin Total: <0.2 mg/dL (ref 0.0–1.2)
CALCIUM: 8.9 mg/dL (ref 8.7–10.3)
CO2: 25 mmol/L (ref 18–29)
CREATININE: 1.18 mg/dL — AB (ref 0.57–1.00)
Chloride: 105 mmol/L (ref 97–108)
GFR calc non Af Amer: 45 mL/min/{1.73_m2} — ABNORMAL LOW (ref >59)
GFR, EST AFRICAN AMERICAN: 51 mL/min/{1.73_m2} — AB (ref >59)
GLOBULIN, TOTAL: 2.1 g/dL (ref 1.5–4.5)
Glucose: 86 mg/dL (ref 65–99)
POTASSIUM: 3.5 mmol/L (ref 3.5–5.2)
Sodium: 145 mmol/L — ABNORMAL HIGH (ref 134–144)
Total Protein: 6.4 g/dL (ref 6.0–8.5)

## 2014-12-11 LAB — LIPID PANEL
Chol/HDL Ratio: 2.5 ratio (ref 0.0–4.4)
Cholesterol, Total: 197 mg/dL (ref 100–199)
HDL: 78 mg/dL
LDL Calculated: 100 mg/dL — ABNORMAL HIGH (ref 0–99)
Triglycerides: 93 mg/dL (ref 0–149)
VLDL Cholesterol Cal: 19 mg/dL (ref 5–40)

## 2014-12-11 LAB — VITAMIN D 25 HYDROXY (VIT D DEFICIENCY, FRACTURES): Vit D, 25-Hydroxy: 19.8 ng/mL — ABNORMAL LOW (ref 30.0–100.0)

## 2014-12-14 ENCOUNTER — Telehealth: Payer: Self-pay | Admitting: Nurse Practitioner

## 2014-12-14 NOTE — Telephone Encounter (Signed)
It is ok to take NSAIDS occasionally but just not everyday.

## 2014-12-14 NOTE — Telephone Encounter (Signed)
Patient wants to know what she can take for pain since she is not able to take any NSAIDs due to kidney functions. Please advise

## 2014-12-19 ENCOUNTER — Other Ambulatory Visit: Payer: Self-pay | Admitting: Nurse Practitioner

## 2014-12-20 ENCOUNTER — Other Ambulatory Visit: Payer: Self-pay

## 2014-12-20 MED ORDER — BENZONATATE 100 MG PO CAPS: 100.0000 mg | ORAL_CAPSULE | Freq: Two times a day (BID) | ORAL | Status: DC | PRN

## 2014-12-20 NOTE — Telephone Encounter (Signed)
Last seen 12/10/14 MMM

## 2014-12-21 DIAGNOSIS — H5201 Hypermetropia, right eye: Secondary | ICD-10-CM | POA: Diagnosis not present

## 2014-12-21 DIAGNOSIS — Z961 Presence of intraocular lens: Secondary | ICD-10-CM | POA: Diagnosis not present

## 2014-12-21 DIAGNOSIS — H5212 Myopia, left eye: Secondary | ICD-10-CM | POA: Diagnosis not present

## 2014-12-21 DIAGNOSIS — H52223 Regular astigmatism, bilateral: Secondary | ICD-10-CM | POA: Diagnosis not present

## 2014-12-30 DIAGNOSIS — N3941 Urge incontinence: Secondary | ICD-10-CM | POA: Diagnosis not present

## 2015-01-04 DIAGNOSIS — M7062 Trochanteric bursitis, left hip: Secondary | ICD-10-CM | POA: Diagnosis not present

## 2015-01-06 DIAGNOSIS — N3941 Urge incontinence: Secondary | ICD-10-CM | POA: Diagnosis not present

## 2015-01-13 DIAGNOSIS — N3941 Urge incontinence: Secondary | ICD-10-CM | POA: Diagnosis not present

## 2015-01-17 ENCOUNTER — Ambulatory Visit: Payer: Medicare Other | Admitting: Physical Therapy

## 2015-01-17 DIAGNOSIS — Z1231 Encounter for screening mammogram for malignant neoplasm of breast: Secondary | ICD-10-CM | POA: Diagnosis not present

## 2015-01-17 NOTE — Therapy (Signed)
[facility] [address] [place], [place], [address] Phone: [phone]   Fax:  [phone]  Patient Details  Name: JARIKA ROBBEN MRN: [id number] Date of Birth: 12/25/1934 Referring Provider:  Gean Birchwood, MD  Encounter Date: 01/17/2015   Cancelling treatment today.  Patient said she went to the doctor for left hip pain but the order stated right hip.  Our office is calling for a left hip order and patient said she will call her doctor's office as well. [place], [place] MPT 01/17/2015, 9:35 AM  [facility] [address] [place], [place], [address] Phone: [phone]   Fax:  [phone]

## 2015-01-18 ENCOUNTER — Other Ambulatory Visit: Payer: Medicare Other | Admitting: Nurse Practitioner

## 2015-01-19 ENCOUNTER — Encounter: Payer: Self-pay | Admitting: Family

## 2015-01-20 DIAGNOSIS — N3941 Urge incontinence: Secondary | ICD-10-CM | POA: Diagnosis not present

## 2015-01-27 DIAGNOSIS — N3941 Urge incontinence: Secondary | ICD-10-CM | POA: Diagnosis not present

## 2015-02-03 ENCOUNTER — Encounter: Payer: Self-pay | Admitting: Family Medicine

## 2015-02-04 ENCOUNTER — Telehealth: Payer: Self-pay | Admitting: Nurse Practitioner

## 2015-02-04 NOTE — Telephone Encounter (Signed)
Pt given appt with MMM 10/12 at 3:45 for shoulder pain due to her arthritis.

## 2015-02-09 ENCOUNTER — Encounter: Payer: Self-pay | Admitting: Family Medicine

## 2015-02-09 ENCOUNTER — Ambulatory Visit: Payer: Medicare Other | Admitting: Nurse Practitioner

## 2015-02-09 ENCOUNTER — Ambulatory Visit (INDEPENDENT_AMBULATORY_CARE_PROVIDER_SITE_OTHER): Payer: Medicare Other | Admitting: Family Medicine

## 2015-02-09 ENCOUNTER — Other Ambulatory Visit: Payer: Self-pay | Admitting: Nurse Practitioner

## 2015-02-09 VITALS — BP 136/78 | HR 90 | Temp 98.1°F | Ht 62.0 in | Wt 157.8 lb

## 2015-02-09 DIAGNOSIS — M7502 Adhesive capsulitis of left shoulder: Secondary | ICD-10-CM | POA: Diagnosis not present

## 2015-02-09 DIAGNOSIS — J309 Allergic rhinitis, unspecified: Secondary | ICD-10-CM | POA: Diagnosis not present

## 2015-02-09 MED ORDER — CETIRIZINE HCL 10 MG PO TABS: 10.0000 mg | ORAL_TABLET | Freq: Every day | ORAL | Status: DC

## 2015-02-09 MED ORDER — FLUTICASONE PROPIONATE 50 MCG/ACT NA SUSP: 2.0000 | Freq: Every day | NASAL | Status: DC

## 2015-02-09 MED ORDER — IBUPROFEN 800 MG PO TABS: 800.0000 mg | ORAL_TABLET | Freq: Three times a day (TID) | ORAL | Status: DC | PRN

## 2015-02-09 NOTE — Assessment & Plan Note (Signed)
Patient appears to have recurrent allergic rhinitis about this time year. We will treat with Flonase and Zyrtec.

## 2015-02-09 NOTE — Progress Notes (Signed)
BP 136/78 mmHg  Pulse 90  Temp(Src) 98.1 F (36.7 C) (Oral)  Ht 5' 2"  (1.575 m)  Wt 157 lb 12.8 oz (71.578 kg)  BMI 28.85 kg/m2   Subjective:    Patient ID: [name], female    DOB: [birth year], 80 y.o.   MRN: [id number]  HPI: [name] is a 80 y.o. female presenting on 02/09/2015 for Shoulder Pain and Sinusitis   HPI Sinus pressure Patient is having a lot of sinus pressure and nasal congestion for the past week we can half. She denies any fevers or chills, sore throat, fevers, chills, cough. She has had occasional sneezing. The pressure is mostly in the maxillary region under her eyes.  Left shoulder pain Patient is been having left shoulder pain for the past 2-3 weeks. It is not hurt when she has a dependent but mainly when she tries to lift it up over her head. She cannot lift it past about 90 abduction. She has had her other shoulder replaced previously but has not had any major problems with this until recently. She has been using Advil and Biofreeze which is been helping some but not greatly. She denies any tingling or numbness, neck pain, pain shooting anywhere else.  Relevant past medical, surgical, family and social history reviewed and updated as indicated. Interim medical history since our last visit reviewed. Allergies and medications reviewed and updated.  Review of Systems  Constitutional: Negative for fever and chills.  HENT: Positive for congestion, sinus pressure and sneezing. Negative for ear discharge, ear pain, postnasal drip, rhinorrhea and sore throat.   Eyes: Negative for pain, redness and visual disturbance.  Respiratory: Negative for chest tightness and shortness of breath.   Cardiovascular: Negative for chest pain and leg swelling.  Genitourinary: Negative for dysuria and difficulty urinating.  Musculoskeletal: Positive for arthralgias. Negative for back pain, joint swelling, gait problem, neck pain and neck stiffness.  Skin: Negative for  rash.  Neurological: Negative for dizziness, light-headedness and headaches.  Psychiatric/Behavioral: Negative for behavioral problems and agitation.  All other systems reviewed and are negative.   Per HPI unless specifically indicated above     Medication List       This list is accurate as of: 02/09/15  6:31 PM.  Always use your most recent med list.               ALPRAZolam 0.5 MG tablet  Commonly known as:  XANAX  TAKE ONE TABLET BY MOUTH DAILY AS NEEDED FOR ANXIETY     amitriptyline 25 MG tablet  Commonly known as:  ELAVIL  Take 1 tablet (25 mg total) by mouth at bedtime.     aspirin EC 81 MG tablet  Take 81 mg by mouth daily.     Biotin 5000 MCG Caps  Take 1 capsule by mouth daily.     budesonide-formoterol 160-4.5 MCG/ACT inhaler  Commonly known as:  SYMBICORT  Inhale 2 puffs into the lungs 2 (two) times daily.     buPROPion 150 MG 12 hr tablet  Commonly known as:  WELLBUTRIN SR  Take 1 tablet (150 mg total) by mouth 2 (two) times daily.     calcium citrate-vitamin D 315-200 MG-UNIT tablet  Commonly known as:  CITRACAL+D  Take 1 tablet by mouth daily.     carvedilol 6.25 MG tablet  Commonly known as:  COREG  Take 6.25 mg by mouth 2 (two) times daily with a meal.     cetirizine 10  MG tablet  Commonly known as:  ZYRTEC  Take 1 tablet (10 mg total) by mouth daily.     colchicine 0.6 MG tablet  Take 1 tablet (0.6 mg total) by mouth daily.     esomeprazole 40 MG capsule  Commonly known as:  NEXIUM  Take 1 capsule (40 mg total) by mouth daily before breakfast.     fluticasone 50 MCG/ACT nasal spray  Commonly known as:  FLONASE  Place 2 sprays into both nostrils daily.     furosemide 20 MG tablet  Commonly known as:  LASIX  Take 0.5 tablets (10 mg total) by mouth daily.     ibuprofen 800 MG tablet  Commonly known as:  ADVIL,MOTRIN  Take 1 tablet (800 mg total) by mouth every 8 (eight) hours as needed.     losartan 50 MG tablet  Commonly known  as:  COZAAR  TAKE 1 TABLET (50 MG TOTAL) BY MOUTH DAILY.     ondansetron 4 MG tablet  Commonly known as:  ZOFRAN  TAKE 1 TABLET (4 MG TOTAL) BY MOUTH EVERY 12 (TWELVE) HOURS AS NEEDED FOR NAUSEA.     oxybutynin 10 MG 24 hr tablet  Commonly known as:  DITROPAN-XL  Take 1 tablet (10 mg total) by mouth daily.     raloxifene 60 MG tablet  Commonly known as:  EVISTA  Take 1 tablet (60 mg total) by mouth daily.     simvastatin 10 MG tablet  Commonly known as:  ZOCOR  TAKE 1 TABLET (10 MG TOTAL) BY MOUTH AT BEDTIME.     vitamin B-12 1000 MCG tablet  Commonly known as:  CYANOCOBALAMIN  Take 500 mcg by mouth daily.     Vitamin D (Ergocalciferol) 50000 UNITS Caps capsule  Commonly known as:  DRISDOL  Take 1 capsule (50,000 Units total) by mouth once a week.           Objective:    BP 136/78 mmHg  Pulse 90  Temp(Src) 98.1 F (36.7 C) (Oral)  Ht 5' 2"  (1.575 m)  Wt 157 lb 12.8 oz (71.578 kg)  BMI 28.85 kg/m2  Wt Readings from Last 3 Encounters:  02/09/15 157 lb 12.8 oz (71.578 kg)  12/10/14 158 lb (71.668 kg)  08/21/14 158 lb 3.2 oz (71.759 kg)    Physical Exam  Constitutional: She is oriented to person, place, and time. She appears well-developed and well-nourished. No distress.  HENT:  Right Ear: Tympanic membrane, external ear and ear canal normal.  Left Ear: Tympanic membrane, external ear and ear canal normal.  Nose: Mucosal edema present. No rhinorrhea. No epistaxis. Right sinus exhibits maxillary sinus tenderness. Right sinus exhibits no frontal sinus tenderness. Left sinus exhibits maxillary sinus tenderness. Left sinus exhibits no frontal sinus tenderness.  Mouth/Throat: Uvula is midline, oropharynx is clear and moist and mucous membranes are normal.  Eyes: Conjunctivae and EOM are normal. Pupils are equal, round, and reactive to light.  Cardiovascular: Normal rate, regular rhythm, normal heart sounds and intact distal pulses.   No murmur heard. Pulmonary/Chest:  Effort normal and breath sounds normal. No respiratory distress. She has no wheezes.  Musculoskeletal: Normal range of motion. She exhibits no edema or tenderness.  Neurological: She is alert and oriented to person, place, and time. Coordination normal.  Skin: Skin is warm and dry. No rash noted. She is not diaphoretic.  Psychiatric: She has a normal mood and affect. Her behavior is normal.  Vitals reviewed.   Results for orders placed  or performed in visit on 12/10/14  CMP14+EGFR  Result Value Ref Range   Glucose 86 65 - 99 mg/dL   BUN 15 8 - 27 mg/dL   Creatinine, Ser 1.18 (H) 0.57 - 1.00 mg/dL   GFR calc non Af Amer 45 (L) >59 mL/min/1.73   GFR calc Af Amer 51 (L) >59 mL/min/1.73   BUN/Creatinine Ratio 13 11 - 26   Sodium 145 (H) 134 - 144 mmol/L   Potassium 3.5 3.5 - 5.2 mmol/L   Chloride 105 97 - 108 mmol/L   CO2 25 18 - 29 mmol/L   Calcium 8.9 8.7 - 10.3 mg/dL   Total Protein 6.4 6.0 - 8.5 g/dL   Albumin 4.3 3.5 - 4.8 g/dL   Globulin, Total 2.1 1.5 - 4.5 g/dL   Albumin/Globulin Ratio 2.0 1.1 - 2.5   Bilirubin Total <0.2 0.0 - 1.2 mg/dL   Alkaline Phosphatase 111 39 - 117 IU/L   AST 20 0 - 40 IU/L   ALT 16 0 - 32 IU/L  Lipid panel  Result Value Ref Range   Cholesterol, Total 197 100 - 199 mg/dL   Triglycerides 93 0 - 149 mg/dL   HDL 78 >39 mg/dL   VLDL Cholesterol Cal 19 5 - 40 mg/dL   LDL Calculated 100 (H) 0 - 99 mg/dL   Chol/HDL Ratio 2.5 0.0 - 4.4 ratio units  Vit D  25 hydroxy (rtn osteoporosis monitoring)  Result Value Ref Range   Vit D, 25-Hydroxy 19.8 (L) 30.0 - 100.0 ng/mL      Assessment & Plan:   Problem List Items Addressed This Visit      Respiratory   Allergic rhinitis - Primary    Patient appears to have recurrent allergic rhinitis about this time year. We will treat with Flonase and Zyrtec.      Relevant Medications   cetirizine (ZYRTEC) 10 MG tablet   fluticasone (FLONASE) 50 MCG/ACT nasal spray    Other Visit Diagnoses    Frozen  shoulder, left        Physical exam appears to show frozen shoulder on the left side, we will refer to orthopedic surgery. Take anti-inflammatories and ibuprofen for now    Relevant Medications    ibuprofen (ADVIL,MOTRIN) 800 MG tablet    Other Relevant Orders    Ambulatory referral to Orthopedic Surgery        Follow up plan: Return if symptoms worsen or fail to improve.  Caryl Pina, MD [facility] 02/09/2015, 6:31 PM

## 2015-02-17 DIAGNOSIS — R350 Frequency of micturition: Secondary | ICD-10-CM | POA: Diagnosis not present

## 2015-02-17 DIAGNOSIS — N3941 Urge incontinence: Secondary | ICD-10-CM | POA: Diagnosis not present

## 2015-02-17 DIAGNOSIS — N3946 Mixed incontinence: Secondary | ICD-10-CM | POA: Diagnosis not present

## 2015-02-18 ENCOUNTER — Ambulatory Visit: Payer: Self-pay

## 2015-02-21 ENCOUNTER — Ambulatory Visit (HOSPITAL_COMMUNITY): Admission: EM | Admit: 2015-02-21 | Payer: Medicare Other | Admitting: Cardiology

## 2015-02-21 ENCOUNTER — Emergency Department (HOSPITAL_COMMUNITY): Payer: Medicare Other

## 2015-02-21 ENCOUNTER — Emergency Department (HOSPITAL_COMMUNITY)
Admission: EM | Admit: 2015-02-21 | Discharge: 2015-02-22 | Disposition: A | Discharge status: Home / Self Care | Payer: Medicare Other | Attending: Emergency Medicine | Admitting: Emergency Medicine

## 2015-02-21 ENCOUNTER — Encounter (HOSPITAL_COMMUNITY): Payer: Self-pay

## 2015-02-21 ENCOUNTER — Telehealth: Payer: Self-pay | Admitting: Family Medicine

## 2015-02-21 ENCOUNTER — Encounter (HOSPITAL_COMMUNITY): Admission: EM | Payer: Self-pay

## 2015-02-21 ENCOUNTER — Encounter: Payer: Self-pay | Admitting: Pediatrics

## 2015-02-21 ENCOUNTER — Ambulatory Visit (INDEPENDENT_AMBULATORY_CARE_PROVIDER_SITE_OTHER): Payer: Medicare Other | Admitting: Pediatrics

## 2015-02-21 VITALS — BP 144/82 | HR 75

## 2015-02-21 DIAGNOSIS — E782 Mixed hyperlipidemia: Secondary | ICD-10-CM | POA: Insufficient documentation

## 2015-02-21 DIAGNOSIS — J90 Pleural effusion, not elsewhere classified: Secondary | ICD-10-CM | POA: Diagnosis not present

## 2015-02-21 DIAGNOSIS — Z8669 Personal history of other diseases of the nervous system and sense organs: Secondary | ICD-10-CM | POA: Insufficient documentation

## 2015-02-21 DIAGNOSIS — Z720 Tobacco use: Secondary | ICD-10-CM | POA: Diagnosis not present

## 2015-02-21 DIAGNOSIS — Z880 Allergy status to penicillin: Secondary | ICD-10-CM | POA: Diagnosis not present

## 2015-02-21 DIAGNOSIS — I10 Essential (primary) hypertension: Secondary | ICD-10-CM | POA: Diagnosis not present

## 2015-02-21 DIAGNOSIS — M818 Other osteoporosis without current pathological fracture: Secondary | ICD-10-CM | POA: Insufficient documentation

## 2015-02-21 DIAGNOSIS — F329 Major depressive disorder, single episode, unspecified: Secondary | ICD-10-CM | POA: Diagnosis not present

## 2015-02-21 DIAGNOSIS — R079 Chest pain, unspecified: Secondary | ICD-10-CM

## 2015-02-21 DIAGNOSIS — K219 Gastro-esophageal reflux disease without esophagitis: Secondary | ICD-10-CM | POA: Insufficient documentation

## 2015-02-21 DIAGNOSIS — Z9889 Other specified postprocedural states: Secondary | ICD-10-CM | POA: Insufficient documentation

## 2015-02-21 DIAGNOSIS — Z79899 Other long term (current) drug therapy: Secondary | ICD-10-CM | POA: Diagnosis not present

## 2015-02-21 DIAGNOSIS — R071 Chest pain on breathing: Secondary | ICD-10-CM

## 2015-02-21 DIAGNOSIS — M1990 Unspecified osteoarthritis, unspecified site: Secondary | ICD-10-CM | POA: Insufficient documentation

## 2015-02-21 DIAGNOSIS — Z7982 Long term (current) use of aspirin: Secondary | ICD-10-CM | POA: Insufficient documentation

## 2015-02-21 DIAGNOSIS — R0602 Shortness of breath: Secondary | ICD-10-CM | POA: Diagnosis not present

## 2015-02-21 DIAGNOSIS — F419 Anxiety disorder, unspecified: Secondary | ICD-10-CM | POA: Insufficient documentation

## 2015-02-21 DIAGNOSIS — R110 Nausea: Secondary | ICD-10-CM | POA: Diagnosis not present

## 2015-02-21 LAB — BASIC METABOLIC PANEL
Anion gap: 9 (ref 5–15)
BUN: 8 mg/dL (ref 6–20)
CO2: 25 mmol/L (ref 22–32)
Calcium: 8.8 mg/dL — ABNORMAL LOW (ref 8.9–10.3)
Chloride: 107 mmol/L (ref 101–111)
Creatinine, Ser: 0.95 mg/dL (ref 0.44–1.00)
GFR calc Af Amer: >60 mL/min (ref >60)
GFR, EST NON AFRICAN AMERICAN: 56 mL/min — AB (ref >60)
GLUCOSE: 86 mg/dL (ref 65–99)
POTASSIUM: 3.3 mmol/L — AB (ref 3.5–5.1)
Sodium: 141 mmol/L (ref 135–145)

## 2015-02-21 LAB — CBC WITH DIFFERENTIAL/PLATELET
Basophils Absolute: 0 10*3/uL (ref 0.0–0.1)
Basophils Relative: 0 %
EOS PCT: 2 %
Eosinophils Absolute: 0.2 10*3/uL (ref 0.0–0.7)
HCT: 38.1 % (ref 36.0–46.0)
Hemoglobin: 13.3 g/dL (ref 12.0–15.0)
LYMPHS ABS: 2.2 10*3/uL (ref 0.7–4.0)
LYMPHS PCT: 23 %
MCH: 30.4 pg (ref 26.0–34.0)
MCHC: 34.9 g/dL (ref 30.0–36.0)
MCV: 87 fL (ref 78.0–100.0)
MONO ABS: 0.5 10*3/uL (ref 0.1–1.0)
Monocytes Relative: 5 %
Neutro Abs: 6.7 10*3/uL (ref 1.7–7.7)
Neutrophils Relative %: 70 %
PLATELETS: 283 10*3/uL (ref 150–400)
RBC: 4.38 MIL/uL (ref 3.87–5.11)
RDW: 13.6 % (ref 11.5–15.5)
WBC: 9.6 10*3/uL (ref 4.0–10.5)

## 2015-02-21 LAB — I-STAT TROPONIN, ED
TROPONIN I, POC: 0 ng/mL (ref 0.00–0.08)
Troponin i, poc: 0 ng/mL (ref 0.00–0.08)

## 2015-02-21 LAB — BRAIN NATRIURETIC PEPTIDE: B Natriuretic Peptide: 166.2 pg/mL — ABNORMAL HIGH (ref 0.0–100.0)

## 2015-02-21 SURGERY — LEFT HEART CATH AND CORONARY ANGIOGRAPHY

## 2015-02-21 MED ORDER — ASPIRIN 81 MG PO CHEW
324.0000 mg | CHEWABLE_TABLET | Freq: Once | ORAL | Status: AC
  Administered 2015-02-21: 324 mg via ORAL

## 2015-02-21 MED ORDER — NITROGLYCERIN 0.4 MG SL SUBL
0.4000 mg | SUBLINGUAL_TABLET | Freq: Once | SUBLINGUAL | Status: AC
  Administered 2015-02-21: 0.4 mg via SUBLINGUAL

## 2015-02-21 MED ORDER — GI COCKTAIL ~~LOC~~
30.0000 mL | Freq: Once | ORAL | Status: AC
  Administered 2015-02-21: 30 mL via ORAL
  Filled 2015-02-21: qty 30

## 2015-02-21 MED ORDER — IOHEXOL 350 MG/ML SOLN
80.0000 mL | Freq: Once | INTRAVENOUS | Status: AC | PRN
  Administered 2015-02-21: 80 mL via INTRAVENOUS

## 2015-02-21 MED ORDER — FENTANYL CITRATE (PF) 100 MCG/2ML IJ SOLN
50.0000 ug | Freq: Once | INTRAMUSCULAR | Status: AC
  Administered 2015-02-21: 50 ug via INTRAVENOUS
  Filled 2015-02-21: qty 2

## 2015-02-21 NOTE — ED Notes (Signed)
PA explained to the daughter the tests results and discharge plan to pt.

## 2015-02-21 NOTE — Progress Notes (Signed)
Subjective:    Patient ID: [name], female    DOB: 10/04/1934, 80 y.o.   MRN: [id number]  CC: chest pain  HPI: [name] is a 80 y.o. female presenting on 02/21/2015 for Chest Pain  Chest pain that started yesterday during the day. Ongoing since then. Somewhat worse today. Also nauseous today. Some SOB. Sometimes feels like she can't catch her breath. Denies prior heart history. No vomiting. Denies prior MI. Denies prior chest pain. Current smoker.  Relevant past medical, surgical, family and social history reviewed and updated as indicated. Interim medical history since our last visit reviewed. Allergies and medications reviewed and updated.   ROS: Per HPI unless specifically indicated above  Past Medical History Patient Active Problem List   Diagnosis Date Noted  . Insomnia 12/10/2014  . BMI 28.0-28.9,adult 12/10/2014  . Osteopenia 05/19/2014  . GAD (generalized anxiety disorder) 01/06/2014  . Depression 01/06/2014  . GERD (gastroesophageal reflux disease) 01/06/2014  . Vitamin D deficiency 01/06/2014  . Overactive bladder 01/06/2014  . Secondary cardiomyopathy (HCC) 10/06/2013  . Left bundle branch block 10/06/2013  . Dizziness 10/04/2013  . Osteoarthritis of right hip 03/14/2013  . Allergic rhinitis 12/07/2008  . Chronic cough 12/07/2008  . HEPATITIS B 08/16/2008  . Hyperlipidemia 08/16/2008  . Essential hypertension 08/16/2008  . Osteoarthritis 08/16/2008  . DISC DEGENERATION 07/24/2007  . CERVICAL SPASM 07/24/2007    Current Outpatient Prescriptions  Medication Sig Dispense Refill  . ALPRAZolam (XANAX) 0.5 MG tablet TAKE ONE TABLET BY MOUTH DAILY AS NEEDED FOR ANXIETY 30 tablet 1  . amitriptyline (ELAVIL) 25 MG tablet Take 1 tablet (25 mg total) by mouth at bedtime. 90 tablet 1  . aspirin EC 81 MG tablet Take 81 mg by mouth daily.    . Biotin 5000 MCG CAPS Take 1 capsule by mouth daily.    . budesonide-formoterol (SYMBICORT) 160-4.5 MCG/ACT  inhaler Inhale 2 puffs into the lungs 2 (two) times daily. 1 Inhaler 3  . buPROPion (WELLBUTRIN SR) 150 MG 12 hr tablet Take 1 tablet (150 mg total) by mouth 2 (two) times daily. 180 tablet 1  . calcium citrate-vitamin D (CITRACAL+D) 315-200 MG-UNIT per tablet Take 1 tablet by mouth daily.    . carvedilol (COREG) 6.25 MG tablet Take 6.25 mg by mouth 2 (two) times daily with a meal.    . cetirizine (ZYRTEC) 10 MG tablet Take 1 tablet (10 mg total) by mouth daily. 30 tablet 5  . colchicine 0.6 MG tablet Take 1 tablet (0.6 mg total) by mouth daily. 30 tablet 2  . esomeprazole (NEXIUM) 40 MG capsule Take 1 capsule (40 mg total) by mouth daily before breakfast. 90 capsule 1  . fluticasone (FLONASE) 50 MCG/ACT nasal spray Place 2 sprays into both nostrils daily. 16 g 2  . furosemide (LASIX) 20 MG tablet Take 0.5 tablets (10 mg total) by mouth daily. 30 tablet 5  . ibuprofen (ADVIL,MOTRIN) 800 MG tablet Take 1 tablet (800 mg total) by mouth every 8 (eight) hours as needed. 30 tablet 1  . losartan (COZAAR) 50 MG tablet TAKE 1 TABLET (50 MG TOTAL) BY MOUTH DAILY. 90 tablet 1  . ondansetron (ZOFRAN) 4 MG tablet TAKE 1 TABLET (4 MG TOTAL) BY MOUTH EVERY 12 (TWELVE) HOURS AS NEEDED FOR NAUSEA. 15 tablet 0  . oxybutynin (DITROPAN-XL) 10 MG 24 hr tablet Take 1 tablet (10 mg total) by mouth daily. 30 tablet 5  . raloxifene (EVISTA) 60 MG tablet Take 1 tablet (  60 mg total) by mouth daily. 90 tablet 1  . ranitidine (ZANTAC) 150 MG tablet TAKE ONE TABLET BY MOUTH IN THE EVENING 90 tablet 0  . simvastatin (ZOCOR) 10 MG tablet TAKE 1 TABLET (10 MG TOTAL) BY MOUTH AT BEDTIME. 90 tablet 1  . vitamin B-12 (CYANOCOBALAMIN) 1000 MCG tablet Take 500 mcg by mouth daily.     . Vitamin D, Ergocalciferol, (DRISDOL) 50000 UNITS CAPS capsule Take 1 capsule (50,000 Units total) by mouth once a week. 12 capsule 6   No current facility-administered medications for this visit.       Objective:    BP 144/82 mmHg  Pulse 75   SpO2 94%  Wt Readings from Last 3 Encounters:  02/09/15 157 lb 12.8 oz (71.578 kg)  12/10/14 158 lb (71.668 kg)  08/21/14 158 lb 3.2 oz (71.759 kg)     Gen: NAD, alert, cooperative with exam, NCAT EYES: EOMI, no scleral injection or icterus CV: NRRR, normal S1/S2, no murmur, distal pulses 2+ b/l Resp: crackles at bases that clear, no wheezes, comfortable WOB ZOX:WRUE, NTND.  Ext: No edema, warm Neuro: Alert and oriented   EKG: V1-V3 with ST elevation, pt with LBBB today as on prior EKGs.     Assessment & Plan:   Aracelys was seen today for chest pain, is ongoing. EKG w/ LBBB, concerning for ST elevation MI. Gave ASA 325mg  chewable and nitroglycerin. EMS arrived, taking to ED.  Diagnoses and all orders for this visit:  Chest pain on breathing -     EKG 12-Lead   Follow up plan: As needed  , MD Rex Kras [facility] 02/21/2015, 5:07 PM

## 2015-02-21 NOTE — ED Provider Notes (Signed)
CSN: [id number]     Arrival date & time 02/21/15  1817 History   First MD Initiated Contact with Patient 02/21/15 1818     Chief Complaint  Patient presents with  . Chest Pain     (Consider location/radiation/quality/duration/timing/severity/associated sxs/prior Treatment) Patient is a 80 y.o. female presenting with chest pain. The history is provided by the patient and medical records.  Chest Pain Associated symptoms: nausea and shortness of breath    80 year old female with history of hypertension, hyperlipidemia, depression, arthritis, GERD, cardiomyopathy, anxiety, presenting to the ED for chest pain. Patient states she has been having chest pain since Saturday which has been fairly constant. 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  She states since then pain has intensified, now described as a pressure in the left side of her chest with some cramping pain into her left shoulder.  She reports some intermittent SOB and nausea without vomiting.  No diaphoresis.  Patient states she has hx of "heart troubles" but is not entirely sure what is wrong with her heart.  States she is on "heart medicine".  She is an occasional smoker.  Does have family hx of heart disease as well.  No recent illness, fever, chills.  Reports decreased appetite.  Patient initially paged out as Code STEMI from urgent care due to LBBB, however cancelled en route.  Patient has received 325 ASA, 3 NTG, and 4mg  morphine PTA.  Was previously seen be cardiology, Dr. , however no recent follow-up.  VSS.  Past Medical History  Diagnosis Date  . Essential hypertension, benign   . Mixed hyperlipidemia   . Depression   . Allergic rhinitis   . GERD (gastroesophageal reflux disease)   . Arthritis   . History of blood transfusion   . Left bundle branch block   . History of cardiac catheterization     Normal coronaries 2010  . Secondary  cardiomyopathy (HCC)     LVEF 40-45%, likely nonischemic  . Anxiety   . Osteopenia   . Cataract     had eye surgery   Past Surgical History  Procedure Laterality Date  . Cholecystectomy  1991  . Tonsillectomy    . Appendectomy    . Back surgery  07/21/2008  . Rotator cuff repair  2011    right  . Cataract extraction w/phaco  04/16/2011    Procedure: CATARACT EXTRACTION PHACO AND INTRAOCULAR LENS PLACEMENT (IOC);  Surgeon: 04/18/2011;  Location: [facility];  Service: Ophthalmology;  Laterality: Left;  CDE=11.35  . Hammer toe surgery  11/22/11    [facility], 11/24/11  . Cataract extraction w/phaco  01/28/2012    Procedure: CATARACT EXTRACTION PHACO AND INTRAOCULAR LENS PLACEMENT (IOC);  Surgeon: 01/30/2012, MD;  Location: [facility];  Service: Ophthalmology;  Laterality: Right;  CDI:8.15  . Cysto with hydrodistension  05/29/2012    Procedure: CYSTOSCOPY/HYDRODISTENSION;  Surgeon: 05/31/2012, MD;  Location: [facility];  Service: Urology;  Laterality: N/A;  INSTILLATION OF MARCAINE AND PYRIDIUM   . Lumbar laminectomy/decompression microdiscectomy Left 06/06/2012    Procedure: LUMBAR LAMINECTOMY/DECOMPRESSION MICRODISCECTOMY 1 LEVEL;  Surgeon: 08/04/2012, MD;  Location: [facility];  Service: Neurosurgery;  Laterality: Left;  Left Lumbar two-three Laminectomy for resection of synovial cyst  . Total hip arthroplasty Right 03/13/2013    Procedure: TOTAL HIP ARTHROPLASTY;  Surgeon: 03/15/2013, MD;  Location: [facility];  Service: Orthopedics;  Laterality: Right;  . Eye surgery Bilateral     catar  . Abdominal hysterectomy      complete  . Spine surgery      back suregry 2011  . Joint replacement      right hip replacement 2013   Family History  Problem Relation Age of Onset  . Colon cancer Neg Hx   . Anesthesia problems Neg Hx   . Hypotension Neg Hx   . Malignant hyperthermia Neg Hx   . Pseudochol deficiency Neg Hx   . Diabetes Other   . Allergies Other   . Diabetes  Father   . Diabetes Brother   . Cancer Brother 32    colon  . Diabetes Brother   . Diabetes Brother   . Hypertension Sister   . Diabetes Sister   . Diabetes Sister    Social History  Substance Use Topics  . Smoking status: Current Some Day Smoker -- 0.10 packs/day for 40 years    Types: Cigarettes  . Smokeless tobacco: Never Used     Comment: smokes 2 cigarettes a day  . Alcohol Use: No   OB History    No data available     Review of Systems  Respiratory: Positive for shortness of breath.   Cardiovascular: Positive for chest pain.  Gastrointestinal: Positive for nausea.  All other systems reviewed and are negative.     Allergies  Codeine; Lisinopril-hydrochlorothiazide; and Penicillins  Home Medications   Prior to Admission medications   Medication Sig Start Date End Date Taking? Authorizing Provider  ALPRAZolam Prudy Feeler) 0.5 MG tablet TAKE ONE TABLET BY MOUTH DAILY AS NEEDED FOR ANXIETY 12/10/14   Mary-Margaret Daphine Deutscher, FNP  amitriptyline (ELAVIL) 25 MG tablet Take 1 tablet (25 mg total) by mouth at bedtime. 12/10/14   Mary-Margaret Daphine Deutscher, FNP  aspirin EC 81 MG tablet Take 81 mg by mouth daily.    Historical Provider, MD  Biotin 5000 MCG CAPS Take 1 capsule by mouth daily.    Historical Provider, MD  budesonide-formoterol (SYMBICORT) 160-4.5 MCG/ACT inhaler Inhale 2 puffs into the lungs 2 (two) times daily. 12/10/14   Mary-Margaret Daphine Deutscher, FNP  buPROPion (WELLBUTRIN SR) 150 MG 12 hr tablet Take 1 tablet (150 mg total) by mouth 2 (two) times daily. 12/10/14   Mary-Margaret Daphine Deutscher, FNP  calcium citrate-vitamin D (CITRACAL+D) 315-200 MG-UNIT per tablet Take 1 tablet by mouth daily.    Historical Provider, MD  carvedilol (COREG) 6.25 MG tablet Take 6.25 mg by mouth 2 (two) times daily with a meal.    Historical Provider, MD  cetirizine (ZYRTEC) 10 MG tablet Take 1 tablet (10 mg total) by mouth daily. 02/09/15   Elige Radon Dettinger, MD  colchicine 0.6 MG tablet Take 1 tablet (0.6  mg total) by mouth daily. 08/20/14   Junie Spencer, FNP  esomeprazole (NEXIUM) 40 MG capsule Take 1 capsule (40 mg total) by mouth daily before breakfast. 12/10/14   Mary-Margaret Daphine Deutscher, FNP  fluticasone (FLONASE) 50 MCG/ACT nasal spray Place 2 sprays into both nostrils daily. 02/09/15   Elige Radon Dettinger, MD  furosemide (LASIX) 20 MG tablet Take 0.5 tablets (10 mg total) by mouth daily. 12/10/14   Mary-Margaret Daphine Deutscher, FNP  ibuprofen (ADVIL,MOTRIN) 800 MG tablet Take 1 tablet (800 mg total) by mouth every 8 (eight) hours as needed. 02/09/15   Elige Radon Dettinger, MD  losartan (COZAAR) 50 MG tablet TAKE 1 TABLET (50 MG TOTAL) BY MOUTH DAILY. 12/10/14   Mary-Margaret Daphine Deutscher, FNP  ondansetron (  ZOFRAN) 4 MG tablet TAKE 1 TABLET (4 MG TOTAL) BY MOUTH EVERY 12 (TWELVE) HOURS AS NEEDED FOR NAUSEA. 08/19/14   Junie Spencer, FNP  oxybutynin (DITROPAN-XL) 10 MG 24 hr tablet Take 1 tablet (10 mg total) by mouth daily. 12/10/14   Mary-Margaret Daphine Deutscher, FNP  raloxifene (EVISTA) 60 MG tablet Take 1 tablet (60 mg total) by mouth daily. 12/10/14   Mary-Margaret Daphine Deutscher, FNP  ranitidine (ZANTAC) 150 MG tablet TAKE ONE TABLET BY MOUTH IN THE EVENING 02/10/15   Mary-Margaret Daphine Deutscher, FNP  simvastatin (ZOCOR) 10 MG tablet TAKE 1 TABLET (10 MG TOTAL) BY MOUTH AT BEDTIME. 12/10/14   Mary-Margaret Daphine Deutscher, FNP  vitamin B-12 (CYANOCOBALAMIN) 1000 MCG tablet Take 500 mcg by mouth daily.     Historical Provider, MD  Vitamin D, Ergocalciferol, (DRISDOL) 50000 UNITS CAPS capsule Take 1 capsule (50,000 Units total) by mouth once a week. 12/10/14   Mary-Margaret Daphine Deutscher, FNP   BP 136/76 mmHg  Pulse 86  Temp(Src) 98.7 F (37.1 C) (Oral)  Resp 16  SpO2 99%   Physical Exam  Constitutional: She is oriented to person, place, and time. She appears well-developed and well-nourished. No distress.  HENT:  Head: Normocephalic and atraumatic.  Mouth/Throat: Oropharynx is clear and moist.  Eyes: Conjunctivae and EOM are normal. Pupils are  equal, round, and reactive to light.  Neck: Normal range of motion. Neck supple.  Cardiovascular: Normal rate, regular rhythm and normal heart sounds.   Pulmonary/Chest: Effort normal and breath sounds normal. No respiratory distress. She has no wheezes. She has no rhonchi. She has no rales.  Respirations unlabored, lungs clear bilaterally; speaking in full sentences without difficulty  Abdominal: Soft. Bowel sounds are normal. There is no tenderness. There is no guarding.  Musculoskeletal: Normal range of motion. She exhibits no edema.  No edema No calf asymmetry, tenderness, or palpable cords; no overlying skin changes  Neurological: She is alert and oriented to person, place, and time.  Skin: Skin is warm and dry. She is not diaphoretic.  Psychiatric: She has a normal mood and affect.  Nursing note and vitals reviewed.   ED Course  Procedures (including critical care time) Labs Review Labs Reviewed  BASIC METABOLIC PANEL - Abnormal; Notable for the following:    Potassium 3.3 (*)    Calcium 8.8 (*)    GFR calc non Af Amer 56 (*)    All other components within normal limits  BRAIN NATRIURETIC PEPTIDE - Abnormal; Notable for the following:    B Natriuretic Peptide 166.2 (*)    All other components within normal limits  CBC WITH DIFFERENTIAL/PLATELET  Rosezena Sensor, ED  Rosezena Sensor, ED    Imaging Review Dg Chest 2 View  02/21/2015  CLINICAL DATA:  Chest pain, shortness of Breath EXAM: CHEST  2 VIEW COMPARISON:  10/04/2013 FINDINGS: Cardiomediastinal silhouette is stable. No acute infiltrate or pleural effusion. No pulmonary edema. Bony thorax is unremarkable. IMPRESSION: No active cardiopulmonary disease. Electronically Signed   By: Natasha Mead M.D.   On: 02/21/2015 19:33   Ct Angio Chest Pe W/cm &/or Wo Cm  02/22/2015  CLINICAL DATA:  LEFT chest pain, worse with inspiration beginning 4 days ago, worsening today. EXAM: CT ANGIOGRAPHY CHEST WITH CONTRAST TECHNIQUE:  Multidetector CT imaging of the chest was performed using the standard protocol during bolus administration of intravenous contrast. Multiplanar CT image reconstructions and MIPs were obtained to evaluate the vascular anatomy. CONTRAST:  69mL OMNIPAQUE IOHEXOL 350 MG/ML SOLN COMPARISON:  Chest radiograph  February 21, 2015 at 1857 hours and CT chest Sep 10, 2012 FINDINGS: PULMONARY ARTERY: Adequate contrast opacification of the pulmonary artery's. Main pulmonary artery is borderline enlarged at 3 cm. No pulmonary arterial filling defects to the level of the subsegmental branches. MEDIASTINUM: The heart is moderately enlarged, increased from prior CT. No pericardial effusions. Mild coronary artery calcifications. Thoracic aorta is normal course and caliber, mild calcific atherosclerosis the aortic arch. No lymphadenopathy by CT size criteria. Calcified RIGHT hilar lymph nodes. LUNGS: Tracheobronchial tree is patent, no pneumothorax. Patchy dependent ground-glass opacities, pulmonary vascular congestion. LEFT lower lobe atelectasis. RIGHT upper lobe 6 mm pulmonary nodule is similar in size. SOFT TISSUES AND OSSEOUS STRUCTURES: Included view of the abdomen is nonacute; small hiatal hernia. Subcentimeter probable cyst LEFT lobe of the liver. Severe degenerative changes of bilateral shoulder wrist. Review of the MIP images confirms the above findings. IMPRESSION: No acute pulmonary embolism. Increasing moderate cardiomegaly, findings of pulmonary edema without pleural effusion. Electronically Signed   By: Awilda Metro M.D.   On: 02/22/2015 00:05   I have personally reviewed and evaluated these images and lab results as part of my medical decision-making.   EKG Interpretation   Date/Time:  Monday February 21 2015 18:20:28 EDT Ventricular Rate:  79 PR Interval:  166 QRS Duration: 139 QT Interval:  473 QTC Calculation: 542 R Axis:   53 Text Interpretation:  Sinus rhythm Atrial premature complex Left bundle   branch block Confirmed by BEATON  MD, ROBERT ([id number]) on 02/21/2015 6:28:56  PM      MDM   Final diagnoses:  Chest pain, unspecified chest pain type   80 year old female here with chest pain ongoing for the past 48 hours.  Initially called as code STEMI, canceled by cardiology as left bundle branch lock is known.  Initial troponin negative. Chest x-ray is clear. Labwork is reassuring. BNP minimally elevated at 166. Patient has not had any relief from aspirin, nitroglycerin, or morphine. She does have extensive history of GERD, will offer GI cocktail.  8:58 PM Patient reassessed.  Pain completely resolved with GI cocktail, patient now requesting to eat/drink.  Given her age and cardiac RF, will still obtain delta troponin.  11:00 PM Delta troponin negative.  Patient other daughter who lives in [place] has called and is upset that patient is to be discharged.  I have explained testing/results thus far and that we feel it is safe for her to be discharged.  Daughter remains upset, feels that we are "guessing" with her mom's medical care, and does not feel she should be discharged. Dr. Radford Pax has also gone in to speak with family-- will add CTA of chest to ensure no PE, if negative then she can be discharged home to follow-up with PCP and cardiology.  12:20 AM CTA negative for PE/dissection.  Findings of pulmonary edema-- patient does have hx of cardiomyopathy.  BNP minimally elevated at 166.2.  Patient's VS have remained stable on RA, no signs of respiratory distress.  Feel that patient has had reasonable workup and is stable for discharge at this time.  I have recommended that she continue her meds as directed including lasix and follow-up with PCP and cardiology.  After lengthy discussion with patient and daughter about work-up today, they are comfortable with discharge home.  I have given them print outs of lab/imaging from today's visit.  Discussed plan with patient, he/she acknowledged  understanding and agreed with plan of care.  Return precautions given for new or  worsening symptoms.  Case discussed with attending physician, Dr. Radford Pax, who evaluated patient and agrees with assessment and plan of care.  Garlon Hatchet, PA-C 02/22/15 0045  Nelva Nay, MD 02/24/15 [phone]

## 2015-02-21 NOTE — ED Notes (Addendum)
EDP explained tests results and plan of care to pt. and family . 

## 2015-02-21 NOTE — ED Notes (Signed)
Pt remains monitored by blood pressure, pulse ox, and 12 lead. pts family remains at bedside.  

## 2015-02-21 NOTE — ED Notes (Signed)
Pt placed on monitor upon return to room from restroom. Pt was able to ambulate to the restroom. Pt remains monitored by blood pressure, pulse ox, and 12 lead.

## 2015-02-21 NOTE — ED Notes (Signed)
[facility]- pt coming from PCP office with increase in chest pain since Saturday. Pt also reports some nausea. EKG submitted by EMS which was initially called a code stemi but was cancelled by cards PTA. Pt had a total of 324 of ASA, 3 nitro, 4mg  Morphine.

## 2015-02-21 NOTE — ED Notes (Signed)
Dr, Radford Pax explained to the pt. and family the plan of care .

## 2015-02-22 MED ORDER — NAPROXEN 500 MG PO TABS: 500.0000 mg | ORAL_TABLET | Freq: Two times a day (BID) | ORAL | Status: DC

## 2015-02-22 NOTE — Discharge Instructions (Signed)
Take the prescribed medication as directed. Follow-up with your primary care physician. Also recommend that you follow-up with cardiology-- call tomorrow to make appt.   Copies of labs/imaging studies on back for physician review. Return to the ED for new or worsening symptoms.

## 2015-02-22 NOTE — Telephone Encounter (Signed)
Pt came into office yesterday and was seen by Dr.Vincent and sent to [facility] by EMS.

## 2015-02-22 NOTE — ED Notes (Signed)
Reviewed discharge instructions and prescription medication with patient and family member.  Patient verbalized understanding r/e follow-up information and medications.

## 2015-02-24 DIAGNOSIS — N3941 Urge incontinence: Secondary | ICD-10-CM | POA: Diagnosis not present

## 2015-03-01 ENCOUNTER — Encounter: Payer: Self-pay | Admitting: Gastroenterology

## 2015-03-03 DIAGNOSIS — N3941 Urge incontinence: Secondary | ICD-10-CM | POA: Diagnosis not present

## 2015-03-10 DIAGNOSIS — N3941 Urge incontinence: Secondary | ICD-10-CM | POA: Diagnosis not present

## 2015-03-11 ENCOUNTER — Ambulatory Visit: Payer: Medicare Other | Admitting: Pediatrics

## 2015-03-14 ENCOUNTER — Ambulatory Visit (INDEPENDENT_AMBULATORY_CARE_PROVIDER_SITE_OTHER): Payer: Medicare Other | Admitting: Pediatrics

## 2015-03-14 ENCOUNTER — Encounter: Payer: Self-pay | Admitting: Pediatrics

## 2015-03-14 VITALS — BP 156/82 | HR 71 | Temp 98.7°F | Ht 62.0 in | Wt 156.0 lb

## 2015-03-14 DIAGNOSIS — J449 Chronic obstructive pulmonary disease, unspecified: Secondary | ICD-10-CM | POA: Diagnosis not present

## 2015-03-14 MED ORDER — ALBUTEROL SULFATE HFA 108 (90 BASE) MCG/ACT IN AERS: 2.0000 | INHALATION_SPRAY | Freq: Four times a day (QID) | RESPIRATORY_TRACT | Status: DC | PRN

## 2015-03-14 MED ORDER — SPACER/AERO CHAMBER MOUTHPIECE MISC: Status: DC

## 2015-03-14 NOTE — Progress Notes (Signed)
Subjective:    Patient ID: [name], female    DOB: 1934-07-01, 80 y.o.   MRN: [id number]  CC: shortness of breath  HPI: [name] is a 80 y.o. female presenting on 03/14/2015 for Medication Dose Change  No chest pain recently Breathing doing ok unless she tries to walk and exercise. Uses symbicort and breathing gets easier, sometimes has to sit down H/o smoking, still smokes 1 cigarette a week or so She is not sure if she has ever been on rescue inhaler, has been on symbicort for some years. No fevers No URI symptoms Tries to walk regularly  Relevant past medical, surgical, family and social history reviewed and updated as indicated. Interim medical history since our last visit reviewed. Allergies and medications reviewed and updated.   ROS: Per HPI unless specifically indicated above  Past Medical History Patient Active Problem List   Diagnosis Date Noted  . Insomnia 12/10/2014  . BMI 28.0-28.9,adult 12/10/2014  . Osteopenia 05/19/2014  . GAD (generalized anxiety disorder) 01/06/2014  . Depression 01/06/2014  . GERD (gastroesophageal reflux disease) 01/06/2014  . Vitamin D deficiency 01/06/2014  . Overactive bladder 01/06/2014  . Secondary cardiomyopathy (HCC) 10/06/2013  . Left bundle branch block 10/06/2013  . Dizziness 10/04/2013  . Osteoarthritis of right hip 03/14/2013  . Allergic rhinitis 12/07/2008  . Chronic cough 12/07/2008  . HEPATITIS B 08/16/2008  . Hyperlipidemia 08/16/2008  . Essential hypertension 08/16/2008  . Osteoarthritis 08/16/2008  . DISC DEGENERATION 07/24/2007  . CERVICAL SPASM 07/24/2007    Current Outpatient Prescriptions  Medication Sig Dispense Refill  . ALPRAZolam (XANAX) 0.5 MG tablet TAKE ONE TABLET BY MOUTH DAILY AS NEEDED FOR ANXIETY 30 tablet 1  . amitriptyline (ELAVIL) 25 MG tablet Take 1 tablet (25 mg total) by mouth at bedtime. 90 tablet 1  . aspirin EC 81 MG tablet Take 81 mg by mouth daily.    . Biotin 5000  MCG CAPS Take 1 capsule by mouth daily.    . budesonide-formoterol (SYMBICORT) 160-4.5 MCG/ACT inhaler Inhale 2 puffs into the lungs 2 (two) times daily. 1 Inhaler 3  . buPROPion (WELLBUTRIN SR) 150 MG 12 hr tablet Take 1 tablet (150 mg total) by mouth 2 (two) times daily. 180 tablet 1  . calcium citrate-vitamin D (CITRACAL+D) 315-200 MG-UNIT per tablet Take 2 tablets by mouth daily.     . carvedilol (COREG) 6.25 MG tablet Take 6.25 mg by mouth 2 (two) times daily with a meal.    . cetirizine (ZYRTEC) 10 MG tablet Take 1 tablet (10 mg total) by mouth daily. 30 tablet 5  . esomeprazole (NEXIUM) 40 MG capsule Take 1 capsule (40 mg total) by mouth daily before breakfast. 90 capsule 1  . fluticasone (FLONASE) 50 MCG/ACT nasal spray Place 2 sprays into both nostrils daily. 16 g 2  . furosemide (LASIX) 20 MG tablet Take 0.5 tablets (10 mg total) by mouth daily. (Patient taking differently: Take 20 mg by mouth daily. ) 30 tablet 5  . ibuprofen (ADVIL,MOTRIN) 800 MG tablet Take 1 tablet (800 mg total) by mouth every 8 (eight) hours as needed. (Patient taking differently: Take 800 mg by mouth every 8 (eight) hours as needed for moderate pain. ) 30 tablet 1  . losartan (COZAAR) 50 MG tablet TAKE 1 TABLET (50 MG TOTAL) BY MOUTH DAILY. (Patient taking differently: Take 25 mg by mouth daily. ) 90 tablet 1  . naproxen (NAPROSYN) 500 MG tablet Take 1 tablet (500 mg total)  by mouth 2 (two) times daily with a meal. 30 tablet 0  . ondansetron (ZOFRAN) 4 MG tablet TAKE 1 TABLET (4 MG TOTAL) BY MOUTH EVERY 12 (TWELVE) HOURS AS NEEDED FOR NAUSEA. 15 tablet 0  . oxybutynin (DITROPAN-XL) 10 MG 24 hr tablet Take 1 tablet (10 mg total) by mouth daily. 30 tablet 5  . raloxifene (EVISTA) 60 MG tablet Take 1 tablet (60 mg total) by mouth daily. 90 tablet 1  . ranitidine (ZANTAC) 150 MG tablet TAKE ONE TABLET BY MOUTH IN THE EVENING 90 tablet 0  . simvastatin (ZOCOR) 10 MG tablet TAKE 1 TABLET (10 MG TOTAL) BY MOUTH AT BEDTIME.  90 tablet 1  . vitamin B-12 (CYANOCOBALAMIN) 1000 MCG tablet Take 500 mcg by mouth daily.     . Vitamin D, Ergocalciferol, (DRISDOL) 50000 UNITS CAPS capsule Take 1 capsule (50,000 Units total) by mouth once a week. (Patient taking differently: Take 50,000 Units by mouth every Monday. ) 12 capsule 6  . albuterol (PROVENTIL HFA;VENTOLIN HFA) 108 (90 BASE) MCG/ACT inhaler Inhale 2 puffs into the lungs every 6 (six) hours as needed for wheezing or shortness of breath. 1 Inhaler 1  . Spacer/Aero Chamber Mouthpiece MISC Please dispense one spacer for use with inhaler. 1 each 0   No current facility-administered medications for this visit.       Objective:    BP 156/82 mmHg  Pulse 71  Temp(Src) 98.7 F (37.1 C) (Oral)  Ht 5\' 2"  (1.575 m)  Wt 156 lb (70.761 kg)  BMI 28.53 kg/m2  Wt Readings from Last 3 Encounters:  03/14/15 156 lb (70.761 kg)  02/09/15 157 lb 12.8 oz (71.578 kg)  12/10/14 158 lb (71.668 kg)    Gen: NAD, alert, cooperative with exam, NCAT EYES: EOMI, no scleral injection or icterus ENT:  TMs pearly gray b/l, OP without erythema LYMPH: no cervical LAD CV: NRRR, normal S1/S2, no murmur, distal pulses 2+ b/l Resp: decreased breath sounds b/l, no wheezes, normal WOB Abd: +BS, soft, NTND. no guarding or organomegaly Ext: No edema, warm Neuro: Alert and oriented, coordination grossly normal     Assessment & Plan:   [name] was seen today for medication change, shortness of breath.  Diagnoses and all orders for this visit:  Chronic obstructive pulmonary disease, unspecified COPD type (HCC) -     albuterol (PROVENTIL HFA;VENTOLIN HFA) 108 (90 BASE) MCG/ACT inhaler; Inhale 2 puffs into the lungs every 6 (six) hours as needed for wheezing or shortness of breath. -     Spacer/Aero Chamber Mouthpiece MISC; Please dispense one spacer for use with inhaler. -     RTC for spirometry   Follow up plan: Return in about 4 weeks (around 04/11/2015).  14/03/2015, MD [facility] 03/14/2015, 4:57 PM

## 2015-03-17 DIAGNOSIS — N3941 Urge incontinence: Secondary | ICD-10-CM | POA: Diagnosis not present

## 2015-03-31 DIAGNOSIS — N3941 Urge incontinence: Secondary | ICD-10-CM | POA: Diagnosis not present

## 2015-04-05 DIAGNOSIS — R350 Frequency of micturition: Secondary | ICD-10-CM | POA: Diagnosis not present

## 2015-04-05 DIAGNOSIS — N3946 Mixed incontinence: Secondary | ICD-10-CM | POA: Diagnosis not present

## 2015-04-06 ENCOUNTER — Ambulatory Visit (INDEPENDENT_AMBULATORY_CARE_PROVIDER_SITE_OTHER): Payer: Medicare Other | Admitting: Pediatrics

## 2015-04-06 ENCOUNTER — Encounter: Payer: Self-pay | Admitting: Pediatrics

## 2015-04-06 ENCOUNTER — Encounter: Payer: Self-pay | Admitting: Family Medicine

## 2015-04-06 VITALS — BP 157/85 | HR 75 | Temp 97.4°F | Ht 62.0 in | Wt 159.4 lb

## 2015-04-06 DIAGNOSIS — F32A Depression, unspecified: Secondary | ICD-10-CM

## 2015-04-06 DIAGNOSIS — M542 Cervicalgia: Secondary | ICD-10-CM | POA: Diagnosis not present

## 2015-04-06 DIAGNOSIS — M25561 Pain in right knee: Secondary | ICD-10-CM

## 2015-04-06 DIAGNOSIS — R053 Chronic cough: Secondary | ICD-10-CM

## 2015-04-06 DIAGNOSIS — G4700 Insomnia, unspecified: Secondary | ICD-10-CM

## 2015-04-06 DIAGNOSIS — R05 Cough: Secondary | ICD-10-CM

## 2015-04-06 DIAGNOSIS — I10 Essential (primary) hypertension: Secondary | ICD-10-CM

## 2015-04-06 DIAGNOSIS — F329 Major depressive disorder, single episode, unspecified: Secondary | ICD-10-CM | POA: Diagnosis not present

## 2015-04-06 DIAGNOSIS — E876 Hypokalemia: Secondary | ICD-10-CM | POA: Diagnosis not present

## 2015-04-06 LAB — PULMONARY FUNCTION TEST

## 2015-04-06 NOTE — Progress Notes (Signed)
Subjective:    Patient ID: [name], female    DOB: April 04, 1935, 80 y.o.   MRN: [id number]  CC: Leg Pain   HPI: [name] is a 80 y.o. female presenting for Leg Pain  Has had neck pain off and on for months Now can't turn head all the way to the L due to pain Had lower back surgery more than a decade ago, has had some neck stiffness since then  About a week ago started having R upper leg pain. Hurts when she presses on it or when she puts weight on it, doesn't hurt when just sitting Nothing seems to help for leg or neck pain--has tried advil 400mg  at a time, once a day. No swelling  Insomnia: takes xanax once every two weeks as needed.  No chest pain or SOB  Depression: currently well controlled on wellbutirn and amitriptyline  H/o smoking, on symbicort and albuteorl, no PFTs.  No fevers, normal appetite   Depression screen [facility] 2/9 04/06/2015 03/14/2015 08/09/2014 03/20/2014 02/15/2014  Decreased Interest 0 0 1 0 0  Down, Depressed, Hopeless 0 0 1 0 1  PHQ - 2 Score 0 0 2 0 1  Altered sleeping - - 1 - -  Tired, decreased energy - - 1 - -  Change in appetite - - 1 - -  Feeling bad or failure about yourself  - - 0 - -  Trouble concentrating - - 2 - -  Moving slowly or fidgety/restless - - 1 - -  Suicidal thoughts - - 0 - -  PHQ-9 Score - - 8 - -     Relevant past medical, surgical, family and social history reviewed and updated as indicated. Interim medical history since our last visit reviewed. Allergies and medications reviewed and updated.    ROS: Per HPI unless specifically indicated above  History  Smoking status  . Current Some Day Smoker -- 0.10 packs/day for 40 years  . Types: Cigarettes  Smokeless tobacco  . Never Used    Comment: smokes 2 cigarettes a day    Past Medical History Patient Active Problem List   Diagnosis Date Noted  . Neck pain 04/06/2015  . Insomnia 12/10/2014  . BMI 28.0-28.9,adult 12/10/2014  . Osteopenia  05/19/2014  . GAD (generalized anxiety disorder) 01/06/2014  . Depression 01/06/2014  . GERD (gastroesophageal reflux disease) 01/06/2014  . Vitamin D deficiency 01/06/2014  . Overactive bladder 01/06/2014  . Secondary cardiomyopathy (HCC) 10/06/2013  . Left bundle branch block 10/06/2013  . Dizziness 10/04/2013  . Osteoarthritis of right hip 03/14/2013  . Allergic rhinitis 12/07/2008  . Chronic cough 12/07/2008  . HEPATITIS B 08/16/2008  . Hyperlipidemia 08/16/2008  . Essential hypertension 08/16/2008  . Osteoarthritis 08/16/2008  . DISC DEGENERATION 07/24/2007  . CERVICAL SPASM 07/24/2007    Current Outpatient Prescriptions  Medication Sig Dispense Refill  . albuterol (PROVENTIL HFA;VENTOLIN HFA) 108 (90 BASE) MCG/ACT inhaler Inhale 2 puffs into the lungs every 6 (six) hours as needed for wheezing or shortness of breath. 1 Inhaler 1  . ALPRAZolam (XANAX) 0.5 MG tablet TAKE ONE TABLET BY MOUTH DAILY AS NEEDED FOR ANXIETY 30 tablet 1  . amitriptyline (ELAVIL) 25 MG tablet Take 1 tablet (25 mg total) by mouth at bedtime. 90 tablet 1  . aspirin EC 81 MG tablet Take 81 mg by mouth daily.    . Biotin 5000 MCG CAPS Take 1 capsule by mouth daily.    . budesonide-formoterol (SYMBICORT) 160-4.5  MCG/ACT inhaler Inhale 2 puffs into the lungs 2 (two) times daily. 1 Inhaler 3  . buPROPion (WELLBUTRIN SR) 150 MG 12 hr tablet Take 1 tablet (150 mg total) by mouth 2 (two) times daily. 180 tablet 1  . calcium citrate-vitamin D (CITRACAL+D) 315-200 MG-UNIT per tablet Take 2 tablets by mouth daily.     . carvedilol (COREG) 6.25 MG tablet Take 6.25 mg by mouth 2 (two) times daily with a meal.    . cetirizine (ZYRTEC) 10 MG tablet Take 1 tablet (10 mg total) by mouth daily. 30 tablet 5  . esomeprazole (NEXIUM) 40 MG capsule Take 1 capsule (40 mg total) by mouth daily before breakfast. 90 capsule 1  . fluticasone (FLONASE) 50 MCG/ACT nasal spray Place 2 sprays into both nostrils daily. 16 g 2  .  furosemide (LASIX) 20 MG tablet Take 0.5 tablets (10 mg total) by mouth daily. (Patient taking differently: Take 20 mg by mouth daily. ) 30 tablet 5  . ibuprofen (ADVIL,MOTRIN) 800 MG tablet Take 1 tablet (800 mg total) by mouth every 8 (eight) hours as needed. (Patient taking differently: Take 800 mg by mouth every 8 (eight) hours as needed for moderate pain. ) 30 tablet 1  . losartan (COZAAR) 50 MG tablet TAKE 1 TABLET (50 MG TOTAL) BY MOUTH DAILY. (Patient taking differently: Take 25 mg by mouth daily. ) 90 tablet 1  . naproxen (NAPROSYN) 500 MG tablet Take 1 tablet (500 mg total) by mouth 2 (two) times daily with a meal. 30 tablet 0  . ondansetron (ZOFRAN) 4 MG tablet TAKE 1 TABLET (4 MG TOTAL) BY MOUTH EVERY 12 (TWELVE) HOURS AS NEEDED FOR NAUSEA. 15 tablet 0  . oxybutynin (DITROPAN-XL) 10 MG 24 hr tablet Take 1 tablet (10 mg total) by mouth daily. 30 tablet 5  . raloxifene (EVISTA) 60 MG tablet Take 1 tablet (60 mg total) by mouth daily. 90 tablet 1  . ranitidine (ZANTAC) 150 MG tablet TAKE ONE TABLET BY MOUTH IN THE EVENING 90 tablet 0  . simvastatin (ZOCOR) 10 MG tablet TAKE 1 TABLET (10 MG TOTAL) BY MOUTH AT BEDTIME. 90 tablet 1  . Spacer/Aero Chamber Mouthpiece MISC Please dispense one spacer for use with inhaler. 1 each 0  . vitamin B-12 (CYANOCOBALAMIN) 1000 MCG tablet Take 500 mcg by mouth daily.     . Vitamin D, Ergocalciferol, (DRISDOL) 50000 UNITS CAPS capsule Take 1 capsule (50,000 Units total) by mouth once a week. (Patient taking differently: Take 50,000 Units by mouth every Monday. ) 12 capsule 6   No current facility-administered medications for this visit.       Objective:    BP 157/85 mmHg  Pulse 75  Temp(Src) 97.4 F (36.3 C) (Oral)  Ht 5\' 2"  (1.575 m)  Wt 159 lb 6.4 oz (72.303 kg)  BMI 29.15 kg/m2  Wt Readings from Last 3 Encounters:  04/06/15 159 lb 6.4 oz (72.303 kg)  03/14/15 156 lb (70.761 kg)  02/09/15 157 lb 12.8 oz (71.578 kg)    Gen: NAD, alert,  cooperative with exam, NCAT EYES: EOMI, no scleral injection or icterus ENT:  TMs pearly gray b/l, OP without erythema LYMPH: no cervical LAD CV: NRRR, normal S1/S2, no murmur, distal pulses 2+ b/l Resp: CTABL, no wheezes, normal WOB Abd: +BS, soft, NTND. no guarding or organomegaly Ext: No edema, warm Neuro: Alert and oriented, strength equal b/l UE and LE MSK: legs symmetric, normal to inspection. No bruising or swelling over R leg. Slightly tender  in quad muscle with palpation though no tenderness or soreness with quad strength testing. No swelling. Neck ROM slightly stiff, TTP over L side of neck muscle bodies. No point tenderness over spine.     Assessment & Plan:    Mackenize was seen today for leg pain and follow up multiple med problems.  Diagnoses and all orders for this visit:  Depression well controlled with current medications, continue  Insomnia Not using xanax often, once every two weeks. Discussed stopping this medicine completely due to concerns for causing dizziness and falls. Pt in agreement. Try melatonin for insomnia.  Neck pain Discussed gentle ROM exercises. No known injury. Let me know if not improving.  Arthralgia of right lower leg Tender in muscle body, rest, gentle NSAIDs, will let me know if continues.  Hypokalemia On last check, will repeat below. -     Basic Metabolic Panel  Chronic cough Pt likely with COPD, never been diagnosed. PFTs today. -     PR BREATHING CAPACITY TEST  Follow up plan: 2 months  Rex Kras, MD [facility] 04/06/2015, 8:54 AM

## 2015-04-06 NOTE — Patient Instructions (Addendum)
For sleep try melatonin  Symbicort two puffs morning and two puffs at night

## 2015-04-09 ENCOUNTER — Telehealth: Payer: Self-pay | Admitting: *Deleted

## 2015-04-09 DIAGNOSIS — T753XXA Motion sickness, initial encounter: Secondary | ICD-10-CM

## 2015-04-09 MED ORDER — SCOPOLAMINE 1 MG/3DAYS TD PT72: 1.0000 | MEDICATED_PATCH | TRANSDERMAL | Status: DC

## 2015-04-09 NOTE — Telephone Encounter (Signed)
rx sent to pharmacy

## 2015-04-09 NOTE — Telephone Encounter (Signed)
Leaving for a cruise in the morning. Could you please send in patches to use for sea sickness?  It would be so appreciated.

## 2015-04-18 ENCOUNTER — Ambulatory Visit: Payer: Medicare Other | Admitting: Pediatrics

## 2015-04-19 ENCOUNTER — Ambulatory Visit (INDEPENDENT_AMBULATORY_CARE_PROVIDER_SITE_OTHER): Payer: Medicare Other

## 2015-04-19 ENCOUNTER — Ambulatory Visit (INDEPENDENT_AMBULATORY_CARE_PROVIDER_SITE_OTHER): Payer: Medicare Other | Admitting: Pediatrics

## 2015-04-19 ENCOUNTER — Encounter: Payer: Self-pay | Admitting: Pediatrics

## 2015-04-19 VITALS — BP 145/79 | HR 84 | Temp 97.7°F | Ht 62.0 in | Wt 159.6 lb

## 2015-04-19 DIAGNOSIS — M542 Cervicalgia: Secondary | ICD-10-CM

## 2015-04-19 DIAGNOSIS — R110 Nausea: Secondary | ICD-10-CM

## 2015-04-19 DIAGNOSIS — Z8619 Personal history of other infectious and parasitic diseases: Secondary | ICD-10-CM | POA: Diagnosis not present

## 2015-04-19 DIAGNOSIS — I10 Essential (primary) hypertension: Secondary | ICD-10-CM

## 2015-04-19 DIAGNOSIS — E876 Hypokalemia: Secondary | ICD-10-CM | POA: Diagnosis not present

## 2015-04-19 DIAGNOSIS — J449 Chronic obstructive pulmonary disease, unspecified: Secondary | ICD-10-CM

## 2015-04-19 MED ORDER — ONDANSETRON HCL 4 MG PO TABS: ORAL_TABLET | ORAL | Status: DC

## 2015-04-19 NOTE — Progress Notes (Signed)
Subjective:    Patient ID: [name], female    DOB: 01-09-1935, 80 y.o.   MRN: [id number]  CC: medication followup multiple other med problems f/u  HPI: [name] is a 80 y.o. female presenting for medication followup  Neck still hurts some. Pain with turning to L shoulder or tilting head to either L or R shoulder Heat helps some with neck pain Every day is bothered some by the neck pain  [name] has a cigarettes  Pt has no memory of being told she has Hep B infection, is on her problem list. Last abd imaging was CT scan from 2010, cirrhosis not mentioned  Depression screen [facility] 2/9 04/19/2015 04/06/2015 03/14/2015 08/09/2014 03/20/2014  Decreased Interest 0 0 0 1 0  Down, Depressed, Hopeless 0 0 0 1 0  PHQ - 2 Score 0 0 0 2 0  Altered sleeping - - - 1 -  Tired, decreased energy - - - 1 -  Change in appetite - - - 1 -  Feeling bad or failure about yourself  - - - 0 -  Trouble concentrating - - - 2 -  Moving slowly or fidgety/restless - - - 1 -  Suicidal thoughts - - - 0 -  PHQ-9 Score - - - 8 -     Relevant past medical, surgical, family and social history reviewed and updated as indicated. Interim medical history since our last visit reviewed. Allergies and medications reviewed and updated.    ROS: All systems negative other than what is in HPI  History  Smoking status  . Current Some Day Smoker -- 0.10 packs/day for 40 years  . Types: Cigarettes  Smokeless tobacco  . Never Used    Comment: smokes 2 cigarettes a day    Past Medical History Patient Active Problem List   Diagnosis Date Noted  . COPD (chronic obstructive pulmonary disease) ([place]) 04/19/2015  . Neck pain 04/06/2015  . Insomnia 12/10/2014  . BMI 28.0-28.9,adult 12/10/2014  . Osteopenia 05/19/2014  . GAD (generalized anxiety disorder) 01/06/2014  . Depression 01/06/2014  . GERD (gastroesophageal reflux disease) 01/06/2014  . Vitamin D deficiency 01/06/2014  . Overactive  bladder 01/06/2014  . Secondary cardiomyopathy ([place]) 10/06/2013  . Left bundle branch block 10/06/2013  . Dizziness 10/04/2013  . Osteoarthritis of right hip 03/14/2013  . Allergic rhinitis 12/07/2008  . Chronic cough 12/07/2008  . Hepatitis B virus infection 08/16/2008  . Hyperlipidemia 08/16/2008  . Essential hypertension 08/16/2008  . Osteoarthritis 08/16/2008  . [place] DEGENERATION 07/24/2007    Current Outpatient Prescriptions  Medication Sig Dispense Refill  . albuterol (PROVENTIL HFA;VENTOLIN HFA) 108 (90 BASE) MCG/ACT inhaler Inhale 2 puffs into the lungs every 6 (six) hours as needed for wheezing or shortness of breath. 1 Inhaler 1  . amitriptyline (ELAVIL) 25 MG tablet Take 1 tablet (25 mg total) by mouth at bedtime. 90 tablet 1  . aspirin EC 81 MG tablet Take 81 mg by mouth daily.    . Biotin 5000 MCG CAPS Take 1 capsule by mouth daily.    . budesonide-formoterol (SYMBICORT) 160-4.5 MCG/ACT inhaler Inhale 2 puffs into the lungs 2 (two) times daily. 1 Inhaler 3  . buPROPion (WELLBUTRIN SR) 150 MG 12 hr tablet Take 1 tablet (150 mg total) by mouth 2 (two) times daily. 180 tablet 1  . calcium citrate-vitamin D (CITRACAL+D) 315-200 MG-UNIT per tablet Take 2 tablets by mouth daily.     . carvedilol (COREG) 6.25 MG tablet  Take 6.25 mg by mouth 2 (two) times daily with a meal.    . cetirizine (ZYRTEC) 10 MG tablet Take 1 tablet (10 mg total) by mouth daily. 30 tablet 5  . esomeprazole (NEXIUM) 40 MG capsule Take 1 capsule (40 mg total) by mouth daily before breakfast. 90 capsule 1  . fluticasone (FLONASE) 50 MCG/ACT nasal spray Place 2 sprays into both nostrils daily. 16 g 2  . furosemide (LASIX) 20 MG tablet Take 0.5 tablets (10 mg total) by mouth daily. (Patient taking differently: Take 20 mg by mouth daily. ) 30 tablet 5  . ibuprofen (ADVIL,MOTRIN) 800 MG tablet Take 1 tablet (800 mg total) by mouth every 8 (eight) hours as needed. (Patient taking differently: Take 800 mg by mouth  every 8 (eight) hours as needed for moderate pain. ) 30 tablet 1  . losartan (COZAAR) 50 MG tablet TAKE 1 TABLET (50 MG TOTAL) BY MOUTH DAILY. (Patient taking differently: Take 25 mg by mouth daily. ) 90 tablet 1  . naproxen (NAPROSYN) 500 MG tablet Take 1 tablet (500 mg total) by mouth 2 (two) times daily with a meal. 30 tablet 0  . ondansetron (ZOFRAN) 4 MG tablet TAKE 1 TABLET (4 MG TOTAL) BY MOUTH EVERY 12 (TWELVE) HOURS AS NEEDED FOR NAUSEA. 10 tablet 0  . oxybutynin (DITROPAN-XL) 10 MG 24 hr tablet Take 1 tablet (10 mg total) by mouth daily. 30 tablet 5  . raloxifene (EVISTA) 60 MG tablet Take 1 tablet (60 mg total) by mouth daily. 90 tablet 1  . ranitidine (ZANTAC) 150 MG tablet TAKE ONE TABLET BY MOUTH IN THE EVENING 90 tablet 0  . scopolamine (TRANSDERM-SCOP, 1.5 MG,) 1 MG/3DAYS Place 1 patch (1.5 mg total) onto the skin every 3 (three) days. 10 patch 0  . simvastatin (ZOCOR) 10 MG tablet TAKE 1 TABLET (10 MG TOTAL) BY MOUTH AT BEDTIME. 90 tablet 1  . Spacer/Aero Chamber Mouthpiece MISC Please dispense one spacer for use with inhaler. 1 each 0  . vitamin B-12 (CYANOCOBALAMIN) 1000 MCG tablet Take 500 mcg by mouth daily.     . Vitamin D, Ergocalciferol, (DRISDOL) 50000 UNITS CAPS capsule Take 1 capsule (50,000 Units total) by mouth once a week. (Patient taking differently: Take 50,000 Units by mouth every Monday. ) 12 capsule 6   No current facility-administered medications for this visit.       Objective:    BP 145/79 mmHg  Pulse 84  Temp(Src) 97.7 F (36.5 C) (Oral)  Ht 5' 2"  (1.575 m)  Wt 159 lb 9.6 oz (72.394 kg)  BMI 29.18 kg/m2  Wt Readings from Last 3 Encounters:  04/19/15 159 lb 9.6 oz (72.394 kg)  04/06/15 159 lb 6.4 oz (72.303 kg)  03/14/15 156 lb (70.761 kg)     Gen: NAD, alert, cooperative with exam, NCAT EYES: EOMI, no scleral injection or icterus ENT:  OP without erythema LYMPH: no cervical LAD CV: NRRR, normal S1/S2, no murmur, distal pulses 2+ b/l Resp:  CTABL, no wheezes, normal WOB Abd: +BS, soft, NTND. no guarding or organomegaly Ext: No edema, warm Neuro: Alert and oriented, strength equal b/l UE and LE, coordination grossly normal MSK: decreased ROM with turning neck to L and tilting head to L side     Assessment & Plan:    [name] was seen today for multiple med problem follow up.  Diagnoses and all orders for this visit:  Neck pain Still ongoing, now for six weeks. Tried ROM exercises at home. WIll  get cervical films, refer to PT for continued ROM and strengthening -     DG Cervical Spine Complete; Future -     Ambulatory referral to Physical Therapy  Chronic nausea Occurs a couple times a month and she takes zofran for it with much improvement. isnt sure what brings it on -     ondansetron (ZOFRAN) 4 MG tablet; TAKE 1 TABLET (4 MG TOTAL) BY MOUTH EVERY 12 (TWELVE) HOURS AS NEEDED FOR NAUSEA.  Essential hypertension Adequate control today, continue current meds.  Chronic obstructive pulmonary disease, unspecified COPD type ([employer]) Continue symobicort, albuteorl prn  Hx of type B viral hepatitis Pt not sure if she has hep B or not. Not able to find serologies in Epic. Normal liver on CT scan in 2010. Will get below serologies. -     Hepatitis B core antibody, total -     Hepatitis B surface antibody -     Hepatitis B surface ag-pre vc imm st -     CMP14+EGFR  Hypokalemia -     CMP14+EGFR  Follow up plan: Return in about 3 months (around 07/18/2015) for follow up.  Assunta Found, MD [facility] 04/19/2015, 9:40 PM

## 2015-04-20 LAB — CMP14+EGFR
A/G RATIO: 2 (ref 1.1–2.5)
ALT: 15 IU/L (ref 0–32)
AST: 21 IU/L (ref 0–40)
Albumin: 4.1 g/dL (ref 3.5–4.8)
Alkaline Phosphatase: 103 IU/L (ref 39–117)
BUN/Creatinine Ratio: 12 (ref 11–26)
BUN: 14 mg/dL (ref 8–27)
Bilirubin Total: <0.2 mg/dL (ref 0.0–1.2)
CALCIUM: 9.1 mg/dL (ref 8.7–10.3)
CO2: 25 mmol/L (ref 18–29)
CREATININE: 1.15 mg/dL — AB (ref 0.57–1.00)
Chloride: 107 mmol/L — ABNORMAL HIGH (ref 96–106)
GFR calc Af Amer: 53 mL/min/{1.73_m2} — ABNORMAL LOW (ref >59)
GFR, EST NON AFRICAN AMERICAN: 46 mL/min/{1.73_m2} — AB (ref >59)
GLUCOSE: 90 mg/dL (ref 65–99)
Globulin, Total: 2.1 g/dL (ref 1.5–4.5)
POTASSIUM: 4.4 mmol/L (ref 3.5–5.2)
Sodium: 146 mmol/L — ABNORMAL HIGH (ref 134–144)
Total Protein: 6.2 g/dL (ref 6.0–8.5)

## 2015-04-20 LAB — HEPATITIS B CORE ANTIBODY, TOTAL: HEP B C TOTAL AB: POSITIVE — AB

## 2015-04-20 LAB — HEPATITIS B SURFACE ANTIBODY, QUANTITATIVE

## 2015-04-21 ENCOUNTER — Telehealth: Payer: Self-pay | Admitting: Pediatrics

## 2015-04-21 NOTE — Telephone Encounter (Signed)
Daughter called :  She had a episode on Monday  - she was driving and she got lost.  She happened to call her other daughter and she helped her get where she needed to go. A man / a stranger stopped and helped her  She also mentioned several other things/ times she has said something way out of the way.,   appt made with Oswaldo Done for 12/28 - she can not get here any sooner with the holidays   FYI - Dr Oswaldo Done

## 2015-04-25 ENCOUNTER — Other Ambulatory Visit: Payer: Self-pay | Admitting: Family Medicine

## 2015-04-27 ENCOUNTER — Ambulatory Visit: Payer: Medicare Other | Admitting: Pediatrics

## 2015-04-27 ENCOUNTER — Ambulatory Visit: Payer: Medicare Other | Admitting: Physical Therapy

## 2015-04-28 ENCOUNTER — Encounter: Payer: Self-pay | Admitting: Pediatrics

## 2015-05-04 ENCOUNTER — Ambulatory Visit: Payer: Medicare Other | Admitting: Pediatrics

## 2015-05-04 ENCOUNTER — Encounter: Payer: Self-pay | Admitting: Pediatrics

## 2015-05-04 ENCOUNTER — Ambulatory Visit: Payer: Medicare Other | Attending: Pediatrics | Admitting: Physical Therapy

## 2015-05-04 ENCOUNTER — Ambulatory Visit (INDEPENDENT_AMBULATORY_CARE_PROVIDER_SITE_OTHER): Payer: Medicare Other | Admitting: Pediatrics

## 2015-05-04 VITALS — BP 141/83 | HR 62 | Temp 97.0°F | Ht 61.0 in | Wt 159.4 lb

## 2015-05-04 DIAGNOSIS — J449 Chronic obstructive pulmonary disease, unspecified: Secondary | ICD-10-CM | POA: Diagnosis not present

## 2015-05-04 DIAGNOSIS — Z8619 Personal history of other infectious and parasitic diseases: Secondary | ICD-10-CM | POA: Diagnosis not present

## 2015-05-04 DIAGNOSIS — J45909 Unspecified asthma, uncomplicated: Secondary | ICD-10-CM

## 2015-05-04 DIAGNOSIS — R531 Weakness: Secondary | ICD-10-CM | POA: Diagnosis not present

## 2015-05-04 DIAGNOSIS — R413 Other amnesia: Secondary | ICD-10-CM

## 2015-05-04 DIAGNOSIS — M542 Cervicalgia: Secondary | ICD-10-CM | POA: Diagnosis not present

## 2015-05-04 DIAGNOSIS — M2560 Stiffness of unspecified joint, not elsewhere classified: Secondary | ICD-10-CM | POA: Diagnosis not present

## 2015-05-04 MED ORDER — BUDESONIDE-FORMOTEROL FUMARATE 160-4.5 MCG/ACT IN AERO: 2.0000 | INHALATION_SPRAY | Freq: Two times a day (BID) | RESPIRATORY_TRACT | Status: DC

## 2015-05-04 MED ORDER — ALBUTEROL SULFATE HFA 108 (90 BASE) MCG/ACT IN AERS: 2.0000 | INHALATION_SPRAY | Freq: Four times a day (QID) | RESPIRATORY_TRACT | Status: DC | PRN

## 2015-05-04 NOTE — Patient Instructions (Addendum)
Albuterol 2 puffs AS NEEDED  Symbicort 2 puffs twice a day EVERY DAY

## 2015-05-04 NOTE — Therapy (Signed)
[facility] [address] [place], [place], [address] Phone: [phone]   Fax:  [phone]  Physical Therapy Evaluation  Patient Details  Name: [name] MRN: [id number] Date of Birth: Mar 03, 1935 Referring Provider: Rex Kras, MD  Encounter Date: 05/04/2015      PT End of Session - 05/04/15 1253    Visit Number 1   Number of Visits 12   Date for PT Re-Evaluation 06/15/15   PT Start Time 1310   PT Stop Time 1342   PT Time Calculation (min) 32 min   Activity Tolerance Patient tolerated treatment well   Behavior During Therapy [facility] for tasks assessed/performed      Past Medical History  Diagnosis Date  . Essential hypertension, benign   . Mixed hyperlipidemia   . Depression   . Allergic rhinitis   . GERD (gastroesophageal reflux disease)   . Arthritis   . History of blood transfusion   . Left bundle branch block   . History of cardiac catheterization     Normal coronaries 2010  . Secondary cardiomyopathy (HCC)     LVEF 40-45%, likely nonischemic  . Anxiety   . Osteopenia   . Cataract     had eye surgery    Past Surgical History  Procedure Laterality Date  . Cholecystectomy  1991  . Tonsillectomy    . Appendectomy    . Back surgery  07/21/2008  . Rotator cuff repair  2011    right  . Cataract extraction w/phaco  04/16/2011    Procedure: CATARACT EXTRACTION PHACO AND INTRAOCULAR LENS PLACEMENT (IOC);  Surgeon: Susa Simmonds;  Location: [facility];  Service: Ophthalmology;  Laterality: Left;  CDE=11.35  . Hammer toe surgery  11/22/11    [facility], [name]  . Cataract extraction w/phaco  01/28/2012    Procedure: CATARACT EXTRACTION PHACO AND INTRAOCULAR LENS PLACEMENT (IOC);  Surgeon: Susa Simmonds, MD;  Location: [facility];  Service: Ophthalmology;  Laterality: Right;  CDI:8.15  . Cysto with hydrodistension  05/29/2012    Procedure: CYSTOSCOPY/HYDRODISTENSION;  Surgeon: Martina Sinner, MD;  Location: [facility];  Service: Urology;  Laterality: N/A;  INSTILLATION OF MARCAINE AND PYRIDIUM   . Lumbar laminectomy/decompression microdiscectomy Left 06/06/2012    Procedure: LUMBAR LAMINECTOMY/DECOMPRESSION MICRODISCECTOMY 1 LEVEL;  Surgeon: Maeola Harman, MD;  [facility];  Service: Neurosurgery;  Laterality: Left;  Left Lumbar two-three Laminectomy for resection of synovial cyst  . Total hip arthroplasty Right 03/13/2013    Procedure: TOTAL HIP ARTHROPLASTY;  Surgeon: Nestor Lewandowsky, MD;  Location: [facility];  Service: Orthopedics;  Laterality: Right;  . Eye surgery Bilateral     catar  . Abdominal hysterectomy      complete  . Spine surgery      back suregry 2011  . Joint replacement      right hip replacement 2013    There were no vitals filed for this visit.  Visit Diagnosis:  Neck pain - Plan: PT plan of care cert/re-cert  Weakness - Plan: PT plan of care cert/re-cert  Stiffness of multiple joints - Plan: PT plan of care cert/re-cert      Subjective Assessment - 05/04/15 1313    Subjective Patient reports neck pain starting 6 years ago that has progressively worsened. It is really bad when patient is driving and tries to turn her neck.   Pertinent History HTN, Heart trouble, asthma   Patient Stated Goals Improve strengthe, return to recreational activities  Currently in Pain? Yes   Pain Score 8    Pain Location Neck   Pain Orientation Left;Posterior   Pain Type Chronic pain   Pain Onset More than a month ago   Pain Frequency Constant   Aggravating Factors  turning her head   Pain Relieving Factors heat, meds   Effect of Pain on Daily Activities limited   Multiple Pain Sites No            [facility] PT Assessment - 05/04/15 0001    Assessment   Medical Diagnosis neck pain   Referring Provider Rex Kras, MD   Onset Date/Surgical Date 05/03/09   Next MD Visit today   Precautions   Precaution Comments Rt THA   Balance Screen   Has the patient fallen in the  past 6 months No   Has the patient had a decrease in activity level because of a fear of falling?  No   Is the patient reluctant to leave their home because of a fear of falling?  No   Home Environment   Additional Comments lives with son   Prior Function   Level of Independence Independent   ROM / Strength   AROM / PROM / Strength AROM;Strength   AROM   AROM Assessment Site Cervical   Cervical Flexion WNL   Cervical Extension 13   Cervical - Right Side Bend 25   Cervical - Left Side Bend 38   Cervical - Right Rotation 82   Cervical - Left Rotation 54   Strength   Overall Strength Comments cervical ext 4-/5, flex 4/5, B lat flexion 4+/5   Palpation   Palpation comment Left C5/6, UT   Special Tests    Special Tests Cervical   Cervical Tests Dictraction;other  neg compression   Distraction Test   Findngs Negative                   [facility] Adult PT Treatment/Exercise - 05/04/15 0001    Modalities   Modalities Ultrasound   Ultrasound   Ultrasound Location L UT and cervical paraspinals   Ultrasound Parameters 1.5 Wcm2 1 mhz cont x 8 min   Ultrasound Goals Pain                PT Education - 05/04/15 1346    Education provided Yes   Education Details HEP   Person(s) Educated Patient   Methods Explanation;Demonstration;Handout   Comprehension Verbalized understanding;Returned demonstration             PT Long Term Goals - 05/04/15 1534    PT LONG TERM GOAL #1   Title Improved left cervical rotation to 65 degrees or more to improve ADLS   Time 6   Period Weeks   Status New   PT LONG TERM GOAL #2   Title decreased pain in neck to 4/10 or better with motion.   Time 6   Period Weeks   Status New               Plan - 05/04/15 1524    Clinical Impression Statement Patient presents with long h/o of left sided neck pain. She has decreased motion and strength restricting ADLS including checking blind spots while driving.   Pt will benefit  from skilled therapeutic intervention in order to improve on the following deficits Decreased range of motion;Pain;Decreased activity tolerance;Impaired flexibility;Decreased strength   Rehab Potential Good   PT Frequency 2x / week   PT Duration 6  weeks   PT Treatment/Interventions ADLs/Self Care Home Management;Cryotherapy;Electrical Stimulation;Moist Heat;Therapeutic exercise;Ultrasound;Traction;Neuromuscular re-education;Patient/family education;Manual techniques;Dry needling;Passive range of motion   PT Next Visit Plan STW to L UT/levator scap/neck. manual distraction, cervical and scap stab; modalities prn for pain.   PT Home Exercise Plan UT, Lev scap stretches and scapular retraction   Consulted and Agree with Plan of Care Patient          G-Codes - June 02, 2015 1538    Functional Assessment Tool Used FOTO 66% LIMITED   Functional Limitation Other PT primary   Other PT Primary Current Status (U9323) At least 60 percent but less than 80 percent impaired, limited or restricted   Other PT Primary Goal Status (F5732) At least 40 percent but less than 60 percent impaired, limited or restricted       Problem List Patient Active Problem List   Diagnosis Date Noted  . COPD (chronic obstructive pulmonary disease) (HCC) 04/19/2015  . Neck pain 04/06/2015  . Insomnia 12/10/2014  . BMI 28.0-28.9,adult 12/10/2014  . Osteopenia 05/19/2014  . GAD (generalized anxiety disorder) 01/06/2014  . Depression 01/06/2014  . GERD (gastroesophageal reflux disease) 01/06/2014  . Vitamin D deficiency 01/06/2014  . Overactive bladder 01/06/2014  . Secondary cardiomyopathy (HCC) 10/06/2013  . Left bundle branch block 10/06/2013  . Dizziness 10/04/2013  . Osteoarthritis of right hip 03/14/2013  . Allergic rhinitis 12/07/2008  . Chronic cough 12/07/2008  . Hepatitis B virus infection 08/16/2008  . Hyperlipidemia 08/16/2008  . Essential hypertension 08/16/2008  . Osteoarthritis 08/16/2008  . DISC  DEGENERATION 07/24/2007    Solon Palm PT  2015/06/02, 3:44 PM  [facility] [address] [place], [place], [address] Phone: [phone]   Fax:  [phone]  Name: DALA BREAULT MRN: [id number] Date of Birth: 24-Feb-1935

## 2015-05-04 NOTE — Progress Notes (Signed)
Subjective:    Patient ID: [name], female    DOB: May 25, 1934, 81 y.o.   MRN: [id number]  CC: Follow-up multiple med problems Memory problems  HPI: [name] is a 81 y.o. female presenting for Follow-up  Had one episode of getting lost while driving home from [employer], took a wrong turn and had to stop and ask for directions, got on the bypass instead of going usual way home.  Has never happened before.  Does sometimes misplace things at home  Breathing is doing well  MMSE score of 27 today, missed points for spelling world backwards  No headaches or vision changes, no chest pain. Feeling overall well Her daughters check on her regularly. She texts one of them before she leaves the hosue if she is driving and when she gets there. Find my friends app is on on her phone so family knows where she is.  Depression screen [facility] 2/9 05/04/2015 04/19/2015 04/06/2015 03/14/2015 08/09/2014  Decreased Interest 0 0 0 0 1  Down, Depressed, Hopeless 0 0 0 0 1  PHQ - 2 Score 0 0 0 0 2  Altered sleeping - - - - 1  Tired, decreased energy - - - - 1  Change in appetite - - - - 1  Feeling bad or failure about yourself  - - - - 0  Trouble concentrating - - - - 2  Moving slowly or fidgety/restless - - - - 1  Suicidal thoughts - - - - 0  PHQ-9 Score - - - - 8     Relevant past medical, surgical, family and social history reviewed and updated as indicated. Interim medical history since our last visit reviewed. Allergies and medications reviewed and updated.    ROS: Per HPI unless specifically indicated above  History  Smoking status  . Current Some Day Smoker -- 0.10 packs/day for 40 years  . Types: Cigarettes  Smokeless tobacco  . Never Used    Comment: smokes 2 cigarettes a day    Past Medical History Patient Active Problem List   Diagnosis Date Noted  . COPD (chronic obstructive pulmonary disease) (HCC) 04/19/2015  . Neck pain 04/06/2015  . Insomnia 12/10/2014  .  BMI 28.0-28.9,adult 12/10/2014  . Osteopenia 05/19/2014  . GAD (generalized anxiety disorder) 01/06/2014  . Depression 01/06/2014  . GERD (gastroesophageal reflux disease) 01/06/2014  . Vitamin D deficiency 01/06/2014  . Overactive bladder 01/06/2014  . Secondary cardiomyopathy (HCC) 10/06/2013  . Left bundle branch block 10/06/2013  . Dizziness 10/04/2013  . Osteoarthritis of right hip 03/14/2013  . Allergic rhinitis 12/07/2008  . Chronic cough 12/07/2008  . Hepatitis B virus infection 08/16/2008  . Hyperlipidemia 08/16/2008  . Essential hypertension 08/16/2008  . Osteoarthritis 08/16/2008  . DISC DEGENERATION 07/24/2007    Current Outpatient Prescriptions  Medication Sig Dispense Refill  . albuterol (PROVENTIL HFA;VENTOLIN HFA) 108 (90 Base) MCG/ACT inhaler Inhale 2 puffs into the lungs every 6 (six) hours as needed for wheezing or shortness of breath. 1 Inhaler 1  . amitriptyline (ELAVIL) 25 MG tablet Take 1 tablet (25 mg total) by mouth at bedtime. 90 tablet 1  . aspirin EC 81 MG tablet Take 81 mg by mouth daily.    . Biotin 5000 MCG CAPS Take 1 capsule by mouth daily.    . budesonide-formoterol (SYMBICORT) 160-4.5 MCG/ACT inhaler Inhale 2 puffs into the lungs 2 (two) times daily. 1 Inhaler 3  . buPROPion (WELLBUTRIN SR) 150 MG 12 hr tablet  Take 1 tablet (150 mg total) by mouth 2 (two) times daily. 180 tablet 1  . calcium citrate-vitamin D (CITRACAL+D) 315-200 MG-UNIT per tablet Take 2 tablets by mouth daily.     . carvedilol (COREG) 6.25 MG tablet Take 6.25 mg by mouth 2 (two) times daily with a meal.    . cetirizine (ZYRTEC) 10 MG tablet Take 1 tablet (10 mg total) by mouth daily. 30 tablet 5  . esomeprazole (NEXIUM) 40 MG capsule Take 1 capsule (40 mg total) by mouth daily before breakfast. 90 capsule 1  . fluticasone (FLONASE) 50 MCG/ACT nasal spray Place 2 sprays into both nostrils daily. 16 g 2  . furosemide (LASIX) 20 MG tablet Take 0.5 tablets (10 mg total) by mouth  daily. (Patient taking differently: Take 20 mg by mouth daily. ) 30 tablet 5  . ibuprofen (ADVIL,MOTRIN) 800 MG tablet TAKE  ONE TABLET BY MOUTH EVERY 8 HOURS AS NEEDED 30 tablet 2  . losartan (COZAAR) 50 MG tablet TAKE 1 TABLET (50 MG TOTAL) BY MOUTH DAILY. (Patient taking differently: Take 25 mg by mouth daily. ) 90 tablet 1  . naproxen (NAPROSYN) 500 MG tablet Take 1 tablet (500 mg total) by mouth 2 (two) times daily with a meal. 30 tablet 0  . ondansetron (ZOFRAN) 4 MG tablet TAKE 1 TABLET (4 MG TOTAL) BY MOUTH EVERY 12 (TWELVE) HOURS AS NEEDED FOR NAUSEA. 10 tablet 0  . oxybutynin (DITROPAN-XL) 10 MG 24 hr tablet Take 1 tablet (10 mg total) by mouth daily. 30 tablet 5  . raloxifene (EVISTA) 60 MG tablet Take 1 tablet (60 mg total) by mouth daily. 90 tablet 1  . ranitidine (ZANTAC) 150 MG tablet TAKE ONE TABLET BY MOUTH IN THE EVENING 90 tablet 0  . simvastatin (ZOCOR) 10 MG tablet TAKE 1 TABLET (10 MG TOTAL) BY MOUTH AT BEDTIME. 90 tablet 1  . Spacer/Aero Chamber Mouthpiece MISC Please dispense one spacer for use with inhaler. 1 each 0  . vitamin B-12 (CYANOCOBALAMIN) 1000 MCG tablet Take 500 mcg by mouth daily.     . Vitamin D, Ergocalciferol, (DRISDOL) 50000 UNITS CAPS capsule Take 1 capsule (50,000 Units total) by mouth once a week. (Patient taking differently: Take 50,000 Units by mouth every Monday. ) 12 capsule 6  . scopolamine (TRANSDERM-SCOP, 1.5 MG,) 1 MG/3DAYS Place 1 patch (1.5 mg total) onto the skin every 3 (three) days. (Patient not taking: Reported on 05/04/2015) 10 patch 0   No current facility-administered medications for this visit.       Objective:    BP 141/83 mmHg  Pulse 62  Temp(Src) 97 F (36.1 C) (Oral)  Ht 5\' 1"  (1.549 m)  Wt 159 lb 6.4 oz (72.303 kg)  BMI 30.13 kg/m2  Wt Readings from Last 3 Encounters:  05/04/15 159 lb 6.4 oz (72.303 kg)  04/19/15 159 lb 9.6 oz (72.394 kg)  04/06/15 159 lb 6.4 oz (72.303 kg)    Gen: NAD, alert, cooperative with exam,  NCAT EYES: EOMI, no scleral injection or icterus ENT:  OP without erythema LYMPH: no cervical LAD CV: NRRR, normal S1/S2, no murmur, distal pulses 2+ b/l Resp: CTABL, no wheezes, normal WOB Abd: +BS, soft, NTND. no guarding or organomegaly Ext: No edema, warm Neuro: Alert and oriented     Assessment & Plan:    Sherryann was seen today for follow-up med problems and memory problems.  Diagnoses and all orders for this visit:  Chronic obstructive pulmonary disease, unspecified COPD type (HCC) Well  controlled, cont meds, let me know if symptoms worsen. -     albuterol (PROVENTIL HFA;VENTOLIN HFA) 108 (90 Base) MCG/ACT inhaler; Inhale 2 puffs into the lungs every 6 (six) hours as needed for wheezing or shortness of breath.  Asthma, chronic, unspecified asthma severity, uncomplicated Breathing well controlled now, continue current meds. -     budesonide-formoterol (SYMBICORT) 160-4.5 MCG/ACT inhaler; Inhale 2 puffs into the lungs 2 (two) times daily.  H/O type B viral hepatitis Surface Ag negative. H/o hep B that has now cleared -     Hepatitis B surface antigen  Memory Problem  Episode of getting lsot while dirivng. Still taking care of all her own activities at home, medications. Daugthers checking in on her regularly. Feels safe at home. MMSE 27 today, will ctm.   Follow up plan: Return in about 11 weeks (around 07/20/2015).  Rex Kras, MD [facility] 05/04/2015, 2:12 PM

## 2015-05-04 NOTE — Patient Instructions (Signed)
  Flexibility: Upper Trapezius Stretch   Gently grasp right side of head while reaching behind back with other hand. Tilt head away until a gentle stretch is felt. Hold 30 seconds. Repeat 3 times per set. Do 2 sessions per day.  http://orth.exer.us/340   Levator Stretch   Grasp seat or sit on hand on side to be stretched. Turn head toward other side and look down. Use hand on head to gently stretch neck in that position. Hold _30___ seconds. Repeat on other side. Repeat 3 times. Do 2 sessions per day.  http://gt2.exer.us/30   Scapular Retraction (Standing)   With arms at sides, pinch shoulder blades together. Repeat 10 times per set. Do 1-3 sets per session. Do 2 sessions per day.  http://orth.exer.NT/614   Solon Palm, PT 05/04/2015 1:42 PM [facility] [address] [place], [place], [address] Phone: [phone]   Fax:  [phone]

## 2015-05-05 LAB — HEPATITIS B SURFACE ANTIGEN: Hepatitis B Surface Ag: NEGATIVE

## 2015-05-07 DIAGNOSIS — R413 Other amnesia: Secondary | ICD-10-CM | POA: Insufficient documentation

## 2015-05-09 ENCOUNTER — Encounter: Payer: Medicare Other | Admitting: Physical Therapy

## 2015-05-11 ENCOUNTER — Encounter: Payer: Medicare Other | Admitting: Physical Therapy

## 2015-05-14 ENCOUNTER — Other Ambulatory Visit: Payer: Self-pay | Admitting: Cardiology

## 2015-05-16 ENCOUNTER — Encounter: Payer: Self-pay | Admitting: Physical Therapy

## 2015-05-16 ENCOUNTER — Ambulatory Visit: Payer: Medicare Other | Admitting: Physical Therapy

## 2015-05-16 DIAGNOSIS — R531 Weakness: Secondary | ICD-10-CM | POA: Diagnosis not present

## 2015-05-16 DIAGNOSIS — M542 Cervicalgia: Secondary | ICD-10-CM

## 2015-05-16 DIAGNOSIS — M2560 Stiffness of unspecified joint, not elsewhere classified: Secondary | ICD-10-CM | POA: Diagnosis not present

## 2015-05-16 NOTE — Telephone Encounter (Signed)
Rx request sent to pharmacy.  

## 2015-05-16 NOTE — Therapy (Signed)
[facility] [address] [place], [place], [address] Phone: [phone]   Fax:  [phone]  Physical Therapy Treatment  Patient Details  Name: [name] MRN: [id number] Date of Birth: 01/11/1935 Referring Provider: Rex Kras, MD  Encounter Date: 05/16/2015      PT End of Session - 05/16/15 0731    Visit Number 2   Number of Visits 12   Date for PT Re-Evaluation 06/15/15   PT Start Time 0732   PT Stop Time 0810   PT Time Calculation (min) 38 min   Activity Tolerance Patient tolerated treatment well   Behavior During Therapy [facility] for tasks assessed/performed      Past Medical History  Diagnosis Date  . Essential hypertension, benign   . Mixed hyperlipidemia   . Depression   . Allergic rhinitis   . GERD (gastroesophageal reflux disease)   . Arthritis   . History of blood transfusion   . Left bundle branch block   . History of cardiac catheterization     Normal coronaries 2010  . Secondary cardiomyopathy (HCC)     LVEF 40-45%, likely nonischemic  . Anxiety   . Osteopenia   . Cataract     had eye surgery    Past Surgical History  Procedure Laterality Date  . Cholecystectomy  1991  . Tonsillectomy    . Appendectomy    . Back surgery  07/21/2008  . Rotator cuff repair  2011    right  . Cataract extraction w/phaco  04/16/2011    Procedure: CATARACT EXTRACTION PHACO AND INTRAOCULAR LENS PLACEMENT (IOC);  Surgeon: Susa Simmonds;  Location: [facility];  Service: Ophthalmology;  Laterality: Left;  CDE=11.35  . Hammer toe surgery  11/22/11    [facility], [name]  . Cataract extraction w/phaco  01/28/2012    Procedure: CATARACT EXTRACTION PHACO AND INTRAOCULAR LENS PLACEMENT (IOC);  Surgeon: Susa Simmonds, MD;  Location: [facility];  Service: Ophthalmology;  Laterality: Right;  CDI:8.15  . Cysto with hydrodistension  05/29/2012    Procedure: CYSTOSCOPY/HYDRODISTENSION;  Surgeon: Martina Sinner, MD;  Location: [facility];  Service: Urology;  Laterality: N/A;  INSTILLATION OF MARCAINE AND PYRIDIUM   . Lumbar laminectomy/decompression microdiscectomy Left 06/06/2012    Procedure: LUMBAR LAMINECTOMY/DECOMPRESSION MICRODISCECTOMY 1 LEVEL;  Surgeon: Maeola Harman, MD;  Location: [facility];  Service: Neurosurgery;  Laterality: Left;  Left Lumbar two-three Laminectomy for resection of synovial cyst  . Total hip arthroplasty Right 03/13/2013    Procedure: TOTAL HIP ARTHROPLASTY;  Surgeon: Nestor Lewandowsky, MD;  Location: [facility];  Service: Orthopedics;  Laterality: Right;  . Eye surgery Bilateral     catar  . Abdominal hysterectomy      complete  . Spine surgery      back suregry 2011  . Joint replacement      right hip replacement 2013    There were no vitals filed for this visit.  Visit Diagnosis:  Neck pain  Weakness      Subjective Assessment - 05/16/15 0731    Subjective Reports that her neck is stil tight. With rotation to L pain is 10/10. States that at times when she moves her neck she hears a pop sound.   Pertinent History HTN, Heart trouble, asthma   Patient Stated Goals Improve strengthe, return to recreational activities   Currently in Pain? Yes   Pain Score 8    Pain Location Neck   Pain Orientation Left;Posterior  Pain Descriptors / Indicators Tightness   Pain Type Chronic pain   Pain Onset More than a month ago   Pain Frequency Constant            [facility] PT Assessment - 05/16/15 0001    Assessment   Medical Diagnosis neck pain   Onset Date/Surgical Date 05/03/09   Precautions   Precaution Comments Rt THA                     [facility] Adult PT Treatment/Exercise - 05/16/15 0001    Modalities   Modalities Ultrasound   Ultrasound   Ultrasound Location L UT/ cervical paraspinals   Ultrasound Parameters 1.5 w/cm2, 100%,1 mhz x12 min   Ultrasound Goals Pain   Manual Therapy   Manual Therapy Myofascial release   Myofascial Release MFR to L UT/ Levator  Scapula/ cervical paraspinals in sitting to decrease pain and tightness                     PT Long Term Goals - 05/04/15 1534    PT LONG TERM GOAL #1   Title Improved left cervical rotation to 65 degrees or more to improve ADLS   Time 6   Period Weeks   Status New   PT LONG TERM GOAL #2   Title decreased pain in neck to 4/10 or better with motion.   Time 6   Period Weeks   Status New               Plan - 05/16/15 5462    Clinical Impression Statement Patient tolerated today's treatment well with only reports of some tenderness to palpation in L cervical musculature compared to R cervical musculature. Increased tightness was noted in L UT/ Levator Scapula musculature today with no reports of pain or tenderness with manual therapy. Patient reported complaince with HEP given at evaluation. Goals remain on-going secondary to decreased cervical ROM and increased cervical pain. Normal [place] response noted following end of the [place] session. Experienced decreased tightness following today's treatment per patient report.   Pt will benefit from skilled therapeutic intervention in order to improve on the following deficits Decreased range of motion;Pain;Decreased activity tolerance;Impaired flexibility;Decreased strength   Rehab Potential Good   PT Frequency 2x / week   PT Duration 6 weeks   PT Treatment/Interventions ADLs/Self Care Home Management;Cryotherapy;Electrical Stimulation;Moist Heat;Therapeutic exercise;Ultrasound;Traction;Neuromuscular re-education;Patient/family education;Manual techniques;Dry needling;Passive range of motion   PT Next Visit Plan STW to L UT/levator scap/neck. manual distraction, cervical and scap stab; modalities prn for pain.   PT Home Exercise Plan UT, Lev scap stretches and scapular retraction   Consulted and Agree with Plan of Care Patient        Problem List Patient Active Problem List   Diagnosis Date Noted  . Memory problem 05/07/2015  .  COPD (chronic obstructive pulmonary disease) (HCC) 04/19/2015  . Neck pain 04/06/2015  . Insomnia 12/10/2014  . BMI 28.0-28.9,adult 12/10/2014  . Osteopenia 05/19/2014  . GAD (generalized anxiety disorder) 01/06/2014  . Depression 01/06/2014  . GERD (gastroesophageal reflux disease) 01/06/2014  . Vitamin D deficiency 01/06/2014  . Overactive bladder 01/06/2014  . Secondary cardiomyopathy (HCC) 10/06/2013  . Left bundle branch block 10/06/2013  . Dizziness 10/04/2013  . Osteoarthritis of right hip 03/14/2013  . Allergic rhinitis 12/07/2008  . Chronic cough 12/07/2008  . Hepatitis B virus infection 08/16/2008  . Hyperlipidemia 08/16/2008  . Essential hypertension 08/16/2008  . Osteoarthritis 08/16/2008  . DISC  DEGENERATION 07/24/2007    Evelene Croon, PTA 05/16/2015, 8:23 AM  [facility] [address] [place], [place], [address] Phone: [phone]   Fax:  [phone]  Name: [name] MRN: [id number] Date of Birth: 07-23-1934

## 2015-05-20 ENCOUNTER — Encounter: Payer: Self-pay | Admitting: Physical Therapy

## 2015-05-20 ENCOUNTER — Ambulatory Visit: Payer: Medicare Other | Admitting: Physical Therapy

## 2015-05-20 DIAGNOSIS — M542 Cervicalgia: Secondary | ICD-10-CM | POA: Diagnosis not present

## 2015-05-20 DIAGNOSIS — R531 Weakness: Secondary | ICD-10-CM

## 2015-05-20 DIAGNOSIS — M2560 Stiffness of unspecified joint, not elsewhere classified: Secondary | ICD-10-CM

## 2015-05-20 NOTE — Therapy (Signed)
[facility] [address] [place], [place], [address] Phone: [phone]   Fax:  [phone]  Physical Therapy Treatment  Patient Details  Name: [name] MRN: [id number] Date of Birth: [birth year] Referring Provider: Rex Kras, MD  Encounter Date: 05/20/2015      PT End of Session - 05/20/15 0816    Visit Number 3   Number of Visits 12   Date for PT Re-Evaluation 06/15/15   PT Start Time 0731   PT Stop Time 0810   PT Time Calculation (min) 39 min   Activity Tolerance Patient tolerated treatment well   Behavior During Therapy [facility] for tasks assessed/performed      Past Medical History  Diagnosis Date  . Essential hypertension, benign   . Mixed hyperlipidemia   . Depression   . Allergic rhinitis   . GERD (gastroesophageal reflux disease)   . Arthritis   . History of blood transfusion   . Left bundle branch block   . History of cardiac catheterization     Normal coronaries 2010  . Secondary cardiomyopathy (HCC)     LVEF 40-45%, likely nonischemic  . Anxiety   . Osteopenia   . Cataract     had eye surgery    Past Surgical History  Procedure Laterality Date  . Cholecystectomy  1991  . Tonsillectomy    . Appendectomy    . Back surgery  07/21/2008  . Rotator cuff repair  2011    right  . Cataract extraction w/phaco  04/16/2011    Procedure: CATARACT EXTRACTION PHACO AND INTRAOCULAR LENS PLACEMENT (IOC);  Surgeon: Susa Simmonds;  Location: [facility];  Service: Ophthalmology;  Laterality: Left;  CDE=11.35  . Hammer toe surgery  11/22/11    [facility], [name]  . Cataract extraction w/phaco  01/28/2012    Procedure: CATARACT EXTRACTION PHACO AND INTRAOCULAR LENS PLACEMENT (IOC);  Surgeon: Susa Simmonds, MD;  Location: [facility];  Service: Ophthalmology;  Laterality: Right;  CDI:8.15  . Cysto with hydrodistension  05/29/2012    Procedure: CYSTOSCOPY/HYDRODISTENSION;  Surgeon: Martina Sinner, MD;  Location: [facility];  Service: Urology;  Laterality: N/A;  INSTILLATION OF MARCAINE AND PYRIDIUM   . Lumbar laminectomy/decompression microdiscectomy Left 06/06/2012    Procedure: LUMBAR LAMINECTOMY/DECOMPRESSION MICRODISCECTOMY 1 LEVEL;  Surgeon: Maeola Harman, MD;  [facility]: [facility];  Service: Neurosurgery;  Laterality: Left;  Left Lumbar two-three Laminectomy for resection of synovial cyst  . Total hip arthroplasty Right 03/13/2013    Procedure: TOTAL HIP ARTHROPLASTY;  Surgeon: Nestor Lewandowsky, MD;  Location: [facility];  Service: Orthopedics;  Laterality: Right;  . Eye surgery Bilateral     catar  . Abdominal hysterectomy      complete  . Spine surgery      back suregry 2011  . Joint replacement      right hip replacement 2013    There were no vitals filed for this visit.  Visit Diagnosis:  Neck pain  Weakness  Stiffness of multiple joints      Subjective Assessment - 05/20/15 0816    Subjective Reports her neck is stiffness today with the rain. States that pain is more with rotation to the left.   Pertinent History HTN, Heart trouble, asthma   Patient Stated Goals Improve strengthe, return to recreational activities   Currently in Pain? Yes   Pain Score 7    Pain Location Neck   Pain Orientation Left   Pain Descriptors / Indicators  Tightness;Other (Comment)  Stiffness   Pain Type Chronic pain   Pain Onset More than a month ago            [facility] PT Assessment - 05/20/15 0001    Assessment   Medical Diagnosis neck pain   Onset Date/Surgical Date 05/03/09   Precautions   Precaution Comments Rt THA                     [facility] Adult PT Treatment/Exercise - 05/20/15 0001    Modalities   Modalities Ultrasound   Ultrasound   Ultrasound Location L UT/ cervical paraspinals   Ultrasound Parameters 1.5 w/cm2 ,100%,1 mhz x10 min   Ultrasound Goals Pain   Manual Therapy   Manual Therapy Myofascial release   Myofascial Release MFR to L UT/ Levator Scapula/ cervical  paraspinals in sitting to decrease pain and tightness                     PT Long Term Goals - 05/04/15 1534    PT LONG TERM GOAL #1   Title Improved left cervical rotation to 65 degrees or more to improve ADLS   Time 6   Period Weeks   Status New   PT LONG TERM GOAL #2   Title decreased pain in neck to 4/10 or better with motion.   Time 6   Period Weeks   Status New               Plan - 05/20/15 0820    Clinical Impression Statement Patient tolerated today's treatment well with no reports of pain with palpation or manual therapy today. Tightness was noted in the L UT, Levator Scapula, cervical paraspinals and patient reported popping sensation with cervical extension. Normal modalities response noted following end of the [place] session. Goals remain on-going secondary to decreased ROM and increased cervical pain. Experienced decreased cervical tightness following today's treatment per report by patient.   Pt will benefit from skilled therapeutic intervention in order to improve on the following deficits Decreased range of motion;Pain;Decreased activity tolerance;Impaired flexibility;Decreased strength   Rehab Potential Good   PT Frequency 2x / week   PT Duration 6 weeks   PT Treatment/Interventions ADLs/Self Care Home Management;Cryotherapy;Electrical Stimulation;Moist Heat;Therapeutic exercise;Ultrasound;Traction;Neuromuscular re-education;Patient/family education;Manual techniques;Dry needling;Passive range of motion   PT Next Visit Plan STW to L UT/levator scap/neck. manual distraction, cervical and scap stab; modalities prn for pain.   PT Home Exercise Plan UT, Lev scap stretches and scapular retraction   Consulted and Agree with Plan of Care Patient        Problem List Patient Active Problem List   Diagnosis Date Noted  . Memory problem 05/07/2015  . COPD (chronic obstructive pulmonary disease) (HCC) 04/19/2015  . Neck pain 04/06/2015  . Insomnia  12/10/2014  . BMI 28.0-28.9,adult 12/10/2014  . Osteopenia 05/19/2014  . GAD (generalized anxiety disorder) 01/06/2014  . Depression 01/06/2014  . GERD (gastroesophageal reflux disease) 01/06/2014  . Vitamin D deficiency 01/06/2014  . Overactive bladder 01/06/2014  . Secondary cardiomyopathy (HCC) 10/06/2013  . Left bundle branch block 10/06/2013  . Dizziness 10/04/2013  . Osteoarthritis of right hip 03/14/2013  . Allergic rhinitis 12/07/2008  . Chronic cough 12/07/2008  . Hepatitis B virus infection 08/16/2008  . Hyperlipidemia 08/16/2008  . Essential hypertension 08/16/2008  . Osteoarthritis 08/16/2008  . DISC DEGENERATION 07/24/2007    Evelene Croon, PTA 05/20/2015, 8:24 AM  [facility] [address]  [address] [place], [place], [address] Phone: [phone]   Fax:  [phone]  Name: [name] MRN: [id number] Date of Birth: 1934-07-12

## 2015-05-23 ENCOUNTER — Ambulatory Visit: Payer: Medicare Other | Admitting: Physical Therapy

## 2015-05-24 ENCOUNTER — Other Ambulatory Visit: Payer: Self-pay | Admitting: Family Medicine

## 2015-05-26 ENCOUNTER — Ambulatory Visit: Payer: Medicare Other | Admitting: Physical Therapy

## 2015-05-26 ENCOUNTER — Telehealth: Payer: Self-pay

## 2015-05-26 ENCOUNTER — Encounter: Payer: Self-pay | Admitting: Physical Therapy

## 2015-05-26 DIAGNOSIS — M542 Cervicalgia: Secondary | ICD-10-CM | POA: Diagnosis not present

## 2015-05-26 DIAGNOSIS — M2560 Stiffness of unspecified joint, not elsewhere classified: Secondary | ICD-10-CM | POA: Diagnosis not present

## 2015-05-26 DIAGNOSIS — R531 Weakness: Secondary | ICD-10-CM | POA: Diagnosis not present

## 2015-05-26 NOTE — Telephone Encounter (Signed)
Patient wants a referral to PT next door for her neck

## 2015-05-26 NOTE — Therapy (Signed)
[facility] [address] [place], [place], [address] Phone: [phone]   Fax:  [phone]  Physical Therapy Treatment  Patient Details  Name: [name] MRN: [id number] Date of Birth: 1934-07-03 Referring Provider: Rex Kras, MD  Encounter Date: 05/26/2015      PT End of Session - 05/26/15 1510    Visit Number 4   Number of Visits 12   Date for PT Re-Evaluation 06/15/15   PT Start Time 1433   PT Stop Time 1516   PT Time Calculation (min) 43 min   Activity Tolerance Patient tolerated treatment well   Behavior During Therapy [facility] for tasks assessed/performed      Past Medical History  Diagnosis Date  . Essential hypertension, benign   . Mixed hyperlipidemia   . Depression   . Allergic rhinitis   . GERD (gastroesophageal reflux disease)   . Arthritis   . History of blood transfusion   . Left bundle branch block   . History of cardiac catheterization     Normal coronaries 2010  . Secondary cardiomyopathy (HCC)     LVEF 40-45%, likely nonischemic  . Anxiety   . Osteopenia   . Cataract     had eye surgery    Past Surgical History  Procedure Laterality Date  . Cholecystectomy  1991  . Tonsillectomy    . Appendectomy    . Back surgery  07/21/2008  . Rotator cuff repair  2011    right  . Cataract extraction w/phaco  04/16/2011    Procedure: CATARACT EXTRACTION PHACO AND INTRAOCULAR LENS PLACEMENT (IOC);  Surgeon: Susa Simmonds;  Location: [facility];  Service: Ophthalmology;  Laterality: Left;  CDE=11.35  . Hammer toe surgery  11/22/11    [facility], [name]  . Cataract extraction w/phaco  01/28/2012    Procedure: CATARACT EXTRACTION PHACO AND INTRAOCULAR LENS PLACEMENT (IOC);  Surgeon: Susa Simmonds, MD;  Location: [facility];  Service: Ophthalmology;  Laterality: Right;  CDI:8.15  . Cysto with hydrodistension  05/29/2012    Procedure: CYSTOSCOPY/HYDRODISTENSION;  Surgeon: Martina Sinner, MD;  Location: [facility];  Service: Urology;  Laterality: N/A;  INSTILLATION OF MARCAINE AND PYRIDIUM   . Lumbar laminectomy/decompression microdiscectomy Left 06/06/2012    Procedure: LUMBAR LAMINECTOMY/DECOMPRESSION MICRODISCECTOMY 1 LEVEL;  Surgeon: Maeola Harman, MD;  Location: [facility];  Service: Neurosurgery;  Laterality: Left;  Left Lumbar two-three Laminectomy for resection of synovial cyst  . Total hip arthroplasty Right 03/13/2013    Procedure: TOTAL HIP ARTHROPLASTY;  Surgeon: Nestor Lewandowsky, MD;  Location: [facility];  Service: Orthopedics;  Laterality: Right;  . Eye surgery Bilateral     catar  . Abdominal hysterectomy      complete  . Spine surgery      back suregry 2011  . Joint replacement      right hip replacement 2013    There were no vitals filed for this visit.  Visit Diagnosis:  Neck pain  Weakness      Subjective Assessment - 05/26/15 1509    Subjective Patient reports fatigue and that she had to work today.   Pertinent History HTN, Heart trouble, asthma   Patient Stated Goals Improve strengthe, return to recreational activities   Currently in Pain? Yes   Pain Score 6    Pain Location Neck   Pain Orientation Left   Pain Descriptors / Indicators Throbbing   Pain Type Chronic pain   Pain Onset More than a  month ago            [facility] PT Assessment - 05/26/15 0001    Assessment   Medical Diagnosis neck pain   Onset Date/Surgical Date 05/03/09   Precautions   Precaution Comments Rt THA                     [facility] Adult PT Treatment/Exercise - 05/26/15 0001    Modalities   Modalities Electrical Stimulation;Moist Heat;Ultrasound   Moist Heat Therapy   Number Minutes Moist Heat 15 Minutes   Moist Heat Location Cervical   Electrical Stimulation   Electrical Stimulation Location L UT   Electrical Stimulation Action Pre-Mod   Electrical Stimulation Parameters 80-150 Hz x15 min   Electrical Stimulation Goals Pain   Ultrasound   Ultrasound Location L  UT/ Levator Scapula   Ultrasound Parameters 1.5 w/cm2, 100%, 1 mhz x10 min   Ultrasound Goals Pain   Manual Therapy   Manual Therapy Myofascial release   Myofascial Release MFR to L UT/ Levator Scapula/ cervical paraspinals in sitting to decrease pain and tightness                     PT Long Term Goals - 05/04/15 1534    PT LONG TERM GOAL #1   Title Improved left cervical rotation to 65 degrees or more to improve ADLS   Time 6   Period Weeks   Status New   PT LONG TERM GOAL #2   Title decreased pain in neck to 4/10 or better with motion.   Time 6   Period Weeks   Status New               Plan - 05/26/15 1511    Clinical Impression Statement Patient tolerated today's treatment well with only one instance of sharp pain with manual therapy around superiomedial angle of the L scapula but pain was not reproduced during the rest of manual therapy. Patient demonstrated increased tightness in the L levator scapula region but decreased palpable tightness noted in L levator scapula following manual therapy. Normal modalities response noted following removal of the modalities. Goals remain on-going secondary to decreased cervical ROM and increased pain per patient report. Patient reported cervical musculature feeling "much better" following today's treatment.   Pt will benefit from skilled therapeutic intervention in order to improve on the following deficits Decreased range of motion;Pain;Decreased activity tolerance;Impaired flexibility;Decreased strength   Rehab Potential Good   PT Frequency 2x / week   PT Duration 6 weeks   PT Treatment/Interventions ADLs/Self Care Home Management;Cryotherapy;Electrical Stimulation;Moist Heat;Therapeutic exercise;Ultrasound;Traction;Neuromuscular re-education;Patient/family education;Manual techniques;Dry needling;Passive range of motion   PT Next Visit Plan STW to L UT/levator scap/neck. manual distraction, cervical and scap stab;  modalities prn for pain.   PT Home Exercise Plan UT, Lev scap stretches and scapular retraction   Consulted and Agree with Plan of Care Patient        Problem List Patient Active Problem List   Diagnosis Date Noted  . Memory problem 05/07/2015  . COPD (chronic obstructive pulmonary disease) (HCC) 04/19/2015  . Neck pain 04/06/2015  . Insomnia 12/10/2014  . BMI 28.0-28.9,adult 12/10/2014  . Osteopenia 05/19/2014  . GAD (generalized anxiety disorder) 01/06/2014  . Depression 01/06/2014  . GERD (gastroesophageal reflux disease) 01/06/2014  . Vitamin D deficiency 01/06/2014  . Overactive bladder 01/06/2014  . Secondary cardiomyopathy (HCC) 10/06/2013  . Left bundle branch block 10/06/2013  . Dizziness 10/04/2013  . Osteoarthritis  of right hip 03/14/2013  . Allergic rhinitis 12/07/2008  . Chronic cough 12/07/2008  . Hepatitis B virus infection 08/16/2008  . Hyperlipidemia 08/16/2008  . Essential hypertension 08/16/2008  . Osteoarthritis 08/16/2008  . DISC DEGENERATION 07/24/2007    Evelene Croon, PTA 05/26/2015, 3:20 PM  [facility] [address] [place], [place], [address] Phone: [phone]   Fax:  [phone]  Name: [name] MRN: [id number] Date of Birth: Sep 26, 1934

## 2015-05-28 NOTE — Telephone Encounter (Signed)
Order placed

## 2015-05-30 ENCOUNTER — Ambulatory Visit: Payer: Medicare Other | Admitting: Physical Therapy

## 2015-05-30 ENCOUNTER — Encounter: Payer: Self-pay | Admitting: Physical Therapy

## 2015-05-30 DIAGNOSIS — R531 Weakness: Secondary | ICD-10-CM

## 2015-05-30 DIAGNOSIS — M542 Cervicalgia: Secondary | ICD-10-CM | POA: Diagnosis not present

## 2015-05-30 DIAGNOSIS — M2560 Stiffness of unspecified joint, not elsewhere classified: Secondary | ICD-10-CM

## 2015-05-30 NOTE — Therapy (Signed)
[facility] [address] [place], [place], [address] Phone: [phone]   Fax:  [phone]  Physical Therapy Treatment  Patient Details  Name: [name] MRN: [id number] Date of Birth: Sep 14, 1934 Referring Provider: Rex Kras, MD  Encounter Date: 05/30/2015      PT End of Session - 05/30/15 1432    Visit Number 5   Number of Visits 12   Date for PT Re-Evaluation 06/15/15   PT Start Time 1432   PT Stop Time 1515   PT Time Calculation (min) 43 min   Activity Tolerance Patient tolerated treatment well   Behavior During Therapy [facility] for tasks assessed/performed      Past Medical History  Diagnosis Date  . Essential hypertension, benign   . Mixed hyperlipidemia   . Depression   . Allergic rhinitis   . GERD (gastroesophageal reflux disease)   . Arthritis   . History of blood transfusion   . Left bundle branch block   . History of cardiac catheterization     Normal coronaries 2010  . Secondary cardiomyopathy (HCC)     LVEF 40-45%, likely nonischemic  . Anxiety   . Osteopenia   . Cataract     had eye surgery    Past Surgical History  Procedure Laterality Date  . Cholecystectomy  1991  . Tonsillectomy    . Appendectomy    . Back surgery  07/21/2008  . Rotator cuff repair  2011    right  . Cataract extraction w/phaco  04/16/2011    Procedure: CATARACT EXTRACTION PHACO AND INTRAOCULAR LENS PLACEMENT (IOC);  Surgeon: Susa Simmonds;  Location: [facility];  Service: Ophthalmology;  Laterality: Left;  CDE=11.35  . Hammer toe surgery  11/22/11    [facility], [name]  . Cataract extraction w/phaco  01/28/2012    Procedure: CATARACT EXTRACTION PHACO AND INTRAOCULAR LENS PLACEMENT (IOC);  Surgeon: Susa Simmonds, MD;  Location: [facility];  Service: Ophthalmology;  Laterality: Right;  CDI:8.15  . Cysto with hydrodistension  05/29/2012    Procedure: CYSTOSCOPY/HYDRODISTENSION;  Surgeon: Martina Sinner, MD;  Location: [facility];  Service: Urology;  Laterality: N/A;  INSTILLATION OF MARCAINE AND PYRIDIUM   . Lumbar laminectomy/decompression microdiscectomy Left 06/06/2012    Procedure: LUMBAR LAMINECTOMY/DECOMPRESSION MICRODISCECTOMY 1 LEVEL;  Surgeon: Maeola Harman, MD;  [facility]: [facility];  Service: Neurosurgery;  Laterality: Left;  Left Lumbar two-three Laminectomy for resection of synovial cyst  . Total hip arthroplasty Right 03/13/2013    Procedure: TOTAL HIP ARTHROPLASTY;  Surgeon: Nestor Lewandowsky, MD;  Location: [facility];  Service: Orthopedics;  Laterality: Right;  . Eye surgery Bilateral     catar  . Abdominal hysterectomy      complete  . Spine surgery      back suregry 2011  . Joint replacement      right hip replacement 2013    There were no vitals filed for this visit.  Visit Diagnosis:  Neck pain  Weakness  Stiffness of multiple joints      Subjective Assessment - 05/30/15 1431    Subjective Reports that she reports tightness in her neck today. Notes that she had to work today. Reports another family death over the weekend and illness of a family friend. Asked regarding medications to help with her neck issues.   Pertinent History HTN, Heart trouble, asthma   Patient Stated Goals Improve strengthe, return to recreational activities   Currently in Pain? Yes   Pain  Score 7    Pain Location Neck   Pain Orientation Left   Pain Descriptors / Indicators Tightness   Pain Type Chronic pain   Pain Onset More than a month ago            [facility] PT Assessment - 05/30/15 0001    Assessment   Medical Diagnosis neck pain   Onset Date/Surgical Date 05/03/09   Precautions   Precaution Comments Rt THA                     [facility] Adult PT Treatment/Exercise - 05/30/15 0001    Modalities   Modalities Electrical Stimulation;Moist Heat;Ultrasound   Moist Heat Therapy   Number Minutes Moist Heat 15 Minutes   Moist Heat Location Cervical   Electrical Stimulation   Electrical  Stimulation Location L cervical paraspinals/ UT   Electrical Stimulation Action Pre-Mod   Electrical Stimulation Parameters 80-150 Hz x15 min   Electrical Stimulation Goals Pain   Ultrasound   Ultrasound Location L UT/ Levator Scapula   Ultrasound Parameters 1.5 w/cm2, 100%,1 mhz x10 min   Ultrasound Goals Pain   Manual Therapy   Manual Therapy Myofascial release   Myofascial Release MFR to L UT/ Levator Scapula/ cervical paraspinals in sitting to decrease pain and tightness                     PT Long Term Goals - 05/04/15 1534    PT LONG TERM GOAL #1   Title Improved left cervical rotation to 65 degrees or more to improve ADLS   Time 6   Period Weeks   Status New   PT LONG TERM GOAL #2   Title decreased pain in neck to 4/10 or better with motion.   Time 6   Period Weeks   Status New               Plan - 05/30/15 1502    Clinical Impression Statement Patient tolerated today's treatment fairly well although she was experiencing increased cervical pain and cervical muscle tightness prior to treatment. Tightness noted in L UT and cervical paraspinals region today during manual therapy. Increased muscle tightness may also be due to recent stress related to recent family death and illness of family friend. TIghtness decreased with MFR with pressure to L UT, Levator Scapula and cervical paraspinals while in sitting. Normal modalities response noted following removal of the modaliites. Goals remain on-going at this time secondary to pain experienced by patient as well as  cervical ROM deficits. Educated patient regarding question about medication for her cevical spine issues to contact her PCP for any further questions. Patient experienced feeling "much better" following today's treatment and only had pain with L cervical rotation in lateral neck but gave no numerical pain rating for said pain.   Pt will benefit from skilled therapeutic intervention in order to improve on  the following deficits Decreased range of motion;Pain;Decreased activity tolerance;Impaired flexibility;Decreased strength   Rehab Potential Good   PT Frequency 2x / week   PT Duration 6 weeks   PT Treatment/Interventions ADLs/Self Care Home Management;Cryotherapy;Electrical Stimulation;Moist Heat;Therapeutic exercise;Ultrasound;Traction;Neuromuscular re-education;Patient/family education;Manual techniques;Dry needling;Passive range of motion   PT Next Visit Plan STW to L UT/levator scap/neck. manual distraction, cervical and scap stab; modalities prn for pain.   PT Home Exercise Plan UT, Lev scap stretches and scapular retraction   Consulted and Agree with Plan of Care Patient        Problem  List Patient Active Problem List   Diagnosis Date Noted  . Memory problem 05/07/2015  . COPD (chronic obstructive pulmonary disease) (HCC) 04/19/2015  . Neck pain 04/06/2015  . Insomnia 12/10/2014  . BMI 28.0-28.9,adult 12/10/2014  . Osteopenia 05/19/2014  . GAD (generalized anxiety disorder) 01/06/2014  . Depression 01/06/2014  . GERD (gastroesophageal reflux disease) 01/06/2014  . Vitamin D deficiency 01/06/2014  . Overactive bladder 01/06/2014  . Secondary cardiomyopathy (HCC) 10/06/2013  . Left bundle branch block 10/06/2013  . Dizziness 10/04/2013  . Osteoarthritis of right hip 03/14/2013  . Allergic rhinitis 12/07/2008  . Chronic cough 12/07/2008  . Hepatitis B virus infection 08/16/2008  . Hyperlipidemia 08/16/2008  . Essential hypertension 08/16/2008  . Osteoarthritis 08/16/2008  . DISC DEGENERATION 07/24/2007    Evelene Croon, PTA 05/30/2015, 3:19 PM  [facility] [address] [place], [place], [address] Phone: [phone]   Fax:  [phone]  Name: [name] MRN: [id number] Date of Birth: 1934/08/31

## 2015-05-31 ENCOUNTER — Ambulatory Visit: Payer: Medicare Other | Admitting: Physical Therapy

## 2015-05-31 ENCOUNTER — Encounter: Payer: Self-pay | Admitting: Physical Therapy

## 2015-05-31 DIAGNOSIS — M2560 Stiffness of unspecified joint, not elsewhere classified: Secondary | ICD-10-CM

## 2015-05-31 DIAGNOSIS — M542 Cervicalgia: Secondary | ICD-10-CM

## 2015-05-31 DIAGNOSIS — R531 Weakness: Secondary | ICD-10-CM

## 2015-05-31 NOTE — Therapy (Signed)
[facility] [address] [place], [place], [address] Phone: [phone]   Fax:  [phone]  Physical Therapy Treatment  Patient Details  Name: [name] MRN: [id number] Date of Birth: Sep 25, 1934 Referring Provider: Rex Kras, MD  Encounter Date: 05/31/2015      PT End of Session - 05/31/15 1519    Visit Number 6   Number of Visits 12   Date for PT Re-Evaluation 06/15/15   PT Start Time 1519   PT Stop Time 1601   PT Time Calculation (min) 42 min      Past Medical History  Diagnosis Date  . Essential hypertension, benign   . Mixed hyperlipidemia   . Depression   . Allergic rhinitis   . GERD (gastroesophageal reflux disease)   . Arthritis   . History of blood transfusion   . Left bundle branch block   . History of cardiac catheterization     Normal coronaries 2010  . Secondary cardiomyopathy (HCC)     LVEF 40-45%, likely nonischemic  . Anxiety   . Osteopenia   . Cataract     had eye surgery    Past Surgical History  Procedure Laterality Date  . Cholecystectomy  1991  . Tonsillectomy    . Appendectomy    . Back surgery  07/21/2008  . Rotator cuff repair  2011    right  . Cataract extraction w/phaco  04/16/2011    Procedure: CATARACT EXTRACTION PHACO AND INTRAOCULAR LENS PLACEMENT (IOC);  Surgeon: Susa Simmonds;  Location: [facility];  Service: Ophthalmology;  Laterality: Left;  CDE=11.35  . Hammer toe surgery  11/22/11    [facility], [name]  . Cataract extraction w/phaco  01/28/2012    Procedure: CATARACT EXTRACTION PHACO AND INTRAOCULAR LENS PLACEMENT (IOC);  Surgeon: Susa Simmonds, MD;  Location: [facility];  Service: Ophthalmology;  Laterality: Right;  CDI:8.15  . Cysto with hydrodistension  05/29/2012    Procedure: CYSTOSCOPY/HYDRODISTENSION;  Surgeon: Martina Sinner, MD;  Location: [facility];  Service: Urology;  Laterality: N/A;  INSTILLATION OF MARCAINE AND PYRIDIUM   . Lumbar  laminectomy/decompression microdiscectomy Left 06/06/2012    Procedure: LUMBAR LAMINECTOMY/DECOMPRESSION MICRODISCECTOMY 1 LEVEL;  Surgeon: Maeola Harman, MD;  [facility];  Service: Neurosurgery;  Laterality: Left;  Left Lumbar two-three Laminectomy for resection of synovial cyst  . Total hip arthroplasty Right 03/13/2013    Procedure: TOTAL HIP ARTHROPLASTY;  Surgeon: Nestor Lewandowsky, MD;  Location: [facility];  Service: Orthopedics;  Laterality: Right;  . Eye surgery Bilateral     catar  . Abdominal hysterectomy      complete  . Spine surgery      back suregry 2011  . Joint replacement      right hip replacement 2013    There were no vitals filed for this visit.  Visit Diagnosis:  Neck pain  Weakness  Stiffness of multiple joints      Subjective Assessment - 05/31/15 1518    Subjective Reports her neck is better from yesterday but can still feel tightness.   Pertinent History HTN, Heart trouble, asthma   Patient Stated Goals Improve strengthe, return to recreational activities   Currently in Pain? Yes   Pain Score 6    Pain Location Neck   Pain Orientation Left   Pain Descriptors / Indicators Tightness   Pain Type Chronic pain   Pain Onset More than a month ago  [facility] PT Assessment - 05/31/15 0001    Assessment   Medical Diagnosis neck pain   Onset Date/Surgical Date 05/03/09   Precautions   Precaution Comments Rt THA   ROM / Strength   AROM / PROM / Strength AROM   AROM   Overall AROM  Deficits   AROM Assessment Site Cervical   Cervical - Right Rotation 60   Cervical - Left Rotation 46                     [facility] Adult PT Treatment/Exercise - 05/31/15 0001    Modalities   Modalities Electrical Stimulation;Moist Heat;Ultrasound   Moist Heat Therapy   Number Minutes Moist Heat 15 Minutes   Moist Heat Location Cervical   Electrical Stimulation   Electrical Stimulation Location L UT/ Levator Scapula   Electrical Stimulation Action  Pre-Mod   Electrical Stimulation Parameters 80-150 Hz x15 min   Electrical Stimulation Goals Pain   Ultrasound   Ultrasound Location L UT/ Levator Scapula   Ultrasound Parameters 1.5 w/cm2, 100%,1 mhz x10 min   Ultrasound Goals Pain   Manual Therapy   Manual Therapy Myofascial release   Myofascial Release MFR to L UT/ Levator Scapula/ cervical paraspinals in sitting to decrease pain and tightness                     PT Long Term Goals - 05/31/15 1606    PT LONG TERM GOAL #1   Title Improved left cervical rotation to 65 degrees or more to improve ADLS   Time 6   Period Weeks   Status On-going  L cervical rotation AROM 46 deg 05/31/2015   PT LONG TERM GOAL #2   Title decreased pain in neck to 4/10 or better with motion.   Time 6   Period Weeks   Status On-going               Plan - 05/31/15 1606    Clinical Impression Statement Patient tolerated today's treatment well and expresses that she feels as her neck is improving. Tightness continues to be present in L UT, Levator, Scapula and into cervical paraspinals today during manual therapy with no complaints of soreness or pain. Patient continued to experience end range pain and tightness at end range of L cervical rotation following treatment to which she indicated to area just posterior to L ear. Normal modalities response noted following end of the modalities. AROM B cervical rotation measurements seemed to have decreased from initial evaluation although patient reports improvement. Goals are on-going at this time secondary to ROM deficits and increased pain.   Pt will benefit from skilled therapeutic intervention in order to improve on the following deficits Decreased range of motion;Pain;Decreased activity tolerance;Impaired flexibility;Decreased strength   Rehab Potential Good   PT Frequency 2x / week   PT Duration 6 weeks   PT Treatment/Interventions ADLs/Self Care Home Management;Cryotherapy;Electrical  Stimulation;Moist Heat;Therapeutic exercise;Ultrasound;Traction;Neuromuscular re-education;Patient/family education;Manual techniques;Dry needling;Passive range of motion   PT Next Visit Plan STW to L UT/levator scap/neck. manual distraction, cervical and scap stab; modalities prn for pain.   PT Home Exercise Plan UT, Lev scap stretches and scapular retraction   Consulted and Agree with Plan of Care Patient        Problem List Patient Active Problem List   Diagnosis Date Noted  . Memory problem 05/07/2015  . COPD (chronic obstructive pulmonary disease) (HCC) 04/19/2015  . Neck pain 04/06/2015  . Insomnia 12/10/2014  .  BMI 28.0-28.9,adult 12/10/2014  . Osteopenia 05/19/2014  . GAD (generalized anxiety disorder) 01/06/2014  . Depression 01/06/2014  . GERD (gastroesophageal reflux disease) 01/06/2014  . Vitamin D deficiency 01/06/2014  . Overactive bladder 01/06/2014  . Secondary cardiomyopathy (HCC) 10/06/2013  . Left bundle branch block 10/06/2013  . Dizziness 10/04/2013  . Osteoarthritis of right hip 03/14/2013  . Allergic rhinitis 12/07/2008  . Chronic cough 12/07/2008  . Hepatitis B virus infection 08/16/2008  . Hyperlipidemia 08/16/2008  . Essential hypertension 08/16/2008  . Osteoarthritis 08/16/2008  . DISC DEGENERATION 07/24/2007    Evelene Croon, PTA 05/31/2015, 4:14 PM  [facility] [address] [place], [place], [address] Phone: [phone]   Fax:  [phone]  Name: [name] MRN: [id number] Date of Birth: Jan 20, 1935

## 2015-06-01 ENCOUNTER — Encounter: Payer: Medicare Other | Admitting: Physical Therapy

## 2015-06-06 ENCOUNTER — Encounter: Payer: Medicare Other | Admitting: Physical Therapy

## 2015-06-08 ENCOUNTER — Encounter: Payer: Medicare Other | Admitting: Physical Therapy

## 2015-06-09 ENCOUNTER — Ambulatory Visit: Payer: Medicare Other | Attending: Pediatrics | Admitting: Physical Therapy

## 2015-06-09 ENCOUNTER — Encounter: Payer: Self-pay | Admitting: Physical Therapy

## 2015-06-09 DIAGNOSIS — R531 Weakness: Secondary | ICD-10-CM | POA: Diagnosis not present

## 2015-06-09 DIAGNOSIS — M542 Cervicalgia: Secondary | ICD-10-CM | POA: Insufficient documentation

## 2015-06-09 DIAGNOSIS — M2560 Stiffness of unspecified joint, not elsewhere classified: Secondary | ICD-10-CM | POA: Insufficient documentation

## 2015-06-09 NOTE — Therapy (Signed)
[facility] [address] [place], [place], [address] Phone: [phone]   Fax:  [phone]  Physical Therapy Treatment  Patient Details  Name: [name] MRN: [id number] Date of Birth: Nov 12, 1934 Referring Provider: Rex Kras, MD  Encounter Date: 06/09/2015      PT End of Session - 06/09/15 1649    Visit Number 7   Number of Visits 12   Date for PT Re-Evaluation 06/15/15   PT Start Time 1649   PT Stop Time (p) 1734   PT Time Calculation (min) (p) 45 min   Activity Tolerance Patient tolerated treatment well   Behavior During Therapy [facility] for tasks assessed/performed      Past Medical History  Diagnosis Date  . Essential hypertension, benign   . Mixed hyperlipidemia   . Depression   . Allergic rhinitis   . GERD (gastroesophageal reflux disease)   . Arthritis   . History of blood transfusion   . Left bundle branch block   . History of cardiac catheterization     Normal coronaries 2010  . Secondary cardiomyopathy (HCC)     LVEF 40-45%, likely nonischemic  . Anxiety   . Osteopenia   . Cataract     had eye surgery    Past Surgical History  Procedure Laterality Date  . Cholecystectomy  1991  . Tonsillectomy    . Appendectomy    . Back surgery  07/21/2008  . Rotator cuff repair  2011    right  . Cataract extraction w/phaco  04/16/2011    Procedure: CATARACT EXTRACTION PHACO AND INTRAOCULAR LENS PLACEMENT (IOC);  Surgeon: Susa Simmonds;  Location: [facility];  Service: Ophthalmology;  Laterality: Left;  CDE=11.35  . Hammer toe surgery  11/22/11    [facility], [name]  . Cataract extraction w/phaco  01/28/2012    Procedure: CATARACT EXTRACTION PHACO AND INTRAOCULAR LENS PLACEMENT (IOC);  Surgeon: Susa Simmonds, MD;  Location: [facility];  Service: Ophthalmology;  Laterality: Right;  CDI:8.15  . Cysto with hydrodistension  05/29/2012    Procedure: CYSTOSCOPY/HYDRODISTENSION;  Surgeon: Martina Sinner, MD;  Location: [facility];  Service: Urology;  Laterality: N/A;  INSTILLATION OF MARCAINE AND PYRIDIUM   . Lumbar laminectomy/decompression microdiscectomy Left 06/06/2012    Procedure: LUMBAR LAMINECTOMY/DECOMPRESSION MICRODISCECTOMY 1 LEVEL;  Surgeon: Maeola Harman, MD;  [facility];  Service: Neurosurgery;  Laterality: Left;  Left Lumbar two-three Laminectomy for resection of synovial cyst  . Total hip arthroplasty Right 03/13/2013    Procedure: TOTAL HIP ARTHROPLASTY;  Surgeon: Nestor Lewandowsky, MD;  [facility]: [facility];  Service: Orthopedics;  Laterality: Right;  . Eye surgery Bilateral     catar  . Abdominal hysterectomy      complete  . Spine surgery      back suregry 2011  . Joint replacement      right hip replacement 2013    There were no vitals filed for this visit.  Visit Diagnosis:  Neck pain  Weakness      Subjective Assessment - 06/09/15 1648    Subjective Reports that her neck feels a little better today.   Pertinent History HTN, Heart trouble, asthma   Patient Stated Goals Improve strengthe, return to recreational activities   Currently in Pain? Yes   Pain Score 6    Pain Location Neck   Pain Orientation Right   Pain Descriptors / Indicators Tightness   Pain Type Chronic pain   Pain Onset More than  a month ago            [facility] PT Assessment - 06/09/15 0001    Assessment   Medical Diagnosis neck pain   Onset Date/Surgical Date 05/03/09   Precautions   Precaution Comments Rt THA                     [facility] Adult PT Treatment/Exercise - 06/09/15 0001    Modalities   Modalities Electrical Stimulation;Moist Heat;Ultrasound   Moist Heat Therapy   Number Minutes Moist Heat 15 Minutes   Moist Heat Location Cervical   Electrical Stimulation   Electrical Stimulation Location L UT/ cervical paraspinals   Electrical Stimulation Action Pre-Mod   Electrical Stimulation Parameters 80-150 Hz x15 min   Electrical Stimulation Goals Pain   Ultrasound    Ultrasound Location L UT/ cervical paraspinals   Ultrasound Parameters 1.5 w/cm2, 100%,1 mhz x10 min   Ultrasound Goals Pain   Manual Therapy   Manual Therapy Myofascial release   Myofascial Release MFR to L UT/ Levator Scapula/ cervical paraspinals in sitting to decrease pain and tightness                     PT Long Term Goals - 05/31/15 1606    PT LONG TERM GOAL #1   Title Improved left cervical rotation to 65 degrees or more to improve ADLS   Time 6   Period Weeks   Status On-going  L cervical rotation AROM 46 deg 05/31/2015   PT LONG TERM GOAL #2   Title decreased pain in neck to 4/10 or better with motion.   Time 6   Period Weeks   Status On-going               Plan - 06/09/15 1732    Clinical Impression Statement Patient tolerated today's treatment well with no reports of soreness or tenderness during today's manual therapy. Patient presented today with notable increased tightness in L UT and cervical paraspinals when compared to R musculature. Patient 's goals are on-going at this time secondary to the increased pain she experiences with cervical rotation and lack of cervical ROM. Normal modalities response noted following removal of the modalities.    Pt will benefit from skilled therapeutic intervention in order to improve on the following deficits Decreased range of motion;Pain;Decreased activity tolerance;Impaired flexibility;Decreased strength   Rehab Potential Good   PT Frequency 2x / week   PT Duration 6 weeks   PT Treatment/Interventions ADLs/Self Care Home Management;Cryotherapy;Electrical Stimulation;Moist Heat;Therapeutic exercise;Ultrasound;Traction;Neuromuscular re-education;Patient/family education;Manual techniques;Dry needling;Passive range of motion   PT Next Visit Plan STW to L UT/levator scap/neck. manual distraction, cervical and scap stab; modalities prn for pain.   PT Home Exercise Plan UT, Lev scap stretches and scapular retraction    Consulted and Agree with Plan of Care Patient        Problem List Patient Active Problem List   Diagnosis Date Noted  . Memory problem 05/07/2015  . COPD (chronic obstructive pulmonary disease) (HCC) 04/19/2015  . Neck pain 04/06/2015  . Insomnia 12/10/2014  . BMI 28.0-28.9,adult 12/10/2014  . Osteopenia 05/19/2014  . GAD (generalized anxiety disorder) 01/06/2014  . Depression 01/06/2014  . GERD (gastroesophageal reflux disease) 01/06/2014  . Vitamin D deficiency 01/06/2014  . Overactive bladder 01/06/2014  . Secondary cardiomyopathy (HCC) 10/06/2013  . Left bundle branch block 10/06/2013  . Dizziness 10/04/2013  . Osteoarthritis of right hip 03/14/2013  . Allergic rhinitis 12/07/2008  .  Chronic cough 12/07/2008  . Hepatitis B virus infection 08/16/2008  . Hyperlipidemia 08/16/2008  . Essential hypertension 08/16/2008  . Osteoarthritis 08/16/2008  . DISC DEGENERATION 07/24/2007    Evelene Croon, PTA 06/09/2015, 5:52 PM  [facility] [address] [place], [place], [address] Phone: [phone]   Fax:  [phone]  Name: [name] MRN: [id number] Date of Birth: Apr 26, 1935

## 2015-06-13 ENCOUNTER — Ambulatory Visit: Payer: Medicare Other | Admitting: Physical Therapy

## 2015-06-13 ENCOUNTER — Encounter: Payer: Self-pay | Admitting: Physical Therapy

## 2015-06-13 DIAGNOSIS — M542 Cervicalgia: Secondary | ICD-10-CM | POA: Diagnosis not present

## 2015-06-13 DIAGNOSIS — M2560 Stiffness of unspecified joint, not elsewhere classified: Secondary | ICD-10-CM | POA: Diagnosis not present

## 2015-06-13 DIAGNOSIS — R531 Weakness: Secondary | ICD-10-CM | POA: Diagnosis not present

## 2015-06-13 NOTE — Therapy (Signed)
[facility] [address] [place], [place], [address] Phone: [phone]   Fax:  [phone]  Physical Therapy Treatment  Patient Details  Name: [name] MRN: [id number] Date of Birth: 09/11/1934 Referring Provider: Rex Kras, MD  Encounter Date: 06/13/2015      PT End of Session - 06/13/15 0735    Visit Number 8   Number of Visits 12   Date for PT Re-Evaluation 06/15/15   PT Start Time 0735   PT Stop Time 0816   PT Time Calculation (min) 41 min   Activity Tolerance Patient tolerated treatment well   Behavior During Therapy [facility] for tasks assessed/performed      Past Medical History  Diagnosis Date  . Essential hypertension, benign   . Mixed hyperlipidemia   . Depression   . Allergic rhinitis   . GERD (gastroesophageal reflux disease)   . Arthritis   . History of blood transfusion   . Left bundle branch block   . History of cardiac catheterization     Normal coronaries 2010  . Secondary cardiomyopathy (HCC)     LVEF 40-45%, likely nonischemic  . Anxiety   . Osteopenia   . Cataract     had eye surgery    Past Surgical History  Procedure Laterality Date  . Cholecystectomy  1991  . Tonsillectomy    . Appendectomy    . Back surgery  07/21/2008  . Rotator cuff repair  2011    right  . Cataract extraction w/phaco  04/16/2011    Procedure: CATARACT EXTRACTION PHACO AND INTRAOCULAR LENS PLACEMENT (IOC);  Surgeon: Susa Simmonds;  Location: [facility];  Service: Ophthalmology;  Laterality: Left;  CDE=11.35  . Hammer toe surgery  11/22/11    [facility], [name]  . Cataract extraction w/phaco  01/28/2012    Procedure: CATARACT EXTRACTION PHACO AND INTRAOCULAR LENS PLACEMENT (IOC);  Surgeon: Susa Simmonds, MD;  Location: [facility];  Service: Ophthalmology;  Laterality: Right;  CDI:8.15  . Cysto with hydrodistension  05/29/2012    Procedure: CYSTOSCOPY/HYDRODISTENSION;  Surgeon: Martina Sinner, MD;  Location: [facility];  Service: Urology;  Laterality: N/A;  INSTILLATION OF MARCAINE AND PYRIDIUM   . Lumbar laminectomy/decompression microdiscectomy Left 06/06/2012    Procedure: LUMBAR LAMINECTOMY/DECOMPRESSION MICRODISCECTOMY 1 LEVEL;  Surgeon: Maeola Harman, MD;  [facility];  Service: Neurosurgery;  Laterality: Left;  Left Lumbar two-three Laminectomy for resection of synovial cyst  . Total hip arthroplasty Right 03/13/2013    Procedure: TOTAL HIP ARTHROPLASTY;  Surgeon: Nestor Lewandowsky, MD;  Location: [facility];  Service: Orthopedics;  Laterality: Right;  . Eye surgery Bilateral     catar  . Abdominal hysterectomy      complete  . Spine surgery      back suregry 2011  . Joint replacement      right hip replacement 2013    There were no vitals filed for this visit.  Visit Diagnosis:  Neck pain  Weakness      Subjective Assessment - 06/13/15 0734    Subjective Reports improvement overall with decreased tightness but stil has some tightness   Pertinent History HTN, Heart trouble, asthma   Patient Stated Goals Improve strengthe, return to recreational activities   Currently in Pain? Yes   Pain Score 6    Pain Location Neck   Pain Orientation Left   Pain Descriptors / Indicators Tightness   Pain Type Chronic pain   Pain Onset More than  a month ago            [facility] PT Assessment - 06/13/15 0001    Assessment   Medical Diagnosis neck pain   Onset Date/Surgical Date 05/03/09   Precautions   Precaution Comments Rt THA                     [facility] Adult PT Treatment/Exercise - 06/13/15 0001    Modalities   Modalities Electrical Stimulation;Moist Heat;Ultrasound   Moist Heat Therapy   Number Minutes Moist Heat 15 Minutes   Moist Heat Location Cervical   Electrical Stimulation   Electrical Stimulation Location L UT/ cervical paraspinals   Electrical Stimulation Action Pre-Mod   Electrical Stimulation Parameters 80-150 Hz x15 min   Electrical Stimulation  Goals Pain   Ultrasound   Ultrasound Location L UT/ cervical paraspinals   Ultrasound Parameters 1.5 w/cm2, 100%,1 mhz x10 min   Ultrasound Goals Pain   Manual Therapy   Manual Therapy Myofascial release   Myofascial Release MFR to L UT/ Levator Scapula/ cervical paraspinals in sitting to decrease pain and tightness                     PT Long Term Goals - 05/31/15 1606    PT LONG TERM GOAL #1   Title Improved left cervical rotation to 65 degrees or more to improve ADLS   Time 6   Period Weeks   Status On-going  L cervical rotation AROM 46 deg 05/31/2015   PT LONG TERM GOAL #2   Title decreased pain in neck to 4/10 or better with motion.   Time 6   Period Weeks   Status On-going               Plan - 06/13/15 0805    Clinical Impression Statement Patient tolerated today's treatment well with no reports of soreness during manual therapy. Patient presented with tightness particularly in the L UT and Levator Scapula today. Patient continues to report overall improvement with cervical pain although goals remain on-going at this time secondary to pain deficits and ROM deficits. Normal modalities response noted following removal of the modalities. Patient experienced 5/10 cervical pain but noted that her neck felt "better" following end of treatment.   Pt will benefit from skilled therapeutic intervention in order to improve on the following deficits Decreased range of motion;Pain;Decreased activity tolerance;Impaired flexibility;Decreased strength   Rehab Potential Good   PT Frequency 2x / week   PT Duration 6 weeks   PT Treatment/Interventions ADLs/Self Care Home Management;Cryotherapy;Electrical Stimulation;Moist Heat;Therapeutic exercise;Ultrasound;Traction;Neuromuscular re-education;Patient/family education;Manual techniques;Dry needling;Passive range of motion   PT Next Visit Plan STW to L UT/levator scap/neck. manual distraction, cervical and scap stab; modalities  prn for pain.   PT Home Exercise Plan UT, Lev scap stretches and scapular retraction   Consulted and Agree with Plan of Care Patient        Problem List Patient Active Problem List   Diagnosis Date Noted  . Memory problem 05/07/2015  . COPD (chronic obstructive pulmonary disease) (HCC) 04/19/2015  . Neck pain 04/06/2015  . Insomnia 12/10/2014  . BMI 28.0-28.9,adult 12/10/2014  . Osteopenia 05/19/2014  . GAD (generalized anxiety disorder) 01/06/2014  . Depression 01/06/2014  . GERD (gastroesophageal reflux disease) 01/06/2014  . Vitamin D deficiency 01/06/2014  . Overactive bladder 01/06/2014  . Secondary cardiomyopathy (HCC) 10/06/2013  . Left bundle branch block 10/06/2013  . Dizziness 10/04/2013  . Osteoarthritis of right hip 03/14/2013  .  Allergic rhinitis 12/07/2008  . Chronic cough 12/07/2008  . Hepatitis B virus infection 08/16/2008  . Hyperlipidemia 08/16/2008  . Essential hypertension 08/16/2008  . Osteoarthritis 08/16/2008  . DISC DEGENERATION 07/24/2007   Florence Canner, PTA 06/13/2015 8:20 AM   [facility] [address] [place], [place], [address] Phone: [phone]   Fax:  [phone]  Name: [name] MRN: [id number] Date of Birth: [birth year]

## 2015-06-15 ENCOUNTER — Encounter: Payer: Self-pay | Admitting: Physical Therapy

## 2015-06-15 ENCOUNTER — Ambulatory Visit: Payer: Medicare Other | Admitting: Physical Therapy

## 2015-06-15 DIAGNOSIS — M2560 Stiffness of unspecified joint, not elsewhere classified: Secondary | ICD-10-CM

## 2015-06-15 DIAGNOSIS — M542 Cervicalgia: Secondary | ICD-10-CM | POA: Diagnosis not present

## 2015-06-15 DIAGNOSIS — R531 Weakness: Secondary | ICD-10-CM | POA: Diagnosis not present

## 2015-06-15 NOTE — Therapy (Signed)
[facility] [address] [place], [place], [address] Phone: [phone]   Fax:  [phone]  Physical Therapy Treatment  Patient Details  Name: [name] MRN: [id number] Date of Birth: 01-Feb-1935 Referring Provider: Rex Kras, MD  Encounter Date: 06/15/2015      PT End of Session - 06/15/15 0740    Visit Number 9   Number of Visits 12   Date for PT Re-Evaluation 08/08/15   PT Start Time 0740   PT Stop Time 0821   PT Time Calculation (min) 41 min   Activity Tolerance Patient tolerated treatment well   Behavior During Therapy [facility] for tasks assessed/performed      Past Medical History  Diagnosis Date  . Essential hypertension, benign   . Mixed hyperlipidemia   . Depression   . Allergic rhinitis   . GERD (gastroesophageal reflux disease)   . Arthritis   . History of blood transfusion   . Left bundle branch block   . History of cardiac catheterization     Normal coronaries 2010  . Secondary cardiomyopathy (HCC)     LVEF 40-45%, likely nonischemic  . Anxiety   . Osteopenia   . Cataract     had eye surgery    Past Surgical History  Procedure Laterality Date  . Cholecystectomy  1991  . Tonsillectomy    . Appendectomy    . Back surgery  07/21/2008  . Rotator cuff repair  2011    right  . Cataract extraction w/phaco  04/16/2011    Procedure: CATARACT EXTRACTION PHACO AND INTRAOCULAR LENS PLACEMENT (IOC);  Surgeon: Susa Simmonds;  Location: [facility];  Service: Ophthalmology;  Laterality: Left;  CDE=11.35  . Hammer toe surgery  11/22/11    [facility], [name]  . Cataract extraction w/phaco  01/28/2012    Procedure: CATARACT EXTRACTION PHACO AND INTRAOCULAR LENS PLACEMENT (IOC);  Surgeon: Susa Simmonds, MD;  Location: [facility];  Service: Ophthalmology;  Laterality: Right;  CDI:8.15  . Cysto with hydrodistension  05/29/2012    Procedure: CYSTOSCOPY/HYDRODISTENSION;  Surgeon: Martina Sinner, MD;  Location: [facility];  Service: Urology;  Laterality: N/A;  INSTILLATION OF MARCAINE AND PYRIDIUM   . Lumbar laminectomy/decompression microdiscectomy Left 06/06/2012    Procedure: LUMBAR LAMINECTOMY/DECOMPRESSION MICRODISCECTOMY 1 LEVEL;  Surgeon: Maeola Harman, MD;  Location: [facility];  Service: Neurosurgery;  Laterality: Left;  Left Lumbar two-three Laminectomy for resection of synovial cyst  . Total hip arthroplasty Right 03/13/2013    Procedure: TOTAL HIP ARTHROPLASTY;  Surgeon: Nestor Lewandowsky, MD;  Location: [facility];  Service: Orthopedics;  Laterality: Right;  . Eye surgery Bilateral     catar  . Abdominal hysterectomy      complete  . Spine surgery      back suregry 2011  . Joint replacement      right hip replacement 2013    There were no vitals filed for this visit.  Visit Diagnosis:  Neck pain  Stiffness of multiple joints  Weakness      Subjective Assessment - 06/15/15 0740    Subjective Reports that her neck feels but not completely gone.   Pertinent History HTN, Heart trouble, asthma   Patient Stated Goals Improve strengthe, return to recreational activities   Currently in Pain? Yes   Pain Score 5    Pain Location Neck   Pain Orientation Left   Pain Descriptors / Indicators --  pain with rotation to L   Pain  Type Chronic pain   Pain Onset More than a month ago            [facility] PT Assessment - 06/15/15 0001    Assessment   Medical Diagnosis neck pain   Onset Date/Surgical Date 05/03/09   Precautions   Precaution Comments Rt THA                     [facility] Adult PT Treatment/Exercise - 06/15/15 0001    Modalities   Modalities Electrical Stimulation;Moist Heat;Ultrasound   Moist Heat Therapy   Number Minutes Moist Heat 15 Minutes   Moist Heat Location Cervical   Electrical Stimulation   Electrical Stimulation Location L UT/ cervical paraspinals   Electrical Stimulation Action Pre-Mod   Electrical Stimulation Parameters 80-150 hz x15 min    Electrical Stimulation Goals Pain   Ultrasound   Ultrasound Location L UT   Ultrasound Parameters 1.5 w/cm2, 100%,1 mhz x10 min   Ultrasound Goals Pain   Manual Therapy   Manual Therapy Myofascial release   Myofascial Release MFR to L UT/ Levator Scapula/ cervical paraspinals in sitting to decrease pain and tightness                     PT Long Term Goals - 05/31/15 1606    PT LONG TERM GOAL #1   Title Improved left cervical rotation to 65 degrees or more to improve ADLS   Time 6   Period Weeks   Status On-going  L cervical rotation AROM 46 deg 05/31/2015   PT LONG TERM GOAL #2   Title decreased pain in neck to 4/10 or better with motion.   Time 6   Period Weeks   Status On-going               Plan - 06/15/15 7371    Clinical Impression Statement Patient tolerated today's treatent well with no reports of tenderness with manual therapy today. Patient presented with tightness notably in L UT in superior region as it as ascends into cervical spine. Patient's greatest limitation is pain with cervical rotatoin to L per patient report. Goals continue to remain on-going secondary to pain experienced and decreased cervical ROM. Normal modalities response noted following removal of the modalities. Patient experienced neck feeling "good" following today's treatment.   Pt will benefit from skilled therapeutic intervention in order to improve on the following deficits Decreased range of motion;Pain;Decreased activity tolerance;Impaired flexibility;Decreased strength   Rehab Potential Good   PT Frequency 2x / week   PT Duration 6 weeks   PT Treatment/Interventions ADLs/Self Care Home Management;Cryotherapy;Electrical Stimulation;Moist Heat;Therapeutic exercise;Ultrasound;Traction;Neuromuscular re-education;Patient/family education;Manual techniques;Dry needling;Passive range of motion   PT Next Visit Plan STW to L UT/levator scap/neck. manual distraction, cervical and scap  stab; modalities prn for pain.   PT Home Exercise Plan UT, Lev scap stretches and scapular retraction   Consulted and Agree with Plan of Care Patient        Problem List Patient Active Problem List   Diagnosis Date Noted  . Memory problem 05/07/2015  . COPD (chronic obstructive pulmonary disease) (HCC) 04/19/2015  . Neck pain 04/06/2015  . Insomnia 12/10/2014  . BMI 28.0-28.9,adult 12/10/2014  . Osteopenia 05/19/2014  . GAD (generalized anxiety disorder) 01/06/2014  . Depression 01/06/2014  . GERD (gastroesophageal reflux disease) 01/06/2014  . Vitamin D deficiency 01/06/2014  . Overactive bladder 01/06/2014  . Secondary cardiomyopathy (HCC) 10/06/2013  . Left bundle branch block 10/06/2013  . Dizziness  10/04/2013  . Osteoarthritis of right hip 03/14/2013  . Allergic rhinitis 12/07/2008  . Chronic cough 12/07/2008  . Hepatitis B virus infection 08/16/2008  . Hyperlipidemia 08/16/2008  . Essential hypertension 08/16/2008  . Osteoarthritis 08/16/2008  . DISC DEGENERATION 07/24/2007    Evelene Croon, PTA 06/15/2015, 8:24 AM  [facility] [address] [place] [address] [place], [place], [address] Phone: [phone]   Fax:  [phone]  Name: ANANIAH BOURDON MRN: [id number] Date of Birth: 10-31-1934

## 2015-06-16 ENCOUNTER — Ambulatory Visit (INDEPENDENT_AMBULATORY_CARE_PROVIDER_SITE_OTHER): Payer: Medicare Other | Admitting: Pediatrics

## 2015-06-16 ENCOUNTER — Encounter: Payer: Self-pay | Admitting: Pediatrics

## 2015-06-16 VITALS — BP 156/76 | HR 64 | Temp 97.2°F | Ht 61.0 in | Wt 156.8 lb

## 2015-06-16 DIAGNOSIS — R110 Nausea: Secondary | ICD-10-CM

## 2015-06-16 DIAGNOSIS — F32A Depression, unspecified: Secondary | ICD-10-CM

## 2015-06-16 DIAGNOSIS — R5383 Other fatigue: Secondary | ICD-10-CM | POA: Diagnosis not present

## 2015-06-16 DIAGNOSIS — F329 Major depressive disorder, single episode, unspecified: Secondary | ICD-10-CM | POA: Diagnosis not present

## 2015-06-16 MED ORDER — VITAMIN B-12 1000 MCG PO TABS: 500.0000 ug | ORAL_TABLET | Freq: Every day | ORAL | Status: DC

## 2015-06-16 MED ORDER — CITALOPRAM HYDROBROMIDE 10 MG PO TABS: 10.0000 mg | ORAL_TABLET | Freq: Every day | ORAL | Status: DC

## 2015-06-16 MED ORDER — ONDANSETRON HCL 4 MG PO TABS: ORAL_TABLET | ORAL | Status: DC

## 2015-06-16 NOTE — Progress Notes (Signed)
Subjective:    Patient ID: [name], female    DOB: November 09, 1934, 81 y.o.   MRN: [id number]  CC: Fatigue   HPI: [name] is a 81 y.o. female presenting for Fatigue  Not feeling normal like she usually does Energy is down Wants to lay around and eat and sleep which is unlike her Ankles are swollen at end of the day  Eating 3 meals a day, not much for each meal Weight has been stable Tries to get outside and walk, gets SOB, uses inhaler some  Mood has been down Takes buproprion twice a day, has had problems with depresison in the past   Depression screen [facility] 2/9 06/16/2015 05/04/2015 04/19/2015 04/06/2015 03/14/2015  Decreased Interest 0 0 0 0 0  Down, Depressed, Hopeless 0 0 0 0 0  PHQ - 2 Score 0 0 0 0 0  Altered sleeping - - - - -  Tired, decreased energy - - - - -  Change in appetite - - - - -  Feeling bad or failure about yourself  - - - - -  Trouble concentrating - - - - -  Moving slowly or fidgety/restless - - - - -  Suicidal thoughts - - - - -  PHQ-9 Score - - - - -     Relevant past medical, surgical, family and social history reviewed and updated as indicated. Interim medical history since our last visit reviewed. Allergies and medications reviewed and updated.    ROS: Per HPI unless specifically indicated above  History  Smoking status  . Current Some Day Smoker -- 0.10 packs/day for 40 years  . Types: Cigarettes  Smokeless tobacco  . Never Used    Comment: smokes 2 cigarettes a day    Past Medical History Patient Active Problem List   Diagnosis Date Noted  . Memory problem 05/07/2015  . COPD (chronic obstructive pulmonary disease) (HCC) 04/19/2015  . Neck pain 04/06/2015  . Insomnia 12/10/2014  . BMI 28.0-28.9,adult 12/10/2014  . Osteopenia 05/19/2014  . GAD (generalized anxiety disorder) 01/06/2014  . Depression 01/06/2014  . GERD (gastroesophageal reflux disease) 01/06/2014  . Vitamin D deficiency 01/06/2014  . Overactive  bladder 01/06/2014  . Secondary cardiomyopathy (HCC) 10/06/2013  . Left bundle branch block 10/06/2013  . Dizziness 10/04/2013  . Osteoarthritis of right hip 03/14/2013  . Allergic rhinitis 12/07/2008  . Chronic cough 12/07/2008  . Hepatitis B virus infection 08/16/2008  . Hyperlipidemia 08/16/2008  . Essential hypertension 08/16/2008  . Osteoarthritis 08/16/2008  . DISC DEGENERATION 07/24/2007        Objective:    BP 156/76 mmHg  Pulse 64  Temp(Src) 97.2 F (36.2 C) (Oral)  Ht 5\' 1"  (1.549 m)  Wt 156 lb 12.8 oz (71.124 kg)  BMI 29.64 kg/m2  Wt Readings from Last 3 Encounters:  06/16/15 156 lb 12.8 oz (71.124 kg)  05/04/15 159 lb 6.4 oz (72.303 kg)  04/19/15 159 lb 9.6 oz (72.394 kg)     Gen: NAD, alert, cooperative with exam, NCAT EYES: EOMI, no scleral injection or icterus ENT:   OP without erythema LYMPH: no cervical LAD CV: NRRR, normal S1/S2, no murmur, distal pulses 2+ b/l Resp: CTABL, no wheezes, normal WOB Abd: +BS, soft, NTND.  Ext: No edema, warm Neuro: Alert and oriented Psych: normal affect     Assessment & Plan:    [name] was seen today for fatigue, feels like her depression symptoms are continuing. Already on wellbutrin. Add  SSRI. Feels safe at home. Continue other meds. Diagnoses and all orders for this visit:  Chronic nausea -     ondansetron (ZOFRAN) 4 MG tablet; TAKE 1 TABLET (4 MG TOTAL) BY MOUTH EVERY 12 (TWELVE) HOURS AS NEEDED FOR NAUSEA.  Other fatigue -     vitamin B-12 (CYANOCOBALAMIN) 1000 MCG tablet; Take 0.5 tablets (500 mcg total) by mouth daily.  Depression -     citalopram (CELEXA) 10 MG tablet; Take 1 tablet (10 mg total) by mouth daily.    Follow up plan: Return in about 4 weeks (around 07/14/2015).  Rex Kras, MD [facility] 06/16/2015, 9:04 AM

## 2015-06-20 ENCOUNTER — Encounter: Payer: Self-pay | Admitting: Physical Therapy

## 2015-06-20 ENCOUNTER — Ambulatory Visit: Payer: Medicare Other | Admitting: Physical Therapy

## 2015-06-20 DIAGNOSIS — M542 Cervicalgia: Secondary | ICD-10-CM

## 2015-06-20 DIAGNOSIS — M2560 Stiffness of unspecified joint, not elsewhere classified: Secondary | ICD-10-CM

## 2015-06-20 DIAGNOSIS — R531 Weakness: Secondary | ICD-10-CM | POA: Diagnosis not present

## 2015-06-20 NOTE — Therapy (Signed)
[facility] [address] [place], [place], [address] Phone: [phone]   Fax:  [phone]  Physical Therapy Treatment  Patient Details  Name: BASSHEVA FLURY MRN: [id number] Date of Birth: 1934-11-10 Referring Provider: Rex Kras, MD  Encounter Date: 06/20/2015      PT End of Session - 06/20/15 0812    Visit Number 10   Number of Visits 12   Date for PT Re-Evaluation 08/08/15   PT Start Time 0735   PT Stop Time 0817   PT Time Calculation (min) 42 min   Activity Tolerance Patient tolerated treatment well   Behavior During Therapy [facility] for tasks assessed/performed      Past Medical History  Diagnosis Date  . Essential hypertension, benign   . Mixed hyperlipidemia   . Depression   . Allergic rhinitis   . GERD (gastroesophageal reflux disease)   . Arthritis   . History of blood transfusion   . Left bundle branch block   . History of cardiac catheterization     Normal coronaries 2010  . Secondary cardiomyopathy (HCC)     LVEF 40-45%, likely nonischemic  . Anxiety   . Osteopenia   . Cataract     had eye surgery    Past Surgical History  Procedure Laterality Date  . Cholecystectomy  1991  . Tonsillectomy    . Appendectomy    . Back surgery  07/21/2008  . Rotator cuff repair  2011    right  . Cataract extraction w/phaco  04/16/2011    Procedure: CATARACT EXTRACTION PHACO AND INTRAOCULAR LENS PLACEMENT (IOC);  Surgeon: Susa Simmonds;  Location: [facility];  Service: Ophthalmology;  Laterality: Left;  CDE=11.35  . Hammer toe surgery  11/22/11    [facility], [name]  . Cataract extraction w/phaco  01/28/2012    Procedure: CATARACT EXTRACTION PHACO AND INTRAOCULAR LENS PLACEMENT (IOC);  Surgeon: Susa Simmonds, MD;  Location: [facility];  Service: Ophthalmology;  Laterality: Right;  CDI:8.15  . Cysto with hydrodistension  05/29/2012    Procedure: CYSTOSCOPY/HYDRODISTENSION;  Surgeon: Martina Sinner, MD;  Location: [facility];  Service: Urology;  Laterality: N/A;  INSTILLATION OF MARCAINE AND PYRIDIUM   . Lumbar laminectomy/decompression microdiscectomy Left 06/06/2012    Procedure: LUMBAR LAMINECTOMY/DECOMPRESSION MICRODISCECTOMY 1 LEVEL;  Surgeon: Maeola Harman, MD;  Location: [facility];  Service: Neurosurgery;  Laterality: Left;  Left Lumbar two-three Laminectomy for resection of synovial cyst  . Total hip arthroplasty Right 03/13/2013    Procedure: TOTAL HIP ARTHROPLASTY;  Surgeon: Nestor Lewandowsky, MD;  Location: [facility];  Service: Orthopedics;  Laterality: Right;  . Eye surgery Bilateral     catar  . Abdominal hysterectomy      complete  . Spine surgery      back suregry 2011  . Joint replacement      right hip replacement 2013    There were no vitals filed for this visit.  Visit Diagnosis:  Neck pain  Stiffness of multiple joints      Subjective Assessment - 06/20/15 0811    Subjective States that her neck feels better with 4/10 pain today. Requested more work into paraspinals and suboccipitals today.   Pertinent History HTN, Heart trouble, asthma   Patient Stated Goals Improve strengthe, return to recreational activities   Currently in Pain? Yes   Pain Score 4    Pain Location Neck   Pain Orientation Left   Pain Type Chronic pain   Pain  Onset More than a month ago            [facility] PT Assessment - 06/20/15 0001    Assessment   Medical Diagnosis neck pain   Onset Date/Surgical Date 05/03/09   Precautions   Precaution Comments Rt THA                     [facility] Adult PT Treatment/Exercise - 06/20/15 0001    Modalities   Modalities Electrical Stimulation;Moist Heat;Ultrasound   Moist Heat Therapy   Number Minutes Moist Heat 15 Minutes   Moist Heat Location Cervical   Electrical Stimulation   Electrical Stimulation Location L UT/ cervical paraspinals   Electrical Stimulation Action Pre-Mod   Electrical Stimulation Parameters 80-150 Hz x15 min   Electrical  Stimulation Goals Pain   Ultrasound   Ultrasound Location L UT/ cervical paraspinals   Ultrasound Parameters 1.5 w/cm2, 100%, 1 mhz x10 min   Ultrasound Goals Pain   Manual Therapy   Manual Therapy Myofascial release   Myofascial Release MFR to L UT/ Levator Scapula/ cervical paraspinals/ suboccipitals in sitting to decrease pain and tightness                     PT Long Term Goals - 05/31/15 1606    PT LONG TERM GOAL #1   Title Improved left cervical rotation to 65 degrees or more to improve ADLS   Time 6   Period Weeks   Status On-going  L cervical rotation AROM 46 deg 05/31/2015   PT LONG TERM GOAL #2   Title decreased pain in neck to 4/10 or better with motion.   Time 6   Period Weeks   Status On-going               Plan - 06/20/15 1025    Clinical Impression Statement Patient tolerated today's treatment well with no reports of tenderness and soreness with treatment today. Patient requested more manual therapy into cervical paraspinals and suboccipitals today and presented with tightness of the paraspinals but no complaint of pain or tenderness. Normal modalities response noted following removal of the modalities. Patient's goals remain on-going due to decreased cervical ROM and pain with rotation especially to the L. Experienced cervical region feeling "good" following treatment today.   Pt will benefit from skilled therapeutic intervention in order to improve on the following deficits Decreased range of motion;Pain;Decreased activity tolerance;Impaired flexibility;Decreased strength   Rehab Potential Good   PT Frequency 2x / week   PT Duration 6 weeks   PT Treatment/Interventions ADLs/Self Care Home Management;Cryotherapy;Electrical Stimulation;Moist Heat;Therapeutic exercise;Ultrasound;Traction;Neuromuscular re-education;Patient/family education;Manual techniques;Dry needling;Passive range of motion   PT Next Visit Plan STW to L UT/levator scap/neck. manual  distraction, cervical and scap stab; modalities prn for pain.   PT Home Exercise Plan UT, Lev scap stretches and scapular retraction   Consulted and Agree with Plan of Care Patient        Problem List Patient Active Problem List   Diagnosis Date Noted  . Memory problem 05/07/2015  . COPD (chronic obstructive pulmonary disease) (HCC) 04/19/2015  . Neck pain 04/06/2015  . Insomnia 12/10/2014  . BMI 28.0-28.9,adult 12/10/2014  . Osteopenia 05/19/2014  . GAD (generalized anxiety disorder) 01/06/2014  . Depression 01/06/2014  . GERD (gastroesophageal reflux disease) 01/06/2014  . Vitamin D deficiency 01/06/2014  . Overactive bladder 01/06/2014  . Secondary cardiomyopathy (HCC) 10/06/2013  . Left bundle branch block 10/06/2013  . Dizziness 10/04/2013  .  Osteoarthritis of right hip 03/14/2013  . Allergic rhinitis 12/07/2008  . Chronic cough 12/07/2008  . Hepatitis B virus infection 08/16/2008  . Hyperlipidemia 08/16/2008  . Essential hypertension 08/16/2008  . Osteoarthritis 08/16/2008  . DISC DEGENERATION 07/24/2007    Evelene Croon, PTA 06/20/2015, 8:23 AM  [facility] [address] [place], [place], [address] Phone: [phone]   Fax:  [phone]  Name: KAE LAUMAN MRN: [id number] Date of Birth: 1934/10/26

## 2015-06-20 NOTE — Therapy (Signed)
[facility] [address] [place], [place], [address] Phone: [phone]   Fax:  [phone]  Physical Therapy Treatment  Patient Details  Name: [name] MRN: [id number] Date of Birth: [birth year] Referring Provider: Rex Kras, MD  Encounter Date: 06/20/2015      PT End of Session - 06/20/15 0812    Visit Number 10   Number of Visits 12   Date for PT Re-Evaluation 08/08/15   PT Start Time 0735   PT Stop Time 0817   PT Time Calculation (min) 42 min   Activity Tolerance Patient tolerated treatment well   Behavior During Therapy [facility] for tasks assessed/performed      Past Medical History  Diagnosis Date  . Essential hypertension, benign   . Mixed hyperlipidemia   . Depression   . Allergic rhinitis   . GERD (gastroesophageal reflux disease)   . Arthritis   . History of blood transfusion   . Left bundle branch block   . History of cardiac catheterization     Normal coronaries 2010  . Secondary cardiomyopathy (HCC)     LVEF 40-45%, likely nonischemic  . Anxiety   . Osteopenia   . Cataract     had eye surgery    Past Surgical History  Procedure Laterality Date  . Cholecystectomy  1991  . Tonsillectomy    . Appendectomy    . Back surgery  07/21/2008  . Rotator cuff repair  2011    right  . Cataract extraction w/phaco  04/16/2011    Procedure: CATARACT EXTRACTION PHACO AND INTRAOCULAR LENS PLACEMENT (IOC);  Surgeon: Susa Simmonds;  Location: [facility];  Service: Ophthalmology;  Laterality: Left;  CDE=11.35  . Hammer toe surgery  11/22/11    [facility], [name]  . Cataract extraction w/phaco  01/28/2012    Procedure: CATARACT EXTRACTION PHACO AND INTRAOCULAR LENS PLACEMENT (IOC);  Surgeon: Susa Simmonds, MD;  Location: [facility];  Service: Ophthalmology;  Laterality: Right;  CDI:8.15  . Cysto with hydrodistension  05/29/2012    Procedure: CYSTOSCOPY/HYDRODISTENSION;  Surgeon: Martina Sinner, MD;  Location: [facility];  Service: Urology;  Laterality: N/A;  INSTILLATION OF MARCAINE AND PYRIDIUM   . Lumbar laminectomy/decompression microdiscectomy Left 06/06/2012    Procedure: LUMBAR LAMINECTOMY/DECOMPRESSION MICRODISCECTOMY 1 LEVEL;  Surgeon: Maeola Harman, MD;  Location: [facility];  Service: Neurosurgery;  Laterality: Left;  Left Lumbar two-three Laminectomy for resection of synovial cyst  . Total hip arthroplasty Right 03/13/2013    Procedure: TOTAL HIP ARTHROPLASTY;  Surgeon: Nestor Lewandowsky, MD;  Location: [facility];  Service: Orthopedics;  Laterality: Right;  . Eye surgery Bilateral     catar  . Abdominal hysterectomy      complete  . Spine surgery      back suregry 2011  . Joint replacement      right hip replacement 2013    There were no vitals filed for this visit.  Visit Diagnosis:  Neck pain  Stiffness of multiple joints      Subjective Assessment - 06/20/15 0811    Subjective States that her neck feels better with 4/10 pain today. Requested more work into paraspinals and suboccipitals today.   Pertinent History HTN, Heart trouble, asthma   Patient Stated Goals Improve strengthe, return to recreational activities   Currently in Pain? Yes   Pain Score 4    Pain Location Neck   Pain Orientation Left   Pain Type Chronic pain   Pain  Onset More than a month ago            [facility] PT Assessment - July 07, 2015 0001    Assessment   Medical Diagnosis neck pain   Onset Date/Surgical Date 05/03/09   Precautions   Precaution Comments Rt THA                     [facility] Adult PT Treatment/Exercise - 07-07-15 0001    Modalities   Modalities Electrical Stimulation;Moist Heat;Ultrasound   Moist Heat Therapy   Number Minutes Moist Heat 15 Minutes   Moist Heat Location Cervical   Electrical Stimulation   Electrical Stimulation Location L UT/ cervical paraspinals   Electrical Stimulation Action Pre-Mod   Electrical Stimulation Parameters 80-150 Hz x15 min   Electrical  Stimulation Goals Pain   Ultrasound   Ultrasound Location L UT/ cervical paraspinals   Ultrasound Parameters 1.5 w/cm2, 100%, 1 mhz x10 min   Ultrasound Goals Pain   Manual Therapy   Manual Therapy Myofascial release   Myofascial Release MFR to L UT/ Levator Scapula/ cervical paraspinals/ suboccipitals in sitting to decrease pain and tightness                     PT Long Term Goals - 05/31/15 1606    PT LONG TERM GOAL #1   Title Improved left cervical rotation to 65 degrees or more to improve ADLS   Time 6   Period Weeks   Status On-going  L cervical rotation AROM 46 deg 05/31/2015   PT LONG TERM GOAL #2   Title decreased pain in neck to 4/10 or better with motion.   Time 6   Period Weeks   Status On-going               Plan - 2015/07/07 5597    Clinical Impression Statement Patient tolerated today's treatment well with no reports of tenderness and soreness with treatment today. Patient requested more manual therapy into cervical paraspinals and suboccipitals today and presented with tightness of the paraspinals but no complaint of pain or tenderness. Normal modalities response noted following removal of the modalities. Patient's goals remain on-going due to decreased cervical ROM and pain with rotation especially to the L. Experienced cervical region feeling "good" following treatment today.   Pt will benefit from skilled therapeutic intervention in order to improve on the following deficits Decreased range of motion;Pain;Decreased activity tolerance;Impaired flexibility;Decreased strength   Rehab Potential Good   PT Frequency 2x / week   PT Duration 6 weeks   PT Treatment/Interventions ADLs/Self Care Home Management;Cryotherapy;Electrical Stimulation;Moist Heat;Therapeutic exercise;Ultrasound;Traction;Neuromuscular re-education;Patient/family education;Manual techniques;Dry needling;Passive range of motion   PT Next Visit Plan STW to L UT/levator scap/neck. manual  distraction, cervical and scap stab; modalities prn for pain.   PT Home Exercise Plan UT, Lev scap stretches and scapular retraction   Consulted and Agree with Plan of Care Patient          G-Codes - July 07, 2015 1802    Functional Assessment Tool Used FOTO 66% LIMITED   Functional Limitation Other PT primary   Other PT Primary Current Status (C1638) At least 60 percent but less than 80 percent impaired, limited or restricted   Other PT Primary Goal Status (G5364) At least 40 percent but less than 60 percent impaired, limited or restricted      Problem List Patient Active Problem List   Diagnosis Date Noted  . Memory problem 05/07/2015  . COPD (chronic obstructive  pulmonary disease) (HCC) 04/19/2015  . Neck pain 04/06/2015  . Insomnia 12/10/2014  . BMI 28.0-28.9,adult 12/10/2014  . Osteopenia 05/19/2014  . GAD (generalized anxiety disorder) 01/06/2014  . Depression 01/06/2014  . GERD (gastroesophageal reflux disease) 01/06/2014  . Vitamin D deficiency 01/06/2014  . Overactive bladder 01/06/2014  . Secondary cardiomyopathy (HCC) 10/06/2013  . Left bundle branch block 10/06/2013  . Dizziness 10/04/2013  . Osteoarthritis of right hip 03/14/2013  . Allergic rhinitis 12/07/2008  . Chronic cough 12/07/2008  . Hepatitis B virus infection 08/16/2008  . Hyperlipidemia 08/16/2008  . Essential hypertension 08/16/2008  . Osteoarthritis 08/16/2008  . DISC DEGENERATION 07/24/2007   Physical Therapy Progress Note  Dates of Reporting Period: 05/16/15 to 05/20/15  Objective Reports of Subjective Statement: Neck feels better rated at 4/10 today.  Objective Measurements:   Goal Update: Active left cervical rotation= 46 degrees.  Plan: Continue with modalities and manual therapy.  Reason Skilled Services are Required: Patient responding well to manual therapy techniques.    Laina Guerrieri, [place] MPT 06/20/2015, 6:02 PM  [facility] [address] [place], [place], [address] Phone: [phone]   Fax:  [phone]  Name: [name] MRN: [id number] Date of Birth: June 30, 1934

## 2015-06-22 ENCOUNTER — Ambulatory Visit: Payer: Medicare Other | Admitting: Physical Therapy

## 2015-06-29 ENCOUNTER — Ambulatory Visit (INDEPENDENT_AMBULATORY_CARE_PROVIDER_SITE_OTHER): Payer: Medicare Other | Admitting: Family Medicine

## 2015-06-29 ENCOUNTER — Encounter: Payer: Self-pay | Admitting: Family Medicine

## 2015-06-29 VITALS — BP 151/83 | HR 71 | Temp 98.3°F | Ht 61.0 in | Wt 158.6 lb

## 2015-06-29 DIAGNOSIS — R0981 Nasal congestion: Secondary | ICD-10-CM

## 2015-06-29 DIAGNOSIS — R6883 Chills (without fever): Secondary | ICD-10-CM

## 2015-06-29 DIAGNOSIS — R059 Cough, unspecified: Secondary | ICD-10-CM

## 2015-06-29 DIAGNOSIS — J309 Allergic rhinitis, unspecified: Secondary | ICD-10-CM

## 2015-06-29 DIAGNOSIS — R05 Cough: Secondary | ICD-10-CM | POA: Diagnosis not present

## 2015-06-29 DIAGNOSIS — J449 Chronic obstructive pulmonary disease, unspecified: Secondary | ICD-10-CM

## 2015-06-29 LAB — POCT INFLUENZA A/B
INFLUENZA B, POC: NEGATIVE
Influenza A, POC: NEGATIVE

## 2015-06-29 MED ORDER — AZITHROMYCIN 250 MG PO TABS: ORAL_TABLET | ORAL | Status: DC

## 2015-06-29 MED ORDER — PREDNISONE 20 MG PO TABS: 40.0000 mg | ORAL_TABLET | Freq: Every day | ORAL | Status: DC

## 2015-06-29 MED ORDER — DOXYCYCLINE HYCLATE 100 MG PO TABS: 100.0000 mg | ORAL_TABLET | Freq: Two times a day (BID) | ORAL | Status: DC

## 2015-06-29 MED ORDER — CETIRIZINE HCL 10 MG PO TABS: 10.0000 mg | ORAL_TABLET | Freq: Every day | ORAL | Status: DC

## 2015-06-29 NOTE — Patient Instructions (Addendum)
Great to meet you!  Take symbicort 2 puffs twice a day regardless of how you are breathing Albuterol is a rescue inhaler   Acute Bronchitis Bronchitis is when the airways that extend from the windpipe into the lungs get red, puffy, and painful (inflamed). Bronchitis often causes thick spit (mucus) to develop. This leads to a cough. A cough is the most common symptom of bronchitis. In acute bronchitis, the condition usually begins suddenly and goes away over time (usually in 2 weeks). Smoking, allergies, and asthma can make bronchitis worse. Repeated episodes of bronchitis may cause more lung problems. HOME CARE  Rest.  Drink enough fluids to keep your pee (urine) clear or pale yellow (unless you need to limit fluids as told by your doctor).  Only take over-the-counter or prescription medicines as told by your doctor.  Avoid smoking and secondhand smoke. These can make bronchitis worse. If you are a smoker, think about using nicotine gum or skin patches. Quitting smoking will help your lungs heal faster.  Reduce the chance of getting bronchitis again by:  Washing your hands often.  Avoiding people with cold symptoms.  Trying not to touch your hands to your mouth, nose, or eyes.  Follow up with your doctor as told. GET HELP IF: Your symptoms do not improve after 1 week of treatment. Symptoms include:  Cough.  Fever.  Coughing up thick spit.  Body aches.  Chest congestion.  Chills.  Shortness of breath.  Sore throat. GET HELP RIGHT AWAY IF:   You have an increased fever.  You have chills.  You have severe shortness of breath.  You have bloody thick spit (sputum).  You throw up (vomit) often.  You lose too much body fluid (dehydration).  You have a severe headache.  You faint. MAKE SURE YOU:   Understand these instructions.  Will watch your condition.  Will get help right away if you are not doing well or get worse.   This information is not intended  to replace advice given to you by your health care provider. Make sure you discuss any questions you have with your health care provider.   Document Released: 10/03/2007 Document Revised: 12/17/2012 Document Reviewed: 10/07/2012 Elsevier Interactive Patient Education [employer]

## 2015-06-29 NOTE — Addendum Note (Signed)
Addended by: Elenora Gamma on: 06/29/2015 05:18 PM   Modules accepted: Orders

## 2015-06-29 NOTE — Progress Notes (Addendum)
   HPI  Patient presents today here for cough.  Patient claims that over last 5 days she's had cough, congestion, and chills.  Her cough is productive of thick white sputum She's also had increased shortness of breath which is helped by albuterol. She denies any difficulty tolerating fluids, her appetite is decreased. She denies any chest pain. She is  taking her medications as prescribed  PMH: Smoking status noted ROS: Per HPI  Objective: BP 151/83 mmHg  Pulse 71  Temp(Src) 98.3 F (36.8 C) (Oral)  Ht 5\' 1"  (1.549 m)  Wt 158 lb 9.6 oz (71.94 kg)  BMI 29.98 kg/m2 Gen: NAD, alert, cooperative with exam HEENT: NCAT, nares with swollen turbinates bilaterally, TMs normal bilaterally, oropharynx clear CV: RRR, good S1/S2, no murmur Resp: CTABL, no wheezes, non-labored Ext: No edema, warm Neuro: Alert and oriented, No gross deficits  Assessment and plan:  # COPD exacerbation Treat with doxycyline and short course of steroids. Supportive care discussed Return to clinic with any concerning symptoms, or failure to improve as expected  Initially sent azithro, however there was a warning about Qt prolongation, her QTc is 546, so I changed to doxy. She was updated and her pharmacy was called.     Orders Placed This Encounter  Procedures  . POCT Influenza A/B    Meds ordered this encounter  Medications  . azithromycin (ZITHROMAX) 250 MG tablet    Sig: Take 2 tablets on day 1 and 1 tablet daily after that    Dispense:  6 tablet    Refill:  0  . predniSONE (DELTASONE) 20 MG tablet    Sig: Take 2 tablets (40 mg total) by mouth daily with breakfast.    Dispense:  10 tablet    Refill:  0  . cetirizine (ZYRTEC) 10 MG tablet    Sig: Take 1 tablet (10 mg total) by mouth daily.    Dispense:  90 tablet    Refill:  3  . doxycycline (VIBRA-TABS) 100 MG tablet    Sig: Take 1 tablet (100 mg total) by mouth 2 (two) times daily. 1 po bid    Dispense:  20 tablet    Refill:  0   Please disregard azithro- changed due to QT prolongation    , MD Murtis Sink [facility] 06/29/2015, 5:17 PM

## 2015-07-05 ENCOUNTER — Other Ambulatory Visit: Payer: Self-pay | Admitting: Pediatrics

## 2015-07-12 ENCOUNTER — Ambulatory Visit (INDEPENDENT_AMBULATORY_CARE_PROVIDER_SITE_OTHER): Payer: Medicare Other | Admitting: Pediatrics

## 2015-07-12 ENCOUNTER — Encounter: Payer: Self-pay | Admitting: Pediatrics

## 2015-07-12 VITALS — BP 141/71 | HR 57 | Temp 97.0°F | Ht 61.0 in | Wt 157.0 lb

## 2015-07-12 DIAGNOSIS — N644 Mastodynia: Secondary | ICD-10-CM

## 2015-07-12 NOTE — Progress Notes (Signed)
Subjective:    Patient ID: [name], female    DOB: July 19, 1934, 81 y.o.   MRN: [id number]  CC: Breast Pain   HPI: [name] is a 81 y.o. female presenting for Breast Pain  Pain under her L breast, throbbing Didn't sleep any last night because of the pain Does not feel SOB Ongoing a week No fevers Appetite is fine    Depression screen [facility] 2/9 07/12/2015 06/16/2015 05/04/2015 04/19/2015 04/06/2015  Decreased Interest 0 0 0 0 0  Down, Depressed, Hopeless 0 0 0 0 0  PHQ - 2 Score 0 0 0 0 0  Altered sleeping - - - - -  Tired, decreased energy - - - - -  Change in appetite - - - - -  Feeling bad or failure about yourself  - - - - -  Trouble concentrating - - - - -  Moving slowly or fidgety/restless - - - - -  Suicidal thoughts - - - - -  PHQ-9 Score - - - - -     Relevant past medical, surgical, family and social history reviewed and updated as indicated. Interim medical history since our last visit reviewed. Allergies and medications reviewed and updated.    ROS: Per HPI unless specifically indicated above  History  Smoking status  . Current Some Day Smoker -- 0.10 packs/day for 40 years  . Types: Cigarettes  Smokeless tobacco  . Never Used    Comment: smokes 2 cigarettes a day    Past Medical History Patient Active Problem List   Diagnosis Date Noted  . Memory problem 05/07/2015  . COPD (chronic obstructive pulmonary disease) (HCC) 04/19/2015  . Neck pain 04/06/2015  . Insomnia 12/10/2014  . BMI 28.0-28.9,adult 12/10/2014  . Osteopenia 05/19/2014  . GAD (generalized anxiety disorder) 01/06/2014  . Depression 01/06/2014  . GERD (gastroesophageal reflux disease) 01/06/2014  . Vitamin D deficiency 01/06/2014  . Overactive bladder 01/06/2014  . Secondary cardiomyopathy (HCC) 10/06/2013  . Left bundle branch block 10/06/2013  . Dizziness 10/04/2013  . Osteoarthritis of right hip 03/14/2013  . Allergic rhinitis 12/07/2008  . Chronic cough  12/07/2008  . Hepatitis B virus infection 08/16/2008  . Hyperlipidemia 08/16/2008  . Essential hypertension 08/16/2008  . Osteoarthritis 08/16/2008  . DISC DEGENERATION 07/24/2007        Objective:    BP 141/71 mmHg  Pulse 57  Temp(Src) 97 F (36.1 C) (Oral)  Ht 5\' 1"  (1.549 m)  Wt 157 lb (71.215 kg)  BMI 29.68 kg/m2  Wt Readings from Last 3 Encounters:  07/12/15 157 lb (71.215 kg)  06/29/15 158 lb 9.6 oz (71.94 kg)  06/16/15 156 lb 12.8 oz (71.124 kg)    Gen: NAD, alert, cooperative with exam, NCAT EYES: EOMI, no scleral injection or icterus LYMPH: no axiallary LAD CV: NRRR, normal S1/S2, no murmur Resp: CTABL, no wheezes, normal WOB Abd: +BS, soft, NTND. Ext: No edema, warm Neuro: Alert and oriented, strength equal b/l UE and LE, coordination grossly normal Breast: tender L lower breast midline when pushing up on breast tissue, no redness, no swelling, no lymph nodes, dense breast tissue b/l, no induration     Assessment & Plan:    Nick was seen today for breast pain.  Diagnoses and all orders for this visit:  Breast pain, left -     MM Digital Diagnostic Unilat L; Future    Follow up plan: Return if symptoms worsen or fail to improve.  [name]  Oswaldo Done, MD [facility] 07/12/2015, 4:02 PM

## 2015-07-15 ENCOUNTER — Ambulatory Visit: Payer: Medicare Other | Admitting: Pediatrics

## 2015-07-19 ENCOUNTER — Telehealth: Payer: Self-pay | Admitting: Cardiology

## 2015-07-19 NOTE — Telephone Encounter (Signed)
She need information,she will fax it over.

## 2015-07-20 ENCOUNTER — Ambulatory Visit: Payer: Medicare Other | Admitting: Pediatrics

## 2015-07-20 NOTE — Telephone Encounter (Signed)
Acknowledged.

## 2015-07-21 ENCOUNTER — Encounter: Payer: Self-pay | Admitting: Pediatrics

## 2015-08-02 ENCOUNTER — Other Ambulatory Visit: Payer: Self-pay | Admitting: Pediatrics

## 2015-08-02 ENCOUNTER — Telehealth: Payer: Self-pay | Admitting: Cardiology

## 2015-08-02 NOTE — Telephone Encounter (Signed)
Boneta Lucks called in stating that she faxed over a form for the patient to enroll in to the heart failure program. She says she faxed it over on 3/21 and has not received a response. She is requesting   - blood pressure and heart rate readings  - recent ejection fraction   - diagnosis of heart failure.  Please f/u with her  Thanks

## 2015-08-02 NOTE — Telephone Encounter (Signed)
Returned call to [place] with [facility] no answer.Left message on personal voice mail spoke to [facility] Dr.Hochrein's CMA she never received form for heart failure program.Advised to re fax form to [phone].Tamela Oddi will get signature from Dr.Hochrein on Thurs 08/04/15 and fax back.

## 2015-08-05 NOTE — Telephone Encounter (Signed)
Received form, form was filled out and Dr Antoine Poche sign, and faxed back to Cyril Loosen at [employer].

## 2015-08-08 NOTE — Telephone Encounter (Signed)
Patient will call me back and let me know exactly what she needs.

## 2015-08-09 ENCOUNTER — Encounter: Payer: Self-pay | Admitting: Family Medicine

## 2015-08-09 ENCOUNTER — Ambulatory Visit (INDEPENDENT_AMBULATORY_CARE_PROVIDER_SITE_OTHER): Payer: Medicare Other | Admitting: Family Medicine

## 2015-08-09 VITALS — BP 160/77 | HR 87 | Temp 97.9°F | Ht 61.0 in | Wt 158.8 lb

## 2015-08-09 DIAGNOSIS — J309 Allergic rhinitis, unspecified: Secondary | ICD-10-CM

## 2015-08-09 DIAGNOSIS — F0390 Unspecified dementia without behavioral disturbance: Secondary | ICD-10-CM

## 2015-08-09 MED ORDER — CARVEDILOL 6.25 MG PO TABS: 6.2500 mg | ORAL_TABLET | Freq: Two times a day (BID) | ORAL | Status: DC

## 2015-08-09 NOTE — Progress Notes (Addendum)
   HPI  Patient presents today for memory loss.  She and her daughter explained that over the last 5 months she seems be getting more and more forgetful. She often goes into a room and forgets why she came there, she forgets where she places money. She's had one episode of forgetting where she isn't getting lost. She has not had any disruptive behavior, she has not had any dangerous episodes-specifically she has not locked left the stove on, and she is not getting lost when she drives.  She denies severe depression at this time. Hypertension She has been out of her Coreg for a few days. No chest pain  No history of stroke  PMH: Smoking status noted ROS: Per HPI  Objective: BP 160/77 mmHg  Pulse 87  Temp(Src) 97.9 F (36.6 C) (Oral)  Ht _0  (1.549 m)  Wt 158 lb 12.8 oz (72.031 kg)  BMI 30.02 kg/m2 Gen: NAD, alert, cooperative with exam HEENT: NCAT CV: RRR, good S1/S2, no murmur Resp: CTABL, no wheezes, non-labored Ext: No edema, warm Neuro: Alert and oriented, No gross deficits  MMSE - Mini Mental State Exam 08/09/2015 08/09/2014 05/18/2013  Orientation to time _1 Orientation to Place _2 Registration _3 Attention/ Calculation _4 Recall _5 Language- name 2 objects _6 Language- repeat _7 Language- follow 3 step command _8 Language- read & follow direction _9 Write a sentence _10 Copy design 1 0 1  Total score _11 Assessment and plan:  # Memory loss, likely dementia Labs including CMP for Ca, Folate, B12 MRI ordered Discussed likely aricept is not a good idea with cardiac pathology  # post nasal drip, allergic rhinitis Start flonase  Discussed attempting to DC elavil, trial of melatonin. Trying to decrease anticholinergic side effects.      Orders Placed This Encounter  Procedures  . MR Brain Wo Contrast    Standing Status: Future     Number of Occurrences:      Standing Expiration Date: 10/08/2016    Order Specific Question:  Reason for Exam (SYMPTOM  OR DIAGNOSIS REQUIRED)    Answer:  memory loss, dementia    Order Specific Question:  Preferred imaging location?    Answer:  [facility] (table limit-350lbs)    Order Specific Question:  What is the patient's sedation requirement?    Answer:  No Sedation    Order Specific Question:  Does the patient have a pacemaker or implanted devices?    Answer:  No  . CBC with Differential  . CMP14+EGFR  . Vitamin B12  . [place], MD [facility] 08/09/2015, 4:49 PM

## 2015-08-09 NOTE — Patient Instructions (Signed)
Great to meet you!  We will call within 1 week with lab results  We will help you arrange the MRI of your brain.

## 2015-08-10 ENCOUNTER — Telehealth: Payer: Self-pay | Admitting: Pediatrics

## 2015-08-10 LAB — CMP14+EGFR
A/G RATIO: 1.6 (ref 1.2–2.2)
ALBUMIN: 4 g/dL (ref 3.5–4.8)
ALK PHOS: 99 IU/L (ref 39–117)
ALT: 20 IU/L (ref 0–32)
AST: 28 IU/L (ref 0–40)
BUN/Creatinine Ratio: 11 — ABNORMAL LOW (ref 12–28)
BUN: 12 mg/dL (ref 8–27)
CHLORIDE: 107 mmol/L — AB (ref 96–106)
CO2: 25 mmol/L (ref 18–29)
CREATININE: 1.09 mg/dL — AB (ref 0.57–1.00)
Calcium: 8.9 mg/dL (ref 8.7–10.3)
GFR calc Af Amer: 56 mL/min/{1.73_m2} — ABNORMAL LOW (ref >59)
GFR calc non Af Amer: 49 mL/min/{1.73_m2} — ABNORMAL LOW (ref >59)
GLOBULIN, TOTAL: 2.5 g/dL (ref 1.5–4.5)
Glucose: 65 mg/dL (ref 65–99)
POTASSIUM: 4 mmol/L (ref 3.5–5.2)
SODIUM: 146 mmol/L — AB (ref 134–144)
Total Protein: 6.5 g/dL (ref 6.0–8.5)

## 2015-08-10 LAB — CBC WITH DIFFERENTIAL/PLATELET
BASOS ABS: 0 10*3/uL (ref 0.0–0.2)
BASOS: 0 %
EOS (ABSOLUTE): 0.1 10*3/uL (ref 0.0–0.4)
Eos: 1 %
Hematocrit: 37.8 % (ref 34.0–46.6)
Hemoglobin: 12.7 g/dL (ref 11.1–15.9)
Immature Grans (Abs): 0 10*3/uL (ref 0.0–0.1)
Immature Granulocytes: 0 %
Lymphocytes Absolute: 2.6 10*3/uL (ref 0.7–3.1)
Lymphs: 33 %
MCH: 29.5 pg (ref 26.6–33.0)
MCHC: 33.6 g/dL (ref 31.5–35.7)
MCV: 88 fL (ref 79–97)
MONOS ABS: 0.4 10*3/uL (ref 0.1–0.9)
Monocytes: 5 %
NEUTROS ABS: 4.9 10*3/uL (ref 1.4–7.0)
NEUTROS PCT: 61 %
Platelets: 325 10*3/uL (ref 150–379)
RBC: 4.3 x10E6/uL (ref 3.77–5.28)
RDW: 15.1 % (ref 12.3–15.4)
WBC: 7.9 10*3/uL (ref 3.4–10.8)

## 2015-08-10 LAB — VITAMIN B12

## 2015-08-10 LAB — FOLATE: FOLATE: 10.1 ng/mL (ref >3.0)

## 2015-08-11 ENCOUNTER — Ambulatory Visit (HOSPITAL_COMMUNITY)
Admission: RE | Admit: 2015-08-11 | Discharge: 2015-08-11 | Disposition: A | Discharge status: Home / Self Care | Payer: Medicare Other | Source: Ambulatory Visit | Attending: Family Medicine | Admitting: Family Medicine

## 2015-08-11 ENCOUNTER — Other Ambulatory Visit: Payer: Self-pay | Admitting: *Deleted

## 2015-08-11 ENCOUNTER — Other Ambulatory Visit: Payer: Self-pay | Admitting: Nurse Practitioner

## 2015-08-11 DIAGNOSIS — N644 Mastodynia: Secondary | ICD-10-CM

## 2015-08-11 DIAGNOSIS — F0390 Unspecified dementia without behavioral disturbance: Secondary | ICD-10-CM | POA: Diagnosis not present

## 2015-08-11 DIAGNOSIS — G9389 Other specified disorders of brain: Secondary | ICD-10-CM | POA: Insufficient documentation

## 2015-08-11 DIAGNOSIS — F0280 Dementia in other diseases classified elsewhere without behavioral disturbance: Secondary | ICD-10-CM

## 2015-08-11 DIAGNOSIS — G308 Other Alzheimer's disease: Principal | ICD-10-CM

## 2015-08-11 DIAGNOSIS — R413 Other amnesia: Secondary | ICD-10-CM | POA: Insufficient documentation

## 2015-08-11 NOTE — Telephone Encounter (Signed)
Spoke with daughter; Pt will keep the appointment that was given to her

## 2015-08-12 ENCOUNTER — Encounter (HOSPITAL_COMMUNITY): Payer: Self-pay | Admitting: Emergency Medicine

## 2015-08-12 ENCOUNTER — Emergency Department (HOSPITAL_COMMUNITY): Payer: Medicare Other

## 2015-08-12 ENCOUNTER — Emergency Department (HOSPITAL_COMMUNITY)
Admission: EM | Admit: 2015-08-12 | Discharge: 2015-08-12 | Disposition: A | Discharge status: Home / Self Care | Payer: Medicare Other | Attending: Emergency Medicine | Admitting: Emergency Medicine

## 2015-08-12 ENCOUNTER — Encounter (HOSPITAL_COMMUNITY): Payer: Self-pay | Admitting: *Deleted

## 2015-08-12 ENCOUNTER — Emergency Department (HOSPITAL_COMMUNITY)
Admission: EM | Admit: 2015-08-12 | Discharge: 2015-08-13 | Disposition: A | Discharge status: Home / Self Care | Payer: Medicare Other | Source: Home / Self Care | Attending: Emergency Medicine | Admitting: Emergency Medicine

## 2015-08-12 DIAGNOSIS — S79911A Unspecified injury of right hip, initial encounter: Secondary | ICD-10-CM | POA: Diagnosis present

## 2015-08-12 DIAGNOSIS — Z79899 Other long term (current) drug therapy: Secondary | ICD-10-CM | POA: Diagnosis not present

## 2015-08-12 DIAGNOSIS — Z87891 Personal history of nicotine dependence: Secondary | ICD-10-CM | POA: Insufficient documentation

## 2015-08-12 DIAGNOSIS — E782 Mixed hyperlipidemia: Secondary | ICD-10-CM

## 2015-08-12 DIAGNOSIS — F329 Major depressive disorder, single episode, unspecified: Secondary | ICD-10-CM

## 2015-08-12 DIAGNOSIS — M25551 Pain in right hip: Secondary | ICD-10-CM

## 2015-08-12 DIAGNOSIS — Y9289 Other specified places as the place of occurrence of the external cause: Secondary | ICD-10-CM | POA: Insufficient documentation

## 2015-08-12 DIAGNOSIS — Y939 Activity, unspecified: Secondary | ICD-10-CM | POA: Diagnosis not present

## 2015-08-12 DIAGNOSIS — Y999 Unspecified external cause status: Secondary | ICD-10-CM | POA: Insufficient documentation

## 2015-08-12 DIAGNOSIS — S76011A Strain of muscle, fascia and tendon of right hip, initial encounter: Secondary | ICD-10-CM | POA: Diagnosis not present

## 2015-08-12 DIAGNOSIS — X58XXXA Exposure to other specified factors, initial encounter: Secondary | ICD-10-CM | POA: Insufficient documentation

## 2015-08-12 DIAGNOSIS — I10 Essential (primary) hypertension: Secondary | ICD-10-CM | POA: Insufficient documentation

## 2015-08-12 DIAGNOSIS — M1990 Unspecified osteoarthritis, unspecified site: Secondary | ICD-10-CM | POA: Diagnosis not present

## 2015-08-12 MED ORDER — HYDROMORPHONE HCL 1 MG/ML IJ SOLN
0.5000 mg | Freq: Once | INTRAMUSCULAR | Status: AC
  Administered 2015-08-12: 0.5 mg via INTRAMUSCULAR
  Filled 2015-08-12: qty 1

## 2015-08-12 MED ORDER — HYDROCODONE-ACETAMINOPHEN 5-325 MG PO TABS: 1.0000 | ORAL_TABLET | Freq: Four times a day (QID) | ORAL | Status: DC | PRN

## 2015-08-12 MED ORDER — HYDROMORPHONE HCL 1 MG/ML IJ SOLN
1.0000 mg | Freq: Once | INTRAMUSCULAR | Status: AC
  Administered 2015-08-12: 1 mg via INTRAMUSCULAR
  Filled 2015-08-12: qty 1

## 2015-08-12 NOTE — ED Notes (Signed)
Pt transferred from wheelchair to stretcher with minimal assistance

## 2015-08-12 NOTE — Discharge Instructions (Signed)
Your right hip pain is likely due to a muscle strain. Please use icy hot, warm compress, and gentle massage to help ease the pain. Take Vicodin as needed for pain and follow-up closely with your orthopedist for further care.  Muscle Strain A muscle strain is an injury that occurs when a muscle is stretched beyond its normal length. Usually a small number of muscle fibers are torn when this happens. Muscle strain is rated in degrees. First-degree strains have the least amount of muscle fiber tearing and pain. Second-degree and third-degree strains have increasingly more tearing and pain.  Usually, recovery from muscle strain takes 1-2 weeks. Complete healing takes 5-6 weeks.  CAUSES  Muscle strain happens when a sudden, violent force placed on a muscle stretches it too far. This may occur with lifting, sports, or a fall.  RISK FACTORS Muscle strain is especially common in athletes.  SIGNS AND SYMPTOMS At the site of the muscle strain, there may be:  Pain.  Bruising.  Swelling.  Difficulty using the muscle due to pain or lack of normal function. DIAGNOSIS  Your health care provider will perform a physical exam and ask about your medical history. TREATMENT  Often, the best treatment for a muscle strain is resting, icing, and applying cold compresses to the injured area.  HOME CARE INSTRUCTIONS   Use the PRICE method of treatment to promote muscle healing during the first 2-3 days after your injury. The PRICE method involves:  Protecting the muscle from being injured again.  Restricting your activity and resting the injured body part.  Icing your injury. To do this, put ice in a plastic bag. Place a towel between your skin and the bag. Then, apply the ice and leave it on from 15-20 minutes each hour. After the third day, switch to moist heat packs.  Apply compression to the injured area with a splint or elastic bandage. Be careful not to wrap it too tightly. This may interfere with  blood circulation or increase swelling.  Elevate the injured body part above the level of your heart as often as you can.  Only take over-the-counter or prescription medicines for pain, discomfort, or fever as directed by your health care provider.  Warming up prior to exercise helps to prevent future muscle strains. SEEK MEDICAL CARE IF:   You have increasing pain or swelling in the injured area.  You have numbness, tingling, or a significant loss of strength in the injured area. MAKE SURE YOU:   Understand these instructions.  Will watch your condition.  Will get help right away if you are not doing well or get worse.   This information is not intended to replace advice given to you by your health care provider. Make sure you discuss any questions you have with your health care provider.   Document Released: 04/16/2005 Document Revised: 02/04/2013 Document Reviewed: 11/13/2012 Elsevier Interactive Patient Education [employer]

## 2015-08-12 NOTE — ED Notes (Signed)
Pt returns to the ED for continued pain in her right hip.

## 2015-08-12 NOTE — ED Provider Notes (Signed)
CSN: [id number]     Arrival date & time 08/12/15  2307 History   First MD Initiated Contact with Patient 08/12/15 2317     Chief Complaint  Patient presents with  . Hip Pain     (Consider location/radiation/quality/duration/timing/severity/associated sxs/prior Treatment) HPI   This is an obese female with history of arthritis and osteopenia with right hip replacement in 2011 presents with right hip pain. Patient was seen by me earlier today for her hip pain. She described pain as a sharp pain to right hip that she noticed when she turns in bed this morning. She initially took Advil that provide no adequate relief. No associated fever, numbness weakness, bowel bladder incontinence or saddle anesthesia. After my evaluation I felt that this is likely a muscle strain. Patient was given pain medication in the ED as well as Vicodin to go home. Once she got home after ER visit her pain return prompting daughter to bring her back. There will expressing concern that there may be signs of skin infection as her right hip appear red.  Past Medical History  Diagnosis Date  . Essential hypertension, benign   . Mixed hyperlipidemia   . Depression   . Allergic rhinitis   . GERD (gastroesophageal reflux disease)   . Arthritis   . History of blood transfusion   . Left bundle branch block   . History of cardiac catheterization     Normal coronaries 2010  . Secondary cardiomyopathy (HCC)     LVEF 40-45%, likely nonischemic  . Anxiety   . Osteopenia   . Cataract     had eye surgery   Past Surgical History  Procedure Laterality Date  . Cholecystectomy  1991  . Tonsillectomy    . Appendectomy    . Back surgery  07/21/2008  . Rotator cuff repair  2011    right  . Cataract extraction w/phaco  04/16/2011    Procedure: CATARACT EXTRACTION PHACO AND INTRAOCULAR LENS PLACEMENT (IOC);  Surgeon: Susa Simmonds;  Location: [facility];  Service: Ophthalmology;  Laterality: Left;  CDE=11.35  . Hammer toe  surgery  11/22/11    [facility], [name]  . Cataract extraction w/phaco  01/28/2012    Procedure: CATARACT EXTRACTION PHACO AND INTRAOCULAR LENS PLACEMENT (IOC);  Surgeon: Susa Simmonds, MD;  Location: [facility];  Service: Ophthalmology;  Laterality: Right;  CDI:8.15  . Cysto with hydrodistension  05/29/2012    Procedure: CYSTOSCOPY/HYDRODISTENSION;  Surgeon: Martina Sinner, MD;  Location: [facility];  Service: Urology;  Laterality: N/A;  INSTILLATION OF MARCAINE AND PYRIDIUM   . Lumbar laminectomy/decompression microdiscectomy Left 06/06/2012    Procedure: LUMBAR LAMINECTOMY/DECOMPRESSION MICRODISCECTOMY 1 LEVEL;  Surgeon: Maeola Harman, MD;  Location: [facility];  Service: Neurosurgery;  Laterality: Left;  Left Lumbar two-three Laminectomy for resection of synovial cyst  . Total hip arthroplasty Right 03/13/2013    Procedure: TOTAL HIP ARTHROPLASTY;  Surgeon: Nestor Lewandowsky, MD;  Location: [facility];  Service: Orthopedics;  Laterality: Right;  . Eye surgery Bilateral     catar  . Abdominal hysterectomy      complete  . Spine surgery      back suregry 2011  . Joint replacement      right hip replacement 2013   Family History  Problem Relation Age of Onset  . Colon cancer Neg Hx   . Anesthesia problems Neg Hx   . Hypotension Neg Hx   . Malignant hyperthermia Neg Hx   . Pseudochol  deficiency Neg Hx   . Diabetes Other   . Allergies Other   . Diabetes Father   . Diabetes Brother   . Cancer Brother 8    colon  . Diabetes Brother   . Diabetes Brother   . Hypertension Sister   . Diabetes Sister   . Diabetes Sister    Social History  Substance Use Topics  . Smoking status: Former Smoker -- 0.10 packs/day for 40 years    Types: Cigarettes  . Smokeless tobacco: Never Used     Comment: smokes 2 cigarettes a day  . Alcohol Use: No   OB History    No data available     Review of Systems  Constitutional: Negative for fever.  Musculoskeletal: Positive for arthralgias.  Skin:  Negative for rash and wound.      Allergies  Lisinopril-hydrochlorothiazide; Codeine; and Penicillins  Home Medications   Prior to Admission medications   Medication Sig Start Date End Date Taking? Authorizing Provider  albuterol (PROVENTIL HFA;VENTOLIN HFA) 108 (90 Base) MCG/ACT inhaler Inhale 2 puffs into the lungs every 6 (six) hours as needed for wheezing or shortness of breath. 05/04/15   [name], MD  amitriptyline (ELAVIL) 25 MG tablet Take 1 tablet (25 mg total) by mouth at bedtime. 12/10/14   Mary-Margaret Daphine Deutscher, FNP  aspirin EC 81 MG tablet Take 81 mg by mouth daily.    Historical Provider, MD  Biotin 5000 MCG CAPS Take 1 capsule by mouth daily.    Historical Provider, MD  budesonide-formoterol (SYMBICORT) 160-4.5 MCG/ACT inhaler Inhale 2 puffs into the lungs 2 (two) times daily. 05/04/15   [name], MD  buPROPion (WELLBUTRIN SR) 150 MG 12 hr tablet Take 1 tablet (150 mg total) by mouth 2 (two) times daily. 12/10/14   Mary-Margaret Daphine Deutscher, FNP  calcium citrate-vitamin D (CITRACAL+D) 315-200 MG-UNIT per tablet Take 2 tablets by mouth daily.     Historical Provider, MD  carvedilol (COREG) 6.25 MG tablet Take 1 tablet (6.25 mg total) by mouth 2 (two) times daily with a meal. 08/09/15   Elenora Gamma, MD  cetirizine (ZYRTEC) 10 MG tablet Take 1 tablet (10 mg total) by mouth daily. 06/29/15   Elenora Gamma, MD  citalopram (CELEXA) 10 MG tablet Take 1 tablet (10 mg total) by mouth daily. 06/16/15   [name], MD  esomeprazole (NEXIUM) 40 MG capsule Take 1 capsule (40 mg total) by mouth daily before breakfast. 12/10/14   Mary-Margaret Daphine Deutscher, FNP  fluticasone (FLONASE) 50 MCG/ACT nasal spray Place 2 sprays into both nostrils daily. 02/09/15   Elige Radon Dettinger, MD  furosemide (LASIX) 20 MG tablet Take 0.5 tablets (10 mg total) by mouth daily. Patient taking differently: Take 20 mg by mouth daily.  12/10/14   Mary-Margaret Daphine Deutscher, FNP  HYDROcodone-acetaminophen  (NORCO/VICODIN) 5-325 MG tablet Take 1 tablet by mouth every 6 (six) hours as needed for moderate pain or severe pain. 08/12/15   Fayrene Helper, PA-C  ibuprofen (ADVIL,MOTRIN) 800 MG tablet TAKE  ONE TABLET BY MOUTH EVERY 8 HOURS AS NEEDED 05/24/15   [name], MD  losartan (COZAAR) 50 MG tablet TAKE 1 TABLET (50 MG TOTAL) BY MOUTH DAILY. Patient taking differently: Take 25 mg by mouth daily.  12/10/14   Mary-Margaret Daphine Deutscher, FNP  ondansetron (ZOFRAN) 4 MG tablet TAKE 1 TABLET (4 MG TOTAL) BY MOUTH EVERY 12 (TWELVE) HOURS AS NEEDED FOR NAUSEA. 07/05/15   [name], MD  raloxifene (EVISTA) 60 MG  tablet Take 1 tablet (60 mg total) by mouth daily. 12/10/14   Mary-Margaret Daphine Deutscher, FNP  ranitidine (ZANTAC) 150 MG tablet TAKE ONE TABLET BY MOUTH IN THE EVENING 02/10/15   Mary-Margaret Daphine Deutscher, FNP  simvastatin (ZOCOR) 10 MG tablet TAKE 1 TABLET (10 MG TOTAL) BY MOUTH AT BEDTIME. 12/10/14   Mary-Margaret Daphine Deutscher, FNP  vitamin B-12 (CYANOCOBALAMIN) 1000 MCG tablet Take 0.5 tablets (500 mcg total) by mouth daily. 06/16/15   [name], MD  Vitamin D, Ergocalciferol, (DRISDOL) 50000 UNITS CAPS capsule Take 1 capsule (50,000 Units total) by mouth once a week. Patient taking differently: Take 50,000 Units by mouth every Monday.  12/10/14   Mary-Margaret Daphine Deutscher, FNP   BP 126/86 mmHg  Pulse 73  Temp(Src) 97.7 F (36.5 C) (Oral)  Resp 16  SpO2 92% Physical Exam  Constitutional: She appears well-developed and well-nourished. No distress.  HENT:  Head: Atraumatic.  Eyes: Conjunctivae are normal.  Neck: Neck supple.  Abdominal: Soft. There is no tenderness.  Musculoskeletal: She exhibits tenderness (Right hip: Tenderness to lateral hip at the greater trochanter region increasing with hip flexion. Full range of motion with no obvious deformity. Well-healing hip surgical wound, no overlying skin changes.).  No significant midline spine tenderness on palpation.  Neurological: She is alert.  Patellar deep  tendon reflex 2+ with brisk cap refill and no foot drop.  Skin: No rash noted.  Psychiatric: She has a normal mood and affect.  Nursing note and vitals reviewed.   ED Course  Procedures (including critical care time) Labs Review Labs Reviewed - No data to display  Imaging Review Mr Brain Wo Contrast  08/11/2015  CLINICAL DATA:  81 year old female with 6 months of memory loss, cognitive difficulty. No known injury. Initial encounter. EXAM: MRI HEAD WITHOUT CONTRAST TECHNIQUE: Multiplanar, multiecho pulse sequences of the brain and surrounding structures were obtained without intravenous contrast. COMPARISON:  Head CT without contrast 10/04/2013. FINDINGS: There is disproportionate biparietal cerebral volume loss (series 3 image 6 on the right and image 16 on the left. Elsewhere cerebral volume is normal for age. No restricted diffusion to suggest acute infarction. No midline shift, mass effect, evidence of mass lesion, ventriculomegaly, extra-axial collection or acute intracranial hemorrhage. Cervicomedullary junction and pituitary are within normal limits. Major intracranial vascular flow voids are within normal limits. Minimal to mild for age nonspecific cerebral white matter T2 and FLAIR hyperintensity. No cortical encephalomalacia or chronic cerebral blood products. Deep gray matter nuclei, brainstem, and cerebellum are within normal limits. Visible internal auditory structures appear normal. Bilateral temporal lobe structures appear normal for age. Mastoids are clear. Paranasal sinuses are clear. Postoperative changes to the globes. Otherwise negative orbit and scalp soft tissues. Advanced left side C1-C2 degeneration in the visualized upper cervical spine, which otherwise demonstrates age-appropriate degeneration. IMPRESSION: 1. Disproportionate biparietal cerebral volume loss which can be seen with Alzheimer type dementia. 2. Otherwise unremarkable for age noncontrast MRI appearance of the brain.  3. Advanced left C1-C2 chronic cervical spine degeneration. Electronically Signed   By: Odessa Fleming M.D.   On: 08/11/2015 13:46   Dg Hip Unilat With Pelvis 2-3 Views Right  08/12/2015  CLINICAL DATA:  Right hip pain after feeling a popping sensation when turning over in bed last night. EXAM: DG HIP (WITH OR WITHOUT PELVIS) 2-3V RIGHT COMPARISON:  None. FINDINGS: A right hip bipolar prosthesis is in place. No fracture or dislocation seen. Postsurgical changes in the lower lumbar spine, including fixation hardware and interbody bone plug. IMPRESSION:  No fracture or dislocation. Electronically Signed   By: Beckie Salts M.D.   On: 08/12/2015 17:13   I have personally reviewed and evaluated these images and lab results as part of my medical decision-making.   EKG Interpretation None      MDM   Final diagnoses:  Right hip pain    BP 126/86 mmHg  Pulse 73  Temp(Src) 97.7 F (36.5 C) (Oral)  Resp 16  SpO2 92%   11:32 PM Patient returned to the ED with continued pain to her right hip after been seen by me early in the day. I have reexamined her hip and her pain is consistence with prior pain. She has no evidence of cellulitis or abscess on skin examination. She is neurovascularly intact. I will provide pain medication and will also give muscle relaxant as it may provide some additional pain control. I did discuss that these medication can increase the risk of drowsiness and also increased risk of fall. At this time I have low suspicion for infectious etiology causing her hip pain. Her R hip x-ray today shows no acute finding.  12:41 AM During the visit earlier today pt received 0.5mg  of Dilaudid but report pain shortly after discharge.  She received an additional 1mg  of Dilaudid during this visit.  Her pain became much more tolerable.  She did became drowsy and her O2 sats drops.  It improves after [place] O2 given and now her O2 sats has normalized.  She is now able to ambulate.  i will prescribe a  short course of muscle relaxant, and pt will f/u with her pcp or orthopedist next week for further care.    , PA-C 08/13/15 08/15/15  [id number], MD 08/13/15 [phone]

## 2015-08-12 NOTE — ED Notes (Signed)
Pt reports RT hip pain that began last night when she was turning in her bed. Pt had a RT hip replacement in 2011. Pt reports pain with ambulation. No deformity or rotation noted at this time.

## 2015-08-12 NOTE — ED Provider Notes (Addendum)
CSN: [id number]     Arrival date & time 08/12/15  1636 History   First MD Initiated Contact with Patient 08/12/15 1720     Chief Complaint  Patient presents with  . Hip Pain     (Consider location/radiation/quality/duration/timing/severity/associated sxs/prior Treatment) HPI   81 year old obese female with history of arthritis, osteopenia, right hip replacement in 2011 presenting with complaints of right hip pain. Patient report this morning when she was trying to get out of bed and as she was turning to the side she felt safe sharp pain to her right hip that has been present since. She described pain as 9 out of 10, nonradiating, worsening with movement. She took one Advil earlier today which provides minimal relief. She was able to ambulate but movement hurts. She denies having any back pain knee or ankle pain. No bowel bladder incontinence or saddle anesthesia. She has not had any trouble with her right hip since her replacement. No associated fever, chills, or rash. She normally does walk with a cane. She is here accompanied by her daughter who is out of town.  Past Medical History  Diagnosis Date  . Essential hypertension, benign   . Mixed hyperlipidemia   . Depression   . Allergic rhinitis   . GERD (gastroesophageal reflux disease)   . Arthritis   . History of blood transfusion   . Left bundle branch block   . History of cardiac catheterization     Normal coronaries 2010  . Secondary cardiomyopathy (HCC)     LVEF 40-45%, likely nonischemic  . Anxiety   . Osteopenia   . Cataract     had eye surgery   Past Surgical History  Procedure Laterality Date  . Cholecystectomy  1991  . Tonsillectomy    . Appendectomy    . Back surgery  07/21/2008  . Rotator cuff repair  2011    right  . Cataract extraction w/phaco  04/16/2011    Procedure: CATARACT EXTRACTION PHACO AND INTRAOCULAR LENS PLACEMENT (IOC);  Surgeon: Susa Simmonds;  Location: [facility];  Service: Ophthalmology;   Laterality: Left;  CDE=11.35  . Hammer toe surgery  11/22/11    [facility], [name]  . Cataract extraction w/phaco  01/28/2012    Procedure: CATARACT EXTRACTION PHACO AND INTRAOCULAR LENS PLACEMENT (IOC);  Surgeon: Susa Simmonds, MD;  Location: [facility];  Service: Ophthalmology;  Laterality: Right;  CDI:8.15  . Cysto with hydrodistension  05/29/2012    Procedure: CYSTOSCOPY/HYDRODISTENSION;  Surgeon: Martina Sinner, MD;  [facility];  Service: Urology;  Laterality: N/A;  INSTILLATION OF MARCAINE AND PYRIDIUM   . Lumbar laminectomy/decompression microdiscectomy Left 06/06/2012    Procedure: LUMBAR LAMINECTOMY/DECOMPRESSION MICRODISCECTOMY 1 LEVEL;  Surgeon: Maeola Harman, MD;  Location: [facility];  Service: Neurosurgery;  Laterality: Left;  Left Lumbar two-three Laminectomy for resection of synovial cyst  . Total hip arthroplasty Right 03/13/2013    Procedure: TOTAL HIP ARTHROPLASTY;  Surgeon: Nestor Lewandowsky, MD;  Location: [facility];  Service: Orthopedics;  Laterality: Right;  . Eye surgery Bilateral     catar  . Abdominal hysterectomy      complete  . Spine surgery      back suregry 2011  . Joint replacement      right hip replacement 2013   Family History  Problem Relation Age of Onset  . Colon cancer Neg Hx   . Anesthesia problems Neg Hx   . Hypotension Neg Hx   . Malignant hyperthermia  Neg Hx   . Pseudochol deficiency Neg Hx   . Diabetes Other   . Allergies Other   . Diabetes Father   . Diabetes Brother   . Cancer Brother 48    colon  . Diabetes Brother   . Diabetes Brother   . Hypertension Sister   . Diabetes Sister   . Diabetes Sister    Social History  Substance Use Topics  . Smoking status: Former Smoker -- 0.10 packs/day for 40 years    Types: Cigarettes  . Smokeless tobacco: Never Used     Comment: smokes 2 cigarettes a day  . Alcohol Use: No   OB History    No data available     Review of Systems  Constitutional: Negative for fever.   Musculoskeletal: Negative for back pain.  Skin: Negative for rash and wound.  Neurological: Negative for numbness.      Allergies  Lisinopril-hydrochlorothiazide; Codeine; and Penicillins  Home Medications   Prior to Admission medications   Medication Sig Start Date End Date Taking? Authorizing Provider  albuterol (PROVENTIL HFA;VENTOLIN HFA) 108 (90 Base) MCG/ACT inhaler Inhale 2 puffs into the lungs every 6 (six) hours as needed for wheezing or shortness of breath. 05/04/15   [name], MD  amitriptyline (ELAVIL) 25 MG tablet Take 1 tablet (25 mg total) by mouth at bedtime. 12/10/14   Mary-Margaret Daphine Deutscher, FNP  aspirin EC 81 MG tablet Take 81 mg by mouth daily.    Historical Provider, MD  Biotin 5000 MCG CAPS Take 1 capsule by mouth daily.    Historical Provider, MD  budesonide-formoterol (SYMBICORT) 160-4.5 MCG/ACT inhaler Inhale 2 puffs into the lungs 2 (two) times daily. 05/04/15   [name], MD  buPROPion (WELLBUTRIN SR) 150 MG 12 hr tablet Take 1 tablet (150 mg total) by mouth 2 (two) times daily. 12/10/14   Mary-Margaret Daphine Deutscher, FNP  calcium citrate-vitamin D (CITRACAL+D) 315-200 MG-UNIT per tablet Take 2 tablets by mouth daily.     Historical Provider, MD  carvedilol (COREG) 6.25 MG tablet Take 1 tablet (6.25 mg total) by mouth 2 (two) times daily with a meal. 08/09/15   Elenora Gamma, MD  cetirizine (ZYRTEC) 10 MG tablet Take 1 tablet (10 mg total) by mouth daily. 06/29/15   Elenora Gamma, MD  citalopram (CELEXA) 10 MG tablet Take 1 tablet (10 mg total) by mouth daily. 06/16/15   [name], MD  esomeprazole (NEXIUM) 40 MG capsule Take 1 capsule (40 mg total) by mouth daily before breakfast. 12/10/14   Mary-Margaret Daphine Deutscher, FNP  fluticasone (FLONASE) 50 MCG/ACT nasal spray Place 2 sprays into both nostrils daily. 02/09/15   Elige Radon Dettinger, MD  furosemide (LASIX) 20 MG tablet Take 0.5 tablets (10 mg total) by mouth daily. Patient taking differently: Take 20 mg  by mouth daily.  12/10/14   Mary-Margaret Daphine Deutscher, FNP  ibuprofen (ADVIL,MOTRIN) 800 MG tablet TAKE  ONE TABLET BY MOUTH EVERY 8 HOURS AS NEEDED 05/24/15   [name], MD  losartan (COZAAR) 50 MG tablet TAKE 1 TABLET (50 MG TOTAL) BY MOUTH DAILY. Patient taking differently: Take 25 mg by mouth daily.  12/10/14   Mary-Margaret Daphine Deutscher, FNP  ondansetron (ZOFRAN) 4 MG tablet TAKE 1 TABLET (4 MG TOTAL) BY MOUTH EVERY 12 (TWELVE) HOURS AS NEEDED FOR NAUSEA. 07/05/15   [name], MD  raloxifene (EVISTA) 60 MG tablet Take 1 tablet (60 mg total) by mouth daily. 12/10/14   Mary-Margaret Daphine Deutscher, FNP  ranitidine (ZANTAC) 150 MG tablet TAKE ONE TABLET BY MOUTH IN THE EVENING 02/10/15   Mary-Margaret Daphine Deutscher, FNP  simvastatin (ZOCOR) 10 MG tablet TAKE 1 TABLET (10 MG TOTAL) BY MOUTH AT BEDTIME. 12/10/14   Mary-Margaret Daphine Deutscher, FNP  Spacer/Aero Chamber Mouthpiece MISC Please dispense one spacer for use with inhaler. 03/14/15   [name], MD  vitamin B-12 (CYANOCOBALAMIN) 1000 MCG tablet Take 0.5 tablets (500 mcg total) by mouth daily. 06/16/15   [name], MD  Vitamin D, Ergocalciferol, (DRISDOL) 50000 UNITS CAPS capsule Take 1 capsule (50,000 Units total) by mouth once a week. Patient taking differently: Take 50,000 Units by mouth every Monday.  12/10/14   Mary-Margaret Daphine Deutscher, FNP   BP 133/55 mmHg  Pulse 61  Temp(Src) 98.1 F (36.7 C) (Oral)  Resp 18  Ht 5\' 4"  (1.626 m)  Wt 70.761 kg  BMI 26.76 kg/m2  SpO2 100% Physical Exam  Constitutional: She is oriented to person, place, and time. She appears well-developed and well-nourished. No distress.  HENT:  Head: Atraumatic.  Eyes: Conjunctivae are normal.  Neck: Neck supple.  Abdominal: Soft. There is no tenderness.  Musculoskeletal: She exhibits tenderness (Right hip: Tenderness noted to the lateral aspect at the greater trochanter on palpation with hip flexion without any deformity. Normal hip flexion extension abduction and abduction. No  gross deformity.).  No spinal midline tenderness on palpation. No tenderness to right knee or right ankle. Dorsalis pedis pulse palpable. Normal skin tone.  Neurological: She is alert and oriented to person, place, and time.  Skin: No rash noted.  Psychiatric: She has a normal mood and affect.  Nursing note and vitals reviewed.   ED Course  Procedures (including critical care time) Labs Review Labs Reviewed - No data to display  Imaging Review Mr Brain Wo Contrast  08/11/2015  CLINICAL DATA:  81 year old female with 6 months of memory loss, cognitive difficulty. No known injury. Initial encounter. EXAM: MRI HEAD WITHOUT CONTRAST TECHNIQUE: Multiplanar, multiecho pulse sequences of the brain and surrounding structures were obtained without intravenous contrast. COMPARISON:  Head CT without contrast 10/04/2013. FINDINGS: There is disproportionate biparietal cerebral volume loss (series 3 image 6 on the right and image 16 on the left. Elsewhere cerebral volume is normal for age. No restricted diffusion to suggest acute infarction. No midline shift, mass effect, evidence of mass lesion, ventriculomegaly, extra-axial collection or acute intracranial hemorrhage. Cervicomedullary junction and pituitary are within normal limits. Major intracranial vascular flow voids are within normal limits. Minimal to mild for age nonspecific cerebral white matter T2 and FLAIR hyperintensity. No cortical encephalomalacia or chronic cerebral blood products. Deep gray matter nuclei, brainstem, and cerebellum are within normal limits. Visible internal auditory structures appear normal. Bilateral temporal lobe structures appear normal for age. Mastoids are clear. Paranasal sinuses are clear. Postoperative changes to the globes. Otherwise negative orbit and scalp soft tissues. Advanced left side C1-C2 degeneration in the visualized upper cervical spine, which otherwise demonstrates age-appropriate degeneration. IMPRESSION: 1.  Disproportionate biparietal cerebral volume loss which can be seen with Alzheimer type dementia. 2. Otherwise unremarkable for age noncontrast MRI appearance of the brain. 3. Advanced left C1-C2 chronic cervical spine degeneration. Electronically Signed   By: 12/04/2013 M.D.   On: 08/11/2015 13:46   Dg Hip Unilat With Pelvis 2-3 Views Right  08/12/2015  CLINICAL DATA:  Right hip pain after feeling a popping sensation when turning over in bed last night. EXAM: DG HIP (WITH OR WITHOUT PELVIS) 2-3V RIGHT COMPARISON:  None. FINDINGS: A right hip bipolar prosthesis is in place. No fracture or dislocation seen. Postsurgical changes in the lower lumbar spine, including fixation hardware and interbody bone plug. IMPRESSION: No fracture or dislocation. Electronically Signed   By: Beckie Salts M.D.   On: 08/12/2015 17:13   I have personally reviewed and evaluated these images and lab results as part of my medical decision-making.   EKG Interpretation None      MDM   Final diagnoses:  Hip strain, right, initial encounter    BP 133/55 mmHg  Pulse 61  Temp(Src) 98.1 F (36.7 C) (Oral)  Resp 18  Ht 5\' 4"  (1.626 m)  Wt 70.761 kg  BMI 26.76 kg/m2  SpO2 100%   5:39 PM Elderly female here with atraumatic right hip pain after trying to turn in bed. X-ray of her right hip and pelvis is unremarkable. Hardware appears to be in place. She is able to ambulate. She is in no acute discomfort. Pain medication given as requested. Pt has hx of GERD, and cardiac disease, therefore i will provide non NSAIDs medication.  Patient will be discharge with pain medication and she will follow-up with her orthopedist next week as scheduled for further care.  Care discussed with Dr. Patria Mane who will also evaluate pt.     Azalia Bilis, MD 08/12/15 1759  Fayrene Helper, PA-C 08/12/15 [id number]  Azalia Bilis, MD 08/13/15 [phone]

## 2015-08-13 MED ORDER — METHOCARBAMOL 500 MG PO TABS: 500.0000 mg | ORAL_TABLET | Freq: Three times a day (TID) | ORAL | Status: DC | PRN

## 2015-08-13 NOTE — ED Notes (Signed)
Ambulate pt with two staff assitance, pt walked with no issues stated " a little light headed or dizzy" when returned to bed checked PO2 pt was 90 and dropped to 88-89 when asked to take deep breaths PO2 increased to 96%.

## 2015-08-13 NOTE — Discharge Instructions (Signed)
Please follow up with your orthopedist for further management of your condition.  You may take vicodin every 6-8 hrs as needed for pain.  You may also take muscle relaxant but be aware that these medications can make you drowsy.  Take it with caution.   Hip Pain Your hip is the joint between your upper legs and your lower pelvis. The bones, cartilage, tendons, and muscles of your hip joint perform a lot of work each day supporting your body weight and allowing you to move around. Hip pain can range from a minor ache to severe pain in one or both of your hips. Pain may be felt on the inside of the hip joint near the groin, or the outside near the buttocks and upper thigh. You may have swelling or stiffness as well.  HOME CARE INSTRUCTIONS   Take medicines only as directed by your health care provider.  Apply ice to the injured area:  Put ice in a plastic bag.  Place a towel between your skin and the bag.  Leave the ice on for 15-20 minutes at a time, 3-4 times a day.  Keep your leg raised (elevated) when possible to lessen swelling.  Avoid activities that cause pain.  Follow specific exercises as directed by your health care provider.  Sleep with a pillow between your legs on your most comfortable side.  Record how often you have hip pain, the location of the pain, and what it feels like. SEEK MEDICAL CARE IF:   You are unable to put weight on your leg.  Your hip is red or swollen or very tender to touch.  Your pain or swelling continues or worsens after 1 week.  You have increasing difficulty walking.  You have a fever. SEEK IMMEDIATE MEDICAL CARE IF:   You have fallen.  You have a sudden increase in pain and swelling in your hip. MAKE SURE YOU:   Understand these instructions.  Will watch your condition.  Will get help right away if you are not doing well or get worse.   This information is not intended to replace advice given to you by your health care provider.  Make sure you discuss any questions you have with your health care provider.   Document Released: 10/04/2009 Document Revised: 05/07/2014 Document Reviewed: 12/11/2012 Elsevier Interactive Patient Education [employer]

## 2015-08-13 NOTE — ED Notes (Signed)
PA Laveda Norman notified at this time of O2 saturation dropping.

## 2015-08-13 NOTE — ED Notes (Signed)
Pt's O2 saturation dropped to 87%, pt repositioned in bed and placed on 2L [place]. Pt is now 97%.

## 2015-08-16 DIAGNOSIS — M7061 Trochanteric bursitis, right hip: Secondary | ICD-10-CM | POA: Diagnosis not present

## 2015-08-22 ENCOUNTER — Other Ambulatory Visit: Payer: Self-pay | Admitting: Pediatrics

## 2015-08-22 ENCOUNTER — Other Ambulatory Visit: Payer: Self-pay | Admitting: Nurse Practitioner

## 2015-08-22 DIAGNOSIS — N644 Mastodynia: Secondary | ICD-10-CM

## 2015-08-23 ENCOUNTER — Encounter (HOSPITAL_COMMUNITY): Payer: Medicare Other

## 2015-08-23 ENCOUNTER — Telehealth: Payer: Self-pay | Admitting: Pediatrics

## 2015-08-23 ENCOUNTER — Ambulatory Visit: Payer: Medicare Other | Attending: Orthopedic Surgery | Admitting: Physical Therapy

## 2015-08-23 DIAGNOSIS — M25551 Pain in right hip: Secondary | ICD-10-CM | POA: Diagnosis not present

## 2015-08-23 DIAGNOSIS — M6281 Muscle weakness (generalized): Secondary | ICD-10-CM | POA: Diagnosis not present

## 2015-08-23 NOTE — Therapy (Signed)
[facility] [address] [place], [place], [address] Phone: [phone]   Fax:  [phone]  Physical Therapy Evaluation  Patient Details  Name: [name] MRN: [id number] Date of Birth: 1935/04/09 Referring Provider: Gean Birchwood MD.  Encounter Date: 08/23/2015      PT End of Session - 08/23/15 1727    Visit Number 1   Number of Visits 12   Date for PT Re-Evaluation 10/18/15   PT Start Time 0148   PT Stop Time 0246   PT Time Calculation (min) 58 min   Activity Tolerance Patient tolerated treatment well   Behavior During Therapy [facility] for tasks assessed/performed      Past Medical History  Diagnosis Date  . Essential hypertension, benign   . Mixed hyperlipidemia   . Depression   . Allergic rhinitis   . GERD (gastroesophageal reflux disease)   . Arthritis   . History of blood transfusion   . Left bundle branch block   . History of cardiac catheterization     Normal coronaries 2010  . Secondary cardiomyopathy (HCC)     LVEF 40-45%, likely nonischemic  . Anxiety   . Osteopenia   . Cataract     had eye surgery    Past Surgical History  Procedure Laterality Date  . Cholecystectomy  1991  . Tonsillectomy    . Appendectomy    . Back surgery  07/21/2008  . Rotator cuff repair  2011    right  . Cataract extraction w/phaco  04/16/2011    Procedure: CATARACT EXTRACTION PHACO AND INTRAOCULAR LENS PLACEMENT (IOC);  Surgeon: Susa Simmonds;  Location: [facility];  Service: Ophthalmology;  Laterality: Left;  CDE=11.35  . Hammer toe surgery  11/22/11    [facility], [name]  . Cataract extraction w/phaco  01/28/2012    Procedure: CATARACT EXTRACTION PHACO AND INTRAOCULAR LENS PLACEMENT (IOC);  Surgeon: Susa Simmonds, MD;  Location: [facility];  Service: Ophthalmology;  Laterality: Right;  CDI:8.15  . Cysto with hydrodistension  05/29/2012    Procedure: CYSTOSCOPY/HYDRODISTENSION;  Surgeon: Martina Sinner, MD;  Location: [facility];  Service: Urology;  Laterality: N/A;  INSTILLATION OF MARCAINE AND PYRIDIUM   . Lumbar laminectomy/decompression microdiscectomy Left 06/06/2012    Procedure: LUMBAR LAMINECTOMY/DECOMPRESSION MICRODISCECTOMY 1 LEVEL;  Surgeon: Maeola Harman, MD;  Location: [facility];  Service: Neurosurgery;  Laterality: Left;  Left Lumbar two-three Laminectomy for resection of synovial cyst  . Total hip arthroplasty Right 03/13/2013    Procedure: TOTAL HIP ARTHROPLASTY;  Surgeon: Nestor Lewandowsky, MD;  Location: [facility];  Service: Orthopedics;  Laterality: Right;  . Eye surgery Bilateral     catar  . Abdominal hysterectomy      complete  . Spine surgery      back suregry 2011  . Joint replacement      right hip replacement 2013    There were no vitals filed for this visit.       Subjective Assessment - 08/23/15 1357    Subjective Right hip pain increases when I'm up a lot.   Pertinent History Right total hip replacement.   Limitations Walking   How long can you walk comfortably? Short distances.   Patient Stated Goals Get rid of my right hip pain.   Pain Score 5    Pain Location Hip   Pain Orientation Right   Pain Descriptors / Indicators Aching;Throbbing   Pain Type Acute pain   Pain Onset More than  a month ago   Pain Frequency Constant   Aggravating Factors  Walking a lot and lying on right side.   Pain Relieving Factors Rest.   Effect of Pain on Daily Activities Have to take rest breaks.            [facility] PT Assessment - 08/23/15 0001    Assessment   Medical Diagnosis Right hip bursitis.   Referring Provider Gean Birchwood MD.   Onset Date/Surgical Date --  2 months.   Precautions   Precaution Comments RT THA/NO ULTRASOUND.   Restrictions   Weight Bearing Restrictions No   Balance Screen   Has the patient fallen in the past 6 months No   Has the patient had a decrease in activity level because of a fear of falling?  No   Is the patient reluctant to leave their home  because of a fear of falling?  No   Home Environment   Living Environment Private residence   Home Access Level entry   Prior Function   Level of Independence Independent   Vocation --  Caregiver for elderly.   ROM / Strength   AROM / PROM / Strength AROM   AROM   Overall AROM Comments Functional right hip motion mindful of precautions.   Strength   Overall Strength Comments Right hip abduction 4 to 4+/5 when contralaterally compared.   Special Tests    Special Tests --  LE DTR's are intact.   Transfers   Transfers Supine to Sit   Supine to Sit 6: Modified independent (Device/Increase time)   Ambulation/Gait   Gait Pattern Decreased stance time - right;Antalgic                   [facility] Adult PT Treatment/Exercise - 08/23/15 0001    Modalities   Modalities Electrical Stimulation;Iontophoresis   Moist Heat Therapy   Number Minutes Moist Heat 20 Minutes   Moist Heat Location --  RT HIP.   Programme researcher, broadcasting/film/video Location --  RT HIP.   Electrical Stimulation Action Constant Pre-mod e'stim at 80-150 Hz x 20 minutes.   Iontophoresis   Type of Iontophoresis Dexamethasone   Location RT HIP.   Dose 4mg /ml   Time --  Patch.                  PT Short Term Goals - 08/23/15 1739    PT SHORT TERM GOAL #1   Title Ind with a HEP.   Time 3   Period Weeks   Status New           PT Long Term Goals - 08/23/15 1739    PT LONG TERM GOAL #1   Title Perform ADL's with pain not > 3/10.   Time 6   Period Weeks   Status New   PT LONG TERM GOAL #2   Title Stand 20 minutes with pain not > 3/10.   Time 6   Period Weeks   Status New   PT LONG TERM GOAL #3   Title Walk a community distance with pain not > 3/10.               Plan - 08/23/15 1735    Clinical Impression Statement The patient reports increasing right hip pain over the last 2 months.  A recent injection has been helpful with reducing her pain however.  Her  resting pain-level is a 5/10 but her pain rises to a 8/10 with  increased standing and walking.  She is also unable to sleep on her right side due to pain.     Rehab Potential Excellent   PT Frequency 2x / week   PT Duration 6 weeks   PT Treatment/Interventions ADLs/Self Care Home Management;Electrical Stimulation;Iontophoresis 4mg /ml Dexamethasone;Moist Heat;Therapeutic exercise;Therapeutic activities;Patient/family education;Manual techniques   PT Next Visit Plan Right hip abduction strengthening.  STW/M; HMP and electrical stimulation.  Iontophoresis.   Consulted and Agree with Plan of Care Patient      Patient will benefit from skilled therapeutic intervention in order to improve the following deficits and impairments:  Pain, Decreased activity tolerance, Decreased strength  Visit Diagnosis: Muscle weakness (generalized) - Plan: PT plan of care cert/re-cert  Pain in right hip - Plan: PT plan of care cert/re-cert      G-Codes - Aug 29, 2015 1728    Functional Assessment Tool Used Clinical judgement....   Functional Limitation Mobility: Walking and moving around   Mobility: Walking and Moving Around Current Status [phone]) At least 20 percent but less than 40 percent impaired, limited or restricted   Mobility: Walking and Moving Around Goal Status [phone]) At least 1 percent but less than 20 percent impaired, limited or restricted       Problem List Patient Active Problem List   Diagnosis Date Noted  . Memory problem 05/07/2015  . COPD (chronic obstructive pulmonary disease) (HCC) 04/19/2015  . Neck pain 04/06/2015  . Insomnia 12/10/2014  . BMI 28.0-28.9,adult 12/10/2014  . Osteopenia 05/19/2014  . GAD (generalized anxiety disorder) 01/06/2014  . Depression 01/06/2014  . GERD (gastroesophageal reflux disease) 01/06/2014  . Vitamin D deficiency 01/06/2014  . Overactive bladder 01/06/2014  . Secondary cardiomyopathy (HCC) 10/06/2013  . Left bundle branch block 10/06/2013  .  Dizziness 10/04/2013  . Osteoarthritis of right hip 03/14/2013  . Allergic rhinitis 12/07/2008  . Chronic cough 12/07/2008  . Hepatitis B virus infection 08/16/2008  . Hyperlipidemia 08/16/2008  . Essential hypertension 08/16/2008  . Osteoarthritis 08/16/2008  . DISC DEGENERATION 07/24/2007    Davanta Meuser, 07/26/2007 MPT 2015-08-29, 5:44 PM  [facility] [address] [place], [place], [place] Phone: [phone]   Fax:  [phone]  Name: [name] MRN: [name] Date of Birth: 1935/03/06

## 2015-08-23 NOTE — Telephone Encounter (Signed)
Spoke with daughter; Per Epic notes, mother cancelled her appointmentt and rescheduled for next Tuesday

## 2015-08-23 NOTE — Therapy (Signed)
[facility] [place], [place], [address] Phone: [phone]   Fax:  [phone]  Physical Therapy Treatment  Patient Details  Name: NERIAH BROTT MRN: [id number] Date of Birth: Sep 10, 1934 Referring Provider: Assunta Found, MD  Encounter Date: 06/20/2015    Past Medical History  Diagnosis Date  . Essential hypertension, benign   . Mixed hyperlipidemia   . Depression   . Allergic rhinitis   . GERD (gastroesophageal reflux disease)   . Arthritis   . History of blood transfusion   . Left bundle branch block   . History of cardiac catheterization     Normal coronaries 2010  . Secondary cardiomyopathy (HCC)     LVEF 40-45%, likely nonischemic  . Anxiety   . Osteopenia   . Cataract     had eye surgery    Past Surgical History  Procedure Laterality Date  . Cholecystectomy  1991  . Tonsillectomy    . Appendectomy    . Back surgery  07/21/2008  . Rotator cuff repair  2011    right  . Cataract extraction w/phaco  04/16/2011    Procedure: CATARACT EXTRACTION PHACO AND INTRAOCULAR LENS PLACEMENT (IOC);  Surgeon: Williams Che;  Location: [facility];  Service: Ophthalmology;  Laterality: Left;  CDE=11.35  . Hammer toe surgery  11/22/11    [place], [place]  . Cataract extraction w/phaco  01/28/2012    Procedure: CATARACT EXTRACTION PHACO AND INTRAOCULAR LENS PLACEMENT (IOC);  Surgeon: Williams Che, MD;  Location: [facility];  Service: Ophthalmology;  Laterality: Right;  CDI:8.15  . Cysto with hydrodistension  05/29/2012    Procedure: CYSTOSCOPY/HYDRODISTENSION;  Surgeon: Reece Packer, MD;  Location[facility];  Service: Urology;  Laterality: N/A;  INSTILLATION OF MARCAINE AND PYRIDIUM   . Lumbar laminectomy/decompression microdiscectomy Left 06/06/2012    Procedure: LUMBAR LAMINECTOMY/DECOMPRESSION MICRODISCECTOMY 1 LEVEL;  Surgeon: Erline Levine, MD;  Location: [place] [facility];  Service: Neurosurgery;  Laterality:  Left;  Left Lumbar two-three Laminectomy for resection of synovial cyst  . Total hip arthroplasty Right 03/13/2013    Procedure: TOTAL HIP ARTHROPLASTY;  Surgeon: Kerin Salen, MD;  Location: [facility];  Service: Orthopedics;  Laterality: Right;  . Eye surgery Bilateral     catar  . Abdominal hysterectomy      complete  . Spine surgery      back suregry 2011  . Joint replacement      right hip replacement 2013    There were no vitals filed for this visit.                                    PT Long Term Goals - 05/31/15 1606    PT LONG TERM GOAL #1   Title Improved left cervical rotation to 65 degrees or more to improve ADLS   Time 6   Period Weeks   Status On-going  L cervical rotation AROM 46 deg 05/31/2015   PT LONG TERM GOAL #2   Title decreased pain in neck to 4/10 or better with motion.   Time 6   Period Weeks   Status On-going             Patient will benefit from skilled therapeutic intervention in order to improve the following deficits and impairments:  Decreased range of motion, Pain, Decreased activity tolerance, Impaired flexibility, Decreased strength  Visit Diagnosis: Neck pain  Stiffness of multiple joints     Problem List Patient Active Problem List   Diagnosis Date Noted  . Memory problem 05/07/2015  . COPD (chronic obstructive pulmonary disease) ([place]) 04/19/2015  . Neck pain 04/06/2015  . Insomnia 12/10/2014  . BMI 28.0-28.9,adult 12/10/2014  . Osteopenia 05/19/2014  . GAD (generalized anxiety disorder) 01/06/2014  . Depression 01/06/2014  . GERD (gastroesophageal reflux disease) 01/06/2014  . Vitamin D deficiency 01/06/2014  . Overactive bladder 01/06/2014  . Secondary cardiomyopathy ([place]) 10/06/2013  . Left bundle branch block 10/06/2013  . Dizziness 10/04/2013  . Osteoarthritis of right hip 03/14/2013  . Allergic rhinitis 12/07/2008  . Chronic cough 12/07/2008  . Hepatitis B virus infection 08/16/2008   . Hyperlipidemia 08/16/2008  . Essential hypertension 08/16/2008  . Osteoarthritis 08/16/2008  . Dexter DEGENERATION 07/24/2007   PHYSICAL THERAPY DISCHARGE SUMMARY  Visits from Start of Care: 10.  Current functional level related to goals / functional outcomes: Please see above.   Remaining deficits: Continued neck pain and loss of range of motion.   Education / Equipment: HEP.  Plan: Patient agrees to discharge.  Patient goals were not met. Patient is being discharged due to not returning since the last visit.  ?????      Felisha Claytor, [place] MPT 08/23/2015, 1:41 PM  [facility] [address] [place], [place], [address] Phone: [phone]   Fax:  [phone]  Name: MARIEM SKOLNICK MRN: [id number] Date of Birth: [birth year]

## 2015-08-30 ENCOUNTER — Ambulatory Visit (HOSPITAL_COMMUNITY)
Admission: RE | Admit: 2015-08-30 | Discharge: 2015-08-30 | Disposition: A | Discharge status: Home / Self Care | Payer: Medicare Other | Source: Ambulatory Visit | Attending: Nurse Practitioner | Admitting: Nurse Practitioner

## 2015-08-30 ENCOUNTER — Encounter: Payer: Self-pay | Admitting: Physical Therapy

## 2015-08-30 ENCOUNTER — Ambulatory Visit: Payer: Medicare Other | Attending: Orthopedic Surgery | Admitting: Physical Therapy

## 2015-08-30 ENCOUNTER — Encounter (HOSPITAL_COMMUNITY): Payer: Medicare Other

## 2015-08-30 DIAGNOSIS — N644 Mastodynia: Secondary | ICD-10-CM | POA: Diagnosis not present

## 2015-08-30 DIAGNOSIS — M6281 Muscle weakness (generalized): Secondary | ICD-10-CM | POA: Insufficient documentation

## 2015-08-30 DIAGNOSIS — M25551 Pain in right hip: Secondary | ICD-10-CM | POA: Diagnosis not present

## 2015-08-30 NOTE — Patient Instructions (Signed)
Strengthening: Hip Adduction - Isometric    With ball or folded pillow between knees, squeeze knees together. Hold _5___ seconds. Repeat _10___ times per set. Do __2__ sets per session. Do __2-3__ sessions per day.  http://orth.exer.us/612   Copyright  VHI. All rights reserved.  Strengthening: Straight Leg Raise (Phase 1)    Tighten muscles on front of right thigh, then lift leg __5__ inches from surface, keeping knee locked.  Repeat __10__ times per set. Do __2__ sets per session. Do _2-3___ sessions per day.  http://orth.exer.us/614   Copyright  VHI. All rights reserved.  Strengthening: Hip Abduction (Side-Lying)    Tighten muscles on front of right thigh, then lift leg _5___ inches from surface, keeping knee locked.  Repeat _10___ times per set. Do _2___ sets per session. Do _2-3___ sessions per day.  http://orth.exer.us/622   Copyright  VHI. All rights reserved.

## 2015-08-30 NOTE — Therapy (Signed)
[facility] [address] [place], [place], [address] Phone: [phone]   Fax:  [phone]  Physical Therapy Treatment  Patient Details  Name: LILLIAHNA SCHUBRING MRN: [id number] Date of Birth: 11/14/1934 Referring Provider: Gean Birchwood MD.  Encounter Date: 08/30/2015      PT End of Session - 08/30/15 1547    Visit Number 2   Number of Visits 12   Date for PT Re-Evaluation 10/18/15   PT Start Time 1554   PT Stop Time 1650   PT Time Calculation (min) 56 min   Activity Tolerance Patient tolerated treatment well   Behavior During Therapy [facility] for tasks assessed/performed      Past Medical History  Diagnosis Date  . Essential hypertension, benign   . Mixed hyperlipidemia   . Depression   . Allergic rhinitis   . GERD (gastroesophageal reflux disease)   . Arthritis   . History of blood transfusion   . Left bundle branch block   . History of cardiac catheterization     Normal coronaries 2010  . Secondary cardiomyopathy (HCC)     LVEF 40-45%, likely nonischemic  . Anxiety   . Osteopenia   . Cataract     had eye surgery    Past Surgical History  Procedure Laterality Date  . Cholecystectomy  1991  . Tonsillectomy    . Appendectomy    . Back surgery  07/21/2008  . Rotator cuff repair  2011    right  . Cataract extraction w/phaco  04/16/2011    Procedure: CATARACT EXTRACTION PHACO AND INTRAOCULAR LENS PLACEMENT (IOC);  Surgeon: Susa Simmonds;  [facility]: [facility];  Service: Ophthalmology;  Laterality: Left;  CDE=11.35  . Hammer toe surgery  11/22/11    [facility], [name]  . Cataract extraction w/phaco  01/28/2012    Procedure: CATARACT EXTRACTION PHACO AND INTRAOCULAR LENS PLACEMENT (IOC);  Surgeon: Susa Simmonds, MD;  Location: [facility];  Service: Ophthalmology;  Laterality: Right;  CDI:8.15  . Cysto with hydrodistension  05/29/2012    Procedure: CYSTOSCOPY/HYDRODISTENSION;  Surgeon: Martina Sinner, MD;  Location: [facility];  Service: Urology;  Laterality: N/A;  INSTILLATION OF MARCAINE AND PYRIDIUM   . Lumbar laminectomy/decompression microdiscectomy Left 06/06/2012    Procedure: LUMBAR LAMINECTOMY/DECOMPRESSION MICRODISCECTOMY 1 LEVEL;  Surgeon: Maeola Harman, MD;  [facility];  Service: Neurosurgery;  Laterality: Left;  Left Lumbar two-three Laminectomy for resection of synovial cyst  . Total hip arthroplasty Right 03/13/2013    Procedure: TOTAL HIP ARTHROPLASTY;  Surgeon: Nestor Lewandowsky, MD;  Location: [facility];  Service: Orthopedics;  Laterality: Right;  . Eye surgery Bilateral     catar  . Abdominal hysterectomy      complete  . Spine surgery      back suregry 2011  . Joint replacement      right hip replacement 2013    There were no vitals filed for this visit.      Subjective Assessment - 08/30/15 1547    Subjective Thinks that iontophoresis patch really helped her hip. Reports she is a little tired today.   Pertinent History Right total hip replacement.   Limitations Walking   How long can you walk comfortably? Short distances.   Patient Stated Goals Get rid of my right hip pain.   Currently in Pain? Yes   Pain Score 1    Pain Location Hip   Pain Orientation Right   Pain Descriptors / Indicators Sharp;Throbbing  Pain Type Acute pain   Pain Onset More than a month ago            [facility] PT Assessment - 08/30/15 0001    Assessment   Medical Diagnosis Right hip bursitis.   Next MD Visit 08/2015 if needed   Precautions   Precaution Comments RT THA/NO ULTRASOUND.   Restrictions   Weight Bearing Restrictions No                     [facility] Adult PT Treatment/Exercise - 08/30/15 0001    Exercises   Exercises Knee/Hip   Knee/Hip Exercises: Stretches   Active Hamstring Stretch Right;3 reps;30 seconds   ITB Stretch Right;3 reps;30 seconds   Knee/Hip Exercises: Aerobic   Nustep L3 x10 min    Knee/Hip Exercises: Supine   Short Arc Quad Sets  Strengthening;Right;2 sets;10 reps   Short Arc Quad Sets Limitations 3#   Hip Adduction Isometric Strengthening;Both;2 sets;10 reps   Bridges Strengthening;Both;2 sets;10 reps  Experienced LBP at initial rep but did not experience again   Straight Leg Raises Strengthening;Right;2 sets;10 reps   Knee/Hip Exercises: Sidelying   Hip ABduction Strengthening;Right;2 sets;10 reps   Knee/Hip Exercises: Prone   Hamstring Curl 2 sets;10 reps   Hamstring Curl Limitations 3#   Hip Extension Strengthening;Right;1 set;10 reps   Modalities   Modalities Electrical Stimulation;Moist Heat;Iontophoresis   Moist Heat Therapy   Number Minutes Moist Heat 15 Minutes   Moist Heat Location Lumbar Spine;Hip   Electrical Stimulation   Electrical Stimulation Location R hip along incision   Electrical Stimulation Action Pre-Mod   Electrical Stimulation Parameters 80-150 hz x15 min   Electrical Stimulation Goals Pain   Iontophoresis   Type of Iontophoresis Dexamethasone   Location R hip   Dose 1 ml   Time 8                PT Education - 08/30/15 1641    Education provided Yes   Education Details HEP- SLR, ball/pillow squeeze, hip abduction   Person(s) Educated Patient   Methods Explanation;Tactile cues;Verbal cues;Handout   Comprehension Verbalized understanding;Verbal cues required;Tactile cues required          PT Short Term Goals - 08/23/15 1739    PT SHORT TERM GOAL #1   Title Ind with a HEP.   Time 3   Period Weeks   Status New           PT Long Term Goals - 08/23/15 1739    PT LONG TERM GOAL #1   Title Perform ADL's with pain not > 3/10.   Time 6   Period Weeks   Status New   PT LONG TERM GOAL #2   Title Stand 20 minutes with pain not > 3/10.   Time 6   Period Weeks   Status New   PT LONG TERM GOAL #3   Title Walk a community distance with pain not > 3/10.               Plan - 08/30/15 1634    Clinical Impression Statement Patient tolerated today's  treatment fairly well as she was fatigued upon arrival but had low R hip pain. Completed NuStep at workload 3 fairly well although she required short rest breaks due to fatigue. Completed all other therapeutic exercises well with no pain reported other than iniital bridge rep in which she reported low back pain. R hip abudctor weakness noted with L sidelying R hip  abduction as flexion was noted and hips rotated posteriorly secondary to weakness. Normal modalites response noted following removal of the modalities. Iontophoresis patch was placed over the R hip again following benefits reported by patient. Patient reported "barely feeling" R hip upon end of treatment.   Rehab Potential Excellent   PT Frequency 2x / week   PT Duration 6 weeks   PT Treatment/Interventions ADLs/Self Care Home Management;Electrical Stimulation;Iontophoresis 4mg /ml Dexamethasone;Moist Heat;Therapeutic exercise;Therapeutic activities;Patient/family education;Manual techniques   PT Next Visit Plan Continue R hip strengthening with modalities PRN for pain and iontophoresis per MPT POC.   PT Home Exercise Plan HEP- SLR, ball/pillow squeeze, hip abduction   Consulted and Agree with Plan of Care Patient      Patient will benefit from skilled therapeutic intervention in order to improve the following deficits and impairments:  Pain, Decreased activity tolerance, Decreased strength  Visit Diagnosis: Muscle weakness (generalized)  Pain in right hip     Problem List Patient Active Problem List   Diagnosis Date Noted  . Memory problem 05/07/2015  . COPD (chronic obstructive pulmonary disease) (HCC) 04/19/2015  . Neck pain 04/06/2015  . Insomnia 12/10/2014  . BMI 28.0-28.9,adult 12/10/2014  . Osteopenia 05/19/2014  . GAD (generalized anxiety disorder) 01/06/2014  . Depression 01/06/2014  . GERD (gastroesophageal reflux disease) 01/06/2014  . Vitamin D deficiency 01/06/2014  . Overactive bladder 01/06/2014  . Secondary  cardiomyopathy (HCC) 10/06/2013  . Left bundle branch block 10/06/2013  . Dizziness 10/04/2013  . Osteoarthritis of right hip 03/14/2013  . Allergic rhinitis 12/07/2008  . Chronic cough 12/07/2008  . Hepatitis B virus infection 08/16/2008  . Hyperlipidemia 08/16/2008  . Essential hypertension 08/16/2008  . Osteoarthritis 08/16/2008  . DISC DEGENERATION 07/24/2007    07/26/2007, PTA 08/30/2015, 5:22 PM  [facility] [address] [place], [place], [place] Phone: [phone]   Fax:  [phone]  Name: AIRELLE EVERDING MRN: [name] Date of Birth: 1935-02-14

## 2015-09-01 ENCOUNTER — Encounter: Payer: Self-pay | Admitting: Physical Therapy

## 2015-09-01 ENCOUNTER — Ambulatory Visit: Payer: Medicare Other | Admitting: Physical Therapy

## 2015-09-01 DIAGNOSIS — M6281 Muscle weakness (generalized): Secondary | ICD-10-CM

## 2015-09-01 DIAGNOSIS — M25551 Pain in right hip: Secondary | ICD-10-CM

## 2015-09-01 NOTE — Therapy (Signed)
[facility] [address] [place], [place], [address] Phone: [phone]   Fax:  [phone]  Physical Therapy Treatment  Patient Details  Name: [name] MRN: [id number] Date of Birth: [birth year] Referring Provider: Gean Birchwood MD.  Encounter Date: 09/01/2015      PT End of Session - 09/01/15 1605    Visit Number 3   Number of Visits 12   Date for PT Re-Evaluation 10/18/15   PT Start Time 1603   PT Stop Time 1650   PT Time Calculation (min) 47 min   Activity Tolerance Patient tolerated treatment well   Behavior During Therapy [facility] for tasks assessed/performed      Past Medical History  Diagnosis Date  . Essential hypertension, benign   . Mixed hyperlipidemia   . Depression   . Allergic rhinitis   . GERD (gastroesophageal reflux disease)   . Arthritis   . History of blood transfusion   . Left bundle branch block   . History of cardiac catheterization     Normal coronaries 2010  . Secondary cardiomyopathy (HCC)     LVEF 40-45%, likely nonischemic  . Anxiety   . Osteopenia   . Cataract     had eye surgery    Past Surgical History  Procedure Laterality Date  . Cholecystectomy  1991  . Tonsillectomy    . Appendectomy    . Back surgery  07/21/2008  . Rotator cuff repair  2011    right  . Cataract extraction w/phaco  04/16/2011    Procedure: CATARACT EXTRACTION PHACO AND INTRAOCULAR LENS PLACEMENT (IOC);  Surgeon: Susa Simmonds;  Location: [facility];  Service: Ophthalmology;  Laterality: Left;  CDE=11.35  . Hammer toe surgery  11/22/11    [facility], [name]  . Cataract extraction w/phaco  01/28/2012    Procedure: CATARACT EXTRACTION PHACO AND INTRAOCULAR LENS PLACEMENT (IOC);  Surgeon: Susa Simmonds, MD;  Location: [facility];  Service: Ophthalmology;  Laterality: Right;  CDI:8.15  . Cysto with hydrodistension  05/29/2012    Procedure: CYSTOSCOPY/HYDRODISTENSION;  Surgeon: Martina Sinner, MD;  Location: [facility];  Service: Urology;  Laterality: N/A;  INSTILLATION OF MARCAINE AND PYRIDIUM   . Lumbar laminectomy/decompression microdiscectomy Left 06/06/2012    Procedure: LUMBAR LAMINECTOMY/DECOMPRESSION MICRODISCECTOMY 1 LEVEL;  Surgeon: Maeola Harman, MD;  [facility];  Service: Neurosurgery;  Laterality: Left;  Left Lumbar two-three Laminectomy for resection of synovial cyst  . Total hip arthroplasty Right 03/13/2013    Procedure: TOTAL HIP ARTHROPLASTY;  Surgeon: Nestor Lewandowsky, MD;  Location: [facility];  Service: Orthopedics;  Laterality: Right;  . Eye surgery Bilateral     catar  . Abdominal hysterectomy      complete  . Spine surgery      back suregry 2011  . Joint replacement      right hip replacement 2013    There were no vitals filed for this visit.      Subjective Assessment - 09/01/15 1604    Subjective States that hip is some better today and states that she thinks the patch really helps.   Pertinent History Right total hip replacement.   Limitations Walking   How long can you walk comfortably? Short distances.   Patient Stated Goals Get rid of my right hip pain.   Currently in Pain? Yes   Pain Score 3    Pain Location Hip   Pain Orientation Right   Pain Descriptors / Indicators Throbbing  Pain Type Acute pain   Pain Onset More than a month ago   Pain Frequency Intermittent            [facility] PT Assessment - 09/01/15 0001    Assessment   Medical Diagnosis Right hip bursitis.   Next MD Visit 08/2015 if needed   Precautions   Precaution Comments RT THA/NO ULTRASOUND.   Restrictions   Weight Bearing Restrictions No                     [facility] Adult PT Treatment/Exercise - 09/01/15 0001    Knee/Hip Exercises: Aerobic   Nustep L4 x10 min   Knee/Hip Exercises: Seated   Long Arc Quad Strengthening;Right;3 sets;10 reps;Weights   Long Arc Quad Weight 3 lbs.   Knee/Hip Exercises: Supine   Hip Adduction Isometric Strengthening;Both;2 sets;10  reps  5 sec hold   Bridges Strengthening;Both;2 sets;10 reps  No LBP; favored LLE more than RLE and required VCs    Straight Leg Raises Strengthening;Right;2 sets;10 reps   Knee/Hip Exercises: Sidelying   Hip ABduction Strengthening;Right;2 sets;10 reps   Knee/Hip Exercises: Prone   Hamstring Curl 3 sets;10 reps   Hamstring Curl Limitations 3#   Hip Extension Strengthening;Right;2 sets;10 reps   Modalities   Modalities Electrical Stimulation;Moist Heat;Iontophoresis   Moist Heat Therapy   Number Minutes Moist Heat 15 Minutes   Moist Heat Location Lumbar Spine;Hip   Electrical Stimulation   Electrical Stimulation Location R hip along incision   Electrical Stimulation Action Pre-Mod   Electrical Stimulation Parameters 80-150 Hz x15 min   Electrical Stimulation Goals Pain   Iontophoresis   Type of Iontophoresis Dexamethasone   Location R hip   Dose 1 ml   Time 8                  PT Short Term Goals - 08/23/15 1739    PT SHORT TERM GOAL #1   Title Ind with a HEP.   Time 3   Period Weeks   Status New           PT Long Term Goals - 08/23/15 1739    PT LONG TERM GOAL #1   Title Perform ADL's with pain not > 3/10.   Time 6   Period Weeks   Status New   PT LONG TERM GOAL #2   Title Stand 20 minutes with pain not > 3/10.   Time 6   Period Weeks   Status New   PT LONG TERM GOAL #3   Title Walk a community distance with pain not > 3/10.               Plan - 09/01/15 1634    Clinical Impression Statement Patient tolerated today's treatment well and did not fatigue as much with today's therapeutic exercises. Patient did not report any R hip or low back pain with any of the exercises. Patient able to tolerate all exercises well and able to tolerate increased repitions with certain exercises. Patient demonstrated a greater dependence on LLE with bridges and required VCs to equal the weightbearing. Patient continues to demonstrate weakness with R hip  extension in prone and hip abduction in sidelying. Patient required VCs to maintain proper hip alignment for hip abduction in sidelying. Normal modalites response noted following removal of the modalites. Patient denied pain following today's treatment.   Rehab Potential Excellent   PT Frequency 2x / week   PT Duration 6 weeks   PT  Treatment/Interventions ADLs/Self Care Home Management;Electrical Stimulation;Iontophoresis 4mg /ml Dexamethasone;Moist Heat;Therapeutic exercise;Therapeutic activities;Patient/family education;Manual techniques   PT Next Visit Plan Continue R hip strengthening with modalities PRN for pain and iontophoresis per MPT POC.   PT Home Exercise Plan HEP- SLR, ball/pillow squeeze, hip abduction   Consulted and Agree with Plan of Care Patient      Patient will benefit from skilled therapeutic intervention in order to improve the following deficits and impairments:  Pain, Decreased activity tolerance, Decreased strength  Visit Diagnosis: Muscle weakness (generalized)  Pain in right hip     Problem List Patient Active Problem List   Diagnosis Date Noted  . Memory problem 05/07/2015  . COPD (chronic obstructive pulmonary disease) (HCC) 04/19/2015  . Neck pain 04/06/2015  . Insomnia 12/10/2014  . BMI 28.0-28.9,adult 12/10/2014  . Osteopenia 05/19/2014  . GAD (generalized anxiety disorder) 01/06/2014  . Depression 01/06/2014  . GERD (gastroesophageal reflux disease) 01/06/2014  . Vitamin D deficiency 01/06/2014  . Overactive bladder 01/06/2014  . Secondary cardiomyopathy (HCC) 10/06/2013  . Left bundle branch block 10/06/2013  . Dizziness 10/04/2013  . Osteoarthritis of right hip 03/14/2013  . Allergic rhinitis 12/07/2008  . Chronic cough 12/07/2008  . Hepatitis B virus infection 08/16/2008  . Hyperlipidemia 08/16/2008  . Essential hypertension 08/16/2008  . Osteoarthritis 08/16/2008  . DISC DEGENERATION 07/24/2007    07/26/2007, PTA 09/01/2015, 4:53  PM  [facility] [address] [place], [place], [place] Phone: [phone]   Fax:  [phone]  Name: [name] MRN: [name] Date of Birth: [birth year]

## 2015-09-06 ENCOUNTER — Encounter: Payer: Self-pay | Admitting: Physical Therapy

## 2015-09-06 ENCOUNTER — Ambulatory Visit: Payer: Medicare Other | Admitting: Physical Therapy

## 2015-09-06 DIAGNOSIS — M6281 Muscle weakness (generalized): Secondary | ICD-10-CM | POA: Diagnosis not present

## 2015-09-06 DIAGNOSIS — M25551 Pain in right hip: Secondary | ICD-10-CM

## 2015-09-06 NOTE — Therapy (Signed)
[facility] [address] [place], [place], [address] Phone: [phone]   Fax:  [phone]  Physical Therapy Treatment  Patient Details  Name: [name] MRN: [id number] Date of Birth: 08-03-1934 Referring Provider: Gean Birchwood MD.  Encounter Date: 09/06/2015      PT End of Session - 09/06/15 1602    Visit Number 4   Number of Visits 12   Date for PT Re-Evaluation 10/18/15   PT Start Time 1600   PT Stop Time 1647   PT Time Calculation (min) 47 min   Activity Tolerance Patient tolerated treatment well   Behavior During Therapy [facility] for tasks assessed/performed      Past Medical History  Diagnosis Date  . Essential hypertension, benign   . Mixed hyperlipidemia   . Depression   . Allergic rhinitis   . GERD (gastroesophageal reflux disease)   . Arthritis   . History of blood transfusion   . Left bundle branch block   . History of cardiac catheterization     Normal coronaries 2010  . Secondary cardiomyopathy (HCC)     LVEF 40-45%, likely nonischemic  . Anxiety   . Osteopenia   . Cataract     had eye surgery    Past Surgical History  Procedure Laterality Date  . Cholecystectomy  1991  . Tonsillectomy    . Appendectomy    . Back surgery  07/21/2008  . Rotator cuff repair  2011    right  . Cataract extraction w/phaco  04/16/2011    Procedure: CATARACT EXTRACTION PHACO AND INTRAOCULAR LENS PLACEMENT (IOC);  Surgeon: Susa Simmonds;  Location: [facility];  Service: Ophthalmology;  Laterality: Left;  CDE=11.35  . Hammer toe surgery  11/22/11    [facility], [name]  . Cataract extraction w/phaco  01/28/2012    Procedure: CATARACT EXTRACTION PHACO AND INTRAOCULAR LENS PLACEMENT (IOC);  Surgeon: Susa Simmonds, MD;  Location: [facility];  Service: Ophthalmology;  Laterality: Right;  CDI:8.15  . Cysto with hydrodistension  05/29/2012    Procedure: CYSTOSCOPY/HYDRODISTENSION;  Surgeon: Martina Sinner, MD;  Location: [facility];  Service: Urology;  Laterality: N/A;  INSTILLATION OF MARCAINE AND PYRIDIUM   . Lumbar laminectomy/decompression microdiscectomy Left 06/06/2012    Procedure: LUMBAR LAMINECTOMY/DECOMPRESSION MICRODISCECTOMY 1 LEVEL;  Surgeon: Maeola Harman, MD;  [facility];  Service: Neurosurgery;  Laterality: Left;  Left Lumbar two-three Laminectomy for resection of synovial cyst  . Total hip arthroplasty Right 03/13/2013    Procedure: TOTAL HIP ARTHROPLASTY;  Surgeon: Nestor Lewandowsky, MD;  Location: [facility];  Service: Orthopedics;  Laterality: Right;  . Eye surgery Bilateral     catar  . Abdominal hysterectomy      complete  . Spine surgery      back suregry 2011  . Joint replacement      right hip replacement 2013    There were no vitals filed for this visit.      Subjective Assessment - 09/06/15 1602    Subjective States that she has been working "pretty hard" today and she has 2/10 pain in R hip that she can "feel" when she moves it.   Pertinent History Right total hip replacement.   Limitations Walking   How long can you walk comfortably? Short distances.   Patient Stated Goals Get rid of my right hip pain.   Currently in Pain? Yes   Pain Score 2    Pain Location Hip   Pain  Orientation Right   Pain Descriptors / Indicators Discomfort   Pain Type Acute pain   Pain Onset More than a month ago            [facility] PT Assessment - 09/06/15 0001    Assessment   Medical Diagnosis Right hip bursitis.   Next MD Visit 08/2015 if needed   Precautions   Precaution Comments RT THA/NO ULTRASOUND.   Restrictions   Weight Bearing Restrictions No                     [facility] Adult PT Treatment/Exercise - 09/06/15 0001    Knee/Hip Exercises: Aerobic   Nustep L5 x12 min   Knee/Hip Exercises: Standing   Hip Flexion Stengthening;Right;2 sets;10 reps;Knee bent  3#   Hip Abduction Stengthening;Right;2 sets;10 reps;Knee straight  3#   Lateral Step Up Right;2 sets;10  reps;Hand Hold: 2;Step Height: 6"   Forward Step Up Right;2 sets;10 reps;Hand Hold: 2;Step Height: 6"   Knee/Hip Exercises: Seated   Long Arc Quad Strengthening;Right;3 sets;10 reps;Weights   Long Arc Quad Weight 4 lbs.   Knee/Hip Exercises: Sidelying   Clams R clamshell x20 reps   Knee/Hip Exercises: Prone   Hip Extension Strengthening;Right;2 sets;10 reps   Modalities   Modalities Electrical Stimulation;Moist Heat;Iontophoresis   Moist Heat Therapy   Number Minutes Moist Heat 15 Minutes   Moist Heat Location Lumbar Spine;Hip   Electrical Stimulation   Electrical Stimulation Location R hip along incision   Electrical Stimulation Action Pre-Mod   Electrical Stimulation Parameters 80-150 Hz x15 min   Electrical Stimulation Goals Pain   Iontophoresis   Type of Iontophoresis Dexamethasone   Location R hip   Dose 1 ml   Time 8                  PT Short Term Goals - 09/06/15 1630    PT SHORT TERM GOAL #1   Title Ind with a HEP.   Time 3   Period Weeks   Status Achieved           PT Long Term Goals - 09/06/15 1630    PT LONG TERM GOAL #1   Title Perform ADL's with pain not > 3/10.   Time 6   Period Weeks   Status On-going   PT LONG TERM GOAL #2   Title Stand 20 minutes with pain not > 3/10.   Time 6   Period Weeks   Status On-going   PT LONG TERM GOAL #3   Title Walk a community distance with pain not > 3/10.   Status On-going               Plan - 09/06/15 1637    Clinical Impression Statement Patient tolerated today's treatment fairly well as she arrived with fatigue and 2/10 R hip pain. Patient required short rest breaks during NuStep today secondary to fatigue. Patient tolerated standing hip exercises well with resistance without report of pain. Patient did not report pain with step up activties and required moderate miultimodal cueing for proper sequencing. Patient continues to demonstrate weakness with prone hip extension. Normal modalities  response noted following removal of the modalities. Patient ambulates with no sign of significant deviation  today between exercises. Patient denied pain upon end of treatment.   Rehab Potential Excellent   PT Frequency 2x / week   PT Duration 6 weeks   PT Treatment/Interventions ADLs/Self Care Home Management;Electrical Stimulation;Iontophoresis 4mg /ml Dexamethasone;Moist Heat;Therapeutic exercise;Therapeutic activities;Patient/family  education;Manual techniques   PT Next Visit Plan Continue R hip strengthening with modalities PRN for pain and iontophoresis per MPT POC.   PT Home Exercise Plan HEP- SLR, ball/pillow squeeze, hip abduction   Consulted and Agree with Plan of Care Patient      Patient will benefit from skilled therapeutic intervention in order to improve the following deficits and impairments:  Pain, Decreased activity tolerance, Decreased strength  Visit Diagnosis: Muscle weakness (generalized)  Pain in right hip     Problem List Patient Active Problem List   Diagnosis Date Noted  . Memory problem 05/07/2015  . COPD (chronic obstructive pulmonary disease) (HCC) 04/19/2015  . Neck pain 04/06/2015  . Insomnia 12/10/2014  . BMI 28.0-28.9,adult 12/10/2014  . Osteopenia 05/19/2014  . GAD (generalized anxiety disorder) 01/06/2014  . Depression 01/06/2014  . GERD (gastroesophageal reflux disease) 01/06/2014  . Vitamin D deficiency 01/06/2014  . Overactive bladder 01/06/2014  . Secondary cardiomyopathy (HCC) 10/06/2013  . Left bundle branch block 10/06/2013  . Dizziness 10/04/2013  . Osteoarthritis of right hip 03/14/2013  . Allergic rhinitis 12/07/2008  . Chronic cough 12/07/2008  . Hepatitis B virus infection 08/16/2008  . Hyperlipidemia 08/16/2008  . Essential hypertension 08/16/2008  . Osteoarthritis 08/16/2008  . DISC DEGENERATION 07/24/2007    Evelene Croon, PTA 09/06/2015, 5:40 PM  [facility] [address] [place], [place], [address] Phone: [phone]   Fax:  [phone]  Name: ORLANDO THALMANN MRN: [id number] Date of Birth: 07/24/1934

## 2015-09-08 ENCOUNTER — Ambulatory Visit: Payer: Medicare Other | Admitting: Physical Therapy

## 2015-09-08 ENCOUNTER — Encounter: Payer: Self-pay | Admitting: Physical Therapy

## 2015-09-08 DIAGNOSIS — M6281 Muscle weakness (generalized): Secondary | ICD-10-CM | POA: Diagnosis not present

## 2015-09-08 DIAGNOSIS — M25551 Pain in right hip: Secondary | ICD-10-CM | POA: Diagnosis not present

## 2015-09-08 NOTE — Therapy (Signed)
[facility] [place], [place], [address] Phone: [phone]   Fax:  [phone]  Physical Therapy Treatment  Patient Details  Name: [name] MRN: [id number] Date of Birth: 05/15/1934 Referring Provider: Frederik Pear MD.  Encounter Date: 09/08/2015      PT End of Session - 09/08/15 1609    Visit Number 5   Number of Visits 12   Date for PT Re-Evaluation 10/18/15   PT Start Time 1600   PT Stop Time 1647   PT Time Calculation (min) 47 min   Activity Tolerance Patient tolerated treatment well   Behavior During Therapy [facility] for tasks assessed/performed      Past Medical History  Diagnosis Date  . Essential hypertension, benign   . Mixed hyperlipidemia   . Depression   . Allergic rhinitis   . GERD (gastroesophageal reflux disease)   . Arthritis   . History of blood transfusion   . Left bundle branch block   . History of cardiac catheterization     Normal coronaries 2010  . Secondary cardiomyopathy (HCC)     LVEF 40-45%, likely nonischemic  . Anxiety   . Osteopenia   . Cataract     had eye surgery    Past Surgical History  Procedure Laterality Date  . Cholecystectomy  1991  . Tonsillectomy    . Appendectomy    . Back surgery  07/21/2008  . Rotator cuff repair  2011    right  . Cataract extraction w/phaco  04/16/2011    Procedure: CATARACT EXTRACTION PHACO AND INTRAOCULAR LENS PLACEMENT (IOC);  Surgeon: Williams Che;  Location: [facility];  Service: Ophthalmology;  Laterality: Left;  CDE=11.35  . Hammer toe surgery  11/22/11    [place], Irving Shows  . Cataract extraction w/phaco  01/28/2012    Procedure: CATARACT EXTRACTION PHACO AND INTRAOCULAR LENS PLACEMENT (IOC);  Surgeon: Williams Che, MD;  Location: [facility];  Service: Ophthalmology;  Laterality: Right;  CDI:8.15  . Cysto with hydrodistension  05/29/2012    Procedure: CYSTOSCOPY/HYDRODISTENSION;  Surgeon: Reece Packer, MD;  Location: [facility];  Service: Urology;  Laterality: N/A;  INSTILLATION OF MARCAINE AND PYRIDIUM   . Lumbar laminectomy/decompression microdiscectomy Left 06/06/2012    Procedure: LUMBAR LAMINECTOMY/DECOMPRESSION MICRODISCECTOMY 1 LEVEL;  Surgeon: Erline Levine, MD;  Location: [facility];  Service: Neurosurgery;  Laterality: Left;  Left Lumbar two-three Laminectomy for resection of synovial cyst  . Total hip arthroplasty Right 03/13/2013    Procedure: TOTAL HIP ARTHROPLASTY;  Surgeon: Kerin Salen, MD;  Location: [facility];  Service: Orthopedics;  Laterality: Right;  . Eye surgery Bilateral     catar  . Abdominal hysterectomy      complete  . Spine surgery      back suregry 2011  . Joint replacement      right hip replacement 2013    There were no vitals filed for this visit.      Subjective Assessment - 09/08/15 1605    Subjective Reports that hip is "much better." Reports that hip bothers her if she is standing for a long time but that goes away with rest.   Pertinent History Right total hip replacement.   Limitations Walking   How long can you walk comfortably? Short distances.   Patient Stated Goals Get rid of my right hip pain.   Currently in Pain? Yes   Pain Score 1    Pain Location Hip   Pain  Orientation Right   Pain Descriptors / Indicators Dull   Pain Type Acute pain   Pain Onset More than a month ago   Pain Frequency Intermittent   Aggravating Factors  Up and standing for prolonged period   Pain Relieving Factors Rest            [facility] PT Assessment - 09/08/15 0001    Assessment   Medical Diagnosis Right hip bursitis.   Next MD Visit 08/2015 if needed   Precautions   Precaution Comments RT THA/NO ULTRASOUND.   Restrictions   Weight Bearing Restrictions No                     [facility] Adult PT Treatment/Exercise - 09/08/15 0001    Knee/Hip Exercises: Aerobic   Nustep L5 x12 min   Knee/Hip Exercises: Standing   Hip Flexion Stengthening;Right;2 sets;10  reps;Knee bent  3#   Hip Abduction Stengthening;Right;2 sets;10 reps;Knee straight  3#   Hip Extension Stengthening;Right;2 sets;10 reps;Knee straight  3#   Lateral Step Up Right;3 sets;10 reps;Hand Hold: 2;Step Height: 6"   Forward Step Up Right;2 sets;10 reps;Hand Hold: 2;Step Height: 6"   Other Standing Knee Exercises R standing HS curl 3# x20 reps   Knee/Hip Exercises: Seated   Long Arc Quad Strengthening;Right;3 sets;10 reps;Weights   Long Arc Quad Weight 4 lbs.   Knee/Hip Exercises: Supine   Bridges Strengthening;Both;2 sets;10 reps   Straight Leg Raises Strengthening;Right;2 sets;10 reps   Knee/Hip Exercises: Sidelying   Clams R clamshell x20 reps   Modalities   Modalities Electrical Stimulation;Moist Heat;Iontophoresis   Moist Heat Therapy   Number Minutes Moist Heat 15 Minutes   Moist Heat Location Lumbar Spine;Hip   Electrical Stimulation   Electrical Stimulation Location R hip along incision   Electrical Stimulation Action Pre-Mod   Electrical Stimulation Parameters 80-150 Hz x15 min   Electrical Stimulation Goals Pain   Iontophoresis   Type of Iontophoresis Dexamethasone   Location R hip   Dose 1 ml   Time 8                  PT Short Term Goals - 09/06/15 1630    PT SHORT TERM GOAL #1   Title Ind with a HEP.   Time 3   Period Weeks   Status Achieved           PT Long Term Goals - 09/08/15 1607    PT LONG TERM GOAL #1   Title Perform ADL's with pain not > 3/10.   Time 6   Period Weeks   Status Partially Met  Rates that pain "varies" with ADLs 09/08/2015   PT LONG TERM GOAL #2   Title Stand 20 minutes with pain not > 3/10.   Time 6   Period Weeks   Status Achieved   PT LONG TERM GOAL #3   Title Walk a community distance with pain not > 3/10.   Status On-going               Plan - 09/08/15 1632    Clinical Impression Statement Patient tolerated today's treatment fairly well today with only reports of fatigue with exercises but  no pain. Patient required moderate multimodal cueing for exercise technique with all exercises and requires short rest breaks due to fatigue. Standing HS curl and hip extension with resistance was completed in standing to as to strengthen in stanidng as to make it easier in prone as strength improves.  Patient's greatest weakness today was noted with L sidelying clamshell for R hip per patient report. Normal modalites response noted following removal of the modalities. Patient able to achieve standing goal today with ADLs and community ambulaton goal on-going at this time secondary to pain.   Rehab Potential Excellent   PT Frequency 2x / week   PT Duration 6 weeks   PT Treatment/Interventions ADLs/Self Care Home Management;Electrical Stimulation;Iontophoresis 53m/ml Dexamethasone;Moist Heat;Therapeutic exercise;Therapeutic activities;Patient/family education;Manual techniques   PT Next Visit Plan Continue R hip strengthening with modalities PRN for pain and iontophoresis per MPT POC.   PT Home Exercise Plan HEP- SLR, ball/pillow squeeze, hip abduction   Consulted and Agree with Plan of Care Patient      Patient will benefit from skilled therapeutic intervention in order to improve the following deficits and impairments:  Pain, Decreased activity tolerance, Decreased strength  Visit Diagnosis: Muscle weakness (generalized)  Pain in right hip     Problem List Patient Active Problem List   Diagnosis Date Noted  . Memory problem 05/07/2015  . COPD (chronic obstructive pulmonary disease) ([place] 04/19/2015  . Neck pain 04/06/2015  . Insomnia 12/10/2014  . BMI 28.0-28.9,adult 12/10/2014  . Osteopenia 05/19/2014  . GAD (generalized anxiety disorder) 01/06/2014  . Depression 01/06/2014  . GERD (gastroesophageal reflux disease) 01/06/2014  . Vitamin D deficiency 01/06/2014  . Overactive bladder 01/06/2014  . Secondary cardiomyopathy ([place] 10/06/2013  . Left bundle branch block 10/06/2013  .  Dizziness 10/04/2013  . Osteoarthritis of right hip 03/14/2013  . Allergic rhinitis 12/07/2008  . Chronic cough 12/07/2008  . Hepatitis B virus infection 08/16/2008  . Hyperlipidemia 08/16/2008  . Essential hypertension 08/16/2008  . Osteoarthritis 08/16/2008  . [place] 07/24/2007    KWynelle Fanny PTA 09/08/2015, 5:46 PM  [facility] [address] [address]Phone: [phone]  Fax:  [phone] Name: [name] BUCKMANMRN: [id number] of Birth: [birth year]

## 2015-09-13 ENCOUNTER — Ambulatory Visit (INDEPENDENT_AMBULATORY_CARE_PROVIDER_SITE_OTHER): Payer: Medicare Other | Admitting: Neurology

## 2015-09-13 ENCOUNTER — Other Ambulatory Visit: Payer: Self-pay | Admitting: Pediatrics

## 2015-09-13 ENCOUNTER — Encounter: Payer: Self-pay | Admitting: Neurology

## 2015-09-13 ENCOUNTER — Telehealth: Payer: Self-pay | Admitting: *Deleted

## 2015-09-13 VITALS — BP 137/82 | HR 95 | Ht 64.0 in | Wt 157.5 lb

## 2015-09-13 DIAGNOSIS — E559 Vitamin D deficiency, unspecified: Secondary | ICD-10-CM

## 2015-09-13 DIAGNOSIS — F0390 Unspecified dementia without behavioral disturbance: Secondary | ICD-10-CM

## 2015-09-13 DIAGNOSIS — K219 Gastro-esophageal reflux disease without esophagitis: Secondary | ICD-10-CM

## 2015-09-13 DIAGNOSIS — F03A0 Unspecified dementia, mild, without behavioral disturbance, psychotic disturbance, mood disturbance, and anxiety: Secondary | ICD-10-CM | POA: Insufficient documentation

## 2015-09-13 MED ORDER — ESOMEPRAZOLE MAGNESIUM 40 MG PO CPDR: 40.0000 mg | DELAYED_RELEASE_CAPSULE | Freq: Every day | ORAL | Status: DC

## 2015-09-13 MED ORDER — MEMANTINE HCL 10 MG PO TABS: 10.0000 mg | ORAL_TABLET | Freq: Two times a day (BID) | ORAL | Status: DC

## 2015-09-13 MED ORDER — DONEPEZIL HCL 10 MG PO TABS: 10.0000 mg | ORAL_TABLET | Freq: Every day | ORAL | Status: DC

## 2015-09-13 NOTE — Telephone Encounter (Signed)
done

## 2015-09-13 NOTE — Telephone Encounter (Signed)
Message For: O/C                  Taken 16-MAY-17 at  2:19PM by [facility] ------------------------------------------------------------ [name]              CID [id number]  Patient SAME                 Pt's [address] [place]          Area Code 336 Phone# [phone] * DOB DECL        RE NEEDS DIRECTIONS ON HOW TO GET TO OFC/PCB APPT    IS AT 3PM                                            Disp:Y/N Y If Y = C/B If No Response In ============================================================

## 2015-09-13 NOTE — Progress Notes (Signed)
PATIENT: [name] DOB: 05/09/1934  Chief Complaint  Patient presents with  . Memory Loss    MMSE 22/30 - 12 animals. She is here with her daughter, [name], to discuss her worsening memory and recent abnormal MRI results.     HISTORICAL  [name] is a 81 years old right-handed female, accompanied by her daughter [name], seen in refer by her primary care physician Dr.  [name] for evaluation of memory loss, abnormal MRI scan in Sep 13 2015  She had a history of hypertension, hyperlipidemia, depression, lumbar decompression surgery twice, history of right hip replacement.  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   She noticed memory loass since 2015, gradually getting worse, she got lost while driving on the familiar route, she could not find her way home after she dropped her son to work one morning, she has to call her daughter to find her, she began to make mistakes with her bill payment, her daughter now take over, she has lost interest in cooking, Her mother has Alzheimer's disease   have personally reviewed MRI brian in April 2017 significant atrophy Laboratory evaluation normal CBC, CMP showed elevated creatinine 1.09, normal B12 folic acid.  REVIEW OF SYSTEMS: Full 14 system review of systems performed and notable only for Weight gain, blurred vision, shortness of breath, cough, memory loss, confusion, weakness, insomnia, sleepiness, snoring   ALLERGIES: Allergies  Allergen Reactions  . Lisinopril-Hydrochlorothiazide Other (See Comments)    Lip swelling and cough. Stopped by ENT. Patient is on Losartan without any issues   . Codeine Itching and Swelling    eye irritation (prescribed percocet)  . Penicillins Itching and Swelling    eye irritation    HOME MEDICATIONS: Current Outpatient Prescriptions    Medication Sig Dispense Refill  . albuterol (PROVENTIL HFA;VENTOLIN HFA) 108 (90 Base) MCG/ACT inhaler Inhale 2 puffs into the lungs every 6 (six) hours as needed for wheezing or shortness of breath. 1 Inhaler 1  . amitriptyline (ELAVIL) 25 MG tablet Take 1 tablet (25 mg total) by mouth at bedtime. 90 tablet 1  . aspirin EC 81 MG tablet Take 81 mg by mouth daily.    . Biotin 5000 MCG CAPS Take 1 capsule by mouth daily.    . budesonide-formoterol (SYMBICORT) 160-4.5 MCG/ACT inhaler Inhale 2 puffs into the lungs 2 (two) times daily. 1 Inhaler 3  . buPROPion (WELLBUTRIN SR) 150 MG 12 hr tablet Take 1 tablet (150 mg total) by mouth 2 (two) times daily. 180 tablet 1  . calcium citrate-vitamin D (CITRACAL+D) 315-200 MG-UNIT per tablet Take 2 tablets by mouth daily.     . carvedilol (COREG) 6.25 MG tablet Take 1 tablet (6.25 mg total) by mouth 2 (two) times daily with a meal. 60 tablet 11  . cetirizine (ZYRTEC) 10 MG tablet Take 1 tablet (10 mg total) by mouth daily. 90 tablet 3  . citalopram (CELEXA) 10 MG tablet Take 1 tablet (10 mg total) by mouth daily. 30 tablet 1  . esomeprazole (NEXIUM) 40 MG capsule Take 1 capsule (40 mg total) by mouth daily before breakfast. 90 capsule 1  . fluticasone (FLONASE) 50 MCG/ACT nasal spray Place 2 sprays into both nostrils daily. 16 g 2  . furosemide (LASIX) 20 MG tablet Take 0.5 tablets (10 mg total) by  mouth daily. (Patient taking differently: Take 20 mg by mouth daily. ) 30 tablet 5  . HYDROcodone-acetaminophen (NORCO/VICODIN) 5-325 MG tablet Take 1 tablet by mouth every 6 (six) hours as needed for moderate pain or severe pain. 8 tablet 0  . ibuprofen (ADVIL,MOTRIN) 800 MG tablet TAKE  ONE TABLET BY MOUTH EVERY 8 HOURS AS NEEDED 30 tablet 1  . losartan (COZAAR) 50 MG tablet TAKE 1 TABLET (50 MG TOTAL) BY MOUTH DAILY. (Patient taking differently: Take 25 mg by mouth daily. ) 90 tablet 1  . methocarbamol (ROBAXIN) 500 MG tablet Take 1 tablet (500 mg total) by mouth  every 8 (eight) hours as needed for muscle spasms. 20 tablet 0  . ondansetron (ZOFRAN) 4 MG tablet TAKE 1 TABLET (4 MG TOTAL) BY MOUTH EVERY 12 (TWELVE) HOURS AS NEEDED FOR NAUSEA. 10 tablet 0  . raloxifene (EVISTA) 60 MG tablet Take 1 tablet (60 mg total) by mouth daily. 90 tablet 1  . ranitidine (ZANTAC) 150 MG tablet TAKE ONE TABLET BY MOUTH IN THE EVENING 90 tablet 0  . simvastatin (ZOCOR) 10 MG tablet TAKE 1 TABLET (10 MG TOTAL) BY MOUTH AT BEDTIME. 90 tablet 1  . vitamin B-12 (CYANOCOBALAMIN) 1000 MCG tablet Take 0.5 tablets (500 mcg total) by mouth daily. 45 tablet 3  . Vitamin D, Ergocalciferol, (DRISDOL) 50000 UNITS CAPS capsule Take 1 capsule (50,000 Units total) by mouth once a week. (Patient taking differently: Take 50,000 Units by mouth every Monday. ) 12 capsule 6   No current facility-administered medications for this visit.    PAST MEDICAL HISTORY: Past Medical History  Diagnosis Date  . Essential hypertension, benign   . Mixed hyperlipidemia   . Depression   . Allergic rhinitis   . GERD (gastroesophageal reflux disease)   . Arthritis   . History of blood transfusion   . Left bundle branch block   . History of cardiac catheterization     Normal coronaries 2010  . Secondary cardiomyopathy (HCC)     LVEF 40-45%, likely nonischemic  . Anxiety   . Osteopenia   . Cataract     had eye surgery  . Memory loss     PAST SURGICAL HISTORY: Past Surgical History  Procedure Laterality Date  . Cholecystectomy  1991  . Tonsillectomy    . Appendectomy    . Back surgery  07/21/2008  . Rotator cuff repair  2011    right  . Cataract extraction w/phaco  04/16/2011    Procedure: CATARACT EXTRACTION PHACO AND INTRAOCULAR LENS PLACEMENT (IOC);  Surgeon: Susa Simmonds;  Location: [facility];  Service: Ophthalmology;  Laterality: Left;  CDE=11.35  . Hammer toe surgery  11/22/11    [facility], [name]  . Cataract extraction w/phaco  01/28/2012    Procedure: CATARACT EXTRACTION PHACO AND  INTRAOCULAR LENS PLACEMENT (IOC);  Surgeon: Susa Simmonds, MD;  Location: [facility];  Service: Ophthalmology;  Laterality: Right;  CDI:8.15  . Cysto with hydrodistension  05/29/2012    Procedure: CYSTOSCOPY/HYDRODISTENSION;  Surgeon: Martina Sinner, MD;  [facility]  Service: Urology;  Laterality: N/A;  INSTILLATION OF MARCAINE AND PYRIDIUM   . Lumbar laminectomy/decompression microdiscectomy Left 06/06/2012    Procedure: LUMBAR LAMINECTOMY/DECOMPRESSION MICRODISCECTOMY 1 LEVEL;  Surgeon: Maeola Harman, MD;  Location: [facility];  Service: Neurosurgery;  Laterality: Left;  Left Lumbar two-three Laminectomy for resection of synovial cyst  . Total hip arthroplasty Right 03/13/2013    Procedure: TOTAL HIP ARTHROPLASTY;  Surgeon: Homero Fellers  Tawni Carnes, MD;  Location: [facility];  Service: Orthopedics;  Laterality: Right;  . Eye surgery Bilateral     catar  . Abdominal hysterectomy      complete  . Spine surgery      back suregry 2011  . Joint replacement      right hip replacement 2013    FAMILY HISTORY: Family History  Problem Relation Age of Onset  . Colon cancer Neg Hx   . Anesthesia problems Neg Hx   . Hypotension Neg Hx   . Malignant hyperthermia Neg Hx   . Pseudochol deficiency Neg Hx   . Diabetes Other   . Allergies Other   . Diabetes Father   . Diabetes Brother   . Cancer Brother 53    colon  . Diabetes Brother   . Diabetes Brother   . Hypertension Sister   . Diabetes Sister   . Diabetes Sister     SOCIAL HISTORY:  Social History   Social History  . Marital Status: Widowed    Spouse Name: N/A  . Number of Children: 5  . Years of Education: 8th grade   Occupational History  . 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 Part time care giver    Social History Main Topics  . Smoking status: Former Smoker -- 0.10 packs/day for 40 years    Types: Cigarettes  . Smokeless tobacco: Never Used     Comment: smokes 2 cigarettes a day    . Alcohol Use: No  . Drug Use: No  . Sexual Activity: No   Other Topics Concern  . Not on file   Social History Narrative   Divorced, lives alone.   Right-handed.   1 cup coffee per day.     PHYSICAL EXAM   Filed Vitals:   09/13/15 1518  BP: 137/82  Pulse: 95  Height: 5\' 4"  (1.626 m)  Weight: 157 lb 8 oz (71.442 kg)    Not recorded      Body mass index is 27.02 kg/(m^2).  PHYSICAL EXAMNIATION:  Gen: NAD, conversant, well nourised, obese, well groomed                     Cardiovascular: Regular rate rhythm, no peripheral edema, warm, nontender. Eyes: Conjunctivae clear without exudates or hemorrhage Neck: Supple, no carotid bruise. Pulmonary: Clear to auscultation bilaterally   NEUROLOGICAL EXAM:  MENTAL STATUS: Speech:    Speech is normal; fluent and spontaneous with normal comprehension.  Cognition:Mini-Mental Status Examination 22/30     Orientation: She is not oriented to date,     Recent and remote memory: She missed 2/3 recall     Attention span and concentration: She could not spell world backwards     Normal Language, naming, repeating,spontaneous speech: She could not copy figure     Fund of knowledge   CRANIAL NERVES: CN II: Visual fields are full to confrontation. Fundoscopic exam is normal with sharp discs and no vascular changes. Pupils are round equal and briskly reactive to light. CN III, IV, VI: extraocular movement are normal. No ptosis. CN V: Facial sensation is intact to pinprick in all 3 divisions bilaterally. Corneal responses are intact.  CN VII: Face is symmetric with normal eye closure and smile. CN VIII: Hearing is normal to rubbing fingers CN IX, X: Palate elevates symmetrically. Phonation is normal. CN XI: Head turning and shoulder shrug are intact CN XII: Tongue is midline  with normal movements and no atrophy.  MOTOR: There is no pronator drift of out-stretched arms. Muscle bulk and tone are normal. Muscle strength is  normal.  REFLEXES: Reflexes are 2+ and symmetric at the biceps, triceps, knees, and ankles. Plantar responses are flexor.  SENSORY: Intact to light touch, pinprick, positional sensation and vibratory sensation are intact in fingers and toes.  COORDINATION: Rapid alternating movements and fine finger movements are intact. There is no dysmetria on finger-to-nose and heel-knee-shin.    GAIT/STANCE: Posture is normal. Gait is steady with normal steps, base, arm swing, and turning. Heel and toe walking are normal. Tandem gait is normal.  Romberg is absent.   DIAGNOSTIC DATA (LABS, IMAGING, TESTING) - I reviewed patient records, labs, notes, testing and imaging myself where available.   ASSESSMENT AND PLAN  AMARA DENARDIS is a 81 y.o. female   Mild dementia without behavioral issue  Strong family history of Alzheimer's dementia  Most likely central nervous system disorder Alzheimer's dementia  Started Namenda 10 mg twice a day, Aricept 10 mg once a day  Laboratory evaluation TSH  She is interested in dementia trial   Levert Feinstein, M.D. Ph.D.  [facility] [address] [place], [place] [address] Ph: [phone] Fax: [phone]  CC: [name], MD

## 2015-09-14 ENCOUNTER — Telehealth: Payer: Self-pay | Admitting: Pediatrics

## 2015-09-14 LAB — TSH: TSH: 0.446 u[IU]/mL — AB (ref 0.450–4.500)

## 2015-09-14 NOTE — Telephone Encounter (Signed)
Done 09/13/15

## 2015-09-15 ENCOUNTER — Encounter: Payer: Medicare Other | Admitting: Physical Therapy

## 2015-09-19 ENCOUNTER — Other Ambulatory Visit: Payer: Self-pay | Admitting: Pediatrics

## 2015-09-20 ENCOUNTER — Encounter: Payer: Self-pay | Admitting: Physical Therapy

## 2015-09-20 ENCOUNTER — Ambulatory Visit: Payer: Medicare Other | Admitting: Physical Therapy

## 2015-09-20 DIAGNOSIS — M25551 Pain in right hip: Secondary | ICD-10-CM

## 2015-09-20 DIAGNOSIS — M6281 Muscle weakness (generalized): Secondary | ICD-10-CM | POA: Diagnosis not present

## 2015-09-20 MED ORDER — ONDANSETRON HCL 4 MG PO TABS: 4.0000 mg | ORAL_TABLET | Freq: Three times a day (TID) | ORAL | Status: DC | PRN

## 2015-09-20 NOTE — Therapy (Addendum)
[facility] [place], [place], [address] Phone: [phone]   Fax:  [phone]  Physical Therapy Treatment  Patient Details  Name: [name] MRN: [id number] Date of Birth: 07-24-1934 Referring Provider: Frederik Pear MD.  Encounter Date: 09/20/2015      PT End of Session - 09/20/15 1601    Visit Number 6   Number of Visits 12   Date for PT Re-Evaluation 10/18/15   PT Start Time 1600   PT Stop Time 1646   PT Time Calculation (min) 46 min   Activity Tolerance Patient tolerated treatment well   Behavior During Therapy [facility] for tasks assessed/performed      Past Medical History  Diagnosis Date  . Essential hypertension, benign   . Mixed hyperlipidemia   . Depression   . Allergic rhinitis   . GERD (gastroesophageal reflux disease)   . Arthritis   . History of blood transfusion   . Left bundle branch block   . History of cardiac catheterization     Normal coronaries 2010  . Secondary cardiomyopathy (HCC)     LVEF 40-45%, likely nonischemic  . Anxiety   . Osteopenia   . Cataract     had eye surgery  . Memory loss     Past Surgical History  Procedure Laterality Date  . Cholecystectomy  1991  . Tonsillectomy    . Appendectomy    . Back surgery  07/21/2008  . Rotator cuff repair  2011    right  . Cataract extraction w/phaco  04/16/2011    Procedure: CATARACT EXTRACTION PHACO AND INTRAOCULAR LENS PLACEMENT (IOC);  Surgeon: Williams Che;  Location: [facility];  Service: Ophthalmology;  Laterality: Left;  CDE=11.35  . Hammer toe surgery  11/22/11    Spivey, Irving Shows  . Cataract extraction w/phaco  01/28/2012    Procedure: CATARACT EXTRACTION PHACO AND INTRAOCULAR LENS PLACEMENT (IOC);  Surgeon: Williams Che, MD;  Location: [facility];  Service: Ophthalmology;  Laterality: Right;  CDI:8.15  . Cysto with hydrodistension  05/29/2012    Procedure: CYSTOSCOPY/HYDRODISTENSION;  Surgeon: Reece Packer, MD;  Location:  [facility];  Service: Urology;  Laterality: N/A;  INSTILLATION OF MARCAINE AND PYRIDIUM   . Lumbar laminectomy/decompression microdiscectomy Left 06/06/2012    Procedure: LUMBAR LAMINECTOMY/DECOMPRESSION MICRODISCECTOMY 1 LEVEL;  Surgeon: Erline Levine, MD;  Location: [facility];  Service: Neurosurgery;  Laterality: Left;  Left Lumbar two-three Laminectomy for resection of synovial cyst  . Total hip arthroplasty Right 03/13/2013    Procedure: TOTAL HIP ARTHROPLASTY;  Surgeon: Kerin Salen, MD;  Location: [facility];  Service: Orthopedics;  Laterality: Right;  . Eye surgery Bilateral     catar  . Abdominal hysterectomy      complete  . Spine surgery      back suregry 2011  . Joint replacement      right hip replacement 2013    There were no vitals filed for this visit.      Subjective Assessment - 09/20/15 1600    Subjective Reports R hip is "much, much better." Reports no pain "and it's rainy and cloudy outside."   Pertinent History Right total hip replacement.   Limitations Walking   How long can you walk comfortably? Short distances.   Patient Stated Goals Get rid of my right hip pain.   Currently in Pain? No/denies            [facility] PT Assessment - 09/20/15 0001  Assessment   Medical Diagnosis Right hip bursitis.   Next MD Visit 08/2015 if needed   Precautions   Precaution Comments RT THA/NO ULTRASOUND.   Restrictions   Weight Bearing Restrictions No                     [facility] Adult PT Treatment/Exercise - 09/20/15 0001    Knee/Hip Exercises: Aerobic   Nustep L5 x12 min   Knee/Hip Exercises: Standing   Hip Flexion Stengthening;Right;3 sets;10 reps;Knee bent  3#   Hip Abduction Stengthening;Right;3 sets;10 reps;Knee straight  3#   Hip Extension Stengthening;Right;3 sets;10 reps;Knee straight  3#   Lateral Step Up Right;2 sets;10 reps;Hand Hold: 2;Step Height: 6"   Forward Step Up Right;3 sets;10 reps;Hand Hold: 2;Step Height: 6"   Other  Standing Knee Exercises R standing HS curl 3# x30 reps   Knee/Hip Exercises: Seated   Long Arc Quad Strengthening;Right;3 sets;10 reps;Weights   Long Arc Quad Weight 4 lbs.   Sit to Sand 1 set;15 reps;without UE support   Knee/Hip Exercises: Supine   Bridges Strengthening;3 sets;10 reps   Straight Leg Raises Strengthening;Right;3 sets;10 reps   Knee/Hip Exercises: Sidelying   Clams R clamshell x20 reps   Modalities   Modalities Iontophoresis   Iontophoresis   Type of Iontophoresis Dexamethasone   Location R hip   Dose 1 ml 6/6   Time 8                  PT Short Term Goals - 09/06/15 1630    PT SHORT TERM GOAL #1   Title Ind with a HEP.   Time 3   Period Weeks   Status Achieved           PT Long Term Goals - 09/20/15 1648    PT LONG TERM GOAL #1   Title Perform ADL's with pain not > 3/10.   Time 6   Period Weeks   Status Achieved   PT LONG TERM GOAL #2   Title Stand 20 minutes with pain not > 3/10.   Time 6   Period Weeks   Status Achieved   PT LONG TERM GOAL #3   Title Walk a community distance with pain not > 3/10.   Status Achieved               Plan - 09/20/15 1648    Clinical Impression Statement Patient continues to tolerate treatments fairly well with only reports of fatigue with exercises today. Patient was able to tolerate increased repititions although short rest breaks were required towards the end of the 2nd set of exercises. Hip abduction continues to demonstrate weakness as patient reported she could not complete as many lateral step ups and rest break required sooner for standing hip abduction with 3#. Iontophorsis order was completed today with sixth patch placed over R hip today. Patient has now achieved all goals set at evaluation. Patient denied pain following today's treatment.   Rehab Potential Excellent   PT Frequency 2x / week   PT Duration 6 weeks   PT Treatment/Interventions ADLs/Self Care Home Management;Electrical  Stimulation;Iontophoresis 56m/ml Dexamethasone;Moist Heat;Therapeutic exercise;Therapeutic activities;Patient/family education;Manual techniques   PT Next Visit Plan Continue R hip strengthening with modalities PRN for pain per MPT POC.   PT Home Exercise Plan HEP- SLR, ball/pillow squeeze, hip abduction   Consulted and Agree with Plan of Care Patient      Patient will benefit from skilled therapeutic intervention in order to improve  the following deficits and impairments:  Pain, Decreased activity tolerance, Decreased strength  Visit Diagnosis: Muscle weakness (generalized)  Pain in right hip     Problem List Patient Active Problem List   Diagnosis Date Noted  . Dementia 09/13/2015  . Memory problem 05/07/2015  . COPD (chronic obstructive pulmonary disease) ([place]) 04/19/2015  . Neck pain 04/06/2015  . Insomnia 12/10/2014  . BMI 28.0-28.9,adult 12/10/2014  . Osteopenia 05/19/2014  . GAD (generalized anxiety disorder) 01/06/2014  . Depression 01/06/2014  . GERD (gastroesophageal reflux disease) 01/06/2014  . Vitamin D deficiency 01/06/2014  . Overactive bladder 01/06/2014  . Secondary cardiomyopathy ([place]) 10/06/2013  . Left bundle branch block 10/06/2013  . Dizziness 10/04/2013  . Osteoarthritis of right hip 03/14/2013  . Allergic rhinitis 12/07/2008  . Chronic cough 12/07/2008  . Hepatitis B virus infection 08/16/2008  . Hyperlipidemia 08/16/2008  . Essential hypertension 08/16/2008  . Osteoarthritis 08/16/2008  . [place] DEGENERATION 07/24/2007    Wynelle Fanny, PTA 09/20/2015, 4:57 PM  [facility] [address] [place], [place], [address] Phone: [phone]   Fax:  [phone]  Name: [name] MRN: [id number] Date of Birth: 1935/01/30   PHYSICAL THERAPY DISCHARGE SUMMARY  Visits from Start of Care: 6.  Current functional level related to goals / functional outcomes: See above.   Remaining deficits: All  goals met.   Education / Equipment: HEP. Plan: Patient agrees to discharge.  Patient goals were met. Patient is being discharged due to meeting the stated rehab goals.  ?????          Mali Applegate MPT

## 2015-09-23 ENCOUNTER — Ambulatory Visit (INDEPENDENT_AMBULATORY_CARE_PROVIDER_SITE_OTHER): Payer: Medicare Other | Admitting: Family Medicine

## 2015-09-23 ENCOUNTER — Encounter (INDEPENDENT_AMBULATORY_CARE_PROVIDER_SITE_OTHER): Payer: Self-pay

## 2015-09-23 ENCOUNTER — Encounter: Payer: Self-pay | Admitting: Family Medicine

## 2015-09-23 ENCOUNTER — Ambulatory Visit: Payer: Medicare Other | Admitting: Family Medicine

## 2015-09-23 VITALS — BP 140/84 | HR 75 | Temp 97.4°F | Ht 64.0 in | Wt 156.0 lb

## 2015-09-23 DIAGNOSIS — R609 Edema, unspecified: Secondary | ICD-10-CM

## 2015-09-23 DIAGNOSIS — J449 Chronic obstructive pulmonary disease, unspecified: Secondary | ICD-10-CM | POA: Diagnosis not present

## 2015-09-23 MED ORDER — ALBUTEROL SULFATE HFA 108 (90 BASE) MCG/ACT IN AERS: 2.0000 | INHALATION_SPRAY | Freq: Four times a day (QID) | RESPIRATORY_TRACT | Status: DC | PRN

## 2015-09-23 MED ORDER — CARVEDILOL 6.25 MG PO TABS: 6.2500 mg | ORAL_TABLET | Freq: Two times a day (BID) | ORAL | Status: DC

## 2015-09-23 MED ORDER — FUROSEMIDE 20 MG PO TABS: 10.0000 mg | ORAL_TABLET | Freq: Every day | ORAL | Status: DC

## 2015-09-23 MED ORDER — ONDANSETRON HCL 4 MG PO TABS: 4.0000 mg | ORAL_TABLET | Freq: Three times a day (TID) | ORAL | Status: DC | PRN

## 2015-09-23 NOTE — Progress Notes (Signed)
Subjective:    Patient ID: [name], female    DOB: 01/27/1935, 81 y.o.   MRN: [id number]  HPI Patient here today for elevated BP. She is taking medication ir- regularly. She is accompanied today by her daughter.  Patient is followed by neurologist and apparently scored poorly on the mental status exam so it is thought she has some dementia. She is on both Aricept and Namenda. She has problems with remembering to take her medicines hence her blood pressure has been fluctuating. She has infected been out of one of HER-2 blood pressure medicines. She lives with her son who does not really involve himself with her medical needs. 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 The daughter who accompanies her today says that if things do not improve she will take her to live with her and watch over her medicines and other needs.       Patient Active Problem List   Diagnosis Date Noted  . Dementia 09/13/2015  . Memory problem 05/07/2015  . COPD (chronic obstructive pulmonary disease) ([place]) 04/19/2015  . Neck pain 04/06/2015  . Insomnia 12/10/2014  . BMI 28.0-28.9,adult 12/10/2014  . Osteopenia 05/19/2014  . GAD (generalized anxiety disorder) 01/06/2014  . Depression 01/06/2014  . GERD (gastroesophageal reflux disease) 01/06/2014  . Vitamin D deficiency 01/06/2014  . Overactive bladder 01/06/2014  . Secondary cardiomyopathy ([place]) 10/06/2013  . Left bundle branch block 10/06/2013  . Dizziness 10/04/2013  . Osteoarthritis of right hip 03/14/2013  . Allergic rhinitis 12/07/2008  . Chronic cough 12/07/2008  . Hepatitis B virus infection 08/16/2008  . Hyperlipidemia 08/16/2008  . Essential hypertension 08/16/2008  . Osteoarthritis 08/16/2008  . [place] DEGENERATION 07/24/2007   Outpatient Encounter Prescriptions as of 09/23/2015  Medication Sig  . albuterol (PROVENTIL HFA;VENTOLIN HFA) 108 (90 Base) MCG/ACT inhaler Inhale 2 puffs into the lungs every 6  (six) hours as needed for wheezing or shortness of breath.  Marland Kitchen amitriptyline (ELAVIL) 25 MG tablet Take 1 tablet (25 mg total) by mouth at bedtime.  Marland Kitchen aspirin EC 81 MG tablet Take 81 mg by mouth daily.  . Biotin 5000 MCG CAPS Take 1 capsule by mouth daily.  . budesonide-formoterol (SYMBICORT) 160-4.5 MCG/ACT inhaler Inhale 2 puffs into the lungs 2 (two) times daily.  Marland Kitchen buPROPion (WELLBUTRIN SR) 150 MG 12 hr tablet Take 1 tablet (150 mg total) by mouth 2 (two) times daily.  . calcium citrate-vitamin D (CITRACAL+D) 315-200 MG-UNIT per tablet Take 2 tablets by mouth daily.   . cetirizine (ZYRTEC) 10 MG tablet Take 1 tablet (10 mg total) by mouth daily.  . citalopram (CELEXA) 10 MG tablet Take 1 tablet (10 mg total) by mouth daily.  Marland Kitchen donepezil (ARICEPT) 10 MG tablet Take 1 tablet (10 mg total) by mouth at bedtime.  Marland Kitchen esomeprazole (NEXIUM) 40 MG capsule Take 1 capsule (40 mg total) by mouth daily before breakfast.  . fluticasone (FLONASE) 50 MCG/ACT nasal spray Place 2 sprays into both nostrils daily.  . furosemide (LASIX) 20 MG tablet Take 0.5 tablets (10 mg total) by mouth daily. (Patient taking differently: Take 20 mg by mouth daily. )  . HYDROcodone-acetaminophen (NORCO/VICODIN) 5-325 MG tablet Take 1 tablet by mouth every 6 (six) hours as needed for moderate pain or severe pain.  Marland Kitchen ibuprofen (ADVIL,MOTRIN) 800 MG tablet TAKE  ONE TABLET BY MOUTH EVERY 8 HOURS AS NEEDED  . losartan (COZAAR) 50 MG tablet TAKE 1 TABLET (50 MG TOTAL)  BY MOUTH DAILY. (Patient taking differently: Take 25 mg by mouth daily. )  . memantine (NAMENDA) 10 MG tablet Take 1 tablet (10 mg total) by mouth 2 (two) times daily.  . methocarbamol (ROBAXIN) 500 MG tablet Take 1 tablet (500 mg total) by mouth every 8 (eight) hours as needed for muscle spasms.  . ondansetron (ZOFRAN) 4 MG tablet Take 1 tablet (4 mg total) by mouth every 8 (eight) hours as needed for nausea or vomiting.  . raloxifene (EVISTA) 60 MG tablet Take 1 tablet  (60 mg total) by mouth daily.  . simvastatin (ZOCOR) 10 MG tablet TAKE 1 TABLET (10 MG TOTAL) BY MOUTH AT BEDTIME.  . vitamin B-12 (CYANOCOBALAMIN) 1000 MCG tablet Take 0.5 tablets (500 mcg total) by mouth daily.  . Vitamin D, Ergocalciferol, (DRISDOL) 50000 UNITS CAPS capsule Take 1 capsule (50,000 Units total) by mouth once a week. (Patient taking differently: Take 50,000 Units by mouth every Monday. )  . [DISCONTINUED] ranitidine (ZANTAC) 150 MG tablet TAKE ONE TABLET BY MOUTH IN THE EVENING  . carvedilol (COREG) 6.25 MG tablet Take 1 tablet (6.25 mg total) by mouth 2 (two) times daily with a meal. (Patient not taking: Reported on 09/23/2015)   No facility-administered encounter medications on file as of 09/23/2015.     Review of Systems  Constitutional: Negative.   HENT: Negative.   Eyes: Negative.   Respiratory: Negative.   Cardiovascular: Negative.   Gastrointestinal: Negative.   Endocrine: Negative.   Genitourinary: Negative.   Musculoskeletal: Positive for back pain.  Skin: Negative.   Allergic/Immunologic: Negative.   Neurological: Negative.   Hematological: Negative.   Psychiatric/Behavioral: Negative.        Objective:   Physical Exam  Constitutional: She appears well-developed and well-nourished.  Cardiovascular: Normal rate, regular rhythm and normal heart sounds.   Pulmonary/Chest: Effort normal and breath sounds normal.  Psychiatric:  Patient able to carry on normal conversation. She remembers what she ate for supper last night area there are some memory lapses and are normal office visit conversation but her dementia is fairly early.    BP 140/84 mmHg  Pulse 75  Temp(Src) 97.4 F (36.3 C) (Oral)  Ht 5' 4" (1.626 m)  Wt 156 lb (70.761 kg)  BMI 26.76 kg/m2       Assessment & Plan:  1. Chronic obstructive pulmonary disease, unspecified COPD type ([place]) Encouraged her to use her Symbicort regularly and use albuterol as a rescue drug. - albuterol (PROVENTIL  HFA;VENTOLIN HFA) 108 (90 Base) MCG/ACT inhaler; Inhale 2 puffs into the lungs every 6 (six) hours as needed for wheezing or shortness of breath.  Dispense: 1 Inhaler; Refill: 11  2. Peripheral edema Edema is not an everyday occurrence. Encourage use of Lasix as needed - furosemide (LASIX) 20 MG tablet; Take 0.5 tablets (10 mg total) by mouth daily.  Dispense: 30 tablet; Refill: 5  Wardell Honour MD

## 2015-09-27 ENCOUNTER — Ambulatory Visit: Payer: Medicare Other | Admitting: Physical Therapy

## 2015-09-27 ENCOUNTER — Telehealth: Payer: Self-pay | Admitting: Nurse Practitioner

## 2015-09-27 NOTE — Telephone Encounter (Signed)
Okay to refill bottle of Zofran 4 mg 3 times a day when necessary nausea vomiting #10

## 2015-09-27 NOTE — Telephone Encounter (Signed)
Is it okay to refill? Please advise and send back to the pools.

## 2015-09-28 ENCOUNTER — Emergency Department (HOSPITAL_COMMUNITY)
Admission: EM | Admit: 2015-09-28 | Discharge: 2015-09-28 | Disposition: A | Discharge status: Home / Self Care | Payer: Medicare Other | Attending: Emergency Medicine | Admitting: Emergency Medicine

## 2015-09-28 ENCOUNTER — Emergency Department (HOSPITAL_COMMUNITY): Payer: Medicare Other

## 2015-09-28 ENCOUNTER — Encounter (HOSPITAL_COMMUNITY): Payer: Self-pay

## 2015-09-28 DIAGNOSIS — Z7982 Long term (current) use of aspirin: Secondary | ICD-10-CM | POA: Diagnosis not present

## 2015-09-28 DIAGNOSIS — R112 Nausea with vomiting, unspecified: Secondary | ICD-10-CM | POA: Insufficient documentation

## 2015-09-28 DIAGNOSIS — F17210 Nicotine dependence, cigarettes, uncomplicated: Secondary | ICD-10-CM | POA: Diagnosis not present

## 2015-09-28 DIAGNOSIS — M419 Scoliosis, unspecified: Secondary | ICD-10-CM | POA: Diagnosis not present

## 2015-09-28 DIAGNOSIS — M545 Low back pain, unspecified: Secondary | ICD-10-CM

## 2015-09-28 DIAGNOSIS — F329 Major depressive disorder, single episode, unspecified: Secondary | ICD-10-CM | POA: Insufficient documentation

## 2015-09-28 DIAGNOSIS — I10 Essential (primary) hypertension: Secondary | ICD-10-CM | POA: Insufficient documentation

## 2015-09-28 DIAGNOSIS — R300 Dysuria: Secondary | ICD-10-CM | POA: Diagnosis not present

## 2015-09-28 DIAGNOSIS — Z79899 Other long term (current) drug therapy: Secondary | ICD-10-CM | POA: Insufficient documentation

## 2015-09-28 DIAGNOSIS — E782 Mixed hyperlipidemia: Secondary | ICD-10-CM | POA: Insufficient documentation

## 2015-09-28 DIAGNOSIS — M1990 Unspecified osteoarthritis, unspecified site: Secondary | ICD-10-CM | POA: Diagnosis not present

## 2015-09-28 LAB — COMPREHENSIVE METABOLIC PANEL
ALT: 19 U/L (ref 14–54)
AST: 22 U/L (ref 15–41)
Albumin: 3.7 g/dL (ref 3.5–5.0)
Alkaline Phosphatase: 93 U/L (ref 38–126)
Anion gap: 5 (ref 5–15)
BUN: 19 mg/dL (ref 6–20)
CHLORIDE: 111 mmol/L (ref 101–111)
CO2: 26 mmol/L (ref 22–32)
Calcium: 8.7 mg/dL — ABNORMAL LOW (ref 8.9–10.3)
Creatinine, Ser: 1.08 mg/dL — ABNORMAL HIGH (ref 0.44–1.00)
GFR, EST AFRICAN AMERICAN: 55 mL/min — AB (ref >60)
GFR, EST NON AFRICAN AMERICAN: 48 mL/min — AB (ref >60)
Glucose, Bld: 90 mg/dL (ref 65–99)
POTASSIUM: 3.9 mmol/L (ref 3.5–5.1)
SODIUM: 142 mmol/L (ref 135–145)
Total Bilirubin: 0.2 mg/dL — ABNORMAL LOW (ref 0.3–1.2)
Total Protein: 6.7 g/dL (ref 6.5–8.1)

## 2015-09-28 LAB — CBC WITH DIFFERENTIAL/PLATELET
BASOS ABS: 0 10*3/uL (ref 0.0–0.1)
Basophils Relative: 0 %
EOS ABS: 0.1 10*3/uL (ref 0.0–0.7)
EOS PCT: 1 %
HCT: 41.5 % (ref 36.0–46.0)
Hemoglobin: 13.6 g/dL (ref 12.0–15.0)
Lymphocytes Relative: 35 %
Lymphs Abs: 2.9 10*3/uL (ref 0.7–4.0)
MCH: 30 pg (ref 26.0–34.0)
MCHC: 32.8 g/dL (ref 30.0–36.0)
MCV: 91.4 fL (ref 78.0–100.0)
MONO ABS: 0.3 10*3/uL (ref 0.1–1.0)
Monocytes Relative: 4 %
Neutro Abs: 5 10*3/uL (ref 1.7–7.7)
Neutrophils Relative %: 60 %
PLATELETS: 302 10*3/uL (ref 150–400)
RBC: 4.54 MIL/uL (ref 3.87–5.11)
RDW: 15.1 % (ref 11.5–15.5)
WBC: 8.3 10*3/uL (ref 4.0–10.5)

## 2015-09-28 LAB — URINALYSIS, ROUTINE W REFLEX MICROSCOPIC
Bilirubin Urine: NEGATIVE
Glucose, UA: NEGATIVE mg/dL
Hgb urine dipstick: NEGATIVE
KETONES UR: NEGATIVE mg/dL
Leukocytes, UA: NEGATIVE
NITRITE: NEGATIVE
PROTEIN: NEGATIVE mg/dL
SPECIFIC GRAVITY, URINE: 1.015 (ref 1.005–1.030)
pH: 6.5 (ref 5.0–8.0)

## 2015-09-28 MED ORDER — TRAMADOL HCL 50 MG PO TABS: 50.0000 mg | ORAL_TABLET | Freq: Four times a day (QID) | ORAL | Status: DC | PRN

## 2015-09-28 MED ORDER — ONDANSETRON 4 MG PO TBDP
4.0000 mg | ORAL_TABLET | Freq: Once | ORAL | Status: AC
  Administered 2015-09-28: 4 mg via ORAL
  Filled 2015-09-28: qty 1

## 2015-09-28 MED ORDER — ONDANSETRON 4 MG PO TBDP: 4.0000 mg | ORAL_TABLET | Freq: Three times a day (TID) | ORAL | Status: DC | PRN

## 2015-09-28 MED ORDER — ACETAMINOPHEN 325 MG PO TABS
650.0000 mg | ORAL_TABLET | Freq: Once | ORAL | Status: AC
  Administered 2015-09-28: 650 mg via ORAL
  Filled 2015-09-28: qty 2

## 2015-09-28 MED ORDER — ONDANSETRON HCL 4 MG PO TABS: 4.0000 mg | ORAL_TABLET | Freq: Three times a day (TID) | ORAL | Status: DC | PRN

## 2015-09-28 NOTE — ED Provider Notes (Signed)
CSN: [id number]     Arrival date & time 09/28/15  1700 History  By signing my name below, I, [name], attest that this documentation has been prepared under the direction and in the presence of [name] Mulders, MD. Electronically Signed: Soijett [name], ED Scribe. 09/28/2015. 8:38 PM.   Chief Complaint  Patient presents with  . Back Pain  . Emesis      The history is provided by the patient. No language interpreter was used.    HPI Comments: [name] is a 81 y.o. female who presents to the Emergency Department complaining of lower back pain onset yesterday. Pt notes that she had nausea and mild diaphoresis while at work yesterday. Pt states that her lower back pain is worsened with movement. Pt denies any recent injury/trauma to her back. Pt reports that she had a back surgery in 2011 with hardware placed. She states that she is having associated symptoms of nausea. She states that she has not tried any medications for the relief for her symptoms. Pt denies abdominal pain, vomiting, diarrhea, CP, SOB, numbness, weakness, vision change, fever, chills, cough, rhinorrhea, sore throat, joint swelling, bleeding easily, confusion, HA, and any other symptoms. Pt reports that she had a back surgery in 2011 with hardware placed. Pt PCP is Dr. Sheila Oats in [place], [place].   Pt secondarily complains of having to clear her throat excessively due to excess phlegm. Pt denies any other symptoms.   Past Medical History  Diagnosis Date  . Essential hypertension, benign   . Mixed hyperlipidemia   . Depression   . Allergic rhinitis   . GERD (gastroesophageal reflux disease)   . Arthritis   . History of blood transfusion   . Left bundle branch block   . History of cardiac catheterization     Normal coronaries 2010  . Secondary cardiomyopathy (HCC)     LVEF 40-45%, likely nonischemic  . Anxiety   . Osteopenia   . Cataract     had eye surgery  . Memory loss    Past Surgical History   Procedure Laterality Date  . Cholecystectomy  1991  . Tonsillectomy    . Appendectomy    . Back surgery  07/21/2008  . Rotator cuff repair  2011    right  . Cataract extraction w/phaco  04/16/2011    Procedure: CATARACT EXTRACTION PHACO AND INTRAOCULAR LENS PLACEMENT (IOC);  Surgeon: Susa Simmonds;  Location: [facility];  Service: Ophthalmology;  Laterality: Left;  CDE=11.35  . Hammer toe surgery  11/22/11    [facility], [name]  . Cataract extraction w/phaco  01/28/2012    Procedure: CATARACT EXTRACTION PHACO AND INTRAOCULAR LENS PLACEMENT (IOC);  Surgeon: Susa Simmonds, MD;  Location: [facility];  Service: Ophthalmology;  Laterality: Right;  CDI:8.15  . Cysto with hydrodistension  05/29/2012    Procedure: CYSTOSCOPY/HYDRODISTENSION;  Surgeon: Martina Sinner, MD;  Location: [facility];  Service: Urology;  Laterality: N/A;  INSTILLATION OF MARCAINE AND PYRIDIUM   . Lumbar laminectomy/decompression microdiscectomy Left 06/06/2012    Procedure: LUMBAR LAMINECTOMY/DECOMPRESSION MICRODISCECTOMY 1 LEVEL;  Surgeon: Maeola Harman, MD;  Location: [facility];  Service: Neurosurgery;  Laterality: Left;  Left Lumbar two-three Laminectomy for resection of synovial cyst  . Total hip arthroplasty Right 03/13/2013    Procedure: TOTAL HIP ARTHROPLASTY;  Surgeon: Nestor Lewandowsky, MD;  Location: [facility];  Service: Orthopedics;  Laterality: Right;  . Eye surgery Bilateral     catar  . Abdominal  hysterectomy      complete  . Spine surgery      back suregry 2011  . Joint replacement      right hip replacement 2013   Family History  Problem Relation Age of Onset  . Colon cancer Neg Hx   . Anesthesia problems Neg Hx   . Hypotension Neg Hx   . Malignant hyperthermia Neg Hx   . Pseudochol deficiency Neg Hx   . Diabetes Other   . Allergies Other   . Diabetes Father   . Diabetes Brother   . Cancer Brother 79    colon  . Diabetes Brother   . Diabetes Brother   . Hypertension Sister   . Diabetes  Sister   . Diabetes Sister    Social History  Substance Use Topics  . Smoking status: Current Every Day Smoker -- 0.10 packs/day for 40 years    Types: Cigarettes  . Smokeless tobacco: Never Used     Comment: smokes 2 cigarettes a day  . Alcohol Use: No   OB History    No data available     Review of Systems  Constitutional: Positive for diaphoresis. Negative for fever and chills.  HENT: Negative for rhinorrhea and sore throat.   Eyes: Negative for visual disturbance.  Respiratory: Negative for cough and shortness of breath.   Cardiovascular: Negative for chest pain.  Gastrointestinal: Positive for nausea. Negative for vomiting, abdominal pain and diarrhea.  Genitourinary: Positive for dysuria.  Musculoskeletal: Positive for back pain. Negative for joint swelling.  Neurological: Negative for weakness, numbness and headaches.  Hematological: Does not bruise/bleed easily.  Psychiatric/Behavioral: Negative for confusion.      Allergies  Lisinopril-hydrochlorothiazide; Codeine; and Penicillins  Home Medications   Prior to Admission medications   Medication Sig Start Date End Date Taking? Authorizing Provider  amitriptyline (ELAVIL) 25 MG tablet Take 1 tablet (25 mg total) by mouth at bedtime. 12/10/14  Yes Mary-Margaret Daphine Deutscher, FNP  aspirin EC 81 MG tablet Take 81 mg by mouth daily.   Yes Historical Provider, MD  Biotin 5000 MCG CAPS Take 1 capsule by mouth daily.   Yes Historical Provider, MD  budesonide-formoterol (SYMBICORT) 160-4.5 MCG/ACT inhaler Inhale 2 puffs into the lungs 2 (two) times daily. 05/04/15  Yes [name], MD  buPROPion [facility] SR) 150 MG 12 hr tablet Take 1 tablet (150 mg total) by mouth 2 (two) times daily. 12/10/14  Yes Mary-Margaret Daphine Deutscher, FNP  calcium citrate-vitamin D (CITRACAL+D) 315-200 MG-UNIT per tablet Take 2 tablets by mouth daily.    Yes Historical Provider, MD  carvedilol (COREG) 6.25 MG tablet Take 1 tablet (6.25 mg total) by mouth 2  (two) times daily with a meal. 09/23/15  Yes Frederica Kuster, MD  cetirizine (ZYRTEC) 10 MG tablet Take 1 tablet (10 mg total) by mouth daily. 06/29/15  Yes Elenora Gamma, MD  citalopram (CELEXA) 10 MG tablet Take 1 tablet (10 mg total) by mouth daily. 06/16/15  Yes [name], MD  donepezil (ARICEPT) 10 MG tablet Take 1 tablet (10 mg total) by mouth at bedtime. 09/13/15  Yes Levert Feinstein, MD  esomeprazole (NEXIUM) 40 MG capsule Take 1 capsule (40 mg total) by mouth daily before breakfast. 09/13/15  Yes Elenora Gamma, MD  fluticasone (FLONASE) 50 MCG/ACT nasal spray Place 2 sprays into both nostrils daily. 02/09/15  Yes Elige Radon Dettinger, MD  furosemide (LASIX) 20 MG tablet Take 0.5 tablets (10 mg total) by mouth daily. 09/23/15  Yes Frederica Kuster, MD  losartan (COZAAR) 50 MG tablet TAKE 1 TABLET (50 MG TOTAL) BY MOUTH DAILY. Patient taking differently: Take 25 mg by mouth daily.  12/10/14  Yes Mary-Margaret Daphine Deutscher, FNP  memantine (NAMENDA) 10 MG tablet Take 1 tablet (10 mg total) by mouth 2 (two) times daily. 09/13/15  Yes Levert Feinstein, MD  raloxifene (EVISTA) 60 MG tablet Take 1 tablet (60 mg total) by mouth daily. 12/10/14  Yes Mary-Margaret Daphine Deutscher, FNP  simvastatin (ZOCOR) 10 MG tablet TAKE 1 TABLET (10 MG TOTAL) BY MOUTH AT BEDTIME. 12/10/14  Yes Mary-Margaret Daphine Deutscher, FNP  vitamin B-12 (CYANOCOBALAMIN) 1000 MCG tablet Take 0.5 tablets (500 mcg total) by mouth daily. 06/16/15  Yes [name], MD  albuterol (PROVENTIL HFA;VENTOLIN HFA) 108 (90 Base) MCG/ACT inhaler Inhale 2 puffs into the lungs every 6 (six) hours as needed for wheezing or shortness of breath. 09/23/15   Frederica Kuster, MD  HYDROcodone-acetaminophen (NORCO/VICODIN) 5-325 MG tablet Take 1 tablet by mouth every 6 (six) hours as needed for moderate pain or severe pain. 08/12/15   Fayrene Helper, PA-C  ibuprofen (ADVIL,MOTRIN) 800 MG tablet TAKE  ONE TABLET BY MOUTH EVERY 8 HOURS AS NEEDED Patient taking differently: TAKE  ONE TABLET  BY MOUTH EVERY 8 HOURS AS NEEDED PAIN 05/24/15   [name], MD  methocarbamol (ROBAXIN) 500 MG tablet Take 1 tablet (500 mg total) by mouth every 8 (eight) hours as needed for muscle spasms. 08/13/15   Fayrene Helper, PA-C  ondansetron (ZOFRAN) 4 MG tablet Take 1 tablet (4 mg total) by mouth 3 (three) times daily as needed for nausea or vomiting. 09/28/15   Frederica Kuster, MD  traMADol (ULTRAM) 50 MG tablet Take 1 tablet (50 mg total) by mouth every 6 (six) hours as needed. 09/28/15   [name] Mulders, MD  Vitamin D, Ergocalciferol, (DRISDOL) 50000 UNITS CAPS capsule Take 1 capsule (50,000 Units total) by mouth once a week. Patient taking differently: Take 50,000 Units by mouth every Monday.  12/10/14   Mary-Margaret Daphine Deutscher, FNP   BP 148/71 mmHg  Pulse 60  Temp(Src) 98.7 F (37.1 C) (Oral)  Resp 18  Wt 68.04 kg  SpO2 96% Physical Exam  Constitutional: She is oriented to person, place, and time. She appears well-developed and well-nourished. No distress.  HENT:  Head: Normocephalic and atraumatic.  Mouth/Throat: Mucous membranes are normal.  Eyes: Conjunctivae and EOM are normal. Pupils are equal, round, and reactive to light. No scleral icterus.  Neck: Neck supple.  Cardiovascular: Normal rate, regular rhythm and normal heart sounds.  Exam reveals no gallop and no friction rub.   No murmur heard. Pulmonary/Chest: Effort normal and breath sounds normal. No respiratory distress. She has no wheezes. She has no rales.  Abdominal: Soft. Bowel sounds are normal. There is no tenderness.  Musculoskeletal: Normal range of motion.  No swelling to ankles.   Neurological: She is alert and oriented to person, place, and time. No cranial nerve deficit. She exhibits normal muscle tone. Coordination normal.  Skin: Skin is warm and dry.  Psychiatric: She has a normal mood and affect. Her behavior is normal.  Nursing note and vitals reviewed.   ED Course  Procedures (including critical care  time) DIAGNOSTIC STUDIES: Oxygen Saturation is 96% on RA, nl by my interpretation.    COORDINATION OF CARE: 8:36 PM Discussed treatment plan with pt at bedside which includes labs, UA, lumbar spine xray and pt agreed to plan.    Labs  Review Labs Reviewed  COMPREHENSIVE METABOLIC PANEL - Abnormal; Notable for the following:    Creatinine, Ser 1.08 (*)    Calcium 8.7 (*)    Total Bilirubin 0.2 (*)    GFR calc non Af Amer 48 (*)    GFR calc Af Amer 55 (*)    All other components within normal limits  CBC WITH DIFFERENTIAL/PLATELET  URINALYSIS, ROUTINE W REFLEX MICROSCOPIC (NOT AT [facility])   Results for orders placed or performed during the hospital encounter of 09/28/15  CBC with Differential  Result Value Ref Range   WBC 8.3 4.0 - 10.5 K/uL   RBC 4.54 3.87 - 5.11 MIL/uL   Hemoglobin 13.6 12.0 - 15.0 g/dL   HCT 21.3 08.6 - 57.8 %   MCV 91.4 78.0 - 100.0 fL   MCH 30.0 26.0 - 34.0 pg   MCHC 32.8 30.0 - 36.0 g/dL   RDW 46.9 62.9 - 52.8 %   Platelets 302 150 - 400 K/uL   Neutrophils Relative % 60 %   Neutro Abs 5.0 1.7 - 7.7 K/uL   Lymphocytes Relative 35 %   Lymphs Abs 2.9 0.7 - 4.0 K/uL   Monocytes Relative 4 %   Monocytes Absolute 0.3 0.1 - 1.0 K/uL   Eosinophils Relative 1 %   Eosinophils Absolute 0.1 0.0 - 0.7 K/uL   Basophils Relative 0 %   Basophils Absolute 0.0 0.0 - 0.1 K/uL  Comprehensive metabolic panel  Result Value Ref Range   Sodium 142 135 - 145 mmol/L   Potassium 3.9 3.5 - 5.1 mmol/L   Chloride 111 101 - 111 mmol/L   CO2 26 22 - 32 mmol/L   Glucose, Bld 90 65 - 99 mg/dL   BUN 19 6 - 20 mg/dL   Creatinine, Ser 4.13 (H) 0.44 - 1.00 mg/dL   Calcium 8.7 (L) 8.9 - 10.3 mg/dL   Total Protein 6.7 6.5 - 8.1 g/dL   Albumin 3.7 3.5 - 5.0 g/dL   AST 22 15 - 41 U/L   ALT 19 14 - 54 U/L   Alkaline Phosphatase 93 38 - 126 U/L   Total Bilirubin 0.2 (L) 0.3 - 1.2 mg/dL   GFR calc non Af Amer 48 (L) >60 mL/min   GFR calc Af Amer 55 (L) >60 mL/min   Anion gap 5 5 - 15   Urinalysis, Routine w reflex microscopic (not at [facility])  Result Value Ref Range   Color, Urine YELLOW YELLOW   APPearance CLEAR CLEAR   Specific Gravity, Urine 1.015 1.005 - 1.030   pH 6.5 5.0 - 8.0   Glucose, UA NEGATIVE NEGATIVE mg/dL   Hgb urine dipstick NEGATIVE NEGATIVE   Bilirubin Urine NEGATIVE NEGATIVE   Ketones, ur NEGATIVE NEGATIVE mg/dL   Protein, ur NEGATIVE NEGATIVE mg/dL   Nitrite NEGATIVE NEGATIVE   Leukocytes, UA NEGATIVE NEGATIVE     Imaging Review Dg Lumbar Spine Complete  09/28/2015  CLINICAL DATA:  Medial low back pain with no injury for 1 day, pain does not radiate EXAM: LUMBAR SPINE - COMPLETE 4+ VIEW COMPARISON:  None. FINDINGS: Mild levoscoliosis lower lumbar spine. Normal anterior-posterior alignment. L4-5 posterior fixation with interbody fusion. Normal mild spondylosis T12-L1, L2-3, and L3-4. Moderate L5-S1 spondylosis. Calcification of the aortoiliac vessels. Right hip replacement noted. IMPRESSION: Mild scoliosis, degenerative, and postoperative change . Electronically Signed   By: Esperanza Heir M.D.   On: 09/28/2015 21:23   I have personally reviewed and evaluated these images and lab results as  part of my medical decision-making.   EKG Interpretation None      MDM   Final diagnoses:  Bilateral low back pain without sciatica     Patient with complaint of low back pain that's worse with movement. X-rays of the area without any acute findings. Will treat symptomatically. Patient's urinalysis negative for urinary tract infection. Other labs without significant abnormalities. No leukocytosis no anemia. Patient without any abdominal complaints at all. And also no evidence of any focal findings related to the back pain.     I personally performed the services described in this documentation, which was scribed in my presence. The recorded information has been reviewed and is accurate.      [name] Mulders, MD 09/28/15 2216

## 2015-09-28 NOTE — Discharge Instructions (Signed)
Symptoms seem to be consistent with of lumbar back pain. Without injury. X-rays negative for any bony findings. Will treat symptomatically. Take the tramadol for the pain as tolerated. Make an appointment to follow-up with her regular doctor. Rest of workup without any significant findings including urinalysis.

## 2015-09-28 NOTE — Addendum Note (Signed)
Addended by: Lorelee Cover C on: 09/28/2015 11:48 AM   Modules accepted: Orders

## 2015-09-28 NOTE — ED Notes (Signed)
Pt c/o lower back pain, n/v/d since yesterday.  Reports nausea today, no vomiting.  Denies any abd pain.

## 2015-09-28 NOTE — ED Notes (Signed)
Pt daughter [name] called from out of town and wanted to speak with her Mom. Phone given to pt, pt states, "It is ok for you to give my medical info. Out to my daughter, Mike Gip.  Informed Mike Gip that I was waiting on results to come back and Dr to go over them with her mother, then I would call her back with results.

## 2015-09-28 NOTE — ED Notes (Signed)
Assisted patient to the bedside commode and gave her call button to call out when she was done.

## 2015-09-28 NOTE — ED Notes (Signed)
Daughter that brought pt is now back in the room, explained pt is up for discharge and MD should be in shortly to discuss tests and scans performed. Explained she could call her sister, Mike Gip and tell her results.

## 2015-09-28 NOTE — Telephone Encounter (Signed)
Rx sent and patient aware 

## 2015-09-28 NOTE — ED Notes (Signed)
Removed bedside commode from room and done vitals on patient.

## 2015-09-28 NOTE — ED Notes (Signed)
MD at bedside. 

## 2015-09-30 ENCOUNTER — Encounter: Payer: Self-pay | Admitting: Family

## 2015-09-30 ENCOUNTER — Ambulatory Visit (INDEPENDENT_AMBULATORY_CARE_PROVIDER_SITE_OTHER): Payer: Medicare Other | Admitting: Family

## 2015-09-30 VITALS — BP 175/87 | HR 62 | Temp 97.6°F | Ht 64.0 in | Wt 159.0 lb

## 2015-09-30 DIAGNOSIS — N3001 Acute cystitis with hematuria: Secondary | ICD-10-CM

## 2015-09-30 DIAGNOSIS — R531 Weakness: Secondary | ICD-10-CM

## 2015-09-30 DIAGNOSIS — M545 Low back pain, unspecified: Secondary | ICD-10-CM

## 2015-09-30 DIAGNOSIS — R350 Frequency of micturition: Secondary | ICD-10-CM

## 2015-09-30 DIAGNOSIS — Z09 Encounter for follow-up examination after completed treatment for conditions other than malignant neoplasm: Secondary | ICD-10-CM

## 2015-09-30 MED ORDER — SULFAMETHOXAZOLE-TRIMETHOPRIM 800-160 MG PO TABS: 1.0000 | ORAL_TABLET | Freq: Two times a day (BID) | ORAL | Status: DC

## 2015-09-30 NOTE — Progress Notes (Signed)
   Subjective:    Patient ID: [name], female    DOB: Aug 31, 1934, 81 y.o.   MRN: [id number]  Pt presents to the office today for hospital follow up from low back pain, urine frequency, weakness, and nausea.  Back Pain This is a new problem. The current episode started in the past 7 days. The problem occurs constantly. The problem is unchanged. The pain is present in the lumbar spine. The quality of the pain is described as aching. The pain is at a severity of 7/10. The pain is moderate. Pertinent negatives include no dysuria, leg pain, paresis, perianal numbness, tingling or weakness. Risk factors include obesity. She has tried NSAIDs for the symptoms. The treatment provided mild relief.  Urinary Frequency  This is a new problem. The current episode started in the past 7 days. The problem occurs every urination. The problem has been unchanged. The pain is at a severity of 0/10. The patient is experiencing no pain. Associated symptoms include frequency, nausea and urgency. Pertinent negatives include no discharge, hematuria, hesitancy or vomiting. She has tried increased fluids for the symptoms. The treatment provided mild relief.   [facility] notes reviewed.   Review of Systems  Constitutional: Positive for fatigue.  Gastrointestinal: Positive for nausea. Negative for vomiting.  Genitourinary: Positive for urgency and frequency. Negative for dysuria, hesitancy and hematuria.  Musculoskeletal: Positive for back pain.  Neurological: Negative for tingling and weakness.       Objective:   Physical Exam  Constitutional: She is oriented to person, place, and time. She appears well-developed and well-nourished. She has a sickly appearance. She appears ill. No distress.  HENT:  Head: Normocephalic and atraumatic.  Eyes: Pupils are equal, round, and reactive to light.  Neck: Normal range of motion. Neck supple. No thyromegaly present.  Cardiovascular: Normal rate, regular rhythm, normal  heart sounds and intact distal pulses.   No murmur heard. Pulmonary/Chest: Effort normal and breath sounds normal. No respiratory distress. She has no wheezes.  Abdominal: Soft. Bowel sounds are normal. She exhibits no distension. There is tenderness (mild lower abd pain).  Musculoskeletal: Normal range of motion. She exhibits no edema or tenderness.  Negative CVA tenderness   Neurological: She is alert and oriented to person, place, and time.  Skin: Skin is warm and dry.  Psychiatric: She has a normal mood and affect. Her behavior is normal. Judgment and thought content normal.  Vitals reviewed.     BP 175/87 mmHg  Pulse 62  Temp(Src) 97.6 F (36.4 C) (Oral)  Ht 5\' 4"  (1.626 m)  Wt 159 lb (72.122 kg)  BMI 27.28 kg/m2     Assessment & Plan:  1. Urinary frequency - Urinalysis, Complete  2. Bilateral low back pain without sciatica -Continue ultram -Rest -Ice  3. Weakness  4. Hospital discharge follow-up  5. Acute cystitis with hematuria -Force fluids AZO over the counter X2 days RTO prn Culture pending - sulfamethoxazole-trimethoprim (BACTRIM DS) 800-160 MG tablet; Take 1 tablet by mouth 2 (two) times daily.  Dispense: 14 tablet; Refill: 0 - Urine culture  , FNP

## 2015-09-30 NOTE — Patient Instructions (Signed)

## 2015-10-04 ENCOUNTER — Observation Stay (HOSPITAL_COMMUNITY)
Admission: EM | Admit: 2015-10-04 | Discharge: 2015-10-07 | Disposition: A | Discharge status: Home / Self Care | Payer: Medicare Other | Attending: Internal Medicine | Admitting: Internal Medicine

## 2015-10-04 ENCOUNTER — Encounter (HOSPITAL_COMMUNITY): Payer: Self-pay

## 2015-10-04 ENCOUNTER — Emergency Department (HOSPITAL_COMMUNITY): Payer: Medicare Other

## 2015-10-04 DIAGNOSIS — Z880 Allergy status to penicillin: Secondary | ICD-10-CM | POA: Insufficient documentation

## 2015-10-04 DIAGNOSIS — K219 Gastro-esophageal reflux disease without esophagitis: Secondary | ICD-10-CM | POA: Diagnosis not present

## 2015-10-04 DIAGNOSIS — B9620 Unspecified Escherichia coli [E. coli] as the cause of diseases classified elsewhere: Secondary | ICD-10-CM | POA: Diagnosis not present

## 2015-10-04 DIAGNOSIS — E782 Mixed hyperlipidemia: Secondary | ICD-10-CM | POA: Insufficient documentation

## 2015-10-04 DIAGNOSIS — F17210 Nicotine dependence, cigarettes, uncomplicated: Secondary | ICD-10-CM | POA: Insufficient documentation

## 2015-10-04 DIAGNOSIS — F411 Generalized anxiety disorder: Secondary | ICD-10-CM | POA: Insufficient documentation

## 2015-10-04 DIAGNOSIS — R109 Unspecified abdominal pain: Secondary | ICD-10-CM | POA: Diagnosis not present

## 2015-10-04 DIAGNOSIS — I429 Cardiomyopathy, unspecified: Secondary | ICD-10-CM | POA: Diagnosis not present

## 2015-10-04 DIAGNOSIS — Z7982 Long term (current) use of aspirin: Secondary | ICD-10-CM | POA: Diagnosis not present

## 2015-10-04 DIAGNOSIS — E860 Dehydration: Secondary | ICD-10-CM | POA: Diagnosis not present

## 2015-10-04 DIAGNOSIS — I10 Essential (primary) hypertension: Secondary | ICD-10-CM | POA: Diagnosis not present

## 2015-10-04 DIAGNOSIS — R52 Pain, unspecified: Secondary | ICD-10-CM

## 2015-10-04 DIAGNOSIS — N390 Urinary tract infection, site not specified: Principal | ICD-10-CM | POA: Insufficient documentation

## 2015-10-04 DIAGNOSIS — Z96641 Presence of right artificial hip joint: Secondary | ICD-10-CM | POA: Diagnosis not present

## 2015-10-04 DIAGNOSIS — J449 Chronic obstructive pulmonary disease, unspecified: Secondary | ICD-10-CM | POA: Insufficient documentation

## 2015-10-04 DIAGNOSIS — M1990 Unspecified osteoarthritis, unspecified site: Secondary | ICD-10-CM | POA: Diagnosis not present

## 2015-10-04 DIAGNOSIS — M109 Gout, unspecified: Secondary | ICD-10-CM | POA: Diagnosis not present

## 2015-10-04 DIAGNOSIS — F0390 Unspecified dementia without behavioral disturbance: Secondary | ICD-10-CM | POA: Insufficient documentation

## 2015-10-04 DIAGNOSIS — F03A0 Unspecified dementia, mild, without behavioral disturbance, psychotic disturbance, mood disturbance, and anxiety: Secondary | ICD-10-CM | POA: Diagnosis present

## 2015-10-04 LAB — URINALYSIS, ROUTINE W REFLEX MICROSCOPIC
Glucose, UA: NEGATIVE mg/dL
Hgb urine dipstick: NEGATIVE
KETONES UR: 15 mg/dL — AB
NITRITE: NEGATIVE
Protein, ur: NEGATIVE mg/dL
Specific Gravity, Urine: 1.026 (ref 1.005–1.030)
pH: 5.5 (ref 5.0–8.0)

## 2015-10-04 LAB — CBC
HCT: 45 % (ref 36.0–46.0)
Hemoglobin: 15.2 g/dL — ABNORMAL HIGH (ref 12.0–15.0)
MCH: 29.5 pg (ref 26.0–34.0)
MCHC: 33.8 g/dL (ref 30.0–36.0)
MCV: 87.4 fL (ref 78.0–100.0)
PLATELETS: 310 10*3/uL (ref 150–400)
RBC: 5.15 MIL/uL — ABNORMAL HIGH (ref 3.87–5.11)
RDW: 14.5 % (ref 11.5–15.5)
WBC: 10.4 10*3/uL (ref 4.0–10.5)

## 2015-10-04 LAB — COMPREHENSIVE METABOLIC PANEL
ALBUMIN: 3.7 g/dL (ref 3.5–5.0)
ALK PHOS: 91 U/L (ref 38–126)
ALT: 23 U/L (ref 14–54)
ANION GAP: 11 (ref 5–15)
AST: 30 U/L (ref 15–41)
BILIRUBIN TOTAL: 0.4 mg/dL (ref 0.3–1.2)
BUN: 17 mg/dL (ref 6–20)
CALCIUM: 9.7 mg/dL (ref 8.9–10.3)
CO2: 22 mmol/L (ref 22–32)
CREATININE: 1.36 mg/dL — AB (ref 0.44–1.00)
Chloride: 104 mmol/L (ref 101–111)
GFR calc Af Amer: 42 mL/min — ABNORMAL LOW (ref >60)
GFR calc non Af Amer: 36 mL/min — ABNORMAL LOW (ref >60)
Glucose, Bld: 107 mg/dL — ABNORMAL HIGH (ref 65–99)
Potassium: 4.2 mmol/L (ref 3.5–5.1)
Sodium: 137 mmol/L (ref 135–145)
TOTAL PROTEIN: 6.8 g/dL (ref 6.5–8.1)

## 2015-10-04 LAB — URINE MICROSCOPIC-ADD ON

## 2015-10-04 LAB — LIPASE, BLOOD: Lipase: 23 U/L (ref 11–51)

## 2015-10-04 MED ORDER — SODIUM CHLORIDE 0.9 % IV BOLUS (SEPSIS)
500.0000 mL | Freq: Once | INTRAVENOUS | Status: AC
  Administered 2015-10-04: 500 mL via INTRAVENOUS

## 2015-10-04 MED ORDER — SODIUM CHLORIDE 0.9 % IV BOLUS (SEPSIS)
500.0000 mL | Freq: Once | INTRAVENOUS | Status: AC
  Administered 2015-10-05: 500 mL via INTRAVENOUS

## 2015-10-04 NOTE — ED Notes (Signed)
Unable to void at triage

## 2015-10-04 NOTE — ED Provider Notes (Signed)
CSN: [id number]     Arrival date & time 10/04/15  1708 History   First MD Initiated Contact with Patient 10/04/15 2057     Chief Complaint  Patient presents with  . Emesis  . Diarrhea     (Consider location/radiation/quality/duration/timing/severity/associated sxs/prior Treatment) HPI Comments: Pt presents with one week hx of worsening abdominal back pain. She states she's been having pain across her lower back. It is worse with movement. However she also has pain across her lower abdomen. She's had some diarrhea since last night. She's had about 8 or 9 episodes of diarrhea. She also has urinary frequency. She's had some nausea and vomiting today. She initially was seen at [facility] emergency department on May 31 and was diagnosed with musculoskeletal back pain. She followed up with her PCP who diagnosed a UTI and started her on Bactrim on June 2. Per chart review, her urine grew out Escherichia coli which is susceptible to Bactrim. She states that she has been taking her Bactrim until today when she threw it up. She denies any known fevers. She states she feels very fatigued and dehydrated.  Patient is a 81 y.o. female presenting with vomiting and diarrhea.  Emesis Associated symptoms: abdominal pain and diarrhea   Associated symptoms: no arthralgias, no chills and no headaches   Diarrhea Associated symptoms: abdominal pain and vomiting   Associated symptoms: no arthralgias, no chills, no diaphoresis, no fever and no headaches     Past Medical History  Diagnosis Date  . Essential hypertension, benign   . Mixed hyperlipidemia   . Depression   . Allergic rhinitis   . GERD (gastroesophageal reflux disease)   . Arthritis   . History of blood transfusion   . Left bundle branch block   . History of cardiac catheterization     Normal coronaries 2010  . Secondary cardiomyopathy (HCC)     LVEF 40-45%, likely nonischemic  . Anxiety   . Osteopenia   . Cataract     had eye surgery  .  Memory loss    Past Surgical History  Procedure Laterality Date  . Cholecystectomy  1991  . Tonsillectomy    . Appendectomy    . Back surgery  07/21/2008  . Rotator cuff repair  2011    right  . Cataract extraction w/phaco  04/16/2011    Procedure: CATARACT EXTRACTION PHACO AND INTRAOCULAR LENS PLACEMENT (IOC);  Surgeon: Susa Simmonds;  Location: [facility];  Service: Ophthalmology;  Laterality: Left;  CDE=11.35  . Hammer toe surgery  11/22/11    [facility], [name]  . Cataract extraction w/phaco  01/28/2012    Procedure: CATARACT EXTRACTION PHACO AND INTRAOCULAR LENS PLACEMENT (IOC);  Surgeon: Susa Simmonds, MD;  Location: [facility];  Service: Ophthalmology;  Laterality: Right;  CDI:8.15  . Cysto with hydrodistension  05/29/2012    Procedure: CYSTOSCOPY/HYDRODISTENSION;  Surgeon: Martina Sinner, MD;  Location: [facility];  Service: Urology;  Laterality: N/A;  INSTILLATION OF MARCAINE AND PYRIDIUM   . Lumbar laminectomy/decompression microdiscectomy Left 06/06/2012    Procedure: LUMBAR LAMINECTOMY/DECOMPRESSION MICRODISCECTOMY 1 LEVEL;  Surgeon: Maeola Harman, MD;  Location: [facility];  Service: Neurosurgery;  Laterality: Left;  Left Lumbar two-three Laminectomy for resection of synovial cyst  . Total hip arthroplasty Right 03/13/2013    Procedure: TOTAL HIP ARTHROPLASTY;  Surgeon: Nestor Lewandowsky, MD;  Location: [facility] OR;  Service: Orthopedics;  Laterality: Right;  . Eye surgery Bilateral     catar  .  Abdominal hysterectomy      complete  . Spine surgery      back suregry 2011  . Joint replacement      right hip replacement 2013   Family History  Problem Relation Age of Onset  . Colon cancer Neg Hx   . Anesthesia problems Neg Hx   . Hypotension Neg Hx   . Malignant hyperthermia Neg Hx   . Pseudochol deficiency Neg Hx   . Diabetes Other   . Allergies Other   . Diabetes Father   . Diabetes Brother   . Cancer Brother 21    colon  . Diabetes Brother   . Diabetes Brother    . Hypertension Sister   . Diabetes Sister   . Diabetes Sister    Social History  Substance Use Topics  . Smoking status: Current Every Day Smoker -- 0.10 packs/day for 40 years    Types: Cigarettes  . Smokeless tobacco: Never Used     Comment: smokes 2 cigarettes a day  . Alcohol Use: No   OB History    No data available     Review of Systems  Constitutional: Positive for appetite change and fatigue. Negative for fever, chills and diaphoresis.  HENT: Negative for congestion, rhinorrhea and sneezing.   Eyes: Negative.   Respiratory: Negative for cough, chest tightness and shortness of breath.   Cardiovascular: Negative for chest pain and leg swelling.  Gastrointestinal: Positive for nausea, vomiting, abdominal pain and diarrhea. Negative for blood in stool.  Genitourinary: Positive for frequency. Negative for hematuria, flank pain and difficulty urinating.  Musculoskeletal: Positive for back pain. Negative for arthralgias.  Skin: Negative for rash.  Neurological: Negative for dizziness, speech difficulty, weakness, numbness and headaches.      Allergies  Lisinopril-hydrochlorothiazide; Codeine; and Penicillins  Home Medications   Prior to Admission medications   Medication Sig Start Date End Date Taking? Authorizing Provider  albuterol (PROVENTIL HFA;VENTOLIN HFA) 108 (90 Base) MCG/ACT inhaler Inhale 2 puffs into the lungs every 6 (six) hours as needed for wheezing or shortness of breath. 09/23/15   Frederica Kuster, MD  amitriptyline (ELAVIL) 25 MG tablet Take 1 tablet (25 mg total) by mouth at bedtime. 12/10/14   Mary-Margaret Daphine Deutscher, FNP  aspirin EC 81 MG tablet Take 81 mg by mouth daily.    Historical Provider, MD  Biotin 5000 MCG CAPS Take 1 capsule by mouth daily.    Historical Provider, MD  budesonide-formoterol (SYMBICORT) 160-4.5 MCG/ACT inhaler Inhale 2 puffs into the lungs 2 (two) times daily. 05/04/15   [name], MD  buPROPion (WELLBUTRIN SR) 150 MG 12 hr  tablet Take 1 tablet (150 mg total) by mouth 2 (two) times daily. 12/10/14   Mary-Margaret Daphine Deutscher, FNP  calcium citrate-vitamin D (CITRACAL+D) 315-200 MG-UNIT per tablet Take 2 tablets by mouth daily.     Historical Provider, MD  carvedilol (COREG) 6.25 MG tablet Take 1 tablet (6.25 mg total) by mouth 2 (two) times daily with a meal. 09/23/15   Frederica Kuster, MD  cetirizine (ZYRTEC) 10 MG tablet Take 1 tablet (10 mg total) by mouth daily. 06/29/15   Elenora Gamma, MD  citalopram (CELEXA) 10 MG tablet Take 1 tablet (10 mg total) by mouth daily. 06/16/15   [name], MD  donepezil (ARICEPT) 10 MG tablet Take 1 tablet (10 mg total) by mouth at bedtime. 09/13/15   Levert Feinstein, MD  esomeprazole (NEXIUM) 40 MG capsule Take 1 capsule (40 mg total)  by mouth daily before breakfast. 09/13/15   Elenora Gamma, MD  fluticasone [facility]) 50 MCG/ACT nasal spray Place 2 sprays into both nostrils daily. 02/09/15   Elige Radon Dettinger, MD  furosemide (LASIX) 20 MG tablet Take 0.5 tablets (10 mg total) by mouth daily. 09/23/15   Frederica Kuster, MD  HYDROcodone-acetaminophen (NORCO/VICODIN) 5-325 MG tablet Take 1 tablet by mouth every 6 (six) hours as needed for moderate pain or severe pain. 08/12/15   Fayrene Helper, PA-C  ibuprofen (ADVIL,MOTRIN) 800 MG tablet TAKE  ONE TABLET BY MOUTH EVERY 8 HOURS AS NEEDED Patient taking differently: TAKE  ONE TABLET BY MOUTH EVERY 8 HOURS AS NEEDED PAIN 05/24/15   [name], MD  losartan (COZAAR) 50 MG tablet TAKE 1 TABLET (50 MG TOTAL) BY MOUTH DAILY. Patient taking differently: Take 25 mg by mouth daily.  12/10/14   Mary-Margaret Daphine Deutscher, FNP  memantine (NAMENDA) 10 MG tablet Take 1 tablet (10 mg total) by mouth 2 (two) times daily. 09/13/15   Levert Feinstein, MD  methocarbamol (ROBAXIN) 500 MG tablet Take 1 tablet (500 mg total) by mouth every 8 (eight) hours as needed for muscle spasms. 08/13/15   Fayrene Helper, PA-C  ondansetron (ZOFRAN ODT) 4 MG disintegrating tablet Take 1  tablet (4 mg total) by mouth every 8 (eight) hours as needed. 09/28/15   Vanetta Mulders, MD  ondansetron (ZOFRAN) 4 MG tablet Take 1 tablet (4 mg total) by mouth 3 (three) times daily as needed for nausea or vomiting. 09/28/15   Frederica Kuster, MD  raloxifene (EVISTA) 60 MG tablet Take 1 tablet (60 mg total) by mouth daily. 12/10/14   Mary-Margaret Daphine Deutscher, FNP  simvastatin (ZOCOR) 10 MG tablet TAKE 1 TABLET (10 MG TOTAL) BY MOUTH AT BEDTIME. 12/10/14   Mary-Margaret Daphine Deutscher, FNP  sulfamethoxazole-trimethoprim (BACTRIM DS) 800-160 MG tablet Take 1 tablet by mouth 2 (two) times daily. 09/30/15   Junie Spencer, FNP  traMADol (ULTRAM) 50 MG tablet Take 1 tablet (50 mg total) by mouth every 6 (six) hours as needed. 09/28/15   Vanetta Mulders, MD  vitamin B-12 (CYANOCOBALAMIN) 1000 MCG tablet Take 0.5 tablets (500 mcg total) by mouth daily. 06/16/15   [name], MD  Vitamin D, Ergocalciferol, (DRISDOL) 50000 UNITS CAPS capsule Take 1 capsule (50,000 Units total) by mouth once a week. Patient not taking: Reported on 09/30/2015 12/10/14   Mary-Margaret Daphine Deutscher, FNP   BP 148/72 mmHg  Pulse 69  Temp(Src) 98.5 F (36.9 C) (Oral)  Resp 16  SpO2 97% Physical Exam  Constitutional: She is oriented to person, place, and time. She appears well-developed and well-nourished.  HENT:  Head: Normocephalic and atraumatic.  Eyes: Pupils are equal, round, and reactive to light.  Neck: Normal range of motion. Neck supple.  Cardiovascular: Normal rate, regular rhythm and normal heart sounds.   Pulmonary/Chest: Effort normal and breath sounds normal. No respiratory distress. She has no wheezes. She has no rales. She exhibits no tenderness.  Abdominal: Soft. Bowel sounds are normal. There is tenderness (moderate TTP across lower abdomen). There is no rebound and no guarding.  Musculoskeletal: Normal range of motion. She exhibits no edema.  Positive tenderness in the lower back at the L4-L5 area and across the lower  lumbar region bilaterally.  Lymphadenopathy:    She has no cervical adenopathy.  Neurological: She is alert and oriented to person, place, and time.  Skin: Skin is warm and dry. No rash noted.  Psychiatric: She has a normal  mood and affect.    ED Course  Procedures (including critical care time) Labs Review Labs Reviewed  COMPREHENSIVE METABOLIC PANEL - Abnormal; Notable for the following:    Glucose, Bld 107 (*)    Creatinine, Ser 1.36 (*)    GFR calc non Af Amer 36 (*)    GFR calc Af Amer 42 (*)    All other components within normal limits  CBC - Abnormal; Notable for the following:    RBC 5.15 (*)    Hemoglobin 15.2 (*)    All other components within normal limits  URINALYSIS, ROUTINE W REFLEX MICROSCOPIC (NOT AT [facility]) - Abnormal; Notable for the following:    Bilirubin Urine SMALL (*)    Ketones, ur 15 (*)    Leukocytes, UA SMALL (*)    All other components within normal limits  URINE MICROSCOPIC-ADD ON - Abnormal; Notable for the following:    Squamous Epithelial / LPF 6-30 (*)    Bacteria, UA FEW (*)    Casts HYALINE CASTS (*)    All other components within normal limits  LIPASE, BLOOD    Imaging Review Ct Renal Stone Study  10/04/2015  CLINICAL DATA:  One week of abdominal and low back pain, initial encounter EXAM: CT ABDOMEN AND PELVIS WITHOUT CONTRAST TECHNIQUE: Multidetector CT imaging of the abdomen and pelvis was performed following the standard protocol without IV contrast. COMPARISON:  Prior plain film from 09/28/2015 FINDINGS: Lung bases show some mild scarring in the left base. No acute infiltrate or sizable effusion is noted. The gallbladder has been surgically removed. The liver, spleen, adrenal glands and pancreas are within normal limits. Kidneys are well visualized bilaterally. The left kidney is somewhat ptotic. The bladder is decompressed. No ureteral stones are identified. The appendix is not visualize consistent with a prior surgical history. Scattered  diverticular changes noted without evidence of diverticulitis. No other bowel abnormality is noted. Mild aortoiliac calcifications are seen without aneurysmal dilatation. Postsurgical changes in lower lumbar spine and right hip are noted. No acute bony abnormality is seen. IMPRESSION: Chronic changes as described without evidence of acute abnormality. Electronically Signed   By: Alcide Clever M.D.   On: 10/04/2015 22:15   I have personally reviewed and evaluated these images and lab results as part of my medical decision-making.   EKG Interpretation None      MDM   Final diagnoses:  Dehydration  UTI (lower urinary tract infection)    Patient presents with lower abdominal and back pain. This is been continuing and worsening over the last week. She was recently diagnosed with a UTI. She's been on Bactrim but wasn't able to keep it down today. She's had nausea, vomiting and diarrhea today. Her CT scan doesn't show any acute abnormalities. She was given IV fluids and felt a little bit better but only tried to ambulate her, she wasn't able to ambulate on her own without significant unsteadiness. She has a mild elevation in her creatinine. I consulted Dr. Onalee Hua who will admit the patient for observation    Rolan Bucco, MD 10/05/15 [phone]

## 2015-10-04 NOTE — ED Notes (Signed)
Patient here with 1 week of lower abdominal and back pain with vomiting and diarrhea. Has seen her primary MD and [facility] ED with diagnosis of UTI. Taking meds as prescribed. Alert and oriented. Complains of weakness

## 2015-10-05 DIAGNOSIS — F0390 Unspecified dementia without behavioral disturbance: Secondary | ICD-10-CM

## 2015-10-05 DIAGNOSIS — E860 Dehydration: Secondary | ICD-10-CM | POA: Diagnosis present

## 2015-10-05 DIAGNOSIS — N390 Urinary tract infection, site not specified: Secondary | ICD-10-CM | POA: Diagnosis not present

## 2015-10-05 LAB — CBC
HEMATOCRIT: 41.7 % (ref 36.0–46.0)
Hemoglobin: 13.8 g/dL (ref 12.0–15.0)
MCH: 29.6 pg (ref 26.0–34.0)
MCHC: 33.1 g/dL (ref 30.0–36.0)
MCV: 89.5 fL (ref 78.0–100.0)
PLATELETS: 305 10*3/uL (ref 150–400)
RBC: 4.66 MIL/uL (ref 3.87–5.11)
RDW: 14.9 % (ref 11.5–15.5)
WBC: 9.5 10*3/uL (ref 4.0–10.5)

## 2015-10-05 LAB — BASIC METABOLIC PANEL
Anion gap: 9 (ref 5–15)
BUN: 15 mg/dL (ref 6–20)
CHLORIDE: 104 mmol/L (ref 101–111)
CO2: 26 mmol/L (ref 22–32)
CREATININE: 1.29 mg/dL — AB (ref 0.44–1.00)
Calcium: 8.8 mg/dL — ABNORMAL LOW (ref 8.9–10.3)
GFR, EST AFRICAN AMERICAN: 45 mL/min — AB (ref >60)
GFR, EST NON AFRICAN AMERICAN: 39 mL/min — AB (ref >60)
Glucose, Bld: 95 mg/dL (ref 65–99)
POTASSIUM: 3.8 mmol/L (ref 3.5–5.1)
SODIUM: 139 mmol/L (ref 135–145)

## 2015-10-05 MED ORDER — CIPROFLOXACIN IN D5W 400 MG/200ML IV SOLN
400.0000 mg | Freq: Once | INTRAVENOUS | Status: AC
  Administered 2015-10-05: 400 mg via INTRAVENOUS
  Filled 2015-10-05: qty 200

## 2015-10-05 MED ORDER — AMITRIPTYLINE HCL 25 MG PO TABS
25.0000 mg | ORAL_TABLET | Freq: Every day | ORAL | Status: DC
  Administered 2015-10-05 – 2015-10-06 (×2): 25 mg via ORAL
  Filled 2015-10-05 (×2): qty 1

## 2015-10-05 MED ORDER — PANTOPRAZOLE SODIUM 40 MG PO TBEC
40.0000 mg | DELAYED_RELEASE_TABLET | Freq: Every day | ORAL | Status: DC
  Administered 2015-10-05 – 2015-10-07 (×3): 40 mg via ORAL
  Filled 2015-10-05 (×3): qty 1

## 2015-10-05 MED ORDER — ONDANSETRON HCL 4 MG/2ML IJ SOLN: 4.0000 mg | Freq: Four times a day (QID) | INTRAMUSCULAR | Status: DC | PRN

## 2015-10-05 MED ORDER — RALOXIFENE HCL 60 MG PO TABS
60.0000 mg | ORAL_TABLET | Freq: Every day | ORAL | Status: DC
  Administered 2015-10-05 – 2015-10-07 (×3): 60 mg via ORAL
  Filled 2015-10-05 (×3): qty 1

## 2015-10-05 MED ORDER — CARVEDILOL 6.25 MG PO TABS
6.2500 mg | ORAL_TABLET | Freq: Two times a day (BID) | ORAL | Status: DC
  Administered 2015-10-05 – 2015-10-07 (×5): 6.25 mg via ORAL
  Filled 2015-10-05 (×5): qty 1

## 2015-10-05 MED ORDER — CITALOPRAM HYDROBROMIDE 10 MG PO TABS
10.0000 mg | ORAL_TABLET | Freq: Every day | ORAL | Status: DC
  Administered 2015-10-05 – 2015-10-07 (×3): 10 mg via ORAL
  Filled 2015-10-05 (×3): qty 1

## 2015-10-05 MED ORDER — SODIUM CHLORIDE 0.9 % IV SOLN
INTRAVENOUS | Status: DC
  Administered 2015-10-06 – 2015-10-07 (×3): via INTRAVENOUS

## 2015-10-05 MED ORDER — MEMANTINE HCL 10 MG PO TABS
10.0000 mg | ORAL_TABLET | Freq: Two times a day (BID) | ORAL | Status: DC
  Administered 2015-10-05 – 2015-10-07 (×4): 10 mg via ORAL
  Filled 2015-10-05 (×5): qty 1

## 2015-10-05 MED ORDER — BIOTIN 5000 MCG PO CAPS: 1.0000 | ORAL_CAPSULE | Freq: Every day | ORAL | Status: DC

## 2015-10-05 MED ORDER — FLUTICASONE PROPIONATE 50 MCG/ACT NA SUSP
2.0000 | Freq: Every day | NASAL | Status: DC
  Administered 2015-10-06 – 2015-10-07 (×2): 2 via NASAL
  Filled 2015-10-05: qty 16

## 2015-10-05 MED ORDER — ONDANSETRON HCL 4 MG PO TABS: 4.0000 mg | ORAL_TABLET | Freq: Four times a day (QID) | ORAL | Status: DC | PRN

## 2015-10-05 MED ORDER — CALCIUM CARBONATE-VITAMIN D 500-200 MG-UNIT PO TABS
2.0000 | ORAL_TABLET | Freq: Every day | ORAL | Status: DC
  Administered 2015-10-05 – 2015-10-07 (×3): 2 via ORAL
  Filled 2015-10-05 (×3): qty 2

## 2015-10-05 MED ORDER — BUPROPION HCL ER (SR) 150 MG PO TB12
150.0000 mg | ORAL_TABLET | Freq: Two times a day (BID) | ORAL | Status: DC
  Administered 2015-10-05 – 2015-10-07 (×4): 150 mg via ORAL
  Filled 2015-10-05 (×4): qty 1

## 2015-10-05 MED ORDER — MOMETASONE FURO-FORMOTEROL FUM 200-5 MCG/ACT IN AERO
2.0000 | INHALATION_SPRAY | Freq: Two times a day (BID) | RESPIRATORY_TRACT | Status: DC
  Administered 2015-10-05 – 2015-10-06 (×3): 2 via RESPIRATORY_TRACT
  Filled 2015-10-05: qty 8.8

## 2015-10-05 MED ORDER — ASPIRIN EC 81 MG PO TBEC
81.0000 mg | DELAYED_RELEASE_TABLET | Freq: Every day | ORAL | Status: DC
  Administered 2015-10-05 – 2015-10-07 (×3): 81 mg via ORAL
  Filled 2015-10-05 (×3): qty 1

## 2015-10-05 MED ORDER — SIMVASTATIN 5 MG PO TABS
10.0000 mg | ORAL_TABLET | Freq: Every day | ORAL | Status: DC
  Administered 2015-10-05 – 2015-10-07 (×3): 10 mg via ORAL
  Filled 2015-10-05 (×3): qty 2

## 2015-10-05 MED ORDER — HEPARIN SODIUM (PORCINE) 5000 UNIT/ML IJ SOLN
5000.0000 [IU] | Freq: Three times a day (TID) | INTRAMUSCULAR | Status: DC
  Administered 2015-10-05 – 2015-10-07 (×6): 5000 [IU] via SUBCUTANEOUS
  Filled 2015-10-05 (×7): qty 1

## 2015-10-05 MED ORDER — DONEPEZIL HCL 10 MG PO TABS
10.0000 mg | ORAL_TABLET | Freq: Every day | ORAL | Status: DC
  Administered 2015-10-05 – 2015-10-06 (×2): 10 mg via ORAL
  Filled 2015-10-05 (×2): qty 1

## 2015-10-05 MED ORDER — ALBUTEROL SULFATE (2.5 MG/3ML) 0.083% IN NEBU: 3.0000 mL | INHALATION_SOLUTION | Freq: Four times a day (QID) | RESPIRATORY_TRACT | Status: DC | PRN

## 2015-10-05 MED ORDER — DEXTROSE 5 % IV SOLN
1.0000 g | INTRAVENOUS | Status: DC
  Administered 2015-10-05 – 2015-10-07 (×3): 1 g via INTRAVENOUS
  Filled 2015-10-05 (×4): qty 10

## 2015-10-05 NOTE — Progress Notes (Signed)
Dr please call daughter tomorrow a.m. [phone] or [phone]

## 2015-10-05 NOTE — Progress Notes (Signed)
PROGRESS NOTE                                                                                                                                                                                                             Patient Demographics:    [name], is a 81 y.o. female, DOB - 07/13/1934, IBB:[id number]  Admit date - 10/04/2015   Admitting Physician Haydee Monica, MD  Outpatient Primary MD for the patient is [name], MD  LOS -   Chief Complaint  Patient presents with  . Emesis  . Diarrhea       Brief Narrative   81 yo female h/o dementia diagnosed with uti placed on bactrim 3 days ago having dysuria, urinary urgency and incontinence comes in today because she vomited once and threw up her bactrim   Subjective:    Baxter Hire today has, No headache, No chest pain, No abdominal pain - No Further nausea or vomiting since admission, but reports poor appetite .    Assessment  & Plan :    Principal Problem:   UTI (lower urinary tract infection) Active Problems:   Secondary cardiomyopathy (HCC)   GAD (generalized anxiety disorder)   COPD (chronic obstructive pulmonary disease) (HCC)   Dementia   Dehydration  UTI - Injected with Bactrim as an outpatient, finish total of 3 days, but she did vomit last dose, urine culture growing Escherichia coli, sensitive to Rocephin, will continue with Rocephin, continue IV fluids.  Hypertension - Blood pressure acceptable, continue with home medication, to neutral hold Cozaar given her renal function  Hyperlipidemia - Continue statin  COPD - No active wheezing, continue with home medication  Dehydration - Continue with IV fluids   Code Status : Full  Family Communication  : Cast with a granddaughter Courtney via phone, tried to call daughter [name] but no answer  Disposition Plan  : Home in 1-2 days  Consults  :  None  Procedures  : None  DVT Prophylaxis  :  Heparin   Lab  Results  Component Value Date   PLT 305 10/05/2015    Antibiotics  :    Anti-infectives    Start     Dose/Rate Route Frequency Ordered Stop   10/05/15 0200  cefTRIAXone (ROCEPHIN) 1 g in dextrose 5 % 50 mL IVPB  1 g 100 mL/hr over 30 Minutes Intravenous Every 24 hours 10/05/15 0111     10/05/15 0015  ciprofloxacin (CIPRO) IVPB 400 mg     400 mg 200 mL/hr over 60 Minutes Intravenous  Once 10/05/15 0010 10/05/15 0133        Objective:   Filed Vitals:   10/05/15 0550 10/05/15 0746 10/05/15 0908 10/05/15 1345  BP: 125/58 136/63 140/51 117/53  Pulse: 58  59 63  Temp: 98.1 F (36.7 C)  97.5 F (36.4 C) 98.1 F (36.7 C)  TempSrc: Oral  Oral Oral  Resp: 18  18 18   Height:      Weight:      SpO2: 95%  98% 97%    Wt Readings from Last 3 Encounters:  10/05/15 72.6 kg (160 lb 0.9 oz)  09/30/15 72.122 kg (159 lb)  09/28/15 68.04 kg (150 lb)     Intake/Output Summary (Last 24 hours) at 10/05/15 1549 Last data filed at 10/05/15 0548  Gross per 24 hour  Intake    480 ml  Output    700 ml  Net   -220 ml     Physical Exam  Awake Alert, Supple Neck,No JVD Symmetrical Chest wall movement, Good air movement bilaterally, CTAB RRR,No Gallops,Rubs or new Murmurs, No Parasternal Heave +ve B.Sounds, Abd Soft, No tenderness, No rebound - guarding or rigidity. No Cyanosis, Clubbing or edema, No new Rash or bruise      Data Review:    CBC  Recent Labs Lab 09/28/15 1958 10/04/15 1724 10/05/15 0228  WBC 8.3 10.4 9.5  HGB 13.6 15.2* 13.8  HCT 41.5 45.0 41.7  PLT 302 310 305  MCV 91.4 87.4 89.5  MCH 30.0 29.5 29.6  MCHC 32.8 33.8 33.1  RDW 15.1 14.5 14.9  LYMPHSABS 2.9  --   --   MONOABS 0.3  --   --   EOSABS 0.1  --   --   BASOSABS 0.0  --   --     Chemistries   Recent Labs Lab 09/28/15 1958 10/04/15 1724 10/05/15 0228  NA 142 137 139  K 3.9 4.2 3.8  CL 111 104 104  CO2 26 22 26   GLUCOSE 90 107* 95  BUN 19 17 15   CREATININE 1.08* 1.36* 1.29*    CALCIUM 8.7* 9.7 8.8*  AST 22 30  --   ALT 19 23  --   ALKPHOS 93 91  --   BILITOT 0.2* 0.4  --    ------------------------------------------------------------------------------------------------------------------ No results for input(s): CHOL, HDL, LDLCALC, TRIG, CHOLHDL, LDLDIRECT in the last 72 hours.  Lab Results  Component Value Date   HGBA1C 5.8* 10/04/2013   ------------------------------------------------------------------------------------------------------------------ No results for input(s): TSH, T4TOTAL, T3FREE, THYROIDAB in the last 72 hours.  Invalid input(s): FREET3 ------------------------------------------------------------------------------------------------------------------ No results for input(s): VITAMINB12, FOLATE, FERRITIN, TIBC, IRON, RETICCTPCT in the last 72 hours.  Coagulation profile No results for input(s): INR, PROTIME in the last 168 hours.  No results for input(s): DDIMER in the last 72 hours.  Cardiac Enzymes No results for input(s): CKMB, TROPONINI, MYOGLOBIN in the last 168 hours.  Invalid input(s): CK ------------------------------------------------------------------------------------------------------------------    Component Value Date/Time   BNP 166.2* 02/21/2015 1833    Inpatient Medications  Scheduled Meds: . cefTRIAXone (ROCEPHIN)  IV  1 g Intravenous Q24H   Continuous Infusions: . sodium chloride     PRN Meds:.albuterol, ondansetron **OR** ondansetron (ZOFRAN) IV  Micro Results Recent Results (from the past 240 hour(s))  Microscopic Examination  Status: None   Collection Time: 09/30/15  2:34 PM  Result Value Ref Range Status   WBC, UA 0-5 0 -  5 /hpf Final   RBC, UA 0-2 0 -  2 /hpf Final   Epithelial Cells (non renal) 0-10 0 - 10 /hpf Final   Bacteria, UA None seen None seen/Few Final  Urine culture     Status: Abnormal   Collection Time: 09/30/15  3:07 PM  Result Value Ref Range Status   Urine Culture, Routine  Final report (A)  Final   Urine Culture result 1 Escherichia coli (A)  Final    Comment: 25,000-50,000 colony forming units per mL   ANTIMICROBIAL SUSCEPTIBILITY Comment  Final    Comment:       ** S = Susceptible; I = Intermediate; R = Resistant **                    P = Positive; N = Negative             MICS are expressed in micrograms per mL    Antibiotic                 RSLT#1    RSLT#2    RSLT#3    RSLT#4 Amoxicillin/Clavulanic Acid    R Ampicillin                     R Cefazolin                      R Cefepime                       S Ceftriaxone                    S Cefuroxime                     I Cephalothin                    R Ciprofloxacin                  S Ertapenem                      S Gentamicin                     S Imipenem                       S Levofloxacin                   S Nitrofurantoin                 S Piperacillin                   R Tetracycline                   R Tobramycin                     S Trimethoprim/Sulfa             S     Radiology Reports Dg Lumbar Spine Complete  09/28/2015  CLINICAL DATA:  Medial low back pain with no injury for 1 day, pain does not radiate EXAM: LUMBAR SPINE - COMPLETE 4+ VIEW COMPARISON:  None.  FINDINGS: Mild levoscoliosis lower lumbar spine. Normal anterior-posterior alignment. L4-5 posterior fixation with interbody fusion. Normal mild spondylosis T12-L1, L2-3, and L3-4. Moderate L5-S1 spondylosis. Calcification of the aortoiliac vessels. Right hip replacement noted. IMPRESSION: Mild scoliosis, degenerative, and postoperative change . Electronically Signed   By: Esperanza Heir M.D.   On: 09/28/2015 21:23   Ct Renal Stone Study  10/04/2015  CLINICAL DATA:  One week of abdominal and low back pain, initial encounter EXAM: CT ABDOMEN AND PELVIS WITHOUT CONTRAST TECHNIQUE: Multidetector CT imaging of the abdomen and pelvis was performed following the standard protocol without IV contrast. COMPARISON:  Prior plain film from  09/28/2015 FINDINGS: Lung bases show some mild scarring in the left base. No acute infiltrate or sizable effusion is noted. The gallbladder has been surgically removed. The liver, spleen, adrenal glands and pancreas are within normal limits. Kidneys are well visualized bilaterally. The left kidney is somewhat ptotic. The bladder is decompressed. No ureteral stones are identified. The appendix is not visualize consistent with a prior surgical history. Scattered diverticular changes noted without evidence of diverticulitis. No other bowel abnormality is noted. Mild aortoiliac calcifications are seen without aneurysmal dilatation. Postsurgical changes in lower lumbar spine and right hip are noted. No acute bony abnormality is seen. IMPRESSION: Chronic changes as described without evidence of acute abnormality. Electronically Signed   By: Alcide Clever M.D.   On: 10/04/2015 22:15    Time Spent in minutes  25 minutes   Ginnifer Creelman M.D on 10/05/2015 at 3:49 PM  Between 7am to 7pm - Pager - [phone]  After 7pm go to [email or web address] - password [email or web address]  [facility] -  Office  [phone]

## 2015-10-05 NOTE — Progress Notes (Signed)
Admitted to 5M09 via ED stretcher.  Walked with 1 assist to bed, gait steady.  Denies pain, slight nausea.  IV infusing per order.  Patient instructed on safety precautions, bed alarm on, reviewed orders & plan.

## 2015-10-05 NOTE — H&P (Signed)
PCP:   [name], MD   Chief Complaint:  N/v, uti  HPI: 81 yo female h/o dementia diagnosed with uti placed on bactrim 3 days ago having dysuria, urinary urgency and incontinence comes in today because she vomited once and threw up her bactrim.  She is not improving as expected.  No fevers.  Still some suprapubic discomfort.  No diarrhea.  uc from PCP shows ecoli sensitive to bactrim, rocephin and cipro.  Pt referred for admission for not feeling well and a cr of 1.3, given 500cc ivf in the ED.  Review of Systems:  Positive and negative as per HPI otherwise all other systems are negative  Past Medical History: Past Medical History  Diagnosis Date  . Essential hypertension, benign   . Mixed hyperlipidemia   . Depression   . Allergic rhinitis   . GERD (gastroesophageal reflux disease)   . Arthritis   . History of blood transfusion   . Left bundle branch block   . History of cardiac catheterization     Normal coronaries 2010  . Secondary cardiomyopathy (HCC)     LVEF 40-45%, likely nonischemic  . Anxiety   . Osteopenia   . Cataract     had eye surgery  . Memory loss    Past Surgical History  Procedure Laterality Date  . Cholecystectomy  1991  . Tonsillectomy    . Appendectomy    . Back surgery  07/21/2008  . Rotator cuff repair  2011    right  . Cataract extraction w/phaco  04/16/2011    Procedure: CATARACT EXTRACTION PHACO AND INTRAOCULAR LENS PLACEMENT (IOC);  Surgeon: Susa Simmonds;  Location: [facility];  Service: Ophthalmology;  Laterality: Left;  CDE=11.35  . Hammer toe surgery  11/22/11    [facility], [name]  . Cataract extraction w/phaco  01/28/2012    Procedure: CATARACT EXTRACTION PHACO AND INTRAOCULAR LENS PLACEMENT (IOC);  Surgeon: Susa Simmonds, MD;  Location: [facility];  Service: Ophthalmology;  Laterality: Right;  CDI:8.15  . Cysto with hydrodistension  05/29/2012    Procedure: CYSTOSCOPY/HYDRODISTENSION;  Surgeon: Martina Sinner, MD;  Location: [facility];  Service: Urology;  Laterality: N/A;  INSTILLATION OF MARCAINE AND PYRIDIUM   . Lumbar laminectomy/decompression microdiscectomy Left 06/06/2012    Procedure: LUMBAR LAMINECTOMY/DECOMPRESSION MICRODISCECTOMY 1 LEVEL;  Surgeon: Maeola Harman, MD;  Location: [facility];  Service: Neurosurgery;  Laterality: Left;  Left Lumbar two-three Laminectomy for resection of synovial cyst  . Total hip arthroplasty Right 03/13/2013    Procedure: TOTAL HIP ARTHROPLASTY;  Surgeon: Nestor Lewandowsky, MD;  Location: [facility];  Service: Orthopedics;  Laterality: Right;  . Eye surgery Bilateral     catar  . Abdominal hysterectomy      complete  . Spine surgery      back suregry 2011  . Joint replacement      right hip replacement 2013    Medications: Prior to Admission medications   Medication Sig Start Date End Date Taking? Authorizing Provider  albuterol (PROVENTIL HFA;VENTOLIN HFA) 108 (90 Base) MCG/ACT inhaler Inhale 2 puffs into the lungs every 6 (six) hours as needed for wheezing or shortness of breath. 09/23/15   Frederica Kuster, MD  amitriptyline (ELAVIL) 25 MG tablet Take 1 tablet (25 mg total) by mouth at bedtime. 12/10/14   Mary-Margaret Daphine Deutscher, FNP  aspirin EC 81 MG tablet Take 81 mg by mouth daily.    Historical Provider, MD  Biotin 5000 MCG CAPS Take  1 capsule by mouth daily.    Historical Provider, MD  budesonide-formoterol (SYMBICORT) 160-4.5 MCG/ACT inhaler Inhale 2 puffs into the lungs 2 (two) times daily. 05/04/15   [name], MD  buPROPion (WELLBUTRIN SR) 150 MG 12 hr tablet Take 1 tablet (150 mg total) by mouth 2 (two) times daily. 12/10/14   Mary-Margaret Daphine Deutscher, FNP  calcium citrate-vitamin D (CITRACAL+D) 315-200 MG-UNIT per tablet Take 2 tablets by mouth daily.     Historical Provider, MD  carvedilol (COREG) 6.25 MG tablet Take 1 tablet (6.25 mg total) by mouth 2 (two) times daily with a meal. 09/23/15   Frederica Kuster, MD  cetirizine (ZYRTEC) 10 MG tablet Take 1 tablet (10 mg  total) by mouth daily. 06/29/15   Elenora Gamma, MD  citalopram (CELEXA) 10 MG tablet Take 1 tablet (10 mg total) by mouth daily. 06/16/15   [name], MD  donepezil (ARICEPT) 10 MG tablet Take 1 tablet (10 mg total) by mouth at bedtime. 09/13/15   Levert Feinstein, MD  esomeprazole (NEXIUM) 40 MG capsule Take 1 capsule (40 mg total) by mouth daily before breakfast. 09/13/15   Elenora Gamma, MD  fluticasone [facility]) 50 MCG/ACT nasal spray Place 2 sprays into both nostrils daily. 02/09/15   Elige Radon Dettinger, MD  furosemide (LASIX) 20 MG tablet Take 0.5 tablets (10 mg total) by mouth daily. 09/23/15   Frederica Kuster, MD  HYDROcodone-acetaminophen (NORCO/VICODIN) 5-325 MG tablet Take 1 tablet by mouth every 6 (six) hours as needed for moderate pain or severe pain. 08/12/15   Fayrene Helper, PA-C  ibuprofen (ADVIL,MOTRIN) 800 MG tablet TAKE  ONE TABLET BY MOUTH EVERY 8 HOURS AS NEEDED Patient taking differently: TAKE  ONE TABLET BY MOUTH EVERY 8 HOURS AS NEEDED PAIN 05/24/15   [name], MD  losartan (COZAAR) 50 MG tablet TAKE 1 TABLET (50 MG TOTAL) BY MOUTH DAILY. Patient taking differently: Take 25 mg by mouth daily.  12/10/14   Mary-Margaret Daphine Deutscher, FNP  memantine (NAMENDA) 10 MG tablet Take 1 tablet (10 mg total) by mouth 2 (two) times daily. 09/13/15   Levert Feinstein, MD  methocarbamol (ROBAXIN) 500 MG tablet Take 1 tablet (500 mg total) by mouth every 8 (eight) hours as needed for muscle spasms. 08/13/15   Fayrene Helper, PA-C  ondansetron (ZOFRAN ODT) 4 MG disintegrating tablet Take 1 tablet (4 mg total) by mouth every 8 (eight) hours as needed. 09/28/15   Vanetta Mulders, MD  ondansetron (ZOFRAN) 4 MG tablet Take 1 tablet (4 mg total) by mouth 3 (three) times daily as needed for nausea or vomiting. 09/28/15   Frederica Kuster, MD  raloxifene (EVISTA) 60 MG tablet Take 1 tablet (60 mg total) by mouth daily. 12/10/14   Mary-Margaret Daphine Deutscher, FNP  simvastatin (ZOCOR) 10 MG tablet TAKE 1 TABLET (10 MG TOTAL)  BY MOUTH AT BEDTIME. 12/10/14   Mary-Margaret Daphine Deutscher, FNP  sulfamethoxazole-trimethoprim (BACTRIM DS) 800-160 MG tablet Take 1 tablet by mouth 2 (two) times daily. 09/30/15   Junie Spencer, FNP  traMADol (ULTRAM) 50 MG tablet Take 1 tablet (50 mg total) by mouth every 6 (six) hours as needed. 09/28/15   Vanetta Mulders, MD  vitamin B-12 (CYANOCOBALAMIN) 1000 MCG tablet Take 0.5 tablets (500 mcg total) by mouth daily. 06/16/15   [name], MD  Vitamin D, Ergocalciferol, (DRISDOL) 50000 UNITS CAPS capsule Take 1 capsule (50,000 Units total) by mouth once a week. Patient not taking: Reported on 09/30/2015 12/10/14  Mary-Margaret Daphine Deutscher, FNP    Allergies:   Allergies  Allergen Reactions  . Lisinopril-Hydrochlorothiazide Other (See Comments)    Lip swelling and cough. Stopped by ENT. Patient is on Losartan without any issues   . Codeine Itching and Swelling    eye irritation (prescribed percocet)  . Penicillins Itching and Swelling    Has patient had a PCN reaction causing immediate rash, facial/tongue/throat swelling, SOB or lightheadedness with hypotension: Yes Has patient had a PCN reaction causing severe rash involving mucus membranes or skin necrosis: No Has patient had a PCN reaction that required hospitalization No Has patient had a PCN reaction occurring within the last 10 years: No If all of the above answers are "NO", then may proceed with Cephalosporin use. eye irritation    Social History:  reports that she has been smoking Cigarettes.  She has a 4 pack-year smoking history. She has never used smokeless tobacco. She reports that she does not drink alcohol or use illicit drugs.  Family History: Family History  Problem Relation Age of Onset  . Colon cancer Neg Hx   . Anesthesia problems Neg Hx   . Hypotension Neg Hx   . Malignant hyperthermia Neg Hx   . Pseudochol deficiency Neg Hx   . Diabetes Other   . Allergies Other   . Diabetes Father   . Diabetes Brother   . Cancer  Brother 46    colon  . Diabetes Brother   . Diabetes Brother   . Hypertension Sister   . Diabetes Sister   . Diabetes Sister     Physical Exam: Filed Vitals:   10/04/15 2130 10/04/15 2134 10/04/15 2145 10/05/15 0030  BP: 146/66 146/66 148/72 126/75  Pulse: 56 68 69 75  Temp:      TempSrc:      Resp: 15 19 16 16   SpO2: 98% 97% 97% 97%   General appearance: alert, cooperative and no distress Head: Normocephalic, without obvious abnormality, atraumatic Eyes: negative Nose: Nares normal. Septum midline. Mucosa normal. No drainage or sinus tenderness. Neck: no JVD and supple, symmetrical, trachea midline Lungs: clear to auscultation bilaterally Heart: regular rate and rhythm, S1, S2 normal, no murmur, click, rub or gallop Abdomen: soft, non-tender; bowel sounds normal; no masses,  no organomegaly Extremities: extremities normal, atraumatic, no cyanosis or edema Pulses: 2+ and symmetric Skin: Skin color, texture, turgor normal. No rashes or lesions Neurologic: Grossly normal    Labs on Admission:   Recent Labs  10/04/15 1724  NA 137  K 4.2  CL 104  CO2 22  GLUCOSE 107*  BUN 17  CREATININE 1.36*  CALCIUM 9.7    Recent Labs  10/04/15 1724  AST 30  ALT 23  ALKPHOS 91  BILITOT 0.4  PROT 6.8  ALBUMIN 3.7    Recent Labs  10/04/15 1724  LIPASE 23    Recent Labs  10/04/15 1724  WBC 10.4  HGB 15.2*  HCT 45.0  MCV 87.4  PLT 310   Radiological Exams on Admission: Ct Renal Stone Study  10/04/2015  CLINICAL DATA:  One week of abdominal and low back pain, initial encounter EXAM: CT ABDOMEN AND PELVIS WITHOUT CONTRAST TECHNIQUE: Multidetector CT imaging of the abdomen and pelvis was performed following the standard protocol without IV contrast. COMPARISON:  Prior plain film from 09/28/2015 FINDINGS: Lung bases show some mild scarring in the left base. No acute infiltrate or sizable effusion is noted. The gallbladder has been surgically removed. The liver,  spleen, adrenal  glands and pancreas are within normal limits. Kidneys are well visualized bilaterally. The left kidney is somewhat ptotic. The bladder is decompressed. No ureteral stones are identified. The appendix is not visualize consistent with a prior surgical history. Scattered diverticular changes noted without evidence of diverticulitis. No other bowel abnormality is noted. Mild aortoiliac calcifications are seen without aneurysmal dilatation. Postsurgical changes in lower lumbar spine and right hip are noted. No acute bony abnormality is seen. IMPRESSION: Chronic changes as described without evidence of acute abnormality. Electronically Signed   By: Alcide Clever M.D.   On: 10/04/2015 22:15    Assessment/Plan  81 yo female with uti and n/v today with generalized weakness  Principal Problem:   UTI (lower urinary tract infection)- cover with cipro iv until clear she can take po.  Gentle ivf overnight.  Active Problems:   Secondary cardiomyopathy (HCC)- stable and compensated   GAD (generalized anxiety disorder)- stable   COPD (chronic obstructive pulmonary disease) (HCC)- cont albuterol.  Stable at this time   Dementia- noted   Dehydration- being given another 500cc bolus.  Check orthostatics.    obs on medical.  Full code.    Beryle Zeitz A 10/05/2015, 12:38 AM

## 2015-10-05 NOTE — Care Management Note (Signed)
Case Management Note  Patient Details  Name: [name] MRN: [id number] Date of Birth: 09-Oct-1934  Subjective/Objective:   Pt in with UTI on IV antibiotics. She is from home with family.                Action/Plan: Await medical work up. CM following for d/c needs.   Expected Discharge Date:  10/06/15               Expected Discharge Plan:  Home/Self Care  In-House Referral:     Discharge planning Services     Post Acute Care Choice:    Choice offered to:     DME Arranged:    DME Agency:     HH Arranged:    HH Agency:     Status of Service:  In process, will continue to follow  Medicare Important Message Given:    Date Medicare IM Given:    Medicare IM give by:    Date Additional Medicare IM Given:    Additional Medicare Important Message give by:     If discussed at Long Length of Stay Meetings, dates discussed:    Additional Comments:  Kermit Balo, RN 10/05/2015, 2:25 PM

## 2015-10-05 NOTE — Care Management Obs Status (Signed)
MEDICARE OBSERVATION STATUS NOTIFICATION   Patient Details  Name: SUZZETTE GASPARRO MRN: [id number] Date of Birth: 1934/05/06   Medicare Observation Status Notification Given:  Yes    Kermit Balo, RN 10/05/2015, 12:01 PM

## 2015-10-05 NOTE — Progress Notes (Signed)
Pt's daughters called multiple times. Explained to pt about HIPPA, and pt stated we will set up the pass word " BUD"

## 2015-10-06 ENCOUNTER — Observation Stay (HOSPITAL_COMMUNITY): Payer: Medicare Other

## 2015-10-06 DIAGNOSIS — N390 Urinary tract infection, site not specified: Secondary | ICD-10-CM | POA: Diagnosis not present

## 2015-10-06 DIAGNOSIS — R262 Difficulty in walking, not elsewhere classified: Secondary | ICD-10-CM | POA: Diagnosis not present

## 2015-10-06 LAB — BASIC METABOLIC PANEL
ANION GAP: 6 (ref 5–15)
BUN: 10 mg/dL (ref 6–20)
CALCIUM: 8.3 mg/dL — AB (ref 8.9–10.3)
CO2: 23 mmol/L (ref 22–32)
Chloride: 111 mmol/L (ref 101–111)
Creatinine, Ser: 1.09 mg/dL — ABNORMAL HIGH (ref 0.44–1.00)
GFR, EST AFRICAN AMERICAN: 55 mL/min — AB (ref >60)
GFR, EST NON AFRICAN AMERICAN: 47 mL/min — AB (ref >60)
GLUCOSE: 91 mg/dL (ref 65–99)
POTASSIUM: 3.7 mmol/L (ref 3.5–5.1)
Sodium: 140 mmol/L (ref 135–145)

## 2015-10-06 LAB — GLUCOSE, CAPILLARY
GLUCOSE-CAPILLARY: 98 mg/dL (ref 65–99)
Glucose-Capillary: 74 mg/dL (ref 65–99)

## 2015-10-06 MED ORDER — LOSARTAN POTASSIUM 50 MG PO TABS
25.0000 mg | ORAL_TABLET | Freq: Every day | ORAL | Status: DC
  Administered 2015-10-06 – 2015-10-07 (×2): 25 mg via ORAL
  Filled 2015-10-06 (×2): qty 1

## 2015-10-06 MED ORDER — IBUPROFEN 200 MG PO TABS
400.0000 mg | ORAL_TABLET | Freq: Once | ORAL | Status: AC
  Administered 2015-10-06: 400 mg via ORAL
  Filled 2015-10-06: qty 2

## 2015-10-06 NOTE — Progress Notes (Signed)
Pt c/o aching, tingling and soreness to right and left ankle. Pt unable to ambulate without discomfort. MD notified.

## 2015-10-06 NOTE — Evaluation (Signed)
Physical Therapy Evaluation Patient Details Name: [name] MRN: [id number] DOB: 11-04-1934 Today's Date: 10/06/2015   History of Present Illness  81 yo female h/o dementia diagnosed with uti placed on bactrim 3 days ago having dysuria, urinary urgency and incontinence comes in today because she vomited once and threw up her bactrim.  Clinical Impression  Patient presents with decreased independence with mobility due to pain in feet and other deficits listed in PT problem list.  She will benefit from skilled PT in the acute setting as well as follow up HHPT at d/c to ensure safety with mobility as she has intermittent assist only.     Follow Up Recommendations Home health PT    Equipment Recommendations  Other (comment) (may need RW, pt to ask son if she still has her walker from hip surgery)    Recommendations for Other Services       Precautions / Restrictions Precautions Precautions: Fall Restrictions Weight Bearing Restrictions: No      Mobility  Bed Mobility Overal bed mobility: Needs Assistance Bed Mobility: Supine to Sit;Sit to Supine     Supine to sit: HOB elevated;Supervision Sit to supine: Min assist   General bed mobility comments: pulls up on rail, assist for legs into bed and to right trunk  Transfers Overall transfer level: Needs assistance Equipment used: Rolling walker (2 wheeled) Transfers: Sit to/from Stand Sit to Stand: Min assist         General transfer comment: assist to rise due to pain in feet  Ambulation/Gait Ambulation/Gait assistance: Min assist Ambulation Distance (Feet): 8 Feet (and 6') Assistive device: Rolling walker (2 wheeled) Gait Pattern/deviations: Step-through pattern;Step-to pattern;Antalgic;Decreased stride length;Trunk flexed     General Gait Details: painful in feet with ambulation; first with slipper socks, then with her shoes to support arches with less antalgia and improved tolerance, relies heavily on  RW  Stairs            Wheelchair Mobility    Modified Rankin (Stroke Patients Only)       Balance Overall balance assessment: Needs assistance   Sitting balance-Leahy Scale: Good     Standing balance support: Bilateral upper extremity supported Standing balance-Leahy Scale: Poor Standing balance comment: needs UE support for pain in feet                             Pertinent Vitals/Pain Pain Assessment: 0-10 Pain Score: 9  Pain Location: ankles, bilateral Pain Descriptors / Indicators: Throbbing Pain Intervention(s): Monitored during session;Repositioned    Home Living Family/patient expects to be discharged to:: Private residence Living Arrangements: Children Available Help at Discharge: Family;Available PRN/intermittently Type of Home: House Home Access: Level entry     Home Layout: One level Home Equipment: Cane - single point      Prior Function Level of Independence: Independent         Comments: occasionally uses cane in community     Hand Dominance   Dominant Hand: Right    Extremity/Trunk Assessment   Upper Extremity Assessment: Overall WFL for tasks assessed           Lower Extremity Assessment: RLE deficits/detail;LLE deficits/detail RLE Deficits / Details: noted surgical scars on toes and obvious arthritic joint changes in toes and in arch; pt with pain along medial arch and note tenderness; sensation intact and strength grossly 4/5 LLE Deficits / Details: noted surgical scars on toes and obvious arthritic joint changes in  toes and in arch; pt with pain along medial arch and note tenderness; sensation intact and strength grossly 4/5     Communication   Communication: No difficulties  Cognition Arousal/Alertness: Awake/alert Behavior During Therapy: WFL for tasks assessed/performed Overall Cognitive Status: Within Functional Limits for tasks assessed                      General Comments General comments  (skin integrity, edema, etc.): reports had a walker from hip surgery, but not sure if she still has it; will ask her son and inform nursing if needs walker    Exercises        Assessment/Plan    PT Assessment Patient needs continued PT services  PT Diagnosis Difficulty walking;Acute pain   PT Problem List Decreased activity tolerance;Decreased knowledge of use of DME;Pain;Decreased mobility;Decreased balance  PT Treatment Interventions DME instruction;Gait training;Balance training;Functional mobility training;Patient/family education;Therapeutic activities;Therapeutic exercise   PT Goals (Current goals can be found in the Care Plan section) Acute Rehab PT Goals Patient Stated Goal: To go home PT Goal Formulation: With patient Time For Goal Achievement: 10/13/15 Potential to Achieve Goals: Good    Frequency Min 3X/week   Barriers to discharge        Co-evaluation               End of Session Equipment Utilized During Treatment: Gait belt Activity Tolerance: Patient limited by pain Patient left: in bed;with call bell/phone within reach      Functional Assessment Tool Used: Clinical judgement Functional Limitation: Mobility: Walking and moving around Mobility: Walking and Moving Around Current Status (M0802): At least 20 percent but less than 40 percent impaired, limited or restricted Mobility: Walking and Moving Around Goal Status [phone]): At least 1 percent but less than 20 percent impaired, limited or restricted    Time: 0818-0843 PT Time Calculation (min) (ACUTE ONLY): 25 min   Charges:   PT Evaluation $PT Eval Moderate Complexity: 1 Procedure PT Treatments $Gait Training: 8-22 mins   PT G Codes:   PT G-Codes **NOT FOR INPATIENT CLASS** Functional Assessment Tool Used: Clinical judgement Functional Limitation: Mobility: Walking and moving around Mobility: Walking and Moving Around Current Status (Q2449): At least 20 percent but less than 40 percent  impaired, limited or restricted Mobility: Walking and Moving Around Goal Status [phone]): At least 1 percent but less than 20 percent impaired, limited or restricted    Elray Mcgregor 10/06/2015, 11:36 AM  Sheran Lawless, PT [phone] 10/06/2015

## 2015-10-06 NOTE — Progress Notes (Signed)
PROGRESS NOTE                                                                                                                                                                                                             Patient Demographics:    [name], is a 81 y.o. female, DOB - [birth year], ZOX:[id number]  Admit date - 10/04/2015   Admitting Physician Haydee Monica, MD  Outpatient Primary MD for the patient is [name], MD  LOS -   Chief Complaint  Patient presents with  . Emesis  . Diarrhea       Brief Narrative   81 yo female h/o dementia diagnosed with uti placed on bactrim 3 days ago having dysuria, urinary urgency and incontinence comes because she vomited once and threw up her bactrim   Subjective:    Baxter Hire today has, No headache, No chest pain, No abdominal pain - No Further nausea or vomiting since admission.   Assessment  & Plan :    Principal Problem:   UTI (lower urinary tract infection) Active Problems:   Secondary cardiomyopathy (HCC)   GAD (generalized anxiety disorder)   COPD (chronic obstructive pulmonary disease) (HCC)   Dementia   Dehydration  UTI - treated with Bactrim as an outpatient , urine culture growing Escherichia coli, sensitive to Rocephin, will continue with Rocephin Total of 3 days, continue IV fluids.  Hypertension - Blood pressure acceptable, continue with coreg, will add cozaar today.  Hyperlipidemia - Continue statin  COPD - No active wheezing, continue with home medication  Dehydration - Continue with IV fluids  Dementia - Continue with all medication   Code Status : Full  Family Communication  : none at bedside  Disposition Plan  : Home in 1-2 days  Consults  :  None  Procedures  : None  DVT Prophylaxis  :  Heparin   Lab Results  Component Value Date   PLT 305 10/05/2015    Antibiotics  :    Anti-infectives    Start     Dose/Rate Route Frequency  Ordered Stop   10/05/15 0200  cefTRIAXone (ROCEPHIN) 1 g in dextrose 5 % 50 mL IVPB     1 g 100 mL/hr over 30 Minutes Intravenous Every 24 hours 10/05/15 0111     10/05/15 0015  ciprofloxacin (  CIPRO) IVPB 400 mg     400 mg 200 mL/hr over 60 Minutes Intravenous  Once 10/05/15 0010 10/05/15 0133        Objective:   Filed Vitals:   10/06/15 0500 10/06/15 0720 10/06/15 0814 10/06/15 0910  BP: 160/62  154/49 137/61  Pulse: 68  65 79  Temp: 98.1 F (36.7 C)  97.7 F (36.5 C) 97.7 F (36.5 C)  TempSrc: Oral  Oral Oral  Resp: 18  18 18   Height:      Weight:      SpO2: 97% 95% 96% 100%    Wt Readings from Last 3 Encounters:  10/05/15 72.6 kg (160 lb 0.9 oz)  09/30/15 72.122 kg (159 lb)  09/28/15 68.04 kg (150 lb)     Intake/Output Summary (Last 24 hours) at 10/06/15 1325 Last data filed at 10/06/15 0600  Gross per 24 hour  Intake   1620 ml  Output    450 ml  Net   1170 ml     Physical Exam  Awake Alert, Supple Neck,No JVD Symmetrical Chest wall movement, Good air movement bilaterally, CTAB RRR,No Gallops,Rubs or new Murmurs, No Parasternal Heave +ve B.Sounds, Abd Soft, No tenderness, No rebound - guarding or rigidity. No Cyanosis, Clubbing or edema, No new Rash or bruise      Data Review:    CBC  Recent Labs Lab 10/04/15 1724 10/05/15 0228  WBC 10.4 9.5  HGB 15.2* 13.8  HCT 45.0 41.7  PLT 310 305  MCV 87.4 89.5  MCH 29.5 29.6  MCHC 33.8 33.1  RDW 14.5 14.9    Chemistries   Recent Labs Lab 10/04/15 1724 10/05/15 0228 10/06/15 0240  NA 137 139 140  K 4.2 3.8 3.7  CL 104 104 111  CO2 22 26 23   GLUCOSE 107* 95 91  BUN 17 15 10   CREATININE 1.36* 1.29* 1.09*  CALCIUM 9.7 8.8* 8.3*  AST 30  --   --   ALT 23  --   --   ALKPHOS 91  --   --   BILITOT 0.4  --   --    ------------------------------------------------------------------------------------------------------------------ No results for input(s): CHOL, HDL, LDLCALC, TRIG, CHOLHDL,  LDLDIRECT in the last 72 hours.  Lab Results  Component Value Date   HGBA1C 5.8* 10/04/2013   ------------------------------------------------------------------------------------------------------------------ No results for input(s): TSH, T4TOTAL, T3FREE, THYROIDAB in the last 72 hours.  Invalid input(s): FREET3 ------------------------------------------------------------------------------------------------------------------ No results for input(s): VITAMINB12, FOLATE, FERRITIN, TIBC, IRON, RETICCTPCT in the last 72 hours.  Coagulation profile No results for input(s): INR, PROTIME in the last 168 hours.  No results for input(s): DDIMER in the last 72 hours.  Cardiac Enzymes No results for input(s): CKMB, TROPONINI, MYOGLOBIN in the last 168 hours.  Invalid input(s): CK ------------------------------------------------------------------------------------------------------------------    Component Value Date/Time   BNP 166.2* 02/21/2015 1833    Inpatient Medications  Scheduled Meds: . amitriptyline  25 mg Oral QHS  . aspirin EC  81 mg Oral Daily  . buPROPion  150 mg Oral BID  . calcium-vitamin D  2 tablet Oral Daily  . carvedilol  6.25 mg Oral BID WC  . cefTRIAXone (ROCEPHIN)  IV  1 g Intravenous Q24H  . citalopram  10 mg Oral Daily  . donepezil  10 mg Oral QHS  . fluticasone  2 spray Each Nare Daily  . heparin subcutaneous  5,000 Units Subcutaneous Q8H  . memantine  10 mg Oral BID  . mometasone-formoterol  2 puff Inhalation BID  .  pantoprazole  40 mg Oral Daily  . raloxifene  60 mg Oral Daily  . simvastatin  10 mg Oral q1800   Continuous Infusions: . sodium chloride 75 mL/hr at 10/06/15 0558   PRN Meds:.albuterol, ondansetron **OR** ondansetron (ZOFRAN) IV  Micro Results Recent Results (from the past 240 hour(s))  Microscopic Examination     Status: None   Collection Time: 09/30/15  2:34 PM  Result Value Ref Range Status   WBC, UA 0-5 0 -  5 /hpf Final   RBC,  UA 0-2 0 -  2 /hpf Final   Epithelial Cells (non renal) 0-10 0 - 10 /hpf Final   Bacteria, UA None seen None seen/Few Final  Urine culture     Status: Abnormal   Collection Time: 09/30/15  3:07 PM  Result Value Ref Range Status   Urine Culture, Routine Final report (A)  Final   Urine Culture result 1 Escherichia coli (A)  Final    Comment: 25,000-50,000 colony forming units per mL   ANTIMICROBIAL SUSCEPTIBILITY Comment  Final    Comment:       ** S = Susceptible; I = Intermediate; R = Resistant **                    P = Positive; N = Negative             MICS are expressed in micrograms per mL    Antibiotic                 RSLT#1    RSLT#2    RSLT#3    RSLT#4 Amoxicillin/Clavulanic Acid    R Ampicillin                     R Cefazolin                      R Cefepime                       S Ceftriaxone                    S Cefuroxime                     I Cephalothin                    R Ciprofloxacin                  S Ertapenem                      S Gentamicin                     S Imipenem                       S Levofloxacin                   S Nitrofurantoin                 S Piperacillin                   R Tetracycline                   R Tobramycin  S Trimethoprim/Sulfa             S     Radiology Reports Dg Lumbar Spine Complete  09/28/2015  CLINICAL DATA:  Medial low back pain with no injury for 1 day, pain does not radiate EXAM: LUMBAR SPINE - COMPLETE 4+ VIEW COMPARISON:  None. FINDINGS: Mild levoscoliosis lower lumbar spine. Normal anterior-posterior alignment. L4-5 posterior fixation with interbody fusion. Normal mild spondylosis T12-L1, L2-3, and L3-4. Moderate L5-S1 spondylosis. Calcification of the aortoiliac vessels. Right hip replacement noted. IMPRESSION: Mild scoliosis, degenerative, and postoperative change . Electronically Signed   By: Esperanza Heir M.D.   On: 09/28/2015 21:23   Ct Renal Stone Study  10/04/2015  CLINICAL DATA:  One week  of abdominal and low back pain, initial encounter EXAM: CT ABDOMEN AND PELVIS WITHOUT CONTRAST TECHNIQUE: Multidetector CT imaging of the abdomen and pelvis was performed following the standard protocol without IV contrast. COMPARISON:  Prior plain film from 09/28/2015 FINDINGS: Lung bases show some mild scarring in the left base. No acute infiltrate or sizable effusion is noted. The gallbladder has been surgically removed. The liver, spleen, adrenal glands and pancreas are within normal limits. Kidneys are well visualized bilaterally. The left kidney is somewhat ptotic. The bladder is decompressed. No ureteral stones are identified. The appendix is not visualize consistent with a prior surgical history. Scattered diverticular changes noted without evidence of diverticulitis. No other bowel abnormality is noted. Mild aortoiliac calcifications are seen without aneurysmal dilatation. Postsurgical changes in lower lumbar spine and right hip are noted. No acute bony abnormality is seen. IMPRESSION: Chronic changes as described without evidence of acute abnormality. Electronically Signed   By: Alcide Clever M.D.   On: 10/04/2015 22:15    Time Spent in minutes  20 minutes   Sabeen Piechocki M.D on 10/06/2015 at 1:25 PM  Between 7am to 7pm - Pager - [phone]  After 7pm go to [email or web address] - password [facility]  [facility] -  Office  [phone]

## 2015-10-06 NOTE — Care Management Note (Signed)
Case Management Note  Patient Details  Name: [name] MRN: [id number] Date of Birth: [birth year]  Subjective/Objective:                    Action/Plan: Plan is for patient to discharge home with [facility] services tomorrow. CM met with the patient and her daughter and provided them a list of [facility] agencies in the [place] area. They selected [place]. Tiffany with [facility] informed. CM will follow up tomorrow.   Expected Discharge Date:  10/06/15               Expected Discharge Plan:  Home/Self Care  In-House Referral:     Discharge planning Services     Post Acute Care Choice:    Choice offered to:     DME Arranged:    DME Agency:     HH Arranged:    HH Agency:     Status of Service:  In process, will continue to follow  Medicare Important Message Given:    Date Medicare IM Given:    Medicare IM give by:    Date Additional Medicare IM Given:    Additional Medicare Important Message give by:     If discussed at [place] of Stay Meetings, dates discussed:    Additional Comments:  Pollie Friar, RN 10/06/2015, 4:23 PM

## 2015-10-07 DIAGNOSIS — N390 Urinary tract infection, site not specified: Secondary | ICD-10-CM | POA: Diagnosis not present

## 2015-10-07 MED ORDER — COLCHICINE 0.6 MG PO TABS
0.6000 mg | ORAL_TABLET | Freq: Once | ORAL | Status: AC
  Administered 2015-10-07: 0.6 mg via ORAL
  Filled 2015-10-07: qty 1

## 2015-10-07 MED ORDER — METHYLPREDNISOLONE SODIUM SUCC 125 MG IJ SOLR
60.0000 mg | Freq: Once | INTRAMUSCULAR | Status: AC
  Administered 2015-10-07: 60 mg via INTRAVENOUS
  Filled 2015-10-07: qty 2

## 2015-10-07 MED ORDER — COLCHICINE 0.6 MG PO TABS: 0.6000 mg | ORAL_TABLET | Freq: Every day | ORAL | Status: DC

## 2015-10-07 MED ORDER — PREDNISONE 10 MG PO TABS: ORAL_TABLET | ORAL | Status: DC

## 2015-10-07 NOTE — Discharge Summary (Signed)
[name], is a 81 y.o. female  DOB 1935/03/07  MRN [id number].  Admission date:  10/04/2015  Admitting Physician  Haydee Monica, MD  Discharge Date:  10/07/2015   Primary MD  [name], MD  Recommendations for primary care physician for things to follow:  - please check CBC, BMP during next visit   Admission Diagnosis  Dehydration [E86.0] UTI (lower urinary tract infection) [N39.0]   Discharge Diagnosis  Dehydration [E86.0] UTI (lower urinary tract infection) [N39.0]    Principal Problem:   UTI (lower urinary tract infection) Active Problems:   Secondary cardiomyopathy (HCC)   GAD (generalized anxiety disorder)   COPD (chronic obstructive pulmonary disease) (HCC)   Dementia   Dehydration      Past Medical History  Diagnosis Date  . Essential hypertension, benign   . Mixed hyperlipidemia   . Depression   . Allergic rhinitis   . GERD (gastroesophageal reflux disease)   . Arthritis   . History of blood transfusion   . Left bundle branch block   . History of cardiac catheterization     Normal coronaries 2010  . Secondary cardiomyopathy (HCC)     LVEF 40-45%, likely nonischemic  . Anxiety   . Osteopenia   . Cataract     had eye surgery  . Memory loss     Past Surgical History  Procedure Laterality Date  . Cholecystectomy  1991  . Tonsillectomy    . Appendectomy    . Back surgery  07/21/2008  . Rotator cuff repair  2011    right  . Cataract extraction w/phaco  04/16/2011    Procedure: CATARACT EXTRACTION PHACO AND INTRAOCULAR LENS PLACEMENT (IOC);  Surgeon: Susa Simmonds;  Location: [facility];  Service: Ophthalmology;  Laterality: Left;  CDE=11.35  . Hammer toe surgery  11/22/11    [facility], [name]  . Cataract extraction w/phaco  01/28/2012    Procedure: CATARACT EXTRACTION PHACO AND INTRAOCULAR LENS PLACEMENT (IOC);  Surgeon: Susa Simmonds, MD;  Location: [facility];   Service: Ophthalmology;  Laterality: Right;  CDI:8.15  . Cysto with hydrodistension  05/29/2012    Procedure: CYSTOSCOPY/HYDRODISTENSION;  Surgeon: Martina Sinner, MD;  Location: [facility];  Service: Urology;  Laterality: N/A;  INSTILLATION OF MARCAINE AND PYRIDIUM   . Lumbar laminectomy/decompression microdiscectomy Left 06/06/2012    Procedure: LUMBAR LAMINECTOMY/DECOMPRESSION MICRODISCECTOMY 1 LEVEL;  Surgeon: Maeola Harman, MD;  Location: [facility];  Service: Neurosurgery;  Laterality: Left;  Left Lumbar two-three Laminectomy for resection of synovial cyst  . Total hip arthroplasty Right 03/13/2013    Procedure: TOTAL HIP ARTHROPLASTY;  Surgeon: Nestor Lewandowsky, MD;  Location: [facility];  Service: Orthopedics;  Laterality: Right;  . Eye surgery Bilateral     catar  . Abdominal hysterectomy      complete  . Spine surgery      back suregry 2011  . Joint replacement      right hip replacement 2013  History of present illness and  Hospital Course:     Kindly see H&P for history of present illness and admission details, please review complete Labs, Consult reports and Test reports for all details in brief  HPI  from the history and physical done on the day of admission 10/05/2015 81 yo female h/o dementia diagnosed with uti placed on bactrim 3 days ago having dysuria, urinary urgency and incontinence comes in today because she vomited once and threw up her bactrim. She is not improving as expected. No fevers. Still some suprapubic discomfort. No diarrhea. uc from PCP shows ecoli sensitive to bactrim, rocephin and cipro. Pt referred for admission for not feeling well and a cr of 1.3, given 500cc ivf in the ED.   Hospital Course  81 yo female h/o dementia diagnosed with uti placed on bactrim 3 days ago having dysuria, urinary urgency and incontinence comes because she vomited once and threw up her bactrim  UTI - treated with Bactrim as an outpatient , urine  culture growing Escherichia coli, sensitive to Rocephin, treated with Rocephin Total of 3 days,   Hypertension - Blood pressure acceptable, continue home medication on discharge.  Hyperlipidemia - Continue statin  COPD - No active wheezing, continue with home medication  Dehydration - Treated with IV fluids  Dementia - Continue with all medication  Right ankle gout flare - C5 use Benadryl today, we'll discharge on 3 days of colchicine and 3 days of prednisone taper  Discharge Condition:  stable   Follow UP  Follow-up Information    Follow up with [name], MD.   Specialty:  Pediatrics   Contact information:   [address] [place] [address] [phone]         Discharge Instructions  and  Discharge Medications     Discharge Instructions    Discharge instructions    Complete by:  As directed   Follow with Primary MD [name], MD in 7 days   Get CBC, CMP,checked  by Primary MD next visit.    Activity: As tolerated with Full fall precautions use walker/cane & assistance as needed   Disposition Home    Diet: Heart Healthy  , with feeding assistance and aspiration precautions.  For Heart failure patients - Check your Weight same time everyday, if you gain over 2 pounds, or you develop in leg swelling, experience more shortness of breath or chest pain, call your Primary MD immediately. Follow Cardiac Low Salt Diet and 1.5 lit/day fluid restriction.   On your next visit with your primary care physician please Get Medicines reviewed and adjusted.   Please request your Prim.MD to go over all Hospital Tests and Procedure/Radiological results at the follow up, please get all Hospital records sent to your Prim MD by signing hospital release before you go home.   If you experience worsening of your admission symptoms, develop shortness of breath, life threatening emergency, suicidal or homicidal thoughts you must seek medical attention immediately  by calling 911 or calling your MD immediately  if symptoms less severe.  You Must read complete instructions/literature along with all the possible adverse reactions/side effects for all the Medicines you take and that have been prescribed to you. Take any new Medicines after you have completely understood and accpet all the possible adverse reactions/side effects.   Do not drive, operating heavy machinery, perform activities at heights, swimming or participation in water activities or provide baby sitting services if your were admitted for  syncope or siezures until you have seen by Primary MD or a Neurologist and advised to do so again.  Do not drive when taking Pain medications.    Do not take more than prescribed Pain, Sleep and Anxiety Medications  Special Instructions: If you have smoked or chewed Tobacco  in the last 2 yrs please stop smoking, stop any regular Alcohol  and or any Recreational drug use.  Wear Seat belts while driving.   Please note  You were cared for by a hospitalist during your hospital stay. If you have any questions about your discharge medications or the care you received while you were in the hospital after you are discharged, you can call the unit and asked to speak with the hospitalist on call if the hospitalist that took care of you is not available. Once you are discharged, your primary care physician will handle any further medical issues. Please note that NO REFILLS for any discharge medications will be authorized once you are discharged, as it is imperative that you return to your primary care physician (or establish a relationship with a primary care physician if you do not have one) for your aftercare needs so that they can reassess your need for medications and monitor your lab values.     Increase activity slowly    Complete by:  As directed             Medication List    STOP taking these medications        ibuprofen 800 MG tablet  Commonly known  as:  ADVIL,MOTRIN     sulfamethoxazole-trimethoprim 800-160 MG tablet  Commonly known as:  BACTRIM DS      TAKE these medications        albuterol 108 (90 Base) MCG/ACT inhaler  Commonly known as:  PROVENTIL HFA;VENTOLIN HFA  Inhale 2 puffs into the lungs every 6 (six) hours as needed for wheezing or shortness of breath.     amitriptyline 25 MG tablet  Commonly known as:  ELAVIL  Take 1 tablet (25 mg total) by mouth at bedtime.     aspirin EC 81 MG tablet  Take 81 mg by mouth daily.     Biotin 5000 MCG Caps  Take 1 capsule by mouth daily.     budesonide-formoterol 160-4.5 MCG/ACT inhaler  Commonly known as:  SYMBICORT  Inhale 2 puffs into the lungs 2 (two) times daily.     buPROPion 150 MG 12 hr tablet  Commonly known as:  WELLBUTRIN SR  Take 1 tablet (150 mg total) by mouth 2 (two) times daily.     calcium citrate-vitamin D 315-200 MG-UNIT tablet  Commonly known as:  CITRACAL+D  Take 2 tablets by mouth daily.     carvedilol 6.25 MG tablet  Commonly known as:  COREG  Take 1 tablet (6.25 mg total) by mouth 2 (two) times daily with a meal.     cetirizine 10 MG tablet  Commonly known as:  ZYRTEC  Take 1 tablet (10 mg total) by mouth daily.     citalopram 10 MG tablet  Commonly known as:  CELEXA  Take 1 tablet (10 mg total) by mouth daily.     colchicine 0.6 MG tablet  Take 1 tablet (0.6 mg total) by mouth daily. take for 3 days and stop     donepezil 10 MG tablet  Commonly known as:  ARICEPT  Take 1 tablet (10 mg total) by mouth at bedtime.     esomeprazole  40 MG capsule  Commonly known as:  NEXIUM  Take 1 capsule (40 mg total) by mouth daily before breakfast.     fluticasone 50 MCG/ACT nasal spray  Commonly known as:  FLONASE  Place 2 sprays into both nostrils daily.     furosemide 20 MG tablet  Commonly known as:  LASIX  Take 0.5 tablets (10 mg total) by mouth daily.     HYDROcodone-acetaminophen 5-325 MG tablet  Commonly known as:  NORCO/VICODIN    Take 1 tablet by mouth every 6 (six) hours as needed for moderate pain or severe pain.     losartan 50 MG tablet  Commonly known as:  COZAAR  TAKE 1 TABLET (50 MG TOTAL) BY MOUTH DAILY.     memantine 10 MG tablet  Commonly known as:  NAMENDA  Take 1 tablet (10 mg total) by mouth 2 (two) times daily.     methocarbamol 500 MG tablet  Commonly known as:  ROBAXIN  Take 1 tablet (500 mg total) by mouth every 8 (eight) hours as needed for muscle spasms.     predniSONE 10 MG tablet  Commonly known as:  DELTASONE  Please take 30 mg for 1 day, then 20 mg for 1 day, then 10 mg for one day then stop     raloxifene 60 MG tablet  Commonly known as:  EVISTA  Take 1 tablet (60 mg total) by mouth daily.     simvastatin 10 MG tablet  Commonly known as:  ZOCOR  TAKE 1 TABLET (10 MG TOTAL) BY MOUTH AT BEDTIME.     traMADol 50 MG tablet  Commonly known as:  ULTRAM  Take 1 tablet (50 mg total) by mouth every 6 (six) hours as needed.     vitamin B-12 1000 MCG tablet  Commonly known as:  CYANOCOBALAMIN  Take 0.5 tablets (500 mcg total) by mouth daily.     Vitamin D (Ergocalciferol) 50000 units Caps capsule  Commonly known as:  DRISDOL  Take 1 capsule (50,000 Units total) by mouth once a week.          Diet and Activity recommendation: See Discharge Instructions above   Consults obtained -  none   Major procedures and Radiology Reports - PLEASE review detailed and final reports for all details, in brief -     Dg Lumbar Spine Complete  09/28/2015  CLINICAL DATA:  Medial low back pain with no injury for 1 day, pain does not radiate EXAM: LUMBAR SPINE - COMPLETE 4+ VIEW COMPARISON:  None. FINDINGS: Mild levoscoliosis lower lumbar spine. Normal anterior-posterior alignment. L4-5 posterior fixation with interbody fusion. Normal mild spondylosis T12-L1, L2-3, and L3-4. Moderate L5-S1 spondylosis. Calcification of the aortoiliac vessels. Right hip replacement noted. IMPRESSION: Mild  scoliosis, degenerative, and postoperative change . Electronically Signed   By: Esperanza Heir M.D.   On: 09/28/2015 21:23   Dg Ankle 2 Views Left  10/06/2015  CLINICAL DATA:  Difficulty bearing weight, initial encounter, no known injury EXAM: LEFT ANKLE - 2 VIEW COMPARISON:  None. FINDINGS: There is no evidence of fracture, dislocation, or joint effusion. There is no evidence of arthropathy or other focal bone abnormality. Soft tissues are unremarkable. IMPRESSION: No acute abnormality noted. Electronically Signed   By: Alcide Clever M.D.   On: 10/06/2015 18:49   Dg Ankle 2 Views Right  10/06/2015  CLINICAL DATA:  Unable to bear weight, no known injury, initial encounter EXAM: RIGHT ANKLE - 2 VIEW COMPARISON:  None. FINDINGS: There  is no evidence of fracture, dislocation, or joint effusion. There is no evidence of arthropathy or other focal bone abnormality. Soft tissues are unremarkable. IMPRESSION: No acute abnormality noted. Electronically Signed   By: Alcide Clever M.D.   On: 10/06/2015 18:51   Ct Renal Stone Study  10/04/2015  CLINICAL DATA:  One week of abdominal and low back pain, initial encounter EXAM: CT ABDOMEN AND PELVIS WITHOUT CONTRAST TECHNIQUE: Multidetector CT imaging of the abdomen and pelvis was performed following the standard protocol without IV contrast. COMPARISON:  Prior plain film from 09/28/2015 FINDINGS: Lung bases show some mild scarring in the left base. No acute infiltrate or sizable effusion is noted. The gallbladder has been surgically removed. The liver, spleen, adrenal glands and pancreas are within normal limits. Kidneys are well visualized bilaterally. The left kidney is somewhat ptotic. The bladder is decompressed. No ureteral stones are identified. The appendix is not visualize consistent with a prior surgical history. Scattered diverticular changes noted without evidence of diverticulitis. No other bowel abnormality is noted. Mild aortoiliac calcifications are seen  without aneurysmal dilatation. Postsurgical changes in lower lumbar spine and right hip are noted. No acute bony abnormality is seen. IMPRESSION: Chronic changes as described without evidence of acute abnormality. Electronically Signed   By: Alcide Clever M.D.   On: 10/04/2015 22:15    Micro Results    Recent Results (from the past 240 hour(s))  Microscopic Examination     Status: None   Collection Time: 09/30/15  2:34 PM  Result Value Ref Range Status   WBC, UA 0-5 0 -  5 /hpf Final   RBC, UA 0-2 0 -  2 /hpf Final   Epithelial Cells (non renal) 0-10 0 - 10 /hpf Final   Bacteria, UA None seen None seen/Few Final  Urine culture     Status: Abnormal   Collection Time: 09/30/15  3:07 PM  Result Value Ref Range Status   Urine Culture, Routine Final report (A)  Final   Urine Culture result 1 Escherichia coli (A)  Final    Comment: 25,000-50,000 colony forming units per mL   ANTIMICROBIAL SUSCEPTIBILITY Comment  Final    Comment:       ** S = Susceptible; I = Intermediate; R = Resistant **                    P = Positive; N = Negative             MICS are expressed in micrograms per mL    Antibiotic                 RSLT#1    RSLT#2    RSLT#3    RSLT#4 Amoxicillin/Clavulanic Acid    R Ampicillin                     R Cefazolin                      R Cefepime                       S Ceftriaxone                    S Cefuroxime                     I Cephalothin  R Ciprofloxacin                  S Ertapenem                      S Gentamicin                     S Imipenem                       S Levofloxacin                   S Nitrofurantoin                 S Piperacillin                   R Tetracycline                   R Tobramycin                     S Trimethoprim/Sulfa             S        Today   Subjective:   [name] today has no headache,no chest abdominal pain, complains of right ankle pain, feels much better wants to go home today.    Objective:   Blood pressure 150/56, pulse 60, temperature 98.1 F (36.7 C), temperature source Oral, resp. rate 20, height  (1.651 m), weight 72.6 kg (160 lb 0.9 oz), SpO2 97 %.   Intake/Output Summary (Last 24 hours) at 10/07/15 1705 Last data filed at 10/07/15 0649  Gross per 24 hour  Intake   1130 ml  Output      0 ml  Net   1130 ml    Exam  Awake Alert, Supple Neck,No JVD Symmetrical Chest wall movement, Good air movement bilaterally, CTAB RRR,No Gallops,Rubs or new Murmurs, No Parasternal Heave +ve B.Sounds, Abd Soft, No tenderness, No rebound - guarding or rigidity. No Cyanosis, Clubbing or edema, No new Rash or bruise , mild medial right ankle tenderness to palpation, but no erythema or swelling Data Review   CBC w Diff: Lab Results  Component Value Date   WBC 9.5 10/05/2015   WBC 7.9 08/09/2015   WBC 7.3 06/24/2013   HGB 13.8 10/05/2015   HGB 13.6 06/24/2013   HCT 41.7 10/05/2015   HCT 37.8 08/09/2015   HCT 41.3 06/24/2013   PLT 305 10/05/2015   PLT 325 08/09/2015   LYMPHOPCT 35 09/28/2015   MONOPCT 4 09/28/2015   EOSPCT 1 09/28/2015   BASOPCT 0 09/28/2015    CMP: Lab Results  Component Value Date   NA 140 10/06/2015   NA 146* 08/09/2015   K 3.7 10/06/2015   CL 111 10/06/2015   CO2 23 10/06/2015   BUN 10 10/06/2015   BUN 12 08/09/2015   CREATININE 1.09* 10/06/2015   CREATININE 0.94 10/27/2012   PROT 6.8 10/04/2015   PROT 6.5 08/09/2015   ALBUMIN 3.7 10/04/2015   ALBUMIN 4.0 08/09/2015   BILITOT 0.4 10/04/2015   BILITOT <0.2 08/09/2015   ALKPHOS 91 10/04/2015   AST 30 10/04/2015   ALT 23 10/04/2015  .   Total Time in preparing paper work, data evaluation and todays exam - 35 minutes  Tamber Burtch M.D on 10/07/2015 at 5:05 PM  [facility]   Office  [phone]

## 2015-10-07 NOTE — Progress Notes (Signed)
Physical Therapy Treatment Patient Details Name: [name] MRN: [id number] DOB: 08-19-1934 Today's Date: 10/07/2015    History of Present Illness 81 yo female h/o dementia diagnosed with uti placed on bactrim 3 days ago having dysuria, urinary urgency and incontinence comes in today because she vomited once and threw up her bactrim.    PT Comments    Patient is progressing toward mobility goals. Grossly supervision/min guard for OOB mobility. Improved gait pattern with use of RW. C/o pain R ankle but reported that his has improved. Current plan remains appropriate.   Follow Up Recommendations  Home health PT     Equipment Recommendations  Rolling walker with 5" wheels    Recommendations for Other Services       Precautions / Restrictions Precautions Precautions: Fall Restrictions Weight Bearing Restrictions: No    Mobility  Bed Mobility Overal bed mobility: Modified Independent Bed Mobility: Supine to Sit;Sit to Supine     Supine to sit: Modified independent (Device/Increase time) Sit to supine: Modified independent (Device/Increase time)   General bed mobility comments: increased time  Transfers Overall transfer level: Needs assistance Equipment used: None;1 person hand held assist Transfers: Sit to/from Stand Sit to Stand: Min guard         General transfer comment: min guard for safety; HHA upon standing for balance due to decreased weight shift to R LE  Ambulation/Gait Ambulation/Gait assistance: Min assist;Supervision Ambulation Distance (Feet): 150 Feet Assistive device: 1 person hand held assist;Rolling walker (2 wheeled) Gait Pattern/deviations: Step-through pattern;Decreased stride length;Decreased stance time - right;Decreased weight shift to right;Antalgic;Narrow base of support     General Gait Details: HHA initially for ~16 ft with unsteadiness due to antalgic gait pattern; pt with improved balance with RW; cues for position of RW, bilat  step length, and wider BOS   Stairs            Wheelchair Mobility    Modified Rankin (Stroke Patients Only)       Balance Overall balance assessment: Needs assistance Sitting-balance support: Feet supported;No upper extremity supported Sitting balance-Leahy Scale: Good     Standing balance support: Bilateral upper extremity supported Standing balance-Leahy Scale: Poor                      Cognition Arousal/Alertness: Awake/alert Behavior During Therapy: WFL for tasks assessed/performed Overall Cognitive Status: Within Functional Limits for tasks assessed                      Exercises General Exercises - Lower Extremity Ankle Circles/Pumps: AROM;Both;10 reps;Seated Long Arc Quad: Strengthening;Both;15 reps;Seated Hip ABduction/ADduction: Strengthening;Both;10 reps;Seated Hip Flexion/Marching: Strengthening;Both;15 reps;Seated    General Comments        Pertinent Vitals/Pain Pain Assessment: 0-10 Pain Score: 8  Pain Location: R ankle Pain Descriptors / Indicators: Aching;Sore Pain Intervention(s): Monitored during session;Limited activity within patient's tolerance    Home Living                      Prior Function            PT Goals (current goals can now be found in the care plan section) Acute Rehab PT Goals Patient Stated Goal: To go home Progress towards PT goals: Progressing toward goals    Frequency  Min 3X/week    PT Plan Current plan remains appropriate    Co-evaluation             End of  Session Equipment Utilized During Treatment: Gait belt Activity Tolerance: Patient tolerated treatment well Patient left: in bed;with call bell/phone within reach     Time: 1324-1348 PT Time Calculation (min) (ACUTE ONLY): 24 min  Charges:  $Gait Training: 8-22 mins $Therapeutic Exercise: 8-22 mins                    G Codes:      Derek Mound, PTA Pager: [phone]   10/07/2015, 2:01  PM

## 2015-10-07 NOTE — Care Management Note (Signed)
Case Management Note  Patient Details  Name: [name] MRN: [id number] Date of Birth: Dec 30, 1934  Subjective/Objective:                    Action/Plan: Spoke with Tiffany with [facility] late yesterday and they accepted the referral. CM will monitor for further d/c needs.   Expected Discharge Date:  10/06/15               Expected Discharge Plan:  Home/Self Care  In-House Referral:     Discharge planning Services     Post Acute Care Choice:    Choice offered to:     DME Arranged:    DME Agency:     HH Arranged:    HH Agency:     Status of Service:  In process, will continue to follow  Medicare Important Message Given:    Date Medicare IM Given:    Medicare IM give by:    Date Additional Medicare IM Given:    Additional Medicare Important Message give by:     If discussed at Long Length of Stay Meetings, dates discussed:    Additional Comments:  Kermit Balo, RN 10/07/2015, 9:09 AM

## 2015-10-07 NOTE — Discharge Instructions (Signed)
Follow with Primary MD [name], MD in 7 days   Get CBC, CMP,checked  by Primary MD next visit.    Activity: As tolerated with Full fall precautions use walker/cane & assistance as needed   Disposition Home    Diet: Heart Healthy  , with feeding assistance and aspiration precautions.  For Heart failure patients - Check your Weight same time everyday, if you gain over 2 pounds, or you develop in leg swelling, experience more shortness of breath or chest pain, call your Primary MD immediately. Follow Cardiac Low Salt Diet and 1.5 lit/day fluid restriction.   On your next visit with your primary care physician please Get Medicines reviewed and adjusted.   Please request your Prim.MD to go over all Hospital Tests and Procedure/Radiological results at the follow up, please get all Hospital records sent to your Prim MD by signing hospital release before you go home.   If you experience worsening of your admission symptoms, develop shortness of breath, life threatening emergency, suicidal or homicidal thoughts you must seek medical attention immediately by calling 911 or calling your MD immediately  if symptoms less severe.  You Must read complete instructions/literature along with all the possible adverse reactions/side effects for all the Medicines you take and that have been prescribed to you. Take any new Medicines after you have completely understood and accpet all the possible adverse reactions/side effects.   Do not drive, operating heavy machinery, perform activities at heights, swimming or participation in water activities or provide baby sitting services if your were admitted for syncope or siezures until you have seen by Primary MD or a Neurologist and advised to do so again.  Do not drive when taking Pain medications.    Do not take more than prescribed Pain, Sleep and Anxiety Medications  Special Instructions: If you have smoked or chewed Tobacco  in the last 2 yrs  please stop smoking, stop any regular Alcohol  and or any Recreational drug use.  Wear Seat belts while driving.   Please note  You were cared for by a hospitalist during your hospital stay. If you have any questions about your discharge medications or the care you received while you were in the hospital after you are discharged, you can call the unit and asked to speak with the hospitalist on call if the hospitalist that took care of you is not available. Once you are discharged, your primary care physician will handle any further medical issues. Please note that NO REFILLS for any discharge medications will be authorized once you are discharged, as it is imperative that you return to your primary care physician (or establish a relationship with a primary care physician if you do not have one) for your aftercare needs so that they can reassess your need for medications and monitor your lab values.

## 2015-10-07 NOTE — Progress Notes (Signed)
Pt discharging at this time taking all personal belongings. IV discontinued, dry dressing applied. Discharge instructions along with prescriptions provided with verbal understanding. Pt aware of follow up appts. No noted distress.

## 2015-10-10 ENCOUNTER — Telehealth: Payer: Self-pay | Admitting: Pediatrics

## 2015-10-10 NOTE — Telephone Encounter (Signed)
Pt given appt with Jannifer Rodney 6/15 at 2:40.

## 2015-10-11 DIAGNOSIS — F0390 Unspecified dementia without behavioral disturbance: Secondary | ICD-10-CM | POA: Diagnosis not present

## 2015-10-11 DIAGNOSIS — I429 Cardiomyopathy, unspecified: Secondary | ICD-10-CM | POA: Diagnosis not present

## 2015-10-11 DIAGNOSIS — I10 Essential (primary) hypertension: Secondary | ICD-10-CM | POA: Diagnosis not present

## 2015-10-11 DIAGNOSIS — F411 Generalized anxiety disorder: Secondary | ICD-10-CM | POA: Diagnosis not present

## 2015-10-11 DIAGNOSIS — J449 Chronic obstructive pulmonary disease, unspecified: Secondary | ICD-10-CM | POA: Diagnosis not present

## 2015-10-11 DIAGNOSIS — N390 Urinary tract infection, site not specified: Secondary | ICD-10-CM | POA: Diagnosis not present

## 2015-10-13 ENCOUNTER — Ambulatory Visit (INDEPENDENT_AMBULATORY_CARE_PROVIDER_SITE_OTHER): Payer: Medicare Other | Admitting: Family

## 2015-10-13 ENCOUNTER — Encounter: Payer: Self-pay | Admitting: Family

## 2015-10-13 VITALS — BP 139/82 | HR 70 | Temp 98.8°F | Ht 65.0 in | Wt 160.2 lb

## 2015-10-13 DIAGNOSIS — K219 Gastro-esophageal reflux disease without esophagitis: Secondary | ICD-10-CM

## 2015-10-13 DIAGNOSIS — J45909 Unspecified asthma, uncomplicated: Secondary | ICD-10-CM | POA: Diagnosis not present

## 2015-10-13 DIAGNOSIS — M109 Gout, unspecified: Secondary | ICD-10-CM | POA: Diagnosis not present

## 2015-10-13 DIAGNOSIS — F329 Major depressive disorder, single episode, unspecified: Secondary | ICD-10-CM

## 2015-10-13 DIAGNOSIS — Z720 Tobacco use: Secondary | ICD-10-CM

## 2015-10-13 DIAGNOSIS — R600 Localized edema: Secondary | ICD-10-CM

## 2015-10-13 DIAGNOSIS — F32A Depression, unspecified: Secondary | ICD-10-CM

## 2015-10-13 DIAGNOSIS — Z87891 Personal history of nicotine dependence: Secondary | ICD-10-CM

## 2015-10-13 DIAGNOSIS — I10 Essential (primary) hypertension: Secondary | ICD-10-CM

## 2015-10-13 DIAGNOSIS — Z87440 Personal history of urinary (tract) infections: Secondary | ICD-10-CM | POA: Diagnosis not present

## 2015-10-13 DIAGNOSIS — M810 Age-related osteoporosis without current pathological fracture: Secondary | ICD-10-CM | POA: Diagnosis not present

## 2015-10-13 DIAGNOSIS — Z09 Encounter for follow-up examination after completed treatment for conditions other than malignant neoplasm: Secondary | ICD-10-CM | POA: Diagnosis not present

## 2015-10-13 DIAGNOSIS — R609 Edema, unspecified: Secondary | ICD-10-CM | POA: Diagnosis not present

## 2015-10-13 MED ORDER — BUPROPION HCL ER (SR) 150 MG PO TB12: 150.0000 mg | ORAL_TABLET | Freq: Two times a day (BID) | ORAL | Status: DC

## 2015-10-13 MED ORDER — CARVEDILOL 6.25 MG PO TABS: 6.2500 mg | ORAL_TABLET | Freq: Two times a day (BID) | ORAL | Status: DC

## 2015-10-13 MED ORDER — DONEPEZIL HCL 10 MG PO TABS: 10.0000 mg | ORAL_TABLET | Freq: Every day | ORAL | Status: DC

## 2015-10-13 MED ORDER — FUROSEMIDE 20 MG PO TABS: 10.0000 mg | ORAL_TABLET | Freq: Every day | ORAL | Status: DC

## 2015-10-13 MED ORDER — MEMANTINE HCL 10 MG PO TABS: 10.0000 mg | ORAL_TABLET | Freq: Two times a day (BID) | ORAL | Status: DC

## 2015-10-13 MED ORDER — LOSARTAN POTASSIUM 50 MG PO TABS: 25.0000 mg | ORAL_TABLET | Freq: Every day | ORAL | Status: DC

## 2015-10-13 MED ORDER — RALOXIFENE HCL 60 MG PO TABS: 60.0000 mg | ORAL_TABLET | Freq: Every day | ORAL | Status: DC

## 2015-10-13 MED ORDER — CITALOPRAM HYDROBROMIDE 10 MG PO TABS: 10.0000 mg | ORAL_TABLET | Freq: Every day | ORAL | Status: DC

## 2015-10-13 MED ORDER — BUDESONIDE-FORMOTEROL FUMARATE 160-4.5 MCG/ACT IN AERO: 2.0000 | INHALATION_SPRAY | Freq: Two times a day (BID) | RESPIRATORY_TRACT | Status: DC

## 2015-10-13 MED ORDER — AMITRIPTYLINE HCL 25 MG PO TABS: 25.0000 mg | ORAL_TABLET | Freq: Every day | ORAL | Status: DC

## 2015-10-13 MED ORDER — ESOMEPRAZOLE MAGNESIUM 40 MG PO CPDR: 40.0000 mg | DELAYED_RELEASE_CAPSULE | Freq: Every day | ORAL | Status: DC

## 2015-10-13 NOTE — Progress Notes (Signed)
Subjective:    Patient ID: [name], female    DOB: May 09, 1934, 81 y.o.   MRN: [id number]  HPI Pt presents to the office today  For hospital follow up for gout, dehydration, and UTI. PT was given rocephin for 3 days for UTI, hydration, and colchicine and prednisone for the gout. Pt reports feeling much better and denies any dysuria, fever, or fatigue. Pt's daughter is requesting that her mother be taken off of some of the medication that is not needed. PT would also like her prescriptions sent to a new pharmacy.    Review of Systems  Constitutional: Negative.   HENT: Negative.   Eyes: Negative.   Respiratory: Negative.  Negative for shortness of breath.   Cardiovascular: Negative.  Negative for palpitations.  Gastrointestinal: Negative.   Endocrine: Negative.   Genitourinary: Negative.   Musculoskeletal: Negative.   Neurological: Negative.  Negative for headaches.  Hematological: Negative.   Psychiatric/Behavioral: Negative.   All other systems reviewed and are negative.      Objective:   Physical Exam  Constitutional: She is oriented to person, place, and time. She appears well-developed and well-nourished. No distress.  HENT:  Head: Normocephalic and atraumatic.  Eyes: Pupils are equal, round, and reactive to light.  Neck: Normal range of motion. Neck supple. No thyromegaly present.  Cardiovascular: Normal rate, regular rhythm, normal heart sounds and intact distal pulses.   No murmur heard. Pulmonary/Chest: Effort normal and breath sounds normal. No respiratory distress. She has no wheezes.  Abdominal: Soft. Bowel sounds are normal. She exhibits no distension. There is no tenderness.  Musculoskeletal: Normal range of motion. She exhibits no edema or tenderness.  Neurological: She is alert and oriented to person, place, and time. She has normal reflexes. No cranial nerve deficit.  Skin: Skin is warm and dry.  Psychiatric: She has a normal mood and affect. Her  behavior is normal. Judgment and thought content normal.  Vitals reviewed.     BP 139/82 mmHg  Pulse 70  Temp(Src) 98.8 F (37.1 C) (Oral)  Ht 5' 5"  (1.651 m)  Wt 160 lb 3.2 oz (72.666 kg)  BMI 26.66 kg/m2     Assessment & Plan:  1. Depression - citalopram (CELEXA) 10 MG tablet; Take 1 tablet (10 mg total) by mouth daily.  Dispense: 30 tablet; Refill: 1 - CMP14+EGFR - CBC with Differential/Platelet  2. Osteoporosis - raloxifene (EVISTA) 60 MG tablet; Take 1 tablet (60 mg total) by mouth daily.  Dispense: 90 tablet; Refill: 1 - CMP14+EGFR - CBC with Differential/Platelet  3. Essential hypertension - losartan (COZAAR) 50 MG tablet; Take 0.5 tablets (25 mg total) by mouth daily.  Dispense: 90 tablet; Refill: 1 - CMP14+EGFR - CBC with Differential/Platelet  4. Peripheral edema - furosemide (LASIX) 20 MG tablet; Take 0.5 tablets (10 mg total) by mouth daily.  Dispense: 30 tablet; Refill: 5 - CMP14+EGFR - CBC with Differential/Platelet  5. Gastroesophageal reflux disease, esophagitis presence not specified - esomeprazole (NEXIUM) 40 MG capsule; Take 1 capsule (40 mg total) by mouth daily before breakfast.  Dispense: 90 capsule; Refill: 1 - CMP14+EGFR - CBC with Differential/Platelet  6. Asthma, chronic, unspecified asthma severity, uncomplicated - budesonide-formoterol (SYMBICORT) 160-4.5 MCG/ACT inhaler; Inhale 2 puffs into the lungs 2 (two) times daily.  Dispense: 1 Inhaler; Refill: 3 - CMP14+EGFR - CBC with Differential/Platelet  7. Stopped smoking - buPROPion (WELLBUTRIN SR) 150 MG 12 hr tablet; Take 1 tablet (150 mg total) by mouth 2 (two) times daily.  Dispense: 180 tablet; Refill: 1 - CMP14+EGFR - CBC with Differential/Platelet  8. Hospital discharge follow-up - CMP14+EGFR - CBC with Differential/Platelet  9. History of UTI - CMP14+EGFR - CBC with Differential/Platelet  10. Gout of multiple sites, unspecified cause, unspecified chronicity - CMP14+EGFR -  CBC with Differential/Platelet   Continue all meds Labs pending Health Maintenance reviewed Diet and exercise encouraged RTO 3 months  Evelina Dun, FNP

## 2015-10-13 NOTE — Patient Instructions (Signed)

## 2015-10-14 ENCOUNTER — Encounter: Payer: Self-pay | Admitting: Family Medicine

## 2015-10-14 ENCOUNTER — Ambulatory Visit (INDEPENDENT_AMBULATORY_CARE_PROVIDER_SITE_OTHER): Payer: Medicare Other | Admitting: Family Medicine

## 2015-10-14 VITALS — BP 135/79 | HR 72 | Temp 97.1°F | Ht 65.0 in | Wt 157.8 lb

## 2015-10-14 DIAGNOSIS — F411 Generalized anxiety disorder: Secondary | ICD-10-CM | POA: Diagnosis not present

## 2015-10-14 DIAGNOSIS — J449 Chronic obstructive pulmonary disease, unspecified: Secondary | ICD-10-CM | POA: Diagnosis not present

## 2015-10-14 DIAGNOSIS — I10 Essential (primary) hypertension: Secondary | ICD-10-CM | POA: Diagnosis not present

## 2015-10-14 DIAGNOSIS — I429 Cardiomyopathy, unspecified: Secondary | ICD-10-CM | POA: Diagnosis not present

## 2015-10-14 DIAGNOSIS — H9311 Tinnitus, right ear: Secondary | ICD-10-CM

## 2015-10-14 DIAGNOSIS — F0390 Unspecified dementia without behavioral disturbance: Secondary | ICD-10-CM | POA: Diagnosis not present

## 2015-10-14 DIAGNOSIS — N390 Urinary tract infection, site not specified: Secondary | ICD-10-CM | POA: Diagnosis not present

## 2015-10-14 LAB — CBC WITH DIFFERENTIAL/PLATELET
Basophils Absolute: 0 10*3/uL (ref 0.0–0.2)
Basos: 0 %
EOS (ABSOLUTE): 0.1 10*3/uL (ref 0.0–0.4)
Eos: 1 %
Hematocrit: 41.6 % (ref 34.0–46.6)
Hemoglobin: 13.4 g/dL (ref 11.1–15.9)
IMMATURE GRANULOCYTES: 0 %
Immature Grans (Abs): 0 10*3/uL (ref 0.0–0.1)
LYMPHS ABS: 3.2 10*3/uL — AB (ref 0.7–3.1)
Lymphs: 35 %
MCH: 29.7 pg (ref 26.6–33.0)
MCHC: 32.2 g/dL (ref 31.5–35.7)
MCV: 92 fL (ref 79–97)
MONOS ABS: 0.4 10*3/uL (ref 0.1–0.9)
Monocytes: 4 %
NEUTROS PCT: 60 %
Neutrophils Absolute: 5.4 10*3/uL (ref 1.4–7.0)
PLATELETS: 335 10*3/uL (ref 150–379)
RBC: 4.51 x10E6/uL (ref 3.77–5.28)
RDW: 15 % (ref 12.3–15.4)
WBC: 9.1 10*3/uL (ref 3.4–10.8)

## 2015-10-14 LAB — CMP14+EGFR
ALBUMIN: 4 g/dL (ref 3.5–4.8)
ALT: 18 IU/L (ref 0–32)
AST: 19 IU/L (ref 0–40)
Albumin/Globulin Ratio: 1.7 (ref 1.2–2.2)
Alkaline Phosphatase: 100 IU/L (ref 39–117)
BUN/Creatinine Ratio: 14 (ref 12–28)
BUN: 13 mg/dL (ref 8–27)
Bilirubin Total: <0.2 mg/dL (ref 0.0–1.2)
CALCIUM: 8.9 mg/dL (ref 8.7–10.3)
CHLORIDE: 102 mmol/L (ref 96–106)
CO2: 23 mmol/L (ref 18–29)
Creatinine, Ser: 0.96 mg/dL (ref 0.57–1.00)
GFR calc Af Amer: 66 mL/min/{1.73_m2} (ref >59)
GFR calc non Af Amer: 57 mL/min/{1.73_m2} — ABNORMAL LOW (ref >59)
GLOBULIN, TOTAL: 2.3 g/dL (ref 1.5–4.5)
Glucose: 68 mg/dL (ref 65–99)
POTASSIUM: 4.5 mmol/L (ref 3.5–5.2)
SODIUM: 143 mmol/L (ref 134–144)
Total Protein: 6.3 g/dL (ref 6.0–8.5)

## 2015-10-14 MED ORDER — IBUPROFEN 800 MG PO TABS: 800.0000 mg | ORAL_TABLET | Freq: Three times a day (TID) | ORAL | Status: DC | PRN

## 2015-10-14 NOTE — Progress Notes (Signed)
BP 135/79 mmHg  Pulse 72  Temp(Src) 97.1 F (36.2 C) (Oral)  Ht 5\' 5"  (1.651 m)  Wt 157 lb 12.8 oz (71.578 kg)  BMI 26.26 kg/m2   Subjective:    Patient ID: , female    DOB: 1934-05-26, 81 y.o.   MRN: [id number]  HPI: [name] is a 81 y.o. female presenting on 10/14/2015 for Right ear pain   HPI Right ear pain and ringing in her ear. Patient comes in today with right ear pain and ringing in her ear. This started yesterday after a loud clap of thunder when off near her house and she felt like the sound hit her worse on the right side and she's been having this pain and ringing or ear since that time. She wants to come in today to make sure she didn't rupture anything with that we can't see anything wrong with it. She denies any fevers or chills or nasal congestion or cough. The ear pain is 5 out of 10. His not radiating anywhere else.  Relevant past medical, surgical, family and social history reviewed and updated as indicated. Interim medical history since our last visit reviewed. Allergies and medications reviewed and updated.  Review of Systems  Constitutional: Negative for fever and chills.  HENT: Positive for ear pain and tinnitus. Negative for congestion, ear discharge, hearing loss, nosebleeds, postnasal drip, rhinorrhea, sinus pressure and sore throat.   Eyes: Negative for redness and visual disturbance.  Respiratory: Negative for chest tightness and shortness of breath.   Cardiovascular: Negative for chest pain and leg swelling.  Genitourinary: Negative for dysuria and difficulty urinating.  Musculoskeletal: Negative for back pain and gait problem.  Skin: Negative for rash.  Neurological: Negative for light-headedness and headaches.  Psychiatric/Behavioral: Negative for behavioral problems and agitation.  All other systems reviewed and are negative.   Per HPI unless specifically indicated above     Medication List       This list is accurate  as of: 10/14/15 10:43 AM.  Always use your most recent med list.               albuterol 108 (90 Base) MCG/ACT inhaler  Commonly known as:  PROVENTIL HFA;VENTOLIN HFA  Inhale 2 puffs into the lungs every 6 (six) hours as needed for wheezing or shortness of breath.     amitriptyline 25 MG tablet  Commonly known as:  ELAVIL  Take 1 tablet (25 mg total) by mouth at bedtime.     aspirin EC 81 MG tablet  Take 81 mg by mouth daily.     budesonide-formoterol 160-4.5 MCG/ACT inhaler  Commonly known as:  SYMBICORT  Inhale 2 puffs into the lungs 2 (two) times daily.     buPROPion 150 MG 12 hr tablet  Commonly known as:  WELLBUTRIN SR  Take 1 tablet (150 mg total) by mouth 2 (two) times daily.     calcium citrate-vitamin D 315-200 MG-UNIT tablet  Commonly known as:  CITRACAL+D  Take 2 tablets by mouth daily.     carvedilol 6.25 MG tablet  Commonly known as:  COREG  Take 1 tablet (6.25 mg total) by mouth 2 (two) times daily with a meal.     cetirizine 10 MG tablet  Commonly known as:  ZYRTEC  Take 1 tablet (10 mg total) by mouth daily.     citalopram 10 MG tablet  Commonly known as:  CELEXA  Take 1 tablet (10 mg total) by mouth  daily.     donepezil 10 MG tablet  Commonly known as:  ARICEPT  Take 1 tablet (10 mg total) by mouth at bedtime.     esomeprazole 40 MG capsule  Commonly known as:  NEXIUM  Take 1 capsule (40 mg total) by mouth daily before breakfast.     fluticasone 50 MCG/ACT nasal spray  Commonly known as:  FLONASE  Place 2 sprays into both nostrils daily.     furosemide 20 MG tablet  Commonly known as:  LASIX  Take 0.5 tablets (10 mg total) by mouth daily.     ibuprofen 800 MG tablet  Commonly known as:  ADVIL,MOTRIN  Take 1 tablet (800 mg total) by mouth every 8 (eight) hours as needed.     losartan 50 MG tablet  Commonly known as:  COZAAR  Take 0.5 tablets (25 mg total) by mouth daily.     memantine 10 MG tablet  Commonly known as:  NAMENDA  Take 1  tablet (10 mg total) by mouth 2 (two) times daily.     raloxifene 60 MG tablet  Commonly known as:  EVISTA  Take 1 tablet (60 mg total) by mouth daily.           Objective:    BP 135/79 mmHg  Pulse 72  Temp(Src) 97.1 F (36.2 C) (Oral)  Ht 5\' 5"  (1.651 m)  Wt 157 lb 12.8 oz (71.578 kg)  BMI 26.26 kg/m2  Wt Readings from Last 3 Encounters:  10/14/15 157 lb 12.8 oz (71.578 kg)  10/13/15 160 lb 3.2 oz (72.666 kg)  10/05/15 160 lb 0.9 oz (72.6 kg)    Physical Exam  Constitutional: She is oriented to person, place, and time. She appears well-developed and well-nourished. No distress.  HENT:  Right Ear: External ear normal.  Left Ear: External ear normal.  Nose: Nose normal.  Mouth/Throat: Oropharynx is clear and moist. No oropharyngeal exudate.  Eyes: Conjunctivae and EOM are normal. Pupils are equal, round, and reactive to light.  Neck: Neck supple. No thyromegaly present.  Cardiovascular: Normal rate, regular rhythm, normal heart sounds and intact distal pulses.   No murmur heard. Pulmonary/Chest: Effort normal and breath sounds normal. No respiratory distress. She has no wheezes.  Musculoskeletal: Normal range of motion. She exhibits no edema or tenderness.  Lymphadenopathy:    She has no cervical adenopathy.  Neurological: She is alert and oriented to person, place, and time. Coordination normal.  Skin: Skin is warm and dry. No rash noted. She is not diaphoretic.  Psychiatric: She has a normal mood and affect. Her behavior is normal.  Nursing note and vitals reviewed.     Assessment & Plan:   Problem List Items Addressed This Visit    None    Visit Diagnoses    Tinnitus, right    -  Primary    Patient has acute tinnitus likely from a loud crack of thunder. Likely will resolve, if does not get better in a week and give 12/05/15 a call.    Relevant Medications    ibuprofen (ADVIL,MOTRIN) 800 MG tablet        Follow up plan: Return if symptoms worsen or fail to  improve.  Counseling provided for all of the vaccine components No orders of the defined types were placed in this encounter.    [place], MD [facility] 10/14/2015, 10:43 AM

## 2015-10-17 ENCOUNTER — Telehealth: Payer: Self-pay

## 2015-10-17 ENCOUNTER — Encounter: Payer: Self-pay | Admitting: Family Medicine

## 2015-10-17 ENCOUNTER — Ambulatory Visit (INDEPENDENT_AMBULATORY_CARE_PROVIDER_SITE_OTHER): Payer: Medicare Other

## 2015-10-17 ENCOUNTER — Ambulatory Visit (INDEPENDENT_AMBULATORY_CARE_PROVIDER_SITE_OTHER): Payer: Medicare Other | Admitting: Family Medicine

## 2015-10-17 VITALS — BP 134/81 | HR 81 | Temp 98.0°F | Ht 65.0 in | Wt 159.0 lb

## 2015-10-17 DIAGNOSIS — M545 Low back pain: Secondary | ICD-10-CM | POA: Diagnosis not present

## 2015-10-17 DIAGNOSIS — J309 Allergic rhinitis, unspecified: Secondary | ICD-10-CM

## 2015-10-17 DIAGNOSIS — W19XXXA Unspecified fall, initial encounter: Secondary | ICD-10-CM

## 2015-10-17 DIAGNOSIS — J449 Chronic obstructive pulmonary disease, unspecified: Secondary | ICD-10-CM

## 2015-10-17 DIAGNOSIS — M542 Cervicalgia: Secondary | ICD-10-CM

## 2015-10-17 NOTE — Progress Notes (Signed)
BP 134/81 mmHg  Pulse 81  Temp(Src) 98 F (36.7 C) (Oral)  Ht 5\' 5"  (1.651 m)  Wt 159 lb (72.122 kg)  BMI 26.46 kg/m2   Subjective:    Patient ID: , female    DOB: January 08, 1935, 81 y.o.   MRN: [id number]  HPI: [name] is a 81 y.o. female presenting on 10/17/2015 for Low back pain and bilateral shoulder pain   HPI Neck pain and upper back pain Patient comes in today because she had a fall yesterday where she was trying to get in the car and then fell backwards and landed on her outstretched arms and felt like she has pain in her neck and upper back. She has some low back pain but that's more chronic and not new from this fall. She mainly fell onto her hands and onto the side of her hip but felt like she torqued her neck pretty good. She denies any fevers or chills or numbness or weakness.  Relevant past medical, surgical, family and social history reviewed and updated as indicated. Interim medical history since our last visit reviewed. Allergies and medications reviewed and updated.  Review of Systems  Constitutional: Negative for fever and chills.  HENT: Negative for congestion, ear discharge and ear pain.   Eyes: Negative for redness and visual disturbance.  Respiratory: Negative for chest tightness and shortness of breath.   Cardiovascular: Negative for chest pain and leg swelling.  Genitourinary: Negative for dysuria and difficulty urinating.  Musculoskeletal: Positive for myalgias, back pain, arthralgias and neck pain. Negative for gait problem.  Skin: Negative for rash.  Neurological: Negative for light-headedness and headaches.  Psychiatric/Behavioral: Negative for behavioral problems and agitation.  All other systems reviewed and are negative.   Per HPI unless specifically indicated above     Medication List       This list is accurate as of: 10/17/15  4:58 PM.  Always use your most recent med list.               albuterol 108 (90 Base)  MCG/ACT inhaler  Commonly known as:  PROVENTIL HFA;VENTOLIN HFA  Inhale 2 puffs into the lungs every 6 (six) hours as needed for wheezing or shortness of breath.     amitriptyline 25 MG tablet  Commonly known as:  ELAVIL  Take 1 tablet (25 mg total) by mouth at bedtime.     aspirin EC 81 MG tablet  Take 81 mg by mouth daily.     budesonide-formoterol 160-4.5 MCG/ACT inhaler  Commonly known as:  SYMBICORT  Inhale 2 puffs into the lungs 2 (two) times daily.     buPROPion 150 MG 12 hr tablet  Commonly known as:  WELLBUTRIN SR  Take 1 tablet (150 mg total) by mouth 2 (two) times daily.     calcium citrate-vitamin D 315-200 MG-UNIT tablet  Commonly known as:  CITRACAL+D  Take 2 tablets by mouth daily.     carvedilol 6.25 MG tablet  Commonly known as:  COREG  Take 1 tablet (6.25 mg total) by mouth 2 (two) times daily with a meal.     cetirizine 10 MG tablet  Commonly known as:  ZYRTEC  Take 1 tablet (10 mg total) by mouth daily.     citalopram 10 MG tablet  Commonly known as:  CELEXA  Take 1 tablet (10 mg total) by mouth daily.     donepezil 10 MG tablet  Commonly known as:  ARICEPT  Take  1 tablet (10 mg total) by mouth at bedtime.     esomeprazole 40 MG capsule  Commonly known as:  NEXIUM  Take 1 capsule (40 mg total) by mouth daily before breakfast.     fluticasone 50 MCG/ACT nasal spray  Commonly known as:  FLONASE  Place 2 sprays into both nostrils daily.     furosemide 20 MG tablet  Commonly known as:  LASIX  Take 0.5 tablets (10 mg total) by mouth daily.     ibuprofen 800 MG tablet  Commonly known as:  ADVIL,MOTRIN  Take 1 tablet (800 mg total) by mouth every 8 (eight) hours as needed.     losartan 50 MG tablet  Commonly known as:  COZAAR  Take 0.5 tablets (25 mg total) by mouth daily.     memantine 10 MG tablet  Commonly known as:  NAMENDA  Take 1 tablet (10 mg total) by mouth 2 (two) times daily.     raloxifene 60 MG tablet  Commonly known as:  EVISTA   Take 1 tablet (60 mg total) by mouth daily.           Objective:    BP 134/81 mmHg  Pulse 81  Temp(Src) 98 F (36.7 C) (Oral)  Ht 5\' 5"  (1.651 m)  Wt 159 lb (72.122 kg)  BMI 26.46 kg/m2  Wt Readings from Last 3 Encounters:  10/17/15 159 lb (72.122 kg)  10/14/15 157 lb 12.8 oz (71.578 kg)  10/13/15 160 lb 3.2 oz (72.666 kg)    Physical Exam  Constitutional: She is oriented to person, place, and time. She appears well-developed and well-nourished. No distress.  Eyes: Conjunctivae and EOM are normal. Pupils are equal, round, and reactive to light.  Cardiovascular: Normal rate, regular rhythm, normal heart sounds and intact distal pulses.   No murmur heard. Pulmonary/Chest: Effort normal and breath sounds normal. No respiratory distress. She has no wheezes.  Musculoskeletal: Normal range of motion. She exhibits tenderness (Tenderness over cervical spine around C7 C8. Lumbar paraspinal tenderness but patient says that's chronic. No loss of range of motion or sensation). She exhibits no edema.  Neurological: She is alert and oriented to person, place, and time. She displays normal reflexes. No cranial nerve deficit. She exhibits normal muscle tone. Coordination normal.  Skin: Skin is warm and dry. No rash noted. She is not diaphoretic.  Psychiatric: She has a normal mood and affect. Her behavior is normal.  Nursing note and vitals reviewed.    Thoracic and cervical x-rays: No visible acute bony abnormalities, chronic degenerative changes, await final read by radiology    Assessment & Plan:   Problem List Items Addressed This Visit    None    Visit Diagnoses    Fall, initial encounter    -  Primary    Patient had fall and neck pain, will do x-rays    Relevant Orders    DG Thoracic Spine 2 View    DG Cervical Spine Complete        Follow up plan: Return if symptoms worsen or fail to improve.  Counseling provided for all of the vaccine components Orders Placed This  Encounter  Procedures  . DG Thoracic Spine 2 View  . DG Cervical Spine Complete    10/15/15, MD [facility] 10/17/2015, 4:58 PM

## 2015-10-17 NOTE — Telephone Encounter (Signed)
Patient needs refill of Xanax, please advise

## 2015-10-18 MED ORDER — CETIRIZINE HCL 10 MG PO TABS: 10.0000 mg | ORAL_TABLET | Freq: Every day | ORAL | Status: DC

## 2015-10-18 MED ORDER — ALPRAZOLAM 0.5 MG PO TABS: 0.5000 mg | ORAL_TABLET | Freq: Two times a day (BID) | ORAL | Status: DC | PRN

## 2015-10-18 MED ORDER — ALBUTEROL SULFATE HFA 108 (90 BASE) MCG/ACT IN AERS: 2.0000 | INHALATION_SPRAY | Freq: Four times a day (QID) | RESPIRATORY_TRACT | Status: DC | PRN

## 2015-10-18 NOTE — Telephone Encounter (Signed)
Refill Xanax called to [facility] pharmacy. In calling in refill pt is moving pharmacy's from [place] to [facility], [place] needs Rx for [employer], Vit D they are checking on with [facility] if just OTC med.

## 2015-10-20 ENCOUNTER — Telehealth: Payer: Self-pay | Admitting: Pediatrics

## 2015-10-20 NOTE — Telephone Encounter (Signed)
Patient's daughter called and app made to discuss meds for 10/26/15

## 2015-10-22 ENCOUNTER — Ambulatory Visit (INDEPENDENT_AMBULATORY_CARE_PROVIDER_SITE_OTHER): Payer: Medicare Other | Admitting: Family

## 2015-10-22 ENCOUNTER — Encounter: Payer: Self-pay | Admitting: Family

## 2015-10-22 VITALS — BP 134/74 | HR 58 | Temp 97.7°F | Ht 65.0 in | Wt 156.0 lb

## 2015-10-22 DIAGNOSIS — M542 Cervicalgia: Secondary | ICD-10-CM | POA: Diagnosis not present

## 2015-10-22 DIAGNOSIS — M62838 Other muscle spasm: Secondary | ICD-10-CM

## 2015-10-22 DIAGNOSIS — R252 Cramp and spasm: Secondary | ICD-10-CM

## 2015-10-22 MED ORDER — CYCLOBENZAPRINE HCL 5 MG PO TABS: 5.0000 mg | ORAL_TABLET | Freq: Three times a day (TID) | ORAL | Status: DC | PRN

## 2015-10-22 MED ORDER — DICLOFENAC SODIUM 1 % TD GEL: 2.0000 g | Freq: Four times a day (QID) | TRANSDERMAL | Status: DC

## 2015-10-22 NOTE — Patient Instructions (Signed)

## 2015-10-22 NOTE — Progress Notes (Addendum)
Subjective:    Patient ID: [name], female    DOB: [birth year], 81 y.o.   MRN: [id number]  Neck Pain  This is a chronic problem. The current episode started more than 1 month ago. The problem occurs intermittently. The problem has been unchanged. The pain is associated with nothing. The pain is present in the left side and right side. The quality of the pain is described as aching. The pain is at a severity of 7/10. The pain is moderate. The symptoms are aggravated by twisting. Pertinent negatives include no headaches, paresis, photophobia, syncope or tingling. She has tried heat, acetaminophen, ice and NSAIDs for the symptoms. The treatment provided mild relief.   PT presents to the office today "cramps" in her bilateral thighs, hands, and generalized abdomen. Pt states this started two days ago and has become worse. PT states she drinks 7-9 glasses of water a day and takes a daily multivitamin. PT states these muscle cramps can occur while you are laying down or walking. Pt states she has tried to massage "it out" and take a spoonful of mustard. PT states this seems to help a little.     Review of Systems  Constitutional: Negative.   HENT: Negative.   Eyes: Negative.  Negative for photophobia.  Respiratory: Negative.  Negative for shortness of breath.   Cardiovascular: Negative.  Negative for palpitations and syncope.  Gastrointestinal: Negative.   Endocrine: Negative.   Genitourinary: Negative.   Musculoskeletal: Positive for myalgias and neck pain.  Neurological: Negative.  Negative for tingling and headaches.  Hematological: Negative.   Psychiatric/Behavioral: Negative.   All other systems reviewed and are negative.      Objective:   Physical Exam  Constitutional: She is oriented to person, place, and time. She appears well-developed and well-nourished. No distress.  HENT:  Head: Normocephalic.  Eyes: Pupils are equal, round, and reactive to light.  Neck: Normal range  of motion. Neck supple. No thyromegaly present.  Cardiovascular: Normal rate, regular rhythm, normal heart sounds and intact distal pulses.   No murmur heard. Pulmonary/Chest: Effort normal and breath sounds normal. No respiratory distress. She has no wheezes.  Abdominal: Soft. Bowel sounds are normal. She exhibits no distension. There is no tenderness.  Musculoskeletal: Normal range of motion. She exhibits no edema or tenderness.  Neurological: She is alert and oriented to person, place, and time.  Skin: Skin is warm and dry.  Psychiatric: She has a normal mood and affect. Her behavior is normal. Judgment and thought content normal.  Vitals reviewed.  BP 134/74 mmHg  Pulse 58  Temp(Src) 97.7 F (36.5 C) (Oral)  Ht '5\' 5"'$  (1.651 m)  Wt 156 lb (70.761 kg)  BMI 25.96 kg/m2        Assessment & Plan:  1. Muscle spasm - CMP14+EGFR - Magnesium - cyclobenzaprine (FLEXERIL) 5 MG tablet; Take 1 tablet (5 mg total) by mouth 3 (three) times daily as needed for muscle spasms.  Dispense: 30 tablet; Refill: 1  2. Muscle cramps - CMP14+EGFR - Magnesium - cyclobenzaprine (FLEXERIL) 5 MG tablet; Take 1 tablet (5 mg total) by mouth 3 (three) times daily as needed for muscle spasms.  Dispense: 30 tablet; Refill: 1  3. Neck pain - diclofenac sodium (VOLTAREN) 1 % GEL; Apply 2 g topically 4 (four) times daily.  Dispense: 100 g; Refill: 3  Force fluids Labs pending Flexeril as needed Discussed to only take Lasix as needed for edema RTO prn  Evelina Dun, FNP

## 2015-10-22 NOTE — Addendum Note (Signed)
Addended by: Jannifer Rodney A on: 10/22/2015 10:03 AM   Modules accepted: Orders

## 2015-10-23 LAB — CMP14+EGFR
A/G RATIO: 1.9 (ref 1.2–2.2)
ALBUMIN: 4.4 g/dL (ref 3.5–4.8)
ALK PHOS: 97 IU/L (ref 39–117)
ALT: 24 IU/L (ref 0–32)
AST: 28 IU/L (ref 0–40)
BILIRUBIN TOTAL: 0.3 mg/dL (ref 0.0–1.2)
BUN/Creatinine Ratio: 10 — ABNORMAL LOW (ref 12–28)
BUN: 11 mg/dL (ref 8–27)
CHLORIDE: 103 mmol/L (ref 96–106)
CO2: 22 mmol/L (ref 18–29)
Calcium: 9.5 mg/dL (ref 8.7–10.3)
Creatinine, Ser: 1.06 mg/dL — ABNORMAL HIGH (ref 0.57–1.00)
GFR calc non Af Amer: 50 mL/min/{1.73_m2} — ABNORMAL LOW (ref >59)
GFR, EST AFRICAN AMERICAN: 58 mL/min/{1.73_m2} — AB (ref >59)
GLOBULIN, TOTAL: 2.3 g/dL (ref 1.5–4.5)
GLUCOSE: 85 mg/dL (ref 65–99)
POTASSIUM: 5.1 mmol/L (ref 3.5–5.2)
SODIUM: 142 mmol/L (ref 134–144)
TOTAL PROTEIN: 6.7 g/dL (ref 6.0–8.5)

## 2015-10-23 LAB — MAGNESIUM: MAGNESIUM: 2.2 mg/dL (ref 1.6–2.3)

## 2015-10-26 ENCOUNTER — Ambulatory Visit (INDEPENDENT_AMBULATORY_CARE_PROVIDER_SITE_OTHER): Payer: Medicare Other | Admitting: Pharmacist

## 2015-10-26 ENCOUNTER — Encounter: Payer: Self-pay | Admitting: Pharmacist

## 2015-10-26 VITALS — BP 102/62 | HR 90 | Ht 62.5 in | Wt 159.0 lb

## 2015-10-26 DIAGNOSIS — J309 Allergic rhinitis, unspecified: Secondary | ICD-10-CM

## 2015-10-26 DIAGNOSIS — Z0000 Encounter for general adult medical examination without abnormal findings: Secondary | ICD-10-CM | POA: Diagnosis not present

## 2015-10-26 MED ORDER — FLUTICASONE FUROATE-VILANTEROL 200-25 MCG/INH IN AEPB: 1.0000 | INHALATION_SPRAY | Freq: Every day | RESPIRATORY_TRACT | Status: DC

## 2015-10-26 MED ORDER — CETIRIZINE HCL 10 MG PO TABS: 10.0000 mg | ORAL_TABLET | Freq: Every day | ORAL | Status: DC

## 2015-10-26 NOTE — Patient Instructions (Addendum)
Ms. Hokenson , Thank you for taking time to come for your Medicare Wellness Visit. I appreciate your ongoing commitment to your health goals. Please review the following plan we discussed and let me know if I can assist you in the future.   These are the goals we discussed:  Try to do Chair exercises from handout I gave you.  Increase non-starchy vegetables - carrots, green bean, squash, zucchini, tomatoes, onions, peppers, spinach and other green leafy vegetables, cabbage, lettuce, cucumbers, asparagus, okra (not fried), eggplant Limit sugar and processed foods (cakes, cookies, ice cream, crackers and chips) Increase fresh fruit but limit serving sizes 1/2 cup or about the size of tennis or baseball Limit red meat to no more than 1-2 times per week (serving size about the size of your palm) Choose whole grains / lean proteins - whole wheat bread, quinoa, whole grain rice (1/2 cup), fish, chicken, [place] Avoid sugar and calorie containing beverages - soda, sweet tea and juice.  Choose water or unsweetened tea instead.  This is a list of the screening recommended for you and due dates:  Health Maintenance  Topic Date Due  . Flu Shot  11/29/2015  . DEXA scan (bone density measurement)  05/19/2016  . Tetanus Vaccine  11/28/2016  . Shingles Vaccine  Completed  . Pneumonia vaccines  Completed     Fall Prevention in the Home  Falls can cause injuries. They can happen to people of all ages. There are many things you can do to make your home safe and to help prevent falls.  WHAT CAN I DO ON THE OUTSIDE OF MY HOME?  Regularly fix the edges of walkways and driveways and fix any cracks.  Remove anything that might make you trip as you walk through a door, such as a raised step or threshold.  Trim any bushes or trees on the path to your home.  Use bright outdoor lighting.  Clear any walking paths of anything that might make someone trip, such as rocks or tools.  Regularly check to see if  handrails are loose or broken. Make sure that both sides of any steps have handrails.  Any raised decks and porches should have guardrails on the edges.  Have any leaves, snow, or ice cleared regularly.  Use sand or salt on walking paths during winter.  Clean up any spills in your garage right away. This includes oil or grease spills. WHAT CAN I DO IN THE BATHROOM?   Use night lights.  Install grab bars by the toilet and in the tub and shower. Do not use towel bars as grab bars.  Use non-skid mats or decals in the tub or shower.  If you need to sit down in the shower, use a plastic, non-slip stool.  Keep the floor dry. Clean up any water that spills on the floor as soon as it happens.  Remove soap buildup in the tub or shower regularly.  Attach bath mats securely with double-sided non-slip rug tape.  Do not have throw rugs and other things on the floor that can make you trip. WHAT CAN I DO IN THE BEDROOM?  Use night lights.  Make sure that you have a light by your bed that is easy to reach.  Do not use any sheets or blankets that are too big for your bed. They should not hang down onto the floor.  Have a firm chair that has side arms. You can use this for support while you get dressed.  Do not have throw rugs and other things on the floor that can make you trip. WHAT CAN I DO IN THE KITCHEN?  Clean up any spills right away.  Avoid walking on wet floors.  Keep items that you use a lot in easy-to-reach places.  If you need to reach something above you, use a strong step stool that has a grab bar.  Keep electrical cords out of the way.  Do not use floor polish or wax that makes floors slippery. If you must use wax, use non-skid floor wax.  Do not have throw rugs and other things on the floor that can make you trip. WHAT CAN I DO WITH MY STAIRS?  Do not leave any items on the stairs.  Make sure that there are handrails on both sides of the stairs and use them. Fix  handrails that are broken or loose. Make sure that handrails are as long as the stairways.  Check any carpeting to make sure that it is firmly attached to the stairs. Fix any carpet that is loose or worn.  Avoid having throw rugs at the top or bottom of the stairs. If you do have throw rugs, attach them to the floor with carpet tape.  Make sure that you have a light switch at the top of the stairs and the bottom of the stairs. If you do not have them, ask someone to add them for you. WHAT ELSE CAN I DO TO HELP PREVENT FALLS?  Wear shoes that:  Do not have high heels.  Have rubber bottoms.  Are comfortable and fit you well.  Are closed at the toe. Do not wear sandals.  If you use a stepladder:  Make sure that it is fully opened. Do not climb a closed stepladder.  Make sure that both sides of the stepladder are locked into place.  Ask someone to hold it for you, if possible.  Clearly mark and make sure that you can see:  Any grab bars or handrails.  First and last steps.  Where the edge of each step is.  Use tools that help you move around (mobility aids) if they are needed. These include:  Canes.  Walkers.  Scooters.  Crutches.  Turn on the lights when you go into a dark area. Replace any light bulbs as soon as they burn out.  Set up your furniture so you have a clear path. Avoid moving your furniture around.  If any of your floors are uneven, fix them.  If there are any pets around you, be aware of where they are.  Review your medicines with your doctor. Some medicines can make you feel dizzy. This can increase your chance of falling. Ask your doctor what other things that you can do to help prevent falls.   This information is not intended to replace advice given to you by your health care provider. Make sure you discuss any questions you have with your health care provider.   Document Released: 02/10/2009 Document Revised: 08/31/2014 Document Reviewed:  05/21/2014 Elsevier Interactive Patient Education 2016 [place] Maintenance, Female Adopting a healthy lifestyle and getting preventive care can go a long way to promote health and wellness. Talk with your health care provider about what schedule of regular examinations is right for you. This is a good chance for you to check in with your provider about disease prevention and staying healthy. In between checkups, there are plenty of things you can do on your own.  Experts have done a lot of research about which lifestyle changes and preventive measures are most likely to keep you healthy. Ask your health care provider for more information. WEIGHT AND DIET  Eat a healthy diet  Be sure to include plenty of vegetables, fruits, low-fat dairy products, and lean protein.  Do not eat a lot of foods high in solid fats, added sugars, or salt.  Get regular exercise. This is one of the most important things you can do for your health.  Most adults should exercise for at least 150 minutes each week. The exercise should increase your heart rate and make you sweat (moderate-intensity exercise).  Most adults should also do strengthening exercises at least twice a week. This is in addition to the moderate-intensity exercise.  Maintain a healthy weight  Body mass index (BMI) is a measurement that can be used to identify possible weight problems. It estimates body fat based on height and weight. Your health care provider can help determine your BMI and help you achieve or maintain a healthy weight.  For females 43 years of age and older:   A BMI below 18.5 is considered underweight.  A BMI of 18.5 to 24.9 is normal.  A BMI of 25 to 29.9 is considered overweight.  A BMI of 30 and above is considered obese.  Watch levels of cholesterol and blood lipids  You should start having your blood tested for lipids and cholesterol at 79 years of age, then have this test every 5 years.  You may  need to have your cholesterol levels checked more often if:  Your lipid or cholesterol levels are high.  You are older than 79 years of age.  You are at high risk for heart disease.  CANCER SCREENING   Lung Cancer  Lung cancer screening is recommended for adults 49-20 years old who are at high risk for lung cancer because of a history of smoking.  A yearly low-dose CT scan of the lungs is recommended for people who:  Currently smoke.  Have quit within the past 15 years.  Have at least a 30-pack-year history of smoking. A pack year is smoking an average of one pack of cigarettes a day for 1 year.  Yearly screening should continue until it has been 15 years since you quit.  Yearly screening should stop if you develop a health problem that would prevent you from having lung cancer treatment.  Breast Cancer  Practice breast self-awareness. This means understanding how your breasts normally appear and feel.  It also means doing regular breast self-exams. Let your health care provider know about any changes, no matter how small.  If you are in your 20s or 30s, you should have a clinical breast exam (CBE) by a health care provider every 1-3 years as part of a regular health exam.  If you are 50 or older, have a CBE every year. Also consider having a breast X-ray (mammogram) every year.  If you have a family history of breast cancer, talk to your health care provider about genetic screening.  If you are at high risk for breast cancer, talk to your health care provider about having an MRI and a mammogram every year.  Breast cancer gene (BRCA) assessment is recommended for women who have family members with BRCA-related cancers. BRCA-related cancers include:  Breast.  Ovarian.  Tubal.  Peritoneal cancers.  Results of the assessment will determine the need for genetic counseling and BRCA1 and BRCA2 testing. Cervical Cancer  Your health care provider may recommend that you be  screened regularly for cancer of the pelvic organs (ovaries, uterus, and vagina). This screening involves a pelvic examination, including checking for microscopic changes to the surface of your cervix (Pap test). You may be encouraged to have this screening done every 3 years, beginning at age 52.  For women ages 65-65, health care providers may recommend pelvic exams and Pap testing every 3 years, or they may recommend the Pap and pelvic exam, combined with testing for human papilloma virus (HPV), every 5 years. Some types of HPV increase your risk of cervical cancer. Testing for HPV may also be done on women of any age with unclear Pap test results.  Other health care providers may not recommend any screening for nonpregnant women who are considered low risk for pelvic cancer and who do not have symptoms. Ask your health care provider if a screening pelvic exam is right for you.  If you have had past treatment for cervical cancer or a condition that could lead to cancer, you need Pap tests and screening for cancer for at least 20 years after your treatment. If Pap tests have been discontinued, your risk factors (such as having a new sexual partner) need to be reassessed to determine if screening should resume. Some women have medical problems that increase the chance of getting cervical cancer. In these cases, your health care provider may recommend more frequent screening and Pap tests. Colorectal Cancer  This type of cancer can be detected and often prevented.  Routine colorectal cancer screening usually begins at 79 years of age and continues through 79 years of age.  Your health care provider may recommend screening at an earlier age if you have risk factors for colon cancer.  Your health care provider may also recommend using home test kits to check for hidden blood in the stool.  A small camera at the end of a tube can be used to examine your colon directly (sigmoidoscopy or colonoscopy).  This is done to check for the earliest forms of colorectal cancer.  Routine screening usually begins at age 72.  Direct examination of the colon should be repeated every 5-10 years through 79 years of age. However, you may need to be screened more often if early forms of precancerous polyps or small growths are found. Skin Cancer  Check your skin from head to toe regularly.  Tell your health care provider about any new moles or changes in moles, especially if there is a change in a mole's shape or color.  Also tell your health care provider if you have a mole that is larger than the size of a pencil eraser.  Always use sunscreen. Apply sunscreen liberally and repeatedly throughout the day.  Protect yourself by wearing long sleeves, pants, a wide-brimmed hat, and sunglasses whenever you are outside. HEART DISEASE, DIABETES, AND HIGH BLOOD PRESSURE   High blood pressure causes heart disease and increases the risk of stroke. High blood pressure is more likely to develop in:  People who have blood pressure in the high end of the normal range (130-139/85-89 mm Hg).  People who are overweight or obese.  People who are African American.  If you are 93-75 years of age, have your blood pressure checked every 3-5 years. If you are 79 years of age or older, have your blood pressure checked every year. You should have your blood pressure measured twice--once when you are at a hospital or clinic, and once  when you are not at a hospital or clinic. Record the average of the two measurements. To check your blood pressure when you are not at a hospital or clinic, you can use:  An automated blood pressure machine at a pharmacy.  A home blood pressure monitor.  If you are between 45 years and 78 years old, ask your health care provider if you should take aspirin to prevent strokes.  Have regular diabetes screenings. This involves taking a blood sample to check your fasting blood sugar level.  If you  are at a normal weight and have a low risk for diabetes, have this test once every three years after 79 years of age.  If you are overweight and have a high risk for diabetes, consider being tested at a younger age or more often. PREVENTING INFECTION  Hepatitis B  If you have a higher risk for hepatitis B, you should be screened for this virus. You are considered at high risk for hepatitis B if:  You were born in a country where hepatitis B is common. Ask your health care provider which countries are considered high risk.  Your parents were born in a high-risk country, and you have not been immunized against hepatitis B (hepatitis B vaccine).  You have HIV or AIDS.  You use needles to inject street drugs.  You live with someone who has hepatitis B.  You have had sex with someone who has hepatitis B.  You get hemodialysis treatment.  You take certain medicines for conditions, including cancer, organ transplantation, and autoimmune conditions. Hepatitis C  Blood testing is recommended for:  Everyone born from [birth year] through 1963.  Anyone with known risk factors for hepatitis C. Sexually transmitted infections (STIs)  You should be screened for sexually transmitted infections (STIs) including gonorrhea and chlamydia if:  You are sexually active and are younger than 79 years of age.  You are older than 79 years of age and your health care provider tells you that you are at risk for this type of infection.  Your sexual activity has changed since you were last screened and you are at an increased risk for chlamydia or gonorrhea. Ask your health care provider if you are at risk.  If you do not have HIV, but are at risk, it may be recommended that you take a prescription medicine daily to prevent HIV infection. This is called pre-exposure prophylaxis (PrEP). You are considered at risk if:  You are sexually active and do not regularly use condoms or know the HIV status of your  partner(s).  You take drugs by injection.  You are sexually active with a partner who has HIV. Talk with your health care provider about whether you are at high risk of being infected with HIV. If you choose to begin PrEP, you should first be tested for HIV. You should then be tested every 3 months for as long as you are taking PrEP.  PREGNANCY   If you are premenopausal and you may become pregnant, ask your health care provider about preconception counseling.  If you may become pregnant, take 400 to 800 micrograms (mcg) of folic acid every day.  If you want to prevent pregnancy, talk to your health care provider about birth control (contraception). OSTEOPOROSIS AND MENOPAUSE   Osteoporosis is a disease in which the bones lose minerals and strength with aging. This can result in serious bone fractures. Your risk for osteoporosis can be identified using a bone density scan.  If  you are 43 years of age or older, or if you are at risk for osteoporosis and fractures, ask your health care provider if you should be screened.  Ask your health care provider whether you should take a calcium or vitamin D supplement to lower your risk for osteoporosis.  Menopause may have certain physical symptoms and risks.  Hormone replacement therapy may reduce some of these symptoms and risks. Talk to your health care provider about whether hormone replacement therapy is right for you.  HOME CARE INSTRUCTIONS   Schedule regular health, dental, and eye exams.  Stay current with your immunizations.   Do not use any tobacco products including cigarettes, chewing tobacco, or electronic cigarettes.  If you are pregnant, do not drink alcohol.  If you are breastfeeding, limit how much and how often you drink alcohol.  Limit alcohol intake to no more than 1 drink per day for nonpregnant women. One drink equals 12 ounces of beer, 5 ounces of wine, or 1 ounces of hard liquor.  Do not use street drugs.  Do  not share needles.  Ask your health care provider for help if you need support or information about quitting drugs.  Tell your health care provider if you often feel depressed.  Tell your health care provider if you have ever been abused or do not feel safe at home.   This information is not intended to replace advice given to you by your health care provider. Make sure you discuss any questions you have with your health care provider.   Document Released: 10/30/2010 Document Revised: 05/07/2014 Document Reviewed: 03/18/2013 Elsevier Interactive Patient Education 2016 [employer].  Steps to Quit Smoking  Smoking tobacco can be harmful to your health and can affect almost every organ in your body. Smoking puts you, and those around you, at risk for developing many serious chronic diseases. Quitting smoking is difficult, but it is one of the best things that you can do for your health. It is never too late to quit. WHAT ARE THE BENEFITS OF QUITTING SMOKING? When you quit smoking, you lower your risk of developing serious diseases and conditions, such as:  Lung cancer or lung disease, such as COPD.  Heart disease.  Stroke.  Heart attack.  Infertility.  Osteoporosis and bone fractures. Additionally, symptoms such as coughing, wheezing, and shortness of breath may get better when you quit. You may also find that you get sick less often because your body is stronger at fighting off colds and infections. If you are pregnant, quitting smoking can help to reduce your chances of having a baby of low birth weight. HOW DO I GET READY TO QUIT? When you decide to quit smoking, create a plan to make sure that you are successful. Before you quit:  Pick a date to quit. Set a date within the next two weeks to give you time to prepare.  Write down the reasons why you are quitting. Keep this list in places where you will see it often, such as on your bathroom mirror or in your car or  wallet.  Identify the people, places, things, and activities that make you want to smoke (triggers) and avoid them. Make sure to take these actions:  Throw away all cigarettes at home, at work, and in your car.  Throw away smoking accessories, such as Scientist, research (medical).  Clean your car and make sure to empty the ashtray.  Clean your home, including curtains and carpets.  Tell your family,  friends, and coworkers that you are quitting. Support from your loved ones can make quitting easier.  Talk with your health care provider about your options for quitting smoking.  Find out what treatment options are covered by your health insurance. WHAT STRATEGIES CAN I USE TO QUIT SMOKING?  Talk with your healthcare provider about different strategies to quit smoking. Some strategies include:  Quitting smoking altogether instead of gradually lessening how much you smoke over a period of time. Research shows that quitting "cold [place]" is more successful than gradually quitting.  Attending in-person counseling to help you build problem-solving skills. You are more likely to have success in quitting if you attend several counseling sessions. Even short sessions of 10 minutes can be effective.  Finding resources and support systems that can help you to quit smoking and remain smoke-free after you quit. These resources are most helpful when you use them often. They can include:  Online chats with a Social worker.  Telephone quitlines.  Printed Furniture conservator/restorer.  Support groups or group counseling.  Text messaging programs.  Mobile phone applications.  Taking medicines to help you quit smoking. (If you are pregnant or breastfeeding, talk with your health care provider first.) Some medicines contain nicotine and some do not. Both types of medicines help with cravings, but the medicines that include nicotine help to relieve withdrawal symptoms. Your health care provider may recommend:  Nicotine  patches, gum, or lozenges.  Nicotine inhalers or sprays.  Non-nicotine medicine that is taken by mouth. Talk with your health care provider about combining strategies, such as taking medicines while you are also receiving in-person counseling. Using these two strategies together makes you more likely to succeed in quitting than if you used either strategy on its own. If you are pregnant or breastfeeding, talk with your health care provider about finding counseling or other support strategies to quit smoking. Do not take medicine to help you quit smoking unless told to do so by your health care provider. WHAT THINGS CAN I DO TO MAKE IT EASIER TO QUIT? Quitting smoking might feel overwhelming at first, but there is a lot that you can do to make it easier. Take these important actions:  Reach out to your family and friends and ask that they support and encourage you during this time. Call telephone quitlines, reach out to support groups, or work with a counselor for support.  Ask people who smoke to avoid smoking around you.  Avoid places that trigger you to smoke, such as bars, parties, or smoke-break areas at work.  Spend time around people who do not smoke.  Lessen stress in your life, because stress can be a smoking trigger for some people. To lessen stress, try:  Exercising regularly.  Deep-breathing exercises.  Yoga.  Meditating.  Performing a body scan. This involves closing your eyes, scanning your body from head to toe, and noticing which parts of your body are particularly tense. Purposefully relax the muscles in those areas.  Download or purchase mobile phone or tablet apps (applications) that can help you stick to your quit plan by providing reminders, tips, and encouragement. There are many free apps, such as QuitGuide from the [employer] Office manager for Disease Control and Prevention). You can find other support for quitting smoking (smoking cessation) through smokefree.gov and other  websites. HOW WILL I FEEL WHEN I QUIT SMOKING? Within the first 24 hours of quitting smoking, you may start to feel some withdrawal symptoms. These symptoms are usually most noticeable 2-3  days after quitting, but they usually do not last beyond 2-3 weeks. Changes or symptoms that you might experience include:  Mood swings.  Restlessness, anxiety, or irritation.  Difficulty concentrating.  Dizziness.  Strong cravings for sugary foods in addition to nicotine.  Mild weight gain.  Constipation.  Nausea.  Coughing or a sore throat.  Changes in how your medicines work in your body.  A depressed mood.  Difficulty sleeping (insomnia). After the first 2-3 weeks of quitting, you may start to notice more positive results, such as:  Improved sense of smell and taste.  Decreased coughing and sore throat.  Slower heart rate.  Lower blood pressure.  Clearer skin.  The ability to breathe more easily.  Fewer sick days. Quitting smoking is very challenging for most people. Do not get discouraged if you are not successful the first time. Some people need to make many attempts to quit before they achieve long-term success. Do your best to stick to your quit plan, and talk with your health care provider if you have any questions or concerns.   This information is not intended to replace advice given to you by your health care provider. Make sure you discuss any questions you have with your health care provider.   Document Released: 04/10/2001 Document Revised: 08/31/2014 Document Reviewed: 08/31/2014 Elsevier Interactive Patient Education [employer].

## 2015-10-26 NOTE — Progress Notes (Signed)
Subjective:   [name] is a 81 y.o. female who presents for a subsequent Medicare Annual Wellness Visit. I was also asked by per daughter to reviewed her medications.  [name] is planning to start getting meds packaged by [facility] to help with compliance and they want to ensure that her med list is UTD.   [name] lives along in [place].  She has 3 daughters who help with medical care and home care though 2 live near [place] and the other in [place] about 30 minutes away.  Review of Systems  Review of Systems  Constitutional: Negative.   HENT: Negative.   Eyes: Negative.   Respiratory: Negative.   Cardiovascular: Negative.   Gastrointestinal: Negative.   Genitourinary:       Dry mouth    Musculoskeletal: Positive for joint pain.  Skin: Negative.   Neurological: Negative.   Endo/Heme/Allergies: Negative.   Psychiatric/Behavioral: Positive for memory loss.      Current Medications (verified) Outpatient Encounter Prescriptions as of 10/26/2015  Medication Sig  . albuterol (PROVENTIL HFA;VENTOLIN HFA) 108 (90 Base) MCG/ACT inhaler Inhale 2 puffs into the lungs every 6 (six) hours as needed for wheezing or shortness of breath.  Marland Kitchen aspirin EC 81 MG tablet Take 81 mg by mouth daily.  . Biotin 5000 MCG CAPS Take 1 capsule by mouth daily.  Marland Kitchen buPROPion (WELLBUTRIN SR) 150 MG 12 hr tablet Take 1 tablet (150 mg total) by mouth 2 (two) times daily.  . carvedilol (COREG) 6.25 MG tablet Take 1 tablet (6.25 mg total) by mouth 2 (two) times daily with a meal.  . cetirizine (ZYRTEC) 10 MG tablet Take 1 tablet (10 mg total) by mouth at bedtime.  . Cyanocobalamin (VITAMIN B 12 PO) Take 1,000 mcg by mouth daily.  . cyclobenzaprine (FLEXERIL) 5 MG tablet Take 1 tablet (5 mg total) by mouth 3 (three) times daily as needed for muscle spasms.  Marland Kitchen donepezil (ARICEPT) 10 MG tablet Take 1 tablet (10 mg total) by mouth every evening. With food  . esomeprazole (NEXIUM) 40 MG  capsule Take 1 capsule (40 mg total) by mouth daily before breakfast.  . fluticasone (FLONASE) 50 MCG/ACT nasal spray Place 2 sprays into both nostrils daily.  . furosemide (LASIX) 20 MG tablet Take 0.5 tablets (10 mg total) by mouth daily.  Marland Kitchen ibuprofen (ADVIL,MOTRIN) 800 MG tablet Take 1 tablet (800 mg total) by mouth every 8 (eight) hours as needed.  Marland Kitchen losartan (COZAAR) 50 MG tablet Take 0.5 tablets (25 mg total) by mouth daily.  . memantine (NAMENDA) 10 MG tablet Take 1 tablet (10 mg total) by mouth 2 (two) times daily.  . Multiple Vitamins-Minerals (CENTRUM SILVER PO) Take 1 tablet by mouth daily.  . raloxifene (EVISTA) 60 MG tablet Take 1 tablet (60 mg total) by mouth daily.  . [DISCONTINUED] budesonide-formoterol (SYMBICORT) 160-4.5 MCG/ACT inhaler Inhale 2 puffs into the lungs 2 (two) times daily.  . [DISCONTINUED] cetirizine (ZYRTEC) 10 MG tablet Take 1 tablet (10 mg total) by mouth daily. (Patient taking differently: Take 10 mg by mouth at bedtime. )  . [DISCONTINUED] citalopram (CELEXA) 10 MG tablet Take 1 tablet (10 mg total) by mouth daily.  . [DISCONTINUED] donepezil (ARICEPT) 10 MG tablet Take 1 tablet (10 mg total) by mouth at bedtime.  . ALPRAZolam (XANAX) 0.5 MG tablet Take 1 tablet (0.5 mg total) by mouth 2 (two) times daily as needed for anxiety.  . fluticasone furoate-vilanterol (BREO ELLIPTA) 200-25 MCG/INH AEPB  Inhale 1 puff into the lungs daily.  . [DISCONTINUED] amitriptyline (ELAVIL) 25 MG tablet Take 1 tablet (25 mg total) by mouth at bedtime. (Patient not taking: Reported on 10/26/2015)  . [DISCONTINUED] calcium citrate-vitamin D (CITRACAL+D) 315-200 MG-UNIT per tablet Take 2 tablets by mouth daily. Reported on 10/26/2015  . [DISCONTINUED] diclofenac sodium (VOLTAREN) 1 % GEL Apply 2 g topically 4 (four) times daily. (Patient not taking: Reported on 10/26/2015)   No facility-administered encounter medications on file as of 10/26/2015.    Allergies  (verified) Lisinopril-hydrochlorothiazide; Codeine; and Penicillins   History: Past Medical History  Diagnosis Date  . Essential hypertension, benign   . Mixed hyperlipidemia   . Depression   . Allergic rhinitis   . GERD (gastroesophageal reflux disease)   . Arthritis   . History of blood transfusion   . Left bundle branch block   . History of cardiac catheterization     Normal coronaries 2010  . Secondary cardiomyopathy (HCC)     LVEF 40-45%, likely nonischemic  . Anxiety   . Osteopenia   . Cataract     had eye surgery  . Memory loss    Past Surgical History  Procedure Laterality Date  . Cholecystectomy  1991  . Tonsillectomy    . Appendectomy    . Back surgery  07/21/2008  . Rotator cuff repair  2011    right  . Cataract extraction w/phaco  04/16/2011    Procedure: CATARACT EXTRACTION PHACO AND INTRAOCULAR LENS PLACEMENT (IOC);  Surgeon: Susa Simmonds;  Location: [facility];  Service: Ophthalmology;  Laterality: Left;  CDE=11.35  . Hammer toe surgery  11/22/11    [facility], [name]  . Cataract extraction w/phaco  01/28/2012    Procedure: CATARACT EXTRACTION PHACO AND INTRAOCULAR LENS PLACEMENT (IOC);  Surgeon: Susa Simmonds, MD;  Location: [facility];  Service: Ophthalmology;  Laterality: Right;  CDI:8.15  . Cysto with hydrodistension  05/29/2012    Procedure: CYSTOSCOPY/HYDRODISTENSION;  Surgeon: Martina Sinner, MD;  Location: [facility];  Service: Urology;  Laterality: N/A;  INSTILLATION OF MARCAINE AND PYRIDIUM   . Lumbar laminectomy/decompression microdiscectomy Left 06/06/2012    Procedure: LUMBAR LAMINECTOMY/DECOMPRESSION MICRODISCECTOMY 1 LEVEL;  Surgeon: Maeola Harman, MD;  Location: [facility];  Service: Neurosurgery;  Laterality: Left;  Left Lumbar two-three Laminectomy for resection of synovial cyst  . Total hip arthroplasty Right 03/13/2013    Procedure: TOTAL HIP ARTHROPLASTY;  Surgeon: Nestor Lewandowsky, MD;  Location: [facility];  Service: Orthopedics;   Laterality: Right;  . Eye surgery Bilateral     catar  . Abdominal hysterectomy      complete  . Spine surgery      back suregry 2011  . Joint replacement      right hip replacement 2013   Family History  Problem Relation Age of Onset  . Colon cancer Neg Hx   . Anesthesia problems Neg Hx   . Hypotension Neg Hx   . Malignant hyperthermia Neg Hx   . Pseudochol deficiency Neg Hx   . Diabetes Other   . Allergies Other   . Diabetes Father   . Diabetes Brother   . Cancer Brother 84    colon  . Diabetes Brother   . Diabetes Brother   . Hypertension Sister   . Diabetes Sister   . Diabetes Sister    Social History   Occupational History  . 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  Part time care giver    Social History Main Topics  . Smoking status: Current Every Day Smoker -- 0.10 packs/day for 45 years    Types: Cigarettes  . Smokeless tobacco: Never Used     Comment: smokes 2 cigarettes a day  . Alcohol Use: No  . Drug Use: No  . Sexual Activity: No    Do you feel safe at home?  Yes Are there smokers in your home (other than you)? No  Dietary issues and exercise activities: Current Exercise Habits: The patient does not participate in regular exercise at present  Current Dietary habits:  Patient is lactose intolerance so she tried to avoid milk containing products.   Objective:    Today's Vitals   10/26/15 1550  BP: 102/62  Pulse: 90  Height: 5' 2.5" (1.588 m)  Weight: 159 lb (72.122 kg)  PainSc: 0-No pain   Body mass index is 28.6 kg/(m^2).  Activities of Daily Living In your present state of health, do you have any difficulty performing the following activities: 10/26/2015 10/05/2015  Hearing? N N  Vision? N N  Difficulty concentrating or making decisions? N N  Walking or climbing stairs? N Y  Dressing or bathing? N N  Doing errands, shopping? N Y  Quarry manager and eating ? N -  Using the Toilet? N -  In the past six months,  have you accidently leaked urine? N -  Do you have problems with loss of bowel control? N -  Managing your Medications? Y -  Managing your Finances? N -  Housekeeping or managing your Housekeeping? N -     Cardiac Risk Factors include: advanced age (>23men, >56 women);dyslipidemia;hypertension;sedentary lifestyle;smoking/ tobacco exposure  Depression Screen PHQ 2/9 Scores 10/26/2015 10/22/2015 10/14/2015 09/30/2015  PHQ - 2 Score 0 0 0 0  PHQ- 9 Score - - - -     Fall Risk Fall Risk  10/26/2015 10/17/2015 10/14/2015 09/30/2015 09/23/2015  Falls in the past year? Yes Yes No No No  Number falls in past yr: 1 1 - - -  Injury with Fall? No No - - -  Follow up Falls prevention discussed - - - -    Cognitive Function: MMSE - Mini Mental State Exam 10/26/2015 09/13/2015 08/09/2015 08/09/2014 05/18/2013  Orientation to time 2 4 4 5 5   Orientation to Place 5 5 3 5 5   Registration 3 3 3 3 3   Attention/ Calculation 3 1 1 5 1   Recall 2 1 2 2 3   Language- name 2 objects 2 2 2 2 2   Language- repeat 1 1 1 1 1   Language- follow 3 step command 3 3 3 3 3   Language- read & follow direction 1 1 1 1 1   Write a sentence 1 1 1 1 1   Copy design 0 0 1 0 1  Total score 23 22 22 28 26     Immunizations and Health Maintenance Immunization History  Administered Date(s) Administered  . Influenza Split 01/29/2011, 01/29/2012, 01/26/2013  . Influenza, High Dose Seasonal PF 02/15/2015  . Influenza,inj,Quad PF,36+ Mos 12/07/2013  . Pneumococcal Conjugate-13 07/07/2014  . Pneumococcal Polysaccharide-23 01/28/2010  . Zoster 10/25/2011   There are no preventive care reminders to display for this patient.  Patient Care Team: , MD as PCP - General (Pediatrics) , MD (Gastroenterology) , MD as Consulting Physician (Cardiology) 03/31/2011, MD as Consulting Physician (Urology) 03/30/2012, MD as Consulting Physician (Pulmonary Disease)  Indicate any  recent Medical  Services you may have received from other than [facility] providers in the past year (date may be approximate).    Assessment:    Annual Wellness Visit    Screening Tests Health Maintenance  Topic Date Due  . INFLUENZA VACCINE  11/29/2015  . DEXA SCAN  05/19/2016  . TETANUS/TDAP  11/28/2016  . ZOSTAVAX  Completed  . PNA vac Low Risk Adult  Completed        Plan:   During the course of the visit Jeanise was educated and counseled about the following appropriate screening and preventive services:    Vaccines to include Pneumoccal, Influenza, Hepatitis B, Td, Zostavax - UTD  Colorectal cancer screening - UTD  Cardiovascular disease screening - last EKG 01/2015; lipds at goal and taking statin; BP at goal today  Diabetes screening - UTD  Bone Denisty / Osteoporosis Screening - UTD - next due 01/20178  Mammogram - UTD  Spent 30 minutes reviewing medications.  The following changes were made.   D/c amitriptyline - can increase falls and cause dry mouth which patient is complaining of.  D/C celexa - patient has recently started wellbutrin XL 150 mg bid to help with smoking cessation and mood.    D/C volteran gel - not covered by insurance  Change Symbicort to [facility] due to ease of use (1 inhalation per day vs 2 q 12h).  Demonstrated proper use of new inhaler and patient received first dose in off.  Rx sent to pharmacy.   Also faxed pharmacy updated list of her current medications.  Glaucoma screening-Recommended patient make appt with Dr. Lita Mains.  Nutrition counseling - Increase non-starchy vegetables - carrots, green bean, squash, zucchini, tomatoes, onions, peppers, spinach and other green leafy vegetables, cabbage, lettuce, cucumbers, asparagus, okra (not fried), eggplant. limit sugar and processed foods (cakes, cookies, ice cream, crackers and chips. Increase fresh fruit but limit serving sizes 1/2 cup or about the size of tennis or baseball. Limit red meat to no more than  1-2 times per week (serving size about the size of your palm). Choose whole grains / lean proteins - whole wheat bread, quinoa, whole grain rice (1/2 cup), fish, chicken, [place]  Smoking cessation counseling - patient has decreased significantly over the last few months. Continue bupropion (will also help with depression) Rx given to patient today  Advanced Directives - packet given to patient.   Gave hand out and reviewed chair exercises with patient.  Also encouraged either walking or silver sneakers program.  Fall preventions discussed.    Patient Instructions (the written plan) were given to the patient.   Henrene Pastor, [facility]   10/27/2015

## 2015-10-28 ENCOUNTER — Encounter: Payer: Self-pay | Admitting: Nurse Practitioner

## 2015-10-28 ENCOUNTER — Ambulatory Visit (INDEPENDENT_AMBULATORY_CARE_PROVIDER_SITE_OTHER): Payer: Medicare Other | Admitting: Nurse Practitioner

## 2015-10-28 ENCOUNTER — Telehealth: Payer: Self-pay | Admitting: Cardiology

## 2015-10-28 VITALS — BP 129/74 | HR 82 | Temp 97.3°F | Ht 62.5 in | Wt 158.2 lb

## 2015-10-28 DIAGNOSIS — R110 Nausea: Secondary | ICD-10-CM

## 2015-10-28 DIAGNOSIS — R5382 Chronic fatigue, unspecified: Secondary | ICD-10-CM | POA: Diagnosis not present

## 2015-10-28 DIAGNOSIS — R350 Frequency of micturition: Secondary | ICD-10-CM

## 2015-10-28 LAB — URINALYSIS, COMPLETE
BILIRUBIN UA: NEGATIVE
GLUCOSE, UA: NEGATIVE
KETONES UA: NEGATIVE
NITRITE UA: NEGATIVE
PROTEIN UA: NEGATIVE
SPEC GRAV UA: 1.01 (ref 1.005–1.030)
UUROB: 0.2 mg/dL (ref 0.2–1.0)
pH, UA: 7.5 (ref 5.0–7.5)

## 2015-10-28 LAB — MICROSCOPIC EXAMINATION

## 2015-10-28 MED ORDER — ONDANSETRON 4 MG PO TBDP: 4.0000 mg | ORAL_TABLET | Freq: Three times a day (TID) | ORAL | Status: DC | PRN

## 2015-10-28 NOTE — Telephone Encounter (Signed)
Returned patient's daughter's call. She states pt has for past couple months, been having various complaints/issues, including SOB, weakness, diarrhea. Has been followed by PCP office and seen several times. Daughter notes she set pt up to see PA later next month bc patient missed f/u w Dr. Antoine Poche in April - due for yearly eval. She also thinks pt would benefit from cardiology assessment. Pt on sched to see Jacolyn Reedy on 7/24.  Daughter thinks pt may have an issue of med compliance, she is not using inhaler all the time, may not be taking other meds correctly, etc. Pt has hx of dementia. Daughter lives in [place] and aware pt had appt today w [facility]. Patient is currently at this appt. Physician notes are pending.   Dr. Antoine Poche out of office for week. Dr. Rennis Golden covering [facility] today, Monday.  Regarding SOB, I explained that since pt currently at [facility] Med visit, this can be assessed - if it warrants more urgent eval by cardiology they will likely notify us. She voiced understanding.  Will route to Dr. Rennis Golden for review.

## 2015-10-28 NOTE — Telephone Encounter (Signed)
I agree . If they need [place], try to get into [facility] sooner.  Dr. Rexene Edison

## 2015-10-28 NOTE — Telephone Encounter (Signed)
New message    Pt c/o Shortness Of Breath: STAT if SOB developed within the last 24 hours or pt is noticeably SOB on the phone  1. Are you currently SOB (can you hear that pt is SOB on the phone)? Pt daughter states not currently   2. How long have you been experiencing SOB? Pt daughter states a couple of weeks   3. Are you SOB when sitting or when up moving around? Moving around   4. Are you currently experiencing any other symptoms? Pt daughter states pt has been weak, no energy   Comment: pt daughter wants pt to be seen sooner than set appt please call back to discuss

## 2015-10-28 NOTE — Patient Instructions (Signed)
Nausea, Adult °Nausea is the feeling that you have an upset stomach or have to vomit. Nausea by itself is not likely a serious concern, but it may be an early sign of more serious medical problems. As nausea gets worse, it can lead to vomiting. If vomiting develops, there is the risk of dehydration.  °CAUSES  °· Viral infections. °· Food poisoning. °· Medicines. °· Pregnancy. °· Motion sickness. °· Migraine headaches. °· Emotional distress. °· Severe pain from any source. °· Alcohol intoxication. °HOME CARE INSTRUCTIONS °· Get plenty of rest. °· Ask your caregiver about specific rehydration instructions. °· Eat small amounts of food and sip liquids more often. °· Take all medicines as told by your caregiver. °SEEK MEDICAL CARE IF: °· You have not improved after 2 days, or you get worse. °· You have a headache. °SEEK IMMEDIATE MEDICAL CARE IF:  °· You have a fever. °· You faint. °· You keep vomiting or have blood in your vomit. °· You are extremely weak or dehydrated. °· You have dark or bloody stools. °· You have severe chest or abdominal pain. °MAKE SURE YOU: °· Understand these instructions. °· Will watch your condition. °· Will get help right away if you are not doing well or get worse. °  °This information is not intended to replace advice given to you by your health care provider. Make sure you discuss any questions you have with your health care provider. °  °Document Released: 05/24/2004 Document Revised: 05/07/2014 Document Reviewed: 12/27/2010 °Elsevier Interactive Patient Education ©2016 Else[employer] Inc[employer] ° °

## 2015-10-28 NOTE — Progress Notes (Signed)
   Subjective:    Patient ID: [name], female    DOB: 1934/12/09, 81 y.o.   MRN: [id number]  HPI Patient brought in today by her daughter stating that patient is nauseaous and weak feeling- started a couple of days ago. SHe is having urinary frequency and urgency with slight burning. Having more frequent nocturia.    Review of Systems  Constitutional: Positive for fatigue.  HENT: Negative.   Respiratory: Negative.   Cardiovascular: Negative.   Gastrointestinal: Negative.   Genitourinary: Negative.  Negative for urgency and frequency.  Neurological: Negative.   Psychiatric/Behavioral: Negative.   All other systems reviewed and are negative.      Objective:   Physical Exam  Constitutional: She is oriented to person, place, and time. She appears well-developed and well-nourished. No distress.  Cardiovascular: Normal rate, regular rhythm and normal heart sounds.   Pulmonary/Chest: Effort normal and breath sounds normal.  Abdominal: Soft. Bowel sounds are normal. There is tenderness (mild suprapubic pain on palpation).  Neurological: She is alert and oriented to person, place, and time.  Skin: Skin is warm and dry.  Psychiatric: She has a normal mood and affect. Her behavior is normal. Judgment and thought content normal.   BP 129/74 mmHg  Pulse 82  Temp(Src) 97.3 F (36.3 C) (Oral)  Ht 5' 2.5" (1.588 m)  Wt 158 lb 3.2 oz (71.759 kg)  BMI 28.46 kg/m2  Urine clear      Assessment & Plan:   1. Urinary frequency   2. Chronic fatigue   3. Nausea   Urine clear Orders Placed This Encounter  Procedures  . Urinalysis, Complete  . Anemia Profile B  . Vitamin B12  . BMP8+EGFR  . Ambulatory referral to Cardiology    Referral Priority:  Routine    Referral Type:  Consultation    Referral Reason:  Specialty Services Required    Referred to Provider:  Minus Breeding, MD    Requested Specialty:  Cardiology    Number of Visits Requested:  1  patient has zofran at  home and was told to use if needed Force fluids Will talk once get lab results  [place], FNP

## 2015-10-29 ENCOUNTER — Encounter: Payer: Self-pay | Admitting: Nurse Practitioner

## 2015-10-29 ENCOUNTER — Ambulatory Visit (INDEPENDENT_AMBULATORY_CARE_PROVIDER_SITE_OTHER): Payer: Medicare Other | Admitting: Nurse Practitioner

## 2015-10-29 VITALS — BP 124/71 | HR 77 | Temp 97.0°F | Ht 62.5 in | Wt 155.8 lb

## 2015-10-29 DIAGNOSIS — R5383 Other fatigue: Secondary | ICD-10-CM | POA: Diagnosis not present

## 2015-10-29 MED ORDER — CYANOCOBALAMIN 1000 MCG/ML IJ SOLN
1000.0000 ug | INTRAMUSCULAR | Status: DC
  Administered 2015-10-29: 1000 ug via INTRAMUSCULAR

## 2015-10-29 NOTE — Progress Notes (Signed)
   Subjective:    Patient ID: [name], female    DOB: Aug 01, 1934, 81 y.o.   MRN: [id number]  HPI  Paient was seen yesterday with wakness and nausea. Labs were drawn but results are not back yet. SHe says today she thinks she feels more tired and weak. Denies any pain. Just has no energy to do anything.denies chest pain , sob, palpitations. Stools are normal.   Review of Systems  Constitutional: Negative.   HENT: Negative.   Respiratory: Negative.   Cardiovascular: Negative.   Genitourinary: Negative.   Neurological: Negative.   Psychiatric/Behavioral: Negative.   All other systems reviewed and are negative.      Objective:   Physical Exam  Constitutional: She is oriented to person, place, and time. She appears well-developed and well-nourished.  Cardiovascular: Normal rate, regular rhythm and normal heart sounds.   Pulmonary/Chest: Effort normal and breath sounds normal.  Neurological: She is alert and oriented to person, place, and time.  Skin: Skin is warm.  Psychiatric: She has a normal mood and affect. Her behavior is normal. Judgment and thought content normal.   BP 124/71 mmHg  Pulse 77  Temp(Src) 97 F (36.1 C) (Oral)  Ht 5' 2.5" (1.588 m)  Wt 155 lb 12.8 oz (70.67 kg)  BMI 28.02 kg/m2        Assessment & Plan:  1. Other fatigue Meds ordered this encounter  Medications  . cyanocobalamin ((VITAMIN B-12)) injection 1,000 mcg    Sig:    Tried b12 injection because i had nothing else to offer her todayCall Monday nadlet me know how you feel   Mary-Margaret Daphine Deutscher, FNP

## 2015-11-02 ENCOUNTER — Other Ambulatory Visit: Payer: Medicare Other

## 2015-11-02 DIAGNOSIS — R5382 Chronic fatigue, unspecified: Secondary | ICD-10-CM | POA: Diagnosis not present

## 2015-11-03 LAB — BMP8+EGFR
BUN / CREAT RATIO: 14 (ref 12–28)
BUN: 15 mg/dL (ref 8–27)
CALCIUM: 9.4 mg/dL (ref 8.7–10.3)
CHLORIDE: 104 mmol/L (ref 96–106)
CO2: 23 mmol/L (ref 18–29)
CREATININE: 1.09 mg/dL — AB (ref 0.57–1.00)
GFR calc non Af Amer: 49 mL/min/{1.73_m2} — ABNORMAL LOW (ref >59)
GFR, EST AFRICAN AMERICAN: 56 mL/min/{1.73_m2} — AB (ref >59)
Glucose: 84 mg/dL (ref 65–99)
Potassium: 4.3 mmol/L (ref 3.5–5.2)
Sodium: 144 mmol/L (ref 134–144)

## 2015-11-03 LAB — ANEMIA PROFILE B
BASOS: 0 %
Basophils Absolute: 0 10*3/uL (ref 0.0–0.2)
EOS (ABSOLUTE): 0.1 10*3/uL (ref 0.0–0.4)
Eos: 2 %
FERRITIN: 139 ng/mL (ref 15–150)
Folate: 17.3 ng/mL (ref >3.0)
HEMATOCRIT: 39.4 % (ref 34.0–46.6)
HEMOGLOBIN: 13.4 g/dL (ref 11.1–15.9)
Immature Grans (Abs): 0 10*3/uL (ref 0.0–0.1)
Immature Granulocytes: 0 %
Iron Saturation: 17 % (ref 15–55)
Iron: 60 ug/dL (ref 27–139)
LYMPHS ABS: 2.5 10*3/uL (ref 0.7–3.1)
LYMPHS: 38 %
MCH: 30.2 pg (ref 26.6–33.0)
MCHC: 34 g/dL (ref 31.5–35.7)
MCV: 89 fL (ref 79–97)
MONOS ABS: 0.4 10*3/uL (ref 0.1–0.9)
Monocytes: 6 %
NEUTROS ABS: 3.6 10*3/uL (ref 1.4–7.0)
Neutrophils: 54 %
Platelets: 349 10*3/uL (ref 150–379)
RBC: 4.44 x10E6/uL (ref 3.77–5.28)
RDW: 14.7 % (ref 12.3–15.4)
Retic Ct Pct: 1.2 % (ref 0.6–2.6)
Total Iron Binding Capacity: 349 ug/dL (ref 250–450)
UIBC: 289 ug/dL (ref 118–369)
WBC: 6.7 10*3/uL (ref 3.4–10.8)

## 2015-11-07 ENCOUNTER — Ambulatory Visit: Payer: Medicare Other | Admitting: Family Medicine

## 2015-11-08 ENCOUNTER — Encounter: Payer: Self-pay | Admitting: Pediatrics

## 2015-11-08 ENCOUNTER — Encounter: Payer: Self-pay | Admitting: Family

## 2015-11-08 ENCOUNTER — Ambulatory Visit (INDEPENDENT_AMBULATORY_CARE_PROVIDER_SITE_OTHER): Payer: Medicare Other | Admitting: Family

## 2015-11-08 VITALS — BP 143/77 | HR 69 | Temp 98.3°F | Ht 62.5 in | Wt 159.2 lb

## 2015-11-08 DIAGNOSIS — R110 Nausea: Secondary | ICD-10-CM

## 2015-11-08 DIAGNOSIS — J309 Allergic rhinitis, unspecified: Secondary | ICD-10-CM | POA: Diagnosis not present

## 2015-11-08 MED ORDER — CETIRIZINE HCL 10 MG PO TABS: 5.0000 mg | ORAL_TABLET | Freq: Every day | ORAL | Status: DC

## 2015-11-08 MED ORDER — FLUTICASONE PROPIONATE 50 MCG/ACT NA SUSP: 2.0000 | Freq: Every day | NASAL | Status: DC

## 2015-11-08 MED ORDER — ONDANSETRON 4 MG PO TBDP: 4.0000 mg | ORAL_TABLET | Freq: Three times a day (TID) | ORAL | Status: DC | PRN

## 2015-11-08 NOTE — Progress Notes (Signed)
Subjective:     Patient ID: [name], female   DOB: 1934/08/21, 81 y.o.   MRN: [id number]  HPI PT presents to the office today with complaints of fatigue and nausea that started about 4 days ago. PT states she is having waves of nausea that only last for a few minutes and is gone. PT denies any SOB, dysuria,or chest pain. PT states she is having a cough and sinus drainage with a clear sputum. Pt has used OTC cough syrup with mild relief.   Review of Systems  Constitutional: Positive for fatigue.  HENT: Positive for sinus pressure. Negative for congestion, ear discharge, ear pain, sneezing and sore throat.   Eyes: Negative.   Respiratory: Negative.  Negative for shortness of breath.   Cardiovascular: Negative.  Negative for palpitations.  Gastrointestinal: Positive for nausea. Negative for vomiting, diarrhea and constipation.  Endocrine: Negative.   Genitourinary: Negative.   Musculoskeletal: Negative.   Neurological: Negative.  Negative for headaches.  Hematological: Negative.   Psychiatric/Behavioral: Negative.   All other systems reviewed and are negative.      Objective:   Physical Exam  Constitutional: She is oriented to person, place, and time. She appears well-developed and well-nourished. No distress.  HENT:  Head: Normocephalic and atraumatic.  Right Ear: External ear normal.  Left Ear: External ear normal.  Nose: Mucosal edema and rhinorrhea present. Right sinus exhibits maxillary sinus tenderness. Left sinus exhibits maxillary sinus tenderness.  Mouth/Throat: Posterior oropharyngeal erythema present. No oropharyngeal exudate or posterior oropharyngeal edema.  Eyes: Pupils are equal, round, and reactive to light.  Neck: Normal range of motion. Neck supple. No thyromegaly present.  Cardiovascular: Normal rate, regular rhythm, normal heart sounds and intact distal pulses.   No murmur heard. Pulmonary/Chest: Effort normal and breath sounds normal. No respiratory  distress. She has no wheezes.  Abdominal: Soft. Bowel sounds are normal. She exhibits no distension. There is no tenderness.  Musculoskeletal: Normal range of motion. She exhibits no edema or tenderness.  Neurological: She is alert and oriented to person, place, and time. She has normal reflexes. No cranial nerve deficit.  Skin: Skin is warm and dry.  Psychiatric: She has a normal mood and affect. Her behavior is normal. Judgment and thought content normal.  Vitals reviewed.      Assessment:        Plan:     1. Allergic rhinitis, unspecified allergic rhinitis type -PT to restart flonase and zyrtec -Avoid allergens when possible - fluticasone (FLONASE) 50 MCG/ACT nasal spray; Place 2 sprays into both nostrils daily.  Dispense: 16 g; Refill: 2 - cetirizine (ZYRTEC) 10 MG tablet; Take 0.5 tablets (5 mg total) by mouth at bedtime.  Dispense: 90 tablet; Refill: 3  2. Nausea without vomiting -Zofran as needed  -Bland diet -I believe this is related from drainage - ondansetron (ZOFRAN ODT) 4 MG disintegrating tablet; Take 1 tablet (4 mg total) by mouth every 8 (eight) hours as needed for nausea or vomiting.  Dispense: 20 tablet; Refill: 0  Pt had anemia panel drawn on 11/02/15 that was WNL  Jannifer Rodney, FNP

## 2015-11-08 NOTE — Patient Instructions (Signed)
Allergic Rhinitis Allergic rhinitis is when the mucous membranes in the nose respond to allergens. Allergens are particles in the air that cause your body to have an allergic reaction. This causes you to release allergic antibodies. Through a chain of events, these eventually cause you to release histamine into the blood stream. Although meant to protect the body, it is this release of histamine that causes your discomfort, such as frequent sneezing, congestion, and an itchy, runny nose.  CAUSES Seasonal allergic rhinitis (hay fever) is caused by pollen allergens that may come from grasses, trees, and weeds. Year-round allergic rhinitis (perennial allergic rhinitis) is caused by allergens such as house dust mites, pet dander, and mold spores. SYMPTOMS  Nasal stuffiness (congestion).  Itchy, runny nose with sneezing and tearing of the eyes. DIAGNOSIS Your health care provider can help you determine the allergen or allergens that trigger your symptoms. If you and your health care provider are unable to determine the allergen, skin or blood testing may be used. Your health care provider will diagnose your condition after taking your health history and performing a physical exam. Your health care provider may assess you for other related conditions, such as asthma, pink eye, or an ear infection. TREATMENT Allergic rhinitis does not have a cure, but it can be controlled by:  Medicines that block allergy symptoms. These may include allergy shots, nasal sprays, and oral antihistamines.  Avoiding the allergen. Hay fever may often be treated with antihistamines in pill or nasal spray forms. Antihistamines block the effects of histamine. There are over-the-counter medicines that may help with nasal congestion and swelling around the eyes. Check with your health care provider before taking or giving this medicine. If avoiding the allergen or the medicine prescribed do not work, there are many new medicines  your health care provider can prescribe. Stronger medicine may be used if initial measures are ineffective. Desensitizing injections can be used if medicine and avoidance does not work. Desensitization is when a patient is given ongoing shots until the body becomes less sensitive to the allergen. Make sure you follow up with your health care provider if problems continue. HOME CARE INSTRUCTIONS It is not possible to completely avoid allergens, but you can reduce your symptoms by taking steps to limit your exposure to them. It helps to know exactly what you are allergic to so that you can avoid your specific triggers. SEEK MEDICAL CARE IF:  You have a fever.  You develop a cough that does not stop easily (persistent).  You have shortness of breath.  You start wheezing.  Symptoms interfere with normal daily activities.   This information is not intended to replace advice given to you by your health care provider. Make sure you discuss any questions you have with your health care provider.   Document Released: 01/09/2001 Document Revised: 05/07/2014 Document Reviewed: 12/22/2012 Elsevier Interactive Patient Education 2016 [employer] Inc.  

## 2015-11-09 LAB — URINE CULTURE

## 2015-11-09 LAB — MICROSCOPIC EXAMINATION: Bacteria, UA: NONE SEEN

## 2015-11-09 LAB — URINALYSIS, COMPLETE
BILIRUBIN UA: NEGATIVE
GLUCOSE, UA: NEGATIVE
Ketones, UA: NEGATIVE
NITRITE UA: NEGATIVE
PH UA: 6 (ref 5.0–7.5)
PROTEIN UA: NEGATIVE
Specific Gravity, UA: 1.02 (ref 1.005–1.030)
UUROB: 0.2 mg/dL (ref 0.2–1.0)

## 2015-11-11 ENCOUNTER — Telehealth: Payer: Self-pay | Admitting: Pediatrics

## 2015-11-11 ENCOUNTER — Telehealth: Payer: Self-pay | Admitting: Gastroenterology

## 2015-11-11 DIAGNOSIS — R110 Nausea: Secondary | ICD-10-CM

## 2015-11-11 NOTE — Telephone Encounter (Signed)
Spoke to pt's daughter [name] and she is requesting a GI referral for her mother due to the constant nausea and stomach issues. Please advise.

## 2015-11-11 NOTE — Telephone Encounter (Signed)
GI referral ordered

## 2015-11-14 ENCOUNTER — Emergency Department (HOSPITAL_COMMUNITY): Payer: Medicare Other

## 2015-11-14 ENCOUNTER — Observation Stay (HOSPITAL_COMMUNITY)
Admission: EM | Admit: 2015-11-14 | Discharge: 2015-11-16 | Disposition: A | Discharge status: Home / Self Care | Payer: Medicare Other | Attending: Internal Medicine | Admitting: Internal Medicine

## 2015-11-14 ENCOUNTER — Encounter (HOSPITAL_COMMUNITY): Payer: Self-pay

## 2015-11-14 ENCOUNTER — Telehealth: Payer: Self-pay | Admitting: Pediatrics

## 2015-11-14 ENCOUNTER — Observation Stay (HOSPITAL_COMMUNITY): Payer: Medicare Other

## 2015-11-14 DIAGNOSIS — I10 Essential (primary) hypertension: Secondary | ICD-10-CM | POA: Diagnosis present

## 2015-11-14 DIAGNOSIS — E785 Hyperlipidemia, unspecified: Secondary | ICD-10-CM | POA: Diagnosis present

## 2015-11-14 DIAGNOSIS — R112 Nausea with vomiting, unspecified: Secondary | ICD-10-CM | POA: Diagnosis not present

## 2015-11-14 DIAGNOSIS — E782 Mixed hyperlipidemia: Secondary | ICD-10-CM | POA: Insufficient documentation

## 2015-11-14 DIAGNOSIS — M542 Cervicalgia: Secondary | ICD-10-CM | POA: Diagnosis not present

## 2015-11-14 DIAGNOSIS — F419 Anxiety disorder, unspecified: Secondary | ICD-10-CM | POA: Diagnosis not present

## 2015-11-14 DIAGNOSIS — F0280 Dementia in other diseases classified elsewhere without behavioral disturbance: Secondary | ICD-10-CM | POA: Insufficient documentation

## 2015-11-14 DIAGNOSIS — F17210 Nicotine dependence, cigarettes, uncomplicated: Secondary | ICD-10-CM | POA: Insufficient documentation

## 2015-11-14 DIAGNOSIS — Z880 Allergy status to penicillin: Secondary | ICD-10-CM | POA: Insufficient documentation

## 2015-11-14 DIAGNOSIS — Z7982 Long term (current) use of aspirin: Secondary | ICD-10-CM | POA: Diagnosis not present

## 2015-11-14 DIAGNOSIS — W07XXXA Fall from chair, initial encounter: Secondary | ICD-10-CM | POA: Diagnosis not present

## 2015-11-14 DIAGNOSIS — M1990 Unspecified osteoarthritis, unspecified site: Secondary | ICD-10-CM | POA: Insufficient documentation

## 2015-11-14 DIAGNOSIS — F329 Major depressive disorder, single episode, unspecified: Secondary | ICD-10-CM | POA: Insufficient documentation

## 2015-11-14 DIAGNOSIS — E119 Type 2 diabetes mellitus without complications: Secondary | ICD-10-CM | POA: Insufficient documentation

## 2015-11-14 DIAGNOSIS — K219 Gastro-esophageal reflux disease without esophagitis: Secondary | ICD-10-CM | POA: Insufficient documentation

## 2015-11-14 DIAGNOSIS — I429 Cardiomyopathy, unspecified: Secondary | ICD-10-CM | POA: Diagnosis not present

## 2015-11-14 DIAGNOSIS — E860 Dehydration: Secondary | ICD-10-CM | POA: Diagnosis present

## 2015-11-14 DIAGNOSIS — J449 Chronic obstructive pulmonary disease, unspecified: Secondary | ICD-10-CM | POA: Diagnosis not present

## 2015-11-14 DIAGNOSIS — R0602 Shortness of breath: Secondary | ICD-10-CM | POA: Diagnosis not present

## 2015-11-14 DIAGNOSIS — R079 Chest pain, unspecified: Secondary | ICD-10-CM | POA: Diagnosis not present

## 2015-11-14 DIAGNOSIS — S199XXA Unspecified injury of neck, initial encounter: Secondary | ICD-10-CM | POA: Diagnosis not present

## 2015-11-14 DIAGNOSIS — R42 Dizziness and giddiness: Secondary | ICD-10-CM | POA: Diagnosis not present

## 2015-11-14 DIAGNOSIS — G309 Alzheimer's disease, unspecified: Secondary | ICD-10-CM | POA: Insufficient documentation

## 2015-11-14 DIAGNOSIS — S0990XA Unspecified injury of head, initial encounter: Secondary | ICD-10-CM | POA: Diagnosis not present

## 2015-11-14 DIAGNOSIS — R0789 Other chest pain: Secondary | ICD-10-CM | POA: Diagnosis not present

## 2015-11-14 DIAGNOSIS — F0390 Unspecified dementia without behavioral disturbance: Secondary | ICD-10-CM

## 2015-11-14 DIAGNOSIS — Z96641 Presence of right artificial hip joint: Secondary | ICD-10-CM | POA: Diagnosis not present

## 2015-11-14 DIAGNOSIS — R55 Syncope and collapse: Principal | ICD-10-CM | POA: Diagnosis present

## 2015-11-14 DIAGNOSIS — M8580 Other specified disorders of bone density and structure, unspecified site: Secondary | ICD-10-CM

## 2015-11-14 DIAGNOSIS — F03A0 Unspecified dementia, mild, without behavioral disturbance, psychotic disturbance, mood disturbance, and anxiety: Secondary | ICD-10-CM | POA: Diagnosis present

## 2015-11-14 LAB — BASIC METABOLIC PANEL
ANION GAP: 7 (ref 5–15)
BUN: 13 mg/dL (ref 6–20)
CALCIUM: 9.3 mg/dL (ref 8.9–10.3)
CO2: 27 mmol/L (ref 22–32)
Chloride: 105 mmol/L (ref 101–111)
Creatinine, Ser: 1.17 mg/dL — ABNORMAL HIGH (ref 0.44–1.00)
GFR calc Af Amer: 50 mL/min — ABNORMAL LOW (ref >60)
GFR calc non Af Amer: 43 mL/min — ABNORMAL LOW (ref >60)
GLUCOSE: 99 mg/dL (ref 65–99)
Potassium: 3.7 mmol/L (ref 3.5–5.1)
Sodium: 139 mmol/L (ref 135–145)

## 2015-11-14 LAB — DIFFERENTIAL
Basophils Absolute: 0 10*3/uL (ref 0.0–0.1)
Basophils Relative: 0 %
EOS PCT: 1 %
Eosinophils Absolute: 0.1 10*3/uL (ref 0.0–0.7)
LYMPHS ABS: 2.7 10*3/uL (ref 0.7–4.0)
LYMPHS PCT: 29 %
Monocytes Absolute: 0.5 10*3/uL (ref 0.1–1.0)
Monocytes Relative: 5 %
NEUTROS ABS: 6.3 10*3/uL (ref 1.7–7.7)
NEUTROS PCT: 65 %

## 2015-11-14 LAB — URINALYSIS, ROUTINE W REFLEX MICROSCOPIC
Bilirubin Urine: NEGATIVE
GLUCOSE, UA: NEGATIVE mg/dL
Hgb urine dipstick: NEGATIVE
Ketones, ur: NEGATIVE mg/dL
LEUKOCYTES UA: NEGATIVE
NITRITE: NEGATIVE
PH: 6.5 (ref 5.0–8.0)
PROTEIN: NEGATIVE mg/dL
Specific Gravity, Urine: 1.019 (ref 1.005–1.030)

## 2015-11-14 LAB — CBC
HEMATOCRIT: 40.4 % (ref 36.0–46.0)
HEMOGLOBIN: 13.3 g/dL (ref 12.0–15.0)
MCH: 29.6 pg (ref 26.0–34.0)
MCHC: 32.9 g/dL (ref 30.0–36.0)
MCV: 90 fL (ref 78.0–100.0)
Platelets: 364 10*3/uL (ref 150–400)
RBC: 4.49 MIL/uL (ref 3.87–5.11)
RDW: 14.6 % (ref 11.5–15.5)
WBC: 9.4 10*3/uL (ref 4.0–10.5)

## 2015-11-14 LAB — HEPATIC FUNCTION PANEL
ALBUMIN: 3.6 g/dL (ref 3.5–5.0)
ALT: 20 U/L (ref 14–54)
AST: 26 U/L (ref 15–41)
Alkaline Phosphatase: 86 U/L (ref 38–126)
BILIRUBIN TOTAL: 0.3 mg/dL (ref 0.3–1.2)
Total Protein: 6.3 g/dL — ABNORMAL LOW (ref 6.5–8.1)

## 2015-11-14 LAB — LIPASE, BLOOD: Lipase: 17 U/L (ref 11–51)

## 2015-11-14 LAB — CBG MONITORING, ED: GLUCOSE-CAPILLARY: 150 mg/dL — AB (ref 65–99)

## 2015-11-14 LAB — I-STAT TROPONIN, ED: Troponin i, poc: 0 ng/mL (ref 0.00–0.08)

## 2015-11-14 MED ORDER — ONDANSETRON HCL 4 MG/2ML IJ SOLN
4.0000 mg | Freq: Once | INTRAMUSCULAR | Status: AC
  Administered 2015-11-14: 4 mg via INTRAVENOUS
  Filled 2015-11-14: qty 2

## 2015-11-14 MED ORDER — SODIUM CHLORIDE 0.9 % IV BOLUS (SEPSIS)
1000.0000 mL | Freq: Once | INTRAVENOUS | Status: AC
  Administered 2015-11-14: 1000 mL via INTRAVENOUS

## 2015-11-14 NOTE — Telephone Encounter (Signed)
Left message on machine to call back  

## 2015-11-14 NOTE — H&P (Addendum)
History and Physical    [name] ZOX:[id number] DOB: 1934/06/14 DOA: 11/14/2015  Referring MD/NP/PA: Dr. Verdie Mosher PCP: [name], MD  Patient coming from: Home  Chief Complaint: Syncope  HPI: [name] is a 81 y.o. female with medical history significant of HTN, HLD, nonischemic cardiomyopathy last EF 45-50% in 10/2014, depression, anxiety; who presents with multiple complaints including generalized weakness, nausea, vomiting, and chest pain over the last day. Patient's daughter provides most of her history patient has dementia. Patient was just last hospitalized last month being found to have a urinary tract infection. Patient was noted to seem briefly improved after discharge, but subsequently had recurrence of generalized weakness with intermittent nausea over the last few weeks. This morning patient had multiple episodes of vomitus with complaints of sharp intermittent left-sided chest pain for which they brought her in for further evaluation. In the emergency room patient was noted to have slumped over in her chest falling forward hitting her head on the ground. There is no witnessed seizure-like activity or loss of bowel or bladder noted. After patient came to she was noted to be somewhat disoriented and confused. Patient was noted to have fallen approximately one month ago,but family notes that the patient was coming home from her friend's house at night.  ED Course: Upon admission into the emergency department patient was evaluated and seen to have relatively normal vital signs. Lab work was relatively unremarkable. Urinalysis was negative. CT scan head and cervical spine show no acute abnormalities.    Review of Systems: As per HPI otherwise 10 point review of systems negative.   Past Medical History  Diagnosis Date  . Essential hypertension, benign   . Mixed hyperlipidemia   . Depression   . Allergic rhinitis   . GERD (gastroesophageal reflux disease)   . Arthritis     . History of blood transfusion   . Left bundle branch block   . History of cardiac catheterization     Normal coronaries 2010  . Secondary cardiomyopathy (HCC)     LVEF 40-45%, likely nonischemic  . Anxiety   . Osteopenia   . Cataract     had eye surgery  . Memory loss     Past Surgical History  Procedure Laterality Date  . Cholecystectomy  1991  . Tonsillectomy    . Appendectomy    . Back surgery  07/21/2008  . Rotator cuff repair  2011    right  . Cataract extraction w/phaco  04/16/2011    Procedure: CATARACT EXTRACTION PHACO AND INTRAOCULAR LENS PLACEMENT (IOC);  Surgeon: Susa Simmonds;  Location: [facility];  Service: Ophthalmology;  Laterality: Left;  CDE=11.35  . Hammer toe surgery  11/22/11    [facility], [name]  . Cataract extraction w/phaco  01/28/2012    Procedure: CATARACT EXTRACTION PHACO AND INTRAOCULAR LENS PLACEMENT (IOC);  Surgeon: Susa Simmonds, MD;  Location: [facility];  Service: Ophthalmology;  Laterality: Right;  CDI:8.15  . Cysto with hydrodistension  05/29/2012    Procedure: CYSTOSCOPY/HYDRODISTENSION;  Surgeon: Martina Sinner, MD;  Location: [facility];  Service: Urology;  Laterality: N/A;  INSTILLATION OF MARCAINE AND PYRIDIUM   . Lumbar laminectomy/decompression microdiscectomy Left 06/06/2012    Procedure: LUMBAR LAMINECTOMY/DECOMPRESSION MICRODISCECTOMY 1 LEVEL;  Surgeon: Maeola Harman, MD;  Location: [facility];  Service: Neurosurgery;  Laterality: Left;  Left Lumbar two-three Laminectomy for resection of synovial cyst  . Total hip arthroplasty Right 03/13/2013    Procedure: TOTAL HIP ARTHROPLASTY;  Surgeon: Nestor Lewandowsky, MD;  Location: [facility] OR;  Service: Orthopedics;  Laterality: Right;  . Eye surgery Bilateral     catar  . Abdominal hysterectomy      complete  . Spine surgery      back suregry 2011  . Joint replacement      right hip replacement 2013     reports that she has been smoking Cigarettes.  She has a 4.5 pack-year smoking  history. She has never used smokeless tobacco. She reports that she does not drink alcohol or use illicit drugs.  Allergies  Allergen Reactions  . Lisinopril-Hydrochlorothiazide Other (See Comments)    Lip swelling and cough. Stopped by ENT. Patient is on Losartan without any issues   . Codeine Itching and Swelling    eye irritation (prescribed percocet)  . Penicillins Itching and Swelling    Has patient had a PCN reaction causing immediate rash, facial/tongue/throat swelling, SOB or lightheadedness with hypotension: Yes Has patient had a PCN reaction causing severe rash involving mucus membranes or skin necrosis: No Has patient had a PCN reaction that required hospitalization No Has patient had a PCN reaction occurring within the last 10 years: No If all of the above answers are "NO", then may proceed with Cephalosporin use. eye irritation    Family History  Problem Relation Age of Onset  . Colon cancer Neg Hx   . Anesthesia problems Neg Hx   . Hypotension Neg Hx   . Malignant hyperthermia Neg Hx   . Pseudochol deficiency Neg Hx   . Diabetes Other   . Allergies Other   . Diabetes Father   . Diabetes Brother   . Cancer Brother 42    colon  . Diabetes Brother   . Diabetes Brother   . Hypertension Sister   . Diabetes Sister   . Diabetes Sister     Prior to Admission medications   Medication Sig Start Date End Date Taking? Authorizing Provider  albuterol (PROVENTIL HFA;VENTOLIN HFA) 108 (90 Base) MCG/ACT inhaler Inhale 2 puffs into the lungs every 6 (six) hours as needed for wheezing or shortness of breath. 10/18/15  Yes Junie Spencer, FNP  ALPRAZolam (XANAX) 0.5 MG tablet Take 1 tablet (0.5 mg total) by mouth 2 (two) times daily as needed for anxiety. 10/18/15  Yes Junie Spencer, FNP  aspirin EC 81 MG tablet Take 81 mg by mouth daily.   Yes Historical Provider, MD  Biotin 5000 MCG CAPS Take 1 capsule by mouth daily.   Yes Historical Provider, MD  buPROPion (WELLBUTRIN SR)  150 MG 12 hr tablet Take 1 tablet (150 mg total) by mouth 2 (two) times daily. Patient taking differently: Take 150 mg by mouth daily.  10/13/15  Yes Junie Spencer, FNP  carvedilol (COREG) 6.25 MG tablet Take 1 tablet (6.25 mg total) by mouth 2 (two) times daily with a meal. 10/13/15  Yes Junie Spencer, FNP  cetirizine (ZYRTEC) 10 MG tablet Take 0.5 tablets (5 mg total) by mouth at bedtime. 11/08/15  Yes Junie Spencer, FNP  cyclobenzaprine (FLEXERIL) 5 MG tablet Take 5 mg by mouth daily as needed for muscle spasms.  10/22/15  Yes Historical Provider, MD  donepezil (ARICEPT) 10 MG tablet Take 1 tablet (10 mg total) by mouth every evening. With food 10/26/15  Yes Tammy Eckard, PHARMD  esomeprazole (NEXIUM) 40 MG capsule Take 1 capsule (40 mg total) by mouth daily before breakfast. 10/13/15  Yes Junie Spencer, FNP  fluticasone (FLONASE) 50 MCG/ACT nasal spray Place 2 sprays into both nostrils daily. 11/08/15  Yes Christy A Hawks, FNP  fluticasone furoate-vilanterol (BREO ELLIPTA) 200-25 MCG/INH AEPB Inhale 1 puff into the lungs daily. 10/26/15  Yes Tammy Eckard, PHARMD  furosemide (LASIX) 20 MG tablet Take 0.5 tablets (10 mg total) by mouth daily. 10/13/15  Yes Junie Spencer, FNP  ibuprofen (ADVIL,MOTRIN) 800 MG tablet Take 1 tablet (800 mg total) by mouth every 8 (eight) hours as needed. Patient taking differently: Take 800 mg by mouth every 8 (eight) hours as needed for moderate pain.  10/14/15  Yes Elige Radon Dettinger, MD  losartan (COZAAR) 50 MG tablet Take 0.5 tablets (25 mg total) by mouth daily. 10/13/15  Yes Junie Spencer, FNP  memantine (NAMENDA) 10 MG tablet Take 1 tablet (10 mg total) by mouth 2 (two) times daily. 10/13/15  Yes Junie Spencer, FNP  Multiple Vitamins-Minerals (CENTRUM SILVER PO) Take 1 tablet by mouth daily.   Yes Historical Provider, MD  ondansetron (ZOFRAN ODT) 4 MG disintegrating tablet Take 1 tablet (4 mg total) by mouth every 8 (eight) hours as needed for nausea or  vomiting. 11/08/15  Yes Junie Spencer, FNP  raloxifene (EVISTA) 60 MG tablet Take 1 tablet (60 mg total) by mouth daily. 10/13/15  Yes Junie Spencer, FNP  SYMBICORT 160-4.5 MCG/ACT inhaler Inhale 1 puff into the lungs 2 (two) times daily. 10/13/15  Yes Historical Provider, MD  vitamin B-12 (CYANOCOBALAMIN) 1000 MCG tablet Take 1,000 mcg by mouth daily.   Yes Historical Provider, MD    Physical Exam:  Constitutional:  Elderly female who appear lethargic but arousable able to follow commands. Filed Vitals:   11/14/15 2200 11/14/15 2215 11/14/15 2220 11/14/15 2230  BP: 134/62 134/70 154/76 144/56  Pulse: 69 64 70 69  Temp:      TempSrc:      Resp:   17   SpO2: 99% 99% 96% 97%   Eyes: PERRL, lids and conjunctivae normal ENMT: Mucous membranes are moist. Posterior pharynx clear of any exudate or lesions.Normal dentition.  Neck: normal, supple, no masses, no thyromegaly Respiratory: clear to auscultation bilaterally, no wheezing, no crackles. Normal respiratory effort. No accessory muscle use.  Cardiovascular: Regular rate and rhythm, no murmurs / rubs / gallops. No extremity edema. 2+ pedal pulses. No carotid bruits.  Abdomen: no tenderness, no masses palpated. No hepatosplenomegaly. Bowel sounds positive.  Musculoskeletal: no clubbing / cyanosis. No joint deformity upper and lower extremities. Good ROM, no contractures. Normal muscle tone.  Skin: no rashes, lesions, ulcers. No induration Neurologic: CN 2-12 grossly intact. Sensation intact, DTR normal. Strength 5/5 in all 4.  Psychiatric: Normal judgment and insight. Alert and oriented x 3. Normal mood.     Labs on Admission: I have personally reviewed following labs and imaging studies  CBC:  Recent Labs Lab 11/14/15 1730 11/14/15 1820  WBC 9.4  --   NEUTROABS  --  6.3  HGB 13.3  --   HCT 40.4  --   MCV 90.0  --   PLT 364  --    Basic Metabolic Panel:  Recent Labs Lab 11/14/15 1730  NA 139  K 3.7  CL 105  CO2 27    GLUCOSE 99  BUN 13  CREATININE 1.17*  CALCIUM 9.3   GFR: Estimated Creatinine Clearance: 37.3 mL/min (by C-G formula based on Cr of 1.17). Liver Function Tests:  Recent Labs Lab 11/14/15 1820  AST 26  ALT 20  ALKPHOS 86  BILITOT 0.3  PROT 6.3*  ALBUMIN 3.6    Recent Labs Lab 11/14/15 1820  LIPASE 17   No results for input(s): AMMONIA in the last 168 hours. Coagulation Profile: No results for input(s): INR, PROTIME in the last 168 hours. Cardiac Enzymes: No results for input(s): CKTOTAL, CKMB, CKMBINDEX, TROPONINI in the last 168 hours. BNP (last 3 results) No results for input(s): PROBNP in the last 8760 hours. HbA1C: No results for input(s): HGBA1C in the last 72 hours. CBG:  Recent Labs Lab 11/14/15 1923  GLUCAP 150*   Lipid Profile: No results for input(s): CHOL, HDL, LDLCALC, TRIG, CHOLHDL, LDLDIRECT in the last 72 hours. Thyroid Function Tests: No results for input(s): TSH, T4TOTAL, FREET4, T3FREE, THYROIDAB in the last 72 hours. Anemia Panel: No results for input(s): VITAMINB12, FOLATE, FERRITIN, TIBC, IRON, RETICCTPCT in the last 72 hours. Urine analysis:    Component Value Date/Time   COLORURINE YELLOW 11/14/2015 1940   APPEARANCEUR CLEAR 11/14/2015 1940   APPEARANCEUR Clear 10/28/2015 1622   LABSPEC 1.019 11/14/2015 1940   PHURINE 6.5 11/14/2015 1940   GLUCOSEU NEGATIVE 11/14/2015 1940   HGBUR NEGATIVE 11/14/2015 1940   BILIRUBINUR NEGATIVE 11/14/2015 1940   BILIRUBINUR Negative 10/28/2015 1622   BILIRUBINUR neg 08/18/2013 1021   KETONESUR NEGATIVE 11/14/2015 1940   PROTEINUR NEGATIVE 11/14/2015 1940   PROTEINUR Negative 10/28/2015 1622   PROTEINUR trace 08/18/2013 1021   UROBILINOGEN 0.2 10/04/2013 1457   UROBILINOGEN negative 08/18/2013 1021   NITRITE NEGATIVE 11/14/2015 1940   NITRITE Negative 10/28/2015 1622   NITRITE neg 08/18/2013 1021   LEUKOCYTESUR NEGATIVE 11/14/2015 1940   LEUKOCYTESUR Trace* 10/28/2015 1622   Sepsis  Labs: No results found for this or any previous visit (from the past 240 hour(s)).   Radiological Exams on Admission: Dg Chest 2 View  11/14/2015  CLINICAL DATA:  Left-sided chest pain, shortness of breath, nausea, and vomiting. EXAM: CHEST  2 VIEW COMPARISON:  Chest radiographs and CTA 02/21/2015 FINDINGS: The cardiac silhouette remains mildly enlarged. Thoracic aortic atherosclerosis is again noted. The patient has taken a slightly shallower inspiration than on the prior radiographs, and there are mild bibasilar opacities bilaterally, left worse than right. There is no evidence of pulmonary edema, pleural effusion, or pneumothorax. Degenerative changes are noted at the shoulders. Right upper quadrant abdominal surgical clips are present. IMPRESSION: 1. Bibasilar opacities favored to reflect atelectasis.  No edema. 2. Aortic atherosclerosis. Electronically Signed   By: Sebastian Ache M.D.   On: 11/14/2015 18:15   Ct Head Wo Contrast  11/14/2015  CLINICAL DATA:  Status post fall. Complaining of neck pain. Confusion. EXAM: CT HEAD WITHOUT CONTRAST CT CERVICAL SPINE WITHOUT CONTRAST TECHNIQUE: Multidetector CT imaging of the head and cervical spine was performed following the standard protocol without intravenous contrast. Multiplanar CT image reconstructions of the cervical spine were also generated. COMPARISON:  Cervical spine radiograph 10/17/2015, brain MRI 08/11/2015 FINDINGS: CT HEAD FINDINGS Brain parenchyma: Normal for age. No intraparenchymal hemorrhage or evidence of acute cortical infarct. No mass lesion or midline shift. Ventricles, sulci and extra-axial spaces: Normal for age. No extra-axial collection. Paranasal sinuses and mastoids: Normal. Visualized orbits: Normal. Skull and extracranial soft tissues: Small amount of right frontal scalp soft tissue swelling. No fracture identified. CT CERVICAL SPINE FINDINGS There is no acute fracture or static subluxation of the cervical spine. Facet  articulations are normally aligned. There is advanced degenerative change at the left C1-2 articulation. There is multilevel moderate to severe cyst facet  hypertrophy, left worse than right. No advanced spinal canal stenosis. No severe neural foraminal stenosis is seen. There is a 4 mm right upper lobe pulmonary nodule. Otherwise, paraspinal soft tissues are normal. Bilateral temporomandibular joint degenerative changes. Atherosclerotic calcification noted within the bilateral carotid arteries. IMPRESSION: Head CT: 1. No acute intracranial abnormality.  Normal head CT for age. 2. Small right frontal scalp soft tissue swelling. Cervical spine CT: 1. No acute fracture or static subluxation of the cervical spine. 2. Multilevel left-greater-than-right facet arthrosis. 3. 4 mm right upper lobe pulmonary nodule. No follow-up needed if patient is low-risk. Non-contrast chest CT can be considered in 12 months if patient is high-risk. This recommendation follows the consensus statement: Guidelines for Management of Incidental Pulmonary Nodules Detected on CT Images: From the Fleischner Society 2017; published online before print (10.1148/[id number]). Electronically Signed   By: Deatra Robinson M.D.   On: 11/14/2015 20:32   Ct Cervical Spine Wo Contrast  11/14/2015  CLINICAL DATA:  Status post fall. Complaining of neck pain. Confusion. EXAM: CT HEAD WITHOUT CONTRAST CT CERVICAL SPINE WITHOUT CONTRAST TECHNIQUE: Multidetector CT imaging of the head and cervical spine was performed following the standard protocol without intravenous contrast. Multiplanar CT image reconstructions of the cervical spine were also generated. COMPARISON:  Cervical spine radiograph 10/17/2015, brain MRI 08/11/2015 FINDINGS: CT HEAD FINDINGS Brain parenchyma: Normal for age. No intraparenchymal hemorrhage or evidence of acute cortical infarct. No mass lesion or midline shift. Ventricles, sulci and extra-axial spaces: Normal for age. No  extra-axial collection. Paranasal sinuses and mastoids: Normal. Visualized orbits: Normal. Skull and extracranial soft tissues: Small amount of right frontal scalp soft tissue swelling. No fracture identified. CT CERVICAL SPINE FINDINGS There is no acute fracture or static subluxation of the cervical spine. Facet articulations are normally aligned. There is advanced degenerative change at the left C1-2 articulation. There is multilevel moderate to severe cyst facet hypertrophy, left worse than right. No advanced spinal canal stenosis. No severe neural foraminal stenosis is seen. There is a 4 mm right upper lobe pulmonary nodule. Otherwise, paraspinal soft tissues are normal. Bilateral temporomandibular joint degenerative changes. Atherosclerotic calcification noted within the bilateral carotid arteries. IMPRESSION: Head CT: 1. No acute intracranial abnormality.  Normal head CT for age. 2. Small right frontal scalp soft tissue swelling. Cervical spine CT: 1. No acute fracture or static subluxation of the cervical spine. 2. Multilevel left-greater-than-right facet arthrosis. 3. 4 mm right upper lobe pulmonary nodule. No follow-up needed if patient is low-risk. Non-contrast chest CT can be considered in 12 months if patient is high-risk. This recommendation follows the consensus statement: Guidelines for Management of Incidental Pulmonary Nodules Detected on CT Images: From the Fleischner Society 2017; published online before print (10.1148/[id number]). Electronically Signed   By: Deatra Robinson M.D.   On: 11/14/2015 20:32    EKG: Independently reviewed.  Normal sinus rhythm with LBBB   Assessment/Plan Syncope secondary to question of dehydration: Acute. Question aspect of dehydration with recent nausea and vomiting prior to arrival in ED. Initial imaging studies CT scan and MRI negative.  - Admit to a telemetry bed - IV fluids of Normal Saline - Trend cardiac troponins   Nausea & vomiting/ Vertigo:  Intermittent. Question peripheral vertigo following recent illness 1-1/2 ago.  - trial of Meclizine prn N/V/ dizziness  Essential hypertension - Continue Coreg and losartan - Held Lasix  COPD without acute exacerbation - continue home inhalers  Alzheimer's dementia: Daughter notes patient still drives and has gotten lost at  least twice unable to find her way home. - Continue Aricept and Namenda   Osteopenia - continue raloxifene  Anxiety  - continue Xanax prn  GERD - Pharmacy substitution of Protonix  DVT prophylaxis: lovenox   Code Status: full Family Communication: discussed plan with the patient's daughter present at bedside. Disposition Plan: possible discharge home in 1-2 days.  Consults called: none  Admission status: telemetry observation  Clydie Braun MD [facility] Pager [phone]  If 7PM-7AM, please contact night-coverage [email or web address] Password [email or web address]  11/14/2015, 10:57 PM

## 2015-11-14 NOTE — ED Notes (Signed)
Pt to CT at this time.

## 2015-11-14 NOTE — ED Notes (Signed)
CGB 150 at this time.

## 2015-11-14 NOTE — Telephone Encounter (Signed)
Patient aware we are still working on referral and will contact her as soon as it gets finished. Patient states understanding.

## 2015-11-14 NOTE — ED Provider Notes (Signed)
CSN: [id number]     Arrival date & time 11/14/15  1717 History   First MD Initiated Contact with Patient 11/14/15 1823     Chief Complaint  Patient presents with  . Chest Pain     (Consider location/radiation/quality/duration/timing/severity/associated sxs/prior Treatment) HPI 81 year old female who presents with chest pain, generalized weakness, nausea and vomiting for one day. She has a history of hypertension, hyperlipidemia, nonischemic cardiomyopathy with EF of 40-45%. History is primarily provided by patient's daughter who states that she was admitted in June for UTI. Symptoms briefly improved for a week after discharge, but subsequently had recurrence of generalized weakness with intermittent nausea over the past month. This morning started having multiple episodes of nonbilious nonbloody emesis with sharp intermittent left-sided chest pain. No fevers or chills, no difficulty breathing, or diarrhea. Some intermittent abdominal discomfort reported with recurrent dysuria and urinary frequency. Was brought to ED for further evaluation, and in triage while waiting to get a chest x-ray was witnessed to have slumped over in her chair and fell forward onto the ground. No witnessed seizure-like activity. After slumping over, she had disorientation and confusion when she awoke.   Past Medical History  Diagnosis Date  . Essential hypertension, benign   . Mixed hyperlipidemia   . Depression   . Allergic rhinitis   . GERD (gastroesophageal reflux disease)   . Arthritis   . History of blood transfusion   . Left bundle branch block   . History of cardiac catheterization     Normal coronaries 2010  . Secondary cardiomyopathy (HCC)     LVEF 40-45%, likely nonischemic  . Anxiety   . Osteopenia   . Cataract     had eye surgery  . Memory loss    Past Surgical History  Procedure Laterality Date  . Cholecystectomy  1991  . Tonsillectomy    . Appendectomy    . Back surgery  07/21/2008  .  Rotator cuff repair  2011    right  . Cataract extraction w/phaco  04/16/2011    Procedure: CATARACT EXTRACTION PHACO AND INTRAOCULAR LENS PLACEMENT (IOC);  Surgeon: Susa Simmonds;  Location: [facility];  Service: Ophthalmology;  Laterality: Left;  CDE=11.35  . Hammer toe surgery  11/22/11    [facility], [name]  . Cataract extraction w/phaco  01/28/2012    Procedure: CATARACT EXTRACTION PHACO AND INTRAOCULAR LENS PLACEMENT (IOC);  Surgeon: Susa Simmonds, MD;  Location: [facility];  Service: Ophthalmology;  Laterality: Right;  CDI:8.15  . Cysto with hydrodistension  05/29/2012    Procedure: CYSTOSCOPY/HYDRODISTENSION;  Surgeon: Martina Sinner, MD;  Location: [facility];  Service: Urology;  Laterality: N/A;  INSTILLATION OF MARCAINE AND PYRIDIUM   . Lumbar laminectomy/decompression microdiscectomy Left 06/06/2012    Procedure: LUMBAR LAMINECTOMY/DECOMPRESSION MICRODISCECTOMY 1 LEVEL;  Surgeon: Maeola Harman, MD;  Location: [facility];  Service: Neurosurgery;  Laterality: Left;  Left Lumbar two-three Laminectomy for resection of synovial cyst  . Total hip arthroplasty Right 03/13/2013    Procedure: TOTAL HIP ARTHROPLASTY;  Surgeon: Nestor Lewandowsky, MD;  Location: [facility];  Service: Orthopedics;  Laterality: Right;  . Eye surgery Bilateral     catar  . Abdominal hysterectomy      complete  . Spine surgery      back suregry 2011  . Joint replacement      right hip replacement 2013   Family History  Problem Relation Age of Onset  . Colon cancer Neg Hx   . Anesthesia  problems Neg Hx   . Hypotension Neg Hx   . Malignant hyperthermia Neg Hx   . Pseudochol deficiency Neg Hx   . Diabetes Other   . Allergies Other   . Diabetes Father   . Diabetes Brother   . Cancer Brother 88    colon  . Diabetes Brother   . Diabetes Brother   . Hypertension Sister   . Diabetes Sister   . Diabetes Sister    Social History  Substance Use Topics  . Smoking status: Current Every Day Smoker -- 0.10  packs/day for 45 years    Types: Cigarettes  . Smokeless tobacco: Never Used     Comment: smokes 2 cigarettes a day  . Alcohol Use: No   OB History    No data available     Review of Systems 10/14 systems reviewed and are negative other than those stated in the HPI    Allergies  Lisinopril-hydrochlorothiazide; Codeine; and Penicillins  Home Medications   Prior to Admission medications   Medication Sig Start Date End Date Taking? Authorizing Provider  albuterol (PROVENTIL HFA;VENTOLIN HFA) 108 (90 Base) MCG/ACT inhaler Inhale 2 puffs into the lungs every 6 (six) hours as needed for wheezing or shortness of breath. 10/18/15  Yes Junie Spencer, FNP  ALPRAZolam (XANAX) 0.5 MG tablet Take 1 tablet (0.5 mg total) by mouth 2 (two) times daily as needed for anxiety. 10/18/15  Yes Junie Spencer, FNP  aspirin EC 81 MG tablet Take 81 mg by mouth daily.   Yes Historical Provider, MD  Biotin 5000 MCG CAPS Take 1 capsule by mouth daily.   Yes Historical Provider, MD  buPROPion (WELLBUTRIN SR) 150 MG 12 hr tablet Take 1 tablet (150 mg total) by mouth 2 (two) times daily. Patient taking differently: Take 150 mg by mouth daily.  10/13/15  Yes Junie Spencer, FNP  carvedilol (COREG) 6.25 MG tablet Take 1 tablet (6.25 mg total) by mouth 2 (two) times daily with a meal. 10/13/15  Yes Junie Spencer, FNP  cetirizine (ZYRTEC) 10 MG tablet Take 0.5 tablets (5 mg total) by mouth at bedtime. 11/08/15  Yes Junie Spencer, FNP  cyclobenzaprine (FLEXERIL) 5 MG tablet Take 5 mg by mouth daily as needed for muscle spasms.  10/22/15  Yes Historical Provider, MD  donepezil (ARICEPT) 10 MG tablet Take 1 tablet (10 mg total) by mouth every evening. With food 10/26/15  Yes Tammy Eckard, PHARMD  esomeprazole (NEXIUM) 40 MG capsule Take 1 capsule (40 mg total) by mouth daily before breakfast. 10/13/15  Yes Junie Spencer, FNP  fluticasone (FLONASE) 50 MCG/ACT nasal spray Place 2 sprays into both nostrils daily. 11/08/15   Yes Christy A Hawks, FNP  fluticasone furoate-vilanterol (BREO ELLIPTA) 200-25 MCG/INH AEPB Inhale 1 puff into the lungs daily. 10/26/15  Yes Tammy Eckard, PHARMD  furosemide (LASIX) 20 MG tablet Take 0.5 tablets (10 mg total) by mouth daily. 10/13/15  Yes Junie Spencer, FNP  ibuprofen (ADVIL,MOTRIN) 800 MG tablet Take 1 tablet (800 mg total) by mouth every 8 (eight) hours as needed. Patient taking differently: Take 800 mg by mouth every 8 (eight) hours as needed for moderate pain.  10/14/15  Yes Elige Radon Dettinger, MD  losartan (COZAAR) 50 MG tablet Take 0.5 tablets (25 mg total) by mouth daily. 10/13/15  Yes Junie Spencer, FNP  memantine (NAMENDA) 10 MG tablet Take 1 tablet (10 mg total) by mouth 2 (two) times daily. 10/13/15  Yes Christy  A Hawks, FNP  Multiple Vitamins-Minerals (CENTRUM SILVER PO) Take 1 tablet by mouth daily.   Yes Historical Provider, MD  ondansetron (ZOFRAN ODT) 4 MG disintegrating tablet Take 1 tablet (4 mg total) by mouth every 8 (eight) hours as needed for nausea or vomiting. 11/08/15  Yes Junie Spencer, FNP  raloxifene (EVISTA) 60 MG tablet Take 1 tablet (60 mg total) by mouth daily. 10/13/15  Yes Junie Spencer, FNP  SYMBICORT 160-4.5 MCG/ACT inhaler Inhale 1 puff into the lungs 2 (two) times daily. 10/13/15  Yes Historical Provider, MD  vitamin B-12 (CYANOCOBALAMIN) 1000 MCG tablet Take 1,000 mcg by mouth daily.   Yes Historical Provider, MD   BP 144/56 mmHg  Pulse 69  Temp(Src) 97.9 F (36.6 C) (Oral)  Resp 17  SpO2 97% Physical Exam Physical Exam  Nursing note and vitals reviewed. Constitutional: non-toxic, and in no acute distress Head: Normocephalic and atraumatic.  Mouth/Throat: Oropharynx is clear and dry.  Neck: Normal range of motion. Neck supple.  Cardiovascular: Normal rate and regular rhythm.  No edema. Pulmonary/Chest: Effort normal and breath sounds normal.  Abdominal: Soft. There is upper abdominal tenderness. There is no rebound and no guarding.   Musculoskeletal: No deformities or tenderness. Neurological: Alert, disoriented to birthday, president, time and place, no facial droop, fluent speech, moves all extremities symmetrically, sensation to light touch in tact throughout, PERRL, ? Dysmetria with FNF on LUE Skin: Skin is warm and dry.  Psychiatric: Cooperative  ED Course  Procedures (including critical care time) Labs Review Labs Reviewed  BASIC METABOLIC PANEL - Abnormal; Notable for the following:    Creatinine, Ser 1.17 (*)    GFR calc non Af Amer 43 (*)    GFR calc Af Amer 50 (*)    All other components within normal limits  HEPATIC FUNCTION PANEL - Abnormal; Notable for the following:    Total Protein 6.3 (*)    Bilirubin, Direct <0.1 (*)    All other components within normal limits  CBG MONITORING, ED - Abnormal; Notable for the following:    Glucose-Capillary 150 (*)    All other components within normal limits  URINE CULTURE  CBC  URINALYSIS, ROUTINE W REFLEX MICROSCOPIC (NOT AT [facility])  LIPASE, BLOOD  DIFFERENTIAL  I-STAT TROPOININ, ED    Imaging Review Dg Chest 2 View  11/14/2015  CLINICAL DATA:  Left-sided chest pain, shortness of breath, nausea, and vomiting. EXAM: CHEST  2 VIEW COMPARISON:  Chest radiographs and CTA 02/21/2015 FINDINGS: The cardiac silhouette remains mildly enlarged. Thoracic aortic atherosclerosis is again noted. The patient has taken a slightly shallower inspiration than on the prior radiographs, and there are mild bibasilar opacities bilaterally, left worse than right. There is no evidence of pulmonary edema, pleural effusion, or pneumothorax. Degenerative changes are noted at the shoulders. Right upper quadrant abdominal surgical clips are present. IMPRESSION: 1. Bibasilar opacities favored to reflect atelectasis.  No edema. 2. Aortic atherosclerosis. Electronically Signed   By: Sebastian Ache M.D.   On: 11/14/2015 18:15   Ct Head Wo Contrast  11/14/2015  CLINICAL DATA:  Status post fall.  Complaining of neck pain. Confusion. EXAM: CT HEAD WITHOUT CONTRAST CT CERVICAL SPINE WITHOUT CONTRAST TECHNIQUE: Multidetector CT imaging of the head and cervical spine was performed following the standard protocol without intravenous contrast. Multiplanar CT image reconstructions of the cervical spine were also generated. COMPARISON:  Cervical spine radiograph 10/17/2015, brain MRI 08/11/2015 FINDINGS: CT HEAD FINDINGS Brain parenchyma: Normal for age. No intraparenchymal  hemorrhage or evidence of acute cortical infarct. No mass lesion or midline shift. Ventricles, sulci and extra-axial spaces: Normal for age. No extra-axial collection. Paranasal sinuses and mastoids: Normal. Visualized orbits: Normal. Skull and extracranial soft tissues: Small amount of right frontal scalp soft tissue swelling. No fracture identified. CT CERVICAL SPINE FINDINGS There is no acute fracture or static subluxation of the cervical spine. Facet articulations are normally aligned. There is advanced degenerative change at the left C1-2 articulation. There is multilevel moderate to severe cyst facet hypertrophy, left worse than right. No advanced spinal canal stenosis. No severe neural foraminal stenosis is seen. There is a 4 mm right upper lobe pulmonary nodule. Otherwise, paraspinal soft tissues are normal. Bilateral temporomandibular joint degenerative changes. Atherosclerotic calcification noted within the bilateral carotid arteries. IMPRESSION: Head CT: 1. No acute intracranial abnormality.  Normal head CT for age. 2. Small right frontal scalp soft tissue swelling. Cervical spine CT: 1. No acute fracture or static subluxation of the cervical spine. 2. Multilevel left-greater-than-right facet arthrosis. 3. 4 mm right upper lobe pulmonary nodule. No follow-up needed if patient is low-risk. Non-contrast chest CT can be considered in 12 months if patient is high-risk. This recommendation follows the consensus statement: Guidelines for  Management of Incidental Pulmonary Nodules Detected on CT Images: From the Fleischner Society 2017; published online before print (10.1148/[id number]). Electronically Signed   By: Deatra Robinson M.D.   On: 11/14/2015 20:32   Ct Cervical Spine Wo Contrast  11/14/2015  CLINICAL DATA:  Status post fall. Complaining of neck pain. Confusion. EXAM: CT HEAD WITHOUT CONTRAST CT CERVICAL SPINE WITHOUT CONTRAST TECHNIQUE: Multidetector CT imaging of the head and cervical spine was performed following the standard protocol without intravenous contrast. Multiplanar CT image reconstructions of the cervical spine were also generated. COMPARISON:  Cervical spine radiograph 10/17/2015, brain MRI 08/11/2015 FINDINGS: CT HEAD FINDINGS Brain parenchyma: Normal for age. No intraparenchymal hemorrhage or evidence of acute cortical infarct. No mass lesion or midline shift. Ventricles, sulci and extra-axial spaces: Normal for age. No extra-axial collection. Paranasal sinuses and mastoids: Normal. Visualized orbits: Normal. Skull and extracranial soft tissues: Small amount of right frontal scalp soft tissue swelling. No fracture identified. CT CERVICAL SPINE FINDINGS There is no acute fracture or static subluxation of the cervical spine. Facet articulations are normally aligned. There is advanced degenerative change at the left C1-2 articulation. There is multilevel moderate to severe cyst facet hypertrophy, left worse than right. No advanced spinal canal stenosis. No severe neural foraminal stenosis is seen. There is a 4 mm right upper lobe pulmonary nodule. Otherwise, paraspinal soft tissues are normal. Bilateral temporomandibular joint degenerative changes. Atherosclerotic calcification noted within the bilateral carotid arteries. IMPRESSION: Head CT: 1. No acute intracranial abnormality.  Normal head CT for age. 2. Small right frontal scalp soft tissue swelling. Cervical spine CT: 1. No acute fracture or static subluxation of  the cervical spine. 2. Multilevel left-greater-than-right facet arthrosis. 3. 4 mm right upper lobe pulmonary nodule. No follow-up needed if patient is low-risk. Non-contrast chest CT can be considered in 12 months if patient is high-risk. This recommendation follows the consensus statement: Guidelines for Management of Incidental Pulmonary Nodules Detected on CT Images: From the Fleischner Society 2017; published online before print (10.1148/[id number]). Electronically Signed   By: Deatra Robinson M.D.   On: 11/14/2015 20:32   I have personally reviewed and evaluated these images and lab results as part of my medical decision-making.   EKG Interpretation   Date/Time:  Monday November 14 2015 17:20:08 EDT Ventricular Rate:  73 PR Interval:  164 QRS Duration: 132 QT Interval:  462 QTC Calculation: 508 R Axis:   46 Text Interpretation:  Normal sinus rhythm Right atrial enlargement Left  bundle branch block Abnormal ECG Confirmed by Donnald Garre, MD, Lebron Conners [phone])  on 11/14/2015 5:25:21 PM      MDM   Final diagnoses:  Dizziness  Non-intractable vomiting with nausea, vomiting of unspecified type  Chest pain, unspecified chest pain type   81 year old female with history of HTN, HLD and NICMP who presents with sudden onset of N/V, dizziness and intermittent chest discomfort since this morning. I evaluated patient in x-ray suite when she first had syncope and fall. At that time she seems very weak and fatigued, and disoriented. Vitals signs were WNL.  EKG with no acute ischemic changes and troponin is normal. Chest x-ray shows no acute cardiopulmonary processes. Her blood work was without any major electrolyte or metabolic derangements. Her urinalysis was unremarkable. She was given IV fluids and antiemetics, with persistent dizziness and nausea. On re-evaluation, unable to ambulate by self and ? Of possible dysmetria FNF on the LUE. Will obtain MRI to r/o posterior CVA. Discussed with Dr. Katrinka Blazing and  admitted to hospitalist service pending MRI and for ongoing rehydration, antiemetics, and cardiac evaluation.   Medications  sodium chloride 0.9 % bolus 1,000 mL (0 mLs Intravenous Stopped 11/14/15 2040)  ondansetron (ZOFRAN) injection 4 mg (4 mg Intravenous Given 11/14/15 1910)       Lavera Guise, MD 11/14/15 2317

## 2015-11-14 NOTE — ED Notes (Signed)
Pt here for left sided chest pain, SOB, nausea and vomiting that started about 2 hours ago (1500).

## 2015-11-14 NOTE — ED Notes (Signed)
Received call from Radiology that pt had fallen in X-ray.  Dr Verdie Mosher and this RN went to radiology and found pt sitting on ground, being held up by 2 radiology staff.  Pt was altered, but per daughter, this was since this am.  No hematomas or abnormalities noted.  PT does c/o L neck pain.  Placed in C-collar and moved to stretcher using proper precautions.

## 2015-11-15 ENCOUNTER — Encounter (HOSPITAL_COMMUNITY): Payer: Self-pay | Admitting: General Practice

## 2015-11-15 ENCOUNTER — Telehealth: Payer: Self-pay

## 2015-11-15 ENCOUNTER — Telehealth: Payer: Self-pay | Admitting: Gastroenterology

## 2015-11-15 ENCOUNTER — Observation Stay (HOSPITAL_BASED_OUTPATIENT_CLINIC_OR_DEPARTMENT_OTHER): Payer: Medicare Other

## 2015-11-15 DIAGNOSIS — R55 Syncope and collapse: Secondary | ICD-10-CM

## 2015-11-15 DIAGNOSIS — R112 Nausea with vomiting, unspecified: Secondary | ICD-10-CM | POA: Diagnosis present

## 2015-11-15 DIAGNOSIS — I10 Essential (primary) hypertension: Secondary | ICD-10-CM | POA: Diagnosis not present

## 2015-11-15 DIAGNOSIS — J449 Chronic obstructive pulmonary disease, unspecified: Secondary | ICD-10-CM | POA: Diagnosis not present

## 2015-11-15 LAB — CBC
HCT: 37.9 % (ref 36.0–46.0)
Hemoglobin: 12.6 g/dL (ref 12.0–15.0)
MCH: 30 pg (ref 26.0–34.0)
MCHC: 33.2 g/dL (ref 30.0–36.0)
MCV: 90.2 fL (ref 78.0–100.0)
PLATELETS: 335 10*3/uL (ref 150–400)
RBC: 4.2 MIL/uL (ref 3.87–5.11)
RDW: 14.7 % (ref 11.5–15.5)
WBC: 9 10*3/uL (ref 4.0–10.5)

## 2015-11-15 LAB — BASIC METABOLIC PANEL
ANION GAP: 6 (ref 5–15)
BUN: 9 mg/dL (ref 6–20)
CO2: 26 mmol/L (ref 22–32)
Calcium: 8.6 mg/dL — ABNORMAL LOW (ref 8.9–10.3)
Chloride: 109 mmol/L (ref 101–111)
Creatinine, Ser: 1.03 mg/dL — ABNORMAL HIGH (ref 0.44–1.00)
GFR calc Af Amer: 59 mL/min — ABNORMAL LOW (ref >60)
GFR, EST NON AFRICAN AMERICAN: 51 mL/min — AB (ref >60)
Glucose, Bld: 85 mg/dL (ref 65–99)
POTASSIUM: 4.2 mmol/L (ref 3.5–5.1)
SODIUM: 141 mmol/L (ref 135–145)

## 2015-11-15 LAB — ECHOCARDIOGRAM COMPLETE
CHL CUP MV DEC (S): 310
CHL CUP STROKE VOLUME: 42 mL
CHL CUP TV REG PEAK VELOCITY: 249 cm/s
E decel time: 310 msec
E/e' ratio: 13.97
FS: 21 % — AB (ref 28–44)
IV/PV OW: 1.17
LA ID, A-P, ES: 35 mm
LA diam end sys: 35 mm
LA diam index: 2 cm/m2
LA vol A4C: 51.4 ml
LA vol index: 37.7 mL/m2
LAVOL: 66 mL
LDCA: 3.14 cm2
LV SIMPSON'S DISK: 47
LV TDI E'MEDIAL: 3.92
LV dias vol index: 52 mL/m2
LV e' LATERAL: 4.13 cm/s
LV sys vol index: 28 mL/m2
LV sys vol: 48 mL — AB (ref 14–42)
LVDIAVOL: 90 mL (ref 46–106)
LVEEAVG: 13.97
LVEEMED: 13.97
LVOT diameter: 20 mm
Lateral S' vel: 12.5 cm/s
MV pk A vel: 101 m/s
MVPKEVEL: 57.7 m/s
P 1/2 time: 554 ms
PW: 12.7 mm — AB (ref 0.6–1.1)
RV sys press: 28 mmHg
S' Lateral: 6 cm/s
TAPSE: 19.8 mm
TDI e' lateral: 4.13
TRMAXVEL: 249 cm/s

## 2015-11-15 LAB — TROPONIN I
TROPONIN I: 0.04 ng/mL — AB (ref <0.03)
TROPONIN I: 0.04 ng/mL — AB (ref <0.03)
Troponin I: 0.03 ng/mL (ref <0.03)

## 2015-11-15 LAB — TSH: TSH: 0.904 u[IU]/mL (ref 0.350–4.500)

## 2015-11-15 MED ORDER — LORATADINE 10 MG PO TABS
10.0000 mg | ORAL_TABLET | Freq: Every day | ORAL | Status: DC
Start: 2015-11-15 — End: 2015-11-16
  Administered 2015-11-15 – 2015-11-16 (×2): 10 mg via ORAL
  Filled 2015-11-15 (×2): qty 1

## 2015-11-15 MED ORDER — DONEPEZIL HCL 5 MG PO TABS: 10.0000 mg | ORAL_TABLET | Freq: Every evening | ORAL | Status: DC

## 2015-11-15 MED ORDER — MEMANTINE HCL 10 MG PO TABS
10.0000 mg | ORAL_TABLET | Freq: Two times a day (BID) | ORAL | Status: DC
  Administered 2015-11-15 – 2015-11-16 (×3): 10 mg via ORAL
  Filled 2015-11-15 (×3): qty 1

## 2015-11-15 MED ORDER — MECLIZINE HCL 25 MG PO TABS
25.0000 mg | ORAL_TABLET | ORAL | Status: AC
  Administered 2015-11-15: 25 mg via ORAL
  Filled 2015-11-15: qty 1

## 2015-11-15 MED ORDER — ACETAMINOPHEN 325 MG PO TABS: 650.0000 mg | ORAL_TABLET | Freq: Four times a day (QID) | ORAL | Status: DC | PRN

## 2015-11-15 MED ORDER — PANTOPRAZOLE SODIUM 40 MG PO TBEC
40.0000 mg | DELAYED_RELEASE_TABLET | Freq: Every day | ORAL | Status: DC
  Administered 2015-11-15 – 2015-11-16 (×2): 40 mg via ORAL
  Filled 2015-11-15 (×2): qty 1

## 2015-11-15 MED ORDER — CARVEDILOL 6.25 MG PO TABS
6.2500 mg | ORAL_TABLET | Freq: Two times a day (BID) | ORAL | Status: DC
  Administered 2015-11-15 – 2015-11-16 (×3): 6.25 mg via ORAL
  Filled 2015-11-15 (×3): qty 1

## 2015-11-15 MED ORDER — ADULT MULTIVITAMIN W/MINERALS CH
1.0000 | ORAL_TABLET | Freq: Every day | ORAL | Status: DC
  Administered 2015-11-15 – 2015-11-16 (×2): 1 via ORAL
  Filled 2015-11-15 (×2): qty 1

## 2015-11-15 MED ORDER — ENOXAPARIN SODIUM 40 MG/0.4ML ~~LOC~~ SOLN
40.0000 mg | SUBCUTANEOUS | Status: DC
  Administered 2015-11-15: 40 mg via SUBCUTANEOUS
  Filled 2015-11-15: qty 0.4

## 2015-11-15 MED ORDER — ACETAMINOPHEN 650 MG RE SUPP: 650.0000 mg | Freq: Four times a day (QID) | RECTAL | Status: DC | PRN

## 2015-11-15 MED ORDER — ASPIRIN EC 81 MG PO TBEC
81.0000 mg | DELAYED_RELEASE_TABLET | Freq: Every day | ORAL | Status: DC
  Administered 2015-11-15 – 2015-11-16 (×2): 81 mg via ORAL
  Filled 2015-11-15 (×2): qty 1

## 2015-11-15 MED ORDER — SODIUM CHLORIDE 0.9% FLUSH: 3.0000 mL | Freq: Two times a day (BID) | INTRAVENOUS | Status: DC

## 2015-11-15 MED ORDER — BIOTIN 5000 MCG PO CAPS: 1.0000 | ORAL_CAPSULE | Freq: Every day | ORAL | Status: DC

## 2015-11-15 MED ORDER — LOSARTAN POTASSIUM 25 MG PO TABS
25.0000 mg | ORAL_TABLET | Freq: Every day | ORAL | Status: DC
  Administered 2015-11-15 – 2015-11-16 (×2): 25 mg via ORAL
  Filled 2015-11-15 (×2): qty 1

## 2015-11-15 MED ORDER — ALPRAZOLAM 0.5 MG PO TABS
0.5000 mg | ORAL_TABLET | Freq: Two times a day (BID) | ORAL | Status: DC | PRN
  Administered 2015-11-15: 0.5 mg via ORAL
  Filled 2015-11-15: qty 1

## 2015-11-15 MED ORDER — FLUTICASONE FUROATE-VILANTEROL 200-25 MCG/INH IN AEPB
1.0000 | INHALATION_SPRAY | Freq: Every day | RESPIRATORY_TRACT | Status: DC
  Administered 2015-11-15 – 2015-11-16 (×2): 1 via RESPIRATORY_TRACT
  Filled 2015-11-15: qty 28

## 2015-11-15 MED ORDER — VITAMIN B-12 1000 MCG PO TABS
1000.0000 ug | ORAL_TABLET | Freq: Every day | ORAL | Status: DC
  Administered 2015-11-15 – 2015-11-16 (×2): 1000 ug via ORAL
  Filled 2015-11-15 (×2): qty 1

## 2015-11-15 MED ORDER — SODIUM CHLORIDE 0.9 % IV SOLN
INTRAVENOUS | Status: DC
  Administered 2015-11-15: 07:00:00 via INTRAVENOUS

## 2015-11-15 MED ORDER — ALBUTEROL SULFATE (2.5 MG/3ML) 0.083% IN NEBU
2.5000 mg | INHALATION_SOLUTION | RESPIRATORY_TRACT | Status: DC | PRN
  Administered 2015-11-16: 2.5 mg via RESPIRATORY_TRACT
  Filled 2015-11-15: qty 3

## 2015-11-15 MED ORDER — BUPROPION HCL ER (SR) 150 MG PO TB12
150.0000 mg | ORAL_TABLET | Freq: Every day | ORAL | Status: DC
  Administered 2015-11-15 – 2015-11-16 (×2): 150 mg via ORAL
  Filled 2015-11-15 (×2): qty 1

## 2015-11-15 MED ORDER — POLYETHYLENE GLYCOL 3350 17 G PO PACK
17.0000 g | PACK | Freq: Every day | ORAL | Status: DC
  Administered 2015-11-15 – 2015-11-16 (×2): 17 g via ORAL
  Filled 2015-11-15 (×2): qty 1

## 2015-11-15 MED ORDER — FLUTICASONE PROPIONATE 50 MCG/ACT NA SUSP
2.0000 | Freq: Every day | NASAL | Status: DC
Start: 2015-11-15 — End: 2015-11-16
  Administered 2015-11-15 – 2015-11-16 (×3): 2 via NASAL
  Filled 2015-11-15 (×2): qty 16

## 2015-11-15 MED ORDER — MECLIZINE HCL 25 MG PO TABS: 25.0000 mg | ORAL_TABLET | Freq: Two times a day (BID) | ORAL | Status: DC | PRN

## 2015-11-15 MED ORDER — RALOXIFENE HCL 60 MG PO TABS
60.0000 mg | ORAL_TABLET | Freq: Every day | ORAL | Status: DC
  Administered 2015-11-15 – 2015-11-16 (×2): 60 mg via ORAL
  Filled 2015-11-15 (×3): qty 1

## 2015-11-15 NOTE — Progress Notes (Signed)
Echocardiogram 2D Echocardiogram has been performed.  [name] 11/15/2015, 3:32 PM

## 2015-11-15 NOTE — Telephone Encounter (Signed)
See alternate phone note  

## 2015-11-15 NOTE — ED Notes (Signed)
Pt lying on stretcher, alert, oriented. Monitor intact to pt. Pt denies any c/o pain.

## 2015-11-15 NOTE — Progress Notes (Addendum)
PROGRESS NOTE    [name]  EYC:[id number] DOB: 02/26/1935 DOA: 11/14/2015 PCP: [name], MD  Brief Narrative: [name] is a 81 y.o. female with medical history significant of HTN, HLD, nonischemic cardiomyopathy last EF 45-50% in 10/2014, depression, anxiety; who presents with multiple complaints including generalized weakness, nausea, vomiting, and chest pain over the last day. Patient's daughter provides most of her history patient has dementia. Patient was just last hospitalized last month being found to have a urinary tract infection. Patient was noted to seem briefly improved after discharge, but subsequently had recurrence of generalized weakness with intermittent nausea over the last few weeks. She had a brief syncopal episode in ED triage  Assessment & Plan:   Syncope Etiology unclear, ? dehydration -Orthostatics negative -no events on tele overnight -check ECHO -Pt eval -continue IVF for 24hours -MRI negative for acute findings  Subacute to chronic nausea -ongoing since May, occasional episodes of vomiting -she started Aricept and namenda in May too and suspect they may be contributing, reviewed both meds, aricept in upto 19% pts can have Gi side effects/nausea, will stop Arcicept and monitor -s/p cholecystectomy, Hb normal, no evidence of GIB, LFTs/Bili-WNL -last Ct abd pelvis in 6/17 unremarkable  Transient chest pain -prior to admission, resolved -Troponin 0.04, no evidence of ACS, chest pain free now -Normal coronaries on cath in 2010 -check ECHO  Essential hypertension - Continue Coreg and losartan - Held Lasix  COPD  -stable, nebs PRN  Alzheimer's dementia:  -see above, held aricept and continue namenda   Osteopenia - continue raloxifene  Anxiety  - continue Xanax prn  GERD - Pharmacy substitution of Protonix  DVT prophylaxis: lovenox  Code Status: full Family Communication: none at bedside, called and d/w daughter Disposition  Plan: Home in 1-2days   Subjective: Nausea off and on for >62month,   Objective: Filed Vitals:   11/15/15 0515 11/15/15 0652 11/15/15 0812 11/15/15 0934  BP:  136/63 140/65   Pulse:  74 65   Temp:  98.2 F (36.8 C) 98.4 F (36.9 C)   TempSrc:  Oral Oral   Resp: 15  18   SpO2:  98% 100% 100%   No intake or output data in the 24 hours ending 11/15/15 1321 There were no vitals filed for this visit.  Examination:  General exam: Alert, awake, oriented to self, place and partly to time Respiratory system: Clear to auscultation. Respiratory effort normal. Cardiovascular system: S1 & S2 heard, RRR. No JVD, murmurs, rubs, gallops or clicks. No pedal edema. Gastrointestinal system: Abdomen is nondistended, soft and nontender. No organomegaly or masses felt. Normal bowel sounds heard. Central nervous system: Alert and oriented. No focal neurological deficits. Extremities: Symmetric 5 x 5 power. Skin: No rashes, lesions or ulcers Psychiatry: Judgement and insight appear normal. Mood & affect appropriate.     Data Reviewed: I have personally reviewed following labs and imaging studies  CBC:  Recent Labs Lab 11/14/15 1730 11/14/15 1820 11/15/15 0050  WBC 9.4  --  9.0  NEUTROABS  --  6.3  --   HGB 13.3  --  12.6  HCT 40.4  --  37.9  MCV 90.0  --  90.2  PLT 364  --  335   Basic Metabolic Panel:  Recent Labs Lab 11/14/15 1730 11/15/15 0050  NA 139 141  K 3.7 4.2  CL 105 109  CO2 27 26  GLUCOSE 99 85  BUN 13 9  CREATININE 1.17* 1.03*  CALCIUM  9.3 8.6*   GFR: Estimated Creatinine Clearance: 42.4 mL/min (by C-G formula based on Cr of 1.03). Liver Function Tests:  Recent Labs Lab 11/14/15 1820  AST 26  ALT 20  ALKPHOS 86  BILITOT 0.3  PROT 6.3*  ALBUMIN 3.6    Recent Labs Lab 11/14/15 1820  LIPASE 17   No results for input(s): AMMONIA in the last 168 hours. Coagulation Profile: No results for input(s): INR, PROTIME in the last 168 hours. Cardiac  Enzymes:  Recent Labs Lab 11/15/15 0050 11/15/15 0652  TROPONINI 0.04* 0.04*   BNP (last 3 results) No results for input(s): PROBNP in the last 8760 hours. HbA1C: No results for input(s): HGBA1C in the last 72 hours. CBG:  Recent Labs Lab 11/14/15 1923  GLUCAP 150*   Lipid Profile: No results for input(s): CHOL, HDL, LDLCALC, TRIG, CHOLHDL, LDLDIRECT in the last 72 hours. Thyroid Function Tests:  Recent Labs  11/15/15 0050  TSH 0.904   Anemia Panel: No results for input(s): VITAMINB12, FOLATE, FERRITIN, TIBC, IRON, RETICCTPCT in the last 72 hours. Urine analysis:    Component Value Date/Time   COLORURINE YELLOW 11/14/2015 1940   APPEARANCEUR CLEAR 11/14/2015 1940   APPEARANCEUR Clear 10/28/2015 1622   LABSPEC 1.019 11/14/2015 1940   PHURINE 6.5 11/14/2015 1940   GLUCOSEU NEGATIVE 11/14/2015 1940   HGBUR NEGATIVE 11/14/2015 1940   BILIRUBINUR NEGATIVE 11/14/2015 1940   BILIRUBINUR Negative 10/28/2015 1622   BILIRUBINUR neg 08/18/2013 1021   KETONESUR NEGATIVE 11/14/2015 1940   PROTEINUR NEGATIVE 11/14/2015 1940   PROTEINUR Negative 10/28/2015 1622   PROTEINUR trace 08/18/2013 1021   UROBILINOGEN 0.2 10/04/2013 1457   UROBILINOGEN negative 08/18/2013 1021   NITRITE NEGATIVE 11/14/2015 1940   NITRITE Negative 10/28/2015 1622   NITRITE neg 08/18/2013 1021   LEUKOCYTESUR NEGATIVE 11/14/2015 1940   LEUKOCYTESUR Trace* 10/28/2015 1622   Sepsis Labs: @LABRCNTIP (procalcitonin:4,lacticidven:4)  )No results found for this or any previous visit (from the past 240 hour(s)).       Radiology Studies: Dg Chest 2 View  11/14/2015  CLINICAL DATA:  Left-sided chest pain, shortness of breath, nausea, and vomiting. EXAM: CHEST  2 VIEW COMPARISON:  Chest radiographs and CTA 02/21/2015 FINDINGS: The cardiac silhouette remains mildly enlarged. Thoracic aortic atherosclerosis is again noted. The patient has taken a slightly shallower inspiration than on the prior radiographs,  and there are mild bibasilar opacities bilaterally, left worse than right. There is no evidence of pulmonary edema, pleural effusion, or pneumothorax. Degenerative changes are noted at the shoulders. Right upper quadrant abdominal surgical clips are present. IMPRESSION: 1. Bibasilar opacities favored to reflect atelectasis.  No edema. 2. Aortic atherosclerosis. Electronically Signed   By: 02/23/2015 M.D.   On: 11/14/2015 18:15   Ct Head Wo Contrast  11/14/2015  CLINICAL DATA:  Status post fall. Complaining of neck pain. Confusion. EXAM: CT HEAD WITHOUT CONTRAST CT CERVICAL SPINE WITHOUT CONTRAST TECHNIQUE: Multidetector CT imaging of the head and cervical spine was performed following the standard protocol without intravenous contrast. Multiplanar CT image reconstructions of the cervical spine were also generated. COMPARISON:  Cervical spine radiograph 10/17/2015, brain MRI 08/11/2015 FINDINGS: CT HEAD FINDINGS Brain parenchyma: Normal for age. No intraparenchymal hemorrhage or evidence of acute cortical infarct. No mass lesion or midline shift. Ventricles, sulci and extra-axial spaces: Normal for age. No extra-axial collection. Paranasal sinuses and mastoids: Normal. Visualized orbits: Normal. Skull and extracranial soft tissues: Small amount of right frontal scalp soft tissue swelling. No fracture identified. CT CERVICAL SPINE  FINDINGS There is no acute fracture or static subluxation of the cervical spine. Facet articulations are normally aligned. There is advanced degenerative change at the left C1-2 articulation. There is multilevel moderate to severe cyst facet hypertrophy, left worse than right. No advanced spinal canal stenosis. No severe neural foraminal stenosis is seen. There is a 4 mm right upper lobe pulmonary nodule. Otherwise, paraspinal soft tissues are normal. Bilateral temporomandibular joint degenerative changes. Atherosclerotic calcification noted within the bilateral carotid arteries.  IMPRESSION: Head CT: 1. No acute intracranial abnormality.  Normal head CT for age. 2. Small right frontal scalp soft tissue swelling. Cervical spine CT: 1. No acute fracture or static subluxation of the cervical spine. 2. Multilevel left-greater-than-right facet arthrosis. 3. 4 mm right upper lobe pulmonary nodule. No follow-up needed if patient is low-risk. Non-contrast chest CT can be considered in 12 months if patient is high-risk. This recommendation follows the consensus statement: Guidelines for Management of Incidental Pulmonary Nodules Detected on CT Images: From the Fleischner Society 2017; published online before print (10.1148/[id number]). Electronically Signed   By: Deatra Robinson M.D.   On: 11/14/2015 20:32   Ct Cervical Spine Wo Contrast  11/14/2015  CLINICAL DATA:  Status post fall. Complaining of neck pain. Confusion. EXAM: CT HEAD WITHOUT CONTRAST CT CERVICAL SPINE WITHOUT CONTRAST TECHNIQUE: Multidetector CT imaging of the head and cervical spine was performed following the standard protocol without intravenous contrast. Multiplanar CT image reconstructions of the cervical spine were also generated. COMPARISON:  Cervical spine radiograph 10/17/2015, brain MRI 08/11/2015 FINDINGS: CT HEAD FINDINGS Brain parenchyma: Normal for age. No intraparenchymal hemorrhage or evidence of acute cortical infarct. No mass lesion or midline shift. Ventricles, sulci and extra-axial spaces: Normal for age. No extra-axial collection. Paranasal sinuses and mastoids: Normal. Visualized orbits: Normal. Skull and extracranial soft tissues: Small amount of right frontal scalp soft tissue swelling. No fracture identified. CT CERVICAL SPINE FINDINGS There is no acute fracture or static subluxation of the cervical spine. Facet articulations are normally aligned. There is advanced degenerative change at the left C1-2 articulation. There is multilevel moderate to severe cyst facet hypertrophy, left worse than right.  No advanced spinal canal stenosis. No severe neural foraminal stenosis is seen. There is a 4 mm right upper lobe pulmonary nodule. Otherwise, paraspinal soft tissues are normal. Bilateral temporomandibular joint degenerative changes. Atherosclerotic calcification noted within the bilateral carotid arteries. IMPRESSION: Head CT: 1. No acute intracranial abnormality.  Normal head CT for age. 2. Small right frontal scalp soft tissue swelling. Cervical spine CT: 1. No acute fracture or static subluxation of the cervical spine. 2. Multilevel left-greater-than-right facet arthrosis. 3. 4 mm right upper lobe pulmonary nodule. No follow-up needed if patient is low-risk. Non-contrast chest CT can be considered in 12 months if patient is high-risk. This recommendation follows the consensus statement: Guidelines for Management of Incidental Pulmonary Nodules Detected on CT Images: From the Fleischner Society 2017; published online before print (10.1148/[id number]). Electronically Signed   By: Deatra Robinson M.D.   On: 11/14/2015 20:32   Mr Brain Wo Contrast  11/15/2015  CLINICAL DATA:  Initial evaluation for acute dizziness. EXAM: MRI HEAD WITHOUT CONTRAST TECHNIQUE: Multiplanar, multiecho pulse sequences of the brain and surrounding structures were obtained without intravenous contrast. COMPARISON:  Prior CT from earlier the same day. Comparison also made with previous MRI from 08/11/2015. FINDINGS: Disproportionate biparietal cerebral volume loss present, stable from prior. Minimal for age patchy T2/FLAIR hyperintensity within the periventricular and deep white matter both cerebral hemispheres.  No abnormal foci of restricted diffusion to suggest acute intracranial infarct. Gray-white matter differentiation maintained. Major intracranial vascular flow voids are preserved. No acute or chronic intracranial hemorrhage. No areas of chronic infarction. No mass lesion, midline shift, or mass effect. No hydrocephalus. No  extra-axial fluid collection. Major dural sinuses are grossly patent. Craniocervical junction within normal limits. Grade 1 anterolisthesis of C3 on C4 without significant stenosis. Remainder the visualized upper cervical spine unremarkable. Pituitary gland within normal limits. No acute abnormality about the globes and orbits. Sequela prior lens extraction noted bilaterally. Paranasal sinuses are clear. No mastoid effusion. Inner ear structures grossly unremarkable. Bone marrow signal intensity within normal limits. No scalp soft tissue abnormality. IMPRESSION: 1. No acute intracranial infarct or other process identified. 2. Disproportionate biparietal cerebral volume loss, which can be seen in underlying Alzheimer's type dementia. 3. Otherwise unremarkable for age noncontrast brain MRI. Electronically Signed   By: Rise Mu M.D.   On: 11/15/2015 00:33        Scheduled Meds: . aspirin EC  81 mg Oral Daily  . buPROPion  150 mg Oral Daily  . carvedilol  6.25 mg Oral BID WC  . enoxaparin (LOVENOX) injection  40 mg Subcutaneous Q24H  . fluticasone  2 spray Each Nare Daily  . fluticasone furoate-vilanterol  1 puff Inhalation Daily  . loratadine  10 mg Oral Daily  . losartan  25 mg Oral Daily  . memantine  10 mg Oral BID  . multivitamin with minerals  1 tablet Oral Daily  . pantoprazole  40 mg Oral Daily  . polyethylene glycol  17 g Oral Daily  . raloxifene  60 mg Oral Daily  . sodium chloride flush  3 mL Intravenous Q12H  . vitamin B-12  1,000 mcg Oral Daily   Continuous Infusions: . sodium chloride 75 mL/hr at 11/15/15 2947        Time spent:    Zannie Cove, MD [facility] Pager [phone]  If 7PM-7AM, please contact night-coverage [email or web address] Password [email or web address] 11/15/2015, 1:21 PM

## 2015-11-15 NOTE — ED Notes (Signed)
Dr Katrinka Blazing notified of trop 0.04

## 2015-11-15 NOTE — ED Notes (Addendum)
Pt being transported via stretcher to 2W11 w/telemetry by Elias Else, NT. All belongings w/pt.

## 2015-11-15 NOTE — Telephone Encounter (Signed)
Insurance approved prior authorization for Diclofenac gel through 04/29/16

## 2015-11-15 NOTE — Telephone Encounter (Signed)
We received referral for pt to be scheduled for OV for nausea without vomiting. Pt is a Dr.Jacobs pt last seen 06/19/11. Dr. Christella Hartigan has no availability for follow up. Pt state she wants to be seen soon. Please advise as to scheduling.

## 2015-11-16 DIAGNOSIS — I10 Essential (primary) hypertension: Secondary | ICD-10-CM

## 2015-11-16 DIAGNOSIS — R112 Nausea with vomiting, unspecified: Secondary | ICD-10-CM

## 2015-11-16 DIAGNOSIS — R55 Syncope and collapse: Secondary | ICD-10-CM

## 2015-11-16 DIAGNOSIS — E860 Dehydration: Secondary | ICD-10-CM

## 2015-11-16 LAB — URINE CULTURE: CULTURE: NO GROWTH

## 2015-11-16 LAB — CBC
HEMATOCRIT: 36.9 % (ref 36.0–46.0)
HEMOGLOBIN: 12.1 g/dL (ref 12.0–15.0)
MCH: 29.5 pg (ref 26.0–34.0)
MCHC: 32.8 g/dL (ref 30.0–36.0)
MCV: 90 fL (ref 78.0–100.0)
Platelets: 316 10*3/uL (ref 150–400)
RBC: 4.1 MIL/uL (ref 3.87–5.11)
RDW: 14.8 % (ref 11.5–15.5)
WBC: 7.7 10*3/uL (ref 4.0–10.5)

## 2015-11-16 LAB — COMPREHENSIVE METABOLIC PANEL
ALBUMIN: 3 g/dL — AB (ref 3.5–5.0)
ALT: 16 U/L (ref 14–54)
AST: 22 U/L (ref 15–41)
Alkaline Phosphatase: 83 U/L (ref 38–126)
Anion gap: 9 (ref 5–15)
BILIRUBIN TOTAL: 0.3 mg/dL (ref 0.3–1.2)
BUN: 7 mg/dL (ref 6–20)
CO2: 24 mmol/L (ref 22–32)
Calcium: 8.3 mg/dL — ABNORMAL LOW (ref 8.9–10.3)
Chloride: 109 mmol/L (ref 101–111)
Creatinine, Ser: 0.95 mg/dL (ref 0.44–1.00)
GFR calc Af Amer: >60 mL/min (ref >60)
GFR calc non Af Amer: 56 mL/min — ABNORMAL LOW (ref >60)
GLUCOSE: 83 mg/dL (ref 65–99)
POTASSIUM: 3.8 mmol/L (ref 3.5–5.1)
SODIUM: 142 mmol/L (ref 135–145)
TOTAL PROTEIN: 5.6 g/dL — AB (ref 6.5–8.1)

## 2015-11-16 NOTE — Care Management Obs Status (Signed)
MEDICARE OBSERVATION STATUS NOTIFICATION   Patient Details  Name: MALVA DIESING MRN: [id number] Date of Birth: Jul 21, 1934   Medicare Observation Status Notification Given:  Yes    Devany, Aja, RN 11/16/2015, 10:52 AM

## 2015-11-16 NOTE — Discharge Summary (Signed)
Physician Discharge Summary  [name] KVQ:[id number] DOB: 02-Jun-1934 DOA: 11/14/2015  PCP: [name], MD  Admit date: 11/14/2015 Discharge date: 11/16/2015  Time spent: 35 minutes  Recommendations for Outpatient Follow-up:  1. [name] presented with N/V and wonder about possibility of this being related to Aricept. On discharge both Aricept and Namenda were discontinued. Please follow up on symptoms on hospital follow up visit.  2. Stopped lasix due to probable dehydration, please follow up on volume status.    Discharge Diagnoses:  Principal Problem:   Syncope Active Problems:   Dizziness   Hyperlipidemia   Essential hypertension   Osteopenia   COPD (chronic obstructive pulmonary disease) (HCC)   Dementia   Dehydration   Nausea and vomiting   Discharge Condition: Stable  Diet recommendation: Heart healthy  Filed Weights   11/15/15 0812  Weight: 72 kg (158 lb 11.7 oz)    History of present illness:  [name] is a 81 y.o. female with medical history significant of HTN, HLD, nonischemic cardiomyopathy last EF 45-50% in 10/2014, depression, anxiety; who presents with multiple complaints including generalized weakness, nausea, vomiting, and chest pain over the last day. Patient's daughter provides most of her history patient has dementia. Patient was just last hospitalized last month being found to have a urinary tract infection. Patient was noted to seem briefly improved after discharge, but subsequently had recurrence of generalized weakness with intermittent nausea over the last few weeks. This morning patient had multiple episodes of vomitus with complaints of sharp intermittent left-sided chest pain for which they brought her in for further evaluation. In the emergency room patient was noted to have slumped over in her chest falling forward hitting her head on the ground. There is no witnessed seizure-like activity or loss of bowel or bladder noted. After  patient came to she was noted to be somewhat disoriented and confused. Patient was noted to have fallen approximately one month ago,but family notes that the patient was coming home from her friend's house at night.  Hospital Course:  [name] is a pleasant 81 year old female with a past medical history of cognitive impairment, states recently been started on Namenda and Aricept, admitted to the medicine service on 11/14/2015 when she presented with nausea/vomiting, having a syncopal event. She had also been on diuretic therapy with Lasix. Suspect that syncope likely resulted from volume depletion. She showed significant improvement with the administration of IV fluids. During this hospitalization Namenda and Aricept were discontinued. It is possible that Aricept may have contributed to GI symptoms. By 11/16/2015 she reported feeling much better, tolerating by mouth intake. Lab work had not shown an obvious source of infection. MRI of brain was negative. Transthoracic echocardiogram showed ejection fraction of 45-50%, unchanged from transthoracic echocardiogram performed on 11/08/2014. Given clinical stability she was discharged on 11/16/2015 to her home. Please follow-up on volume status as she was instructed to stop Lasix on discharge.   Procedures:  Transthoracic echocardiogram performed on 11/15/2015 Impression: - Left ventricle: The cavity size was normal. There was moderate  focal basal and mild concentric hypertrophy. Systolic function  was mildly reduced. The estimated ejection fraction was in the  range of 45% to 50%. Diffuse hypokinesis. There was an increased  relative contribution of atrial contraction to ventricular  filling. Doppler parameters are consistent with abnormal left  ventricular relaxation (grade 1 diastolic dysfunction). - Ventricular septum: Septal motion showed paradox. These changes  are consistent with intraventricular conduction delay. - Aortic valve: There  was mild regurgitation. - Left atrium: The atrium was mildly dilated. - Tricuspid valve: There was mild regurgitation.  Consultations:    Discharge Exam: Filed Vitals:   11/16/15 0557 11/16/15 1033  BP: 138/67   Pulse: 78 69  Temp: 97.7 F (36.5 C)   Resp: 16 16    General: Calm, cooperative, no acute distress Cardiovascular: Regular rate and rhythm normal S1-S2 Respiratory: Normal respiratory effort Abdomen: Soft nontender nondistended Extremities: No edema  Discharge Instructions   Discharge Instructions    Call MD for:  difficulty breathing, headache or visual disturbances    Complete by:  As directed      Call MD for:  extreme fatigue    Complete by:  As directed      Call MD for:  hives    Complete by:  As directed      Call MD for:  persistant dizziness or light-headedness    Complete by:  As directed      Call MD for:  persistant nausea and vomiting    Complete by:  As directed      Call MD for:  redness, tenderness, or signs of infection (pain, swelling, redness, odor or green/yellow discharge around incision site)    Complete by:  As directed      Call MD for:  severe uncontrolled pain    Complete by:  As directed      Call MD for:  temperature >100.4    Complete by:  As directed      Call MD for:    Complete by:  As directed      Diet - low sodium heart healthy    Complete by:  As directed      Increase activity slowly    Complete by:  As directed           Current Discharge Medication List    CONTINUE these medications which have NOT CHANGED   Details  albuterol (PROVENTIL HFA;VENTOLIN HFA) 108 (90 Base) MCG/ACT inhaler Inhale 2 puffs into the lungs every 6 (six) hours as needed for wheezing or shortness of breath. Qty: 1 Inhaler, Refills: 11   Associated Diagnoses: Chronic obstructive pulmonary disease, unspecified COPD type (HCC)    ALPRAZolam (XANAX) 0.5 MG tablet Take 1 tablet (0.5 mg total) by mouth 2 (two) times daily as needed for  anxiety. Qty: 30 tablet, Refills: 2    aspirin EC 81 MG tablet Take 81 mg by mouth daily.    buPROPion (WELLBUTRIN SR) 150 MG 12 hr tablet Take 1 tablet (150 mg total) by mouth 2 (two) times daily. Qty: 180 tablet, Refills: 1   Associated Diagnoses: Stopped smoking    carvedilol (COREG) 6.25 MG tablet Take 1 tablet (6.25 mg total) by mouth 2 (two) times daily with a meal. Qty: 60 tablet, Refills: 6    cetirizine (ZYRTEC) 10 MG tablet Take 0.5 tablets (5 mg total) by mouth at bedtime. Qty: 90 tablet, Refills: 3   Associated Diagnoses: Allergic rhinitis, unspecified allergic rhinitis type    esomeprazole (NEXIUM) 40 MG capsule Take 1 capsule (40 mg total) by mouth daily before breakfast. Qty: 90 capsule, Refills: 1   Associated Diagnoses: Gastroesophageal reflux disease, esophagitis presence not specified    fluticasone (FLONASE) 50 MCG/ACT nasal spray Place 2 sprays into both nostrils daily. Qty: 16 g, Refills: 2   Associated Diagnoses: Allergic rhinitis, unspecified allergic rhinitis type    fluticasone furoate-vilanterol (BREO ELLIPTA) 200-25 MCG/INH AEPB Inhale 1 puff into  the lungs daily. Qty: 30 each, Refills: 1    losartan (COZAAR) 50 MG tablet Take 0.5 tablets (25 mg total) by mouth daily. Qty: 90 tablet, Refills: 1   Associated Diagnoses: Essential hypertension    Multiple Vitamins-Minerals (CENTRUM SILVER PO) Take 1 tablet by mouth daily.    ondansetron (ZOFRAN ODT) 4 MG disintegrating tablet Take 1 tablet (4 mg total) by mouth every 8 (eight) hours as needed for nausea or vomiting. Qty: 20 tablet, Refills: 0   Associated Diagnoses: Nausea without vomiting    raloxifene (EVISTA) 60 MG tablet Take 1 tablet (60 mg total) by mouth daily. Qty: 90 tablet, Refills: 1   Associated Diagnoses: Osteoporosis    SYMBICORT 160-4.5 MCG/ACT inhaler Inhale 1 puff into the lungs 2 (two) times daily.    vitamin B-12 (CYANOCOBALAMIN) 1000 MCG tablet Take 1,000 mcg by mouth daily.       STOP taking these medications     Biotin 5000 MCG CAPS      cyclobenzaprine (FLEXERIL) 5 MG tablet      donepezil (ARICEPT) 10 MG tablet      furosemide (LASIX) 20 MG tablet      ibuprofen (ADVIL,MOTRIN) 800 MG tablet      memantine (NAMENDA) 10 MG tablet        Allergies  Allergen Reactions  . Lisinopril-Hydrochlorothiazide Other (See Comments)    Lip swelling and cough. Stopped by ENT. Patient is on Losartan without any issues   . Codeine Itching and Swelling    eye irritation (prescribed percocet)  . Penicillins Itching and Swelling    Has patient had a PCN reaction causing immediate rash, facial/tongue/throat swelling, SOB or lightheadedness with hypotension: Yes Has patient had a PCN reaction causing severe rash involving mucus membranes or skin necrosis: No Has patient had a PCN reaction that required hospitalization No Has patient had a PCN reaction occurring within the last 10 years: No If all of the above answers are "NO", then may proceed with Cephalosporin use. eye irritation   Follow-up Information    Follow up with [name], MD In 1 week.   Specialty:  Pediatrics   Contact information:   [address] [place] [address] [phone]        The results of significant diagnostics from this hospitalization (including imaging, microbiology, ancillary and laboratory) are listed below for reference.    Significant Diagnostic Studies: Dg Chest 2 View  11/14/2015  CLINICAL DATA:  Left-sided chest pain, shortness of breath, nausea, and vomiting. EXAM: CHEST  2 VIEW COMPARISON:  Chest radiographs and CTA 02/21/2015 FINDINGS: The cardiac silhouette remains mildly enlarged. Thoracic aortic atherosclerosis is again noted. The patient has taken a slightly shallower inspiration than on the prior radiographs, and there are mild bibasilar opacities bilaterally, left worse than right. There is no evidence of pulmonary edema, pleural effusion, or pneumothorax.  Degenerative changes are noted at the shoulders. Right upper quadrant abdominal surgical clips are present. IMPRESSION: 1. Bibasilar opacities favored to reflect atelectasis.  No edema. 2. Aortic atherosclerosis. Electronically Signed   By: Sebastian Ache M.D.   On: 11/14/2015 18:15   Dg Cervical Spine Complete  10/18/2015  CLINICAL DATA:  Fall.  Initial evaluation. EXAM: CERVICAL SPINE - COMPLETE 4+ VIEW COMPARISON:  04/19/2015 . FINDINGS: Diffuse degenerative change cervical spine. No acute bony abnormality identified. Normal alignment. Pulmonary apices are clear. IMPRESSION: Diffuse multilevel degenerative change.  No acute abnormality. Electronically Signed   By: Maisie Fus  Register  On: 10/18/2015 08:13   Dg Thoracic Spine 2 View  10/18/2015  CLINICAL DATA:  Status post fall.  Thoracic spine pain. EXAM: THORACIC SPINE 2 VIEWS COMPARISON:  CT chest 02/21/2015. FINDINGS: No fracture or malalignment is identified. Mild S-shaped thoracolumbar scoliosis is noted. Paraspinous structures are unremarkable. IMPRESSION: No acute finding. Electronically Signed   By: Drusilla Kanner M.D.   On: 10/18/2015 08:09   Ct Head Wo Contrast  11/14/2015  CLINICAL DATA:  Status post fall. Complaining of neck pain. Confusion. EXAM: CT HEAD WITHOUT CONTRAST CT CERVICAL SPINE WITHOUT CONTRAST TECHNIQUE: Multidetector CT imaging of the head and cervical spine was performed following the standard protocol without intravenous contrast. Multiplanar CT image reconstructions of the cervical spine were also generated. COMPARISON:  Cervical spine radiograph 10/17/2015, brain MRI 08/11/2015 FINDINGS: CT HEAD FINDINGS Brain parenchyma: Normal for age. No intraparenchymal hemorrhage or evidence of acute cortical infarct. No mass lesion or midline shift. Ventricles, sulci and extra-axial spaces: Normal for age. No extra-axial collection. Paranasal sinuses and mastoids: Normal. Visualized orbits: Normal. Skull and extracranial soft tissues:  Small amount of right frontal scalp soft tissue swelling. No fracture identified. CT CERVICAL SPINE FINDINGS There is no acute fracture or static subluxation of the cervical spine. Facet articulations are normally aligned. There is advanced degenerative change at the left C1-2 articulation. There is multilevel moderate to severe cyst facet hypertrophy, left worse than right. No advanced spinal canal stenosis. No severe neural foraminal stenosis is seen. There is a 4 mm right upper lobe pulmonary nodule. Otherwise, paraspinal soft tissues are normal. Bilateral temporomandibular joint degenerative changes. Atherosclerotic calcification noted within the bilateral carotid arteries. IMPRESSION: Head CT: 1. No acute intracranial abnormality.  Normal head CT for age. 2. Small right frontal scalp soft tissue swelling. Cervical spine CT: 1. No acute fracture or static subluxation of the cervical spine. 2. Multilevel left-greater-than-right facet arthrosis. 3. 4 mm right upper lobe pulmonary nodule. No follow-up needed if patient is low-risk. Non-contrast chest CT can be considered in 12 months if patient is high-risk. This recommendation follows the consensus statement: Guidelines for Management of Incidental Pulmonary Nodules Detected on CT Images: From the Fleischner Society 2017; published online before print (10.1148/[id number]). Electronically Signed   By: Deatra Robinson M.D.   On: 11/14/2015 20:32   Ct Cervical Spine Wo Contrast  11/14/2015  CLINICAL DATA:  Status post fall. Complaining of neck pain. Confusion. EXAM: CT HEAD WITHOUT CONTRAST CT CERVICAL SPINE WITHOUT CONTRAST TECHNIQUE: Multidetector CT imaging of the head and cervical spine was performed following the standard protocol without intravenous contrast. Multiplanar CT image reconstructions of the cervical spine were also generated. COMPARISON:  Cervical spine radiograph 10/17/2015, brain MRI 08/11/2015 FINDINGS: CT HEAD FINDINGS Brain parenchyma:  Normal for age. No intraparenchymal hemorrhage or evidence of acute cortical infarct. No mass lesion or midline shift. Ventricles, sulci and extra-axial spaces: Normal for age. No extra-axial collection. Paranasal sinuses and mastoids: Normal. Visualized orbits: Normal. Skull and extracranial soft tissues: Small amount of right frontal scalp soft tissue swelling. No fracture identified. CT CERVICAL SPINE FINDINGS There is no acute fracture or static subluxation of the cervical spine. Facet articulations are normally aligned. There is advanced degenerative change at the left C1-2 articulation. There is multilevel moderate to severe cyst facet hypertrophy, left worse than right. No advanced spinal canal stenosis. No severe neural foraminal stenosis is seen. There is a 4 mm right upper lobe pulmonary nodule. Otherwise, paraspinal soft tissues are normal. Bilateral temporomandibular joint degenerative  changes. Atherosclerotic calcification noted within the bilateral carotid arteries. IMPRESSION: Head CT: 1. No acute intracranial abnormality.  Normal head CT for age. 2. Small right frontal scalp soft tissue swelling. Cervical spine CT: 1. No acute fracture or static subluxation of the cervical spine. 2. Multilevel left-greater-than-right facet arthrosis. 3. 4 mm right upper lobe pulmonary nodule. No follow-up needed if patient is low-risk. Non-contrast chest CT can be considered in 12 months if patient is high-risk. This recommendation follows the consensus statement: Guidelines for Management of Incidental Pulmonary Nodules Detected on CT Images: From the Fleischner Society 2017; published online before print (10.1148/[id number]). Electronically Signed   By: Deatra Robinson M.D.   On: 11/14/2015 20:32   Mr Brain Wo Contrast  11/15/2015  CLINICAL DATA:  Initial evaluation for acute dizziness. EXAM: MRI HEAD WITHOUT CONTRAST TECHNIQUE: Multiplanar, multiecho pulse sequences of the brain and surrounding structures  were obtained without intravenous contrast. COMPARISON:  Prior CT from earlier the same day. Comparison also made with previous MRI from 08/11/2015. FINDINGS: Disproportionate biparietal cerebral volume loss present, stable from prior. Minimal for age patchy T2/FLAIR hyperintensity within the periventricular and deep white matter both cerebral hemispheres. No abnormal foci of restricted diffusion to suggest acute intracranial infarct. Gray-white matter differentiation maintained. Major intracranial vascular flow voids are preserved. No acute or chronic intracranial hemorrhage. No areas of chronic infarction. No mass lesion, midline shift, or mass effect. No hydrocephalus. No extra-axial fluid collection. Major dural sinuses are grossly patent. Craniocervical junction within normal limits. Grade 1 anterolisthesis of C3 on C4 without significant stenosis. Remainder the visualized upper cervical spine unremarkable. Pituitary gland within normal limits. No acute abnormality about the globes and orbits. Sequela prior lens extraction noted bilaterally. Paranasal sinuses are clear. No mastoid effusion. Inner ear structures grossly unremarkable. Bone marrow signal intensity within normal limits. No scalp soft tissue abnormality. IMPRESSION: 1. No acute intracranial infarct or other process identified. 2. Disproportionate biparietal cerebral volume loss, which can be seen in underlying Alzheimer's type dementia. 3. Otherwise unremarkable for age noncontrast brain MRI. Electronically Signed   By: Rise Mu M.D.   On: 11/15/2015 00:33    Microbiology: Recent Results (from the past 240 hour(s))  Urine culture     Status: None   Collection Time: 11/14/15  7:43 PM  Result Value Ref Range Status   Specimen Description URINE, CATHETERIZED  Final   Special Requests NONE  Final   Culture NO GROWTH  Final   Report Status 11/16/2015 FINAL  Final     Labs: Basic Metabolic Panel:  Recent Labs Lab  11/14/15 1730 11/15/15 0050 11/16/15 0404  NA 139 141 142  K 3.7 4.2 3.8  CL 105 109 109  CO2 27 26 24   GLUCOSE 99 85 83  BUN 13 9 7   CREATININE 1.17* 1.03* 0.95  CALCIUM 9.3 8.6* 8.3*   Liver Function Tests:  Recent Labs Lab 11/14/15 1820 11/16/15 0404  AST 26 22  ALT 20 16  ALKPHOS 86 83  BILITOT 0.3 0.3  PROT 6.3* 5.6*  ALBUMIN 3.6 3.0*    Recent Labs Lab 11/14/15 1820  LIPASE 17   No results for input(s): AMMONIA in the last 168 hours. CBC:  Recent Labs Lab 11/14/15 1730 11/14/15 1820 11/15/15 0050 11/16/15 0404  WBC 9.4  --  9.0 7.7  NEUTROABS  --  6.3  --   --   HGB 13.3  --  12.6 12.1  HCT 40.4  --  37.9 36.9  MCV  90.0  --  90.2 90.0  PLT 364  --  335 316   Cardiac Enzymes:  Recent Labs Lab 11/15/15 0050 11/15/15 0652 11/15/15 1249  TROPONINI 0.04* 0.04* 0.03*   BNP: BNP (last 3 results)  Recent Labs  02/21/15 1833  BNP 166.2*    ProBNP (last 3 results) No results for input(s): PROBNP in the last 8760 hours.  CBG:  Recent Labs Lab 11/14/15 1923  GLUCAP 150*       Signed:  Jeralyn Bennett MD.  [facility] 11/16/2015, 11:28 AM

## 2015-11-16 NOTE — Progress Notes (Signed)
PT Cancellation Note  Patient Details Name: [name] MRN: [id number] DOB: 1935-02-01   Cancelled Treatment:    Reason Eval/Treat Not Completed: PT screened, no needs identified, will sign off.  Pt has a RW, cane and HH shower head in shower.  Per pt and nurse, pt has been ambulating independently without device and without LOB in room.  No PT needs and pt is ready to go home and son can assist prn per pt.  Sign off.     Tawni Millers F 11/16/2015, 11:41 AM  [facility] [phone] (pager)

## 2015-11-17 NOTE — Telephone Encounter (Signed)
Left message on machine to call back  

## 2015-11-21 ENCOUNTER — Ambulatory Visit: Payer: Medicare Other | Admitting: Physician Assistant

## 2015-11-23 NOTE — Telephone Encounter (Signed)
I have left several messages for pt to return call, with no response.  Will wait for further communication from the pt.

## 2015-11-30 ENCOUNTER — Telehealth: Payer: Self-pay | Admitting: Pediatrics

## 2015-12-05 ENCOUNTER — Other Ambulatory Visit: Payer: Self-pay | Admitting: Pediatrics

## 2015-12-05 DIAGNOSIS — M069 Rheumatoid arthritis, unspecified: Secondary | ICD-10-CM

## 2015-12-05 NOTE — Telephone Encounter (Signed)
Called and discussed with daughter [name], pt with neck pain ongoing. No history of rheumatoid arthritis. Having trouble sleeping. Want to see pt in clinic before any referrals, if she needs a referral orthopedics may be more beneficial than rheumatology. OK to schedule Thursday, 30 min slot. Daughter may call in AM for appt.

## 2015-12-06 NOTE — Telephone Encounter (Signed)
Please address

## 2015-12-07 NOTE — Telephone Encounter (Signed)
I talked with her daughter 2 days ago. Pt is to see me this week. No referral for now, daughter and pt were ok with this plan.

## 2015-12-08 ENCOUNTER — Encounter: Payer: Self-pay | Admitting: Pediatrics

## 2015-12-08 ENCOUNTER — Ambulatory Visit (INDEPENDENT_AMBULATORY_CARE_PROVIDER_SITE_OTHER): Payer: Medicare Other | Admitting: Pediatrics

## 2015-12-08 VITALS — BP 139/76 | HR 65 | Temp 98.6°F | Ht 62.5 in | Wt 161.4 lb

## 2015-12-08 DIAGNOSIS — M436 Torticollis: Secondary | ICD-10-CM | POA: Diagnosis not present

## 2015-12-08 DIAGNOSIS — J449 Chronic obstructive pulmonary disease, unspecified: Secondary | ICD-10-CM | POA: Diagnosis not present

## 2015-12-08 DIAGNOSIS — M47812 Spondylosis without myelopathy or radiculopathy, cervical region: Secondary | ICD-10-CM

## 2015-12-08 DIAGNOSIS — I10 Essential (primary) hypertension: Secondary | ICD-10-CM | POA: Diagnosis not present

## 2015-12-08 MED ORDER — CYCLOBENZAPRINE HCL 5 MG PO TABS: 2.5000 mg | ORAL_TABLET | Freq: Three times a day (TID) | ORAL | 0 refills | Status: DC | PRN

## 2015-12-08 MED ORDER — IBUPROFEN 600 MG PO TABS: 600.0000 mg | ORAL_TABLET | Freq: Three times a day (TID) | ORAL | 0 refills | Status: DC | PRN

## 2015-12-08 NOTE — Patient Instructions (Signed)
Use heat, ice to neck Asper cream Ibuprofen up to 3 times a day as needed Neck range of motion like we talked about Chin to chest, then to Right side, then to Left side Look to the left, then to the R Ear tilt to R shoulder, then to L shoulder

## 2015-12-08 NOTE — Progress Notes (Signed)
    Subjective:    Patient ID: [name], female    DOB: 14-Feb-1935, 81 y.o.   MRN: [id number]  CC: Follow-up (Neck pain)   HPI: [name] is a 82 y.o. female presenting for Follow-up (Neck pain)  Ongoing for several months Hurts primarily L posterior side of neck, into L top of shoudler Fell apprx 6 weeks ago and had cervical spine films showing degenerative changes, was already hurting before then Admitted briefly after episode of syncope 3 weeks ago, had CT cervical spine then showing facet arthrosis Now pain getting worse over last couple of weeks Cant lift her head off the pillow without using her hands to help No shoulder pain, no hip pain or weakness, no headaches No fevers, no weight loss, normal appetite Normal strength in hands b/l Normal gait No pain running down arms No other joint pain, no hand or finger pain No other joint swelling Takes ibuprofen, has been helping some Heating pad also helping some    Relevant past medical, surgical, family and social history reviewed and updated. Interim medical history since our last visit reviewed. Allergies and medications reviewed and updated.  History  Smoking Status  . Current Every Day Smoker  . Packs/day: 0.10  . Years: 60.00  . Types: Cigarettes  Smokeless Tobacco  . Never Used    ROS: Per HPI      Objective:    BP 139/76   Pulse 65   Temp 98.6 F (37 C) (Oral)   Ht 5' 2.5" (1.588 m)   Wt 161 lb 6.4 oz (73.2 kg)   BMI 29.05 kg/m   Wt Readings from Last 3 Encounters:  12/08/15 161 lb 6.4 oz (73.2 kg)  11/15/15 158 lb 11.7 oz (72 kg)  11/08/15 159 lb 3.2 oz (72.2 kg)     Gen: NAD, alert, cooperative with exam, NCAT EYES: EOMI, no scleral injection or icterus ENT:  OP without erythema LYMPH: no cervical LAD CV: NRRR, normal S1/S2, no murmur, distal pulses 2+ b/l Resp: CTABL, no wheezes, normal WOB Ext: No edema, warm Neuro: Alert and oriented, strength equal b/l UE and LE,  coordination grossly normal MSK: TTP L side of cervical spine soft tissue, very tight SCM muscle, limited ROM with L-ward turn to apprx 10degrees, ROM with R-ward turn apprx 30 degrees, no increase in pain with chin to chest     Assessment & Plan:    [name] was seen today for follow-up med problems.  Diagnoses and all orders for this visit:  Torticollis Cervical spondylosis without myelopathy Worsening neck pain Very tight neck muscles esp L side CT cervical spine from 3 weeks ago with facet disease Cont NSAIDs, gentle ROM but stop before pain Heating pad Low dose trial muscle relaxant -     ibuprofen (ADVIL,MOTRIN) 600 MG tablet; Take 1 tablet (600 mg total) by mouth every 8 (eight) hours as needed. -     cyclobenzaprine (FLEXERIL) 5 MG tablet; Take 0.5-1 tablets (2.5-5 mg total) by mouth 3 (three) times daily as needed for muscle spasms.  Essential hypertension Adequate control, cont current meds  Chronic obstructive pulmonary disease, unspecified COPD type (HCC) Well controlled, cont meds   Follow up plan: 2 weeks  Rex Kras, MD [facility] 12/08/2015, 4:42 PM

## 2015-12-13 ENCOUNTER — Encounter: Payer: Self-pay | Admitting: Family Medicine

## 2015-12-13 ENCOUNTER — Ambulatory Visit (INDEPENDENT_AMBULATORY_CARE_PROVIDER_SITE_OTHER): Payer: Medicare Other | Admitting: Family Medicine

## 2015-12-13 VITALS — BP 138/74 | HR 84 | Temp 98.1°F | Ht 62.5 in | Wt 159.0 lb

## 2015-12-13 DIAGNOSIS — J441 Chronic obstructive pulmonary disease with (acute) exacerbation: Secondary | ICD-10-CM

## 2015-12-13 MED ORDER — PREDNISONE 20 MG PO TABS: 40.0000 mg | ORAL_TABLET | Freq: Every day | ORAL | 0 refills | Status: DC

## 2015-12-13 MED ORDER — AZITHROMYCIN 250 MG PO TABS: ORAL_TABLET | ORAL | 0 refills | Status: DC

## 2015-12-13 MED ORDER — ALBUTEROL SULFATE HFA 108 (90 BASE) MCG/ACT IN AERS: 2.0000 | INHALATION_SPRAY | Freq: Four times a day (QID) | RESPIRATORY_TRACT | 0 refills | Status: DC | PRN

## 2015-12-13 NOTE — Patient Instructions (Addendum)
Great to see you!  I have prescribed azithromycin, and antibiotic, finished all of the pills. They continue to work for 10 days. Take prednisone for cough, breathing, and it may help your pain.  Inhalers:   Take only breo one puff once a day for a maintenance inhaler. Albuterol (which I have sent) is a rescue inhaler for when you are coughing or short of breath. Symbicort will not be helpful for this.    Chronic Obstructive Pulmonary Disease Chronic obstructive pulmonary disease (COPD) is a common lung condition in which airflow from the lungs is limited. COPD is a general term that can be used to describe many different lung problems that limit airflow, including both chronic bronchitis and emphysema. If you have COPD, your lung function will probably never return to normal, but there are measures you can take to improve lung function and make yourself feel better. CAUSES   Smoking (common).  Exposure to secondhand smoke.  Genetic problems.  Chronic inflammatory lung diseases or recurrent infections. SYMPTOMS  Shortness of breath, especially with physical activity.  Deep, persistent (chronic) cough with a large amount of thick mucus.  Wheezing.  Rapid breaths (tachypnea).  Gray or bluish discoloration (cyanosis) of the skin, especially in your fingers, toes, or lips.  Fatigue.  Weight loss.  Frequent infections or episodes when breathing symptoms become much worse (exacerbations).  Chest tightness. DIAGNOSIS Your health care provider will take a medical history and perform a physical examination to diagnose COPD. Additional tests for COPD may include:  Lung (pulmonary) function tests.  Chest X-ray.  CT scan.  Blood tests. TREATMENT  Treatment for COPD may include:  Inhaler and nebulizer medicines. These help manage the symptoms of COPD and make your breathing more comfortable.  Supplemental oxygen. Supplemental oxygen is only helpful if you have a low oxygen  level in your blood.  Exercise and physical activity. These are beneficial for nearly all people with COPD.  Lung surgery or transplant.  Nutrition therapy to gain weight, if you are underweight.  Pulmonary rehabilitation. This may involve working with a team of health care providers and specialists, such as respiratory, occupational, and physical therapists. HOME CARE INSTRUCTIONS  Take all medicines (inhaled or pills) as directed by your health care provider.  Avoid over-the-counter medicines or cough syrups that dry up your airway (such as antihistamines) and slow down the elimination of secretions unless instructed otherwise by your health care provider.  If you are a smoker, the most important thing that you can do is stop smoking. Continuing to smoke will cause further lung damage and breathing trouble. Ask your health care provider for help with quitting smoking. He or she can direct you to community resources or hospitals that provide support.  Avoid exposure to irritants such as smoke, chemicals, and fumes that aggravate your breathing.  Use oxygen therapy and pulmonary rehabilitation if directed by your health care provider. If you require home oxygen therapy, ask your health care provider whether you should purchase a pulse oximeter to measure your oxygen level at home.  Avoid contact with individuals who have a contagious illness.  Avoid extreme temperature and humidity changes.  Eat healthy foods. Eating smaller, more frequent meals and resting before meals may help you maintain your strength.  Stay active, but balance activity with periods of rest. Exercise and physical activity will help you maintain your ability to do things you want to do.  Preventing infection and hospitalization is very important when you have COPD. Make  sure to receive all the vaccines your health care provider recommends, especially the pneumococcal and influenza vaccines. Ask your health care  provider whether you need a pneumonia vaccine.  Learn and use relaxation techniques to manage stress.  Learn and use controlled breathing techniques as directed by your health care provider. Controlled breathing techniques include:  Pursed lip breathing. Start by breathing in (inhaling) through your nose for 1 second. Then, purse your lips as if you were going to whistle and breathe out (exhale) through the pursed lips for 2 seconds.  Diaphragmatic breathing. Start by putting one hand on your abdomen just above your waist. Inhale slowly through your nose. The hand on your abdomen should move out. Then purse your lips and exhale slowly. You should be able to feel the hand on your abdomen moving in as you exhale.  Learn and use controlled coughing to clear mucus from your lungs. Controlled coughing is a series of short, progressive coughs. The steps of controlled coughing are: 1. Lean your head slightly forward. 2. Breathe in deeply using diaphragmatic breathing. 3. Try to hold your breath for 3 seconds. 4. Keep your mouth slightly open while coughing twice. 5. Spit any mucus out into a tissue. 6. Rest and repeat the steps once or twice as needed. SEEK MEDICAL CARE IF:  You are coughing up more mucus than usual.  There is a change in the color or thickness of your mucus.  Your breathing is more labored than usual.  Your breathing is faster than usual. SEEK IMMEDIATE MEDICAL CARE IF:  You have shortness of breath while you are resting.  You have shortness of breath that prevents you from:  Being able to talk.  Performing your usual physical activities.  You have chest pain lasting longer than 5 minutes.  Your skin color is more cyanotic than usual.  You measure low oxygen saturations for longer than 5 minutes with a pulse oximeter. MAKE SURE YOU:  Understand these instructions.  Will watch your condition.  Will get help right away if you are not doing well or get worse.    This information is not intended to replace advice given to you by your health care provider. Make sure you discuss any questions you have with your health care provider.   Document Released: 01/24/2005 Document Revised: 05/07/2014 Document Reviewed: 12/11/2012 Elsevier Interactive Patient Education [employer]

## 2015-12-13 NOTE — Progress Notes (Signed)
   HPI  Patient presents today here with cough.  Patient explains over the last 3-4 days she's had cough and congestion with increased phlegm above baseline. She also has shortness of breath. She denies chest pain, fever, chills, or food or fluid intolerance. She is still going to work. She does not use albuterol, she states that she uses breo daily and then uses Symbicort as needed.  She continues to have left-sided neck pain, it is slightly improved, however is continued.  PMH: Smoking status noted ROS: Per HPI  Objective: BP 138/74   Pulse 84   Temp 98.1 F (36.7 C) (Oral)   Ht 5' 2.5" (1.588 m)   Wt 159 lb (72.1 kg)   BMI 28.62 kg/m  Gen: NAD, alert, cooperative with exam HEENT: NCAT CV: RRR, good S1/S2, no murmur Resp: Nonlabored, expiratory wheeze focally located in the left lower lung field with some small amount of coarse breath sounds. Ext: No edema, warm Neuro: Alert and oriented, No gross deficits  Assessment and plan:  # COPD exacerbation Treated with azithromycin and prednisone Discussed appropriate use of inhalers, we discussed very carefully rescue versus preventative inhalers. Continue Breo, Sent albuterol RTC with any concerns    Meds ordered this encounter  Medications  . azithromycin (ZITHROMAX) 250 MG tablet    Sig: Take 2 tablets on day 1 and 1 tablet daily after that    Dispense:  6 tablet    Refill:  0  . predniSONE (DELTASONE) 20 MG tablet    Sig: Take 2 tablets (40 mg total) by mouth daily with breakfast.    Dispense:  10 tablet    Refill:  0  . albuterol (PROVENTIL HFA;VENTOLIN HFA) 108 (90 Base) MCG/ACT inhaler    Sig: Inhale 2 puffs into the lungs every 6 (six) hours as needed for wheezing or shortness of breath.    Dispense:  1 Inhaler    Refill:  0    Murtis Sink, MD [place] [facility] 12/13/2015, 4:43 PM

## 2015-12-17 ENCOUNTER — Other Ambulatory Visit: Payer: Self-pay | Admitting: Family

## 2015-12-17 DIAGNOSIS — R110 Nausea: Secondary | ICD-10-CM

## 2015-12-19 ENCOUNTER — Encounter: Payer: Self-pay | Admitting: Nurse Practitioner

## 2015-12-19 ENCOUNTER — Ambulatory Visit (INDEPENDENT_AMBULATORY_CARE_PROVIDER_SITE_OTHER): Payer: Medicare Other | Admitting: Nurse Practitioner

## 2015-12-19 VITALS — BP 147/81 | HR 72 | Temp 97.4°F | Ht 62.0 in | Wt 158.0 lb

## 2015-12-19 DIAGNOSIS — A084 Viral intestinal infection, unspecified: Secondary | ICD-10-CM | POA: Diagnosis not present

## 2015-12-19 DIAGNOSIS — R110 Nausea: Secondary | ICD-10-CM | POA: Diagnosis not present

## 2015-12-19 MED ORDER — ONDANSETRON 4 MG PO TBDP: 4.0000 mg | ORAL_TABLET | Freq: Three times a day (TID) | ORAL | 0 refills | Status: DC | PRN

## 2015-12-19 NOTE — Patient Instructions (Signed)

## 2015-12-19 NOTE — Progress Notes (Signed)
   Subjective:    Patient ID: [name], female    DOB: [birth year], 80 y.o.   MRN: [id number]  HPI Patient comes in today c/o nausea and vomiting- has not vomited since yesterday but is very nauseated- she also c/o feeling weak. SHe has the weakness c/o intermittently but all labs and test have been normal. SHe said this time she felt fine until went to church yesterday.    Review of Systems  Constitutional: Positive for appetite change (somewhat decreased).  HENT: Negative.   Respiratory: Negative.  Negative for shortness of breath.   Cardiovascular: Negative for chest pain and leg swelling.  Gastrointestinal: Positive for nausea and vomiting. Negative for constipation and diarrhea.  Genitourinary: Negative.   Neurological: Negative.   Psychiatric/Behavioral: Negative.   All other systems reviewed and are negative.      Objective:   Physical Exam  Constitutional: She is oriented to person, place, and time. She appears well-developed and well-nourished. No distress.  Cardiovascular: Normal rate, regular rhythm and normal heart sounds.   Pulmonary/Chest: Effort normal and breath sounds normal.  Abdominal: Soft. Bowel sounds are normal.  Neurological: She is alert and oriented to person, place, and time.  Skin: Skin is warm.  Psychiatric: She has a normal mood and affect. Her behavior is normal. Judgment and thought content normal.   BP (!) 147/81   Pulse 72   Temp 97.4 F (36.3 C) (Oral)   Ht '5\' 2"'$  (1.575 m)   Wt 158 lb (71.7 kg)   BMI 28.90 kg/m        Assessment & Plan:   1. Viral gastroenteritis   2. Nausea without vomiting    Meds ordered this encounter  Medications  . ondansetron (ZOFRAN ODT) 4 MG disintegrating tablet    Sig: Take 1 tablet (4 mg total) by mouth every 8 (eight) hours as needed for nausea or vomiting.    Dispense:  20 tablet    Refill:  0    Order Specific Question:   Supervising Provider    Answer:   Evette Doffing, CAROL L [[id number]]   Orders  Placed This Encounter  Procedures  . CMP14+EGFR  . CBC with Differential/Platelet   First 24 Hours-Clear liquids  popsicles  Jello  gatorade  Sprite Second 24 hours-Add Full liquids ( Liquids you cant see through) Third 24 hours- Bland diet ( foods that are baked or broiled)  *avoiding fried foods and highly spiced foods* During these 3 days  Avoid milk, cheese, ice cream or any other dairy products  Avoid caffeine- REMEMBER Mt. Dew and Mello Yellow contain lots of caffeine You should eat and drink in  Frequent small volumes If no improvement in symptoms or worsen in 2-3 days should RETRUN TO OFFICE or go to ER!    [name] Hassell Done, FNP

## 2015-12-20 LAB — CBC WITH DIFFERENTIAL/PLATELET
BASOS: 0 %
Basophils Absolute: 0 10*3/uL (ref 0.0–0.2)
EOS (ABSOLUTE): 0.2 10*3/uL (ref 0.0–0.4)
Eos: 2 %
HEMATOCRIT: 41.9 % (ref 34.0–46.6)
Hemoglobin: 13.7 g/dL (ref 11.1–15.9)
IMMATURE GRANULOCYTES: 0 %
Immature Grans (Abs): 0 10*3/uL (ref 0.0–0.1)
LYMPHS ABS: 3.8 10*3/uL — AB (ref 0.7–3.1)
LYMPHS: 43 %
MCH: 29.6 pg (ref 26.6–33.0)
MCHC: 32.7 g/dL (ref 31.5–35.7)
MCV: 91 fL (ref 79–97)
MONOS ABS: 0.4 10*3/uL (ref 0.1–0.9)
Monocytes: 4 %
Neutrophils Absolute: 4.4 10*3/uL (ref 1.4–7.0)
Neutrophils: 51 %
PLATELETS: 349 10*3/uL (ref 150–379)
RBC: 4.63 x10E6/uL (ref 3.77–5.28)
RDW: 15 % (ref 12.3–15.4)
WBC: 8.7 10*3/uL (ref 3.4–10.8)

## 2015-12-20 LAB — CMP14+EGFR
A/G RATIO: 1.6 (ref 1.2–2.2)
ALBUMIN: 3.9 g/dL (ref 3.5–4.8)
ALT: 24 IU/L (ref 0–32)
AST: 24 IU/L (ref 0–40)
Alkaline Phosphatase: 88 IU/L (ref 39–117)
BILIRUBIN TOTAL: 0.2 mg/dL (ref 0.0–1.2)
BUN / CREAT RATIO: 14 (ref 12–28)
BUN: 17 mg/dL (ref 8–27)
CALCIUM: 9 mg/dL (ref 8.7–10.3)
CHLORIDE: 102 mmol/L (ref 96–106)
CO2: 26 mmol/L (ref 18–29)
Creatinine, Ser: 1.22 mg/dL — ABNORMAL HIGH (ref 0.57–1.00)
GFR, EST AFRICAN AMERICAN: 49 mL/min/{1.73_m2} — AB (ref >59)
GFR, EST NON AFRICAN AMERICAN: 43 mL/min/{1.73_m2} — AB (ref >59)
GLOBULIN, TOTAL: 2.5 g/dL (ref 1.5–4.5)
Glucose: 89 mg/dL (ref 65–99)
POTASSIUM: 4.2 mmol/L (ref 3.5–5.2)
SODIUM: 143 mmol/L (ref 134–144)
TOTAL PROTEIN: 6.4 g/dL (ref 6.0–8.5)

## 2015-12-21 ENCOUNTER — Telehealth: Payer: Self-pay | Admitting: *Deleted

## 2015-12-21 ENCOUNTER — Ambulatory Visit: Payer: Medicare Other | Admitting: Neurology

## 2015-12-21 NOTE — Telephone Encounter (Signed)
Pt called to cancel the morning of her appt.  Stated she had a conflict with another doctor's appt.

## 2015-12-22 DIAGNOSIS — H52223 Regular astigmatism, bilateral: Secondary | ICD-10-CM | POA: Diagnosis not present

## 2015-12-22 DIAGNOSIS — H5201 Hypermetropia, right eye: Secondary | ICD-10-CM | POA: Diagnosis not present

## 2015-12-22 DIAGNOSIS — H524 Presbyopia: Secondary | ICD-10-CM | POA: Diagnosis not present

## 2015-12-22 DIAGNOSIS — H5212 Myopia, left eye: Secondary | ICD-10-CM | POA: Diagnosis not present

## 2015-12-24 ENCOUNTER — Emergency Department (HOSPITAL_COMMUNITY)
Admission: EM | Admit: 2015-12-24 | Discharge: 2015-12-24 | Disposition: A | Discharge status: Home / Self Care | Payer: Medicare Other | Attending: Emergency Medicine | Admitting: Emergency Medicine

## 2015-12-24 ENCOUNTER — Encounter (HOSPITAL_COMMUNITY): Payer: Self-pay | Admitting: Emergency Medicine

## 2015-12-24 DIAGNOSIS — Z791 Long term (current) use of non-steroidal anti-inflammatories (NSAID): Secondary | ICD-10-CM | POA: Insufficient documentation

## 2015-12-24 DIAGNOSIS — R200 Anesthesia of skin: Secondary | ICD-10-CM | POA: Diagnosis present

## 2015-12-24 DIAGNOSIS — Z79899 Other long term (current) drug therapy: Secondary | ICD-10-CM | POA: Insufficient documentation

## 2015-12-24 DIAGNOSIS — M5481 Occipital neuralgia: Secondary | ICD-10-CM

## 2015-12-24 DIAGNOSIS — I10 Essential (primary) hypertension: Secondary | ICD-10-CM | POA: Diagnosis not present

## 2015-12-24 DIAGNOSIS — Z7982 Long term (current) use of aspirin: Secondary | ICD-10-CM | POA: Diagnosis not present

## 2015-12-24 DIAGNOSIS — J449 Chronic obstructive pulmonary disease, unspecified: Secondary | ICD-10-CM | POA: Diagnosis not present

## 2015-12-24 DIAGNOSIS — F17210 Nicotine dependence, cigarettes, uncomplicated: Secondary | ICD-10-CM | POA: Diagnosis not present

## 2015-12-24 NOTE — ED Triage Notes (Signed)
Pt states L sided head numbness started 2 hours ago, states weakness. Denies HA, LOC, dizziness, or other body numbness.

## 2015-12-24 NOTE — ED Provider Notes (Signed)
[facility] Provider Note   CSN: [id number] Arrival date & time: 12/24/15  1300   By signing my name below, I, Christy Sartorius, attest that this documentation has been prepared under the direction and in the presence of Raeford Razor, MD . Electronically Signed: Christy Sartorius, Scribe. 12/24/2015. 1:19 PM.  History   Chief Complaint Chief Complaint  Patient presents with  . Other    left sided head numbness   The history is provided by the patient. No language interpreter was used.    HPI Comments:  [name] is a 81 y.o. female who presents to the Emergency Department complaining of numbness to the left side of her scalp behind her left ear beginning this morning.  She states she noticed it when she was unable to feel sensation in that area while putting on her shower cap.  She denies pain, weakness, and change in vision.  No alleviating factors noted.  No additional symptoms or complaints.    Past Medical History:  Diagnosis Date  . Allergic rhinitis   . Anxiety   . Arthritis   . Cataract    had eye surgery  . Depression   . Essential hypertension, benign   . GERD (gastroesophageal reflux disease)   . History of blood transfusion   . Left bundle branch block   . Memory loss   . Mixed hyperlipidemia   . Osteopenia   . Secondary cardiomyopathy (HCC)    LVEF 40-45%, likely nonischemic    Patient Active Problem List   Diagnosis Date Noted  . Syncope 11/15/2015  . Nausea and vomiting 11/15/2015  . Dehydration 10/05/2015  . UTI (lower urinary tract infection) 10/05/2015  . Dementia 09/13/2015  . Memory problem 05/07/2015  . COPD (chronic obstructive pulmonary disease) (HCC) 04/19/2015  . Neck pain 04/06/2015  . Insomnia 12/10/2014  . BMI 28.0-28.9,adult 12/10/2014  . Osteopenia 05/19/2014  . GAD (generalized anxiety disorder) 01/06/2014  . Depression 01/06/2014  . GERD (gastroesophageal reflux disease) 01/06/2014  . Vitamin D deficiency  01/06/2014  . Overactive bladder 01/06/2014  . Secondary cardiomyopathy (HCC) 10/06/2013  . Left bundle branch block 10/06/2013  . Dizziness 10/04/2013  . Osteoarthritis of right hip 03/14/2013  . Allergic rhinitis 12/07/2008  . Chronic cough 12/07/2008  . Hepatitis B virus infection 08/16/2008  . Hyperlipidemia 08/16/2008  . Essential hypertension 08/16/2008  . Osteoarthritis 08/16/2008  . DISC DEGENERATION 07/24/2007    Past Surgical History:  Procedure Laterality Date  . APPENDECTOMY    . BACK SURGERY    . CARDIAC CATHETERIZATION  2010   Normal coronaries  . CATARACT EXTRACTION W/PHACO  04/16/2011   Procedure: CATARACT EXTRACTION PHACO AND INTRAOCULAR LENS PLACEMENT (IOC);  Surgeon: Susa Simmonds;  Location: [facility];  Service: Ophthalmology;  Laterality: Left;  CDE=11.35  . CATARACT EXTRACTION W/PHACO  01/28/2012   Procedure: CATARACT EXTRACTION PHACO AND INTRAOCULAR LENS PLACEMENT (IOC);  Surgeon: Susa Simmonds, MD;  Location: [facility];  Service: Ophthalmology;  Laterality: Right;  CDI:8.15  . CYSTO WITH HYDRODISTENSION  05/29/2012   Procedure: CYSTOSCOPY/HYDRODISTENSION;  Surgeon: Martina Sinner, MD;  Location: [facility];  Service: Urology;  Laterality: N/A;  INSTILLATION OF MARCAINE AND PYRIDIUM   . DILATION AND CURETTAGE OF UTERUS  ?<hysterectomy"  . HAMMER TOE SURGERY Bilateral 11/22/11   [facility], [name]  . JOINT REPLACEMENT    . LAPAROSCOPIC CHOLECYSTECTOMY  1991  . LUMBAR LAMINECTOMY  2011  . LUMBAR LAMINECTOMY/DECOMPRESSION MICRODISCECTOMY Left 06/06/2012   Procedure:  LUMBAR LAMINECTOMY/DECOMPRESSION MICRODISCECTOMY 1 LEVEL;  Surgeon: Maeola HarmanJoseph Stern, MD;  Location: [facility];  Service: Neurosurgery;  Laterality: Left;  Left Lumbar two-three Laminectomy for resection of synovial cyst  . SHOULDER OPEN ROTATOR CUFF REPAIR Right 2011  . TONSILLECTOMY    . TOTAL ABDOMINAL HYSTERECTOMY    . TOTAL HIP ARTHROPLASTY Right 03/13/2013   Procedure: TOTAL HIP  ARTHROPLASTY;  Surgeon: Nestor LewandowskyFrank J Rowan, MD;  Location: [facility];  Service: Orthopedics;  Laterality: Right;    OB History    No data available       Home Medications    Prior to Admission medications   Medication Sig Start Date End Date Taking? Authorizing Provider  albuterol (PROVENTIL HFA;VENTOLIN HFA) 108 (90 Base) MCG/ACT inhaler Inhale 2 puffs into the lungs every 6 (six) hours as needed for wheezing or shortness of breath. 12/13/15   Elenora GammaSamuel L Bradshaw, MD  ALPRAZolam Prudy Feeler(XANAX) 0.5 MG tablet Take 1 tablet (0.5 mg total) by mouth 2 (two) times daily as needed for anxiety. 10/18/15   Junie Spencerhristy A Hawks, FNP  aspirin EC 81 MG tablet Take 81 mg by mouth daily.    Historical Provider, MD  buPROPion (WELLBUTRIN SR) 150 MG 12 hr tablet Take 1 tablet (150 mg total) by mouth 2 (two) times daily. Patient taking differently: Take 150 mg by mouth daily.  10/13/15   Junie Spencerhristy A Hawks, FNP  carvedilol (COREG) 6.25 MG tablet Take 1 tablet (6.25 mg total) by mouth 2 (two) times daily with a meal. 10/13/15   Junie Spencerhristy A Hawks, FNP  cetirizine (ZYRTEC) 10 MG tablet Take 0.5 tablets (5 mg total) by mouth at bedtime. 11/08/15   Junie Spencerhristy A Hawks, FNP  cyclobenzaprine (FLEXERIL) 5 MG tablet Take 0.5-1 tablets (2.5-5 mg total) by mouth 3 (three) times daily as needed for muscle spasms. 12/08/15   Johna Sheriffarol L Vincent, MD  esomeprazole (NEXIUM) 40 MG capsule Take 1 capsule (40 mg total) by mouth daily before breakfast. 10/13/15   Junie Spencerhristy A Hawks, FNP  fluticasone (FLONASE) 50 MCG/ACT nasal spray Place 2 sprays into both nostrils daily. 11/08/15   Junie Spencerhristy A Hawks, FNP  fluticasone furoate-vilanterol (BREO ELLIPTA) 200-25 MCG/INH AEPB Inhale 1 puff into the lungs daily. 10/26/15   Henrene Pastorammy Eckard, PharmD  ibuprofen (ADVIL,MOTRIN) 600 MG tablet Take 1 tablet (600 mg total) by mouth every 8 (eight) hours as needed. 12/08/15   Johna Sheriffarol L Vincent, MD  losartan (COZAAR) 50 MG tablet Take 0.5 tablets (25 mg total) by mouth daily. 10/13/15   Junie Spencerhristy A  Hawks, FNP  Multiple Vitamins-Minerals (CENTRUM SILVER PO) Take 1 tablet by mouth daily.    Historical Provider, MD  ondansetron (ZOFRAN ODT) 4 MG disintegrating tablet Take 1 tablet (4 mg total) by mouth every 8 (eight) hours as needed for nausea or vomiting. 12/19/15   Mary-Margaret Daphine DeutscherMartin, FNP  raloxifene (EVISTA) 60 MG tablet Take 1 tablet (60 mg total) by mouth daily. 10/13/15   Junie Spencerhristy A Hawks, FNP  SYMBICORT 160-4.5 MCG/ACT inhaler Inhale 1 puff into the lungs 2 (two) times daily. 10/13/15   Historical Provider, MD  vitamin B-12 (CYANOCOBALAMIN) 1000 MCG tablet Take 1,000 mcg by mouth daily.    Historical Provider, MD    Family History Family History  Problem Relation Age of Onset  . Diabetes Father   . Diabetes Brother   . Cancer Brother [id number]    colon  . Diabetes Brother   . Diabetes Brother   . Hypertension Sister   . Diabetes Sister   .  Diabetes Sister   . Diabetes Other   . Allergies Other   . Colon cancer Neg Hx   . Anesthesia problems Neg Hx   . Hypotension Neg Hx   . Malignant hyperthermia Neg Hx   . Pseudochol deficiency Neg Hx     Social History Social History  Substance Use Topics  . Smoking status: Current Some Day Smoker    Packs/day: 0.10    Years: 60.00    Types: Cigarettes  . Smokeless tobacco: Never Used  . Alcohol use No     Allergies   Lisinopril-hydrochlorothiazide; Codeine; and Penicillins   Review of Systems Review of Systems  Eyes: Negative for visual disturbance.  Neurological: Positive for numbness. Negative for weakness.  All other systems reviewed and are negative.    Physical Exam Updated Vital Signs BP 136/68 (BP Location: Left Arm)   Pulse 74   Temp 98.2 F (36.8 C) (Oral)   Resp 15   Ht 5\' 4"  (1.626 m)   Wt 158 lb (71.7 kg)   SpO2 97%   BMI 27.12 kg/m   Physical Exam  Constitutional: She is oriented to person, place, and time. She appears well-developed and well-nourished. No distress.  HENT:  Head: Normocephalic and  atraumatic.  Eyes: Conjunctivae are normal.  Cardiovascular: Normal rate.   Pulmonary/Chest: Effort normal.  Neurological: She is alert and oriented to person, place, and time. She displays normal reflexes. No cranial nerve deficit. She exhibits normal muscle tone. Coordination normal.  Good figure nose testing bilaterally. Altered sensation in the left occipital nerve region.  Skin: Skin is warm and dry.  Psychiatric: She has a normal mood and affect.  Nursing note and vitals reviewed.    ED Treatments / Results   DIAGNOSTIC STUDIES:  Oxygen Saturation is 97% on RA, nml by my interpretation.    COORDINATION OF CARE:  1:19 PM Discussed treatment plan with pt at bedside and pt agreed to plan.  Labs (all labs ordered are listed, but only abnormal results are displayed) Labs Reviewed - No data to display  EKG  EKG Interpretation None       Radiology No results found.  Procedures Procedures (including critical care time)  Medications Ordered in ED Medications - No data to display   Initial Impression / Assessment and Plan / ED Course  I have reviewed the triage vital signs and the nursing notes.  Pertinent labs & imaging results that were available during my care of the patient were reviewed by me and considered in my medical decision making (see chart for details).  Clinical Course    80yF with symptoms consistent with L occipital neuralgia. Primarily numbness. Is tender on palpation in region of occipital nerves though. I typically offer occipital nerve blocks if having a lot of pain, but since her symptoms consist primarily of numbness I think a "watch and wait" approach is more reasonable. Neuro exam is non focal otherwise. It has been determined that no acute conditions requiring further emergency intervention are present at this time. The patient has been advised of the diagnosis and plan. I reviewed any labs and imaging including any potential incidental  findings. We have discussed signs and symptoms that warrant return to the ED and they are listed in the discharge instructions.    Final Clinical Impressions(s) / ED Diagnoses   Final diagnoses:  Occipital neuralgia of left side    New Prescriptions New Prescriptions   No medications on file   I personally preformed  the services scribed in my presence. The recorded information has been reviewed is accurate. Raeford Razor, MD.     Raeford Razor, MD 01/08/16 1019

## 2016-01-09 ENCOUNTER — Ambulatory Visit (INDEPENDENT_AMBULATORY_CARE_PROVIDER_SITE_OTHER): Payer: Medicare Other | Admitting: Family Medicine

## 2016-01-09 ENCOUNTER — Encounter: Payer: Self-pay | Admitting: Family Medicine

## 2016-01-09 VITALS — BP 134/79 | HR 84 | Temp 98.2°F | Ht 64.0 in | Wt 157.4 lb

## 2016-01-09 DIAGNOSIS — J441 Chronic obstructive pulmonary disease with (acute) exacerbation: Secondary | ICD-10-CM | POA: Diagnosis not present

## 2016-01-09 MED ORDER — ONDANSETRON HCL 4 MG PO TABS: 4.0000 mg | ORAL_TABLET | Freq: Three times a day (TID) | ORAL | 0 refills | Status: DC | PRN

## 2016-01-09 MED ORDER — ALBUTEROL SULFATE HFA 108 (90 BASE) MCG/ACT IN AERS
2.0000 | INHALATION_SPRAY | Freq: Four times a day (QID) | RESPIRATORY_TRACT | 0 refills | Status: DC | PRN
Start: 2016-01-09 — End: 2016-12-24

## 2016-01-09 MED ORDER — PREDNISONE 20 MG PO TABS: ORAL_TABLET | ORAL | 0 refills | Status: DC

## 2016-01-09 MED ORDER — AZITHROMYCIN 250 MG PO TABS: ORAL_TABLET | ORAL | 0 refills | Status: DC

## 2016-01-09 MED ORDER — FLUTICASONE FUROATE-VILANTEROL 200-25 MCG/INH IN AEPB: 1.0000 | INHALATION_SPRAY | Freq: Every day | RESPIRATORY_TRACT | 1 refills | Status: DC

## 2016-01-09 NOTE — Progress Notes (Signed)
BP 134/79   Pulse 84   Temp 98.2 F (36.8 C) (Oral)   Ht 5\' 4"  (1.626 m)   Wt 157 lb 6.4 oz (71.4 kg)   BMI 27.02 kg/m    Subjective:    Patient ID: , female    DOB: 1934/11/30, 81 y.o.   MRN: [id number]  HPI: [name] is a 81 y.o. female presenting on 01/09/2016 for Cough; Runny nose and sinus drainage; and Nausea (has taken Zofran but it is not helping)   HPI Cough and congestion and sinus drainage Patient has been having cough and sinus congestion and nasal drainage and postnasal drainage and ear pressure that is been increasing over the past 3 days. She has continued to use her Flonase and her antihistamine and her Symbicort but she ran out of her pro-air and debris oh that she was on previously. She denies any fevers or chills. She denies any shortness of breath or wheezing. She has been having some coughing spells that are mostly dry and nonproductive. She has used some over-the-counter cough suppressant that seemed to help some.  Relevant past medical, surgical, family and social history reviewed and updated as indicated. Interim medical history since our last visit reviewed. Allergies and medications reviewed and updated.  Review of Systems  Constitutional: Negative for chills and fever.  HENT: Positive for congestion, postnasal drip, rhinorrhea, sinus pressure, sneezing and sore throat. Negative for ear discharge and ear pain.   Eyes: Negative for pain, redness and visual disturbance.  Respiratory: Positive for cough. Negative for chest tightness, shortness of breath and wheezing.   Cardiovascular: Negative for chest pain and leg swelling.  Genitourinary: Negative for difficulty urinating and dysuria.  Musculoskeletal: Negative for back pain and gait problem.  Skin: Negative for rash.  Neurological: Negative for light-headedness and headaches.  Psychiatric/Behavioral: Negative for agitation and behavioral problems.  All other systems reviewed and  are negative.   Per HPI unless specifically indicated above     Medication List       Accurate as of 01/09/16  3:43 PM. Always use your most recent med list.          albuterol 108 (90 Base) MCG/ACT inhaler Commonly known as:  PROVENTIL HFA;VENTOLIN HFA Inhale 2 puffs into the lungs every 6 (six) hours as needed for wheezing or shortness of breath.   ALPRAZolam 0.5 MG tablet Commonly known as:  XANAX Take 1 tablet (0.5 mg total) by mouth 2 (two) times daily as needed for anxiety.   aspirin EC 81 MG tablet Take 81 mg by mouth daily.   azithromycin 250 MG tablet Commonly known as:  ZITHROMAX Take 2 the first day and then one each day after.   buPROPion 150 MG 12 hr tablet Commonly known as:  WELLBUTRIN SR Take 1 tablet (150 mg total) by mouth 2 (two) times daily.   carvedilol 6.25 MG tablet Commonly known as:  COREG Take 1 tablet (6.25 mg total) by mouth 2 (two) times daily with a meal.   CENTRUM SILVER PO Take 1 tablet by mouth daily.   cetirizine 10 MG tablet Commonly known as:  ZYRTEC Take 0.5 tablets (5 mg total) by mouth at bedtime.   cyclobenzaprine 5 MG tablet Commonly known as:  FLEXERIL Take 0.5-1 tablets (2.5-5 mg total) by mouth 3 (three) times daily as needed for muscle spasms.   esomeprazole 40 MG capsule Commonly known as:  NEXIUM Take 1 capsule (40 mg total) by mouth daily  before breakfast.   fluticasone 50 MCG/ACT nasal spray Commonly known as:  FLONASE Place 2 sprays into both nostrils daily.   fluticasone furoate-vilanterol 200-25 MCG/INH Aepb Commonly known as:  BREO ELLIPTA Inhale 1 puff into the lungs daily.   ibuprofen 600 MG tablet Commonly known as:  ADVIL,MOTRIN Take 1 tablet (600 mg total) by mouth every 8 (eight) hours as needed.   losartan 50 MG tablet Commonly known as:  COZAAR Take 0.5 tablets (25 mg total) by mouth daily.   predniSONE 20 MG tablet Commonly known as:  DELTASONE 2 po at same time daily for 5 days     raloxifene 60 MG tablet Commonly known as:  EVISTA Take 1 tablet (60 mg total) by mouth daily.   vitamin B-12 1000 MCG tablet Commonly known as:  CYANOCOBALAMIN Take 1,000 mcg by mouth daily.          Objective:    BP 134/79   Pulse 84   Temp 98.2 F (36.8 C) (Oral)   Ht 5\' 4"  (1.626 m)   Wt 157 lb 6.4 oz (71.4 kg)   BMI 27.02 kg/m   Wt Readings from Last 3 Encounters:  01/09/16 157 lb 6.4 oz (71.4 kg)  12/24/15 158 lb (71.7 kg)  12/19/15 158 lb (71.7 kg)    Physical Exam  Constitutional: She is oriented to person, place, and time. She appears well-developed and well-nourished. No distress.  HENT:  Right Ear: Tympanic membrane, external ear and ear canal normal.  Left Ear: Tympanic membrane, external ear and ear canal normal.  Nose: Mucosal edema and rhinorrhea present. No epistaxis. Right sinus exhibits no maxillary sinus tenderness and no frontal sinus tenderness. Left sinus exhibits no maxillary sinus tenderness and no frontal sinus tenderness.  Mouth/Throat: Uvula is midline and mucous membranes are normal. Posterior oropharyngeal edema and posterior oropharyngeal erythema present. No oropharyngeal exudate or tonsillar abscesses.  Eyes: Conjunctivae are normal.  Neck: Neck supple. No thyromegaly present.  Cardiovascular: Normal rate, regular rhythm, normal heart sounds and intact distal pulses.   No murmur heard. Pulmonary/Chest: Effort normal and breath sounds normal. No respiratory distress. She has no wheezes. She has no rales. She exhibits no tenderness.  Musculoskeletal: Normal range of motion. She exhibits no edema or tenderness.  Lymphadenopathy:    She has no cervical adenopathy.  Neurological: She is alert and oriented to person, place, and time. Coordination normal.  Skin: Skin is warm and dry. No rash noted. She is not diaphoretic.  Psychiatric: She has a normal mood and affect. Her behavior is normal.  Vitals reviewed.     Assessment & Plan:    Problem List Items Addressed This Visit    None    Visit Diagnoses    COPD exacerbation (HCC)    -  Primary   Relevant Medications   albuterol (PROVENTIL HFA;VENTOLIN HFA) 108 (90 Base) MCG/ACT inhaler   fluticasone furoate-vilanterol (BREO ELLIPTA) 200-25 MCG/INH AEPB   azithromycin (ZITHROMAX) 250 MG tablet   predniSONE (DELTASONE) 20 MG tablet       Follow up plan: Return in about 2 months (around 03/10/2016), or if symptoms worsen or fail to improve, for Follow-up COPD.  Counseling provided for all of the vaccine components No orders of the defined types were placed in this encounter.   13/02/2016, MD [facility] 01/09/2016, 3:43 PM

## 2016-01-22 ENCOUNTER — Emergency Department (HOSPITAL_COMMUNITY)
Admission: EM | Admit: 2016-01-22 | Discharge: 2016-01-22 | Disposition: A | Discharge status: Home / Self Care | Payer: Medicare Other | Attending: Emergency Medicine | Admitting: Emergency Medicine

## 2016-01-22 ENCOUNTER — Encounter (HOSPITAL_COMMUNITY): Payer: Self-pay | Admitting: Emergency Medicine

## 2016-01-22 DIAGNOSIS — Z79899 Other long term (current) drug therapy: Secondary | ICD-10-CM | POA: Diagnosis not present

## 2016-01-22 DIAGNOSIS — M5481 Occipital neuralgia: Secondary | ICD-10-CM | POA: Diagnosis not present

## 2016-01-22 DIAGNOSIS — I10 Essential (primary) hypertension: Secondary | ICD-10-CM | POA: Diagnosis not present

## 2016-01-22 DIAGNOSIS — Z7982 Long term (current) use of aspirin: Secondary | ICD-10-CM | POA: Diagnosis not present

## 2016-01-22 DIAGNOSIS — F17210 Nicotine dependence, cigarettes, uncomplicated: Secondary | ICD-10-CM | POA: Diagnosis not present

## 2016-01-22 DIAGNOSIS — R200 Anesthesia of skin: Secondary | ICD-10-CM | POA: Diagnosis present

## 2016-01-22 DIAGNOSIS — J449 Chronic obstructive pulmonary disease, unspecified: Secondary | ICD-10-CM | POA: Insufficient documentation

## 2016-01-22 NOTE — Discharge Instructions (Signed)
Please follow-up with your primary care doctor and neurologist.

## 2016-01-22 NOTE — ED Provider Notes (Signed)
[facility] Provider Note   CSN: [id number] Arrival date & time: 01/22/16  1850  By signing my name below, I, [name], attest that this documentation has been prepared under the direction and in the presence of physician practitioner, Lavera Guise, MD. Electronically Signed: Linna [name], Scribe. 01/22/2016. 8:33 PM.  History   Chief Complaint Chief Complaint  Patient presents with  . Numbness    The history is provided by the patient. No language interpreter was used.     HPI Comments: [name] is a 81 y.o. female who presents to the Emergency Department complaining of sudden onset, constant, left-sided scalp tingling and numbness for the last three days. Pt reports she was seen here for the same reason on 12/24/15; she states this episode of numbness lasted for about a week but resolved on its own. Tingling localized to left side behind the ear. No modifying factors noted. She notes she has arthritis in her neck. No significant neck pain.  Pt denies recent neck manipulation, falls, or neck injury. Pt has not been seen by her PCP for this issue. She denies numbness/weakness of her extremities, facial numbness, vision changes, speech changes, fever, abdominal pain, or any other associated symptoms. Daughter reports pt has a neurologist (Dr. Threasa Beards) in [place].  Past Medical History:  Diagnosis Date  . Allergic rhinitis   . Anxiety   . Arthritis   . Cataract    had eye surgery  . Depression   . Essential hypertension, benign   . GERD (gastroesophageal reflux disease)   . History of blood transfusion   . Left bundle branch block   . Memory loss   . Mixed hyperlipidemia   . Osteopenia   . Secondary cardiomyopathy (HCC)    LVEF 40-45%, likely nonischemic    Patient Active Problem List   Diagnosis Date Noted  . Syncope 11/15/2015  . Nausea and vomiting 11/15/2015  . Dehydration 10/05/2015  . UTI (lower urinary tract infection) 10/05/2015  . Dementia  09/13/2015  . Memory problem 05/07/2015  . COPD (chronic obstructive pulmonary disease) (HCC) 04/19/2015  . Neck pain 04/06/2015  . Insomnia 12/10/2014  . BMI 28.0-28.9,adult 12/10/2014  . Osteopenia 05/19/2014  . GAD (generalized anxiety disorder) 01/06/2014  . Depression 01/06/2014  . GERD (gastroesophageal reflux disease) 01/06/2014  . Vitamin D deficiency 01/06/2014  . Overactive bladder 01/06/2014  . Secondary cardiomyopathy (HCC) 10/06/2013  . Left bundle branch block 10/06/2013  . Dizziness 10/04/2013  . Osteoarthritis of right hip 03/14/2013  . Allergic rhinitis 12/07/2008  . Chronic cough 12/07/2008  . Hepatitis B virus infection 08/16/2008  . Hyperlipidemia 08/16/2008  . Essential hypertension 08/16/2008  . Osteoarthritis 08/16/2008  . DISC DEGENERATION 07/24/2007    Past Surgical History:  Procedure Laterality Date  . APPENDECTOMY    . BACK SURGERY    . CARDIAC CATHETERIZATION  2010   Normal coronaries  . CATARACT EXTRACTION W/PHACO  04/16/2011   Procedure: CATARACT EXTRACTION PHACO AND INTRAOCULAR LENS PLACEMENT (IOC);  Surgeon: Susa Simmonds;  Location: [facility];  Service: Ophthalmology;  Laterality: Left;  CDE=11.35  . CATARACT EXTRACTION W/PHACO  01/28/2012   Procedure: CATARACT EXTRACTION PHACO AND INTRAOCULAR LENS PLACEMENT (IOC);  Surgeon: Susa Simmonds, MD;  Location: [facility];  Service: Ophthalmology;  Laterality: Right;  CDI:8.15  . CYSTO WITH HYDRODISTENSION  05/29/2012   Procedure: CYSTOSCOPY/HYDRODISTENSION;  Surgeon: Martina Sinner, MD;  Location: [facility];  Service: Urology;  Laterality: N/A;  INSTILLATION OF  MARCAINE AND PYRIDIUM   . DILATION AND CURETTAGE OF UTERUS  ?<hysterectomy"  . HAMMER TOE SURGERY Bilateral 11/22/11   [facility], [name]  . JOINT REPLACEMENT    . LAPAROSCOPIC CHOLECYSTECTOMY  1991  . LUMBAR LAMINECTOMY  2011  . LUMBAR LAMINECTOMY/DECOMPRESSION MICRODISCECTOMY Left 06/06/2012   Procedure: LUMBAR  LAMINECTOMY/DECOMPRESSION MICRODISCECTOMY 1 LEVEL;  Surgeon: Maeola Harman, MD;  Location: [facility];  Service: Neurosurgery;  Laterality: Left;  Left Lumbar two-three Laminectomy for resection of synovial cyst  . SHOULDER OPEN ROTATOR CUFF REPAIR Right 2011  . TONSILLECTOMY    . TOTAL ABDOMINAL HYSTERECTOMY    . TOTAL HIP ARTHROPLASTY Right 03/13/2013   Procedure: TOTAL HIP ARTHROPLASTY;  Surgeon: Nestor Lewandowsky, MD;  Location: [facility];  Service: Orthopedics;  Laterality: Right;    OB History    Gravida Para Term Preterm AB Living   5 5 5     4    SAB TAB Ectopic Multiple Live Births                   Home Medications    Prior to Admission medications   Medication Sig Start Date End Date Taking? Authorizing Provider  albuterol (PROVENTIL HFA;VENTOLIN HFA) 108 (90 Base) MCG/ACT inhaler Inhale 2 puffs into the lungs every 6 (six) hours as needed for wheezing or shortness of breath. 01/09/16   03/10/16 Dettinger, MD  ALPRAZolam Elige Radon) 0.5 MG tablet Take 1 tablet (0.5 mg total) by mouth 2 (two) times daily as needed for anxiety. 10/18/15   10/20/15, FNP  aspirin EC 81 MG tablet Take 81 mg by mouth daily.    Historical Provider, MD  azithromycin (ZITHROMAX) 250 MG tablet Take 2 the first day and then one each day after. 01/09/16   03/10/16 Dettinger, MD  buPROPion (WELLBUTRIN SR) 150 MG 12 hr tablet Take 1 tablet (150 mg total) by mouth 2 (two) times daily. Patient taking differently: Take 150 mg by mouth daily.  10/13/15   10/15/15, FNP  carvedilol (COREG) 6.25 MG tablet Take 1 tablet (6.25 mg total) by mouth 2 (two) times daily with a meal. 10/13/15   10/15/15, FNP  cetirizine (ZYRTEC) 10 MG tablet Take 0.5 tablets (5 mg total) by mouth at bedtime. 11/08/15   01/09/16, FNP  cyclobenzaprine (FLEXERIL) 5 MG tablet Take 0.5-1 tablets (2.5-5 mg total) by mouth 3 (three) times daily as needed for muscle spasms. Patient not taking: Reported on 01/09/2016 12/08/15   02/07/16, MD  esomeprazole (NEXIUM) 40 MG capsule Take 1 capsule (40 mg total) by mouth daily before breakfast. Patient not taking: Reported on 01/09/2016 10/13/15   10/15/15, FNP  fluticasone (FLONASE) 50 MCG/ACT nasal spray Place 2 sprays into both nostrils daily. 11/08/15   01/09/16, FNP  fluticasone furoate-vilanterol (BREO ELLIPTA) 200-25 MCG/INH AEPB Inhale 1 puff into the lungs daily. 01/09/16   03/10/16 Dettinger, MD  ibuprofen (ADVIL,MOTRIN) 600 MG tablet Take 1 tablet (600 mg total) by mouth every 8 (eight) hours as needed. 12/08/15   02/07/16, MD  losartan (COZAAR) 50 MG tablet Take 0.5 tablets (25 mg total) by mouth daily. 10/13/15   10/15/15, FNP  Multiple Vitamins-Minerals (CENTRUM SILVER PO) Take 1 tablet by mouth daily.    Historical Provider, MD  ondansetron (ZOFRAN) 4 MG tablet Take 1 tablet (4 mg total) by mouth every 8 (eight) hours as needed for nausea or vomiting.  01/09/16   Elige Radon Dettinger, MD  predniSONE (DELTASONE) 20 MG tablet 2 po at same time daily for 5 days 01/09/16   Elige Radon Dettinger, MD  raloxifene (EVISTA) 60 MG tablet Take 1 tablet (60 mg total) by mouth daily. 10/13/15   Junie Spencer, FNP  vitamin B-12 (CYANOCOBALAMIN) 1000 MCG tablet Take 1,000 mcg by mouth daily.    Historical Provider, MD    Family History Family History  Problem Relation Age of Onset  . Diabetes Father   . Diabetes Brother   . Cancer Brother 106    colon  . Diabetes Brother   . Diabetes Brother   . Hypertension Sister   . Diabetes Sister   . Diabetes Sister   . Diabetes Other   . Allergies Other   . Colon cancer Neg Hx   . Anesthesia problems Neg Hx   . Hypotension Neg Hx   . Malignant hyperthermia Neg Hx   . Pseudochol deficiency Neg Hx     Social History Social History  Substance Use Topics  . Smoking status: Current Some Day Smoker    Packs/day: 0.10    Years: 60.00    Types: Cigarettes  . Smokeless tobacco: Never Used  . Alcohol use No      Allergies   Lisinopril-hydrochlorothiazide; Codeine; and Penicillins   Review of Systems Review of Systems  Constitutional: Negative for fever.  Eyes: Negative for visual disturbance.  Gastrointestinal: Negative for abdominal pain.  Musculoskeletal: Positive for neck pain (at baseline).  Neurological: Positive for numbness (left side of scalp). Negative for speech difficulty and weakness.       Positive for sensation loss.  All other systems reviewed and are negative.   Physical Exam Updated Vital Signs BP 133/70   Pulse 70   Temp 97.9 F (36.6 C) (Oral)   Resp 20   Ht 5\' 4"  (1.626 m)   Wt 156 lb (70.8 kg)   SpO2 99%   BMI 26.78 kg/m   Physical Exam  Physical Exam  Nursing note and vitals reviewed. Constitutional: Well developed, well nourished, non-toxic, and in no acute distress Head: Normocephalic and atraumatic.  Mouth/Throat: Oropharynx is clear and moist.  Neck: Normal range of motion. Neck supple.  Cardiovascular: Normal rate and regular rhythm.   Pulmonary/Chest: Effort normal and breath sounds normal.  Abdominal: Soft. There is no tenderness. There is no rebound and no guarding.  Musculoskeletal: Normal range of motion.  Neurological: Alert, no facial droop, fluent speech, moves all extremities symmetrically. No pronator drift. Sensation to light touch intact throughout. No dysmetria with finger-to-nose. Paresthesias along the left side of the scalp behind the ear. Skin: Skin is warm and dry.  Psychiatric: Cooperative  ED Treatments / Results  Labs (all labs ordered are listed, but only abnormal results are displayed) Labs Reviewed - No data to display  EKG  EKG Interpretation  Date/Time:  Sunday January 22 2016 19:06:11 EDT Ventricular Rate:  82 PR Interval:    QRS Duration: 134 QT Interval:  402 QTC Calculation: 470 R Axis:   34 Text Interpretation:  Sinus rhythm Probable left atrial enlargement Left bundle branch block no acute changes  Confirmed by Johny Pitstick MD, Camyra Vaeth ([id number]) on 01/22/2016 7:50:59 PM       Radiology No results found.  Procedures Procedures (including critical care time)  DIAGNOSTIC STUDIES: Oxygen Saturation is 99% on RA, normal by my interpretation.    COORDINATION OF CARE: 8:48 PM Discussed treatment plan with pt at  bedside and pt agreed to plan.  Medications Ordered in ED Medications - No data to display   Initial Impression / Assessment and Plan / ED Course  I have reviewed the triage vital signs and the nursing notes.  Pertinent labs & imaging results that were available during my care of the patient were reviewed by me and considered in my medical decision making (see chart for details).  Clinical Course    Recent diagnosis of occipital neuralgia, who returns with recurrence of symptoms.   CT cervical spine from 10/2015 reviewed, showing left greater than right multilevel arthrosis which could contribute. No neck injury or manipulation, and no neck pain. Neuro exam otherwise in tact and non-focal.Patient does have neurologist who she is planning on follow-up with. Strict return and follow-up instructions reviewed. She  expressed understanding of all discharge instructions and felt comfortable with the plan of care.    I personally performed the services described in this documentation, which was scribed in my presence. The recorded information has been reviewed and is accurate.   Final Clinical Impressions(s) / ED Diagnoses   Final diagnoses:  Occipital neuralgia of left side    New Prescriptions Discharge Medication List as of 01/22/2016  9:23 PM       Lavera Guise, MD 01/23/16 1553

## 2016-01-22 NOTE — ED Triage Notes (Signed)
Pt reports L sided head numbness that started Thursday. Pt denies any other numbness. Pt seen here for same on 8/26.

## 2016-01-23 ENCOUNTER — Ambulatory Visit (INDEPENDENT_AMBULATORY_CARE_PROVIDER_SITE_OTHER): Payer: Medicare Other

## 2016-01-23 DIAGNOSIS — Z23 Encounter for immunization: Secondary | ICD-10-CM | POA: Diagnosis not present

## 2016-01-26 ENCOUNTER — Encounter: Payer: Self-pay | Admitting: Family Medicine

## 2016-01-26 ENCOUNTER — Ambulatory Visit (INDEPENDENT_AMBULATORY_CARE_PROVIDER_SITE_OTHER): Payer: Medicare Other | Admitting: Family Medicine

## 2016-01-26 ENCOUNTER — Ambulatory Visit: Payer: Medicare Other | Admitting: Family Medicine

## 2016-01-26 ENCOUNTER — Ambulatory Visit (HOSPITAL_COMMUNITY)
Admission: RE | Admit: 2016-01-26 | Discharge: 2016-01-26 | Disposition: A | Discharge status: Home / Self Care | Payer: Medicare Other | Source: Ambulatory Visit | Attending: Family Medicine | Admitting: Family Medicine

## 2016-01-26 VITALS — BP 150/83 | HR 75 | Temp 97.3°F

## 2016-01-26 DIAGNOSIS — M5481 Occipital neuralgia: Secondary | ICD-10-CM | POA: Insufficient documentation

## 2016-01-26 DIAGNOSIS — R202 Paresthesia of skin: Secondary | ICD-10-CM | POA: Insufficient documentation

## 2016-01-26 DIAGNOSIS — M47892 Other spondylosis, cervical region: Secondary | ICD-10-CM | POA: Diagnosis not present

## 2016-01-26 DIAGNOSIS — R200 Anesthesia of skin: Secondary | ICD-10-CM | POA: Diagnosis not present

## 2016-01-26 DIAGNOSIS — G319 Degenerative disease of nervous system, unspecified: Secondary | ICD-10-CM | POA: Diagnosis not present

## 2016-01-26 NOTE — Progress Notes (Signed)
+   dizzy

## 2016-01-26 NOTE — Progress Notes (Signed)
   HPI  Patient presents today here with worsening occipital neuralgia and left upper and lower extremity. Seizures.  Patient explains that over the last 24 hours she's had worsening left gout numbness and tingling, she's also developed decreased sensation of the left upper and lower extremity.  She's also having severe dizziness and nausea.  She states the dizziness is worse with standing up, she has an uneasy sensation but denies any room spinning sensation.  She denies fever, chills, sweats.  PMH: Smoking status noted ROS: Per HPI  Objective: BP 136/78 (BP Location: Right Arm, Patient Position: Sitting, Cuff Size: Normal)   Pulse 70   Temp 97.3 F (36.3 C) (Oral)  Gen: NAD, alert, cooperative with exam HEENT: NCAT, EOMI, PERRL CV: RRR, good S1/S2, no murmur Resp: CTABL, no wheezes, non-labored Ext: No edema, warm Neuro: Alert and oriented,   strength 4/5 in bilateral lower extremities, 3/5 in bilateral grip, sensation is altered on the left upper and lower extremity compared to the right, also it is decreased on her left scalp compared to the right scalp. Cranial nerves II through XII intact Assessment and plan:  # Paresthesia of the left upper limb and left lower limb, occipital neuralgia Given progression and new neurologic symptoms in the last 24 hours I'm sending her for a stat MRI to rule out acute CVA. Her neurologic exam is reassuring except for her sensation deficits. If her MRI is normal we will arrange for neurologic follow-up with her neurologist as soon as possible.     Orders Placed This Encounter  Procedures  . MR Brain Wo Contrast    Standing Status:   Future    Standing Expiration Date:   03/27/2017    Order Specific Question:   Reason for Exam (SYMPTOM  OR DIAGNOSIS REQUIRED)    Answer:   Paresthesia and decreased sensation of the left side, left-sided occipital neuralgia, rule out CVA    Order Specific Question:   Preferred imaging location?   Answer:   [facility] (table limit-350lbs)    Order Specific Question:   What is the patient's sedation requirement?    Answer:   No Sedation    Order Specific Question:   Does the patient have a pacemaker or implanted devices?    Answer:   No    No orders of the defined types were placed in this encounter.   Murtis Sink, MD [facility] 01/26/2016, 4:33 PM

## 2016-01-26 NOTE — Patient Instructions (Signed)
Great to meet you!  Please go to the  [name] to get an MRI to rule out stroke.

## 2016-01-27 ENCOUNTER — Other Ambulatory Visit: Payer: Self-pay | Admitting: *Deleted

## 2016-01-27 DIAGNOSIS — R55 Syncope and collapse: Secondary | ICD-10-CM

## 2016-01-27 DIAGNOSIS — R413 Other amnesia: Secondary | ICD-10-CM

## 2016-01-27 DIAGNOSIS — R42 Dizziness and giddiness: Secondary | ICD-10-CM

## 2016-01-27 DIAGNOSIS — M542 Cervicalgia: Secondary | ICD-10-CM

## 2016-02-06 ENCOUNTER — Other Ambulatory Visit: Payer: Self-pay | Admitting: Family Medicine

## 2016-02-06 ENCOUNTER — Encounter: Payer: Self-pay | Admitting: Pediatrics

## 2016-02-06 ENCOUNTER — Ambulatory Visit (INDEPENDENT_AMBULATORY_CARE_PROVIDER_SITE_OTHER): Payer: Medicare Other | Admitting: Pediatrics

## 2016-02-06 VITALS — BP 139/79 | HR 75 | Temp 97.9°F | Ht 64.0 in | Wt 158.0 lb

## 2016-02-06 DIAGNOSIS — J449 Chronic obstructive pulmonary disease, unspecified: Secondary | ICD-10-CM

## 2016-02-06 DIAGNOSIS — M542 Cervicalgia: Secondary | ICD-10-CM

## 2016-02-06 DIAGNOSIS — R269 Unspecified abnormalities of gait and mobility: Secondary | ICD-10-CM | POA: Diagnosis not present

## 2016-02-06 NOTE — Progress Notes (Signed)
  Subjective:   Patient ID: [name], female    DOB: [birth year], 81 y.o.   MRN: [id number] CC: Dizziness  HPI: [name] is a 81 y.o. female presenting for Dizziness  Feels "wobbly headed" at times On her L side she has a numbness and tingling on the L side of head Has been seen twice for occipital neuralgia in ED Had normal MRI two weeks ago Still cant turn head L past midline due to L sided neck pain  Has numbness L side of her head all the time Sometimes tingling Has been doing gentle neck exercises and turning her head but doesn't think it have been helping  Continues to smoke occasionally Breathing has been fine recently Treated for COPD exacerbation 1 month ago  Relevant past medical, surgical, family and social history reviewed. Allergies and medications reviewed and updated. History  Smoking Status  . Current Some Day Smoker  . Packs/day: 0.10  . Years: 60.00  . Types: Cigarettes  Smokeless Tobacco  . Never Used   ROS: Per HPI   Objective:    BP 139/79   Pulse 75   Temp 97.9 F (36.6 C) (Oral)   Ht 5\' 4"  (1.626 m)   Wt 158 lb (71.7 kg)   BMI 27.12 kg/m   Wt Readings from Last 3 Encounters:  02/06/16 158 lb (71.7 kg)  01/22/16 156 lb (70.8 kg)  01/09/16 157 lb 6.4 oz (71.4 kg)    Gen: NAD, alert, cooperative with exam, NCAT EYES: EOMI, no conjunctival injection, or no icterus ENT:  TMs pearly gray b/l, OP without erythema LYMPH: no cervical LAD CV: NRRR, normal S1/S2, no murmur, distal pulses 2+ b/l Resp: CTABL, no wheezes, normal WOB Neuro: Alert and oriented, strength equal b/l UE and LE, coordination grossly normal MSK: muscle spasm L upper shoulder/side of neck. Able to turn head minimally past midline to the L, good ROM with R-ward turn  Assessment & Plan:  [name] was seen today for dizziness, balance problems.  Diagnoses and all orders for this visit:  Abnormality of gait Orthostatics normal Two falls last few months,  mechanical Will refer to PT for gait eval, assist device training Does use cane at times at home -     Ambulatory referral to Physical Therapy  Neck pain -     Ambulatory referral to Physical Therapy  COPD Symptoms well controlled today Cont meds  Follow up plan: 2 mo [name], MD [facility]

## 2016-02-16 ENCOUNTER — Encounter: Payer: Self-pay | Admitting: Pediatrics

## 2016-02-16 ENCOUNTER — Ambulatory Visit (INDEPENDENT_AMBULATORY_CARE_PROVIDER_SITE_OTHER): Payer: Medicare Other | Admitting: Pediatrics

## 2016-02-16 VITALS — BP 139/78 | HR 76 | Temp 97.2°F | Ht 64.0 in | Wt 158.8 lb

## 2016-02-16 DIAGNOSIS — M19019 Primary osteoarthritis, unspecified shoulder: Secondary | ICD-10-CM | POA: Diagnosis not present

## 2016-02-16 DIAGNOSIS — L309 Dermatitis, unspecified: Secondary | ICD-10-CM

## 2016-02-16 MED ORDER — TRIAMCINOLONE ACETONIDE 0.025 % EX OINT: 1.0000 "application " | TOPICAL_OINTMENT | Freq: Two times a day (BID) | CUTANEOUS | 0 refills | Status: DC

## 2016-02-16 NOTE — Progress Notes (Signed)
  Subjective:   Patient ID: [name], female    DOB: [birth year], 81 y.o.   MRN: [id number] CC: Arm Pain  HPI: [name] is a 81 y.o. female presenting for Arm Pain  H/o arthritis in R shoulder Had rotator cuff surgery some years ago Has been bothering her more the past few months, worse the past 2 weeks Doesn't think she has done anything differently No repetitive movements with the arm No extra work Pt is R handed Notices it aching after up moving around Tried lidocaine patches, topical NSAIDs, minimal relief  Stung by insect, has had a lot of rednes,s itching in area since then Tried OTC meds, minimal relief  Relevant past medical, surgical, family and social history reviewed. Allergies and medications reviewed and updated. History  Smoking Status  . Current Some Day Smoker  . Packs/day: 0.10  . Years: 60.00  . Types: Cigarettes  Smokeless Tobacco  . Never Used   ROS: Per HPI   Objective:    BP 139/78   Pulse 76   Temp 97.2 F (36.2 C) (Oral)   Ht 5\' 4"  (1.626 m)   Wt 158 lb 12.8 oz (72 kg)   BMI 27.26 kg/m   Wt Readings from Last 3 Encounters:  02/16/16 158 lb 12.8 oz (72 kg)  02/06/16 158 lb (71.7 kg)  01/22/16 156 lb (70.8 kg)    Gen: NAD, alert, cooperative with exam, NCAT EYES: EOMI, no conjunctival injection, or no icterus Neuro: Alert and oriented MSK: TTP over [facility] joint, can raise arm to apprx 120 degrees, pain at most extension at shoulder, no redness, decreased internal rotation compared with L arm, pain anterior shoulder with flex at elbow against resistance, rotator cuff muscles intact Improved ROM of neck, can turn to L and R better, chin to chest  Assessment & Plan:  [name] was seen today for arm pain.  Diagnoses and all orders for this visit:  Shoulder arthritis Discussed options, pt wants to have steroid injection Cont gentle ROM of neck, shoulder   Dermatitis -     triamcinolone (KENALOG) 0.025 % ointment; Apply 1  application topically 2 (two) times daily.  PROCEDURE: Intrarticular Shoulder Injection Verbal consent was obtained from the patient. Risks including infection explained and contrasted with benefits and alternatives. Patient prepped with betadine. An intraarticular shoulder injection was performed using the posterior approach. The patient tolerated the procedure well. No complications. Injection: 3 cc of Lidocaine 1% and Kenalog 40 mg. Needle: 25 gauge   Follow up plan: As scheduled [name], MD Rex Kras [facility]

## 2016-02-21 ENCOUNTER — Ambulatory Visit (INDEPENDENT_AMBULATORY_CARE_PROVIDER_SITE_OTHER): Payer: Medicare Other | Admitting: Neurology

## 2016-02-21 ENCOUNTER — Encounter: Payer: Self-pay | Admitting: Neurology

## 2016-02-21 VITALS — BP 141/78 | HR 66 | Ht 64.0 in | Wt 155.2 lb

## 2016-02-21 DIAGNOSIS — R413 Other amnesia: Secondary | ICD-10-CM

## 2016-02-21 NOTE — Progress Notes (Signed)
PATIENT: [name] DOB: [birth year]  Chief Complaint  Patient presents with  . Dementia    MMSE 26/30 - 8 animals.  She is here with her daughter, [name].  Reports stopping both Aricept and Namenda due to side effects of weakness, nausea and dizziness.     HISTORICAL  [name] is a 81 years old right-handed female, accompanied by her daughter [name], seen in refer by her primary care physician Dr.  [name] for evaluation of memory loss, abnormal MRI scan in Sep 13 2015  She had a history of hypertension, hyperlipidemia, depression, lumbar decompression surgery twice, history of right hip replacement.  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   She noticed memory loass since 2015, gradually getting worse, she got lost while driving on the familiar route, she could not find her way home after she dropped her son to work one morning, she has to call her daughter to find her, she began to make mistakes with her bill payment, her daughter now take over, she has lost interest in cooking, Her mother has Alzheimer's disease   have personally reviewed MRI brian in April 2017 significant atrophy Laboratory evaluation normal CBC, CMP showed elevated creatinine 1.09, normal B12 folic acid.  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 Marked bilateral parietal lobe atrophy.  Mild chronic  microvascular changes. 1.8 cm cyst superior to the velum interpositum unchanged and most likely an incidental finding.  Stable marked left C1-2 lateral mass degenerative changes.  REVIEW OF SYSTEMS: Full 14 system review of systems performed and notable only for dizziness, numbness  ALLERGIES: Allergies  Allergen Reactions  . Lisinopril-Hydrochlorothiazide Other (See Comments)    Lip swelling and cough. Stopped by ENT. Patient is on Losartan without any issues   . Codeine Itching and Swelling    eye irritation (prescribed percocet)  . Penicillins Itching and Swelling    Has patient had a PCN reaction causing immediate rash, facial/tongue/throat swelling, SOB or lightheadedness with hypotension: Yes Has patient had a PCN reaction causing severe rash involving mucus membranes or skin necrosis: No Has patient had a PCN reaction that required hospitalization No Has patient had a PCN reaction occurring within the last 10 years: No If all of the above answers are "NO", then may proceed with Cephalosporin use. eye irritation    HOME MEDICATIONS: Current Outpatient Prescriptions  Medication Sig Dispense Refill  . albuterol (PROVENTIL HFA;VENTOLIN HFA) 108 (90 Base) MCG/ACT inhaler Inhale 2 puffs into the lungs every 6 (six) hours as needed for wheezing or shortness of breath. 1 Inhaler 0  . ALPRAZolam (XANAX) 0.5 MG tablet Take 1 tablet (0.5 mg total) by mouth 2 (two) times daily as needed for anxiety. 30 tablet 2  . aspirin EC 81 MG tablet Take 81 mg by mouth daily.    Marland Kitchen buPROPion (WELLBUTRIN SR) 150 MG 12 hr  tablet Take 1 tablet (150 mg total) by mouth 2 (two) times daily. (Patient taking differently: Take 150 mg by mouth daily. ) 180 tablet 1  . carvedilol (COREG) 6.25 MG tablet Take 1 tablet (6.25 mg total) by mouth 2 (two) times daily with a meal. 60 tablet 6  . cetirizine (ZYRTEC) 10 MG tablet Take 0.5 tablets (5 mg total) by mouth at bedtime. 90 tablet 3  . cyclobenzaprine (FLEXERIL) 5  MG tablet Take 0.5-1 tablets (2.5-5 mg total) by mouth 3 (three) times daily as needed for muscle spasms. 10 tablet 0  . esomeprazole (NEXIUM) 40 MG capsule Take 1 capsule (40 mg total) by mouth daily before breakfast. 90 capsule 1  . fluticasone (FLONASE) 50 MCG/ACT nasal spray Place 2 sprays into both nostrils daily. 16 g 2  . fluticasone furoate-vilanterol (BREO ELLIPTA) 200-25 MCG/INH AEPB Inhale 1 puff into the lungs daily. 30 each 1  . ibuprofen (ADVIL,MOTRIN) 600 MG tablet Take 1 tablet (600 mg total) by mouth every 8 (eight) hours as needed. 30 tablet 0  . losartan (COZAAR) 50 MG tablet Take 0.5 tablets (25 mg total) by mouth daily. 90 tablet 1  . Multiple Vitamins-Minerals (CENTRUM SILVER PO) Take 1 tablet by mouth daily.    . ondansetron (ZOFRAN) 4 MG tablet Take 1 tablet every 8 hours as needed for nausea or vomiting. 20 tablet 0  . raloxifene (EVISTA) 60 MG tablet Take 1 tablet (60 mg total) by mouth daily. 90 tablet 1  . triamcinolone (KENALOG) 0.025 % ointment Apply 1 application topically 2 (two) times daily. 30 g 0  . vitamin B-12 (CYANOCOBALAMIN) 1000 MCG tablet Take 1,000 mcg by mouth daily.     No current facility-administered medications for this visit.     PAST MEDICAL HISTORY: Past Medical History:  Diagnosis Date  . Allergic rhinitis   . Anxiety   . Arthritis   . Cataract    had eye surgery  . Depression   . Essential hypertension, benign   . GERD (gastroesophageal reflux disease)   . History of blood transfusion   . Left bundle branch block   . Memory loss   . Mixed hyperlipidemia   . Osteopenia   . Secondary cardiomyopathy (HCC)    LVEF 40-45%, likely nonischemic    PAST SURGICAL HISTORY: Past Surgical History:  Procedure Laterality Date  . APPENDECTOMY    . BACK SURGERY    . CARDIAC CATHETERIZATION  2010   Normal coronaries  . CATARACT EXTRACTION W/PHACO  04/16/2011   Procedure: CATARACT EXTRACTION PHACO AND INTRAOCULAR LENS PLACEMENT (IOC);   Surgeon: Susa Simmonds;  Location: [facility];  Service: Ophthalmology;  Laterality: Left;  CDE=11.35  . CATARACT EXTRACTION W/PHACO  01/28/2012   Procedure: CATARACT EXTRACTION PHACO AND INTRAOCULAR LENS PLACEMENT (IOC);  Surgeon: Susa Simmonds, MD;  Location: [facility];  Service: Ophthalmology;  Laterality: Right;  CDI:8.15  . CYSTO WITH HYDRODISTENSION  05/29/2012   Procedure: CYSTOSCOPY/HYDRODISTENSION;  Surgeon: Martina Sinner, MD;  Location: [facility];  Service: Urology;  Laterality: N/A;  INSTILLATION OF MARCAINE AND PYRIDIUM   . DILATION AND CURETTAGE OF UTERUS  ?<hysterectomy"  . HAMMER TOE SURGERY Bilateral 11/22/11   [facility], [name]  . JOINT REPLACEMENT    . LAPAROSCOPIC CHOLECYSTECTOMY  1991  . LUMBAR LAMINECTOMY  2011  . LUMBAR LAMINECTOMY/DECOMPRESSION MICRODISCECTOMY Left 06/06/2012   Procedure: LUMBAR LAMINECTOMY/DECOMPRESSION MICRODISCECTOMY 1 LEVEL;  Surgeon: Maeola Harman, MD;  Location: [facility];  Service: Neurosurgery;  Laterality: Left;  Left Lumbar two-three Laminectomy for resection of synovial cyst  . SHOULDER OPEN ROTATOR CUFF REPAIR Right 2011  . TONSILLECTOMY    . TOTAL ABDOMINAL HYSTERECTOMY    . TOTAL HIP ARTHROPLASTY Right 03/13/2013   Procedure: TOTAL HIP ARTHROPLASTY;  Surgeon: Nestor Lewandowsky, MD;  Location: [facility];  Service: Orthopedics;  Laterality: Right;    FAMILY HISTORY: Family History  Problem Relation Age of Onset  . Diabetes Father   . Diabetes Brother   . Cancer Brother 68    colon  . Diabetes Brother   . Diabetes Brother   . Hypertension Sister   . Diabetes Sister   . Diabetes Sister   . Diabetes Other   . Allergies Other   . Colon cancer Neg Hx   . Anesthesia problems Neg Hx   . Hypotension Neg Hx   . Malignant hyperthermia Neg Hx   . Pseudochol deficiency Neg Hx     SOCIAL HISTORY:  Social History   Social History  . Marital status: Widowed    Spouse name: N/A  . Number of children: 5  . Years of education: 8th  grade   Occupational History  . 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 Part time care giver    Social History Main Topics  . Smoking status: Current Some Day Smoker    Packs/day: 0.10    Years: 60.00    Types: Cigarettes  . Smokeless tobacco: Never Used  . Alcohol use No  . Drug use: No  . Sexual activity: No   Other Topics Concern  . Not on file   Social History Narrative   Divorced, lives alone.   Right-handed.   1 cup coffee per day.     PHYSICAL EXAM   Vitals:   02/21/16 1503  BP: (!) 141/78  Pulse: 66  Weight: 155 lb 4 oz (70.4 kg)  Height: 5\' 4"  (1.626 m)    Not recorded      Body mass index is 26.65 kg/m.  PHYSICAL EXAMNIATION:  Gen: NAD, conversant, well nourised, obese, well groomed                     Cardiovascular: Regular rate rhythm, no peripheral edema, warm, nontender. Eyes: Conjunctivae clear without exudates or hemorrhage Neck: Supple, no carotid bruise. Pulmonary: Clear to auscultation bilaterally   NEUROLOGICAL EXAM:  MENTAL STATUS: Speech:    Speech is normal; fluent and spontaneous with normal comprehension.  Cognition:Mini-Mental Status Examination 26 /30, animal naming 8     Orientation: She is oriented to time date and place     Recent and remote memory: She missed 3/3 recall     Attention span and concentration: She has difficulty spell world backwards     Normal Language, naming, repeating,spontaneous speech     Fund of knowledge   CRANIAL NERVES: CN II: Visual fields are full to confrontation. Fundoscopic exam is normal with sharp discs and no vascular changes. Pupils are round equal and briskly reactive to light. CN III, IV, VI: extraocular movement are normal. No ptosis. CN V: Facial sensation is intact to pinprick in all 3 divisions bilaterally. Corneal responses are intact.  CN VII: Face is symmetric with normal eye closure and smile. CN VIII: Hearing is normal to rubbing fingers CN IX,  X: Palate elevates symmetrically. Phonation is normal. CN XI: Head turning and shoulder shrug are intact CN XII: Tongue is  midline with normal movements and no atrophy.  MOTOR: There is no pronator drift of out-stretched arms. Muscle bulk and tone are normal. Muscle strength is normal.  REFLEXES: Reflexes are 2+ and symmetric at the biceps, triceps, knees, and ankles. Plantar responses are flexor.  SENSORY: Intact to light touch, pinprick, positional sensation and vibratory sensation are intact in fingers and toes.  COORDINATION: Rapid alternating movements and fine finger movements are intact. There is no dysmetria on finger-to-nose and heel-knee-shin.    GAIT/STANCE: Posture is normal. Gait is steady with normal steps, base, arm swing, and turning. Heel and toe walking are normal. Tandem gait is normal.  Romberg is absent.   DIAGNOSTIC DATA (LABS, IMAGING, TESTING) - I reviewed patient records, labs, notes, testing and imaging myself where available.   ASSESSMENT AND PLAN  LILYMAE SWIECH is a 81 y.o. female   Mild dementia without behavioral issue  Strong family history of Alzheimer's dementia  Significant atrophy on brain MRIs,  Most likely central nervous system disorder likely early Alzheimer's dementia  She could not tolerate Namenda 10 mg twice a day, Aricept 10 mg once a day due to GI side effect  Laboratory evaluation showed no treatable etiology  She is interested in dementia trial,   Levert Feinstein, M.D. Ph.D.  [facility] [address], [address] [place], [place] [address] Ph: [phone] Fax: [phone]  CC: [name], MD

## 2016-03-12 ENCOUNTER — Ambulatory Visit: Payer: Medicare Other | Admitting: Pediatrics

## 2016-03-12 ENCOUNTER — Other Ambulatory Visit: Payer: Self-pay | Admitting: *Deleted

## 2016-03-12 MED ORDER — MOMETASONE FUROATE 50 MCG/ACT NA SUSP: 2.0000 | Freq: Every day | NASAL | 12 refills | Status: DC

## 2016-03-15 ENCOUNTER — Encounter: Payer: Self-pay | Admitting: Pediatrics

## 2016-03-19 ENCOUNTER — Ambulatory Visit (INDEPENDENT_AMBULATORY_CARE_PROVIDER_SITE_OTHER): Payer: Medicare Other | Admitting: Pediatrics

## 2016-03-19 ENCOUNTER — Encounter: Payer: Self-pay | Admitting: Pediatrics

## 2016-03-19 VITALS — BP 132/71 | HR 83 | Temp 97.1°F | Ht 64.0 in | Wt 159.0 lb

## 2016-03-19 DIAGNOSIS — J449 Chronic obstructive pulmonary disease, unspecified: Secondary | ICD-10-CM

## 2016-03-19 DIAGNOSIS — J3089 Other allergic rhinitis: Secondary | ICD-10-CM | POA: Diagnosis not present

## 2016-03-19 DIAGNOSIS — I10 Essential (primary) hypertension: Secondary | ICD-10-CM | POA: Diagnosis not present

## 2016-03-19 MED ORDER — FLUTICASONE FUROATE-VILANTEROL 200-25 MCG/INH IN AEPB: 1.0000 | INHALATION_SPRAY | Freq: Every day | RESPIRATORY_TRACT | 3 refills | Status: DC

## 2016-03-19 MED ORDER — CETIRIZINE HCL 10 MG PO TABS: 10.0000 mg | ORAL_TABLET | Freq: Every day | ORAL | 3 refills | Status: DC

## 2016-03-19 NOTE — Progress Notes (Signed)
  Subjective:   Patient ID: [name], female    DOB: [birth year], 81 y.o.   MRN: [id number] CC: Follow-up multiple med problems  HPI: [name] is a 81 y.o. female presenting for Follow-up  Sinus congestion for about a month No fevers Feeling fine otherwise Not taking either antihistamine or flonase or mometasone nasal sprays though they are on her medication list  Neck pain: not bothering her anymore Cant turn as far to the L as to the R  Breathing has been well controlled About once or twice a week will need albuterol if she is rushing to get somewhere and gets SOB Albuterol does help  Still has some depression Takes wellbutrin regularly Thinks it is helping Not taking xanax anymore  Still notices memory problems sometimes, will go into a room at home and forget why she was there No further episodes of getting lost when driving, had one several months ago that we talked about at the time Did not tolerate namenda or aricept Seen recently by neurology for memory loss, may be participating in clinical trial in future  Relevant past medical, surgical, family and social history reviewed. Allergies and medications reviewed and updated. History  Smoking Status  . Current Some Day Smoker  . Packs/day: 0.10  . Years: 60.00  . Types: Cigarettes  Smokeless Tobacco  . Never Used   ROS: Per HPI   Objective:    BP 132/71   Pulse 83   Temp 97.1 F (36.2 C) (Oral)   Ht 5\' 4"  (1.626 m)   Wt 159 lb (72.1 kg)   BMI 27.29 kg/m   Wt Readings from Last 3 Encounters:  03/19/16 159 lb (72.1 kg)  02/21/16 155 lb 4 oz (70.4 kg)  02/16/16 158 lb 12.8 oz (72 kg)    Gen: NAD, alert, cooperative with exam, NCAT EYES: EOMI, no conjunctival injection, or no icterus ENT:  TMs pearly gray b/l, OP without erythema LYMPH: no cervical LAD CV: NRRR, normal S1/S2 Resp: CTABL, no wheezes, normal WOB Ext: No edema, warm Neuro: Alert and oriented Psych: normal  affect  Assessment & Plan:  Caran was seen today for follow-up.  Diagnoses and all orders for this visit:  Essential hypertension Well controlled today Cont current meds Labs next visit  Chronic obstructive pulmonary disease, unspecified COPD type (HCC) Cont albuteorl prn Ran out of below Let me know if too expensive at pharmacy -     fluticasone furoate-vilanterol (BREO ELLIPTA) 200-25 MCG/INH AEPB; Inhale 1 puff into the lungs daily.  Chronic allergic rhinitis due to other allergic trigger, unspecified seasonality Take mometasone nasal spray, below daily Let me know if not improving -     cetirizine (ZYRTEC) 10 MG tablet; Take 1 tablet (10 mg total) by mouth at bedtime.  Follow up plan: Return in about 3 months (around 06/19/2016). 06/21/2016, MD Rex Kras [facility]

## 2016-04-02 ENCOUNTER — Ambulatory Visit (INDEPENDENT_AMBULATORY_CARE_PROVIDER_SITE_OTHER): Payer: Medicare Other | Admitting: Family

## 2016-04-02 ENCOUNTER — Encounter: Payer: Self-pay | Admitting: Family

## 2016-04-02 VITALS — BP 143/80 | HR 81 | Temp 97.2°F | Ht 64.0 in | Wt 158.0 lb

## 2016-04-02 DIAGNOSIS — J209 Acute bronchitis, unspecified: Secondary | ICD-10-CM

## 2016-04-02 MED ORDER — BENZONATATE 200 MG PO CAPS: 200.0000 mg | ORAL_CAPSULE | Freq: Three times a day (TID) | ORAL | 1 refills | Status: DC | PRN

## 2016-04-02 MED ORDER — AZITHROMYCIN 250 MG PO TABS: ORAL_TABLET | ORAL | 0 refills | Status: DC

## 2016-04-02 NOTE — Progress Notes (Signed)
   Subjective:    Patient ID: [name], female    DOB: 1935/03/06, 81 y.o.   MRN: [id number]  Cough  This is a new problem. The current episode started in the past 7 days. The problem has been gradually worsening. The problem occurs every few minutes. The cough is productive of purulent sputum. Associated symptoms include chills, headaches, myalgias, nasal congestion, postnasal drip, rhinorrhea, shortness of breath and wheezing. Pertinent negatives include no ear congestion, ear pain, fever or sore throat. The symptoms are aggravated by lying down. She has tried rest and OTC cough suppressant (flonase) for the symptoms. The treatment provided mild relief.      Review of Systems  Constitutional: Positive for chills. Negative for fever.  HENT: Positive for postnasal drip and rhinorrhea. Negative for ear pain and sore throat.   Respiratory: Positive for cough, shortness of breath and wheezing.   Musculoskeletal: Positive for myalgias.  Neurological: Positive for headaches.  All other systems reviewed and are negative.      Objective:   Physical Exam  Constitutional: She is oriented to person, place, and time. She appears well-developed and well-nourished. No distress.  HENT:  Head: Normocephalic and atraumatic.  Right Ear: External ear normal.  Left Ear: External ear normal.  Nose: Mucosal edema and rhinorrhea present.  Mouth/Throat: Posterior oropharyngeal erythema present.  Eyes: Pupils are equal, round, and reactive to light.  Neck: Normal range of motion. Neck supple. No thyromegaly present.  Cardiovascular: Normal rate, regular rhythm, normal heart sounds and intact distal pulses.   No murmur heard. Pulmonary/Chest: Effort normal. No respiratory distress. She has decreased breath sounds. She has no wheezes.  Abdominal: Soft. Bowel sounds are normal. She exhibits no distension. There is no tenderness.  Musculoskeletal: Normal range of motion. She exhibits no edema or  tenderness.  Neurological: She is alert and oriented to person, place, and time. She has normal reflexes. No cranial nerve deficit.  Skin: Skin is warm and dry.  Psychiatric: She has a normal mood and affect. Her behavior is normal. Judgment and thought content normal.  Vitals reviewed.     BP (!) 143/80   Pulse 81   Temp 97.2 F (36.2 C) (Oral)   Ht 5\' 4"  (1.626 m)   Wt 158 lb (71.7 kg)   BMI 27.12 kg/m      Assessment & Plan:  1. Acute bronchitis, unspecified organism - Take meds as prescribed - Use a cool mist humidifier  -Use saline nose sprays frequently -Saline irrigations of the nose can be very helpful if done frequently.  * 4X daily for 1 week*  * Use of a nettie pot can be helpful with this. Follow directions with this* -Force fluids -For any cough or congestion  Use plain Mucinex- regular strength or max strength is fine   * Children- consult with Pharmacist for dosing -For fever or aces or pains- take tylenol or ibuprofen appropriate for age and weight.  * for fevers greater than 101 orally you may alternate ibuprofen and tylenol every  3 hours. -Throat lozenges if help -New toothbrush in 3 days - azithromycin (ZITHROMAX) 250 MG tablet; Take 500 mg once, then 250 mg for four days  Dispense: 6 tablet; Refill: 0 - benzonatate (TESSALON) 200 MG capsule; Take 1 capsule (200 mg total) by mouth 3 (three) times daily as needed.  Dispense: 30 capsule; Refill: 1  , FNP

## 2016-04-02 NOTE — Patient Instructions (Signed)

## 2016-04-05 ENCOUNTER — Encounter: Payer: Self-pay | Admitting: Pediatrics

## 2016-04-05 ENCOUNTER — Ambulatory Visit (INDEPENDENT_AMBULATORY_CARE_PROVIDER_SITE_OTHER): Payer: Medicare Other | Admitting: Pediatrics

## 2016-04-05 ENCOUNTER — Encounter (HOSPITAL_COMMUNITY): Payer: Self-pay | Admitting: Emergency Medicine

## 2016-04-05 ENCOUNTER — Emergency Department (HOSPITAL_COMMUNITY): Payer: Medicare Other

## 2016-04-05 ENCOUNTER — Inpatient Hospital Stay (HOSPITAL_COMMUNITY)
Admission: EM | Admit: 2016-04-05 | Discharge: 2016-04-08 | DRG: 389 | Disposition: A | Discharge status: Home / Self Care | Payer: Medicare Other | Attending: Internal Medicine | Admitting: Internal Medicine

## 2016-04-05 VITALS — BP 142/72 | HR 56 | Temp 96.7°F | Ht 64.0 in

## 2016-04-05 DIAGNOSIS — K429 Umbilical hernia without obstruction or gangrene: Secondary | ICD-10-CM | POA: Diagnosis not present

## 2016-04-05 DIAGNOSIS — M8580 Other specified disorders of bone density and structure, unspecified site: Secondary | ICD-10-CM | POA: Diagnosis not present

## 2016-04-05 DIAGNOSIS — K219 Gastro-esophageal reflux disease without esophagitis: Secondary | ICD-10-CM | POA: Diagnosis not present

## 2016-04-05 DIAGNOSIS — R739 Hyperglycemia, unspecified: Secondary | ICD-10-CM | POA: Diagnosis not present

## 2016-04-05 DIAGNOSIS — Z833 Family history of diabetes mellitus: Secondary | ICD-10-CM | POA: Diagnosis not present

## 2016-04-05 DIAGNOSIS — K5651 Intestinal adhesions [bands], with partial obstruction: Principal | ICD-10-CM | POA: Diagnosis present

## 2016-04-05 DIAGNOSIS — Z66 Do not resuscitate: Secondary | ICD-10-CM | POA: Diagnosis not present

## 2016-04-05 DIAGNOSIS — Z96641 Presence of right artificial hip joint: Secondary | ICD-10-CM | POA: Diagnosis present

## 2016-04-05 DIAGNOSIS — R778 Other specified abnormalities of plasma proteins: Secondary | ICD-10-CM

## 2016-04-05 DIAGNOSIS — K56609 Unspecified intestinal obstruction, unspecified as to partial versus complete obstruction: Secondary | ICD-10-CM | POA: Diagnosis not present

## 2016-04-05 DIAGNOSIS — Z8249 Family history of ischemic heart disease and other diseases of the circulatory system: Secondary | ICD-10-CM

## 2016-04-05 DIAGNOSIS — G309 Alzheimer's disease, unspecified: Secondary | ICD-10-CM | POA: Diagnosis not present

## 2016-04-05 DIAGNOSIS — I5032 Chronic diastolic (congestive) heart failure: Secondary | ICD-10-CM

## 2016-04-05 DIAGNOSIS — Z885 Allergy status to narcotic agent status: Secondary | ICD-10-CM | POA: Diagnosis not present

## 2016-04-05 DIAGNOSIS — R1032 Left lower quadrant pain: Secondary | ICD-10-CM | POA: Diagnosis not present

## 2016-04-05 DIAGNOSIS — I5022 Chronic systolic (congestive) heart failure: Secondary | ICD-10-CM | POA: Diagnosis present

## 2016-04-05 DIAGNOSIS — J449 Chronic obstructive pulmonary disease, unspecified: Secondary | ICD-10-CM | POA: Diagnosis present

## 2016-04-05 DIAGNOSIS — R112 Nausea with vomiting, unspecified: Secondary | ICD-10-CM | POA: Diagnosis not present

## 2016-04-05 DIAGNOSIS — R109 Unspecified abdominal pain: Secondary | ICD-10-CM | POA: Diagnosis not present

## 2016-04-05 DIAGNOSIS — F329 Major depressive disorder, single episode, unspecified: Secondary | ICD-10-CM | POA: Diagnosis present

## 2016-04-05 DIAGNOSIS — M5481 Occipital neuralgia: Secondary | ICD-10-CM | POA: Diagnosis present

## 2016-04-05 DIAGNOSIS — Z7951 Long term (current) use of inhaled steroids: Secondary | ICD-10-CM

## 2016-04-05 DIAGNOSIS — R748 Abnormal levels of other serum enzymes: Secondary | ICD-10-CM | POA: Diagnosis present

## 2016-04-05 DIAGNOSIS — F0280 Dementia in other diseases classified elsewhere without behavioral disturbance: Secondary | ICD-10-CM | POA: Diagnosis present

## 2016-04-05 DIAGNOSIS — E782 Mixed hyperlipidemia: Secondary | ICD-10-CM | POA: Diagnosis not present

## 2016-04-05 DIAGNOSIS — Z888 Allergy status to other drugs, medicaments and biological substances status: Secondary | ICD-10-CM

## 2016-04-05 DIAGNOSIS — I110 Hypertensive heart disease with heart failure: Secondary | ICD-10-CM | POA: Diagnosis not present

## 2016-04-05 DIAGNOSIS — Z7982 Long term (current) use of aspirin: Secondary | ICD-10-CM

## 2016-04-05 DIAGNOSIS — R7989 Other specified abnormal findings of blood chemistry: Secondary | ICD-10-CM

## 2016-04-05 DIAGNOSIS — I10 Essential (primary) hypertension: Secondary | ICD-10-CM | POA: Diagnosis not present

## 2016-04-05 DIAGNOSIS — F0390 Unspecified dementia without behavioral disturbance: Secondary | ICD-10-CM | POA: Diagnosis present

## 2016-04-05 DIAGNOSIS — I429 Cardiomyopathy, unspecified: Secondary | ICD-10-CM | POA: Diagnosis not present

## 2016-04-05 DIAGNOSIS — I447 Left bundle-branch block, unspecified: Secondary | ICD-10-CM | POA: Diagnosis not present

## 2016-04-05 DIAGNOSIS — R05 Cough: Secondary | ICD-10-CM | POA: Diagnosis not present

## 2016-04-05 DIAGNOSIS — Z880 Allergy status to penicillin: Secondary | ICD-10-CM | POA: Diagnosis not present

## 2016-04-05 DIAGNOSIS — F03A0 Unspecified dementia, mild, without behavioral disturbance, psychotic disturbance, mood disturbance, and anxiety: Secondary | ICD-10-CM | POA: Diagnosis present

## 2016-04-05 DIAGNOSIS — F17210 Nicotine dependence, cigarettes, uncomplicated: Secondary | ICD-10-CM | POA: Diagnosis present

## 2016-04-05 DIAGNOSIS — K565 Intestinal adhesions [bands], unspecified as to partial versus complete obstruction: Secondary | ICD-10-CM

## 2016-04-05 DIAGNOSIS — Z79899 Other long term (current) drug therapy: Secondary | ICD-10-CM

## 2016-04-05 DIAGNOSIS — F419 Anxiety disorder, unspecified: Secondary | ICD-10-CM | POA: Diagnosis present

## 2016-04-05 DIAGNOSIS — Z90710 Acquired absence of both cervix and uterus: Secondary | ICD-10-CM

## 2016-04-05 HISTORY — DX: Occipital neuralgia: M54.81

## 2016-04-05 HISTORY — DX: Urgency of urination: R39.15

## 2016-04-05 HISTORY — DX: Other specified abnormalities of plasma proteins: R77.8

## 2016-04-05 HISTORY — DX: Other specified abnormal findings of blood chemistry: R79.89

## 2016-04-05 LAB — URINALYSIS, ROUTINE W REFLEX MICROSCOPIC
BILIRUBIN URINE: NEGATIVE
Glucose, UA: NEGATIVE mg/dL
Hgb urine dipstick: NEGATIVE
KETONES UR: NEGATIVE mg/dL
LEUKOCYTES UA: NEGATIVE
NITRITE: NEGATIVE
PROTEIN: NEGATIVE mg/dL
Specific Gravity, Urine: 1.039 — ABNORMAL HIGH (ref 1.005–1.030)
pH: 8 (ref 5.0–8.0)

## 2016-04-05 LAB — CBC WITH DIFFERENTIAL/PLATELET
BASOS ABS: 0 10*3/uL (ref 0.0–0.1)
BASOS PCT: 0 %
EOS ABS: 0.1 10*3/uL (ref 0.0–0.7)
Eosinophils Relative: 1 %
HCT: 42.3 % (ref 36.0–46.0)
Hemoglobin: 14.1 g/dL (ref 12.0–15.0)
Lymphocytes Relative: 16 %
Lymphs Abs: 1.8 10*3/uL (ref 0.7–4.0)
MCH: 30.3 pg (ref 26.0–34.0)
MCHC: 33.3 g/dL (ref 30.0–36.0)
MCV: 91 fL (ref 78.0–100.0)
MONO ABS: 0.3 10*3/uL (ref 0.1–1.0)
MONOS PCT: 3 %
NEUTROS ABS: 9.2 10*3/uL — AB (ref 1.7–7.7)
Neutrophils Relative %: 80 %
PLATELETS: 293 10*3/uL (ref 150–400)
RBC: 4.65 MIL/uL (ref 3.87–5.11)
RDW: 15 % (ref 11.5–15.5)
WBC: 11.5 10*3/uL — ABNORMAL HIGH (ref 4.0–10.5)

## 2016-04-05 LAB — COMPREHENSIVE METABOLIC PANEL
ALT: 25 U/L (ref 14–54)
AST: 29 U/L (ref 15–41)
Albumin: 3.8 g/dL (ref 3.5–5.0)
Alkaline Phosphatase: 81 U/L (ref 38–126)
Anion gap: 7 (ref 5–15)
BILIRUBIN TOTAL: 0.4 mg/dL (ref 0.3–1.2)
BUN: 21 mg/dL — AB (ref 6–20)
CHLORIDE: 109 mmol/L (ref 101–111)
CO2: 25 mmol/L (ref 22–32)
CREATININE: 1.12 mg/dL — AB (ref 0.44–1.00)
Calcium: 9.1 mg/dL (ref 8.9–10.3)
GFR, EST AFRICAN AMERICAN: 53 mL/min — AB (ref >60)
GFR, EST NON AFRICAN AMERICAN: 46 mL/min — AB (ref >60)
Glucose, Bld: 116 mg/dL — ABNORMAL HIGH (ref 65–99)
POTASSIUM: 4 mmol/L (ref 3.5–5.1)
Sodium: 141 mmol/L (ref 135–145)
TOTAL PROTEIN: 7 g/dL (ref 6.5–8.1)

## 2016-04-05 LAB — LIPASE, BLOOD: LIPASE: 19 U/L (ref 11–51)

## 2016-04-05 LAB — TROPONIN I
TROPONIN I: 0.04 ng/mL — AB (ref <0.03)
TROPONIN I: 0.04 ng/mL — AB (ref <0.03)

## 2016-04-05 LAB — LACTIC ACID, PLASMA: LACTIC ACID, VENOUS: 1.5 mmol/L (ref 0.5–1.9)

## 2016-04-05 MED ORDER — ONDANSETRON HCL 4 MG PO TABS: 4.0000 mg | ORAL_TABLET | Freq: Four times a day (QID) | ORAL | Status: DC | PRN

## 2016-04-05 MED ORDER — CARVEDILOL 3.125 MG PO TABS
6.2500 mg | ORAL_TABLET | Freq: Two times a day (BID) | ORAL | Status: DC
  Administered 2016-04-06 – 2016-04-08 (×5): 6.25 mg via ORAL
  Filled 2016-04-05 (×5): qty 2

## 2016-04-05 MED ORDER — FLUTICASONE PROPIONATE 50 MCG/ACT NA SUSP
2.0000 | Freq: Every day | NASAL | Status: DC
  Administered 2016-04-06 – 2016-04-08 (×3): 2 via NASAL
  Filled 2016-04-05: qty 16

## 2016-04-05 MED ORDER — MORPHINE SULFATE (PF) 2 MG/ML IV SOLN
2.0000 mg | INTRAVENOUS | Status: AC | PRN
Start: 2016-04-05 — End: 2016-04-05
  Administered 2016-04-05 (×2): 2 mg via INTRAVENOUS
  Filled 2016-04-05 (×2): qty 1

## 2016-04-05 MED ORDER — MORPHINE SULFATE (PF) 2 MG/ML IV SOLN
2.0000 mg | INTRAVENOUS | Status: DC | PRN
  Administered 2016-04-05 – 2016-04-06 (×3): 2 mg via INTRAVENOUS
  Filled 2016-04-05 (×3): qty 1

## 2016-04-05 MED ORDER — ONDANSETRON HCL 4 MG/2ML IJ SOLN
4.0000 mg | INTRAMUSCULAR | Status: AC | PRN
  Administered 2016-04-05 (×2): 4 mg via INTRAVENOUS
  Filled 2016-04-05 (×2): qty 2

## 2016-04-05 MED ORDER — ALBUTEROL SULFATE (2.5 MG/3ML) 0.083% IN NEBU: 2.5000 mg | INHALATION_SOLUTION | Freq: Four times a day (QID) | RESPIRATORY_TRACT | Status: DC | PRN

## 2016-04-05 MED ORDER — FLUTICASONE FUROATE-VILANTEROL 200-25 MCG/INH IN AEPB
1.0000 | INHALATION_SPRAY | Freq: Every day | RESPIRATORY_TRACT | Status: DC
  Administered 2016-04-06 – 2016-04-08 (×3): 1 via RESPIRATORY_TRACT
  Filled 2016-04-05: qty 28

## 2016-04-05 MED ORDER — IOPAMIDOL (ISOVUE-300) INJECTION 61%
100.0000 mL | Freq: Once | INTRAVENOUS | Status: AC | PRN
  Administered 2016-04-05: 100 mL via INTRAVENOUS

## 2016-04-05 MED ORDER — RALOXIFENE HCL 60 MG PO TABS
60.0000 mg | ORAL_TABLET | Freq: Every day | ORAL | Status: DC
  Administered 2016-04-06 – 2016-04-08 (×3): 60 mg via ORAL
  Filled 2016-04-05 (×3): qty 1

## 2016-04-05 MED ORDER — BUPROPION HCL ER (SR) 150 MG PO TB12
150.0000 mg | ORAL_TABLET | Freq: Every day | ORAL | Status: DC
  Administered 2016-04-06 – 2016-04-08 (×3): 150 mg via ORAL
  Filled 2016-04-05 (×4): qty 1

## 2016-04-05 MED ORDER — ONDANSETRON HCL 4 MG/2ML IJ SOLN
4.0000 mg | Freq: Four times a day (QID) | INTRAMUSCULAR | Status: DC | PRN
  Administered 2016-04-05 – 2016-04-06 (×2): 4 mg via INTRAVENOUS
  Filled 2016-04-05 (×2): qty 2

## 2016-04-05 MED ORDER — LOSARTAN POTASSIUM 50 MG PO TABS
25.0000 mg | ORAL_TABLET | Freq: Every day | ORAL | Status: DC
  Administered 2016-04-06 – 2016-04-08 (×3): 25 mg via ORAL
  Filled 2016-04-05 (×3): qty 1

## 2016-04-05 MED ORDER — ACETAMINOPHEN 325 MG PO TABS
650.0000 mg | ORAL_TABLET | Freq: Four times a day (QID) | ORAL | Status: DC | PRN
  Administered 2016-04-06: 650 mg via ORAL
  Filled 2016-04-05: qty 2

## 2016-04-05 MED ORDER — ASPIRIN EC 81 MG PO TBEC
81.0000 mg | DELAYED_RELEASE_TABLET | Freq: Every day | ORAL | Status: DC
  Administered 2016-04-06 – 2016-04-08 (×3): 81 mg via ORAL
  Filled 2016-04-05 (×3): qty 1

## 2016-04-05 MED ORDER — LORATADINE 10 MG PO TABS
10.0000 mg | ORAL_TABLET | Freq: Every day | ORAL | Status: DC
  Administered 2016-04-06 – 2016-04-08 (×3): 10 mg via ORAL
  Filled 2016-04-05 (×3): qty 1

## 2016-04-05 MED ORDER — ACETAMINOPHEN 650 MG RE SUPP: 650.0000 mg | Freq: Four times a day (QID) | RECTAL | Status: DC | PRN

## 2016-04-05 MED ORDER — ENOXAPARIN SODIUM 40 MG/0.4ML ~~LOC~~ SOLN
40.0000 mg | SUBCUTANEOUS | Status: DC
  Administered 2016-04-05 – 2016-04-07 (×3): 40 mg via SUBCUTANEOUS
  Filled 2016-04-05 (×3): qty 0.4

## 2016-04-05 MED ORDER — SODIUM CHLORIDE 0.9 % IV SOLN
INTRAVENOUS | Status: DC
  Administered 2016-04-05 – 2016-04-07 (×4): via INTRAVENOUS

## 2016-04-05 MED ORDER — PANTOPRAZOLE SODIUM 40 MG PO TBEC
80.0000 mg | DELAYED_RELEASE_TABLET | Freq: Every day | ORAL | Status: DC
  Administered 2016-04-05 – 2016-04-08 (×4): 80 mg via ORAL
  Filled 2016-04-05 (×4): qty 2

## 2016-04-05 MED ORDER — IOPAMIDOL (ISOVUE-300) INJECTION 61%
INTRAVENOUS | Status: AC
  Administered 2016-04-05: 15 mL via ORAL
  Filled 2016-04-05: qty 30

## 2016-04-05 NOTE — ED Triage Notes (Signed)
Pt reports n/v/d and weakness that started at approx 1130 today. Pt sent by Ignacia Bayley for eval.

## 2016-04-05 NOTE — Progress Notes (Signed)
  Subjective:   Patient ID: [name], female    DOB: May 19, 1934, 81 y.o.   MRN: [id number] CC: Nausea; Fatigue; and Dizziness  HPI: [name] is a 81 y.o. 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 Allergies and medications reviewed and updated. History  Smoking Status  . Current Some Day Smoker  . Packs/day: 0.10  . Years: 60.00  . Types: Cigarettes  Smokeless Tobacco  . Never Used   ROS: Per HPI   Objective:    BP (!) 142/72   Pulse (!) 56   Temp (!) 96.7 F (35.9 C) (Oral)   Ht 5\' 4"  (1.626 m)   Wt Readings from Last 3 Encounters:  04/02/16 158 lb (71.7 kg)  03/19/16 159 lb (72.1 kg)  02/21/16 155 lb 4 oz (70.4 kg)    Gen: appears uncomfortable, eyes closed but awake, opens them when talked to, alert, cooperative with exam EYES: no conjunctival injection, or no icterus CV: NRRR, normal S1/S2 Resp: CTABL, no wheezes, normal WOB Abd: +BS, soft, very tender with palpation LLQ, pain in LLQ with any movement of L leg, ND. some guarding or no organomegaly Ext: No edema, warm Neuro: Alert and oriented, able to answer questions appropriately Moving constantly to try to find comfortable position  Assessment & Plan:  [name] was seen today for nausea, fatigue and dizziness. Acute onset this morning Felt fine  when she woke up Symptoms worsening  Now nauseous, with constant abd pain, worst in LLQ On exam very TTP LLQ Has not been able to keep anything down One episode of emesis at home before coming to clinic Two small stools, last now in clinic Concern for acute abd process, discussed with pt and daughter [name] who is with her in clinic Want her to be evaluated in emergency room given worsening symptoms and exam, likely needs imaging and labs tonight Pt/daughter in agreement with plan, daughter comfortable driving her to [facility] from clinic 4 mg ondansetron given in clinic  Diagnoses and all orders for this visit:  Left lower quadrant pain   Follow up plan: As needed Jeani Hawking, MD Rex Kras [facility]

## 2016-04-05 NOTE — ED Provider Notes (Signed)
[facility] Provider Note   CSN: [id number] Arrival date & time: 04/05/16  1642     History   Chief Complaint Chief Complaint  Patient presents with  . Weakness  . Abdominal Pain  . Emesis    HPI [name] is a 81 y.o. female.  HPI  Pt was seen at 1655.  Per pt, c/o gradual onset and persistence of constant left lower abd "pain" since this morning at 1130.  Has been associated with multiple intermittent episodes of N/V and generalized weakness.  Pt was evaluated by her PMD PTA, then sent to the ED for evaluation.  Denies diarrhea, no fevers, no back pain, no rash, no CP/SOB, no black or blood in stools or emesis.      Past Medical History:  Diagnosis Date  . Allergic rhinitis   . Anxiety   . Arthritis   . Cataract    had eye surgery  . Depression   . Essential hypertension, benign   . GERD (gastroesophageal reflux disease)   . History of blood transfusion   . Left bundle branch block   . Memory loss   . Mixed hyperlipidemia   . Occipital neuralgia of left side   . Osteopenia   . Secondary cardiomyopathy (HCC)    LVEF 40-45%, likely nonischemic    Patient Active Problem List   Diagnosis Date Noted  . Syncope 11/15/2015  . Nausea and vomiting 11/15/2015  . Dehydration 10/05/2015  . UTI (lower urinary tract infection) 10/05/2015  . Dementia 09/13/2015  . Memory problem 05/07/2015  . COPD (chronic obstructive pulmonary disease) (HCC) 04/19/2015  . Neck pain 04/06/2015  . Insomnia 12/10/2014  . BMI 28.0-28.9,adult 12/10/2014  . Osteopenia 05/19/2014  . GAD (generalized anxiety disorder) 01/06/2014  . Depression 01/06/2014  . GERD (gastroesophageal reflux disease) 01/06/2014  . Vitamin D deficiency 01/06/2014  . Overactive bladder 01/06/2014  . Secondary cardiomyopathy (HCC) 10/06/2013  . Left bundle branch block 10/06/2013  . Dizziness 10/04/2013  . Osteoarthritis of right hip 03/14/2013  . Allergic rhinitis 12/07/2008  . Chronic cough  12/07/2008  . Hepatitis B virus infection 08/16/2008  . Hyperlipidemia 08/16/2008  . Essential hypertension 08/16/2008  . Osteoarthritis 08/16/2008  . DISC DEGENERATION 07/24/2007    Past Surgical History:  Procedure Laterality Date  . APPENDECTOMY    . BACK SURGERY    . CARDIAC CATHETERIZATION  2010   Normal coronaries  . CATARACT EXTRACTION W/PHACO  04/16/2011   Procedure: CATARACT EXTRACTION PHACO AND INTRAOCULAR LENS PLACEMENT (IOC);  Surgeon: Susa Simmonds;  Location: [facility];  Service: Ophthalmology;  Laterality: Left;  CDE=11.35  . CATARACT EXTRACTION W/PHACO  01/28/2012   Procedure: CATARACT EXTRACTION PHACO AND INTRAOCULAR LENS PLACEMENT (IOC);  Surgeon: Susa Simmonds, MD;  Location: [facility];  Service: Ophthalmology;  Laterality: Right;  CDI:8.15  . CYSTO WITH HYDRODISTENSION  05/29/2012   Procedure: CYSTOSCOPY/HYDRODISTENSION;  Surgeon: Martina Sinner, MD;  Location: [facility];  Service: Urology;  Laterality: N/A;  INSTILLATION OF MARCAINE AND PYRIDIUM   . DILATION AND CURETTAGE OF UTERUS  ?<hysterectomy"  . HAMMER TOE SURGERY Bilateral 11/22/11   [facility], [name]  . JOINT REPLACEMENT    . LAPAROSCOPIC CHOLECYSTECTOMY  1991  . LUMBAR LAMINECTOMY  2011  . LUMBAR LAMINECTOMY/DECOMPRESSION MICRODISCECTOMY Left 06/06/2012   Procedure: LUMBAR LAMINECTOMY/DECOMPRESSION MICRODISCECTOMY 1 LEVEL;  Surgeon: Maeola Harman, MD;  Location: [facility];  Service: Neurosurgery;  Laterality: Left;  Left Lumbar two-three Laminectomy for resection of synovial  cyst  . SHOULDER OPEN ROTATOR CUFF REPAIR Right 2011  . TONSILLECTOMY    . TOTAL ABDOMINAL HYSTERECTOMY    . TOTAL HIP ARTHROPLASTY Right 03/13/2013   Procedure: TOTAL HIP ARTHROPLASTY;  Surgeon: Nestor Lewandowsky, MD;  Location: [facility];  Service: Orthopedics;  Laterality: Right;    OB History    Gravida Para Term Preterm AB Living   5 5 5     4    SAB TAB Ectopic Multiple Live Births                   Home  Medications    Prior to Admission medications   Medication Sig Start Date End Date Taking? Authorizing Provider  albuterol (PROVENTIL HFA;VENTOLIN HFA) 108 (90 Base) MCG/ACT inhaler Inhale 2 puffs into the lungs every 6 (six) hours as needed for wheezing or shortness of breath. 01/09/16   Elige Radon Dettinger, MD  aspirin EC 81 MG tablet Take 81 mg by mouth daily.    Historical Provider, MD  azithromycin (ZITHROMAX) 250 MG tablet Take 500 mg once, then 250 mg for four days Patient not taking: Reported on 04/05/2016 04/02/16   Junie Spencer, FNP  benzonatate (TESSALON) 200 MG capsule Take 1 capsule (200 mg total) by mouth 3 (three) times daily as needed. 04/02/16   Junie Spencer, FNP  buPROPion (WELLBUTRIN SR) 150 MG 12 hr tablet Take 1 tablet (150 mg total) by mouth 2 (two) times daily. Patient taking differently: Take 150 mg by mouth daily.  10/13/15   Junie Spencer, FNP  carvedilol (COREG) 6.25 MG tablet Take 1 tablet (6.25 mg total) by mouth 2 (two) times daily with a meal. 10/13/15   Junie Spencer, FNP  cetirizine (ZYRTEC) 10 MG tablet Take 1 tablet (10 mg total) by mouth at bedtime. 03/19/16   [name], MD  cyclobenzaprine (FLEXERIL) 5 MG tablet Take 0.5-1 tablets (2.5-5 mg total) by mouth 3 (three) times daily as needed for muscle spasms. 12/08/15   [name], MD  esomeprazole (NEXIUM) 40 MG capsule Take 1 capsule (40 mg total) by mouth daily before breakfast. 10/13/15   Junie Spencer, FNP  fluticasone furoate-vilanterol (BREO ELLIPTA) 200-25 MCG/INH AEPB Inhale 1 puff into the lungs daily. 03/19/16   [name], MD  ibuprofen (ADVIL,MOTRIN) 600 MG tablet Take 1 tablet (600 mg total) by mouth every 8 (eight) hours as needed. 12/08/15   [name], MD  losartan (COZAAR) 50 MG tablet Take 0.5 tablets (25 mg total) by mouth daily. 10/13/15   Junie Spencer, FNP  mometasone (NASONEX) 50 MCG/ACT nasal spray Place 2 sprays into the nose daily. 03/12/16   [name], MD    Multiple Vitamins-Minerals (CENTRUM SILVER PO) Take 1 tablet by mouth daily.    Historical Provider, MD  ondansetron (ZOFRAN) 4 MG tablet Take 1 tablet every 8 hours as needed for nausea or vomiting. 02/07/16   [name], MD  raloxifene (EVISTA) 60 MG tablet Take 1 tablet (60 mg total) by mouth daily. 10/13/15   Junie Spencer, FNP  triamcinolone (KENALOG) 0.025 % ointment Apply 1 application topically 2 (two) times daily. 02/16/16   [name], MD  vitamin B-12 (CYANOCOBALAMIN) 1000 MCG tablet Take 1,000 mcg by mouth daily.    Historical Provider, MD    Family History Family History  Problem Relation Age of Onset  . Diabetes Father   . Diabetes Brother   . Cancer Brother  84    colon  . Diabetes Brother   . Diabetes Brother   . Hypertension Sister   . Diabetes Sister   . Diabetes Sister   . Diabetes Other   . Allergies Other   . Colon cancer Neg Hx   . Anesthesia problems Neg Hx   . Hypotension Neg Hx   . Malignant hyperthermia Neg Hx   . Pseudochol deficiency Neg Hx     Social History Social History  Substance Use Topics  . Smoking status: Current Some Day Smoker    Packs/day: 0.10    Years: 60.00    Types: Cigarettes  . Smokeless tobacco: Never Used  . Alcohol use No     Allergies   Lisinopril-hydrochlorothiazide; Codeine; and Penicillins   Review of Systems Review of Systems ROS: Statement: All systems negative except as marked or noted in the HPI; Constitutional: Negative for fever and chills. +generalized weakness.; ; Eyes: Negative for eye pain, redness and discharge. ; ; ENMT: Negative for ear pain, hoarseness, nasal congestion, sinus pressure and sore throat. ; ; Cardiovascular: Negative for chest pain, palpitations, diaphoresis, dyspnea and peripheral edema. ; ; Respiratory: Negative for cough, wheezing and stridor. ; ; Gastrointestinal: +N/V, abd pain. Negative for diarrhea, blood in stool, hematemesis, jaundice and rectal bleeding. . ; ;  Genitourinary: Negative for dysuria, flank pain and hematuria. ; ; Musculoskeletal: Negative for back pain and neck pain. Negative for swelling and trauma.; ; Skin: Negative for pruritus, rash, abrasions, blisters, bruising and skin lesion.; ; Neuro: Negative for headache, lightheadedness and neck stiffness. Negative for altered level of consciousness, altered mental status, extremity weakness, paresthesias, involuntary movement, seizure and syncope.      Physical Exam Updated Vital Signs BP 151/65 (BP Location: Left Arm)   Pulse 64   Temp 97.6 F (36.4 C) (Oral)   Resp 18   Ht 5\' 4"  (1.626 m)   Wt 158 lb (71.7 kg)   SpO2 100%   BMI 27.12 kg/m    17:01:28 Orthostatic Vital Signs TV  Orthostatic Lying   BP- Lying: 157/72  Pulse- Lying: 50      Orthostatic Sitting  BP- Sitting: 151/57  Pulse- Sitting: 65      Orthostatic Standing at 0 minutes  BP- Standing at 0 minutes:  (pt states she is unable to stand due to weakness and nausea)  Pulse- Standing at 0 minutes:  (pt states she is unable to stand due to weakness and nausea)     Physical Exam 1700: Physical examination:  Nursing notes reviewed; Vital signs and O2 SAT reviewed;  Constitutional: Well developed, Well nourished, Well hydrated, Uncomfortable appearing; Head:  Normocephalic, atraumatic; Eyes: EOMI, PERRL, No scleral icterus; ENMT: Mouth and pharynx normal, Mucous membranes moist; Neck: Supple, Full range of motion, No lymphadenopathy; Cardiovascular: Regular rate and rhythm, No gallop; Respiratory: Breath sounds clear & equal bilaterally, No wheezes.  Speaking full sentences with ease, Normal respiratory effort/excursion; Chest: Nontender, Movement normal; Abdomen: Soft, +LLQ tenderness to palp. Nondistended, Normal bowel sounds; Genitourinary: No CVA tenderness; Extremities: Pulses normal, No tenderness, No edema, No calf edema or asymmetry.; Neuro: AA&Ox3, Major CN grossly intact.  Speech clear. No gross focal motor or  sensory deficits in extremities.; Skin: Color normal, Warm, Dry.   ED Treatments / Results  Labs (all labs ordered are listed, but only abnormal results are displayed)   EKG  EKG Interpretation  Date/Time:  Thursday April 05 2016 17:00:38 EST Ventricular Rate:  54 PR Interval:  QRS Duration: 139 QT Interval:  462 QTC Calculation: 438 R Axis:   53 Text Interpretation:  Sinus rhythm LAE, consider biatrial enlargement Left bundle branch block When compared with ECG of 01/22/2016 No significant change was found Confirmed by [facility]  MD, Nicholos Johns [phone]) on 04/05/2016 5:23:39 PM       Radiology   Procedures Procedures (including critical care time)  Medications Ordered in ED Medications  ondansetron (ZOFRAN) injection 4 mg (4 mg Intravenous Given 04/05/16 1710)  morphine 2 MG/ML injection 2 mg (2 mg Intravenous Given 04/05/16 1710)  iopamidol (ISOVUE-300) 61 % injection (not administered)     Initial Impression / Assessment and Plan / ED Course  I have reviewed the triage vital signs and the nursing notes.  Pertinent labs & imaging results that were available during my care of the patient were reviewed by me and considered in my medical decision making (see chart for details).  MDM Reviewed: previous chart, nursing note and vitals Reviewed previous: labs and ECG Interpretation: labs, ECG, x-ray and CT scan    Results for orders placed or performed during the hospital encounter of 04/05/16  Comprehensive metabolic panel  Result Value Ref Range   Sodium 141 135 - 145 mmol/L   Potassium 4.0 3.5 - 5.1 mmol/L   Chloride 109 101 - 111 mmol/L   CO2 25 22 - 32 mmol/L   Glucose, Bld 116 (H) 65 - 99 mg/dL   BUN 21 (H) 6 - 20 mg/dL   Creatinine, Ser 8.30 (H) 0.44 - 1.00 mg/dL   Calcium 9.1 8.9 - 94.0 mg/dL   Total Protein 7.0 6.5 - 8.1 g/dL   Albumin 3.8 3.5 - 5.0 g/dL   AST 29 15 - 41 U/L   ALT 25 14 - 54 U/L   Alkaline Phosphatase 81 38 - 126 U/L   Total Bilirubin  0.4 0.3 - 1.2 mg/dL   GFR calc non Af Amer 46 (L) >60 mL/min   GFR calc Af Amer 53 (L) >60 mL/min   Anion gap 7 5 - 15  Lipase, blood  Result Value Ref Range   Lipase 19 11 - 51 U/L  Lactic acid, plasma  Result Value Ref Range   Lactic Acid, Venous 1.5 0.5 - 1.9 mmol/L  CBC with Differential  Result Value Ref Range   WBC 11.5 (H) 4.0 - 10.5 K/uL   RBC 4.65 3.87 - 5.11 MIL/uL   Hemoglobin 14.1 12.0 - 15.0 g/dL   HCT 76.8 08.8 - 11.0 %   MCV 91.0 78.0 - 100.0 fL   MCH 30.3 26.0 - 34.0 pg   MCHC 33.3 30.0 - 36.0 g/dL   RDW 31.5 94.5 - 85.9 %   Platelets 293 150 - 400 K/uL   Neutrophils Relative % 80 %   Neutro Abs 9.2 (H) 1.7 - 7.7 K/uL   Lymphocytes Relative 16 %   Lymphs Abs 1.8 0.7 - 4.0 K/uL   Monocytes Relative 3 %   Monocytes Absolute 0.3 0.1 - 1.0 K/uL   Eosinophils Relative 1 %   Eosinophils Absolute 0.1 0.0 - 0.7 K/uL   Basophils Relative 0 %   Basophils Absolute 0.0 0.0 - 0.1 K/uL  Troponin I  Result Value Ref Range   Troponin I 0.04 (HH) <0.03 ng/mL   Dg Chest 2 View Result Date: 04/05/2016 CLINICAL DATA:  Nausea, vomiting, abdominal pain.  Productive cough. EXAM: CHEST  2 VIEW COMPARISON:  11/14/2015 FINDINGS: Stable mild cardiomegaly and mediastinal contours. There is  thoracic aortic atherosclerosis. Bibasilar atelectasis without confluent airspace disease. No pulmonary edema or pleural fluid. No pneumothorax. No acute osseous abnormality. IMPRESSION: 1. Bibasilar atelectasis. 2. Stable mild cardiomegaly and thoracic aortic atherosclerosis. Electronically Signed   By: Rubye OaksMelanie  Ehinger M.D.   On: 04/05/2016 18:24   Ct Abdomen Pelvis W Contrast Result Date: 04/05/2016 CLINICAL DATA:  Nausea, vomiting and abdominal pain since 11:30 a.m. today. History of appendectomy and hysterectomy. EXAM: CT ABDOMEN AND PELVIS WITH CONTRAST TECHNIQUE: Multidetector CT imaging of the abdomen and pelvis was performed using the standard protocol following bolus administration of  intravenous contrast. CONTRAST:  100mL ISOVUE-300 IOPAMIDOL (ISOVUE-300) INJECTION 61% COMPARISON:  10/04/2015 CT FINDINGS: Lower chest: Chronic scarring in the periphery of both lower lobes with dependent atelectasis. Borderline cardiomegaly without pericardial effusion. No pleural effusion or pneumothorax. Faint ground-glass opacities in the posterior aspect of both lower lobes may be secondary to mild left alveolitis or pneumonitis. There is a small hiatal hernia. Hepatobiliary: 7 mm rounded nonenhancing hypodensity in the left hepatic lobe is likely related to a cyst and is unchanged in appearance. The gallbladder is surgically absent. There is no biliary dilatation. Pancreas: Normal Spleen: Normal Adrenals/Urinary Tract: Unremarkable adrenal glands. The kidneys demonstrate no abnormal enhancement nor obstruction. Bladder is physiologically distended. No ureteral stones are seen. Stomach/Bowel: Mild diffuse fluid-filled distention of mid to distal ileal loops up to 2 cm may be secondary to an area stenosis seen involving the distal ileum noted on series 2, image 55 and 56. This could be postinflammatory in etiology. Small bowel neoplasm cannot be entirely excluded. No adjacent lymphadenopathy. The patient is status post appendectomy. There is a moderate amount of fecal retention throughout large bowel with scattered diverticulosis. Vascular/Lymphatic: Aortoiliac atherosclerosis without aneurysm. Reproductive: Hysterectomy.  No adnexal mass. Other:  No free air ascites.  Small fat containing umbilical hernia. Musculoskeletal: Right hip arthroplasty. L4-5 lumbar spinal fusion with interbody block noted. Disc space narrowing at L5-S1. No suspicious osseous abnormalities. IMPRESSION: Mild fluid-filled distention of mid to distal ileum secondary to a short segmental area of luminal narrowing involving the distal ileum. Findings may represent early or partial SBO secondary to a stricture. Neoplasm is not entirely  excluded. Diffuse colonic diverticulosis without acute diverticulitis.  Bold Stable 7 mm hypodensity in the left hepatic lobe consistent with a cyst. Gallbladder surgically absent. Appendectomy and hysterectomy. Small fat containing umbilical hernia. Right hip arthroplasty as well as L4-5 lumbar spinal fusion. Faint ground-glass opacities in the lung bases likely related to atelectasis. Alveolitis/pneumonitis not entirely excluded. Electronically Signed   By: Tollie Ethavid  Kwon M.D.   On: 04/05/2016 18:58    1945:  No N/V while in the ED. Unable to stand due to generalized weakness. Troponin chronically elevated.  Dx and testing d/w pt and family.  Questions answered.  Verb understanding, agreeable to admit.  T/C to [facility] Dr. Ophelia CharterYates, case discussed, including:  HPI, pertinent PM/SHx, VS/PE, dx testing, ED course and treatment:  Agreeable to admit, requests to write temporary orders, obtain observation medical bed to team [facility].    Final Clinical Impressions(s) / ED Diagnoses   Final diagnoses:  None    New Prescriptions New Prescriptions   No medications on file     Samuel JesterKathleen Bryana Froemming, DO 04/07/16 2243

## 2016-04-05 NOTE — ED Notes (Signed)
CRITICAL VALUE ALERT  Critical value received:  Troponin 0.04  Date of notification:  04/05/2016  Time of notification:  1834  Critical value read back:yes  Nurse who received alert:  Rory Percy RN  MD notified (1st page):  Clarene Duke  Time of first page:  1834  MD notified (2nd page):  Time of second page:  Responding MD:  Clarene Duke  Time MD responded:  [phone]

## 2016-04-05 NOTE — H&P (Addendum)
History and Physical    [name] YQI:[id number] DOB: [birth year] DOA: 04/05/2016  PCP: [name], MD Consultants:  Cardiology - [place]; Urology - [place] Patient coming from: home - lives with son; Jackey Loge [phone]  Chief Complaint: abdominal pain  HPI: [name] is a 81 y.o. female with medical history significant of HLD, memory loss, HTN presenting with acute onset of abdominal pain.  She reports that she has been "so sick today".  About 11 AM, she started feeling weak, sick.  Difficulty walking.  Sweeat pouring off her.  Stomch hurting in LLQ.  Got up and went to work, feeling fine prior.  +n/v, no blood.  Last BM this AM, normal; had another normal BM about 4pm.  Has had pain medication twice in ER, pain improves but when it wears off the pain comes back.  Pain was constant prior to pain medication.  Worse with moving but really nothing helped.     ED Course: Per Dr. Clarene Duke: 1945: No N/V while in the ED. Unable to stand due to generalized weakness. Troponin chronically elevated. Dx and testing d/w pt and family. Questions answered. Verb understanding, agreeable to admit. T/C to [facility]. [name], case discussed, including: HPI, pertinent PM/SHx, VS/PE, dx testing, ED course and treatment: Agreeable to admit, requests to write temporary orders, obtain observation medical bed to team [facility]mits.   Review of Systems: As per HPI; otherwise 10 point review of systems reviewed and negative.   Ambulatory Status:  Ambulates without assistance.  Past Medical History:  Diagnosis Date  . Allergic rhinitis   . Anxiety   . Arthritis   . Cataract    had eye surgery  . Depression   . Essential hypertension, benign   . GERD (gastroesophageal reflux disease)   . History of blood transfusion   . Left bundle branch block   . Memory loss   . Mixed hyperlipidemia   . Occipital neuralgia of left side   . Osteopenia   . Secondary cardiomyopathy (HCC)    LVEF 40-45%, likely  nonischemic  . Urinary urgency     Past Surgical History:  Procedure Laterality Date  . APPENDECTOMY    . BACK SURGERY    . CARDIAC CATHETERIZATION  2010   Normal coronaries  . CATARACT EXTRACTION W/PHACO  04/16/2011   Procedure: CATARACT EXTRACTION PHACO AND INTRAOCULAR LENS PLACEMENT (IOC);  Surgeon: Susa Simmonds;  Location: [facility];  Service: Ophthalmology;  Laterality: Left;  CDE=11.35  . CATARACT EXTRACTION W/PHACO  01/28/2012   Procedure: CATARACT EXTRACTION PHACO AND INTRAOCULAR LENS PLACEMENT (IOC);  Surgeon: Susa Simmonds, MD;  Location: [facility];  Service: Ophthalmology;  Laterality: Right;  CDI:8.15  . CYSTO WITH HYDRODISTENSION  05/29/2012   Procedure: CYSTOSCOPY/HYDRODISTENSION;  Surgeon: Martina Sinner, MD;  Location: [facility];  Service: Urology;  Laterality: N/A;  INSTILLATION OF MARCAINE AND PYRIDIUM   . DILATION AND CURETTAGE OF UTERUS  ?<hysterectomy"  . HAMMER TOE SURGERY Bilateral 11/22/11   [facility], [name]  . JOINT REPLACEMENT    . LAPAROSCOPIC CHOLECYSTECTOMY  1991  . LUMBAR LAMINECTOMY  2011  . LUMBAR LAMINECTOMY/DECOMPRESSION MICRODISCECTOMY Left 06/06/2012   Procedure: LUMBAR LAMINECTOMY/DECOMPRESSION MICRODISCECTOMY 1 LEVEL;  Surgeon: Maeola Harman, MD;  Location: [facility];  Service: Neurosurgery;  Laterality: Left;  Left Lumbar two-three Laminectomy for resection of synovial cyst  . SHOULDER OPEN ROTATOR CUFF REPAIR Right 2011  . TONSILLECTOMY    . TOTAL ABDOMINAL HYSTERECTOMY    .  TOTAL HIP ARTHROPLASTY Right 03/13/2013   Procedure: TOTAL HIP ARTHROPLASTY;  Surgeon: Nestor Lewandowsky, MD;  Location: [facility];  Service: Orthopedics;  Laterality: Right;    Social History   Social History  . Marital status: Widowed    Spouse name: N/A  . Number of children: 5  . Years of education: 8th grade   Occupational History  . 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 Part time care giver    Social History Main Topics    . Smoking status: Current Some Day Smoker    Packs/day: 0.10    Years: 60.00    Types: Cigarettes  . Smokeless tobacco: Never Used  . Alcohol use No  . Drug use: No  . Sexual activity: No   Other Topics Concern  . Not on file   Social History Narrative   Divorced, lives alone.   Right-handed.   1 cup coffee per day.    Allergies  Allergen Reactions  . Lisinopril-Hydrochlorothiazide Other (See Comments)    Lip swelling and cough. Stopped by ENT. Patient is on Losartan without any issues   . Codeine Itching and Swelling    eye irritation (prescribed percocet)  . Penicillins Itching and Swelling    Has patient had a PCN reaction causing immediate rash, facial/tongue/throat swelling, SOB or lightheadedness with hypotension: Yes Has patient had a PCN reaction causing severe rash involving mucus membranes or skin necrosis: No Has patient had a PCN reaction that required hospitalization No Has patient had a PCN reaction occurring within the last 10 years: No If all of the above answers are "NO", then may proceed with Cephalosporin use. eye irritation    Family History  Problem Relation Age of Onset  . Diabetes Father   . Diabetes Brother   . Cancer Brother 34    colon  . Diabetes Brother   . Diabetes Brother   . Hypertension Sister   . Diabetes Sister   . Diabetes Sister   . Diabetes Other   . Allergies Other   . Colon cancer Neg Hx   . Anesthesia problems Neg Hx   . Hypotension Neg Hx   . Malignant hyperthermia Neg Hx   . Pseudochol deficiency Neg Hx     Prior to Admission medications   Medication Sig Start Date End Date Taking? Authorizing Provider  albuterol (PROVENTIL HFA;VENTOLIN HFA) 108 (90 Base) MCG/ACT inhaler Inhale 2 puffs into the lungs every 6 (six) hours as needed for wheezing or shortness of breath. 01/09/16  Yes Elige Radon Dettinger, MD  aspirin EC 81 MG tablet Take 81 mg by mouth daily.   Yes Historical Provider, MD  azithromycin (ZITHROMAX) 250 MG  tablet Take 500 mg once, then 250 mg for four days 04/02/16  Yes Junie Spencer, FNP  budesonide-formoterol (SYMBICORT) 160-4.5 MCG/ACT inhaler Inhale 2 puffs into the lungs 2 (two) times daily.   Yes Historical Provider, MD  buPROPion (WELLBUTRIN SR) 150 MG 12 hr tablet Take 1 tablet (150 mg total) by mouth 2 (two) times daily. Patient taking differently: Take 150 mg by mouth daily.  10/13/15  Yes Junie Spencer, FNP  carvedilol (COREG) 6.25 MG tablet Take 1 tablet (6.25 mg total) by mouth 2 (two) times daily with a meal. 10/13/15  Yes Junie Spencer, FNP  cetirizine (ZYRTEC) 10 MG tablet Take 1 tablet (10 mg total) by mouth at bedtime. 03/19/16  Yes [name], MD  esomeprazole (  NEXIUM) 40 MG capsule Take 1 capsule (40 mg total) by mouth daily before breakfast. 10/13/15  Yes Christy A Hawks, FNP  fluticasone furoate-vilanterol (BREO ELLIPTA) 200-25 MCG/INH AEPB Inhale 1 puff into the lungs daily. 03/19/16  Yes [name], MD  ibuprofen (ADVIL,MOTRIN) 600 MG tablet Take 1 tablet (600 mg total) by mouth every 8 (eight) hours as needed. 12/08/15  Yes [name], MD  losartan (COZAAR) 50 MG tablet Take 0.5 tablets (25 mg total) by mouth daily. 10/13/15  Yes Christy A Hawks, FNP  mometasone (NASONEX) 50 MCG/ACT nasal spray Place 2 sprays into the nose daily. 03/12/16  Yes [name], MD  Multiple Vitamins-Minerals (CENTRUM SILVER PO) Take 1 tablet by mouth daily.   Yes Historical Provider, MD  ondansetron (ZOFRAN) 4 MG tablet Take 1 tablet every 8 hours as needed for nausea or vomiting. 02/07/16  Yes [name], MD  raloxifene (EVISTA) 60 MG tablet Take 1 tablet (60 mg total) by mouth daily. 10/13/15  Yes Junie Spencer, FNP  triamcinolone (KENALOG) 0.025 % ointment Apply 1 application topically 2 (two) times daily. 02/16/16  Yes [name], MD  vitamin B-12 (CYANOCOBALAMIN) 1000 MCG tablet Take 1,000 mcg by mouth daily.   Yes Historical Provider, MD    Physical  Exam: Vitals:   04/05/16 1915 04/05/16 1930 04/05/16 2000 04/05/16 2105  BP:  140/71 160/91 (!) 151/67  Pulse: 64 68 67 64  Resp: 15 14 13 20   Temp:    97.5 F (36.4 C)  TempSrc:    Oral  SpO2: 100% 99% 99% 96%  Weight:    72.2 kg (159 lb 2.8 oz)  Height:    5\' 4"  (1.626 m)     General:  Appears calm and comfortable and is NAD Eyes:  PERRL, EOMI, normal lids, iris ENT:  grossly normal hearing, lips & tongue, mmm Neck:  no LAD, masses or thyromegaly Cardiovascular:  RRR, no m/r/g. No LE edema.  Respiratory:  CTA bilaterally, no w/r/r. Normal respiratory effort. Abdomen:  soft, diffuse TTP worst in LLQ, nd, hypoactive BS Skin:  no rash or induration seen on limited exam Musculoskeletal:  grossly normal tone BUE/BLE, good ROM, no bony abnormality Psychiatric:  grossly normal mood and affect, speech fluent and appropriate, AOx3 Neurologic:  CN 2-12 grossly intact, moves all extremities in coordinated fashion, sensation intact  Labs on Admission: I have personally reviewed following labs and imaging studies  CBC:  Recent Labs Lab 04/05/16 1654  WBC 11.5*  NEUTROABS 9.2*  HGB 14.1  HCT 42.3  MCV 91.0  PLT 293   Basic Metabolic Panel:  Recent Labs Lab 04/05/16 1654  NA 141  K 4.0  CL 109  CO2 25  GLUCOSE 116*  BUN 21*  CREATININE 1.12*  CALCIUM 9.1   GFR: Estimated Creatinine Clearance: 40.3 mL/min (by C-G formula based on SCr of 1.12 mg/dL (H)). Liver Function Tests:  Recent Labs Lab 04/05/16 1654  AST 29  ALT 25  ALKPHOS 81  BILITOT 0.4  PROT 7.0  ALBUMIN 3.8    Recent Labs Lab 04/05/16 1654  LIPASE 19   No results for input(s): AMMONIA in the last 168 hours. Coagulation Profile: No results for input(s): INR, PROTIME in the last 168 hours. Cardiac Enzymes:  Recent Labs Lab 04/05/16 1654 04/05/16 2121  TROPONINI 0.04* 0.04*   BNP (last 3 results) No results for input(s): PROBNP in the last 8760 hours. HbA1C: No results for input(s):  HGBA1C  in the last 72 hours. CBG: No results for input(s): GLUCAP in the last 168 hours. Lipid Profile: No results for input(s): CHOL, HDL, LDLCALC, TRIG, CHOLHDL, LDLDIRECT in the last 72 hours. Thyroid Function Tests: No results for input(s): TSH, T4TOTAL, FREET4, T3FREE, THYROIDAB in the last 72 hours. Anemia Panel: No results for input(s): VITAMINB12, FOLATE, FERRITIN, TIBC, IRON, RETICCTPCT in the last 72 hours. Urine analysis:    Component Value Date/Time   COLORURINE YELLOW 04/05/2016 1654   APPEARANCEUR HAZY (A) 04/05/2016 1654   APPEARANCEUR Clear 10/28/2015 1622   LABSPEC 1.039 (H) 04/05/2016 1654   PHURINE 8.0 04/05/2016 1654   GLUCOSEU NEGATIVE 04/05/2016 1654   HGBUR NEGATIVE 04/05/2016 1654   BILIRUBINUR NEGATIVE 04/05/2016 1654   BILIRUBINUR Negative 10/28/2015 1622   KETONESUR NEGATIVE 04/05/2016 1654   PROTEINUR NEGATIVE 04/05/2016 1654   UROBILINOGEN 0.2 10/04/2013 1457   NITRITE NEGATIVE 04/05/2016 1654   LEUKOCYTESUR NEGATIVE 04/05/2016 1654   LEUKOCYTESUR Trace (A) 10/28/2015 1622    Creatinine Clearance: Estimated Creatinine Clearance: 40.3 mL/min (by C-G formula based on SCr of 1.12 mg/dL (H)).  Sepsis Labs: @LABRCNTIP (procalcitonin:4,lacticidven:4) )No results found for this or any previous visit (from the past 240 hour(s)).   Radiological Exams on Admission: Dg Chest 2 View  Result Date: 04/05/2016 CLINICAL DATA:  Nausea, vomiting, abdominal pain.  Productive cough. EXAM: CHEST  2 VIEW COMPARISON:  11/14/2015 FINDINGS: Stable mild cardiomegaly and mediastinal contours. There is thoracic aortic atherosclerosis. Bibasilar atelectasis without confluent airspace disease. No pulmonary edema or pleural fluid. No pneumothorax. No acute osseous abnormality. IMPRESSION: 1. Bibasilar atelectasis. 2. Stable mild cardiomegaly and thoracic aortic atherosclerosis. Electronically Signed   By: Rubye Oaks M.D.   On: 04/05/2016 18:24   Ct Abdomen Pelvis W  Contrast  Result Date: 04/05/2016 CLINICAL DATA:  Nausea, vomiting and abdominal pain since 11:30 a.m. today. History of appendectomy and hysterectomy. EXAM: CT ABDOMEN AND PELVIS WITH CONTRAST TECHNIQUE: Multidetector CT imaging of the abdomen and pelvis was performed using the standard protocol following bolus administration of intravenous contrast. CONTRAST:  ISOVUE-300 IOPAMIDOL (ISOVUE-300) INJECTION 61% COMPARISON:  10/04/2015 CT FINDINGS: Lower chest: Chronic scarring in the periphery of both lower lobes with dependent atelectasis. Borderline cardiomegaly without pericardial effusion. No pleural effusion or pneumothorax. Faint ground-glass opacities in the posterior aspect of both lower lobes may be secondary to mild left alveolitis or pneumonitis. There is a small hiatal hernia. Hepatobiliary: 7 mm rounded nonenhancing hypodensity in the left hepatic lobe is likely related to a cyst and is unchanged in appearance. The gallbladder is surgically absent. There is no biliary dilatation. Pancreas: Normal Spleen: Normal Adrenals/Urinary Tract: Unremarkable adrenal glands. The kidneys demonstrate no abnormal enhancement nor obstruction. Bladder is physiologically distended. No ureteral stones are seen. Stomach/Bowel: Mild diffuse fluid-filled distention of mid to distal ileal loops up to 2 cm may be secondary to an area stenosis seen involving the distal ileum noted on series 2, image 55 and 56. This could be postinflammatory in etiology. Small bowel neoplasm cannot be entirely excluded. No adjacent lymphadenopathy. The patient is status post appendectomy. There is a moderate amount of fecal retention throughout large bowel with scattered diverticulosis. Vascular/Lymphatic: Aortoiliac atherosclerosis without aneurysm. Reproductive: Hysterectomy.  No adnexal mass. Other:  No free air ascites.  Small fat containing umbilical hernia. Musculoskeletal: Right hip arthroplasty. L4-5 lumbar spinal fusion with  interbody block noted. Disc space narrowing at L5-S1. No suspicious osseous abnormalities. IMPRESSION: Mild fluid-filled distention of mid to distal ileum secondary to a  short segmental area of luminal narrowing involving the distal ileum. Findings may represent early or partial SBO secondary to a stricture. Neoplasm is not entirely excluded. Diffuse colonic diverticulosis without acute diverticulitis.  Bold Stable 7 mm hypodensity in the left hepatic lobe consistent with a cyst. Gallbladder surgically absent. Appendectomy and hysterectomy. Small fat containing umbilical hernia. Right hip arthroplasty as well as L4-5 lumbar spinal fusion. Faint ground-glass opacities in the lung bases likely related to atelectasis. Alveolitis/pneumonitis not entirely excluded. Electronically Signed   By: Tollie Eth M.D.   On: 04/05/2016 18:58    EKG: Independently reviewed.  NSR with rate 54; LBBB with no significant change since last tracing  Assessment/Plan Principal Problem:   SBO (small bowel obstruction) Active Problems:   Essential hypertension   COPD (chronic obstructive pulmonary disease) (HCC)   Dementia   Elevated troponin   Hyperglycemia   SBO -Patient with h/o 3 prior abdominal surgeries presenting with acute onset of abdominal pain and CT finding consistent with SBO -Patient without persistent n/v, so NG tube does not seem necessary at this time; if her symptoms are refractory, would suggest this -Will place patient in observation -Pain control with morphine -NPO -IVF -Nausea control with Zofran -If not improving in next 12-24 hours, may need further evaluation as a mass has not been ruled out -However, given her h/o multiple abdominal surgeries, adhesions may be the issue and this may improve with bowel rest and time -Currently no evidence of active infection (negative lactate, no fever) but she does have a mildly elevated WBC count (11.5) - will follow -Gen Surgery consult  requested  COPD -Continue Breo Ellipta, but she should not be on both Breo and Symbicort so the Symbicort was stopped -Currently without evidence of exacerbation -Uncertain why she has been taking Zithromax, but this has not be reordered  HTN -Continue Coreg, Cozaar  Dementia -Not evident on today's exam - patient was appropriate and able to answer questions -She is not on dementia medication -Continue Wellbutrin  Hyperglycemia -May be an acute stress response -Will follow fasting glucose on AM BMP  Elevated troponin -Troponin 0.04 today and consistently since 7/17 -Will trend but no c/o chest pain and no EKG changes so likely does not require intervention    DVT prophylaxis:  Lovenox Code Status: DNR - confirmed with patient/family Family Communication: Daughter present throughout evaluation  Disposition Plan:  Home once clinically improved Consults called: None  Admission status: It is my clinical opinion that referral for OBSERVATION is reasonable and necessary in this patient based on the above information provided. The aforementioned taken together are felt to place the patient at high risk for further clinical deterioration. However it is anticipated that the patient may be medically stable for discharge from the hospital within 24 to 48 hours.     Jonah Blue MD [facility]  If 7PM-7AM, please contact night-coverage [email or web address] Password [email or web address]  04/05/2016, 11:05 PM

## 2016-04-06 DIAGNOSIS — F0280 Dementia in other diseases classified elsewhere without behavioral disturbance: Secondary | ICD-10-CM | POA: Diagnosis present

## 2016-04-06 DIAGNOSIS — K5651 Intestinal adhesions [bands], with partial obstruction: Secondary | ICD-10-CM | POA: Diagnosis present

## 2016-04-06 DIAGNOSIS — Z66 Do not resuscitate: Secondary | ICD-10-CM | POA: Diagnosis present

## 2016-04-06 DIAGNOSIS — E782 Mixed hyperlipidemia: Secondary | ICD-10-CM | POA: Diagnosis present

## 2016-04-06 DIAGNOSIS — F419 Anxiety disorder, unspecified: Secondary | ICD-10-CM | POA: Diagnosis present

## 2016-04-06 DIAGNOSIS — R748 Abnormal levels of other serum enzymes: Secondary | ICD-10-CM | POA: Diagnosis present

## 2016-04-06 DIAGNOSIS — Z96641 Presence of right artificial hip joint: Secondary | ICD-10-CM | POA: Diagnosis present

## 2016-04-06 DIAGNOSIS — F17210 Nicotine dependence, cigarettes, uncomplicated: Secondary | ICD-10-CM | POA: Diagnosis present

## 2016-04-06 DIAGNOSIS — K56609 Unspecified intestinal obstruction, unspecified as to partial versus complete obstruction: Secondary | ICD-10-CM | POA: Diagnosis not present

## 2016-04-06 DIAGNOSIS — J449 Chronic obstructive pulmonary disease, unspecified: Secondary | ICD-10-CM | POA: Diagnosis present

## 2016-04-06 DIAGNOSIS — I429 Cardiomyopathy, unspecified: Secondary | ICD-10-CM | POA: Diagnosis present

## 2016-04-06 DIAGNOSIS — Z885 Allergy status to narcotic agent status: Secondary | ICD-10-CM | POA: Diagnosis not present

## 2016-04-06 DIAGNOSIS — Z880 Allergy status to penicillin: Secondary | ICD-10-CM | POA: Diagnosis not present

## 2016-04-06 DIAGNOSIS — I10 Essential (primary) hypertension: Secondary | ICD-10-CM | POA: Diagnosis not present

## 2016-04-06 DIAGNOSIS — I110 Hypertensive heart disease with heart failure: Secondary | ICD-10-CM | POA: Diagnosis present

## 2016-04-06 DIAGNOSIS — I447 Left bundle-branch block, unspecified: Secondary | ICD-10-CM | POA: Diagnosis present

## 2016-04-06 DIAGNOSIS — R739 Hyperglycemia, unspecified: Secondary | ICD-10-CM | POA: Diagnosis present

## 2016-04-06 DIAGNOSIS — M8580 Other specified disorders of bone density and structure, unspecified site: Secondary | ICD-10-CM | POA: Diagnosis present

## 2016-04-06 DIAGNOSIS — Z90710 Acquired absence of both cervix and uterus: Secondary | ICD-10-CM | POA: Diagnosis not present

## 2016-04-06 DIAGNOSIS — Z8249 Family history of ischemic heart disease and other diseases of the circulatory system: Secondary | ICD-10-CM | POA: Diagnosis not present

## 2016-04-06 DIAGNOSIS — K219 Gastro-esophageal reflux disease without esophagitis: Secondary | ICD-10-CM | POA: Diagnosis present

## 2016-04-06 DIAGNOSIS — K56691 Other complete intestinal obstruction: Secondary | ICD-10-CM | POA: Diagnosis not present

## 2016-04-06 DIAGNOSIS — I5022 Chronic systolic (congestive) heart failure: Secondary | ICD-10-CM | POA: Diagnosis present

## 2016-04-06 DIAGNOSIS — R109 Unspecified abdominal pain: Secondary | ICD-10-CM | POA: Diagnosis present

## 2016-04-06 DIAGNOSIS — M5481 Occipital neuralgia: Secondary | ICD-10-CM | POA: Diagnosis present

## 2016-04-06 DIAGNOSIS — Z833 Family history of diabetes mellitus: Secondary | ICD-10-CM | POA: Diagnosis not present

## 2016-04-06 DIAGNOSIS — G309 Alzheimer's disease, unspecified: Secondary | ICD-10-CM | POA: Diagnosis present

## 2016-04-06 DIAGNOSIS — F329 Major depressive disorder, single episode, unspecified: Secondary | ICD-10-CM | POA: Diagnosis present

## 2016-04-06 LAB — CBC
HCT: 38.2 % (ref 36.0–46.0)
HEMOGLOBIN: 12.6 g/dL (ref 12.0–15.0)
MCH: 30.1 pg (ref 26.0–34.0)
MCHC: 33 g/dL (ref 30.0–36.0)
MCV: 91.2 fL (ref 78.0–100.0)
PLATELETS: 277 10*3/uL (ref 150–400)
RBC: 4.19 MIL/uL (ref 3.87–5.11)
RDW: 15 % (ref 11.5–15.5)
WBC: 8.3 10*3/uL (ref 4.0–10.5)

## 2016-04-06 LAB — BASIC METABOLIC PANEL
ANION GAP: 5 (ref 5–15)
BUN: 14 mg/dL (ref 6–20)
CALCIUM: 8.1 mg/dL — AB (ref 8.9–10.3)
CO2: 26 mmol/L (ref 22–32)
CREATININE: 1 mg/dL (ref 0.44–1.00)
Chloride: 108 mmol/L (ref 101–111)
GFR, EST NON AFRICAN AMERICAN: 53 mL/min — AB (ref >60)
Glucose, Bld: 87 mg/dL (ref 65–99)
Potassium: 3.8 mmol/L (ref 3.5–5.1)
SODIUM: 139 mmol/L (ref 135–145)

## 2016-04-06 LAB — TROPONIN I
TROPONIN I: 0.04 ng/mL — AB (ref <0.03)
TROPONIN I: 0.04 ng/mL — AB (ref <0.03)

## 2016-04-06 MED ORDER — MILK AND MOLASSES ENEMA
1.0000 | Freq: Once | RECTAL | Status: AC
  Administered 2016-04-06: 250 mL via RECTAL

## 2016-04-06 MED ORDER — POTASSIUM CHLORIDE CRYS ER 20 MEQ PO TBCR
40.0000 meq | EXTENDED_RELEASE_TABLET | Freq: Once | ORAL | Status: AC
  Administered 2016-04-06: 40 meq via ORAL
  Filled 2016-04-06: qty 2

## 2016-04-06 NOTE — Care Management Note (Signed)
Case Management Note  Patient Details  Name: [name] MRN: [id number] Date of Birth: 08/11/1934  Subjective/Objective:    Patient adm from home, son lives with her. She is ind with ADL's. Has cane and walker PTA. Has a PCP, transportation, and insurance with prescription coverage.  No home health services.           Action/Plan: Anticipate DC with self care.    Expected Discharge Date:     04/07/2016          Expected Discharge Plan:  Home/Self Care  In-House Referral:  NA  Discharge planning Services  CM Consult  Post Acute Care Choice:  NA Choice offered to:  NA  DME Arranged:    DME Agency:     HH Arranged:    HH Agency:     Status of Service:  Completed, signed off  If discussed at [employer] of Stay Meetings, dates discussed:    Additional Comments:  [name], [name] Oiler, RN 04/06/2016, 3:27 PM

## 2016-04-06 NOTE — Progress Notes (Addendum)
PROGRESS NOTE                                                                                                                                                                                                             Patient Demographics:    [name], is a 81 y.o. female, DOB - [birth year], ZOX:[id number]  Admit date - 04/05/2016   Admitting Physician Jonah BlueJennifer Yates, MD  Outpatient Primary MD for the patient is Johna Sheriffarol L Vincent, MD  LOS - 0  Outpatient Specialists:None  Chief Complaint  Patient presents with  . Weakness  . Abdominal Pain  . Emesis       Brief Narrative   81 year old female with a potential, hyperlipidemia with prior bowel surgeries presented with acute onset of abdominal pain, diaphoresis with pain and more pronounced in the left lower quadrant. In the ED a CT of the abdomen done showed mild fluid-filled distention of mid to distal ileum suggestive of early versus partial small bowel obstruction. Placed on observation. Surgery following.   Subjective:    Denies further abdominal pain, nausea or vomiting. Still has not passed gas or had a bowel movement.   Assessment  & Plan :    Principal Problem:   Partial SBO (small bowel obstruction) -Likely secondary to adhesions and increased stool burden.. Managing conservative late with bowel rest, IV hydration and serial abdominal exam. Pain has subsided and does not have any nausea or vomiting. Monitor electrolytes, keep k >4. Has good bowel sounds on exam but has not passed flatus or had a bowel movement. Surgery consult appreciated. Given moderate stool burden ordered for an enema. -Recheck abdominal x-ray in the morning.  Active Problems:   Essential hypertension Stable. Continue home medications    COPD (chronic obstructive pulmonary disease) (HCC) Able. Continue home inhalers.    Dementia ? Mild. Stable and oriented on  evaluation.  Elevated troponin Mild, peaked at 0.04. No EKG changes and no chest pain symptoms.       Code Status : DO NOT RESUSCITATE  Family Communication  : None at bedside  Disposition Plan  : Home once symptoms resolved  Barriers For Discharge : Active symptoms  Consults  :  surgery (Dr. Lovell SheehanJenkins)  Procedures  :  CT abdomen and pelvis  DVT Prophylaxis  :  Lovenox -  Lab Results  Component Value Date   PLT 277 04/06/2016    Antibiotics  :    Anti-infectives    None        Objective:   Vitals:   04/05/16 2000 04/05/16 2105 04/06/16 0413 04/06/16 1034  BP: 160/91 (!) 151/67 (!) 155/59   Pulse: 67 64 60   Resp: 13 20 16    Temp:  97.5 F (36.4 C) 97.7 F (36.5 C)   TempSrc:  Oral Oral   SpO2: 99% 96% 100% 96%  Weight:  72.2 kg (159 lb 2.8 oz)    Height:  5\' 4"  (1.626 m)      Wt Readings from Last 3 Encounters:  04/05/16 72.2 kg (159 lb 2.8 oz)  04/02/16 71.7 kg (158 lb)  03/19/16 72.1 kg (159 lb)     Intake/Output Summary (Last 24 hours) at 04/06/16 1336 Last data filed at 04/06/16 0300  Gross per 24 hour  Intake              820 ml  Output              200 ml  Net              620 ml     Physical Exam  Gen: not in distress HEENT:  moist mucosa, supple neck Chest: clear b/l, no added sounds CVS: N S1&S2, no murmurs, GI: soft, NT, ND, BS+ Musculoskeletal: warm, no edema     Data Review:    CBC  Recent Labs Lab 04/05/16 1654 04/06/16 0252  WBC 11.5* 8.3  HGB 14.1 12.6  HCT 42.3 38.2  PLT 293 277  MCV 91.0 91.2  MCH 30.3 30.1  MCHC 33.3 33.0  RDW 15.0 15.0  LYMPHSABS 1.8  --   MONOABS 0.3  --   EOSABS 0.1  --   BASOSABS 0.0  --     Chemistries   Recent Labs Lab 04/05/16 1654 04/06/16 0252  NA 141 139  K 4.0 3.8  CL 109 108  CO2 25 26  GLUCOSE 116* 87  BUN 21* 14  CREATININE 1.12* 1.00  CALCIUM 9.1 8.1*  AST 29  --   ALT 25  --   ALKPHOS 81  --   BILITOT 0.4  --     ------------------------------------------------------------------------------------------------------------------ No results for input(s): CHOL, HDL, LDLCALC, TRIG, CHOLHDL, LDLDIRECT in the last 72 hours.  Lab Results  Component Value Date   HGBA1C 5.8 (H) 10/04/2013   ------------------------------------------------------------------------------------------------------------------ No results for input(s): TSH, T4TOTAL, T3FREE, THYROIDAB in the last 72 hours.  Invalid input(s): FREET3 ------------------------------------------------------------------------------------------------------------------ No results for input(s): VITAMINB12, FOLATE, FERRITIN, TIBC, IRON, RETICCTPCT in the last 72 hours.  Coagulation profile No results for input(s): INR, PROTIME in the last 168 hours.  No results for input(s): DDIMER in the last 72 hours.  Cardiac Enzymes  Recent Labs Lab 04/05/16 2121 04/06/16 0252 04/06/16 0911  TROPONINI 0.04* 0.04* 0.04*   ------------------------------------------------------------------------------------------------------------------    Component Value Date/Time   BNP 166.2 (H) 02/21/2015 1833    Inpatient Medications  Scheduled Meds: . aspirin EC  81 mg Oral Daily  . buPROPion  150 mg Oral Daily  . carvedilol  6.25 mg Oral BID WC  . enoxaparin (LOVENOX) injection  40 mg Subcutaneous Q24H  . fluticasone  2 spray Each Nare Daily  . fluticasone furoate-vilanterol  1 puff Inhalation Daily  . loratadine  10 mg Oral Daily  . losartan  25 mg Oral Daily  . pantoprazole  80 mg  Oral Daily  . raloxifene  60 mg Oral Daily   Continuous Infusions: . sodium chloride 75 mL/hr at 04/06/16 1005   PRN Meds:.acetaminophen **OR** acetaminophen, albuterol, morphine injection, ondansetron **OR** ondansetron (ZOFRAN) IV  Micro Results No results found for this or any previous visit (from the past 240 hour(s)).  Radiology Reports Dg Chest 2 View  Result Date:  04/05/2016 CLINICAL DATA:  Nausea, vomiting, abdominal pain.  Productive cough. EXAM: CHEST  2 VIEW COMPARISON:  11/14/2015 FINDINGS: Stable mild cardiomegaly and mediastinal contours. There is thoracic aortic atherosclerosis. Bibasilar atelectasis without confluent airspace disease. No pulmonary edema or pleural fluid. No pneumothorax. No acute osseous abnormality. IMPRESSION: 1. Bibasilar atelectasis. 2. Stable mild cardiomegaly and thoracic aortic atherosclerosis. Electronically Signed   By: Rubye Oaks M.D.   On: 04/05/2016 18:24   Ct Abdomen Pelvis W Contrast  Result Date: 04/05/2016 CLINICAL DATA:  Nausea, vomiting and abdominal pain since 11:30 a.m. today. History of appendectomy and hysterectomy. EXAM: CT ABDOMEN AND PELVIS WITH CONTRAST TECHNIQUE: Multidetector CT imaging of the abdomen and pelvis was performed using the standard protocol following bolus administration of intravenous contrast. CONTRAST:  ISOVUE-300 IOPAMIDOL (ISOVUE-300) INJECTION 61% COMPARISON:  10/04/2015 CT FINDINGS: Lower chest: Chronic scarring in the periphery of both lower lobes with dependent atelectasis. Borderline cardiomegaly without pericardial effusion. No pleural effusion or pneumothorax. Faint ground-glass opacities in the posterior aspect of both lower lobes may be secondary to mild left alveolitis or pneumonitis. There is a small hiatal hernia. Hepatobiliary: 7 mm rounded nonenhancing hypodensity in the left hepatic lobe is likely related to a cyst and is unchanged in appearance. The gallbladder is surgically absent. There is no biliary dilatation. Pancreas: Normal Spleen: Normal Adrenals/Urinary Tract: Unremarkable adrenal glands. The kidneys demonstrate no abnormal enhancement nor obstruction. Bladder is physiologically distended. No ureteral stones are seen. Stomach/Bowel: Mild diffuse fluid-filled distention of mid to distal ileal loops up to 2 cm may be secondary to an area stenosis seen involving the  distal ileum noted on series 2, image 55 and 56. This could be postinflammatory in etiology. Small bowel neoplasm cannot be entirely excluded. No adjacent lymphadenopathy. The patient is status post appendectomy. There is a moderate amount of fecal retention throughout large bowel with scattered diverticulosis. Vascular/Lymphatic: Aortoiliac atherosclerosis without aneurysm. Reproductive: Hysterectomy.  No adnexal mass. Other:  No free air ascites.  Small fat containing umbilical hernia. Musculoskeletal: Right hip arthroplasty. L4-5 lumbar spinal fusion with interbody block noted. Disc space narrowing at L5-S1. No suspicious osseous abnormalities. IMPRESSION: Mild fluid-filled distention of mid to distal ileum secondary to a short segmental area of luminal narrowing involving the distal ileum. Findings may represent early or partial SBO secondary to a stricture. Neoplasm is not entirely excluded. Diffuse colonic diverticulosis without acute diverticulitis.  Bold Stable 7 mm hypodensity in the left hepatic lobe consistent with a cyst. Gallbladder surgically absent. Appendectomy and hysterectomy. Small fat containing umbilical hernia. Right hip arthroplasty as well as L4-5 lumbar spinal fusion. Faint ground-glass opacities in the lung bases likely related to atelectasis. Alveolitis/pneumonitis not entirely excluded. Electronically Signed   By: Tollie Eth M.D.   On: 04/05/2016 18:58    Time Spent in minutes  25   Eddie North M.D on 04/06/2016 at 1:36 PM  Between 7am to 7pm - Pager - [phone]  After 7pm go to [email or web address] - password [facility]  [facility] -  Office  [phone]

## 2016-04-06 NOTE — Consult Note (Signed)
Reason for Consult: Small bowel obstruction Referring Physician: Dr. Ronni [name] is an 81 y.o. female.  HPI: Patient is a 81 year old black female who presented emergency room with abdominal pain. She felt sick to her stomach and presented emergency room with nausea and vomiting. CT scan the abdomen revealed a partial small bowel obstruction, thus the patient was admitted by the hospitalists for further evaluation treatment. She states this morning that her abdomen does not hurt. She has not had a bowel movement since she was admitted. She states she is a little nauseated, but has not had no emesis. History of sore limited secondary to patient's history of memory loss.  Past Medical History:  Diagnosis Date  . Allergic rhinitis   . Anxiety   . Arthritis   . Cataract    had eye surgery  . Depression   . Essential hypertension, benign   . GERD (gastroesophageal reflux disease)   . History of blood transfusion   . Left bundle branch block   . Memory loss   . Mixed hyperlipidemia   . Occipital neuralgia of left side   . Osteopenia   . 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 Urinary urgency     Past Surgical History:  Procedure Laterality Date  . APPENDECTOMY    . BACK SURGERY    . CARDIAC CATHETERIZATION  2010   Normal coronaries  . CATARACT EXTRACTION W/PHACO  04/16/2011   Procedure: CATARACT EXTRACTION PHACO AND INTRAOCULAR LENS PLACEMENT (IOC);  Surgeon: Williams Che;  Location: [facility];  Service: Ophthalmology;  Laterality: Left;  CDE=11.35  . CATARACT EXTRACTION W/PHACO  01/28/2012   Procedure: CATARACT EXTRACTION PHACO AND INTRAOCULAR LENS PLACEMENT (IOC);  Surgeon: Williams Che, MD;  Location: [facility];  Service: Ophthalmology;  Laterality: Right;  CDI:8.15  . CYSTO WITH HYDRODISTENSION  05/29/2012   Procedure: CYSTOSCOPY/HYDRODISTENSION;  Surgeon: Reece Packer, MD;  Location: [facility];  Service: Urology;   Laterality: N/A;  INSTILLATION OF MARCAINE AND PYRIDIUM   . DILATION AND CURETTAGE OF UTERUS  ?<hysterectomy"  . HAMMER TOE SURGERY Bilateral 11/22/11   [place], [place] Shows  . JOINT REPLACEMENT    . LAPAROSCOPIC CHOLECYSTECTOMY  1991  . LUMBAR LAMINECTOMY  2011  . LUMBAR LAMINECTOMY/DECOMPRESSION MICRODISCECTOMY Left 06/06/2012   Procedure: LUMBAR LAMINECTOMY/DECOMPRESSION MICRODISCECTOMY 1 LEVEL;  Surgeon: Erline Levine, MD;  Location: [place] [facility] [facility];  Service: Neurosurgery;  Laterality: Left;  Left Lumbar two-three Laminectomy for resection of synovial cyst  . SHOULDER OPEN ROTATOR CUFF REPAIR Right 2011  . TONSILLECTOMY    . TOTAL ABDOMINAL HYSTERECTOMY    . TOTAL HIP ARTHROPLASTY Right 03/13/2013   Procedure: TOTAL HIP ARTHROPLASTY;  Surgeon: Kerin Salen, MD;  Location: [place];  Service: Orthopedics;  Laterality: Right;    Family History  Problem Relation Age of Onset  . Diabetes Father   . Diabetes Brother   . Cancer Brother 48    colon  . Diabetes Brother   . Diabetes Brother   . Hypertension Sister   . Diabetes Sister   . Diabetes Sister   . Diabetes Other   . Allergies Other   . Colon cancer Neg Hx   . Anesthesia problems Neg Hx   . Hypotension Neg Hx   . Malignant hyperthermia Neg Hx   . Pseudochol deficiency Neg Hx     Social History:  reports that she has been smoking Cigarettes.  She has a 6.00 pack-year  smoking history. She has never used smokeless tobacco. She reports that she does not drink alcohol or use drugs.  Allergies:  Allergies  Allergen Reactions  . Lisinopril-Hydrochlorothiazide Other (See Comments)    Lip swelling and cough. Stopped by ENT. Patient is on Losartan without any issues   . Codeine Itching and Swelling    eye irritation (prescribed percocet)  . Penicillins Itching and Swelling    Has patient had a PCN reaction causing immediate rash, facial/tongue/throat swelling, SOB or lightheadedness with hypotension: Yes Has patient had a PCN reaction causing  severe rash involving mucus membranes or skin necrosis: No Has patient had a PCN reaction that required hospitalization No Has patient had a PCN reaction occurring within the last 10 years: No If all of the above answers are "NO", then may proceed with Cephalosporin use. eye irritation    Medications:  Prior to Admission:  Prescriptions Prior to Admission  Medication Sig Dispense Refill Last Dose  . albuterol (PROVENTIL HFA;VENTOLIN HFA) 108 (90 Base) MCG/ACT inhaler Inhale 2 puffs into the lungs every 6 (six) hours as needed for wheezing or shortness of breath. 1 Inhaler 0 Taking  . aspirin EC 81 MG tablet Take 81 mg by mouth daily.   04/05/2016 at Unknown time  . azithromycin (ZITHROMAX) 250 MG tablet Take 500 mg once, then 250 mg for four days 6 tablet 0 04/05/2016 at Unknown time  . buPROPion (WELLBUTRIN SR) 150 MG 12 hr tablet Take 1 tablet (150 mg total) by mouth 2 (two) times daily. (Patient taking differently: Take 150 mg by mouth daily. ) 180 tablet 1 04/05/2016 at Unknown time  . carvedilol (COREG) 6.25 MG tablet Take 1 tablet (6.25 mg total) by mouth 2 (two) times daily with a meal. 60 tablet 6 04/05/2016 at Unknown time  . cetirizine (ZYRTEC) 10 MG tablet Take 1 tablet (10 mg total) by mouth at bedtime. 90 tablet 3 04/05/2016 at Unknown time  . esomeprazole (NEXIUM) 40 MG capsule Take 1 capsule (40 mg total) by mouth daily before breakfast. 90 capsule 1 Taking  . fluticasone furoate-vilanterol (BREO ELLIPTA) 200-25 MCG/INH AEPB Inhale 1 puff into the lungs daily. 30 each 3 04/04/2016 at Unknown time  . ibuprofen (ADVIL,MOTRIN) 600 MG tablet Take 1 tablet (600 mg total) by mouth every 8 (eight) hours as needed. 30 tablet 0 Taking  . losartan (COZAAR) 50 MG tablet Take 0.5 tablets (25 mg total) by mouth daily. 90 tablet 1 04/05/2016 at Unknown time  . mometasone (NASONEX) 50 MCG/ACT nasal spray Place 2 sprays into the nose daily. 17 g 12 04/04/2016 at Unknown time  . Multiple  Vitamins-Minerals (CENTRUM SILVER PO) Take 1 tablet by mouth daily.   04/05/2016 at Unknown time  . ondansetron (ZOFRAN) 4 MG tablet Take 1 tablet every 8 hours as needed for nausea or vomiting. 20 tablet 0 04/05/2016 at Unknown time  . raloxifene (EVISTA) 60 MG tablet Take 1 tablet (60 mg total) by mouth daily. 90 tablet 1 04/05/2016 at Unknown time  . triamcinolone (KENALOG) 0.025 % ointment Apply 1 application topically 2 (two) times daily. 30 g 0 Taking  . vitamin B-12 (CYANOCOBALAMIN) 1000 MCG tablet Take 1,000 mcg by mouth daily.   04/05/2016 at Unknown time   Scheduled: . aspirin EC  81 mg Oral Daily  . buPROPion  150 mg Oral Daily  . carvedilol  6.25 mg Oral BID WC  . enoxaparin (LOVENOX) injection  40 mg Subcutaneous Q24H  . fluticasone  2 spray  Each Nare Daily  . fluticasone furoate-vilanterol  1 puff Inhalation Daily  . loratadine  10 mg Oral Daily  . losartan  25 mg Oral Daily  . pantoprazole  80 mg Oral Daily  . raloxifene  60 mg Oral Daily    Results for orders placed or performed during the hospital encounter of 04/05/16 (from the past 48 hour(s))  Comprehensive metabolic panel     Status: Abnormal   Collection Time: 04/05/16  4:54 PM  Result Value Ref Range   Sodium 141 135 - 145 mmol/L   Potassium 4.0 3.5 - 5.1 mmol/L   Chloride 109 101 - 111 mmol/L   CO2 25 22 - 32 mmol/L   Glucose, Bld 116 (H) 65 - 99 mg/dL   BUN 21 (H) 6 - 20 mg/dL   Creatinine, Ser 1.12 (H) 0.44 - 1.00 mg/dL   Calcium 9.1 8.9 - 10.3 mg/dL   Total Protein 7.0 6.5 - 8.1 g/dL   Albumin 3.8 3.5 - 5.0 g/dL   AST 29 15 - 41 U/L   ALT 25 14 - 54 U/L   Alkaline Phosphatase 81 38 - 126 U/L   Total Bilirubin 0.4 0.3 - 1.2 mg/dL   GFR calc non Af Amer 46 (L) >60 mL/min   GFR calc Af Amer 53 (L) >60 mL/min    Comment: (NOTE) The eGFR has been calculated using the CKD EPI equation. This calculation has not been validated in all clinical situations. eGFR's persistently <60 mL/min signify possible Chronic  Kidney Disease.    Anion gap 7 5 - 15  Lipase, blood     Status: None   Collection Time: 04/05/16  4:54 PM  Result Value Ref Range   Lipase 19 11 - 51 U/L  Lactic acid, plasma     Status: None   Collection Time: 04/05/16  4:54 PM  Result Value Ref Range   Lactic Acid, Venous 1.5 0.5 - 1.9 mmol/L  CBC with Differential     Status: Abnormal   Collection Time: 04/05/16  4:54 PM  Result Value Ref Range   WBC 11.5 (H) 4.0 - 10.5 K/uL   RBC 4.65 3.87 - 5.11 MIL/uL   Hemoglobin 14.1 12.0 - 15.0 g/dL   HCT 42.3 36.0 - 46.0 %   MCV 91.0 78.0 - 100.0 fL   MCH 30.3 26.0 - 34.0 pg   MCHC 33.3 30.0 - 36.0 g/dL   RDW 15.0 11.5 - 15.5 %   Platelets 293 150 - 400 K/uL   Neutrophils Relative % 80 %   Neutro Abs 9.2 (H) 1.7 - 7.7 K/uL   Lymphocytes Relative 16 %   Lymphs Abs 1.8 0.7 - 4.0 K/uL   Monocytes Relative 3 %   Monocytes Absolute 0.3 0.1 - 1.0 K/uL   Eosinophils Relative 1 %   Eosinophils Absolute 0.1 0.0 - 0.7 K/uL   Basophils Relative 0 %   Basophils Absolute 0.0 0.0 - 0.1 K/uL  Urinalysis, Routine w reflex microscopic     Status: Abnormal   Collection Time: 04/05/16  4:54 PM  Result Value Ref Range   Color, Urine YELLOW YELLOW   APPearance HAZY (A) CLEAR   Specific Gravity, Urine 1.039 (H) 1.005 - 1.030   pH 8.0 5.0 - 8.0   Glucose, UA NEGATIVE NEGATIVE mg/dL   Hgb urine dipstick NEGATIVE NEGATIVE   Bilirubin Urine NEGATIVE NEGATIVE   Ketones, ur NEGATIVE NEGATIVE mg/dL   Protein, ur NEGATIVE NEGATIVE mg/dL   Nitrite NEGATIVE  NEGATIVE   Leukocytes, UA NEGATIVE NEGATIVE  Troponin I     Status: Abnormal   Collection Time: 04/05/16  4:54 PM  Result Value Ref Range   Troponin I 0.04 (HH) <0.03 ng/mL    Comment: CRITICAL RESULT CALLED TO, READ BACK BY AND VERIFIED WITH: LASHLEY,S ON 04/05/16 AT 1830 BY LOY,C   Troponin I     Status: Abnormal   Collection Time: 04/05/16  9:21 PM  Result Value Ref Range   Troponin I 0.04 (HH) <0.03 ng/mL    Comment: CRITICAL VALUE NOTED.   VALUE IS CONSISTENT WITH PREVIOUSLY REPORTED AND CALLED VALUE.  Basic metabolic panel     Status: Abnormal   Collection Time: 04/06/16  2:52 AM  Result Value Ref Range   Sodium 139 135 - 145 mmol/L   Potassium 3.8 3.5 - 5.1 mmol/L   Chloride 108 101 - 111 mmol/L   CO2 26 22 - 32 mmol/L   Glucose, Bld 87 65 - 99 mg/dL   BUN 14 6 - 20 mg/dL   Creatinine, Ser 1.00 0.44 - 1.00 mg/dL   Calcium 8.1 (L) 8.9 - 10.3 mg/dL   GFR calc non Af Amer 53 (L) >60 mL/min   GFR calc Af Amer >60 >60 mL/min    Comment: (NOTE) The eGFR has been calculated using the CKD EPI equation. This calculation has not been validated in all clinical situations. eGFR's persistently <60 mL/min signify possible Chronic Kidney Disease.    Anion gap 5 5 - 15  CBC     Status: None   Collection Time: 04/06/16  2:52 AM  Result Value Ref Range   WBC 8.3 4.0 - 10.5 K/uL   RBC 4.19 3.87 - 5.11 MIL/uL   Hemoglobin 12.6 12.0 - 15.0 g/dL   HCT 38.2 36.0 - 46.0 %   MCV 91.2 78.0 - 100.0 fL   MCH 30.1 26.0 - 34.0 pg   MCHC 33.0 30.0 - 36.0 g/dL   RDW 15.0 11.5 - 15.5 %   Platelets 277 150 - 400 K/uL  Troponin I     Status: Abnormal   Collection Time: 04/06/16  2:52 AM  Result Value Ref Range   Troponin I 0.04 (HH) <0.03 ng/mL    Comment: CRITICAL VALUE NOTED.  VALUE IS CONSISTENT WITH PREVIOUSLY REPORTED AND CALLED VALUE.    Dg Chest 2 View  Result Date: 04/05/2016 CLINICAL DATA:  Nausea, vomiting, abdominal pain.  Productive cough. EXAM: CHEST  2 VIEW COMPARISON:  11/14/2015 FINDINGS: Stable mild cardiomegaly and mediastinal contours. There is thoracic aortic atherosclerosis. Bibasilar atelectasis without confluent airspace disease. No pulmonary edema or pleural fluid. No pneumothorax. No acute osseous abnormality. IMPRESSION: 1. Bibasilar atelectasis. 2. Stable mild cardiomegaly and thoracic aortic atherosclerosis. Electronically Signed   By: Jeb Levering M.D.   On: 04/05/2016 18:24   Ct Abdomen Pelvis W  Contrast  Result Date: 04/05/2016 CLINICAL DATA:  Nausea, vomiting and abdominal pain since 11:30 a.m. today. History of appendectomy and hysterectomy. EXAM: CT ABDOMEN AND PELVIS WITH CONTRAST TECHNIQUE: Multidetector CT imaging of the abdomen and pelvis was performed using the standard protocol following bolus administration of intravenous contrast. CONTRAST:  172m ISOVUE-300 IOPAMIDOL (ISOVUE-300) INJECTION 61% COMPARISON:  10/04/2015 CT FINDINGS: Lower chest: Chronic scarring in the periphery of both lower lobes with dependent atelectasis. Borderline cardiomegaly without pericardial effusion. No pleural effusion or pneumothorax. Faint ground-glass opacities in the posterior aspect of both lower lobes may be secondary to mild left alveolitis or pneumonitis. There  is a small hiatal hernia. Hepatobiliary: 7 mm rounded nonenhancing hypodensity in the left hepatic lobe is likely related to a cyst and is unchanged in appearance. The gallbladder is surgically absent. There is no biliary dilatation. Pancreas: Normal Spleen: Normal Adrenals/Urinary Tract: Unremarkable adrenal glands. The kidneys demonstrate no abnormal enhancement nor obstruction. Bladder is physiologically distended. No ureteral stones are seen. Stomach/Bowel: Mild diffuse fluid-filled distention of mid to distal ileal loops up to 2 cm may be secondary to an area stenosis seen involving the distal ileum noted on series 2, image 55 and 56. This could be postinflammatory in etiology. Small bowel neoplasm cannot be entirely excluded. No adjacent lymphadenopathy. The patient is status post appendectomy. There is a moderate amount of fecal retention throughout large bowel with scattered diverticulosis. Vascular/Lymphatic: Aortoiliac atherosclerosis without aneurysm. Reproductive: Hysterectomy.  No adnexal mass. Other:  No free air ascites.  Small fat containing umbilical hernia. Musculoskeletal: Right hip arthroplasty. L4-5 lumbar spinal fusion with  interbody block noted. Disc space narrowing at L5-S1. No suspicious osseous abnormalities. IMPRESSION: Mild fluid-filled distention of mid to distal ileum secondary to a short segmental area of luminal narrowing involving the distal ileum. Findings may represent early or partial SBO secondary to a stricture. Neoplasm is not entirely excluded. Diffuse colonic diverticulosis without acute diverticulitis.  Bold Stable 7 mm hypodensity in the left hepatic lobe consistent with a cyst. Gallbladder surgically absent. Appendectomy and hysterectomy. Small fat containing umbilical hernia. Right hip arthroplasty as well as L4-5 lumbar spinal fusion. Faint ground-glass opacities in the lung bases likely related to atelectasis. Alveolitis/pneumonitis not entirely excluded. Electronically Signed   By: Ashley Royalty M.D.   On: 04/05/2016 18:58    ROS:  Review of systems not obtained due to patient factors.  Blood pressure (!) 155/59, pulse 60, temperature 97.7 F (36.5 C), temperature source Oral, resp. rate 16, height _0  (1.626 m), weight 72.2 kg (159 lb 2.8 oz), SpO2 100 %. Physical Exam: Pleasant black female in no acute distress. Head is normocephalic, atraumatic. Neck is supple without bruits Lungs clear to auscultation with breath sounds bilaterally. Heart examination reveals a regular rate and rhythm without a significant, S4, murmurs. The abdomen is soft with no slightly distended. No bowel sounds were appreciated. No hepatosplenomegaly, masses, or hernias are identified. CT scan images personally reviewed. H&P reviewed.  Assessment/Plan: Impression: Partial small bowel obstruction secondary to adhesive disease. Diverticulosis with moderate amount of stool present. Patient currently does not have an NG tube, which is fine with me. She does not appear to be acutely ill at this time. No need for acute surgical intervention at this time. Patient may have ice chips, with advancement of her diet as tolerated.  I will write her for an enema.  [name] A 04/06/2016, 8:35 AM

## 2016-04-06 NOTE — Care Management Obs Status (Signed)
MEDICARE OBSERVATION STATUS NOTIFICATION   Patient Details  Name: [name] MRN: [id number] Date of Birth: Dec 26, 1934   Medicare Observation Status Notification Given:  Yes    Shawnae Leiva, Chrystine Oiler, RN 04/06/2016, 3:27 PM

## 2016-04-07 ENCOUNTER — Inpatient Hospital Stay (HOSPITAL_COMMUNITY): Payer: Medicare Other

## 2016-04-07 DIAGNOSIS — I5022 Chronic systolic (congestive) heart failure: Secondary | ICD-10-CM

## 2016-04-07 DIAGNOSIS — I5032 Chronic diastolic (congestive) heart failure: Secondary | ICD-10-CM

## 2016-04-07 DIAGNOSIS — K56609 Unspecified intestinal obstruction, unspecified as to partial versus complete obstruction: Secondary | ICD-10-CM

## 2016-04-07 LAB — BASIC METABOLIC PANEL
ANION GAP: 10 (ref 5–15)
BUN: 12 mg/dL (ref 6–20)
CALCIUM: 8.4 mg/dL — AB (ref 8.9–10.3)
CO2: 22 mmol/L (ref 22–32)
CREATININE: 1.05 mg/dL — AB (ref 0.44–1.00)
Chloride: 108 mmol/L (ref 101–111)
GFR, EST AFRICAN AMERICAN: 57 mL/min — AB (ref >60)
GFR, EST NON AFRICAN AMERICAN: 50 mL/min — AB (ref >60)
Glucose, Bld: 56 mg/dL — ABNORMAL LOW (ref 65–99)
Potassium: 3.9 mmol/L (ref 3.5–5.1)
Sodium: 140 mmol/L (ref 135–145)

## 2016-04-07 LAB — URINE CULTURE

## 2016-04-07 NOTE — Progress Notes (Signed)
Subjective: Patient denies any nausea or vomiting. She is passing gas. She denies any abdominal pain.  Objective: Vital signs in last 24 hours: Temp:  [97.7 F (36.5 C)-98.4 F (36.9 C)] 97.7 F (36.5 C) (12/09 0400) Pulse Rate:  [52-66] 52 (12/09 0400) Resp:  [18] 18 (12/09 0400) BP: (147-155)/(61-62) 147/62 (12/09 0400) SpO2:  [91 %-95 %] 91 % (12/09 0736) Last BM Date: 04/05/16  Intake/Output from previous day: No intake/output data recorded. Intake/Output this shift: No intake/output data recorded.  General appearance: alert, cooperative and no distress GI: soft, non-tender; bowel sounds normal; no masses,  no organomegaly  Lab Results:   Recent Labs  04/05/16 1654 04/06/16 0252  WBC 11.5* 8.3  HGB 14.1 12.6  HCT 42.3 38.2  PLT 293 277   BMET  Recent Labs  04/06/16 0252 04/07/16 0607  NA 139 140  K 3.8 3.9  CL 108 108  CO2 26 22  GLUCOSE 87 56*  BUN 14 12  CREATININE 1.00 1.05*  CALCIUM 8.1* 8.4*   PT/INR No results for input(s): LABPROT, INR in the last 72 hours.  Studies/Results: Dg Chest 2 View  Result Date: 04/05/2016 CLINICAL DATA:  Nausea, vomiting, abdominal pain.  Productive cough. EXAM: CHEST  2 VIEW COMPARISON:  11/14/2015 FINDINGS: Stable mild cardiomegaly and mediastinal contours. There is thoracic aortic atherosclerosis. Bibasilar atelectasis without confluent airspace disease. No pulmonary edema or pleural fluid. No pneumothorax. No acute osseous abnormality. IMPRESSION: 1. Bibasilar atelectasis. 2. Stable mild cardiomegaly and thoracic aortic atherosclerosis. Electronically Signed   By: Rubye Oaks M.D.   On: 04/05/2016 18:24   Ct Abdomen Pelvis W Contrast  Result Date: 04/05/2016 CLINICAL DATA:  Nausea, vomiting and abdominal pain since 11:30 a.m. today. History of appendectomy and hysterectomy. EXAM: CT ABDOMEN AND PELVIS WITH CONTRAST TECHNIQUE: Multidetector CT imaging of the abdomen and pelvis was performed using the standard  protocol following bolus administration of intravenous contrast. CONTRAST:  ISOVUE-300 IOPAMIDOL (ISOVUE-300) INJECTION 61% COMPARISON:  10/04/2015 CT FINDINGS: Lower chest: Chronic scarring in the periphery of both lower lobes with dependent atelectasis. Borderline cardiomegaly without pericardial effusion. No pleural effusion or pneumothorax. Faint ground-glass opacities in the posterior aspect of both lower lobes may be secondary to mild left alveolitis or pneumonitis. There is a small hiatal hernia. Hepatobiliary: 7 mm rounded nonenhancing hypodensity in the left hepatic lobe is likely related to a cyst and is unchanged in appearance. The gallbladder is surgically absent. There is no biliary dilatation. Pancreas: Normal Spleen: Normal Adrenals/Urinary Tract: Unremarkable adrenal glands. The kidneys demonstrate no abnormal enhancement nor obstruction. Bladder is physiologically distended. No ureteral stones are seen. Stomach/Bowel: Mild diffuse fluid-filled distention of mid to distal ileal loops up to 2 cm may be secondary to an area stenosis seen involving the distal ileum noted on series 2, image 55 and 56. This could be postinflammatory in etiology. Small bowel neoplasm cannot be entirely excluded. No adjacent lymphadenopathy. The patient is status post appendectomy. There is a moderate amount of fecal retention throughout large bowel with scattered diverticulosis. Vascular/Lymphatic: Aortoiliac atherosclerosis without aneurysm. Reproductive: Hysterectomy.  No adnexal mass. Other:  No free air ascites.  Small fat containing umbilical hernia. Musculoskeletal: Right hip arthroplasty. L4-5 lumbar spinal fusion with interbody block noted. Disc space narrowing at L5-S1. No suspicious osseous abnormalities. IMPRESSION: Mild fluid-filled distention of mid to distal ileum secondary to a short segmental area of luminal narrowing involving the distal ileum. Findings may represent early or partial SBO secondary to  a  stricture. Neoplasm is not entirely excluded. Diffuse colonic diverticulosis without acute diverticulitis.  Bold Stable 7 mm hypodensity in the left hepatic lobe consistent with a cyst. Gallbladder surgically absent. Appendectomy and hysterectomy. Small fat containing umbilical hernia. Right hip arthroplasty as well as L4-5 lumbar spinal fusion. Faint ground-glass opacities in the lung bases likely related to atelectasis. Alveolitis/pneumonitis not entirely excluded. Electronically Signed   By: Tollie Eth M.D.   On: 04/05/2016 18:58   Dg Abd Portable 1v  Result Date: 04/07/2016 CLINICAL DATA:  Patient with history of small bowel obstruction. EXAM: PORTABLE ABDOMEN - 1 VIEW COMPARISON:  CT abdomen pelvis 04/05/2016; abdominal radiograph 08/18/2013. FINDINGS: Gas is demonstrated within nondilated loops of large and small bowel in a nonobstructed pattern. Oral contrast material within the transverse and proximal descending colon. Right hip arthroplasty. Lumbar spinal fusion hardware. Cholecystectomy clips. IMPRESSION: Nonobstructed bowel gas pattern. Electronically Signed   By: Annia Belt M.D.   On: 04/07/2016 07:41    Anti-infectives: Anti-infectives    None      Assessment/Plan: Impression: Partial small bowel obstruction, resolving Plan: We'll advance to full liquid diet. Advance diet as tolerated. No need for surgical intervention at this time.  LOS: 1 day    Shoichi Mielke A 04/07/2016

## 2016-04-07 NOTE — Progress Notes (Signed)
PROGRESS NOTE  [name] UXL:[id number] DOB: 09-07-1934 DOA: 04/05/2016 PCP: [name], MD   LOS: 1 day   Brief Narrative: 81 year old female with a potential, hyperlipidemia with prior bowel surgeries presented with acute onset of abdominal pain, diaphoresis with pain and more pronounced in the left lower quadrant. In the ED a CT of the abdomen done showed mild fluid-filled distention of mid to distal ileum suggestive of early versus partial small bowel obstruction. Placed on observation. Surgery following.  Assessment & Plan: Principal Problem:   SBO (small bowel obstruction) Active Problems:   Essential hypertension   COPD (chronic obstructive pulmonary disease) (HCC)   Dementia   Elevated troponin   Hyperglycemia   Chronic systolic CHF (congestive heart failure) (HCC)   Partial SBO (small bowel obstruction) - Likely secondary to adhesions and increased stool burden. General surgery consulted and following. - Conservative management, abdominal x-ray this morning looks likeSBO has resolved, d/w Dr. Lovell Sheehan with general surgery, allow a liquid diet today   Chronic systolic CHF - She appears euvolemic, we'll discontinue IV fluids today since will allow a diet. Most recent 2-D echo was done in July 2017 and it showed diffuse hypokinesis, with an EF of 45-50%, grade 1 diastolic dysfunction  Essential hypertension - Stable. Continue home medications with Coreg and losartan  COPD (chronic obstructive pulmonary disease) (HCC) - Stable  - Continue home inhalers.  Dementia  - ? Mild. Stable and oriented on evaluation.  Elevated troponin - Mild, peaked at 0.04. No EKG changes and no chest pain symptoms.   DVT prophylaxis: Lovenox Code Status: DNR Family Communication: d/w daughter over the phone Disposition Plan: TBD, home when ready   Consultants:   General surgery   Procedures:   None   Antimicrobials:  None    Subjective: - no chest pain,  shortness of breath - Her nausea has improved, no vomiting,  Objective: Vitals:   04/06/16 1034 04/06/16 1441 04/07/16 0400 04/07/16 0736  BP:  (!) 155/61 (!) 147/62   Pulse:  66 (!) 52   Resp:  18 18   Temp:  98.4 F (36.9 C) 97.7 F (36.5 C)   TempSrc:  Oral Oral   SpO2: 96% 95% 95% 91%  Weight:      Height:        Intake/Output Summary (Last 24 hours) at 04/07/16 1342 Last data filed at 04/07/16 1000  Gross per 24 hour  Intake          2502.08 ml  Output                0 ml  Net          2502.08 ml   Filed Weights   04/05/16 1649 04/05/16 2105  Weight: 71.7 kg (158 lb) 72.2 kg (159 lb 2.8 oz)    Examination: Constitutional: NAD Vitals:   04/06/16 1034 04/06/16 1441 04/07/16 0400 04/07/16 0736  BP:  (!) 155/61 (!) 147/62   Pulse:  66 (!) 52   Resp:  18 18   Temp:  98.4 F (36.9 C) 97.7 F (36.5 C)   TempSrc:  Oral Oral   SpO2: 96% 95% 95% 91%  Weight:      Height:       Eyes: PERRL, lids and conjunctivae normal Respiratory: clear to auscultation bilaterally, no wheezing, no crackles.  Cardiovascular: Regular rate and rhythm, no murmurs / rubs / gallops.  Abdomen: no tenderness. Bowel sounds positive.  Neurologic: Nonfocal   Data  Reviewed: I have personally reviewed following labs and imaging studies  CBC:  Recent Labs Lab 04/05/16 1654 04/06/16 0252  WBC 11.5* 8.3  NEUTROABS 9.2*  --   HGB 14.1 12.6  HCT 42.3 38.2  MCV 91.0 91.2  PLT 293 277   Basic Metabolic Panel:  Recent Labs Lab 04/05/16 1654 04/06/16 0252 04/07/16 0607  NA 141 139 140  K 4.0 3.8 3.9  CL 109 108 108  CO2 25 26 22   GLUCOSE 116* 87 56*  BUN 21* 14 12  CREATININE 1.12* 1.00 1.05*  CALCIUM 9.1 8.1* 8.4*   GFR: Estimated Creatinine Clearance: 43 mL/min (by C-G formula based on SCr of 1.05 mg/dL (H)). Liver Function Tests:  Recent Labs Lab 04/05/16 1654  AST 29  ALT 25  ALKPHOS 81  BILITOT 0.4  PROT 7.0  ALBUMIN 3.8    Recent Labs Lab 04/05/16 1654    LIPASE 19   No results for input(s): AMMONIA in the last 168 hours. Coagulation Profile: No results for input(s): INR, PROTIME in the last 168 hours. Cardiac Enzymes:  Recent Labs Lab 04/05/16 1654 04/05/16 2121 04/06/16 0252 04/06/16 0911  TROPONINI 0.04* 0.04* 0.04* 0.04*   BNP (last 3 results) No results for input(s): PROBNP in the last 8760 hours. HbA1C: No results for input(s): HGBA1C in the last 72 hours. CBG: No results for input(s): GLUCAP in the last 168 hours. Lipid Profile: No results for input(s): CHOL, HDL, LDLCALC, TRIG, CHOLHDL, LDLDIRECT in the last 72 hours. Thyroid Function Tests: No results for input(s): TSH, T4TOTAL, FREET4, T3FREE, THYROIDAB in the last 72 hours. Anemia Panel: No results for input(s): VITAMINB12, FOLATE, FERRITIN, TIBC, IRON, RETICCTPCT in the last 72 hours. Urine analysis:    Component Value Date/Time   COLORURINE YELLOW 04/05/2016 1654   APPEARANCEUR HAZY (A) 04/05/2016 1654   APPEARANCEUR Clear 10/28/2015 1622   LABSPEC 1.039 (H) 04/05/2016 1654   PHURINE 8.0 04/05/2016 1654   GLUCOSEU NEGATIVE 04/05/2016 1654   HGBUR NEGATIVE 04/05/2016 1654   BILIRUBINUR NEGATIVE 04/05/2016 1654   BILIRUBINUR Negative 10/28/2015 1622   KETONESUR NEGATIVE 04/05/2016 1654   PROTEINUR NEGATIVE 04/05/2016 1654   UROBILINOGEN 0.2 10/04/2013 1457   NITRITE NEGATIVE 04/05/2016 1654   LEUKOCYTESUR NEGATIVE 04/05/2016 1654   LEUKOCYTESUR Trace (A) 10/28/2015 1622   Sepsis Labs: Invalid input(s): PROCALCITONIN, LACTICIDVEN  Recent Results (from the past 240 hour(s))  Urine culture     Status: None (Preliminary result)   Collection Time: 04/05/16  8:01 PM  Result Value Ref Range Status   Specimen Description URINE, CLEAN CATCH  Final   Special Requests NONE  Final   Culture   Final    CULTURE REINCUBATED FOR BETTER GROWTH Performed at [facility]    Report Status PENDING  Incomplete      Radiology Studies: Dg Chest 2  View  Result Date: 04/05/2016 CLINICAL DATA:  Nausea, vomiting, abdominal pain.  Productive cough. EXAM: CHEST  2 VIEW COMPARISON:  11/14/2015 FINDINGS: Stable mild cardiomegaly and mediastinal contours. There is thoracic aortic atherosclerosis. Bibasilar atelectasis without confluent airspace disease. No pulmonary edema or pleural fluid. No pneumothorax. No acute osseous abnormality. IMPRESSION: 1. Bibasilar atelectasis. 2. Stable mild cardiomegaly and thoracic aortic atherosclerosis. Electronically Signed   By: 11/16/2015 M.D.   On: 04/05/2016 18:24   Ct Abdomen Pelvis W Contrast  Result Date: 04/05/2016 CLINICAL DATA:  Nausea, vomiting and abdominal pain since 11:30 a.m. today. History of appendectomy and hysterectomy. EXAM: CT ABDOMEN AND  PELVIS WITH CONTRAST TECHNIQUE: Multidetector CT imaging of the abdomen and pelvis was performed using the standard protocol following bolus administration of intravenous contrast. CONTRAST:  ISOVUE-300 IOPAMIDOL (ISOVUE-300) INJECTION 61% COMPARISON:  10/04/2015 CT FINDINGS: Lower chest: Chronic scarring in the periphery of both lower lobes with dependent atelectasis. Borderline cardiomegaly without pericardial effusion. No pleural effusion or pneumothorax. Faint ground-glass opacities in the posterior aspect of both lower lobes may be secondary to mild left alveolitis or pneumonitis. There is a small hiatal hernia. Hepatobiliary: 7 mm rounded nonenhancing hypodensity in the left hepatic lobe is likely related to a cyst and is unchanged in appearance. The gallbladder is surgically absent. There is no biliary dilatation. Pancreas: Normal Spleen: Normal Adrenals/Urinary Tract: Unremarkable adrenal glands. The kidneys demonstrate no abnormal enhancement nor obstruction. Bladder is physiologically distended. No ureteral stones are seen. Stomach/Bowel: Mild diffuse fluid-filled distention of mid to distal ileal loops up to 2 cm may be secondary to an area stenosis  seen involving the distal ileum noted on series 2, image 55 and 56. This could be postinflammatory in etiology. Small bowel neoplasm cannot be entirely excluded. No adjacent lymphadenopathy. The patient is status post appendectomy. There is a moderate amount of fecal retention throughout large bowel with scattered diverticulosis. Vascular/Lymphatic: Aortoiliac atherosclerosis without aneurysm. Reproductive: Hysterectomy.  No adnexal mass. Other:  No free air ascites.  Small fat containing umbilical hernia. Musculoskeletal: Right hip arthroplasty. L4-5 lumbar spinal fusion with interbody block noted. Disc space narrowing at L5-S1. No suspicious osseous abnormalities. IMPRESSION: Mild fluid-filled distention of mid to distal ileum secondary to a short segmental area of luminal narrowing involving the distal ileum. Findings may represent early or partial SBO secondary to a stricture. Neoplasm is not entirely excluded. Diffuse colonic diverticulosis without acute diverticulitis.  Bold Stable 7 mm hypodensity in the left hepatic lobe consistent with a cyst. Gallbladder surgically absent. Appendectomy and hysterectomy. Small fat containing umbilical hernia. Right hip arthroplasty as well as L4-5 lumbar spinal fusion. Faint ground-glass opacities in the lung bases likely related to atelectasis. Alveolitis/pneumonitis not entirely excluded. Electronically Signed   By: Tollie Eth M.D.   On: 04/05/2016 18:58   Dg Abd Portable 1v  Result Date: 04/07/2016 CLINICAL DATA:  Patient with history of small bowel obstruction. EXAM: PORTABLE ABDOMEN - 1 VIEW COMPARISON:  CT abdomen pelvis 04/05/2016; abdominal radiograph 08/18/2013. FINDINGS: Gas is demonstrated within nondilated loops of large and small bowel in a nonobstructed pattern. Oral contrast material within the transverse and proximal descending colon. Right hip arthroplasty. Lumbar spinal fusion hardware. Cholecystectomy clips. IMPRESSION: Nonobstructed bowel gas  pattern. Electronically Signed   By: Annia Belt M.D.   On: 04/07/2016 07:41     Scheduled Meds: . aspirin EC  81 mg Oral Daily  . buPROPion  150 mg Oral Daily  . carvedilol  6.25 mg Oral BID WC  . enoxaparin (LOVENOX) injection  40 mg Subcutaneous Q24H  . fluticasone  2 spray Each Nare Daily  . fluticasone furoate-vilanterol  1 puff Inhalation Daily  . loratadine  10 mg Oral Daily  . losartan  25 mg Oral Daily  . pantoprazole  80 mg Oral Daily  . raloxifene  60 mg Oral Daily   Continuous Infusions: . sodium chloride 10 mL/hr at 04/07/16 1118     Pamella Pert, MD, PhD [facility] Pager [phone]  If 7PM-7AM, please contact night-coverage [email or web address] Password [email or web address] 04/07/2016, 1:42 PM

## 2016-04-08 LAB — CBC
HCT: 38.9 % (ref 36.0–46.0)
HEMOGLOBIN: 13 g/dL (ref 12.0–15.0)
MCH: 30.2 pg (ref 26.0–34.0)
MCHC: 33.4 g/dL (ref 30.0–36.0)
MCV: 90.3 fL (ref 78.0–100.0)
PLATELETS: 296 10*3/uL (ref 150–400)
RBC: 4.31 MIL/uL (ref 3.87–5.11)
RDW: 14.6 % (ref 11.5–15.5)
WBC: 6.7 10*3/uL (ref 4.0–10.5)

## 2016-04-08 LAB — BASIC METABOLIC PANEL
ANION GAP: 7 (ref 5–15)
BUN: 10 mg/dL (ref 6–20)
CALCIUM: 8.6 mg/dL — AB (ref 8.9–10.3)
CO2: 24 mmol/L (ref 22–32)
CREATININE: 1.03 mg/dL — AB (ref 0.44–1.00)
Chloride: 111 mmol/L (ref 101–111)
GFR, EST AFRICAN AMERICAN: 59 mL/min — AB (ref >60)
GFR, EST NON AFRICAN AMERICAN: 51 mL/min — AB (ref >60)
Glucose, Bld: 94 mg/dL (ref 65–99)
Potassium: 3.6 mmol/L (ref 3.5–5.1)
SODIUM: 142 mmol/L (ref 135–145)

## 2016-04-08 NOTE — Discharge Instructions (Signed)
Follow with [name] Sheriff, MD in 2-3 weeks  Please get a complete blood count and chemistry panel checked by your Primary MD at your next visit, and again as instructed by your Primary MD. Please get your medications reviewed and adjusted by your Primary MD.  Please request your Primary MD to go over all Hospital Tests and Procedure/Radiological results at the follow up, please get all Hospital records sent to your Prim MD by signing hospital release before you go home.  If you had Pneumonia of Lung problems at the Hospital: Please get a 2 view Chest X ray done in 6-8 weeks after hospital discharge or sooner if instructed by your Primary MD.  If you have Congestive Heart Failure: Please call your Cardiologist or Primary MD anytime you have any of the following symptoms:  1) 3 pound weight gain in 24 hours or 5 pounds in 1 week  2) shortness of breath, with or without a dry hacking cough  3) swelling in the hands, feet or stomach  4) if you have to sleep on extra pillows at night in order to breathe  Follow cardiac low salt diet and 1.5 lit/day fluid restriction.  If you have diabetes Accuchecks 4 times/day, Once in AM empty stomach and then before each meal. Log in all results and show them to your primary doctor at your next visit. If any glucose reading is under 80 or above 300 call your primary MD immediately.  If you have Seizure/Convulsions/Epilepsy: Please do not drive, operate heavy machinery, participate in activities at heights or participate in high speed sports until you have seen by Primary MD or a Neurologist and advised to do so again.  If you had Gastrointestinal Bleeding: Please ask your Primary MD to check a complete blood count within one week of discharge or at your next visit. Your endoscopic/colonoscopic biopsies that are pending at the time of discharge, will also need to followed by your Primary MD.  Get Medicines reviewed and adjusted. Please take all your  medications with you for your next visit with your Primary MD  Please request your Primary MD to go over all hospital tests and procedure/radiological results at the follow up, please ask your Primary MD to get all Hospital records sent to his/her office.  If you experience worsening of your admission symptoms, develop shortness of breath, life threatening emergency, suicidal or homicidal thoughts you must seek medical attention immediately by calling 911 or calling your MD immediately  if symptoms less severe.  You must read complete instructions/literature along with all the possible adverse reactions/side effects for all the Medicines you take and that have been prescribed to you. Take any new Medicines after you have completely understood and accpet all the possible adverse reactions/side effects.   Do not drive or operate heavy machinery when taking Pain medications.   Do not take more than prescribed Pain, Sleep and Anxiety Medications  Special Instructions: If you have smoked or chewed Tobacco  in the last 2 yrs please stop smoking, stop any regular Alcohol  and or any Recreational drug use.  Wear Seat belts while driving.  Please note You were cared for by a hospitalist during your hospital stay. If you have any questions about your discharge medications or the care you received while you were in the hospital after you are discharged, you can call the unit and asked to speak with the hospitalist on call if the hospitalist that took care of you is not available.  Once you are discharged, your primary care physician will handle any further medical issues. Please note that NO REFILLS for any discharge medications will be authorized once you are discharged, as it is imperative that you return to your primary care physician (or establish a relationship with a primary care physician if you do not have one) for your aftercare needs so that they can reassess your need for medications and monitor your  lab values.  You can reach the hospitalist office at phone [phone] or fax [phone]   If you do not have a primary care physician, you can call [phone] for a physician referral.  Activity: As tolerated with Full fall precautions use walker/cane & assistance as needed  Diet: regular  Disposition Home

## 2016-04-08 NOTE — Progress Notes (Signed)
Pt is discharged home. IV removed. Discussed discharge paperwork with patient, pt verbalized understanding. Pt stated that her daughter will be here to transport home after 12n. Pt dressed and awaiting transportation.   Quita Skye, RN

## 2016-04-08 NOTE — Discharge Summary (Signed)
Physician Discharge Summary  [name] ZOX:[id number] DOB: 1934/09/07 DOA: 04/05/2016  PCP: [name], MD  Admit date: 04/05/2016 Discharge date: 04/08/2016  Admitted From: home Disposition:  home  Recommendations for Outpatient Follow-up:  1. Follow up with PCP in 1-2 weeks  Home Health: none  Equipment/Devices: none  Discharge Condition: stable CODE STATUS: Full code Diet recommendation: regular  HPI: Per Dr. [name], PartialSBO (small bowel obstruction) - Likely secondary to adhesions and increased stool burden. General surgery consulted and followed patient while hospitalized. She was managed with conservative management, IV fluids as well as serial abdominal x-rays. Fortunately her small bowel obstruction resolved with conservative management, she was able to pass gas and have several bowel movements, her diet was advanced and she was tolerating a regular diet. She was discharged home in stable condition.  Chronic systolic CHF - She appears euvolemic. Most recent 2-D echo was done in July 2017 and it showed diffuse hypokinesis, with an EF of 45-50%, grade 1 diastolic dysfunction. Further management as an outpatient Essential hypertension - Stable. Continue home medications with Coreg and losartan COPD (chronic obstructive pulmonary disease) (HCC) - Stable  Elevated troponin - Mild, peaked at 0.04. No EKG changes and no chest pain symptoms. Likely demand  Hospital Course: Discharge Diagnoses:  Principal Problem:   SBO (small bowel obstruction) Active Problems:   Essential hypertension   COPD (chronic obstructive pulmonary disease) (HCC)   Dementia   Elevated troponin   Hyperglycemia   Chronic systolic CHF (congestive heart failure) (HCC)  Discharge Instructions    Medication List    STOP taking these medications   azithromycin 250 MG tablet Commonly known as:  ZITHROMAX     TAKE these medications   albuterol 108 (90 Base) MCG/ACT inhaler Commonly  known as:  PROVENTIL HFA;VENTOLIN HFA Inhale 2 puffs into the lungs every 6 (six) hours as needed for wheezing or shortness of breath.   aspirin EC 81 MG tablet Take 81 mg by mouth daily.   buPROPion 150 MG 12 hr tablet Commonly known as:  WELLBUTRIN SR Take 1 tablet (150 mg total) by mouth 2 (two) times daily. What changed:  when to take this   carvedilol 6.25 MG tablet Commonly known as:  COREG Take 1 tablet (6.25 mg total) by mouth 2 (two) times daily with a meal.   CENTRUM SILVER PO Take 1 tablet by mouth daily.   cetirizine 10 MG tablet Commonly known as:  ZYRTEC Take 1 tablet (10 mg total) by mouth at bedtime.   esomeprazole 40 MG capsule Commonly known as:  NEXIUM Take 1 capsule (40 mg total) by mouth daily before breakfast.   fluticasone furoate-vilanterol 200-25 MCG/INH Aepb Commonly known as:  BREO ELLIPTA Inhale 1 puff into the lungs daily.   ibuprofen 600 MG tablet Commonly known as:  ADVIL,MOTRIN Take 1 tablet (600 mg total) by mouth every 8 (eight) hours as needed.   losartan 50 MG tablet Commonly known as:  COZAAR Take 0.5 tablets (25 mg total) by mouth daily.   mometasone 50 MCG/ACT nasal spray Commonly known as:  NASONEX Place 2 sprays into the nose daily.   ondansetron 4 MG tablet Commonly known as:  ZOFRAN Take 1 tablet every 8 hours as needed for nausea or vomiting.   raloxifene 60 MG tablet Commonly known as:  EVISTA Take 1 tablet (60 mg total) by mouth daily.   triamcinolone 0.025 % ointment Commonly known as:  KENALOG Apply 1 application topically 2 (two)  times daily.   vitamin B-12 1000 MCG tablet Commonly known as:  CYANOCOBALAMIN Take 1,000 mcg by mouth daily.      Follow-up Information    [name], MD. Schedule an appointment as soon as possible for a visit in 2 week(s).   Specialty:  Pediatrics Contact information: [address] [place] [address] [phone]          Allergies  Allergen Reactions  .  Lisinopril-Hydrochlorothiazide Other (See Comments)    Lip swelling and cough. Stopped by ENT. Patient is on Losartan without any issues   . Codeine Itching and Swelling    eye irritation (prescribed percocet)  . Penicillins Itching and Swelling    Has patient had a PCN reaction causing immediate rash, facial/tongue/throat swelling, SOB or lightheadedness with hypotension: Yes Has patient had a PCN reaction causing severe rash involving mucus membranes or skin necrosis: No Has patient had a PCN reaction that required hospitalization No Has patient had a PCN reaction occurring within the last 10 years: No If all of the above answers are "NO", then may proceed with Cephalosporin use. eye irritation    Consultations:  General surgery   Procedures/Studies:  Dg Chest 2 View  Result Date: 04/05/2016 CLINICAL DATA:  Nausea, vomiting, abdominal pain.  Productive cough. EXAM: CHEST  2 VIEW COMPARISON:  11/14/2015 FINDINGS: Stable mild cardiomegaly and mediastinal contours. There is thoracic aortic atherosclerosis. Bibasilar atelectasis without confluent airspace disease. No pulmonary edema or pleural fluid. No pneumothorax. No acute osseous abnormality. IMPRESSION: 1. Bibasilar atelectasis. 2. Stable mild cardiomegaly and thoracic aortic atherosclerosis. Electronically Signed   By: Rubye Oaks M.D.   On: 04/05/2016 18:24   Ct Abdomen Pelvis W Contrast  Result Date: 04/05/2016 CLINICAL DATA:  Nausea, vomiting and abdominal pain since 11:30 a.m. today. History of appendectomy and hysterectomy. EXAM: CT ABDOMEN AND PELVIS WITH CONTRAST TECHNIQUE: Multidetector CT imaging of the abdomen and pelvis was performed using the standard protocol following bolus administration of intravenous contrast. CONTRAST:  ISOVUE-300 IOPAMIDOL (ISOVUE-300) INJECTION 61% COMPARISON:  10/04/2015 CT FINDINGS: Lower chest: Chronic scarring in the periphery of both lower lobes with dependent atelectasis. Borderline  cardiomegaly without pericardial effusion. No pleural effusion or pneumothorax. Faint ground-glass opacities in the posterior aspect of both lower lobes may be secondary to mild left alveolitis or pneumonitis. There is a small hiatal hernia. Hepatobiliary: 7 mm rounded nonenhancing hypodensity in the left hepatic lobe is likely related to a cyst and is unchanged in appearance. The gallbladder is surgically absent. There is no biliary dilatation. Pancreas: Normal Spleen: Normal Adrenals/Urinary Tract: Unremarkable adrenal glands. The kidneys demonstrate no abnormal enhancement nor obstruction. Bladder is physiologically distended. No ureteral stones are seen. Stomach/Bowel: Mild diffuse fluid-filled distention of mid to distal ileal loops up to 2 cm may be secondary to an area stenosis seen involving the distal ileum noted on series 2, image 55 and 56. This could be postinflammatory in etiology. Small bowel neoplasm cannot be entirely excluded. No adjacent lymphadenopathy. The patient is status post appendectomy. There is a moderate amount of fecal retention throughout large bowel with scattered diverticulosis. Vascular/Lymphatic: Aortoiliac atherosclerosis without aneurysm. Reproductive: Hysterectomy.  No adnexal mass. Other:  No free air ascites.  Small fat containing umbilical hernia. Musculoskeletal: Right hip arthroplasty. L4-5 lumbar spinal fusion with interbody block noted. Disc space narrowing at L5-S1. No suspicious osseous abnormalities. IMPRESSION: Mild fluid-filled distention of mid to distal ileum secondary to a short segmental area of luminal narrowing involving the  distal ileum. Findings may represent early or partial SBO secondary to a stricture. Neoplasm is not entirely excluded. Diffuse colonic diverticulosis without acute diverticulitis.  Bold Stable 7 mm hypodensity in the left hepatic lobe consistent with a cyst. Gallbladder surgically absent. Appendectomy and hysterectomy. Small fat containing  umbilical hernia. Right hip arthroplasty as well as L4-5 lumbar spinal fusion. Faint ground-glass opacities in the lung bases likely related to atelectasis. Alveolitis/pneumonitis not entirely excluded. Electronically Signed   By: Tollie Eth M.D.   On: 04/05/2016 18:58   Dg Abd Portable 1v  Result Date: 04/07/2016 CLINICAL DATA:  Patient with history of small bowel obstruction. EXAM: PORTABLE ABDOMEN - 1 VIEW COMPARISON:  CT abdomen pelvis 04/05/2016; abdominal radiograph 08/18/2013. FINDINGS: Gas is demonstrated within nondilated loops of large and small bowel in a nonobstructed pattern. Oral contrast material within the transverse and proximal descending colon. Right hip arthroplasty. Lumbar spinal fusion hardware. Cholecystectomy clips. IMPRESSION: Nonobstructed bowel gas pattern. Electronically Signed   By: Annia Belt M.D.   On: 04/07/2016 07:41      Subjective: - no chest pain, shortness of breath, no abdominal pain, nausea or vomiting.   Discharge Exam: Vitals:   04/07/16 2211 04/08/16 0606  BP: (!) 152/53 (!) 141/54  Pulse: (!) 58 (!) 56  Resp: 20 15  Temp: 97.9 F (36.6 C) 98.1 F (36.7 C)   Vitals:   04/07/16 1415 04/07/16 2211 04/08/16 0606 04/08/16 0952  BP: (!) 123/44 (!) 152/53 (!) 141/54   Pulse: 62 (!) 58 (!) 56   Resp: 18 20 15    Temp: 97.7 F (36.5 C) 97.9 F (36.6 C) 98.1 F (36.7 C)   TempSrc: Oral Oral Oral   SpO2: 98% 98% 100% 98%  Weight:      Height:        General: Pt is alert, awake, not in acute distress Cardiovascular: RRR, S1/S2 +, no rubs, no gallops Respiratory: CTA bilaterally, no wheezing, no rhonchi Abdominal: Soft, NT, ND, bowel sounds + Extremities: no edema, no cyanosis    The results of significant diagnostics from this hospitalization (including imaging, microbiology, ancillary and laboratory) are listed below for reference.     Microbiology: Recent Results (from the past 240 hour(s))  Urine culture     Status: Abnormal    Collection Time: 04/05/16  8:01 PM  Result Value Ref Range Status   Specimen Description URINE, CLEAN CATCH  Final   Special Requests NONE  Final   Culture MULTIPLE SPECIES PRESENT, SUGGEST RECOLLECTION (A)  Final   Report Status 04/07/2016 FINAL  Final     Labs: BNP (last 3 results) No results for input(s): BNP in the last 8760 hours. Basic Metabolic Panel:  Recent Labs Lab 04/05/16 1654 04/06/16 0252 04/07/16 0607 04/08/16 0544  NA 141 139 140 142  K 4.0 3.8 3.9 3.6  CL 109 108 108 111  CO2 25 26 22 24   GLUCOSE 116* 87 56* 94  BUN 21* 14 12 10   CREATININE 1.12* 1.00 1.05* 1.03*  CALCIUM 9.1 8.1* 8.4* 8.6*   Liver Function Tests:  Recent Labs Lab 04/05/16 1654  AST 29  ALT 25  ALKPHOS 81  BILITOT 0.4  PROT 7.0  ALBUMIN 3.8    Recent Labs Lab 04/05/16 1654  LIPASE 19   No results for input(s): AMMONIA in the last 168 hours. CBC:  Recent Labs Lab 04/05/16 1654 04/06/16 0252 04/08/16 0544  WBC 11.5* 8.3 6.7  NEUTROABS 9.2*  --   --  HGB 14.1 12.6 13.0  HCT 42.3 38.2 38.9  MCV 91.0 91.2 90.3  PLT 293 277 296   Cardiac Enzymes:  Recent Labs Lab 04/05/16 1654 04/05/16 2121 04/06/16 0252 04/06/16 0911  TROPONINI 0.04* 0.04* 0.04* 0.04*   BNP: Invalid input(s): POCBNP CBG: No results for input(s): GLUCAP in the last 168 hours. D-Dimer No results for input(s): DDIMER in the last 72 hours. Hgb A1c No results for input(s): HGBA1C in the last 72 hours. Lipid Profile No results for input(s): CHOL, HDL, LDLCALC, TRIG, CHOLHDL, LDLDIRECT in the last 72 hours. Thyroid function studies No results for input(s): TSH, T4TOTAL, T3FREE, THYROIDAB in the last 72 hours.  Invalid input(s): FREET3 Anemia work up No results for input(s): VITAMINB12, FOLATE, FERRITIN, TIBC, IRON, RETICCTPCT in the last 72 hours. Urinalysis    Component Value Date/Time   COLORURINE YELLOW 04/05/2016 1654   APPEARANCEUR HAZY (A) 04/05/2016 1654   APPEARANCEUR Clear  10/28/2015 1622   LABSPEC 1.039 (H) 04/05/2016 1654   PHURINE 8.0 04/05/2016 1654   GLUCOSEU NEGATIVE 04/05/2016 1654   HGBUR NEGATIVE 04/05/2016 1654   BILIRUBINUR NEGATIVE 04/05/2016 1654   BILIRUBINUR Negative 10/28/2015 1622   KETONESUR NEGATIVE 04/05/2016 1654   PROTEINUR NEGATIVE 04/05/2016 1654   UROBILINOGEN 0.2 10/04/2013 1457   NITRITE NEGATIVE 04/05/2016 1654   LEUKOCYTESUR NEGATIVE 04/05/2016 1654   LEUKOCYTESUR Trace (A) 10/28/2015 1622   Sepsis Labs Invalid input(s): PROCALCITONIN,  WBC,  LACTICIDVEN Microbiology Recent Results (from the past 240 hour(s))  Urine culture     Status: Abnormal   Collection Time: 04/05/16  8:01 PM  Result Value Ref Range Status   Specimen Description URINE, CLEAN CATCH  Final   Special Requests NONE  Final   Culture MULTIPLE SPECIES PRESENT, SUGGEST RECOLLECTION (A)  Final   Report Status 04/07/2016 FINAL  Final     Time coordinating discharge: Over 30 minutes  SIGNED:  Pamella PertGHERGHE, COSTIN, MD  [facility] 04/08/2016, 11:25 AM Pager [phone]  If 7PM-7AM, please contact night-coverage [email or web address] Password [email or web address]

## 2016-04-10 ENCOUNTER — Other Ambulatory Visit: Payer: Self-pay | Admitting: Family

## 2016-04-10 DIAGNOSIS — Z87891 Personal history of nicotine dependence: Secondary | ICD-10-CM

## 2016-04-11 ENCOUNTER — Ambulatory Visit (INDEPENDENT_AMBULATORY_CARE_PROVIDER_SITE_OTHER): Payer: Medicare Other | Admitting: Pediatrics

## 2016-04-11 ENCOUNTER — Encounter: Payer: Self-pay | Admitting: Pediatrics

## 2016-04-11 VITALS — BP 144/80 | HR 76 | Temp 97.7°F | Ht 64.0 in | Wt 157.6 lb

## 2016-04-11 DIAGNOSIS — G4700 Insomnia, unspecified: Secondary | ICD-10-CM | POA: Diagnosis not present

## 2016-04-11 DIAGNOSIS — J3089 Other allergic rhinitis: Secondary | ICD-10-CM

## 2016-04-11 DIAGNOSIS — K5900 Constipation, unspecified: Secondary | ICD-10-CM | POA: Insufficient documentation

## 2016-04-11 DIAGNOSIS — R1032 Left lower quadrant pain: Secondary | ICD-10-CM | POA: Diagnosis not present

## 2016-04-11 MED ORDER — LORATADINE 10 MG PO TABS: 10.0000 mg | ORAL_TABLET | Freq: Every day | ORAL | 11 refills | Status: DC

## 2016-04-11 MED ORDER — POLYETHYLENE GLYCOL 3350 17 GM/SCOOP PO POWD: 17.0000 g | Freq: Two times a day (BID) | ORAL | 1 refills | Status: DC | PRN

## 2016-04-11 MED ORDER — AZELASTINE-FLUTICASONE 137-50 MCG/ACT NA SUSP: NASAL | 1 refills | Status: DC

## 2016-04-11 NOTE — Patient Instructions (Signed)
Take melatonin at night Turn off TV 2 hours before bed

## 2016-04-11 NOTE — Progress Notes (Signed)
  Subjective:   Patient ID: [name], female    DOB: [birth year], 81 y.o.   MRN: [id number] CC: Hospitalization Follow-up  HPI: [name] is a 81 y.o. female presenting for Hospitalization Follow-up  Recent hospitalization for SBO, treated conservatively Has had multiple surgeries in the past, also with some constipation Feeling much better now  Constipation: used to take milk of magnesia  hasnt taken anything since leaving the hospital Last stool 3-4 days, is passing gas regularly No fevers No abd pain Appetite has been slightly down, much improved since presentation to hospital though  Breathing well controlled  HTN: no HA, no CP, taking meds daily  Insomnia: has a hard time fallin gasleep, gets in bed 1030 apprx Watches TV before getting into bed No naps Not taking anything now to help  Allergic rhinitis: continues to have clear rhinorrhea regularly. Taking zyrtec, nasonex  Relevant past medical, surgical, family and social history reviewed. Allergies and medications reviewed and updated. History  Smoking Status  . Current Some Day Smoker  . Packs/day: 0.10  . Years: 60.00  . Types: Cigarettes  Smokeless Tobacco  . Never Used   ROS: Per HPI   Objective:    BP (!) 144/80   Pulse 76   Temp 97.7 F (36.5 C) (Oral)   Ht 5\' 4"  (1.626 m)   Wt 157 lb 9.6 oz (71.5 kg)   BMI 27.05 kg/m   Wt Readings from Last 3 Encounters:  04/11/16 157 lb 9.6 oz (71.5 kg)  04/05/16 159 lb 2.8 oz (72.2 kg)  04/02/16 158 lb (71.7 kg)    Gen: NAD, alert, cooperative with exam, NCAT EYES: EOMI, no conjunctival injection, or no icterus CV: NRRR, normal S1/S2, no murmur Resp: CTABL, no wheezes, normal WOB Abd: +BS, soft, NTND. no guarding or organomegaly Ext: No edema, warm Neuro: Alert and oriented MSK: normal muscle bulk  Assessment & Plan:  [name] was seen today for hospitalization follow-up.  Diagnoses and all orders for this visit:  Chronic allergic rhinitis  due to other allergic trigger, unspecified seasonality Try dymista Can add claritin if still symptomatic -     Azelastine-Fluticasone 137-50 MCG/ACT SUSP; One spray per nostril twice a day  Constipation, unspecified constipation type Goal daily bowel movements, recent admission for SBO due likely to adhesions/constipation Stool softeners daily miralax daily Increase miralax if goes 24h without stool -     polyethylene glycol powder (GLYCOLAX/MIRALAX) powder; Take 17 g by mouth 2 (two) times daily as needed.  Insomnia, unspecified type Melatonin at night, discussed sleep hygiene  Left lower quadrant pain Resolved.  Follow up plan: Return in about 3 months (around 07/10/2016). 07/12/2016, MD [facility]

## 2016-04-13 ENCOUNTER — Other Ambulatory Visit: Payer: Self-pay | Admitting: Family

## 2016-04-13 DIAGNOSIS — M810 Age-related osteoporosis without current pathological fracture: Secondary | ICD-10-CM

## 2016-04-21 ENCOUNTER — Other Ambulatory Visit: Payer: Self-pay | Admitting: Pediatrics

## 2016-04-26 ENCOUNTER — Other Ambulatory Visit: Payer: Self-pay | Admitting: Family

## 2016-04-26 DIAGNOSIS — J45909 Unspecified asthma, uncomplicated: Secondary | ICD-10-CM

## 2016-05-18 ENCOUNTER — Other Ambulatory Visit: Payer: Self-pay | Admitting: Family

## 2016-05-18 DIAGNOSIS — K219 Gastro-esophageal reflux disease without esophagitis: Secondary | ICD-10-CM

## 2016-05-30 ENCOUNTER — Encounter: Payer: Medicare Other | Admitting: *Deleted

## 2016-05-31 ENCOUNTER — Ambulatory Visit: Payer: Medicare Other | Admitting: Pediatrics

## 2016-06-04 ENCOUNTER — Ambulatory Visit (INDEPENDENT_AMBULATORY_CARE_PROVIDER_SITE_OTHER): Payer: Medicare Other | Admitting: Pediatrics

## 2016-06-04 ENCOUNTER — Encounter: Payer: Self-pay | Admitting: Pediatrics

## 2016-06-04 VITALS — BP 136/72 | HR 63 | Temp 98.4°F | Ht 64.0 in | Wt 160.4 lb

## 2016-06-04 DIAGNOSIS — J069 Acute upper respiratory infection, unspecified: Secondary | ICD-10-CM | POA: Diagnosis not present

## 2016-06-04 DIAGNOSIS — R413 Other amnesia: Secondary | ICD-10-CM | POA: Diagnosis not present

## 2016-06-04 MED ORDER — DONEPEZIL HCL 5 MG PO TABS: 5.0000 mg | ORAL_TABLET | Freq: Every day | ORAL | 1 refills | Status: DC

## 2016-06-04 NOTE — Progress Notes (Signed)
  Subjective:   Patient ID: [name], female    DOB: 1934/08/07, 82 y.o.   MRN: [id number] CC: Nasal Congestion and Cough  HPI: [name] is a 82 y.o. female presenting for Nasal Congestion and Cough  Started early last week Feeling slightly better now Still with some congestion No fevers Appetite has been ok Not taking anything for it regularly  Thinks her memory has been worsening Hard time remembering things in short term Daughters have noticed it as well Has not gotten lost driving recently but sometimes will forget for a few seconds where she is going  MMSE - Mini Mental State Exam 06/04/2016 02/21/2016 10/26/2015  Orientation to time 3 5 2   Orientation to Place 2 5 5   Registration 3 3 3   Attention/ Calculation 2 4 3   Recall 2 0 2  Language- name 2 objects 2 2 2   Language- repeat 1 1 1   Language- follow 3 step command 2 3 3   Language- read & follow direction 0 1 1  Write a sentence 1 1 1   Copy design 1 1 0  Total score 19 26 23     Relevant past medical, surgical, family and social history reviewed. Allergies and medications reviewed and updated. History  Smoking Status  . Current Some Day Smoker  . Packs/day: 0.10  . Years: 60.00  . Types: Cigarettes  Smokeless Tobacco  . Never Used   ROS: Per HPI   Objective:    BP 136/72   Pulse 63   Temp 98.4 F (36.9 C) (Oral)   Ht 5\' 4"  (1.626 m)   Wt 160 lb 6.4 oz (72.8 kg)   BMI 27.53 kg/m   Wt Readings from Last 3 Encounters:  06/04/16 160 lb 6.4 oz (72.8 kg)  04/11/16 157 lb 9.6 oz (71.5 kg)  04/05/16 159 lb 2.8 oz (72.2 kg)    Gen: NAD, alert, cooperative with exam, NCAT EYES: EOMI, no conjunctival injection, or no icterus ENT:  TMs pearly gray b/l, OP without erythema LYMPH: no cervical LAD CV: NRRR, normal S1/S2, no murmur, distal pulses 2+ b/l Resp: CTABL, no wheezes, normal WOB Abd: +BS, soft, NTND. no guarding or organomegaly Ext: No edema, warm Neuro: Alert and oriented, strength  equal b/l UE and LE, coordination grossly normal MSK: normal muscle bulk  Assessment & Plan:  [name] was seen today for nasal congestion and cough.  Diagnoses and all orders for this visit:  Memory problem MRI last fall with bilateral parietal lobe atrophy, can be seen in alzheimers Mild chronic vascular changes mmse worse Will do trial of aricept for dementia Pt with good support from family in area -     donepezil (ARICEPT) 5 MG tablet; Take 1 tablet (5 mg total) by mouth at bedtime.  Acute URI Improving Likely viral Discussed symptom care  Follow up plan: Return in about 8 weeks (around 07/30/2016). , MD [facility]

## 2016-06-09 ENCOUNTER — Other Ambulatory Visit: Payer: Self-pay | Admitting: Pediatrics

## 2016-06-09 DIAGNOSIS — J3089 Other allergic rhinitis: Secondary | ICD-10-CM

## 2016-06-13 ENCOUNTER — Other Ambulatory Visit: Payer: Self-pay | Admitting: *Deleted

## 2016-06-21 ENCOUNTER — Other Ambulatory Visit: Payer: Self-pay | Admitting: Family

## 2016-07-04 ENCOUNTER — Other Ambulatory Visit: Payer: Self-pay | Admitting: Pediatrics

## 2016-07-23 ENCOUNTER — Ambulatory Visit: Payer: Medicare Other | Admitting: Pediatrics

## 2016-07-24 ENCOUNTER — Encounter: Payer: Self-pay | Admitting: Pediatrics

## 2016-07-24 ENCOUNTER — Other Ambulatory Visit: Payer: Self-pay | Admitting: Pediatrics

## 2016-07-24 ENCOUNTER — Other Ambulatory Visit: Payer: Self-pay | Admitting: Family

## 2016-07-24 DIAGNOSIS — Z87891 Personal history of nicotine dependence: Secondary | ICD-10-CM

## 2016-07-30 ENCOUNTER — Encounter: Payer: Self-pay | Admitting: Pediatrics

## 2016-07-30 ENCOUNTER — Ambulatory Visit (INDEPENDENT_AMBULATORY_CARE_PROVIDER_SITE_OTHER): Payer: Medicare Other | Admitting: Pediatrics

## 2016-07-30 VITALS — BP 186/92 | HR 79 | Temp 97.9°F | Ht 64.0 in | Wt 157.8 lb

## 2016-07-30 DIAGNOSIS — I10 Essential (primary) hypertension: Secondary | ICD-10-CM

## 2016-07-30 DIAGNOSIS — M25511 Pain in right shoulder: Secondary | ICD-10-CM | POA: Diagnosis not present

## 2016-07-30 DIAGNOSIS — G8929 Other chronic pain: Secondary | ICD-10-CM

## 2016-07-30 DIAGNOSIS — Z720 Tobacco use: Secondary | ICD-10-CM | POA: Diagnosis not present

## 2016-07-30 DIAGNOSIS — M25561 Pain in right knee: Secondary | ICD-10-CM

## 2016-07-30 NOTE — Progress Notes (Addendum)
  Subjective:   Patient ID: [name], female    DOB: Dec 15, 1934, 82 y.o.   MRN: [id number] CC: Shoulder Pain (right, 3 weeks) and Knee Pain (right, 3 weeks)  HPI: [name] is a 82 y.o. female presenting for Shoulder Pain (right, 3 weeks) and Knee Pain (right, 3 weeks)  R shoulder has been bothering her off and on, will "grab" regularly throughout the day Has been taking care of her son who has b/l LE amputation sand was just told will need AKA R side, may not be able to return to home with pt Had an injection about 6 months ago in shoulder, thinks it helped a lot Mostly the front of her shoulder bothering her  Rarely taking 800mg  ibuprofen for pain, not daily, a couple times a week Does help with pain  R knee bothers her a couple times a week  More when she is standing for long periods of time like with sweeping her carport, or doing dishes No lone place in shoulder that bothers her most  Smokes not every day, several times a week Open to quitting  Elevated BP, no CP, no SOB, upset today  Relevant past medical, surgical, family and social history reviewed. Allergies and medications reviewed and updated. History  Smoking Status  . Current Some Day Smoker  . Packs/day: 0.10  . Years: 60.00  . Types: Cigarettes  Smokeless Tobacco  . Never Used   ROS: Per HPI   Objective:    BP (!) 186/92   Pulse 79   Temp 97.9 F (36.6 C) (Oral)   Ht 5\' 4"  (1.626 m)   Wt 157 lb 12.8 oz (71.6 kg)   BMI 27.09 kg/m   Wt Readings from Last 3 Encounters:  07/30/16 157 lb 12.8 oz (71.6 kg)  06/04/16 160 lb 6.4 oz (72.8 kg)  04/11/16 157 lb 9.6 oz (71.5 kg)    Gen: NAD, alert, cooperative with exam, NCAT EYES: EOMI, no conjunctival injection, or no icterus ENT: OP without erythema CV: NRRR, normal S1/S2, no murmur, distal pulses 2+ b/l Resp: CTABL, no wheezes, normal WOB Ext: No edema, warm Neuro: Alert and oriented MSK: decreased internal rotation R shoulder, can raise  both arms over head b/l equally, no redness, swelling of shoulder Tender anterior  With palpation No point tenderness R knee, no effusion  Assessment & Plan:  [name] was seen today for shoulder pain and knee pain.  Diagnoses and all orders for this visit:  Right shoulder pain, unspecified chronicity Discussed options, shoulder injection helped last time, will repeat today Has had increased use of R arm caring for son who is in bed, he is now likely to be going to rehab Discussed ROM exercises, return precautions  Chronic pain of right knee Rest with when starts, ok to take NSAIds a couple times a week  Essential hypertension Elevated today, upset with family illness Usually well controlled Check at home, let me know if remains elevated  Tobacco use Cont to encourage cessation  Intrarticular Shoulder Injection Verbal consent was obtained from the patient. Risks including infection explained and contrasted with benefits and alternatives. Patient prepped with betadine. An intraarticular shoulder injection was performed using the posterior approach. The patient tolerated the procedure well. No complications. Injection: 3 cc of Lidocaine 1% and Kenalog 40 mg. Needle: 22 gauge   Follow up plan: Return in about 3 months (around 10/29/2016). [name], MD 12/30/2016 [facility]

## 2016-08-01 ENCOUNTER — Telehealth: Payer: Self-pay | Admitting: *Deleted

## 2016-08-01 DIAGNOSIS — M436 Torticollis: Secondary | ICD-10-CM

## 2016-08-01 MED ORDER — IBUPROFEN 600 MG PO TABS: 600.0000 mg | ORAL_TABLET | Freq: Three times a day (TID) | ORAL | 0 refills | Status: DC | PRN

## 2016-08-01 NOTE — Telephone Encounter (Signed)
Patient says when she saw you on Monday for her shoulder pain , you agreed to send in a refill on her  Motrin.  Please advise.

## 2016-08-02 ENCOUNTER — Ambulatory Visit: Payer: Medicare Other | Admitting: Pediatrics

## 2016-08-02 NOTE — Telephone Encounter (Signed)
Pt aware.

## 2016-08-03 ENCOUNTER — Other Ambulatory Visit: Payer: Self-pay | Admitting: Pediatrics

## 2016-08-03 DIAGNOSIS — J441 Chronic obstructive pulmonary disease with (acute) exacerbation: Secondary | ICD-10-CM

## 2016-08-03 DIAGNOSIS — M436 Torticollis: Secondary | ICD-10-CM

## 2016-08-03 MED ORDER — IBUPROFEN 600 MG PO TABS: 600.0000 mg | ORAL_TABLET | Freq: Three times a day (TID) | ORAL | 0 refills | Status: DC | PRN

## 2016-08-03 NOTE — Telephone Encounter (Signed)
Pt notified of RX 

## 2016-08-06 ENCOUNTER — Other Ambulatory Visit: Payer: Self-pay | Admitting: Pediatrics

## 2016-08-06 DIAGNOSIS — J3089 Other allergic rhinitis: Secondary | ICD-10-CM

## 2016-08-20 ENCOUNTER — Ambulatory Visit (INDEPENDENT_AMBULATORY_CARE_PROVIDER_SITE_OTHER): Payer: Medicare Other | Admitting: Pediatrics

## 2016-08-20 ENCOUNTER — Encounter: Payer: Self-pay | Admitting: Pediatrics

## 2016-08-20 VITALS — BP 145/81 | HR 62 | Temp 98.6°F | Ht 64.0 in | Wt 157.0 lb

## 2016-08-20 DIAGNOSIS — R197 Diarrhea, unspecified: Secondary | ICD-10-CM | POA: Diagnosis not present

## 2016-08-20 DIAGNOSIS — M545 Low back pain, unspecified: Secondary | ICD-10-CM

## 2016-08-20 DIAGNOSIS — R112 Nausea with vomiting, unspecified: Secondary | ICD-10-CM | POA: Diagnosis not present

## 2016-08-20 DIAGNOSIS — R413 Other amnesia: Secondary | ICD-10-CM

## 2016-08-20 MED ORDER — DONEPEZIL HCL 10 MG PO TABS: 10.0000 mg | ORAL_TABLET | Freq: Every day | ORAL | 3 refills | Status: DC

## 2016-08-20 NOTE — Progress Notes (Signed)
  Subjective:   Patient ID: [name], female    DOB: 1935/02/16, 82 y.o.   MRN: [id number] CC: Hip Pain (left) and Labs Only  HPI: [name] is a 82 y.o. female presenting for Hip Pain (left) and Labs Only  Has L hip pain, ongoing apprx 2 weeks Cant think of anything that set it off h/o R hip replacement several years, R hip doesn't bother her anymore Bending makes it worse Ibuprofen helps some, takes it most mornings after breakfast  Thinks her memory is doing ok Has some trouble remembering short term at times, says daughters tell her she is repeating herself Has been on 76m aricept for several months Thinks it might be helping, isnt sure she has noticed a difference  Asks about her thyroid, if she needs to have it checked. No h/o thyroid problems, last checked 10/2015  MMSE - Mini Mental State Exam 06/04/2016 02/21/2016 10/26/2015  Orientation to time _0 Orientation to Place _1 Registration _2 Attention/ Calculation _3 Recall 2 0 2  Language- name 2 objects _4 Language- repeat _5 Language- follow 3 step command _6 Language- read & follow direction 0 1 1  Write a sentence _7 Copy design 1 1 0  Total score _8 Relevant past medical, surgical, family and social history reviewed. Allergies and medications reviewed and updated. History  Smoking Status  . Current Some Day Smoker  . Packs/day: 0.10  . Years: 60.00  . Types: Cigarettes  Smokeless Tobacco  . Never Used   ROS: Per HPI   Objective:    BP (!) 145/81   Pulse 62   Temp 98.6 F (37 C) (Oral)   Ht 5' 4" (1.626 m)   Wt 157 lb (71.2 kg)   BMI 26.95 kg/m   Wt Readings from Last 3 Encounters:  08/21/16 157 lb (71.2 kg)  08/20/16 157 lb (71.2 kg)  07/30/16 157 lb 12.8 oz (71.6 kg)    Gen: NAD, alert, cooperative with exam, NCAT EYES: EOMI, no conjunctival injection, or no icterus ENT:  OP without erythema LYMPH: no cervical LAD CV: NRRR, normal S1/S2, no  murmur Resp: CTABL, no wheezes, normal WOB Ext: No edema, warm Neuro: Alert and oriented to person, place, time, date, strength equal b/l UE and LE, coordination grossly normal MSK: pain with internal rotation L hip, ext rotation better L hip compared with R hip, no pain with R hip ROM, no ttp over trochanteric bursa b/l, no ttp over spiny processes, ttp over L SI joint, no paraspinal muscle tenderness b/l  Assessment & Plan:  MBibiwas seen today for hip pain and labs only.  Diagnoses and all orders for this visit:  Acute left-sided low back pain without sciatica ttp over L SI joint, some decreased internal rotation with L hip will get xray Taking ibuprofen regularly, recheck BMP for Cr level -     DG Si Joints; Future -     BMP8+EGFR  Memory problem MMSE last visit was slightly down due to missing orientation questions, pt oriented today to all questions Has been on aricept 520m will increase to 1096mue to ongoing problems with memory   Follow up plan: Return in about 3 months (around 11/19/2016). CarAssunta FoundD WesOvid

## 2016-08-21 ENCOUNTER — Telehealth: Payer: Self-pay | Admitting: Pediatrics

## 2016-08-21 ENCOUNTER — Emergency Department (HOSPITAL_COMMUNITY)
Admission: EM | Admit: 2016-08-21 | Discharge: 2016-08-21 | Disposition: A | Discharge status: Home / Self Care | Payer: Medicare Other | Attending: Emergency Medicine | Admitting: Emergency Medicine

## 2016-08-21 ENCOUNTER — Encounter (HOSPITAL_COMMUNITY): Payer: Self-pay

## 2016-08-21 DIAGNOSIS — R112 Nausea with vomiting, unspecified: Secondary | ICD-10-CM | POA: Insufficient documentation

## 2016-08-21 DIAGNOSIS — Z7982 Long term (current) use of aspirin: Secondary | ICD-10-CM | POA: Diagnosis not present

## 2016-08-21 DIAGNOSIS — F17210 Nicotine dependence, cigarettes, uncomplicated: Secondary | ICD-10-CM | POA: Diagnosis not present

## 2016-08-21 DIAGNOSIS — R197 Diarrhea, unspecified: Secondary | ICD-10-CM | POA: Insufficient documentation

## 2016-08-21 DIAGNOSIS — Z79899 Other long term (current) drug therapy: Secondary | ICD-10-CM | POA: Insufficient documentation

## 2016-08-21 DIAGNOSIS — I5022 Chronic systolic (congestive) heart failure: Secondary | ICD-10-CM | POA: Insufficient documentation

## 2016-08-21 DIAGNOSIS — J449 Chronic obstructive pulmonary disease, unspecified: Secondary | ICD-10-CM | POA: Diagnosis not present

## 2016-08-21 DIAGNOSIS — I110 Hypertensive heart disease with heart failure: Secondary | ICD-10-CM | POA: Diagnosis not present

## 2016-08-21 DIAGNOSIS — Z791 Long term (current) use of non-steroidal anti-inflammatories (NSAID): Secondary | ICD-10-CM | POA: Insufficient documentation

## 2016-08-21 LAB — BASIC METABOLIC PANEL
ANION GAP: 8 (ref 5–15)
BUN: 12 mg/dL (ref 6–20)
CALCIUM: 7.9 mg/dL — AB (ref 8.9–10.3)
CO2: 23 mmol/L (ref 22–32)
Chloride: 107 mmol/L (ref 101–111)
Creatinine, Ser: 1.02 mg/dL — ABNORMAL HIGH (ref 0.44–1.00)
GFR calc Af Amer: 59 mL/min — ABNORMAL LOW (ref >60)
GFR, EST NON AFRICAN AMERICAN: 51 mL/min — AB (ref >60)
GLUCOSE: 135 mg/dL — AB (ref 65–99)
Potassium: 3.4 mmol/L — ABNORMAL LOW (ref 3.5–5.1)
SODIUM: 138 mmol/L (ref 135–145)

## 2016-08-21 LAB — BMP8+EGFR
BUN / CREAT RATIO: 12 (ref 12–28)
BUN: 13 mg/dL (ref 8–27)
CO2: 25 mmol/L (ref 18–29)
CREATININE: 1.12 mg/dL — AB (ref 0.57–1.00)
Calcium: 8.6 mg/dL — ABNORMAL LOW (ref 8.7–10.3)
Chloride: 105 mmol/L (ref 96–106)
GFR calc Af Amer: 54 mL/min/{1.73_m2} — ABNORMAL LOW (ref >59)
GFR, EST NON AFRICAN AMERICAN: 47 mL/min/{1.73_m2} — AB (ref >59)
GLUCOSE: 77 mg/dL (ref 65–99)
Potassium: 4.2 mmol/L (ref 3.5–5.2)
SODIUM: 145 mmol/L — AB (ref 134–144)

## 2016-08-21 MED ORDER — METOCLOPRAMIDE HCL 5 MG/ML IJ SOLN
2.5000 mg | Freq: Once | INTRAMUSCULAR | Status: AC
  Administered 2016-08-21: 2.5 mg via INTRAVENOUS
  Filled 2016-08-21: qty 2

## 2016-08-21 MED ORDER — LORAZEPAM 2 MG/ML IJ SOLN
0.5000 mg | Freq: Once | INTRAMUSCULAR | Status: AC
  Administered 2016-08-21: 0.5 mg via INTRAVENOUS
  Filled 2016-08-21: qty 1

## 2016-08-21 MED ORDER — ONDANSETRON 8 MG PO TBDP: 8.0000 mg | ORAL_TABLET | Freq: Three times a day (TID) | ORAL | 0 refills | Status: DC | PRN

## 2016-08-21 MED ORDER — DIPHENHYDRAMINE HCL 50 MG/ML IJ SOLN
12.5000 mg | Freq: Once | INTRAMUSCULAR | Status: AC
  Administered 2016-08-21: 12.5 mg via INTRAVENOUS
  Filled 2016-08-21: qty 1

## 2016-08-21 MED ORDER — SODIUM CHLORIDE 0.9 % IV BOLUS (SEPSIS)
500.0000 mL | Freq: Once | INTRAVENOUS | Status: AC
  Administered 2016-08-21: 500 mL via INTRAVENOUS

## 2016-08-21 MED ORDER — SODIUM CHLORIDE 0.9 % IV SOLN
INTRAVENOUS | Status: DC
  Administered 2016-08-21: 01:00:00 via INTRAVENOUS

## 2016-08-21 NOTE — ED Provider Notes (Signed)
[facility] Provider Note   CSN: [id number] Arrival date & time: 08/21/16  0008  By signing my name below, I, Nelwyn Salisbury, attest that this documentation has been prepared under the direction and in the presence of Lorre Nick, MD . Electronically Signed: Nelwyn Salisbury, Scribe. 08/21/2016. 12:21 AM.  History   Chief Complaint Chief Complaint  Patient presents with  . Emesis   The history is provided by the patient. No language interpreter was used.    HPI Comments:  [name] is a 82 y.o. female brought in by ambulance, who presents to the Emergency Department complaining of frequent, gradually resolving vomiting onset 2.5 hours ago. Pt states that she took an Aricept and immediately after, began vomiting. She reports associated nausea, diarrhea and fatigue. Per family, pt was prescribed Aricept in the past but discontinued taking the medication due to similar side-effects. Pt states that she went to her PCP today complaining of hip pain and he gave her 4 Aricept pills to take until she can fill another prescription. Per EMS, pt was given 4 po Zofran and 4 IV zofran en route with some relief. Denies any fevers.   Past Medical History:  Diagnosis Date  . Allergic rhinitis   . Anxiety   . Arthritis   . Cataract    had eye surgery  . Depression   . Essential hypertension, benign   . GERD (gastroesophageal reflux disease)   . History of blood transfusion   . Left bundle branch block   . Memory loss   . Mixed hyperlipidemia   . Occipital neuralgia of left side   . Osteopenia   . 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 Urinary urgency     Patient Active Problem List   Diagnosis Date Noted  . Constipation 04/11/2016  . Chronic systolic CHF (congestive heart failure) (HCC) 04/07/2016  . SBO (small bowel obstruction) (HCC) 04/05/2016  . Elevated troponin 04/05/2016  . Hyperglycemia 04/05/2016  . Syncope 11/15/2015  . Nausea and  vomiting 11/15/2015  . Dehydration 10/05/2015  . UTI (lower urinary tract infection) 10/05/2015  . Dementia 09/13/2015  . Memory problem 05/07/2015  . COPD (chronic obstructive pulmonary disease) (HCC) 04/19/2015  . Neck pain 04/06/2015  . Insomnia 12/10/2014  . BMI 28.0-28.9,adult 12/10/2014  . Osteopenia 05/19/2014  . GAD (generalized anxiety disorder) 01/06/2014  . Depression 01/06/2014  . GERD (gastroesophageal reflux disease) 01/06/2014  . Vitamin D deficiency 01/06/2014  . Overactive bladder 01/06/2014  . Secondary cardiomyopathy (HCC) 10/06/2013  . Left bundle branch block 10/06/2013  . Dizziness 10/04/2013  . Osteoarthritis of right hip 03/14/2013  . Cough 02/01/2011  . Reflux 02/01/2011  . Allergic rhinitis 12/07/2008  . Chronic cough 12/07/2008  . Hepatitis B virus infection 08/16/2008  . Hyperlipidemia 08/16/2008  . Essential hypertension 08/16/2008  . DISC DEGENERATION 07/24/2007    Past Surgical History:  Procedure Laterality Date  . APPENDECTOMY    . BACK SURGERY    . CARDIAC CATHETERIZATION  2010   Normal coronaries  . CATARACT EXTRACTION W/PHACO  04/16/2011   Procedure: CATARACT EXTRACTION PHACO AND INTRAOCULAR LENS PLACEMENT (IOC);  Surgeon: Susa Simmonds;  Location: [facility];  Service: Ophthalmology;  Laterality: Left;  CDE=11.35  . CATARACT EXTRACTION W/PHACO  01/28/2012   Procedure: CATARACT EXTRACTION PHACO AND INTRAOCULAR LENS PLACEMENT (IOC);  Surgeon: Susa Simmonds, MD;  Location: [facility];  Service: Ophthalmology;  Laterality: Right;  CDI:8.15  . CYSTO WITH HYDRODISTENSION  05/29/2012   Procedure: CYSTOSCOPY/HYDRODISTENSION;  Surgeon: Martina Sinner, MD;  Location: [facility];  Service: Urology;  Laterality: N/A;  INSTILLATION OF MARCAINE AND PYRIDIUM   . DILATION AND CURETTAGE OF UTERUS  ?<hysterectomy"  . HAMMER TOE SURGERY Bilateral 11/22/11   [facility], [name]  . JOINT REPLACEMENT    . LAPAROSCOPIC CHOLECYSTECTOMY  1991  . LUMBAR  LAMINECTOMY  2011  . LUMBAR LAMINECTOMY/DECOMPRESSION MICRODISCECTOMY Left 06/06/2012   Procedure: LUMBAR LAMINECTOMY/DECOMPRESSION MICRODISCECTOMY 1 LEVEL;  Surgeon: Maeola Harman, MD;  Location: [facility];  Service: Neurosurgery;  Laterality: Left;  Left Lumbar two-three Laminectomy for resection of synovial cyst  . SHOULDER OPEN ROTATOR CUFF REPAIR Right 2011  . TONSILLECTOMY    . TOTAL ABDOMINAL HYSTERECTOMY    . TOTAL HIP ARTHROPLASTY Right 03/13/2013   Procedure: TOTAL HIP ARTHROPLASTY;  Surgeon: Nestor Lewandowsky, MD;  Location: [facility];  Service: Orthopedics;  Laterality: Right;    OB History    Gravida Para Term Preterm AB Living   5 5 5     4    SAB TAB Ectopic Multiple Live Births                   Home Medications    Prior to Admission medications   Medication Sig Start Date End Date Taking? Authorizing Provider  albuterol (PROVENTIL HFA;VENTOLIN HFA) 108 (90 Base) MCG/ACT inhaler Inhale 2 puffs into the lungs every 6 (six) hours as needed for wheezing or shortness of breath. 01/09/16   Elige Radon Dettinger, MD  aspirin EC 81 MG tablet Take 81 mg by mouth daily.    Historical Provider, MD  buPROPion (WELLBUTRIN SR) 150 MG 12 hr tablet TAKE (1) TABLET TWICE A DAY. 07/24/16   [name], MD  carvedilol (COREG) 6.25 MG tablet Take 1 tablet 2 (two) times daily with a meal. 07/24/16   [name], MD  donepezil (ARICEPT) 10 MG tablet Take 1 tablet (10 mg total) by mouth at bedtime. 08/20/16   [name], MD  DYMISTA [phone] MCG/ACT SUSP One spray per nostril twice a day 08/06/16   [name], MD  esomeprazole (NEXIUM) 40 MG capsule Take 1 capsule (40 mg total) by mouth daily before breakfast. 05/18/16   Junie Spencer, FNP  ibuprofen (ADVIL,MOTRIN) 600 MG tablet Take 1 tablet (600 mg total) by mouth every 8 (eight) hours as needed. 08/03/16   [name], MD  loratadine (CLARITIN) 10 MG tablet Take 1 tablet (10 mg total) by mouth daily. 04/11/16   [name], MD    losartan (COZAAR) 50 MG tablet Take 0.5 tablets (25 mg total) by mouth daily. 10/13/15   Junie Spencer, FNP  Multiple Vitamins-Minerals (CENTRUM SILVER PO) Take 1 tablet by mouth daily.    Historical Provider, MD  ondansetron (ZOFRAN) 4 MG tablet Take 1 tablet every 8 hours as needed for nausea or vomiting. 07/04/16   [name], MD  polyethylene glycol powder (GLYCOLAX/MIRALAX) powder Take 17 g by mouth 2 (two) times daily as needed. 04/11/16   [name], MD  raloxifene (EVISTA) 60 MG tablet Take 1 tablet (60 mg total) by mouth daily. 04/16/16   [name], MD  SYMBICORT 160-4.5 MCG/ACT inhaler Inhale 2 puffs into the lungs 2 (two) times daily. 04/26/16   [name], MD  triamcinolone (KENALOG) 0.025 % ointment Apply 1 application topically 2 (two) times daily. 02/16/16   [name], MD  Family History Family History  Problem Relation Age of Onset  . Diabetes Father   . Diabetes Brother   . Cancer Brother 85    colon  . Diabetes Brother   . Diabetes Brother   . Hypertension Sister   . Diabetes Sister   . Diabetes Sister   . Diabetes Other   . Allergies Other   . Colon cancer Neg Hx   . Anesthesia problems Neg Hx   . Hypotension Neg Hx   . Malignant hyperthermia Neg Hx   . Pseudochol deficiency Neg Hx     Social History Social History  Substance Use Topics  . Smoking status: Current Some Day Smoker    Packs/day: 0.10    Years: 60.00    Types: Cigarettes  . Smokeless tobacco: Never Used  . Alcohol use No     Allergies   Lisinopril-hydrochlorothiazide; Codeine; and Penicillins   Review of Systems Review of Systems  Constitutional: Positive for fatigue. Negative for fever.  Gastrointestinal: Positive for diarrhea, nausea and vomiting.  All other systems reviewed and are negative.    Physical Exam Updated Vital Signs BP (!) 172/71 (BP Location: Right Arm)   Pulse (!) 47   Temp 97.4 F (36.3 C) (Oral)   Resp 16   Ht 5\' 4"  (1.626 m)    Wt 157 lb (71.2 kg)   SpO2 99%   BMI 26.95 kg/m   Physical Exam  Constitutional: She is oriented to person, place, and time. She appears well-developed and well-nourished.  Non-toxic appearance. No distress.  HENT:  Head: Normocephalic and atraumatic.  Eyes: Conjunctivae, EOM and lids are normal. Pupils are equal, round, and reactive to light.  Neck: Normal range of motion. Neck supple. No tracheal deviation present. No thyroid mass present.  Cardiovascular: Normal rate, regular rhythm and normal heart sounds.  Exam reveals no gallop.   No murmur heard. Pulmonary/Chest: Effort normal and breath sounds normal. No stridor. No respiratory distress. She has no decreased breath sounds. She has no wheezes. She has no rhonchi. She has no rales.  Abdominal: Soft. Normal appearance and bowel sounds are normal. She exhibits no distension. There is no tenderness. There is no rebound and no CVA tenderness.  Musculoskeletal: Normal range of motion. She exhibits no edema or tenderness.  Neurological: She is alert and oriented to person, place, and time. She has normal strength. No cranial nerve deficit or sensory deficit. GCS eye subscore is 4. GCS verbal subscore is 5. GCS motor subscore is 6.  Skin: Skin is warm and dry. No abrasion and no rash noted.  Psychiatric: She has a normal mood and affect. Her speech is normal and behavior is normal.  Nursing note and vitals reviewed.    ED Treatments / Results  DIAGNOSTIC STUDIES:  Oxygen Saturation is 99% on RA, normal by my interpretation.    COORDINATION OF CARE:  12:22 AM Discussed treatment plan with pt at bedside which includes symptomatic management and pt agreed to plan.  Labs (all labs ordered are listed, but only abnormal results are displayed) Labs Reviewed - No data to display  EKG  EKG Interpretation None       Radiology No results found.  Procedures Procedures (including critical care time)  Medications Ordered in  ED Medications - No data to display   Initial Impression / Assessment and Plan / ED Course  I have reviewed the triage vital signs and the nursing notes.  Pertinent labs & imaging results that were available  during my care of the patient were reviewed by me and considered in my medical decision making (see chart for details).     Patient medicated for nausea vomiting here feels better. She is sleepy but arousable. Her daughter who works in Teacher, music. We'll take her home. Was instructed to stop taking Aricept and to follow-up with her doctor.  Final Clinical Impressions(s) / ED Diagnoses   Final diagnoses:  None    New Prescriptions New Prescriptions   No medications on file  I personally performed the services described in this documentation, which was scribed in my presence. The recorded information has been reviewed and is accurate.        Lorre Nick, MD 08/21/16 [phone]

## 2016-08-21 NOTE — ED Triage Notes (Signed)
Having nausea, vomiting, and diarrhea.  IV started by EMS.  Zofran 4 mg PO given and Zofran 4 mg IV given by EMS.  Denies pain.  Started Aricept again tonight and this started after taking the medication.  She had the same symptoms before when she was on the medication Aricept.

## 2016-08-21 NOTE — Telephone Encounter (Signed)
I talked with both daughters Berna & [name] today about yesterdays appointment with Oswaldo Done & her ED visit d/t medication that was given here yesterday. Daughter Cecille Aver says to put Donepezil (Aricept) on allergies this is what caused everything last night & sent her to [facility] EMS ride. Says she has not taken this since Feb since she went to ED then, not sure if she had gone to [facility] ED but it was not a [facility] facility since no notes in our system. Cecille Aver states she needs something for her hip pain that was the whole reason for being seen yesterday. Cecille Aver was with her this weekend & ibuprofen did not help. Other daughter [name] wanted to know how Aricept was increased, no med was given for hip when she came in for hip. Berna also would like Zofran Rx sent to [facility] for the nausea that she is now having.

## 2016-08-22 ENCOUNTER — Other Ambulatory Visit (INDEPENDENT_AMBULATORY_CARE_PROVIDER_SITE_OTHER): Payer: Medicare Other

## 2016-08-22 DIAGNOSIS — M545 Low back pain, unspecified: Secondary | ICD-10-CM

## 2016-08-22 NOTE — Addendum Note (Signed)
Addended by: [name] on: 08/22/2016 08:19 AM   Modules accepted: Orders

## 2016-08-22 NOTE — Telephone Encounter (Signed)
Called and spoke with pt this morning, she is feeling better now. Will come by for hip xray. Pt gave permission to talk with daughter, gave Dean's phone number [phone]. She says she was with her mom this weekend, mom would cry with pain bending over, pain primarily in her hip. Previously seen by Dr. Charlett Blake, put in referral to ortho. Aricept, namenda added to pts allergy list.

## 2016-09-04 DIAGNOSIS — M7061 Trochanteric bursitis, right hip: Secondary | ICD-10-CM | POA: Diagnosis not present

## 2016-09-04 DIAGNOSIS — M19011 Primary osteoarthritis, right shoulder: Secondary | ICD-10-CM | POA: Diagnosis not present

## 2016-09-07 ENCOUNTER — Telehealth: Payer: Self-pay | Admitting: Pediatrics

## 2016-09-07 NOTE — Telephone Encounter (Signed)
Please advise 

## 2016-09-07 NOTE — Telephone Encounter (Signed)
What symptoms do you have? Nerves are torn up. Can't take any more. She must have surgery in two weeks. She has used xanax in the past.   How long have you been sick? Several days  Have you been seen for this problem? yes  If your provider decides to give you a prescription, which pharmacy would you like for it to be sent to? [facility].   Patient informed that this information will be sent to the clinical staff for review and that they should receive a follow up call.

## 2016-09-10 NOTE — Telephone Encounter (Signed)
Needs to be seen

## 2016-09-10 NOTE — Telephone Encounter (Signed)
appt scheduled Pt notifed 

## 2016-09-11 ENCOUNTER — Other Ambulatory Visit: Payer: Self-pay | Admitting: Family

## 2016-09-11 DIAGNOSIS — K219 Gastro-esophageal reflux disease without esophagitis: Secondary | ICD-10-CM

## 2016-09-12 ENCOUNTER — Ambulatory Visit (INDEPENDENT_AMBULATORY_CARE_PROVIDER_SITE_OTHER): Payer: Medicare Other | Admitting: Pediatrics

## 2016-09-12 ENCOUNTER — Encounter: Payer: Self-pay | Admitting: Pediatrics

## 2016-09-12 VITALS — BP 140/75 | HR 61 | Temp 98.1°F | Ht 64.0 in | Wt 154.0 lb

## 2016-09-12 DIAGNOSIS — F411 Generalized anxiety disorder: Secondary | ICD-10-CM | POA: Diagnosis not present

## 2016-09-12 DIAGNOSIS — F329 Major depressive disorder, single episode, unspecified: Secondary | ICD-10-CM

## 2016-09-12 DIAGNOSIS — I10 Essential (primary) hypertension: Secondary | ICD-10-CM | POA: Diagnosis not present

## 2016-09-12 DIAGNOSIS — F32A Depression, unspecified: Secondary | ICD-10-CM

## 2016-09-12 MED ORDER — ESCITALOPRAM OXALATE 5 MG PO TABS: 5.0000 mg | ORAL_TABLET | Freq: Every day | ORAL | 1 refills | Status: DC

## 2016-09-12 NOTE — Progress Notes (Signed)
  Subjective:   Patient ID: [name], female    DOB: May 09, 1934, 82 y.o.   MRN: [id number] CC: Anxiety  HPI: [name] is a 82 y.o. 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 both legs amputated  Not sleeping at night Son is awake a lot at night, asking for water, for urinal  Says social services was going to help with caregiver   Pain is better in hip Had been scheduled to see orthopedics for hip pain, pt delayed appt with everything happening with son Says hip is not hurting as much as before Taking two tabs naproxen OTC at most in a day  Has been on something for her nerves in the past, says she had been on xanax in the past H/o depression Mood down, feeling safe at home, just tired with extra stressors  Daughter coming to visit this weekend [place] Spaniel it was fine to discuss todays visit with daughter [name]  Relevant past medical, surgical, family and social history reviewed. Allergies and medications reviewed and updated. History  Smoking Status  . Current Some Day Smoker  . Packs/day: 0.10  . Years: 60.00  . Types: Cigarettes  Smokeless Tobacco  . Never Used   ROS: Per HPI   Objective:    BP 140/75   Pulse 61   Temp 98.1 F (36.7 C) (Oral)   Ht 5\' 4"  (1.626 m)   Wt 154 lb (69.9 kg)   BMI 26.43 kg/m   Wt Readings from Last 3 Encounters:  09/12/16 154 lb (69.9 kg)  08/21/16 157 lb (71.2 kg)  08/20/16 157 lb (71.2 kg)    Gen: NAD, alert, cooperative with exam, NCAT EYES: EOMI, no conjunctival injection, or no icterus ENT: OP without erythema LYMPH: no cervical LAD CV: NRRR, normal S1/S2, no murmur, distal pulses 2+ b/l Resp: CTABL, no wheezes, normal WOB Abd: +BS, soft, NTND. no guarding or organomegaly Ext: No edema, warm Neuro: Alert and oriented, strength equal b/l UE and LE, coordination grossly normal MSK: normal muscle bulk  Assessment & Plan:  Valor was seen today for anxiety.  Diagnoses and all orders for this  visit:  GAD (generalized anxiety disorder) Worrying all the time Doesn't remember being on SSRI in the past Any s/e stop medicine and call me Open to vBHI program, will refer pt  -     escitalopram (LEXAPRO) 5 MG tablet; Take 1 tablet (5 mg total) by mouth daily.  Depression, unspecified depression type Cont wellbutrin Start lexapro as above  HTN Slightly elevated, improved with recheck  feeling stressed Taking meds regularly Will check at home, bring to next clinic visit  Follow up plan: Return in about 8 weeks (around 11/07/2016). 01/08/2017, MD Rex Kras [facility]

## 2016-09-26 ENCOUNTER — Encounter: Payer: Medicare Other | Admitting: *Deleted

## 2016-09-26 DIAGNOSIS — Z1231 Encounter for screening mammogram for malignant neoplasm of breast: Secondary | ICD-10-CM | POA: Diagnosis not present

## 2016-10-01 ENCOUNTER — Other Ambulatory Visit: Payer: Self-pay | Admitting: Pediatrics

## 2016-10-01 DIAGNOSIS — J45909 Unspecified asthma, uncomplicated: Secondary | ICD-10-CM

## 2016-10-11 ENCOUNTER — Telehealth: Payer: Self-pay | Admitting: Pediatrics

## 2016-10-25 ENCOUNTER — Encounter: Payer: Self-pay | Admitting: Pediatrics

## 2016-10-25 ENCOUNTER — Ambulatory Visit (INDEPENDENT_AMBULATORY_CARE_PROVIDER_SITE_OTHER): Payer: Medicare Other | Admitting: Pediatrics

## 2016-10-25 VITALS — BP 131/72 | HR 78 | Temp 97.8°F | Ht 64.0 in | Wt 152.8 lb

## 2016-10-25 DIAGNOSIS — M25511 Pain in right shoulder: Secondary | ICD-10-CM

## 2016-10-25 NOTE — Progress Notes (Signed)
  Subjective:   Patient ID: [name], female    DOB: December 09, 1934, 82 y.o.   MRN: [id number] CC: Arm Pain (Right); Shoulder Pain (Right); and Back Pain (Lower)  HPI: [name] is a 82 y.o. female presenting for Arm Pain (Right); Shoulder Pain (Right); and Back Pain (Lower)  Son recently with both legs amputated R shoulder bothering her at night Interested in shoulder injection, helped the last time she received it Can raise both arms over her head No fevers Breathing has been fine Neck ROM remains good  Relevant past medical, surgical, family and social history reviewed. Allergies and medications reviewed and updated. History  Smoking Status  . Current Some Day Smoker  . Packs/day: 0.10  . Years: 60.00  . Types: Cigarettes  Smokeless Tobacco  . Never Used   ROS: Per HPI   Objective:    BP 131/72   Pulse 78   Temp 97.8 F (36.6 C) (Oral)   Ht 5\' 4"  (1.626 m)   Wt 152 lb 12.8 oz (69.3 kg)   BMI 26.23 kg/m   Wt Readings from Last 3 Encounters:  10/25/16 152 lb 12.8 oz (69.3 kg)  09/12/16 154 lb (69.9 kg)  08/21/16 157 lb (71.2 kg)    Gen: NAD, alert, cooperative with exam, NCAT EYES: EOMI, no conjunctival injection, or no icterus CV: NRRR, normal S1/S2 Resp: CTABL, no wheezes, normal WOB Ext: No edema, warm Neuro: Alert and oriented MSK: R shoulder normal to inspection, pain with raising arm over head active ROM Pain with ext rotation R shoulder Normal ROM neck  Assessment & Plan:  [name] was seen today for arm pain, shoulder pain and back pain.  Diagnoses and all orders for this visit:  Right shoulder pain, unspecified chronicity Discussed options, pt wants to proceed with shoulder injection  PROCEDURE Intrarticular Shoulder Injection Verbal consent was obtained from the patient. Risks including infection explained and contrasted with benefits and alternatives. Patient prepped with alcohol wipes. An intraarticular shoulder injection was performed  using the posterior approach. The patient tolerated the procedure well. No complications. Injection: 2 cc of Lidocaine 1% and Kenalog 40 mg. Needle: 22 gauge   Follow up plan: Return in about 3 months (around 01/25/2017). 01/27/2017, MD Rex Kras [facility]

## 2016-11-02 NOTE — Telephone Encounter (Signed)
Patient has been seen since phone call 

## 2016-11-07 ENCOUNTER — Ambulatory Visit: Payer: Medicare Other | Admitting: Pediatrics

## 2016-11-14 ENCOUNTER — Encounter: Payer: Self-pay | Admitting: Family Medicine

## 2016-11-14 ENCOUNTER — Ambulatory Visit (INDEPENDENT_AMBULATORY_CARE_PROVIDER_SITE_OTHER): Payer: Medicare Other | Admitting: Family Medicine

## 2016-11-14 VITALS — BP 142/78 | HR 69 | Temp 98.1°F | Ht 64.0 in | Wt 152.2 lb

## 2016-11-14 DIAGNOSIS — J069 Acute upper respiratory infection, unspecified: Secondary | ICD-10-CM

## 2016-11-14 MED ORDER — BENZONATATE 200 MG PO CAPS: 200.0000 mg | ORAL_CAPSULE | Freq: Two times a day (BID) | ORAL | 0 refills | Status: DC | PRN

## 2016-11-14 NOTE — Progress Notes (Signed)
S: 1 week history of some upper respiratory congestion. She has not had any fever. She does complain of some sinus drainage. Cough is productive of clear sputum in general. She adds it Tessalon usually helps this type problem.  O: Patient is nontoxic appearing. HEENT: TMs are clear with normal landmarks Throat clear there is some evidence of some clear drainage. Neck supple without significant adenopathy Sinuses: Nontender to percussion Chest: Lungs are clear to percussion and auscultation  A: Upper respiratory infection  P: Will treat symptomatically with Tessalon which has been effective before. Instructed to let us know if she develops darker sputum or fever

## 2016-11-14 NOTE — Progress Notes (Signed)
Subjective:    Patient ID: [name], female    DOB: [birth year], 82 y.o.   MRN: [id number]  HPI  Patient Active Problem List   Diagnosis Date Noted  . Constipation 04/11/2016  . Chronic systolic CHF (congestive heart failure) (HCC) 04/07/2016  . SBO (small bowel obstruction) (HCC) 04/05/2016  . Elevated troponin 04/05/2016  . Hyperglycemia 04/05/2016  . Syncope 11/15/2015  . Nausea and vomiting 11/15/2015  . Dehydration 10/05/2015  . UTI (lower urinary tract infection) 10/05/2015  . Dementia 09/13/2015  . Memory problem 05/07/2015  . COPD (chronic obstructive pulmonary disease) (HCC) 04/19/2015  . Neck pain 04/06/2015  . Insomnia 12/10/2014  . BMI 28.0-28.9,adult 12/10/2014  . Osteopenia 05/19/2014  . GAD (generalized anxiety disorder) 01/06/2014  . Depression 01/06/2014  . GERD (gastroesophageal reflux disease) 01/06/2014  . Vitamin D deficiency 01/06/2014  . Overactive bladder 01/06/2014  . Secondary cardiomyopathy (HCC) 10/06/2013  . Left bundle branch block 10/06/2013  . Dizziness 10/04/2013  . Osteoarthritis of right hip 03/14/2013  . Cough 02/01/2011  . Reflux 02/01/2011  . Allergic rhinitis 12/07/2008  . Chronic cough 12/07/2008  . Hepatitis B virus infection 08/16/2008  . Hyperlipidemia 08/16/2008  . Essential hypertension 08/16/2008  . DISC DEGENERATION 07/24/2007   Outpatient Encounter Prescriptions as of 11/14/2016  Medication Sig  . albuterol (PROVENTIL HFA;VENTOLIN HFA) 108 (90 Base) MCG/ACT inhaler Inhale 2 puffs into the lungs every 6 (six) hours as needed for wheezing or shortness of breath.  Marland Kitchen aspirin EC 81 MG tablet Take 81 mg by mouth daily.  Marland Kitchen buPROPion (WELLBUTRIN SR) 150 MG 12 hr tablet TAKE (1) TABLET TWICE A DAY.  . carvedilol (COREG) 6.25 MG tablet Take 1 tablet 2 (two) times daily with a meal.  . DYMISTA 137-50 MCG/ACT SUSP One spray per nostril twice a day  . escitalopram (LEXAPRO) 5 MG tablet Take 1 tablet (5 mg total) by mouth  daily.  Marland Kitchen esomeprazole (NEXIUM) 40 MG capsule Take 1 capsule (40 mg total) by mouth daily before breakfast.  . ibuprofen (ADVIL,MOTRIN) 600 MG tablet Take 1 tablet (600 mg total) by mouth every 8 (eight) hours as needed.  . loratadine (CLARITIN) 10 MG tablet Take 1 tablet (10 mg total) by mouth daily.  Marland Kitchen losartan (COZAAR) 50 MG tablet Take 0.5 tablets (25 mg total) by mouth daily.  . Multiple Vitamins-Minerals (CENTRUM SILVER PO) Take 1 tablet by mouth daily.  . ondansetron (ZOFRAN ODT) 8 MG disintegrating tablet Take 1 tablet (8 mg total) by mouth every 8 (eight) hours as needed for nausea or vomiting.  . ondansetron (ZOFRAN) 4 MG tablet Take 1 tablet every 8 hours as needed for nausea or vomiting.  . polyethylene glycol powder (GLYCOLAX/MIRALAX) powder Take 17 g by mouth 2 (two) times daily as needed.  . raloxifene (EVISTA) 60 MG tablet Take 1 tablet (60 mg total) by mouth daily.  . SYMBICORT 160-4.5 MCG/ACT inhaler Inhale 2 puffs into the lungs 2 (two) times daily.  Marland Kitchen triamcinolone (KENALOG) 0.025 % ointment Apply 1 application topically 2 (two) times daily.   No facility-administered encounter medications on file as of 11/14/2016.       Review of Systems     Objective:   Physical Exam BP (!) 142/78   Pulse 69   Temp 98.1 F (36.7 C) (Oral)   Ht 5\' 4"  (1.626 m)   Wt 152 lb 3.2 oz (69 kg)   BMI 26.13 kg/m         Assessment &  Plan:

## 2016-11-16 ENCOUNTER — Telehealth: Payer: Self-pay | Admitting: Pediatrics

## 2016-11-16 MED ORDER — AZITHROMYCIN 250 MG PO TABS: ORAL_TABLET | ORAL | 0 refills | Status: DC

## 2016-11-16 NOTE — Telephone Encounter (Signed)
rx sent to pharmacy and pt aware. 

## 2016-11-16 NOTE — Telephone Encounter (Signed)
Patient of Oswaldo Done. Will you review and advise?

## 2016-11-16 NOTE — Addendum Note (Signed)
Addended by: Margurite Auerbach on: 11/16/2016 01:09 PM   Modules accepted: Orders

## 2016-11-16 NOTE — Telephone Encounter (Signed)
Okay to call in a Z-Pak

## 2016-11-22 ENCOUNTER — Encounter: Payer: Self-pay | Admitting: Pediatrics

## 2016-11-22 ENCOUNTER — Ambulatory Visit (INDEPENDENT_AMBULATORY_CARE_PROVIDER_SITE_OTHER): Payer: Medicare Other | Admitting: Pediatrics

## 2016-11-22 ENCOUNTER — Ambulatory Visit (INDEPENDENT_AMBULATORY_CARE_PROVIDER_SITE_OTHER): Payer: Medicare Other

## 2016-11-22 VITALS — BP 126/72 | HR 68 | Temp 97.3°F | Ht 64.0 in | Wt 155.0 lb

## 2016-11-22 DIAGNOSIS — G8929 Other chronic pain: Secondary | ICD-10-CM

## 2016-11-22 DIAGNOSIS — M25511 Pain in right shoulder: Secondary | ICD-10-CM

## 2016-11-22 NOTE — Progress Notes (Signed)
  Subjective:   Patient ID: [name], female    DOB: 04-28-1935, 82 y.o.   MRN: [id number] CC: Shoulder Pain (Right)  HPI: [name] is a 82 y.o. female presenting for Shoulder Pain (Right)  Had steroid  injection in shoulder 4 weeks ago Helped a lot Past two weeks pain has increased No change in activity Bothers her to raise over head Hurts most in anterior shoulder  Taking ibuprofen once or twice a day Helps most at night Shoulder doesn't hurt unless she moes it Has been pushing son in wheelchair regularly out onto their porch, says it doesn't hurt when she pushes the wheelchair Had rotator cuff repair years ago on the shoulder  Relevant past medical, surgical, family and social history reviewed. Allergies and medications reviewed and updated. History  Smoking Status  . Current Some Day Smoker  . Packs/day: 0.10  . Years: 60.00  . Types: Cigarettes  Smokeless Tobacco  . Never Used   ROS: Per HPI   Objective:    BP 126/72   Pulse 68   Temp (!) 97.3 F (36.3 C) (Oral)   Ht 5\' 4"  (1.626 m)   Wt 155 lb (70.3 kg)   BMI 26.61 kg/m   Wt Readings from Last 3 Encounters:  11/22/16 155 lb (70.3 kg)  11/14/16 152 lb 3.2 oz (69 kg)  10/25/16 152 lb 12.8 oz (69.3 kg)    Gen: NAD, alert, cooperative with exam, NCAT EYES: EOMI, no conjunctival injection, or no icterus CV: NRRR, normal S1/S2, no murmur, distal pulses 2+ b/l Resp: CTABL, no wheezes, normal WOB Abd: +BS, soft, NTND Ext: No edema, warm Neuro: Alert and oriented, strength equal b/l UE and LE, coordination grossly normal MSK: pain with palpation ant R shoulder No pain of AC joint Pain with active ROM, raising arm up to apprx 120 degrees Able to raise up to 150 degrees without pain passively  Assessment & Plan:  [name] was seen today for shoulder pain.  Diagnoses and all orders for this visit:  Chronic right shoulder pain Cont inbuprofen 1-2 times a day prn Gentle ROM exercises Refer to  ortho -     DG Shoulder Right; Future -     Ambulatory referral to Orthopedic Surgery   Follow up plan: Return in about 3 months (around 02/22/2017) for med follow up. 02/24/2017, MD Rex K[facility]

## 2016-11-23 ENCOUNTER — Telehealth: Payer: Self-pay | Admitting: Pediatrics

## 2016-11-23 NOTE — Telephone Encounter (Signed)
Pt aware.

## 2016-12-12 ENCOUNTER — Other Ambulatory Visit: Payer: Self-pay | Admitting: Pediatrics

## 2016-12-12 DIAGNOSIS — M810 Age-related osteoporosis without current pathological fracture: Secondary | ICD-10-CM

## 2016-12-12 DIAGNOSIS — Z87891 Personal history of nicotine dependence: Secondary | ICD-10-CM

## 2016-12-12 DIAGNOSIS — F411 Generalized anxiety disorder: Secondary | ICD-10-CM

## 2016-12-24 ENCOUNTER — Ambulatory Visit (INDEPENDENT_AMBULATORY_CARE_PROVIDER_SITE_OTHER): Payer: Medicare Other | Admitting: Pediatrics

## 2016-12-24 ENCOUNTER — Encounter: Payer: Self-pay | Admitting: Pediatrics

## 2016-12-24 ENCOUNTER — Ambulatory Visit: Payer: Medicare Other | Admitting: Family Medicine

## 2016-12-24 VITALS — BP 137/77 | HR 71 | Temp 99.4°F | Resp 18 | Ht 64.0 in | Wt 156.6 lb

## 2016-12-24 DIAGNOSIS — I10 Essential (primary) hypertension: Secondary | ICD-10-CM | POA: Diagnosis not present

## 2016-12-24 DIAGNOSIS — J4531 Mild persistent asthma with (acute) exacerbation: Secondary | ICD-10-CM

## 2016-12-24 DIAGNOSIS — J45909 Unspecified asthma, uncomplicated: Secondary | ICD-10-CM

## 2016-12-24 MED ORDER — PREDNISONE 20 MG PO TABS: ORAL_TABLET | ORAL | 0 refills | Status: DC

## 2016-12-24 MED ORDER — ALBUTEROL SULFATE HFA 108 (90 BASE) MCG/ACT IN AERS: 2.0000 | INHALATION_SPRAY | Freq: Four times a day (QID) | RESPIRATORY_TRACT | 2 refills | Status: DC | PRN

## 2016-12-24 MED ORDER — BUDESONIDE-FORMOTEROL FUMARATE 160-4.5 MCG/ACT IN AERO: 2.0000 | INHALATION_SPRAY | Freq: Two times a day (BID) | RESPIRATORY_TRACT | 4 refills | Status: DC

## 2016-12-24 MED ORDER — AZITHROMYCIN 250 MG PO TABS: ORAL_TABLET | ORAL | 0 refills | Status: DC

## 2016-12-24 NOTE — Progress Notes (Addendum)
  Subjective:   Patient ID: [name], female    DOB: 1934/06/02, 82 y.o.   MRN: [id number] CC: Cough and Nasal Congestion  HPI: [name] is a 82 y.o. female presenting for Cough and Nasal Congestion  Started about three days ago Has been using symbicort regularly since then  Doesn't have albuterol at home Has h/o asthma Treated apprx 1 mo ago with azithromycin No fevers Feels SOB with coughing, more mucus production than before Appetite ok, down some  No CP  Relevant past medical, surgical, family and social history reviewed. Allergies and medications reviewed and updated. History  Smoking Status  . Current Some Day Smoker  . Packs/day: 0.10  . Years: 60.00  . Types: Cigarettes  Smokeless Tobacco  . Never Used   ROS: Per HPI   Objective:    BP 137/77   Pulse 71   Temp 99.4 F (37.4 C) (Oral)   Resp 18   Ht 5\' 4"  (1.626 m)   Wt 156 lb 9.6 oz (71 kg)   SpO2 98%   BMI 26.88 kg/m   Wt Readings from Last 3 Encounters:  12/24/16 156 lb 9.6 oz (71 kg)  11/22/16 155 lb (70.3 kg)  11/14/16 152 lb 3.2 oz (69 kg)    Gen: NAD, alert, cooperative with exam, NCAT, dry cough EYES: EOMI, no conjunctival injection, or no icterus ENT:  TMs pearly gray b/l, OP without erythema LYMPH: no cervical LAD CV: NRRR, normal S1/S2, no murmur, distal pulses 2+ b/l Resp: air movement slightly decreased, slight wheeze with forced exhalation, comfortable WOB, no rales Ext: No edema, warm Neuro: Alert and oriented MSK: normal muscle bulk  Assessment & Plan:  [name] was seen today for cough and nasal congestion.  Diagnoses and all orders for this visit:  Mild persistent asthma with exacerbation -     budesonide-formoterol (SYMBICORT) 160-4.5 MCG/ACT inhaler; Inhale 2 puffs into the lungs 2 (two) times daily. -     albuterol (PROVENTIL HFA;VENTOLIN HFA) 108 (90 Base) MCG/ACT inhaler; Inhale 2 puffs into the lungs every 6 (six) hours as needed for wheezing or shortness of  breath. -     predniSONE (DELTASONE) 20 MG tablet; 2 po at same time daily for 3 days  Start below if cough worsening next few days -     azithromycin (ZITHROMAX) 250 MG tablet; Take 2 the first day and then one each day after.  HTN Adequate control, cont current medication  Follow up plan: Return if symptoms worsen or fail to improve. [name], MD Rex K[facility]

## 2016-12-27 ENCOUNTER — Ambulatory Visit: Payer: Medicare Other | Admitting: Pediatrics

## 2016-12-27 ENCOUNTER — Telehealth: Payer: Self-pay | Admitting: *Deleted

## 2016-12-27 ENCOUNTER — Other Ambulatory Visit: Payer: Self-pay | Admitting: *Deleted

## 2016-12-27 DIAGNOSIS — G8929 Other chronic pain: Secondary | ICD-10-CM | POA: Insufficient documentation

## 2016-12-27 DIAGNOSIS — M25511 Pain in right shoulder: Principal | ICD-10-CM

## 2016-12-27 NOTE — Telephone Encounter (Signed)
Referral to ortho placed for Chronic Right shoulder pain

## 2017-01-02 ENCOUNTER — Other Ambulatory Visit: Payer: Self-pay | Admitting: Family

## 2017-01-02 DIAGNOSIS — R609 Edema, unspecified: Secondary | ICD-10-CM

## 2017-01-04 ENCOUNTER — Ambulatory Visit (INDEPENDENT_AMBULATORY_CARE_PROVIDER_SITE_OTHER): Payer: Medicare Other | Admitting: Orthopedic Surgery

## 2017-01-09 ENCOUNTER — Telehealth: Payer: Self-pay | Admitting: Pediatrics

## 2017-01-09 NOTE — Telephone Encounter (Signed)
Per pt she is taking Losartan 25mg  daily

## 2017-01-10 ENCOUNTER — Other Ambulatory Visit: Payer: Self-pay | Admitting: Pediatrics

## 2017-01-10 DIAGNOSIS — Z87891 Personal history of nicotine dependence: Secondary | ICD-10-CM

## 2017-01-10 DIAGNOSIS — F411 Generalized anxiety disorder: Secondary | ICD-10-CM

## 2017-01-11 ENCOUNTER — Ambulatory Visit (INDEPENDENT_AMBULATORY_CARE_PROVIDER_SITE_OTHER): Payer: Medicare Other | Admitting: Orthopedic Surgery

## 2017-01-17 ENCOUNTER — Encounter: Payer: Self-pay | Admitting: Nurse Practitioner

## 2017-01-17 ENCOUNTER — Ambulatory Visit (INDEPENDENT_AMBULATORY_CARE_PROVIDER_SITE_OTHER): Payer: Medicare Other | Admitting: Orthopedic Surgery

## 2017-01-17 ENCOUNTER — Ambulatory Visit (INDEPENDENT_AMBULATORY_CARE_PROVIDER_SITE_OTHER): Payer: Medicare Other | Admitting: Nurse Practitioner

## 2017-01-17 VITALS — BP 142/77 | HR 68 | Temp 97.5°F | Ht 64.0 in | Wt 153.0 lb

## 2017-01-17 DIAGNOSIS — R5383 Other fatigue: Secondary | ICD-10-CM | POA: Diagnosis not present

## 2017-01-17 DIAGNOSIS — Z79899 Other long term (current) drug therapy: Secondary | ICD-10-CM | POA: Diagnosis not present

## 2017-01-17 DIAGNOSIS — M779 Enthesopathy, unspecified: Secondary | ICD-10-CM | POA: Diagnosis not present

## 2017-01-17 DIAGNOSIS — R42 Dizziness and giddiness: Secondary | ICD-10-CM

## 2017-01-17 DIAGNOSIS — M79675 Pain in left toe(s): Secondary | ICD-10-CM | POA: Diagnosis not present

## 2017-01-17 MED ORDER — MECLIZINE HCL 25 MG PO TABS: 25.0000 mg | ORAL_TABLET | Freq: Three times a day (TID) | ORAL | 0 refills | Status: DC | PRN

## 2017-01-17 NOTE — Patient Instructions (Signed)

## 2017-01-17 NOTE — Progress Notes (Signed)
   Subjective:    Patient ID: [name], female    DOB: [birth year], 82 y.o.   MRN: [id number]  HPI  Patient come sin today c/o fatigue and dizziness. She denies any chest pain. She has SOB from her COPD and she has ran out of symbicort. SHe has been taking care of her son who just had bith legs amputated and she has not been sleeping as well as she use to.    Review of Systems  Constitutional: Positive for activity change (taking care of sick son) and fatigue. Negative for appetite change, chills and fever.  HENT: Negative.   Respiratory: Positive for shortness of breath.   Cardiovascular: Negative for chest pain, palpitations and leg swelling.  Gastrointestinal: Negative.   Genitourinary: Negative.   Neurological: Positive for dizziness and weakness. Negative for headaches.  Psychiatric/Behavioral: Negative.        Objective:   Physical Exam  Constitutional: She is oriented to person, place, and time. She appears well-developed and well-nourished. She appears distressed (mild).  Cardiovascular: Normal rate and regular rhythm.   Pulmonary/Chest: Effort normal and breath sounds normal.  Abdominal: Soft. Bowel sounds are normal.  Neurological: She is alert and oriented to person, place, and time. She has normal reflexes. No cranial nerve deficit.  Skin: Skin is warm.  Psychiatric: She has a normal mood and affect. Her behavior is normal. Judgment and thought content normal.     BP (!) 142/77   Pulse 68   Temp (!) 97.5 F (36.4 C) (Oral)   Ht '5\' 4"'$  (1.626 m)   Wt 153 lb (69.4 kg)   BMI 26.26 kg/m       Assessment & Plan:   1. Fatigue, unspecified type   2. Vertigo    Meds ordered this encounter  Medications  . meclizine (ANTIVERT) 25 MG tablet    Sig: Take 1 tablet (25 mg total) by mouth 3 (three) times daily as needed for dizziness.    Dispense:  30 tablet    Refill:  0    Order Specific Question:   Supervising Provider    Answer:   Evette Doffing, CAROL L [[id number]]    Orders Placed This Encounter  Procedures  . Anemia Profile B  . CMP14+EGFR  . Thyroid Panel With TSH  . Vitamin B12   Rest  Force fluids Labs pending  [name] Hassell Done, FNP

## 2017-01-18 ENCOUNTER — Other Ambulatory Visit: Payer: Self-pay | Admitting: Nurse Practitioner

## 2017-01-18 ENCOUNTER — Ambulatory Visit: Payer: Medicare Other | Admitting: Pediatrics

## 2017-01-18 ENCOUNTER — Other Ambulatory Visit: Payer: Self-pay | Admitting: Family

## 2017-01-18 DIAGNOSIS — I10 Essential (primary) hypertension: Secondary | ICD-10-CM

## 2017-01-18 LAB — ANEMIA PROFILE B
BASOS: 0 %
Basophils Absolute: 0 10*3/uL (ref 0.0–0.2)
EOS (ABSOLUTE): 0.2 10*3/uL (ref 0.0–0.4)
Eos: 2 %
FERRITIN: 69 ng/mL (ref 15–150)
Folate: 19.7 ng/mL (ref >3.0)
HEMATOCRIT: 41 % (ref 34.0–46.6)
HEMOGLOBIN: 13.5 g/dL (ref 11.1–15.9)
IMMATURE GRANS (ABS): 0 10*3/uL (ref 0.0–0.1)
IMMATURE GRANULOCYTES: 0 %
IRON SATURATION: 45 % (ref 15–55)
Iron: 158 ug/dL — ABNORMAL HIGH (ref 27–139)
LYMPHS: 33 %
Lymphocytes Absolute: 2.6 10*3/uL (ref 0.7–3.1)
MCH: 30.5 pg (ref 26.6–33.0)
MCHC: 32.9 g/dL (ref 31.5–35.7)
MCV: 93 fL (ref 79–97)
MONOCYTES: 6 %
Monocytes Absolute: 0.5 10*3/uL (ref 0.1–0.9)
Neutrophils Absolute: 4.6 10*3/uL (ref 1.4–7.0)
Neutrophils: 59 %
Platelets: 320 10*3/uL (ref 150–379)
RBC: 4.43 x10E6/uL (ref 3.77–5.28)
RDW: 14.3 % (ref 12.3–15.4)
RETIC CT PCT: 1.3 % (ref 0.6–2.6)
Total Iron Binding Capacity: 354 ug/dL (ref 250–450)
UIBC: 196 ug/dL (ref 118–369)
Vitamin B-12: >2000 pg/mL — ABNORMAL HIGH (ref 232–1245)
WBC: 7.9 10*3/uL (ref 3.4–10.8)

## 2017-01-18 LAB — CMP14+EGFR
A/G RATIO: 1.7 (ref 1.2–2.2)
ALBUMIN: 4 g/dL (ref 3.5–4.8)
ALT: 16 IU/L (ref 0–32)
AST: 22 IU/L (ref 0–40)
Alkaline Phosphatase: 99 IU/L (ref 39–117)
BUN / CREAT RATIO: 13 (ref 12–28)
BUN: 14 mg/dL (ref 8–27)
Bilirubin Total: 0.3 mg/dL (ref 0.0–1.2)
CALCIUM: 9.3 mg/dL (ref 8.7–10.3)
CO2: 23 mmol/L (ref 20–29)
Chloride: 109 mmol/L — ABNORMAL HIGH (ref 96–106)
Creatinine, Ser: 1.09 mg/dL — ABNORMAL HIGH (ref 0.57–1.00)
GFR, EST AFRICAN AMERICAN: 56 mL/min/{1.73_m2} — AB (ref >59)
GFR, EST NON AFRICAN AMERICAN: 48 mL/min/{1.73_m2} — AB (ref >59)
GLOBULIN, TOTAL: 2.4 g/dL (ref 1.5–4.5)
Glucose: 69 mg/dL (ref 65–99)
POTASSIUM: 4.3 mmol/L (ref 3.5–5.2)
SODIUM: 145 mmol/L — AB (ref 134–144)
TOTAL PROTEIN: 6.4 g/dL (ref 6.0–8.5)

## 2017-01-18 LAB — THYROID PANEL WITH TSH
Free Thyroxine Index: 2.2 (ref 1.2–4.9)
T3 UPTAKE RATIO: 27 % (ref 24–39)
T4, Total: 8 ug/dL (ref 4.5–12.0)
TSH: 0.638 u[IU]/mL (ref 0.450–4.500)

## 2017-01-18 MED ORDER — ONDANSETRON HCL 4 MG PO TABS: ORAL_TABLET | ORAL | 0 refills | Status: DC

## 2017-01-18 NOTE — Progress Notes (Signed)
zofran

## 2017-01-31 ENCOUNTER — Ambulatory Visit (INDEPENDENT_AMBULATORY_CARE_PROVIDER_SITE_OTHER): Payer: Medicare Other | Admitting: Orthopedic Surgery

## 2017-02-04 ENCOUNTER — Ambulatory Visit: Payer: Medicare Other | Admitting: Pediatrics

## 2017-02-06 ENCOUNTER — Encounter: Payer: Self-pay | Admitting: Pediatrics

## 2017-02-08 ENCOUNTER — Ambulatory Visit (INDEPENDENT_AMBULATORY_CARE_PROVIDER_SITE_OTHER): Payer: Medicare Other | Admitting: Orthopedic Surgery

## 2017-02-12 DIAGNOSIS — L298 Other pruritus: Secondary | ICD-10-CM | POA: Diagnosis not present

## 2017-02-12 DIAGNOSIS — L658 Other specified nonscarring hair loss: Secondary | ICD-10-CM | POA: Diagnosis not present

## 2017-02-12 DIAGNOSIS — B078 Other viral warts: Secondary | ICD-10-CM | POA: Diagnosis not present

## 2017-02-14 ENCOUNTER — Encounter: Payer: Self-pay | Admitting: *Deleted

## 2017-02-14 ENCOUNTER — Ambulatory Visit (INDEPENDENT_AMBULATORY_CARE_PROVIDER_SITE_OTHER): Payer: Medicare Other | Admitting: *Deleted

## 2017-02-14 ENCOUNTER — Telehealth: Payer: Self-pay | Admitting: *Deleted

## 2017-02-14 ENCOUNTER — Ambulatory Visit (INDEPENDENT_AMBULATORY_CARE_PROVIDER_SITE_OTHER): Payer: Medicare Other

## 2017-02-14 VITALS — BP 169/87 | HR 83 | Ht 62.0 in | Wt 154.0 lb

## 2017-02-14 DIAGNOSIS — M8580 Other specified disorders of bone density and structure, unspecified site: Secondary | ICD-10-CM | POA: Diagnosis not present

## 2017-02-14 DIAGNOSIS — M8588 Other specified disorders of bone density and structure, other site: Secondary | ICD-10-CM | POA: Diagnosis not present

## 2017-02-14 DIAGNOSIS — Z0000 Encounter for general adult medical examination without abnormal findings: Secondary | ICD-10-CM

## 2017-02-14 NOTE — Patient Instructions (Addendum)
  [name] , Thank you for taking time to come for your Medicare Wellness Visit. I appreciate your ongoing commitment to your health goals. Please review the following plan we discussed and let me know if I can assist you in the future.   These are the goals we discussed: Goals    . Exercise 150 minutes per week (moderate activity)          Walk for 30 minutes daily       This is a list of the screening recommended for you and due dates:  Health Maintenance  Topic Date Due  . DEXA scan (bone density measurement)  05/19/2016  . Tetanus Vaccine  11/28/2016  . Flu Shot  Completed  . Pneumonia vaccines  Completed   Remember to take time for yourself each day. Walking for 30 minutes is a good way to exercise and is great for stress relief and depression.  Talk with Dr Oswaldo Done at your next visit about your depression and possible medication changes.  Review Advance Directives. Bring a signed notarized copy to our office if you decide to complete the form. I will check to see what type of care might be available for your son.   The pain in your head has been diagnosed as occipital neuralgia on the left side.

## 2017-02-14 NOTE — Progress Notes (Signed)
Subjective:   [name] is a 82 y.o. female who presents for an Initial Medicare Annual Wellness Visit. [name] lives at home and is the primary caregiver for her adult son who has diabetes and recently had both legs amputated due to complications. She has 3 daughters and had 2 sons. One son died in a MVA years ago. Her daughters all live out of the state and they call and check on her frequently but she doesn't have a good support system locally.   Review of Systems    Health is about the same as last year.   Neurological: Left occipital headaches diagnosed as occipital neuralgia. Notices that they worsen when anxious or agitated. They improve if she lays down to rest. She has been seen in the ED for this more than a year ago and has followed up with her PCP. Her daughter, [name], is especially concerned about these.  Cardiac Risk Factors include: advanced age (>83men, >17 women);sedentary lifestyle;smoking/ tobacco exposure;dyslipidemia  Other systems negative today.     Objective:    Today's Vitals   02/14/17 0903  BP: (!) 169/87  Pulse: 83  Weight: 154 lb (69.9 kg)  Height: 5\' 2"  (1.575 m)   Body mass index is 28.17 kg/m.   Current Medications (verified) Outpatient Encounter Prescriptions as of 02/14/2017  Medication Sig  . albuterol (PROVENTIL HFA;VENTOLIN HFA) 108 (90 Base) MCG/ACT inhaler Inhale 2 puffs into the lungs every 6 (six) hours as needed for wheezing or shortness of breath.  02/16/2017 aspirin EC 81 MG tablet Take 81 mg by mouth daily.  . budesonide-formoterol (SYMBICORT) 160-4.5 MCG/ACT inhaler Inhale 2 puffs into the lungs 2 (two) times daily.  Marland Kitchen buPROPion (WELLBUTRIN SR) 150 MG 12 hr tablet TAKE (1) TABLET TWICE A DAY.  . carvedilol (COREG) 6.25 MG tablet Take 1 tablet 2 (two) times daily with a meal.  . DYMISTA 137-50 MCG/ACT SUSP One spray per nostril twice a day  . escitalopram (LEXAPRO) 5 MG tablet TAKE 1 TABLET ONCE DAILY  . esomeprazole (NEXIUM)  40 MG capsule Take 1 capsule (40 mg total) by mouth daily before breakfast.  . furosemide (LASIX) 20 MG tablet Take 1/2 tablet (10 mg total) by mouth daily.  Marland Kitchen ibuprofen (ADVIL,MOTRIN) 600 MG tablet Take 1 tablet (600 mg total) by mouth every 8 (eight) hours as needed.  . loratadine (CLARITIN) 10 MG tablet Take 1 tablet (10 mg total) by mouth daily.  Marland Kitchen losartan (COZAAR) 50 MG tablet Take 1/2 tablet (25 mg total) by mouth daily.  . meclizine (ANTIVERT) 25 MG tablet Take 1 tablet (25 mg total) by mouth 3 (three) times daily as needed for dizziness.  . Multiple Vitamins-Minerals (CENTRUM SILVER PO) Take 1 tablet by mouth daily.  . ondansetron (ZOFRAN) 4 MG tablet Take 1 tablet every 8 hours as needed for nausea or vomiting.  . polyethylene glycol powder (GLYCOLAX/MIRALAX) powder Take 17 g by mouth 2 (two) times daily as needed.  . raloxifene (EVISTA) 60 MG tablet TAKE 1 TABLET DAILY  . triamcinolone (KENALOG) 0.025 % ointment Apply 1 application topically 2 (two) times daily.  . [DISCONTINUED] azithromycin (ZITHROMAX) 250 MG tablet Take 2 the first day and then one each day after.  . [DISCONTINUED] ondansetron (ZOFRAN ODT) 8 MG disintegrating tablet Take 1 tablet (8 mg total) by mouth every 8 (eight) hours as needed for nausea or vomiting. (Patient not taking: Reported on 01/17/2017)   No facility-administered encounter medications on file  as of 02/14/2017.     Allergies (verified) Lisinopril-hydrochlorothiazide; Codeine; Penicillins; Aricept [donepezil hcl]; and Namenda [memantine hcl]   History: Past Medical History:  Diagnosis Date  . Allergic rhinitis   . Anxiety   . Arthritis   . Cataract    had eye surgery  . Depression   . Essential hypertension, benign   . GERD (gastroesophageal reflux disease)   . History of blood transfusion   . Left bundle branch block   . Memory loss   . Mixed hyperlipidemia   . Occipital neuralgia of left side   . Osteopenia   . Secondary cardiomyopathy  (HCC)    LVEF 40-45%, likely nonischemic  . Urinary urgency    Past Surgical History:  Procedure Laterality Date  . APPENDECTOMY    . BACK SURGERY    . CARDIAC CATHETERIZATION  2010   Normal coronaries  . CATARACT EXTRACTION W/PHACO  04/16/2011   Procedure: CATARACT EXTRACTION PHACO AND INTRAOCULAR LENS PLACEMENT (IOC);  Surgeon: Susa Simmonds;  Location: [facility];  Service: Ophthalmology;  Laterality: Left;  CDE=11.35  . CATARACT EXTRACTION W/PHACO  01/28/2012   Procedure: CATARACT EXTRACTION PHACO AND INTRAOCULAR LENS PLACEMENT (IOC);  Surgeon: Susa Simmonds, MD;  Location: [facility];  Service: Ophthalmology;  Laterality: Right;  CDI:8.15  . CYSTO WITH HYDRODISTENSION  05/29/2012   Procedure: CYSTOSCOPY/HYDRODISTENSION;  Surgeon: Martina Sinner, MD;  Location: [facility];  Service: Urology;  Laterality: N/A;  INSTILLATION OF MARCAINE AND PYRIDIUM   . DILATION AND CURETTAGE OF UTERUS  ?<hysterectomy"  . HAMMER TOE SURGERY Bilateral 11/22/11   [facility], [name]  . JOINT REPLACEMENT    . LAPAROSCOPIC CHOLECYSTECTOMY  1991  . LUMBAR LAMINECTOMY  2011  . LUMBAR LAMINECTOMY/DECOMPRESSION MICRODISCECTOMY Left 06/06/2012   Procedure: LUMBAR LAMINECTOMY/DECOMPRESSION MICRODISCECTOMY 1 LEVEL;  Surgeon: Maeola Harman, MD;  Location: [facility];  Service: Neurosurgery;  Laterality: Left;  Left Lumbar two-three Laminectomy for resection of synovial cyst  . SHOULDER OPEN ROTATOR CUFF REPAIR Right 2011  . TONSILLECTOMY    . TOTAL ABDOMINAL HYSTERECTOMY    . TOTAL HIP ARTHROPLASTY Right 03/13/2013   Procedure: TOTAL HIP ARTHROPLASTY;  Surgeon: Nestor Lewandowsky, MD;  Location: [facility];  Service: Orthopedics;  Laterality: Right;   Family History  Problem Relation Age of Onset  . Diabetes Father   . Diabetes Brother   . Cancer Brother 21       colon  . Diabetes Brother   . Diabetes Brother   . Hypertension Sister   . Diabetes Sister   . Diabetes Sister   . Diabetes Other   . Allergies  Other   . Diabetes Son   . Colon cancer Neg Hx   . Anesthesia problems Neg Hx   . Hypotension Neg Hx   . Malignant hyperthermia Neg Hx   . Pseudochol deficiency Neg Hx    Social History   Occupational History  . 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 Part time care giver    Social History Main Topics  . Smoking status: Current Some Day Smoker    Packs/day: 0.10    Years: 60.00    Types: Cigarettes  . Smokeless tobacco: Never Used     Comment: Smokes a cigarette maybe once a week at the most when she feel anxious  . Alcohol use No  . Drug use: No  . Sexual activity: No    Tobacco Counseling Smokes about  1 to 2 cigarettes a week when she feels anxious. She is not ready to completely quit at this time.   Activities of Daily Living In your present state of health, do you have any difficulty performing the following activities: 02/14/2017 04/05/2016  Hearing? N N  Vision? N N  Difficulty concentrating or making decisions? Malvin JohnsY Y  Comment has noticed a decline in memory over the years. Has some trouble remembering names and what she's going to get sometime -  Walking or climbing stairs? N N  Dressing or bathing? N N  Doing errands, shopping? N N  Preparing Food and eating ? N -  Using the Toilet? N -  In the past six months, have you accidently leaked urine? N -  Do you have problems with loss of bowel control? N -  Managing your Medications? N -  Comment pharmacy has medications delivered in a pill pack organized as morning and night -  Managing your Finances? N -  Housekeeping or managing your Housekeeping? N -  Comment Takes care of son who has diabetes and has BLE amputations. She would like some assistance with him -  Some recent data might be hidden    Immunizations and Health Maintenance Immunization History  Administered Date(s) Administered  . Influenza Split 01/29/2011, 01/29/2012, 01/26/2013  . Influenza, High Dose Seasonal PF  02/15/2015, 01/23/2016  . Influenza,inj,Quad PF,6+ Mos 12/07/2013  . Pneumococcal Conjugate-13 07/07/2014  . Pneumococcal Polysaccharide-23 01/28/2010  . Zoster 10/25/2011   Health Maintenance Due  Topic Date Due  . TETANUS/TDAP  11/28/2016    Patient Care Team: Johna SheriffVincent, Carol L, MD as PCP - General (Pediatrics) Rachael FeeJacobs, Daniel P, MD (Gastroenterology) Rollene RotundaHochrein, [name], MD as Consulting Physician (Cardiology) Alfredo MartinezMacDiarmid, Scott, MD as Consulting Physician (Urology) Kalman Shanamaswamy, Murali, MD as Consulting Physician (Pulmonary Disease) Guy Sandiferra, Truc, OD-optometry  ED visit 08/21/16 for N/V/D and ED to hospital admission on 04/05/16 for small bowel obstruction.      Assessment:   This is a routine wellness examination for [name].   Hearing/Vision screen No deficit noted during visit. Due for eye exam  Dietary issues and exercise activities discussed: Current Exercise Habits: The patient does not participate in regular exercise at present (Patient used to participate in an exercise progam and walked 5 miles a day. She is too overwhelmed now taking care of her son. She is very active around her house and with her son.), Exercise limited by: None identified  Goals    . Exercise 150 minutes per week (moderate activity)          Walk for 30 minutes daily      Depression Screen PHQ 2/9 Scores 02/14/2017 01/17/2017 12/24/2016 11/22/2016 11/14/2016 10/25/2016 09/12/2016  PHQ - 2 Score 3 0 0 0 0 0 1  PHQ- 9 Score 13 - - - - - -    Fall Risk Fall Risk  02/14/2017 01/17/2017 12/24/2016 11/22/2016 10/25/2016  Falls in the past year? No No No No No  Number falls in past yr: - - - - -  Injury with Fall? - - - - -  Follow up - - - - -    Cognitive Function: MMSE - Mini Mental State Exam 02/14/2017 06/04/2016 02/21/2016 10/26/2015 09/13/2015  Orientation to time 3 3 5 2 4   Orientation to Place 3 2 5 5 5   Registration 3 3 3 3 3   Attention/ Calculation 0 2 4 3 1   Recall 2 2 0 2 1  Language- name 2 objects  2  2 2 2 2   Language- repeat 0 1 1 1 1   Language- follow 3 step command 3 2 3 3 3   Language- read & follow direction 1 0 1 1 1   Write a sentence 1 1 1 1 1   Copy design 0 1 1 0 0  Total score 18 19 26 23 22     Did not attempt attention/calculation. Even so, abnormal result is likely.     Screening Tests Health Maintenance  Topic Date Due  . TETANUS/TDAP  11/28/2016  . DEXA SCAN  02/15/2019  . INFLUENZA VACCINE  Completed  . PNA vac Low Risk Adult  Completed      Plan:  -Walk for 30 minutes a day -Remember to take time for yourself to avoid caregiver strain.  Since her son is also a patient here I will see if there are any services that we can arrange to help them out.  -Keep f/u with Dr on 02/21/17 -Discuss Depression and possible medication change at next visit -Review Advance Directives and bring a signed notarized copy to our office   I have personally reviewed and noted the following in the patient's chart:   . Medical and social history . Use of alcohol, tobacco or illicit drugs  . Current medications and supplements . Functional ability and status . Nutritional status . Physical activity . Advanced directives . List of other physicians . Hospitalizations, surgeries, and ER visits in previous 12 months . Vitals . Screenings to include cognitive, depression, and falls . Referrals and appointments  In addition, I have reviewed and discussed with patient certain preventive protocols, quality metrics, and best practice recommendations. A written personalized care plan for preventive services as well as general preventive health recommendations were provided to patient.     , RN   02/18/2017     I have reviewed and agree with the above AWV documentation.   01/28/2017, MD [facility] 02/18/2017, 1:41  PM

## 2017-02-18 ENCOUNTER — Telehealth: Payer: Self-pay | Admitting: Pediatrics

## 2017-02-18 NOTE — Telephone Encounter (Signed)
Spoke with patient's daughter. She had some information to relay about her mother's finances. She did not ask for any information and none was provided because I didn't see her listed on a Release of Information form.

## 2017-02-21 ENCOUNTER — Encounter: Payer: Self-pay | Admitting: Pediatrics

## 2017-02-21 ENCOUNTER — Ambulatory Visit (INDEPENDENT_AMBULATORY_CARE_PROVIDER_SITE_OTHER): Payer: Medicare Other | Admitting: Pediatrics

## 2017-02-21 ENCOUNTER — Telehealth: Payer: Self-pay | Admitting: Pediatrics

## 2017-02-21 ENCOUNTER — Telehealth: Payer: Self-pay | Admitting: *Deleted

## 2017-02-21 VITALS — BP 136/85 | HR 82 | Temp 98.3°F | Ht 62.0 in | Wt 154.4 lb

## 2017-02-21 DIAGNOSIS — G8929 Other chronic pain: Secondary | ICD-10-CM

## 2017-02-21 DIAGNOSIS — F411 Generalized anxiety disorder: Secondary | ICD-10-CM

## 2017-02-21 DIAGNOSIS — R609 Edema, unspecified: Secondary | ICD-10-CM

## 2017-02-21 DIAGNOSIS — F339 Major depressive disorder, recurrent, unspecified: Secondary | ICD-10-CM | POA: Diagnosis not present

## 2017-02-21 DIAGNOSIS — M25511 Pain in right shoulder: Secondary | ICD-10-CM

## 2017-02-21 DIAGNOSIS — I10 Essential (primary) hypertension: Secondary | ICD-10-CM | POA: Diagnosis not present

## 2017-02-21 MED ORDER — LOSARTAN POTASSIUM 25 MG PO TABS: 25.0000 mg | ORAL_TABLET | Freq: Every day | ORAL | 5 refills | Status: DC

## 2017-02-21 MED ORDER — ESCITALOPRAM OXALATE 10 MG PO TABS: 10.0000 mg | ORAL_TABLET | Freq: Every day | ORAL | 3 refills | Status: DC

## 2017-02-21 NOTE — Telephone Encounter (Signed)
Referral faxed to [facility] [phone]

## 2017-02-21 NOTE — Progress Notes (Addendum)
  Subjective:   Patient ID: [name], female    DOB: [birth year], 82 y.o.   MRN: [id number] CC: Follow-up (3 month); Shoulder Pain (right); and Nose Sores  HPI: [name] is a 82 y.o. female presenting for Follow-up (3 month); Shoulder Pain (right); and Nose Sores  Continues to have shoulder pain Has been using it more past few months to help her son transfer in and out of bed Wants to see orthopedics  Mood has been worse Having some thoughts about not wanting to be here Wishing a car would hit her  Says she would never do anything to hurt herself because there is no one else to help care for her son now  Her sister lives in [place] and has been v supportive She belongs to a church [employer]) in the area,enjoys being part of that community, has not let them know about what is going on with her son's medical problems but she would like to meet with pastor  Relevant past medical, surgical, family and social history reviewed. Allergies and medications reviewed and updated. History  Smoking Status  . Current Some Day Smoker  . Packs/day: 0.10  . Years: 60.00  . Types: Cigarettes  Smokeless Tobacco  . Never Used    Comment: Smokes a cigarette maybe once a week at the most when she feel anxious   ROS: Per HPI   Objective:    BP 136/85   Pulse 82   Temp 98.3 F (36.8 C) (Oral)   Ht 5\' 2"  (1.575 m)   Wt 154 lb 6.4 oz (70 kg)   BMI 28.24 kg/m   Wt Readings from Last 3 Encounters:  02/21/17 154 lb 6.4 oz (70 kg)  02/14/17 154 lb (69.9 kg)  01/17/17 153 lb (69.4 kg)    Gen: NAD, alert, cooperative with exam, NCAT EYES: EOMI, no conjunctival injection, or no icterus ENT:  OP without erythema LYMPH: no cervical LAD CV: NRRR, normal S1/S2, no murmur, distal pulses 2+ b/l Resp: CTABL, no wheezes, normal WOB Abd: +BS, soft, NTND.  Ext: No edema, warm Neuro: Alert and oriented Psych: tearful at times  Assessment & Plan:  [name] was seen today for  follow-up, shoulder pain and nose sores.  Diagnoses and all orders for this visit:  Chronic right shoulder pain Ongoing for several months, wants to f/u with  -     Ambulatory referral to Orthopedic Surgery  GAD (generalized anxiety disorder) Depression, recurrent (HCC) Worsening symptoms +SI Refer to psych Will let [facility] worsening symptoms and medicine changes Contacted sister at pt's request and with her permission discussed what we discussed in clinic today, sister will check on her this week and help her schedule meeting with her pastor per pt request Will contact [facility] with above changes -     escitalopram (LEXAPRO) 10 MG tablet; Take 1 tablet (10 mg total) by mouth daily. -     Ambulatory referral to Psychiatry  Essential hypertension Stable, cont below -     losartan (COZAAR) 25 MG tablet; Take 1 tablet (25 mg total) by mouth daily.  I spent 25 minutes with the patient with over 50% of the encounter time dedicated to counseling on the above problems.   Follow up plan: Return in about 4 weeks (around 03/21/2017). 03/23/2017, MD Rex Kras [facility]

## 2017-02-22 ENCOUNTER — Telehealth (HOSPITAL_COMMUNITY): Payer: Self-pay

## 2017-02-22 NOTE — Telephone Encounter (Signed)
Called daughter back

## 2017-02-28 ENCOUNTER — Ambulatory Visit (INDEPENDENT_AMBULATORY_CARE_PROVIDER_SITE_OTHER): Payer: Medicare Other | Admitting: Orthopaedic Surgery

## 2017-03-07 ENCOUNTER — Ambulatory Visit (INDEPENDENT_AMBULATORY_CARE_PROVIDER_SITE_OTHER): Payer: Medicare Other | Admitting: Orthopaedic Surgery

## 2017-03-22 ENCOUNTER — Other Ambulatory Visit: Payer: Self-pay | Admitting: Pediatrics

## 2017-03-25 ENCOUNTER — Telehealth (HOSPITAL_COMMUNITY): Payer: Self-pay

## 2017-03-25 ENCOUNTER — Ambulatory Visit: Payer: Medicare Other | Admitting: Pediatrics

## 2017-03-25 NOTE — Progress Notes (Deleted)
Psychiatric Initial Adult Assessment   Patient Identification: [name] MRN:  [id number] Date of Evaluation:  03/25/2017 Referral Source: *** Chief Complaint:   Visit Diagnosis: No diagnosis found.  History of Present Illness:   [name] is a 82 year old female with depression, anxiety, hypertension, right shoulder pain, who is referred for depression.      Associated Signs/Symptoms: Depression Symptoms:  {DEPRESSION SYMPTOMS:20000} (Hypo) Manic Symptoms:  {BHH MANIC SYMPTOMS:22872} Anxiety Symptoms:  {BHH ANXIETY SYMPTOMS:22873} Psychotic Symptoms:  {BHH PSYCHOTIC SYMPTOMS:22874} PTSD Symptoms: {BHH PTSD SYMPTOMS:22875}  Past Psychiatric History:  Outpatient:  Psychiatry admission:  Previous suicide attempt:  Past trials of medication:  History of violence:   Previous Psychotropic Medications: {YES/NO:21197}  Substance Abuse History in the last 12 months:  {yes no:314532}  Consequences of Substance Abuse: {BHH CONSEQUENCES OF SUBSTANCE ABUSE:22880}  Past Medical History:  Past Medical History:  Diagnosis Date  . Allergic rhinitis   . Anxiety   . Arthritis   . Cataract    had eye surgery  . Depression   . Essential hypertension, benign   . GERD (gastroesophageal reflux disease)   . History of blood transfusion   . Left bundle branch block   . Memory loss   . Mixed hyperlipidemia   . Occipital neuralgia of left side   . Osteopenia   . 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 Urinary urgency     Past Surgical History:  Procedure Laterality Date  . APPENDECTOMY    . BACK SURGERY    . CARDIAC CATHETERIZATION  2010   Normal coronaries  . CATARACT EXTRACTION W/PHACO  04/16/2011   Procedure: CATARACT EXTRACTION PHACO AND INTRAOCULAR LENS PLACEMENT (IOC);  Surgeon: Susa Simmonds;  Location: [facility];  Service: Ophthalmology;  Laterality: Left;  CDE=11.35  . CATARACT EXTRACTION W/PHACO  01/28/2012   Procedure:  CATARACT EXTRACTION PHACO AND INTRAOCULAR LENS PLACEMENT (IOC);  Surgeon: Susa Simmonds, MD;  Location: [facility];  Service: Ophthalmology;  Laterality: Right;  CDI:8.15  . CYSTO WITH HYDRODISTENSION  05/29/2012   Procedure: CYSTOSCOPY/HYDRODISTENSION;  Surgeon: Martina Sinner, MD;  Location[facility];  Service: Urology;  Laterality: N/A;  INSTILLATION OF MARCAINE AND PYRIDIUM   . DILATION AND CURETTAGE OF UTERUS  ?<hysterectomy"  . HAMMER TOE SURGERY Bilateral 11/22/11   [facility], [name]  . JOINT REPLACEMENT    . LAPAROSCOPIC CHOLECYSTECTOMY  1991  . LUMBAR LAMINECTOMY  2011  . LUMBAR LAMINECTOMY/DECOMPRESSION MICRODISCECTOMY Left 06/06/2012   Procedure: LUMBAR LAMINECTOMY/DECOMPRESSION MICRODISCECTOMY 1 LEVEL;  Surgeon: Maeola Harman, MD;  Location: [facility];  Service: Neurosurgery;  Laterality: Left;  Left Lumbar two-three Laminectomy for resection of synovial cyst  . SHOULDER OPEN ROTATOR CUFF REPAIR Right 2011  . TONSILLECTOMY    . TOTAL ABDOMINAL HYSTERECTOMY    . TOTAL HIP ARTHROPLASTY Right 03/13/2013   Procedure: TOTAL HIP ARTHROPLASTY;  Surgeon: Nestor Lewandowsky, MD;  Location: [facility];  Service: Orthopedics;  Laterality: Right;    Family Psychiatric History: ***  Family History:  Family History  Problem Relation Age of Onset  . Diabetes Father   . Diabetes Brother   . Cancer Brother 36       colon  . Diabetes Brother   . Diabetes Brother   . Hypertension Sister   . Diabetes Sister   . Diabetes Sister   . Diabetes Other   . Allergies Other   . Diabetes Son   . Colon  cancer Neg Hx   . Anesthesia problems Neg Hx   . Hypotension Neg Hx   . Malignant hyperthermia Neg Hx   . Pseudochol deficiency Neg Hx     Social History:   Social History   Socioeconomic History  . Marital status: Widowed    Spouse name: Not on file  . Number of children: 5  . Years of education: 8th grade  . Highest education level: Not on file  Social Needs  . Financial resource  strain: Not on file  . Food insecurity - worry: Not on file  . Food insecurity - inability: Not on file  . Transportation needs - medical: Not on file  . Transportation needs - non-medical: Not on file  Occupational History  . Occupation: Worked in Geneticist, molecular for 30 years    Employer: RETIRED    Comment: Retired  . Occupation: Part time care giver  Tobacco Use  . Smoking status: Current Some Day Smoker    Packs/day: 0.10    Years: 60.00    Pack years: 6.00    Types: Cigarettes  . Smokeless tobacco: Never Used  . Tobacco comment: Smokes a cigarette maybe once a week at the most when she feel anxious  Substance and Sexual Activity  . Alcohol use: No    Alcohol/week: 0.0 oz  . Drug use: No  . Sexual activity: No  Other Topics Concern  . Not on file  Social History Narrative   Divorced, lives alone.   Right-handed.   1 cup coffee per day.    Additional Social History: ***  Allergies:   Allergies  Allergen Reactions  . Lisinopril-Hydrochlorothiazide Other (See Comments)    Lip swelling and cough. Stopped by ENT. Patient is on Losartan without any issues   . Codeine Itching and Swelling    eye irritation (prescribed percocet)  . Penicillins Itching and Swelling    Has patient had a PCN reaction causing immediate rash, facial/tongue/throat swelling, SOB or lightheadedness with hypotension: Yes Has patient had a PCN reaction causing severe rash involving mucus membranes or skin necrosis: No Has patient had a PCN reaction that required hospitalization No Has patient had a PCN reaction occurring within the last 10 years: No If all of the above answers are "NO", then may proceed with Cephalosporin use. eye irritation  . Aricept [Donepezil Hcl]     Nausea and vomiting  . Namenda [Memantine Hcl]     Nausea and vomiting    Metabolic Disorder Labs: Lab Results  Component Value Date   HGBA1C 5.8 (H) 10/04/2013   MPG 120 (H) 10/04/2013   No results found for:  PROLACTIN Lab Results  Component Value Date   CHOL 197 12/10/2014   TRIG 93 12/10/2014   HDL 78 12/10/2014   CHOLHDL 2.5 12/10/2014   LDLCALC 100 (H) 12/10/2014   LDLCALC 102 (H) 07/07/2014     Current Medications: Current Outpatient Medications  Medication Sig Dispense Refill  . albuterol (PROVENTIL HFA;VENTOLIN HFA) 108 (90 Base) MCG/ACT inhaler Inhale 2 puffs into the lungs every 6 (six) hours as needed for wheezing or shortness of breath. 1 Inhaler 2  . aspirin EC 81 MG tablet Take 81 mg by mouth daily.    . budesonide-formoterol (SYMBICORT) 160-4.5 MCG/ACT inhaler Inhale 2 puffs into the lungs 2 (two) times daily. 10.2 g 4  . buPROPion (WELLBUTRIN SR) 150 MG 12 hr tablet TAKE (1) TABLET TWICE A DAY. 60 tablet 2  . carvedilol (COREG) 6.25 MG  tablet TAKE  (1)  TABLET TWICE A DAY WITH MEALS (BREAKFAST AND SUPPER) 60 tablet 0  . DYMISTA 137-50 MCG/ACT SUSP One spray per nostril twice a day 23 g 5  . escitalopram (LEXAPRO) 10 MG tablet Take 1 tablet (10 mg total) by mouth daily. 30 tablet 3  . esomeprazole (NEXIUM) 40 MG capsule Take 1 capsule (40 mg total) by mouth daily before breakfast. 90 capsule 1  . FLUZONE HIGH-DOSE 0.5 ML injection     . furosemide (LASIX) 20 MG tablet Take 1/2 tablet (10 mg total) by mouth daily. 30 tablet 5  . ibuprofen (ADVIL,MOTRIN) 600 MG tablet Take 1 tablet (600 mg total) by mouth every 8 (eight) hours as needed. 30 tablet 0  . loratadine (CLARITIN) 10 MG tablet Take 1 tablet (10 mg total) by mouth daily. 30 tablet 11  . losartan (COZAAR) 25 MG tablet Take 1 tablet (25 mg total) by mouth daily. 30 tablet 5  . meclizine (ANTIVERT) 25 MG tablet Take 1 tablet (25 mg total) by mouth 3 (three) times daily as needed for dizziness. 30 tablet 0  . Multiple Vitamins-Minerals (CENTRUM SILVER PO) Take 1 tablet by mouth daily.    . ondansetron (ZOFRAN) 4 MG tablet Take 1 tablet every 8 hours as needed for nausea or vomiting. 20 tablet 0  . polyethylene glycol powder  (GLYCOLAX/MIRALAX) powder Take 17 g by mouth 2 (two) times daily as needed. 3350 g 1  . raloxifene (EVISTA) 60 MG tablet TAKE 1 TABLET DAILY 90 tablet 0  . triamcinolone (KENALOG) 0.025 % ointment Apply 1 application topically 2 (two) times daily. 30 g 0   No current facility-administered medications for this visit.     Neurologic: Headache: No Seizure: No Paresthesias:No  Musculoskeletal: Strength & Muscle Tone: within normal limits Gait & Station: normal Patient leans: N/A  Psychiatric Specialty Exam: ROS  There were no vitals taken for this visit.There is no height or weight on file to calculate BMI.  General Appearance: Fairly Groomed  Eye Contact:  Good  Speech:  Clear and Coherent  Volume:  Normal  Mood:  {BHH MOOD:22306}  Affect:  {Affect (PAA):22687}  Thought Process:  Coherent and Goal Directed  Orientation:  Full (Time, Place, and Person)  Thought Content:  Logical  Suicidal Thoughts:  {ST/HT (PAA):22692}  Homicidal Thoughts:  {ST/HT (PAA):22692}  Memory:  Immediate;   Good Recent;   Good Remote;   Good  Judgement:  {Judgement (PAA):22694}  Insight:  {Insight (PAA):22695}  Psychomotor Activity:  Normal  Concentration:  Concentration: Good and Attention Span: Good  Recall:  Good  Fund of Knowledge:Good  Language: Good  Akathisia:  No  Handed:  Right  AIMS (if indicated):  N/A  Assets:  Communication Skills Desire for Improvement  ADL's:  Intact  Cognition: WNL  Sleep:  ***   Assessment  Plan  The patient demonstrates the following risk factors for suicide: Chronic risk factors for suicide include: {Chronic Risk Factors for QQIWLNL:89211941}. Acute risk factors for suicide include: {Acute Risk Factors for DEYCXKG:81856314}. Protective factors for this patient include: {Protective Factors for Suicide HFWY:63785885}. Considering these factors, the overall suicide risk at this point appears to be {Desc; low/moderate/high:110033}. Patient {ACTION; IS/IS  OYD:74128786} appropriate for outpatient follow up.   Treatment Plan Summary: {CHL AMB [place] [facility] MD [place] VEHM:[id number]}   Neysa Hotter, MD 11/26/20189:34 AM

## 2017-03-27 ENCOUNTER — Ambulatory Visit: Payer: Medicare Other | Admitting: Pediatrics

## 2017-03-28 ENCOUNTER — Ambulatory Visit (INDEPENDENT_AMBULATORY_CARE_PROVIDER_SITE_OTHER): Payer: Medicare Other | Admitting: Orthopaedic Surgery

## 2017-03-28 ENCOUNTER — Ambulatory Visit (HOSPITAL_COMMUNITY): Payer: Self-pay | Admitting: Psychiatry

## 2017-04-01 ENCOUNTER — Encounter: Payer: Self-pay | Admitting: Pediatrics

## 2017-04-03 NOTE — Telephone Encounter (Signed)
error 

## 2017-04-04 ENCOUNTER — Ambulatory Visit (INDEPENDENT_AMBULATORY_CARE_PROVIDER_SITE_OTHER): Payer: Medicare Other | Admitting: Pediatrics

## 2017-04-04 ENCOUNTER — Encounter: Payer: Self-pay | Admitting: Pediatrics

## 2017-04-04 ENCOUNTER — Other Ambulatory Visit: Payer: Self-pay | Admitting: Pediatrics

## 2017-04-04 VITALS — BP 137/81 | HR 69 | Temp 97.7°F | Ht 62.0 in | Wt 154.6 lb

## 2017-04-04 DIAGNOSIS — Z720 Tobacco use: Secondary | ICD-10-CM | POA: Diagnosis not present

## 2017-04-04 DIAGNOSIS — I5022 Chronic systolic (congestive) heart failure: Secondary | ICD-10-CM

## 2017-04-04 DIAGNOSIS — F0390 Unspecified dementia without behavioral disturbance: Secondary | ICD-10-CM

## 2017-04-04 DIAGNOSIS — F03A0 Unspecified dementia, mild, without behavioral disturbance, psychotic disturbance, mood disturbance, and anxiety: Secondary | ICD-10-CM

## 2017-04-04 DIAGNOSIS — J449 Chronic obstructive pulmonary disease, unspecified: Secondary | ICD-10-CM | POA: Diagnosis not present

## 2017-04-04 DIAGNOSIS — F339 Major depressive disorder, recurrent, unspecified: Secondary | ICD-10-CM | POA: Diagnosis not present

## 2017-04-04 MED ORDER — ONDANSETRON HCL 4 MG PO TABS: ORAL_TABLET | ORAL | 0 refills | Status: DC

## 2017-04-04 MED ORDER — ALBUTEROL SULFATE HFA 108 (90 BASE) MCG/ACT IN AERS: 2.0000 | INHALATION_SPRAY | Freq: Four times a day (QID) | RESPIRATORY_TRACT | 2 refills | Status: DC | PRN

## 2017-04-04 NOTE — Progress Notes (Signed)
  Subjective:   Patient ID: [name], female    DOB: [birth year], 82 y.o.   MRN: [id number] CC: Follow-up (4 week) Depression HPI: [name] is a 82 y.o. female presenting for Follow-up (4 week)  Had episode of vomiting this morning Says she feels much better now Had cream of chicken soup last night, then ate it again this morning Had one episode of diarrhea this morning too Feels much better now, back to normal self now  Cough bothers her when she lays down at night Using dymista nasal spray at night Not coughing during the day Gets SOB when she walks back and forth from the mailbox, using symbicort twice a day Not wheezing at night  Going on a cruise with her daughters and about 8 people in her family Leaving tomorrow  Mood has been much improved Taking lexapro regularly No thoughts of self harm Still getting calls from [facility]  Chronic systolic CHF: No swelling in her lower legs  does get short of breath at times with walking long distances  Continues to be bothered by her memory Seen by neurology in the past, diagnosed with likely Alzheimer's Her mother had a history of Alzheimer's Still taking care of her son daily  Relevant past medical, surgical, family and social history reviewed. Allergies and medications reviewed and updated. Social History   Tobacco Use  Smoking Status Current Some Day Smoker  . Packs/day: 0.10  . Years: 60.00  . Pack years: 6.00  . Types: Cigarettes  Smokeless Tobacco Never Used  Tobacco Comment   Smokes a cigarette maybe once a week at the most when she feel anxious   ROS: Per HPI   Objective:    BP 137/81   Pulse 69   Temp 97.7 F (36.5 C) (Oral)   Ht 5\' 2"  (1.575 m)   Wt 154 lb 9.6 oz (70.1 kg)   BMI 28.28 kg/m   Wt Readings from Last 3 Encounters:  04/04/17 154 lb 9.6 oz (70.1 kg)  02/21/17 154 lb 6.4 oz (70 kg)  02/14/17 154 lb (69.9 kg)    Gen: NAD, alert, cooperative with exam, NCAT EYES: EOMI, no  conjunctival injection, or no icterus ENT:  OP without erythema LYMPH: no cervical LAD CV: NRRR, normal S1/S2 Resp: CTABL, no wheezes, normal WOB Abd: +BS, soft, NTND Ext: No edema, warm Neuro: Alert and oriented, MSK: normal muscle bulk Psych: Normal affect, no thoughts of self-harm or not wanting to be here  Assessment & Plan:  [name] was seen today for follow-up multiple medical problems  Diagnoses and all orders for this visit:  Chronic systolic CHF (congestive heart failure) (HCC) EF 45-50% on last echo 2017 On arb, aspirin, beta-blocker -     Ambulatory referral to Cardiology  Chronic obstructive pulmonary disease, unspecified COPD type (HCC) -     albuterol (PROVENTIL HFA;VENTOLIN HFA) 108 (90 Base) MCG/ACT inhaler; Inhale 2 puffs into the lungs every 6 (six) hours as needed for wheezing or shortness of breath.  Depression, recurrent (HCC) Symptoms improved, on Wellbutrin, Lexapro now Continue same dosing  Mild dementia No treatable causes for dementia bedbound, does not tolerate Namenda, Aricept  Tobacco use Continue to encourage cessation  Follow up plan: Return in about 3 months (around 07/03/2017). 09/02/2017, MD Rex Kras [facility]

## 2017-04-04 NOTE — Patient Instructions (Addendum)
Schedule annual wellness visit on your way out

## 2017-04-06 ENCOUNTER — Encounter: Payer: Self-pay | Admitting: Pediatrics

## 2017-04-15 ENCOUNTER — Telehealth: Payer: Self-pay | Admitting: Pediatrics

## 2017-04-15 NOTE — Telephone Encounter (Signed)
What symptoms do you have? Drainage and coughing  How long have you been sick? 2 weeks  Have you been seen for this problem? NO  If your provider decides to give you a prescription, which pharmacy would you like for it to be sent to? [facility]   Patient informed that this information will be sent to the clinical staff for review and that they should receive a follow up call.

## 2017-04-15 NOTE — Telephone Encounter (Signed)
No shortness of breath.  Daughter aware to call [facility] to schedule.

## 2017-04-15 NOTE — Telephone Encounter (Signed)
Is she having SOB, trouble breathing? I can see her at 415 today or tomorrow in one of my acute appts.

## 2017-04-15 NOTE — Telephone Encounter (Signed)
No appointments available with Dr. Oswaldo Done or PM clinic or other providers.  Please advise on medications for patient.

## 2017-04-16 ENCOUNTER — Ambulatory Visit (INDEPENDENT_AMBULATORY_CARE_PROVIDER_SITE_OTHER): Payer: Medicare Other | Admitting: Pediatrics

## 2017-04-16 ENCOUNTER — Encounter: Payer: Self-pay | Admitting: Pediatrics

## 2017-04-16 VITALS — BP 133/74 | HR 77 | Temp 97.9°F | Ht 62.0 in | Wt 157.0 lb

## 2017-04-16 DIAGNOSIS — H65113 Acute and subacute allergic otitis media (mucoid) (sanguinous) (serous), bilateral: Secondary | ICD-10-CM

## 2017-04-16 DIAGNOSIS — J449 Chronic obstructive pulmonary disease, unspecified: Secondary | ICD-10-CM | POA: Diagnosis not present

## 2017-04-16 MED ORDER — ALBUTEROL SULFATE HFA 108 (90 BASE) MCG/ACT IN AERS: 2.0000 | INHALATION_SPRAY | Freq: Four times a day (QID) | RESPIRATORY_TRACT | 2 refills | Status: DC | PRN

## 2017-04-16 MED ORDER — AZITHROMYCIN 250 MG PO TABS: ORAL_TABLET | ORAL | 0 refills | Status: DC

## 2017-04-16 NOTE — Patient Instructions (Signed)
Use saline salt spray as needed to help thin mucus in sinuses Continue to use flonase two sprays each side every day

## 2017-04-16 NOTE — Progress Notes (Signed)
  Subjective:   Patient ID: [name], female    DOB: 1935-01-20, 82 y.o.   MRN: [id number] CC: Facial Pain and Nasal Congestion  HPI: [name] is a 82 y.o. female presenting for Facial Pain and Nasal Congestion  Went to [place] on a cruise Says she missed the snow, everyone got along while they were gone Friend stayed with her friend Started having some sinus drainage, staying about the same Has been using fluticasone with some relief  No fevers, appetite is down Ears feel stuffed No wheezing No shortness of breath  Relevant past medical, surgical, family and social history reviewed. Allergies and medications reviewed and updated. Social History   Tobacco Use  Smoking Status Current Some Day Smoker  . Packs/day: 0.10  . Years: 60.00  . Pack years: 6.00  . Types: Cigarettes  Smokeless Tobacco Never Used  Tobacco Comment   Smokes a cigarette maybe once a week at the most when she feel anxious   ROS: Per HPI   Objective:    BP 133/74   Pulse 77   Temp 97.9 F (36.6 C) (Oral)   Ht 5\' 2"  (1.575 m)   Wt 157 lb (71.2 kg)   BMI 28.72 kg/m   Wt Readings from Last 3 Encounters:  04/16/17 157 lb (71.2 kg)  04/04/17 154 lb 9.6 oz (70.1 kg)  02/21/17 154 lb 6.4 oz (70 kg)    Gen: NAD, alert, cooperative with exam, NCAT, congested EYES: EOMI, no conjunctival injection, or no icterus ENT:  Bilateral white effusion, TMs slightly pink, OP with mild erythema LYMPH: no cervical LAD CV: NRRR, normal S1/S2, no murmur, distal pulses 2+ b/l Resp: CTABL, no wheezes, normal WOB Abd: +BS, soft, NTND. no guarding or organomegaly Ext: No edema, warm Neuro: Alert and oriented, strength equal b/l UE and LE, coordination grossly normal MSK: normal muscle bulk  Assessment & Plan:  [name] was seen today for facial pain and nasal congestion.  Diagnoses and all orders for this visit:  Acute mucoid otitis media of both ears -     azithromycin (ZITHROMAX) 250 MG tablet; Take 2  the first day and then one each day after.  Chronic obstructive pulmonary disease, unspecified COPD type (HCC) No wheezing today Continue below as needed, if needing regularly needs to be seen -     albuterol (PROVENTIL HFA;VENTOLIN HFA) 108 (90 Base) MCG/ACT inhaler; Inhale 2 puffs into the lungs every 6 (six) hours as needed for wheezing or shortness of breath.   Follow up plan: Return if symptoms worsen or fail to improve. [name], MD Rex Kras [facility]

## 2017-04-19 ENCOUNTER — Other Ambulatory Visit: Payer: Self-pay | Admitting: Pediatrics

## 2017-04-19 DIAGNOSIS — I10 Essential (primary) hypertension: Secondary | ICD-10-CM

## 2017-04-22 ENCOUNTER — Other Ambulatory Visit: Payer: Self-pay | Admitting: Pediatrics

## 2017-04-27 ENCOUNTER — Ambulatory Visit (INDEPENDENT_AMBULATORY_CARE_PROVIDER_SITE_OTHER): Payer: Medicare Other | Admitting: Physician Assistant

## 2017-04-27 ENCOUNTER — Encounter: Payer: Self-pay | Admitting: Physician Assistant

## 2017-04-27 ENCOUNTER — Telehealth: Payer: Self-pay | Admitting: Pediatrics

## 2017-04-27 VITALS — BP 145/83 | HR 73 | Temp 97.4°F | Ht 62.0 in | Wt 151.0 lb

## 2017-04-27 DIAGNOSIS — J0110 Acute frontal sinusitis, unspecified: Secondary | ICD-10-CM

## 2017-04-27 DIAGNOSIS — M25511 Pain in right shoulder: Secondary | ICD-10-CM | POA: Diagnosis not present

## 2017-04-27 DIAGNOSIS — G8929 Other chronic pain: Secondary | ICD-10-CM

## 2017-04-27 MED ORDER — ONDANSETRON HCL 4 MG PO TABS: ORAL_TABLET | ORAL | 0 refills | Status: DC

## 2017-04-27 MED ORDER — DOXYCYCLINE HYCLATE 100 MG PO TABS: 100.0000 mg | ORAL_TABLET | Freq: Two times a day (BID) | ORAL | 0 refills | Status: DC

## 2017-04-27 MED ORDER — HYDROCODONE-ACETAMINOPHEN 10-325 MG PO TABS: 1.0000 | ORAL_TABLET | Freq: Four times a day (QID) | ORAL | 0 refills | Status: DC | PRN

## 2017-04-27 MED ORDER — CYCLOBENZAPRINE HCL 10 MG PO TABS: 10.0000 mg | ORAL_TABLET | Freq: Three times a day (TID) | ORAL | 0 refills | Status: DC | PRN

## 2017-04-27 MED ORDER — METHYLPREDNISOLONE ACETATE 80 MG/ML IJ SUSP
80.0000 mg | Freq: Once | INTRAMUSCULAR | Status: AC
  Administered 2017-04-27: 80 mg via INTRAMUSCULAR

## 2017-04-27 NOTE — Telephone Encounter (Signed)
What symptoms do you have? Shoulder pain did not sleep at all last night   How long have you been sick? For weeks  Have you been seen for this problem? No ,has appt on 05/02/17  If your provider decides to give you a prescription, which pharmacy would you like for it to be sent to? [facility]   Patient informed that this information will be sent to the clinical staff for review and that they should receive a follow up call.

## 2017-04-27 NOTE — Patient Instructions (Signed)
In a few days you may receive a survey in the mail or online from [employer] regarding your visit with us today. Please take a moment to fill this out. Your feedback is very important to our whole office. It can help us better understand your needs as well as improve your experience and satisfaction. Thank you for taking your time to complete it. We care about you.  Montell Leopard, PA-C  

## 2017-04-27 NOTE — Telephone Encounter (Signed)
Pt came in for evaluation

## 2017-04-29 ENCOUNTER — Other Ambulatory Visit: Payer: Self-pay | Admitting: Pediatrics

## 2017-04-29 NOTE — Progress Notes (Signed)
BP (!) 145/83   Pulse 73   Temp (!) 97.4 F (36.3 C) (Oral)   Ht _0  (1.575 m)   Wt 151 lb (68.5 kg)   BMI 27.62 kg/m    Subjective:    Patient ID: [name], female    DOB: [birth year], 82 y.o.   MRN: [id number]  HPI: SHEKETA ENDE is a 82 y.o. female presenting on 04/27/2017 for Shoulder Pain (right, no known injury. Knows that she needs a replacement. Wants cortisone injection.)  This patient has had many days of sinus headache and postnasal drainage. There is copious drainage at times. Denies any fever at this time. There has been a history of sinus infections in the past.  No history of sinus surgery. There is cough at night. It has become more prevalent in recent days.  Also with severe right shoulder pain and very decreased ROM. It has been spoken that she should have shoulder replacement. Has ortho appointment in the new year.  Relevant past medical, surgical, family and social history reviewed and updated as indicated. Allergies and medications reviewed and updated.  Past Medical History:  Diagnosis Date  . Allergic rhinitis   . Anxiety   . Arthritis   . Cataract    had eye surgery  . Depression   . Elevated troponin 04/05/2016  . Essential hypertension, benign   . GERD (gastroesophageal reflux disease)   . History of blood transfusion   . Left bundle branch block   . Memory loss   . Mixed hyperlipidemia   . Occipital neuralgia of left side   . Osteopenia   . 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 Urinary urgency     Past Surgical History:  Procedure Laterality Date  . APPENDECTOMY    . BACK SURGERY    . CARDIAC CATHETERIZATION  2010   Normal coronaries  . CATARACT EXTRACTION W/PHACO  04/16/2011   Procedure: CATARACT EXTRACTION PHACO AND INTRAOCULAR LENS PLACEMENT (IOC);  Surgeon: Williams Che;  Location: [facility];  Service: Ophthalmology;  Laterality: Left;  CDE=11.35  . CATARACT EXTRACTION W/PHACO  01/28/2012   Procedure: CATARACT EXTRACTION PHACO AND INTRAOCULAR LENS PLACEMENT (IOC);  Surgeon: Williams Che, MD;  Location: [facility];  Service: Ophthalmology;  Laterality: Right;  CDI:8.15  . CYSTO WITH HYDRODISTENSION  05/29/2012   Procedure: CYSTOSCOPY/HYDRODISTENSION;  Surgeon: Reece Packer, MD;  Location: [facility];  Service: Urology;  Laterality: N/A;  INSTILLATION OF MARCAINE AND PYRIDIUM   . DILATION AND CURETTAGE OF UTERUS  ?<hysterectomy"  . HAMMER TOE SURGERY Bilateral 11/22/11   [place], [place]  . JOINT REPLACEMENT    . LAPAROSCOPIC CHOLECYSTECTOMY  1991  . LUMBAR LAMINECTOMY  2011  . LUMBAR LAMINECTOMY/DECOMPRESSION MICRODISCECTOMY Left 06/06/2012   Procedure: LUMBAR LAMINECTOMY/DECOMPRESSION MICRODISCECTOMY 1 LEVEL;  Surgeon: Erline Levine, MD;  Location: [place] [facility];  Service: Neurosurgery;  Laterality: Left;  Left Lumbar two-three Laminectomy for resection of synovial cyst  . SHOULDER OPEN ROTATOR CUFF REPAIR Right 2011  . TONSILLECTOMY    . TOTAL ABDOMINAL HYSTERECTOMY    . TOTAL HIP ARTHROPLASTY Right 03/13/2013   Procedure: TOTAL HIP ARTHROPLASTY;  Surgeon: Kerin Salen, MD;  Location: [place];  Service: Orthopedics;  Laterality: Right;    Review of Systems  Constitutional: Positive for chills and fatigue. Negative for activity change and appetite change.  HENT: Positive for congestion, postnasal drip and sore throat.   Eyes: Negative.   Respiratory:  Positive for cough and wheezing.   Cardiovascular: Negative.  Negative for chest pain, palpitations and leg swelling.  Gastrointestinal: Negative.   Genitourinary: Negative.   Musculoskeletal: Positive for arthralgias, joint swelling and myalgias.  Skin: Negative.   Neurological: Positive for headaches.    Allergies as of 04/27/2017      Reactions   Lisinopril-hydrochlorothiazide Other (See Comments)   Lip swelling and cough. Stopped by ENT. Patient is on Losartan without any issues    Codeine Itching, Swelling     eye irritation (prescribed percocet)   Penicillins Itching, Swelling   Has patient had a PCN reaction causing immediate rash, facial/tongue/throat swelling, SOB or lightheadedness with hypotension: Yes Has patient had a PCN reaction causing severe rash involving mucus membranes or skin necrosis: No Has patient had a PCN reaction that required hospitalization No Has patient had a PCN reaction occurring within the last 10 years: No If all of the above answers are "NO", then may proceed with Cephalosporin use. eye irritation   Aricept [donepezil Hcl]    Nausea and vomiting   Namenda [memantine Hcl]    Nausea and vomiting      Medication List        Accurate as of 04/27/17 11:59 PM. Always use your most recent med list.          albuterol 108 (90 Base) MCG/ACT inhaler Commonly known as:  PROVENTIL HFA;VENTOLIN HFA Inhale 2 puffs into the lungs every 6 (six) hours as needed for wheezing or shortness of breath.   aspirin EC 81 MG tablet Take 81 mg by mouth daily.   azithromycin 250 MG tablet Commonly known as:  ZITHROMAX Take 2 the first day and then one each day after.   budesonide-formoterol 160-4.5 MCG/ACT inhaler Commonly known as:  SYMBICORT Inhale 2 puffs into the lungs 2 (two) times daily.   buPROPion 150 MG 12 hr tablet Commonly known as:  WELLBUTRIN SR TAKE (1) TABLET TWICE A DAY.   carvedilol 6.25 MG tablet Commonly known as:  COREG TAKE  (1)  TABLET TWICE A DAY WITH MEALS (BREAKFAST AND SUPPER)   CENTRUM SILVER PO Take 1 tablet by mouth daily.   cyclobenzaprine 10 MG tablet Commonly known as:  FLEXERIL Take 1 tablet (10 mg total) by mouth 3 (three) times daily as needed for muscle spasms.   doxycycline 100 MG tablet Commonly known as:  VIBRA-TABS Take 1 tablet (100 mg total) by mouth 2 (two) times daily.   DYMISTA 137-50 MCG/ACT Susp Generic drug:  Azelastine-Fluticasone One spray per nostril twice a day   escitalopram 10 MG tablet Commonly known  as:  LEXAPRO Take 1 tablet (10 mg total) by mouth daily.   esomeprazole 40 MG capsule Commonly known as:  NEXIUM Take 1 capsule (40 mg total) by mouth daily before breakfast.   FLUZONE HIGH-DOSE 0.5 ML injection Generic drug:  Influenza vac split quadrivalent PF   furosemide 20 MG tablet Commonly known as:  LASIX Take 1/2 tablet (10 mg total) by mouth daily.   HYDROcodone-acetaminophen 10-325 MG tablet Commonly known as:  NORCO Take 1 tablet by mouth every 6 (six) hours as needed.   ibuprofen 600 MG tablet Commonly known as:  ADVIL,MOTRIN Take 1 tablet (600 mg total) by mouth every 8 (eight) hours as needed.   loratadine 10 MG tablet Commonly known as:  CLARITIN Take 1 tablet (10 mg total) by mouth daily.   losartan 25 MG tablet Commonly known as:  COZAAR Take 1 tablet (25  mg total) by mouth daily.   losartan 50 MG tablet Commonly known as:  COZAAR Take 1/2 tablet (25 mg total) by mouth daily.   meclizine 25 MG tablet Commonly known as:  ANTIVERT Take 1 tablet (25 mg total) by mouth 3 (three) times daily as needed for dizziness.   ondansetron 4 MG tablet Commonly known as:  ZOFRAN Take 1 tablet every 8 hours as needed for nausea or vomiting.   polyethylene glycol powder powder Commonly known as:  GLYCOLAX/MIRALAX Take 17 g by mouth 2 (two) times daily as needed.   raloxifene 60 MG tablet Commonly known as:  EVISTA TAKE 1 TABLET DAILY   triamcinolone 0.025 % ointment Commonly known as:  KENALOG Apply 1 application topically 2 (two) times daily.          Objective:    BP (!) 145/83   Pulse 73   Temp (!) 97.4 F (36.3 C) (Oral)   Ht _0  (1.575 m)   Wt 151 lb (68.5 kg)   BMI 27.62 kg/m   Allergies  Allergen Reactions  . Lisinopril-Hydrochlorothiazide Other (See Comments)    Lip swelling and cough. Stopped by ENT. Patient is on Losartan without any issues   . Codeine Itching and Swelling    eye irritation (prescribed percocet)  . Penicillins  Itching and Swelling    Has patient had a PCN reaction causing immediate rash, facial/tongue/throat swelling, SOB or lightheadedness with hypotension: Yes Has patient had a PCN reaction causing severe rash involving mucus membranes or skin necrosis: No Has patient had a PCN reaction that required hospitalization No Has patient had a PCN reaction occurring within the last 10 years: No If all of the above answers are "NO", then may proceed with Cephalosporin use. eye irritation  . Aricept [Donepezil Hcl]     Nausea and vomiting  . Namenda [Memantine Hcl]     Nausea and vomiting    Physical Exam  Constitutional: She is oriented to person, place, and time. She appears well-developed and well-nourished.  HENT:  Head: Normocephalic and atraumatic.  Right Ear: Tympanic membrane and external ear normal. No middle ear effusion.  Left Ear: Tympanic membrane and external ear normal.  No middle ear effusion.  Nose: Mucosal edema and rhinorrhea present. Right sinus exhibits no maxillary sinus tenderness. Left sinus exhibits no maxillary sinus tenderness.  Mouth/Throat: Uvula is midline. Posterior oropharyngeal erythema present.  Eyes: Conjunctivae and EOM are normal. Pupils are equal, round, and reactive to light. Right eye exhibits no discharge. Left eye exhibits no discharge.  Neck: Normal range of motion.  Cardiovascular: Normal rate, regular rhythm and normal heart sounds.  Pulmonary/Chest: Effort normal and breath sounds normal. No respiratory distress. She has no wheezes.  Abdominal: Soft.  Musculoskeletal:       Right shoulder: She exhibits decreased range of motion, swelling and deformity.  Lymphadenopathy:    She has no cervical adenopathy.  Neurological: She is alert and oriented to person, place, and time.  Skin: Skin is warm and dry.  Psychiatric: She has a normal mood and affect.    Results for orders placed or performed in visit on 01/17/17  Anemia Profile B  Result Value Ref  Range   Total Iron Binding Capacity 354 250 - 450 ug/dL   UIBC 196 118 - 369 ug/dL   Iron 158 (H) 27 - 139 ug/dL   Iron Saturation 45 15 - 55 %   Ferritin 69 15 - 150 ng/mL   Vitamin B-12 >  2000 (H) 232 - 1245 pg/mL   Folate 19.7 >3.0 ng/mL   WBC 7.9 3.4 - 10.8 x10E3/uL   RBC 4.43 3.77 - 5.28 x10E6/uL   Hemoglobin 13.5 11.1 - 15.9 g/dL   Hematocrit 41.0 34.0 - 46.6 %   MCV 93 79 - 97 fL   MCH 30.5 26.6 - 33.0 pg   MCHC 32.9 31.5 - 35.7 g/dL   RDW 14.3 12.3 - 15.4 %   Platelets 320 150 - 379 x10E3/uL   Neutrophils 59 Not Estab. %   Lymphs 33 Not Estab. %   Monocytes 6 Not Estab. %   Eos 2 Not Estab. %   Basos 0 Not Estab. %   Neutrophils Absolute 4.6 1.4 - 7.0 x10E3/uL   Lymphocytes Absolute 2.6 0.7 - 3.1 x10E3/uL   Monocytes Absolute 0.5 0.1 - 0.9 x10E3/uL   EOS (ABSOLUTE) 0.2 0.0 - 0.4 x10E3/uL   Basophils Absolute 0.0 0.0 - 0.2 x10E3/uL   Immature Granulocytes 0 Not Estab. %   Immature Grans (Abs) 0.0 0.0 - 0.1 x10E3/uL   Retic Ct Pct 1.3 0.6 - 2.6 %  CMP14+EGFR  Result Value Ref Range   Glucose 69 65 - 99 mg/dL   BUN 14 8 - 27 mg/dL   Creatinine, Ser 1.09 (H) 0.57 - 1.00 mg/dL   GFR calc non Af Amer 48 (L) >59 mL/min/1.73   GFR calc Af Amer 56 (L) >59 mL/min/1.73   BUN/Creatinine Ratio 13 12 - 28   Sodium 145 (H) 134 - 144 mmol/L   Potassium 4.3 3.5 - 5.2 mmol/L   Chloride 109 (H) 96 - 106 mmol/L   CO2 23 20 - 29 mmol/L   Calcium 9.3 8.7 - 10.3 mg/dL   Total Protein 6.4 6.0 - 8.5 g/dL   Albumin 4.0 3.5 - 4.8 g/dL   Globulin, Total 2.4 1.5 - 4.5 g/dL   Albumin/Globulin Ratio 1.7 1.2 - 2.2   Bilirubin Total 0.3 0.0 - 1.2 mg/dL   Alkaline Phosphatase 99 39 - 117 IU/L   AST 22 0 - 40 IU/L   ALT 16 0 - 32 IU/L  Thyroid Panel With TSH  Result Value Ref Range   TSH 0.638 0.450 - 4.500 uIU/mL   T4, Total 8.0 4.5 - 12.0 ug/dL   T3 Uptake Ratio 27 24 - 39 %   Free Thyroxine Index 2.2 1.2 - 4.9      Assessment & Plan:   1. Chronic right shoulder pain -  cyclobenzaprine (FLEXERIL) 10 MG tablet; Take 1 tablet (10 mg total) by mouth 3 (three) times daily as needed for muscle spasms.  Dispense: 30 tablet; Refill: 0 - HYDROcodone-acetaminophen (NORCO) 10-325 MG tablet; Take 1 tablet by mouth every 6 (six) hours as needed.  Dispense: 30 tablet; Refill: 0  2. Acute non-recurrent frontal sinusitis - methylPREDNISolone acetate (DEPO-MEDROL) injection 80 mg - doxycycline (VIBRA-TABS) 100 MG tablet; Take 1 tablet (100 mg total) by mouth 2 (two) times daily.  Dispense: 20 tablet; Refill: 0    Current Outpatient Medications:  .  albuterol (PROVENTIL HFA;VENTOLIN HFA) 108 (90 Base) MCG/ACT inhaler, Inhale 2 puffs into the lungs every 6 (six) hours as needed for wheezing or shortness of breath., Disp: 1 Inhaler, Rfl: 2 .  aspirin EC 81 MG tablet, Take 81 mg by mouth daily., Disp: , Rfl:  .  azithromycin (ZITHROMAX) 250 MG tablet, Take 2 the first day and then one each day after., Disp: 6 tablet, Rfl: 0 .  budesonide-formoterol (SYMBICORT)  160-4.5 MCG/ACT inhaler, Inhale 2 puffs into the lungs 2 (two) times daily., Disp: 10.2 g, Rfl: 4 .  buPROPion (WELLBUTRIN SR) 150 MG 12 hr tablet, TAKE (1) TABLET TWICE A DAY., Disp: 60 tablet, Rfl: 2 .  carvedilol (COREG) 6.25 MG tablet, TAKE  (1)  TABLET TWICE A DAY WITH MEALS (BREAKFAST AND SUPPER), Disp: 60 tablet, Rfl: 0 .  cyclobenzaprine (FLEXERIL) 10 MG tablet, Take 1 tablet (10 mg total) by mouth 3 (three) times daily as needed for muscle spasms., Disp: 30 tablet, Rfl: 0 .  doxycycline (VIBRA-TABS) 100 MG tablet, Take 1 tablet (100 mg total) by mouth 2 (two) times daily., Disp: 20 tablet, Rfl: 0 .  DYMISTA 137-50 MCG/ACT SUSP, One spray per nostril twice a day, Disp: 23 g, Rfl: 5 .  escitalopram (LEXAPRO) 10 MG tablet, Take 1 tablet (10 mg total) by mouth daily., Disp: 30 tablet, Rfl: 3 .  esomeprazole (NEXIUM) 40 MG capsule, Take 1 capsule (40 mg total) by mouth daily before breakfast., Disp: 90 capsule, Rfl: 1 .   FLUZONE HIGH-DOSE 0.5 ML injection, , Disp: , Rfl:  .  furosemide (LASIX) 20 MG tablet, Take 1/2 tablet (10 mg total) by mouth daily., Disp: 30 tablet, Rfl: 5 .  HYDROcodone-acetaminophen (NORCO) 10-325 MG tablet, Take 1 tablet by mouth every 6 (six) hours as needed., Disp: 30 tablet, Rfl: 0 .  ibuprofen (ADVIL,MOTRIN) 600 MG tablet, Take 1 tablet (600 mg total) by mouth every 8 (eight) hours as needed., Disp: 30 tablet, Rfl: 0 .  loratadine (CLARITIN) 10 MG tablet, Take 1 tablet (10 mg total) by mouth daily., Disp: 30 tablet, Rfl: 5 .  losartan (COZAAR) 25 MG tablet, Take 1 tablet (25 mg total) by mouth daily., Disp: 30 tablet, Rfl: 5 .  losartan (COZAAR) 50 MG tablet, Take 1/2 tablet (25 mg total) by mouth daily., Disp: 45 tablet, Rfl: 0 .  meclizine (ANTIVERT) 25 MG tablet, Take 1 tablet (25 mg total) by mouth 3 (three) times daily as needed for dizziness., Disp: 30 tablet, Rfl: 0 .  Multiple Vitamins-Minerals (CENTRUM SILVER PO), Take 1 tablet by mouth daily., Disp: , Rfl:  .  ondansetron (ZOFRAN) 4 MG tablet, Take 1 tablet every 8 hours as needed for nausea or vomiting., Disp: 20 tablet, Rfl: 0 .  polyethylene glycol powder (GLYCOLAX/MIRALAX) powder, Take 17 g by mouth 2 (two) times daily as needed., Disp: 3350 g, Rfl: 1 .  raloxifene (EVISTA) 60 MG tablet, TAKE 1 TABLET DAILY, Disp: 90 tablet, Rfl: 0 .  triamcinolone (KENALOG) 0.025 % ointment, Apply 1 application topically 2 (two) times daily., Disp: 30 g, Rfl: 0 Continue all other maintenance medications as listed above.  Follow up plan: Follow-up as needed or worsening of symptoms. Call office for any issues.  Educational handout given for Wilcox PA-C [facility] [address]  [place], [place] [address] [phone]   04/29/2017, 1:54 PM

## 2017-05-01 ENCOUNTER — Other Ambulatory Visit: Payer: Self-pay | Admitting: Pediatrics

## 2017-05-01 DIAGNOSIS — M810 Age-related osteoporosis without current pathological fracture: Secondary | ICD-10-CM

## 2017-05-02 ENCOUNTER — Encounter: Payer: Self-pay | Admitting: Pediatrics

## 2017-05-02 ENCOUNTER — Ambulatory Visit (INDEPENDENT_AMBULATORY_CARE_PROVIDER_SITE_OTHER): Payer: Medicare Other | Admitting: Pediatrics

## 2017-05-02 VITALS — BP 134/84 | HR 85 | Temp 97.4°F | Ht 62.0 in | Wt 151.4 lb

## 2017-05-02 DIAGNOSIS — M25511 Pain in right shoulder: Secondary | ICD-10-CM

## 2017-05-02 NOTE — Progress Notes (Signed)
  Subjective:   Patient ID: [name], female    DOB: 07-Nov-1934, 83 y.o.   MRN: [id number] CC: Shoulder Pain  HPI: [name] is a 83 y.o. female presenting for Shoulder Pain  Continues to need to do fair amount of lifting/moving of her son who is wheelchair bound Was seen last week in clinic for shoulder pain, given steroid injection Minimally helpful this time Has been referred multiple times orthopedics, has not yet been seen them  No fevers, no numbness or tingling in her hand Right arm primarily is affected, patient is right-handed  No fevers, appetite has been fine  Relevant past medical, surgical, family and social history reviewed. Allergies and medications reviewed and updated. Social History   Tobacco Use  Smoking Status Current Some Day Smoker  . Packs/day: 0.10  . Years: 60.00  . Pack years: 6.00  . Types: Cigarettes  Smokeless Tobacco Never Used  Tobacco Comment   Smokes a cigarette maybe once a week at the most when she feel anxious   ROS: Per HPI   Objective:    BP 134/84   Pulse 85   Temp (!) 97.4 F (36.3 C) (Oral)   Ht 5\' 2"  (1.575 m)   Wt 151 lb 6.4 oz (68.7 kg)   BMI 27.69 kg/m   Wt Readings from Last 3 Encounters:  05/02/17 151 lb 6.4 oz (68.7 kg)  04/27/17 151 lb (68.5 kg)  04/16/17 157 lb (71.2 kg)   Gen: NAD, alert, cooperative with exam, NCAT EYES: EOMI, no conjunctival injection, or no icterus CV: NRRR, normal S1/S2 Resp: CTABL, no wheezes, normal WOB Ext: No edema, warm Neuro: Alert and oriented, strength equal b/l UE and LE, coordination grossly normal MSK: Symmetric shoulders, able to lift both arms over her head but with pain on the right side, able to abduct arm to 150 degrees versus left arm is 180 Shoulder tender to palpation anteriorly and posteriorly  Assessment & Plan:  [name] was seen today for shoulder pain.  Diagnoses and all orders for this visit:  Right anterior shoulder pain Has been ongoing for some  months, no fevers or systemic illness. Gave phone number for patient to call to set up orthopedic appointment, referral has already been made  Recommended avoiding use of strong pain medicine, patient said it made her too sleepy.  Follow up plan: Return in about 3 months (around 07/31/2017). 09/30/2017, MD Rex Kras [facility]

## 2017-05-02 NOTE — Patient Instructions (Signed)
Dr. [name] [phone]

## 2017-05-09 ENCOUNTER — Ambulatory Visit (INDEPENDENT_AMBULATORY_CARE_PROVIDER_SITE_OTHER): Payer: Medicare Other | Admitting: Orthopaedic Surgery

## 2017-05-09 ENCOUNTER — Encounter (INDEPENDENT_AMBULATORY_CARE_PROVIDER_SITE_OTHER): Payer: Self-pay | Admitting: Orthopaedic Surgery

## 2017-05-09 VITALS — BP 132/79 | HR 73 | Ht 62.0 in | Wt 150.0 lb

## 2017-05-09 DIAGNOSIS — M19011 Primary osteoarthritis, right shoulder: Secondary | ICD-10-CM

## 2017-05-09 MED ORDER — METHYLPREDNISOLONE ACETATE 40 MG/ML IJ SUSP
40.0000 mg | INTRAMUSCULAR | Status: AC | PRN
  Administered 2017-05-09: 40 mg via INTRA_ARTICULAR

## 2017-05-09 MED ORDER — LIDOCAINE HCL 1 % IJ SOLN
1.0000 mL | INTRAMUSCULAR | Status: AC | PRN
Start: 2017-05-09 — End: 2017-05-09
  Administered 2017-05-09: 1 mL

## 2017-05-09 MED ORDER — BUPIVACAINE HCL 0.25 % IJ SOLN
4.0000 mL | INTRAMUSCULAR | Status: AC | PRN
  Administered 2017-05-09: 4 mL via INTRA_ARTICULAR

## 2017-05-09 MED ORDER — LIDOCAINE HCL 1 % IJ SOLN
0.5000 mL | INTRAMUSCULAR | Status: AC | PRN
  Administered 2017-05-09: .5 mL

## 2017-05-09 NOTE — Progress Notes (Signed)
Office Visit Note   Patient: [name]           Date of Birth: [birth year]           MRN: [id number] Visit Date: 05/09/2017              Requested by: [name], MD [address] [place], [place] [address] PCP: [name], MD   Assessment & Plan: Visit Diagnoses:  1. Primary osteoarthritis of right shoulder     Plan: Right shoulder injected posteriorly into the glenohumeral joint she tolerated this well with improvement in her pain.  I will recheck her in 6 weeks.  We reviewed the x-rays which showed primary shoulder osteoarthritis.  Follow-Up Instructions: Return in about 6 weeks (around 06/20/2017).   Orders:  Orders Placed This Encounter  Procedures  . Large Joint Inj: R glenohumeral   No orders of the defined types were placed in this encounter.     Procedures: Large Joint Inj: R glenohumeral on 05/09/2017 11:27 AM Indications: pain Details: 22 G 1.5 in needle  Arthrogram: No  Medications: 4 mL bupivacaine 0.25 %; 40 mg methylPREDNISolone acetate 40 MG/ML; 0.5 mL lidocaine 1 %; 1 mL lidocaine 1 % Outcome: tolerated well, no immediate complications Procedure, treatment alternatives, risks and benefits explained, specific risks discussed. Consent was given by the patient. Immediately prior to procedure a time out was called to verify the correct patient, procedure, equipment, support staff and site/side marked as required. Patient was prepped and draped in the usual sterile fashion.       Clinical Data: No additional findings.   Subjective: Chief Complaint  Patient presents with  . Right Shoulder - Pain    HPI old female seen with right shoulder pain progressive over the last 3 months.  She had some problems in the distant past had rotator cuff repair in 2011.  She helps her adult son who has bilateral below-knee amputations.  She is used heat and Advil Aleve.  She had a joint injection 3 months ago which sounds like it was a subacromial  injection.  She is had previous decompression for lumbar stenosis done in [place] also has had a total hip arthroplasty both are doing well.  She denies fever or chills she has pain with outstretched reaching overhead activities sometimes it bothers her when she sleeps or uses her hand.  She is right-hand dominant.  Review of Systems point review of system positive for disc degeneration arthroplasty secondary cardiomyopathy lumbar decompression anxiety and depression, GERD, hypertension, overactive bladder, right shoulder osteoarthritis otherwise negative as it pertains to  HPI.   Objective: Vital Signs: BP 132/79   Pulse 73   Ht 5\' 2"  (1.575 m)   Wt 150 lb (68 kg)   BMI 27.44 kg/m   Physical Exam  Constitutional: She is oriented to person, place, and time. She appears well-developed.  HENT:  Head: Normocephalic.  Right Ear: External ear normal.  Left Ear: External ear normal.  Eyes: Pupils are equal, round, and reactive to light.  Neck: No tracheal deviation present. No thyromegaly present.  Cardiovascular: Normal rate.  Pulmonary/Chest: Effort normal.  Abdominal: Soft.  Neurological: She is alert and oriented to person, place, and time.  Skin: Skin is warm and dry.  Psychiatric: She has a normal mood and affect. Her behavior is normal.    Ortho Exam Crepitus with right shoulder range of motion she is able to get her hand to the top of her  head.  Drop arm test.  Reproduction of pain with internal/external rotation of her shoulder.  Posterior axillary line with some discomfort.  Crepitus with shoulder motion the right none on the left.  Reaches full extension sensation in the hand is normal.  Minimal brachial plexus tenderness.  Specialty Comments:  No specialty comments available.  Imaging: CLINICAL DATA:  Shoulder pain  EXAM: RIGHT SHOULDER - 2+ VIEW  COMPARISON:  Chest x-ray of 04/05/2016 and 11/14/2015  FINDINGS: There has been progression of degenerative joint  disease involving the right shoulder with considerable loss of joint space, sclerosis, and spurring present. There may be small loose bodies within the joint space as well. No acute fracture is seen. No dislocation is noted. The right AC joint is normally aligned with apparent partial resection of the distal right clavicle  IMPRESSION: Significant interval progression of degenerative joint disease of the right shoulder. Cannot exclude small loose bodies within the right shoulder joint space.   Electronically Signed   By: Dwyane Dee M.D.   On: 11/22/2016 10:06    PMFS History: Patient Active Problem List   Diagnosis Date Noted  . Chronic right shoulder pain 12/27/2016  . Constipation 04/11/2016  . Chronic systolic CHF (congestive heart failure) (HCC) 04/07/2016  . Hyperglycemia 04/05/2016  . Syncope 11/15/2015  . Nausea and vomiting 11/15/2015  . Dehydration 10/05/2015  . UTI (lower urinary tract infection) 10/05/2015  . Mild dementia 09/13/2015  . Memory problem 05/07/2015  . Neck pain 04/06/2015  . Insomnia 12/10/2014  . BMI 28.0-28.9,adult 12/10/2014  . Osteopenia 05/19/2014  . GAD (generalized anxiety disorder) 01/06/2014  . Depression 01/06/2014  . GERD (gastroesophageal reflux disease) 01/06/2014  . Vitamin D deficiency 01/06/2014  . Overactive bladder 01/06/2014  . Secondary cardiomyopathy (HCC) 10/06/2013  . Left bundle branch block 10/06/2013  . Dizziness 10/04/2013  . Osteoarthritis of right hip 03/14/2013  . Cough 02/01/2011  . Reflux 02/01/2011  . Allergic rhinitis 12/07/2008  . Chronic cough 12/07/2008  . Hepatitis B virus infection 08/16/2008  . Hyperlipidemia 08/16/2008  . Essential hypertension 08/16/2008  . DISC DEGENERATION 07/24/2007   Past Medical History:  Diagnosis Date  . Allergic rhinitis   . Anxiety   . Arthritis   . Cataract    had eye surgery  . Depression   . Elevated troponin 04/05/2016  . Essential hypertension, benign     . GERD (gastroesophageal reflux disease)   . History of blood transfusion   . Left bundle branch block   . Memory loss   . Mixed hyperlipidemia   . Occipital neuralgia of left side   . Osteopenia   . 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 Urinary urgency     Family History  Problem Relation Age of Onset  . Diabetes Father   . Diabetes Brother   . Cancer Brother 93       colon  . Diabetes Brother   . Diabetes Brother   . Hypertension Sister   . Diabetes Sister   . Diabetes Sister   . Diabetes Other   . Allergies Other   . Diabetes Son   . Colon cancer Neg Hx   . Anesthesia problems Neg Hx   . Hypotension Neg Hx   . Malignant hyperthermia Neg Hx   . Pseudochol deficiency Neg Hx     Past Surgical History:  Procedure Laterality Date  . APPENDECTOMY    . BACK SURGERY    . CARDIAC  CATHETERIZATION  2010   Normal coronaries  . CATARACT EXTRACTION W/PHACO  04/16/2011   Procedure: CATARACT EXTRACTION PHACO AND INTRAOCULAR LENS PLACEMENT (IOC);  Surgeon: Susa Simmonds;  Location: [facility];  Service: Ophthalmology;  Laterality: Left;  CDE=11.35  . CATARACT EXTRACTION W/PHACO  01/28/2012   Procedure: CATARACT EXTRACTION PHACO AND INTRAOCULAR LENS PLACEMENT (IOC);  Surgeon: Susa Simmonds, MD;  Location: [facility];  Service: Ophthalmology;  Laterality: Right;  CDI:8.15  . CYSTO WITH HYDRODISTENSION  05/29/2012   Procedure: CYSTOSCOPY/HYDRODISTENSION;  Surgeon: Martina Sinner, MD;  Location: [facility];  Service: Urology;  Laterality: N/A;  INSTILLATION OF MARCAINE AND PYRIDIUM   . DILATION AND CURETTAGE OF UTERUS  ?<hysterectomy"  . HAMMER TOE SURGERY Bilateral 11/22/11   [facility], [name]  . JOINT REPLACEMENT    . LAPAROSCOPIC CHOLECYSTECTOMY  1991  . LUMBAR LAMINECTOMY  2011  . LUMBAR LAMINECTOMY/DECOMPRESSION MICRODISCECTOMY Left 06/06/2012   Procedure: LUMBAR LAMINECTOMY/DECOMPRESSION MICRODISCECTOMY 1 LEVEL;  Surgeon: Maeola Harman, MD;   Location: [facility];  Service: Neurosurgery;  Laterality: Left;  Left Lumbar two-three Laminectomy for resection of synovial cyst  . SHOULDER OPEN ROTATOR CUFF REPAIR Right 2011  . TONSILLECTOMY    . TOTAL ABDOMINAL HYSTERECTOMY    . TOTAL HIP ARTHROPLASTY Right 03/13/2013   Procedure: TOTAL HIP ARTHROPLASTY;  Surgeon: Nestor Lewandowsky, MD;  Location: [facility];  Service: Orthopedics;  Laterality: Right;   Social History   Occupational History  . Occupation: Worked in Geneticist, molecular for 30 years    Employer: RETIRED    Comment: Retired  . Occupation: Part time care giver  Tobacco Use  . Smoking status: Current Some Day Smoker    Packs/day: 0.10    Years: 60.00    Pack years: 6.00    Types: Cigarettes  . Smokeless tobacco: Never Used  . Tobacco comment: Smokes a cigarette maybe once a week at the most when she feel anxious  Substance and Sexual Activity  . Alcohol use: No    Alcohol/week: 0.0 oz  . Drug use: No  . Sexual activity: No

## 2017-05-20 ENCOUNTER — Other Ambulatory Visit: Payer: Self-pay | Admitting: Pediatrics

## 2017-05-20 DIAGNOSIS — Z87891 Personal history of nicotine dependence: Secondary | ICD-10-CM

## 2017-05-21 ENCOUNTER — Other Ambulatory Visit: Payer: Self-pay | Admitting: Pediatrics

## 2017-05-30 ENCOUNTER — Ambulatory Visit (INDEPENDENT_AMBULATORY_CARE_PROVIDER_SITE_OTHER): Payer: Medicare Other | Admitting: Pediatrics

## 2017-05-30 ENCOUNTER — Encounter: Payer: Self-pay | Admitting: Pediatrics

## 2017-05-30 VITALS — BP 136/78 | HR 75 | Temp 97.6°F | Ht 62.0 in | Wt 152.4 lb

## 2017-05-30 DIAGNOSIS — J449 Chronic obstructive pulmonary disease, unspecified: Secondary | ICD-10-CM

## 2017-05-30 DIAGNOSIS — J069 Acute upper respiratory infection, unspecified: Secondary | ICD-10-CM

## 2017-05-30 NOTE — Progress Notes (Signed)
  Subjective:   Patient ID: [name], female    DOB: [birth year], 83 y.o.   MRN: [id number] CC: Cough and Nasal Congestion  HPI: [name] is a 83 y.o. female presenting for Cough and Nasal Congestion  Past 3 days has been sick Using inhaler more often, about 2-3 times a day Stopped smoking past week Inhaler helps when she feels SOB, usually happens with walking No chest pain or pressure with exertion  No fevers Appetite down some, eating some daily No sore throat Some wheezing  Relevant past medical, surgical, family and social history reviewed. Allergies and medications reviewed and updated. Social History   Tobacco Use  Smoking Status Current Some Day Smoker  . Packs/day: 0.10  . Years: 60.00  . Pack years: 6.00  . Types: Cigarettes  Smokeless Tobacco Never Used  Tobacco Comment   Smokes a cigarette maybe once a week at the most when she feel anxious   ROS: Per HPI   Objective:    BP 136/78   Pulse 75   Temp 97.6 F (36.4 C) (Oral)   Ht 5\' 2"  (1.575 m)   Wt 152 lb 6.4 oz (69.1 kg)   BMI 27.87 kg/m   Wt Readings from Last 3 Encounters:  05/30/17 152 lb 6.4 oz (69.1 kg)  05/09/17 150 lb (68 kg)  05/02/17 151 lb 6.4 oz (68.7 kg)    Gen: NAD, alert, cooperative with exam, NCAT, congested EYES: EOMI, no conjunctival injection, or no icterus ENT:  TMs dull gray b/l, OP without erythema LYMPH: no cervical LAD CV: NRRR, normal S1/S2, no murmur, distal pulses 2+ b/l Resp: CTABL, no wheezes, normal WOB Abd: +BS, soft, NTND. no guarding or organomegaly Ext: No edema, warm Neuro: Alert and oriented, strength equal b/l UE and LE, coordination grossly normal MSK: normal muscle bulk  Assessment & Plan:  [name] was seen today for cough and nasal congestion.  Diagnoses and all orders for this visit:  Acute URI Symptom care, return precautions discussed  Chronic obstructive pulmonary disease, unspecified COPD type (HCC) No wheezing, cont albuterol  prn  Follow up plan: Return in about 3 months (around 08/27/2017). 08/29/2017, MD Rex K[facility]

## 2017-06-04 ENCOUNTER — Telehealth: Payer: Self-pay | Admitting: Pediatrics

## 2017-06-04 NOTE — Telephone Encounter (Signed)
appt scheduled Pt notified 

## 2017-06-05 ENCOUNTER — Encounter: Payer: Self-pay | Admitting: Pediatrics

## 2017-06-05 ENCOUNTER — Telehealth: Payer: Self-pay | Admitting: Pediatrics

## 2017-06-05 ENCOUNTER — Ambulatory Visit (INDEPENDENT_AMBULATORY_CARE_PROVIDER_SITE_OTHER): Payer: Medicare HMO | Admitting: Pediatrics

## 2017-06-05 VITALS — BP 133/76 | HR 79 | Temp 97.2°F | Ht 62.0 in | Wt 151.8 lb

## 2017-06-05 DIAGNOSIS — J069 Acute upper respiratory infection, unspecified: Secondary | ICD-10-CM

## 2017-06-05 NOTE — Progress Notes (Addendum)
  Subjective:   Patient ID: [name], female    DOB: Jun 07, 1934, 83 y.o.   MRN: [id number] CC: Cough and Nasal Congestion  HPI: [name] is a 83 y.o. female presenting for Cough and Nasal Congestion  No fevers in the last few days Appetite has been fine  Cough bothering her the most Has drainage down back of throat at times which is bothering her the most  Not smoked any last few days  Relevant past medical, surgical, family and social history reviewed. Allergies and medications reviewed and updated. Social History   Tobacco Use  Smoking Status Current Some Day Smoker  . Packs/day: 0.10  . Years: 60.00  . Pack years: 6.00  . Types: Cigarettes  Smokeless Tobacco Never Used  Tobacco Comment   Smokes a cigarette maybe once a week at the most when she feel anxious   ROS: Per HPI   Objective:    BP 133/76   Pulse 79   Temp (!) 97.2 F (36.2 C) (Oral)   Ht 5\' 2"  (1.575 m)   Wt 151 lb 12.8 oz (68.9 kg)   BMI 27.76 kg/m   Wt Readings from Last 3 Encounters:  06/05/17 151 lb 12.8 oz (68.9 kg)  05/30/17 152 lb 6.4 oz (69.1 kg)  05/09/17 150 lb (68 kg)    Gen: NAD, alert, cooperative with exam, NCAT EYES: EOMI, no conjunctival injection, or no icterus ENT:  TMs pearly gray b/l, OP without erythema LYMPH: no cervical LAD CV: NRRR, normal S1/S2, no murmur, distal pulses 2+ b/l Resp: CTABL, no wheezes, normal WOB Abd: +BS, soft, NTND. no guarding or organomegaly Ext: No edema, warm Neuro: Alert and oriented, strength equal b/l UE and LE, coordination grossly normal  Assessment & Plan:  [name] was seen today for cough and nasal congestion.  Diagnoses and all orders for this visit:  Acute URI Improving thought still with ongoing symptoms, discussed symptom care  Follow up plan: Return if symptoms worsen or fail to improve. [name], MD Rex K[facility]

## 2017-06-05 NOTE — Patient Instructions (Addendum)
Use albuterol 2 times a day  Use salt water nose spray as needed for sinus drainage Can also try neti pot sinus rinses for more effective sinus rinsing. Use with distilled water.

## 2017-06-05 NOTE — Telephone Encounter (Signed)
[name] is on patient's hippa form.  She would like to discuss mother's visit.

## 2017-06-06 ENCOUNTER — Other Ambulatory Visit: Payer: Self-pay | Admitting: Pediatrics

## 2017-06-06 DIAGNOSIS — J45909 Unspecified asthma, uncomplicated: Secondary | ICD-10-CM

## 2017-06-07 NOTE — Telephone Encounter (Signed)
Discussed with daughter 06/05/2017. She is worried about her mother's memory and fatigue. Can you schedule appointment for pt with me next week to discuss fatigue? We will get blood work at that appointment, she does not need to be fasting.

## 2017-06-11 NOTE — Telephone Encounter (Signed)
Patient has a follow up appointment scheduled. 

## 2017-06-11 NOTE — Telephone Encounter (Signed)
rc for [facility] nurse or call work no [phone]

## 2017-06-12 ENCOUNTER — Ambulatory Visit (INDEPENDENT_AMBULATORY_CARE_PROVIDER_SITE_OTHER): Payer: Medicare HMO | Admitting: Pediatrics

## 2017-06-12 ENCOUNTER — Encounter: Payer: Self-pay | Admitting: Pediatrics

## 2017-06-12 VITALS — BP 138/72 | HR 84 | Temp 98.0°F | Ht 62.0 in | Wt 152.0 lb

## 2017-06-12 DIAGNOSIS — E559 Vitamin D deficiency, unspecified: Secondary | ICD-10-CM

## 2017-06-12 DIAGNOSIS — R5383 Other fatigue: Secondary | ICD-10-CM | POA: Diagnosis not present

## 2017-06-12 DIAGNOSIS — F03A0 Unspecified dementia, mild, without behavioral disturbance, psychotic disturbance, mood disturbance, and anxiety: Secondary | ICD-10-CM

## 2017-06-12 DIAGNOSIS — M8580 Other specified disorders of bone density and structure, unspecified site: Secondary | ICD-10-CM | POA: Diagnosis not present

## 2017-06-12 DIAGNOSIS — F0390 Unspecified dementia without behavioral disturbance: Secondary | ICD-10-CM

## 2017-06-12 DIAGNOSIS — R69 Illness, unspecified: Secondary | ICD-10-CM | POA: Diagnosis not present

## 2017-06-12 DIAGNOSIS — I5042 Chronic combined systolic (congestive) and diastolic (congestive) heart failure: Secondary | ICD-10-CM | POA: Diagnosis not present

## 2017-06-12 DIAGNOSIS — R6889 Other general symptoms and signs: Secondary | ICD-10-CM | POA: Diagnosis not present

## 2017-06-12 DIAGNOSIS — E785 Hyperlipidemia, unspecified: Secondary | ICD-10-CM

## 2017-06-12 NOTE — Progress Notes (Signed)
Subjective:   Patient ID: [name], female    DOB: 01-20-1935, 83 y.o.   MRN: [id number] CC: Fatigue  HPI: [name] is a 83 y.o. female presenting for Fatigue  Says she hasnt been feeling well for the past two months. Feeling tired. When she walks to the mailbox has to pause after she gets back to catch her breath. Has to keep taking breaks while doing things.  Yesterday fixed breakfast, then sat down to rest. Washed the dishes then had to sit down. Had to sit down 3 times while making the bed yesterday. She says this is typical of the last few weeks.  Daughters pay her bills. She primarily looks after son, cooks some  HLD: per chart review cholesterol medicine was stopped in 09/2015 when pt was trying to get off of extra medications  CHF: last ECHO 09/2015, EF 50-53%, grade I diastolic dysfunction   MMSE - Mini Mental State Exam 06/12/2017 02/14/2017 06/04/2016  Orientation to time _0 Orientation to Place _1 Registration _2 Attention/ Calculation 4 0 2  Recall _3 Language- name 2 objects _4 Language- repeat 1 0 1  Language- follow 3 step command _5 Language- read & follow direction 1 1 0  Write a sentence _6 Copy design 1 0 1  Total score _7 Relevant past medical, surgical, family and social history reviewed. Allergies and medications reviewed and updated. Social History   Tobacco Use  Smoking Status Current Some Day Smoker  . Packs/day: 0.10  . Years: 60.00  . Pack years: 6.00  . Types: Cigarettes  Smokeless Tobacco Never Used  Tobacco Comment   Smokes a cigarette maybe once a week at the most when she feel anxious   ROS: Per HPI   Objective:    BP 138/72   Pulse 84   Temp 98 F (36.7 C) (Oral)   Ht _8  (1.575 m)   Wt 152 lb (68.9 kg)   BMI 27.80 kg/m   Wt Readings from Last 3 Encounters:  06/12/17 152 lb (68.9 kg)  06/05/17 151 lb 12.8 oz (68.9 kg)  05/30/17 152 lb 6.4 oz (69.1 kg)    Gen: NAD,  alert, cooperative with exam, NCAT EYES: EOMI, no conjunctival injection, or no icterus ENT:  OP without erythema LYMPH: no cervical LAD CV: NRRR, normal S1/S2, no murmur, distal pulses 2+ b/l Resp: CTABL, no wheezes, normal WOB Abd: +BS, soft, NTND. no guarding or organomegaly Ext: No edema, warm Neuro: Alert and oriented, strength equal b/l UE and LE, coordination grossly normal MSK: normal muscle bulk Psych: full affect  Assessment & Plan:  [name] was seen today for fatigue.  Diagnoses and all orders for this visit:  Other fatigue Will check blood work, repeat ECHO. COPD has been stable recently but also possible could be contributing. She thinks the claritin is helping some with nasal symptoms, will leave for now but consider dc vs prn in future rather than having in pill pack. Has had several URIs over the past few weeks.  -     CMP14+EGFR -     CBC with Differential/Platelet -     TSH -     Vitamin B12 -     ECHOCARDIOGRAM COMPLETE; Future  Vitamin D deficiency -     VITAMIN D 25 Hydroxy (Vit-D Deficiency, Fractures)  Osteopenia,  unspecified location On bisphosphonate, cont  Mild dementia Has not tolerated aricept or namenda in the past. MMSE stable to slightly improved.  Hyperlipidemia, unspecified hyperlipidemia type repeat cholesterol -     Cholesterol, total -     HDL cholesterol -     LDL Cholesterol, Direct  Chronic combined systolic and diastolic congestive heart failure (HCC) Will repeat ECHO as above given symptoms.  Follow up plan: No Follow-up on file. Assunta Found, MD [place]

## 2017-06-12 NOTE — Patient Instructions (Signed)
We will get labs, get an ultrasound of your heart, make sure you keep appointment to see your heart doctor next month.

## 2017-06-13 LAB — CBC WITH DIFFERENTIAL/PLATELET
BASOS ABS: 0 10*3/uL (ref 0.0–0.2)
Basos: 0 %
EOS (ABSOLUTE): 0.1 10*3/uL (ref 0.0–0.4)
EOS: 1 %
HEMATOCRIT: 42.1 % (ref 34.0–46.6)
Hemoglobin: 13.8 g/dL (ref 11.1–15.9)
Immature Grans (Abs): 0 10*3/uL (ref 0.0–0.1)
Immature Granulocytes: 0 %
LYMPHS ABS: 2 10*3/uL (ref 0.7–3.1)
Lymphs: 30 %
MCH: 30.5 pg (ref 26.6–33.0)
MCHC: 32.8 g/dL (ref 31.5–35.7)
MCV: 93 fL (ref 79–97)
MONOS ABS: 0.3 10*3/uL (ref 0.1–0.9)
Monocytes: 4 %
Neutrophils Absolute: 4.3 10*3/uL (ref 1.4–7.0)
Neutrophils: 65 %
Platelets: 308 10*3/uL (ref 150–379)
RBC: 4.53 x10E6/uL (ref 3.77–5.28)
RDW: 14.9 % (ref 12.3–15.4)
WBC: 6.7 10*3/uL (ref 3.4–10.8)

## 2017-06-13 LAB — CMP14+EGFR
ALBUMIN: 4.2 g/dL (ref 3.5–4.7)
ALK PHOS: 109 IU/L (ref 39–117)
ALT: 16 IU/L (ref 0–32)
AST: 23 IU/L (ref 0–40)
Albumin/Globulin Ratio: 1.7 (ref 1.2–2.2)
BUN / CREAT RATIO: 9 — AB (ref 12–28)
BUN: 10 mg/dL (ref 8–27)
CHLORIDE: 108 mmol/L — AB (ref 96–106)
CO2: 22 mmol/L (ref 20–29)
Calcium: 9.1 mg/dL (ref 8.7–10.3)
Creatinine, Ser: 1.06 mg/dL — ABNORMAL HIGH (ref 0.57–1.00)
GFR calc Af Amer: 57 mL/min/{1.73_m2} — ABNORMAL LOW (ref >59)
GFR calc non Af Amer: 50 mL/min/{1.73_m2} — ABNORMAL LOW (ref >59)
GLOBULIN, TOTAL: 2.5 g/dL (ref 1.5–4.5)
Glucose: 101 mg/dL — ABNORMAL HIGH (ref 65–99)
Potassium: 3.4 mmol/L — ABNORMAL LOW (ref 3.5–5.2)
SODIUM: 146 mmol/L — AB (ref 134–144)
Total Protein: 6.7 g/dL (ref 6.0–8.5)

## 2017-06-13 LAB — TSH: TSH: 0.837 u[IU]/mL (ref 0.450–4.500)

## 2017-06-13 LAB — VITAMIN B12: Vitamin B-12: 1522 pg/mL — ABNORMAL HIGH (ref 232–1245)

## 2017-06-13 LAB — HDL CHOLESTEROL: HDL: 88 mg/dL (ref >39)

## 2017-06-13 LAB — VITAMIN D 25 HYDROXY (VIT D DEFICIENCY, FRACTURES): VIT D 25 HYDROXY: 20 ng/mL — AB (ref 30.0–100.0)

## 2017-06-13 LAB — LDL CHOLESTEROL, DIRECT: LDL Direct: 135 mg/dL — ABNORMAL HIGH (ref 0–99)

## 2017-06-13 LAB — CHOLESTEROL, TOTAL: Cholesterol, Total: 226 mg/dL — ABNORMAL HIGH (ref 100–199)

## 2017-06-20 ENCOUNTER — Ambulatory Visit (INDEPENDENT_AMBULATORY_CARE_PROVIDER_SITE_OTHER): Payer: Medicare HMO | Admitting: Orthopaedic Surgery

## 2017-06-21 ENCOUNTER — Telehealth: Payer: Self-pay | Admitting: Pediatrics

## 2017-06-21 NOTE — Telephone Encounter (Signed)
It appears that these labs were released to MyChart.  I will defer to her PCP to review, since she was the ordering provider.

## 2017-06-26 NOTE — Telephone Encounter (Signed)
Left message for [name] to call back.  Patient labs were essentially normal. Labs did show that patient is a little dehydrated and also vit d is low.  Patient needs to increase fluid intake and start taking OTC vit d supplement.  Per Dr. Oswaldo Done

## 2017-06-28 ENCOUNTER — Other Ambulatory Visit: Payer: Self-pay | Admitting: Pediatrics

## 2017-06-28 DIAGNOSIS — Z87891 Personal history of nicotine dependence: Secondary | ICD-10-CM

## 2017-06-28 DIAGNOSIS — F411 Generalized anxiety disorder: Secondary | ICD-10-CM

## 2017-06-30 NOTE — Progress Notes (Signed)
Cardiology Office Note   Date:  07/03/2017   ID:  [name], DOB 1934/08/14, MRN [id number]  PCP:  [name] Sheriff, MD  Cardiologist:   No primary care provider on file. Referring:  [name] Sheriff, MD   Chief Complaint  Patient presents with  . Shortness of Breath      History of Present Illness: [name] is a 83 y.o. female who presents for follow up of CHF.  I last saw her in 2016 for this. She has been having increasing SOB.  He reports that she is short of breath walking 25 yards to her mailbox.  When she gets back she has to sit down.  She recovers after a minute.  She does not describe any chest discomfort, neck or arm discomfort.  She is had no cough fevers or chills.  She does have any resting shortness of breath and she is not describing PND orthopnea.  She is had no palpitations, presyncope.  She is had no weight gain or edema.  She takes her inhalers when she is short of breath and she does hear a little wheezing.  She smokes a few cigarettes but has cut way back.  She has had no formal lung diagnosis that she recalls with that I can find.  I did review office notes and blood work has been fine including a TSH and CBC.  Of note she has significant stress taking care of her son who is a bilateral amputee.  She has no other family in the area.   Past Medical History:  Diagnosis Date  . Allergic rhinitis   . Anxiety   . Arthritis   . Cataract    had eye surgery  . Depression   . Elevated troponin 04/05/2016  . Essential hypertension, benign   . GERD (gastroesophageal reflux disease)   . History of blood transfusion   . Left bundle branch block   . Memory loss   . Mixed hyperlipidemia   . Occipital neuralgia of left side   . Osteopenia   . 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 Urinary urgency     Past Surgical History:  Procedure Laterality Date  . APPENDECTOMY    . BACK SURGERY    . CARDIAC CATHETERIZATION   2010   Normal coronaries  . CATARACT EXTRACTION W/PHACO  04/16/2011   Procedure: CATARACT EXTRACTION PHACO AND INTRAOCULAR LENS PLACEMENT (IOC);  Surgeon: Susa Simmonds;  Location: [facility];  Service: Ophthalmology;  Laterality: Left;  CDE=11.35  . CATARACT EXTRACTION W/PHACO  01/28/2012   Procedure: CATARACT EXTRACTION PHACO AND INTRAOCULAR LENS PLACEMENT (IOC);  Surgeon: Susa Simmonds, MD;  Location: [facility];  Service: Ophthalmology;  Laterality: Right;  CDI:8.15  . CYSTO WITH HYDRODISTENSION  05/29/2012   Procedure: CYSTOSCOPY/HYDRODISTENSION;  Surgeon: Martina Sinner, MD;  Location: [facility];  Service: Urology;  Laterality: N/A;  INSTILLATION OF MARCAINE AND PYRIDIUM   . DILATION AND CURETTAGE OF UTERUS  ?<hysterectomy"  . HAMMER TOE SURGERY Bilateral 11/22/11   [facility], [name]  . JOINT REPLACEMENT    . LAPAROSCOPIC CHOLECYSTECTOMY  1991  . LUMBAR LAMINECTOMY  2011  . LUMBAR LAMINECTOMY/DECOMPRESSION MICRODISCECTOMY Left 06/06/2012   Procedure: LUMBAR LAMINECTOMY/DECOMPRESSION MICRODISCECTOMY 1 LEVEL;  Surgeon: Maeola Harman, MD;  Location: [facility];  Service: Neurosurgery;  Laterality: Left;  Left Lumbar two-three Laminectomy for resection of synovial cyst  . SHOULDER OPEN ROTATOR CUFF REPAIR Right 2011  .  TONSILLECTOMY    . TOTAL ABDOMINAL HYSTERECTOMY    . TOTAL HIP ARTHROPLASTY Right 03/13/2013   Procedure: TOTAL HIP ARTHROPLASTY;  Surgeon: Nestor Lewandowsky, MD;  Location: [facility];  Service: Orthopedics;  Laterality: Right;     Current Outpatient Medications  Medication Sig Dispense Refill  . albuterol (PROVENTIL HFA;VENTOLIN HFA) 108 (90 Base) MCG/ACT inhaler Inhale 2 puffs into the lungs every 6 (six) hours as needed for wheezing or shortness of breath. 1 Inhaler 2  . aspirin EC 81 MG tablet Take 81 mg by mouth daily.    Marland Kitchen buPROPion (WELLBUTRIN SR) 150 MG 12 hr tablet TAKE (1) TABLET TWICE A DAY. 60 tablet 2  . carvedilol (COREG) 6.25 MG tablet TAKE  (1)  TABLET  TWICE A DAY WITH MEALS (BREAKFAST AND SUPPER) 60 tablet 4  . escitalopram (LEXAPRO) 10 MG tablet Take 1 tablet (10 mg total) by mouth daily. 30 tablet 2  . esomeprazole (NEXIUM) 40 MG capsule Take 1 capsule (40 mg total) by mouth daily before breakfast. 90 capsule 4  . furosemide (LASIX) 20 MG tablet Take 1/2 tablet (10 mg total) by mouth daily. 30 tablet 5  . loratadine (CLARITIN) 10 MG tablet Take 1 tablet (10 mg total) by mouth daily. 30 tablet 5  . losartan (COZAAR) 50 MG tablet Take 1/2 tablet (25 mg total) by mouth daily. 45 tablet 0  . Multiple Vitamins-Minerals (CENTRUM SILVER PO) Take 1 tablet by mouth daily.    . raloxifene (EVISTA) 60 MG tablet TAKE 1 TABLET DAILY 90 tablet 1  . SYMBICORT 160-4.5 MCG/ACT inhaler Inhale 2 puffs into the lungs 2 (two) times daily. 10.2 g 0  . DYMISTA 137-50 MCG/ACT SUSP One spray per nostril twice a day 23 g 5   No current facility-administered medications for this visit.     Allergies:   Lisinopril-hydrochlorothiazide; Aricept [donepezil hcl]; Codeine; Namenda [memantine hcl]; and Penicillins    ROS:  Please see the history of present illness.   Otherwise, review of systems are positive for none.   All other systems are reviewed and negative.    PHYSICAL EXAM: VS:  BP 140/80   Pulse 74   Ht 5\' 4"  (1.626 m)   Wt 156 lb (70.8 kg)   BMI 26.78 kg/m  , BMI Body mass index is 26.78 kg/m. GENERAL:  Well appearing HEENT:  Pupils equal round and reactive, fundi not visualized, oral mucosa unremarkable NECK:  No jugular venous distention, waveform within normal limits, carotid upstroke brisk and symmetric, no bruits, no thyromegaly LYMPHATICS:  No cervical, inguinal adenopathy LUNGS:  Clear to auscultation bilaterally CHEST:  Unremarkable HEART:  PMI not displaced or sustained,S1 and S2 within normal limits, no S3, no S4, no clicks, no rubs, no murmurs ABD:  Flat, positive bowel sounds normal in frequency in pitch, no bruits, no rebound, no  guarding, no midline pulsatile mass, no hepatomegaly, no splenomegaly EXT:  2 plus pulses throughout, no edema, no cyanosis no clubbing    EKG:  EKG is ordered today. The ekg ordered today demonstrates sinus rhythm, chronic left bundle branch block, rate 75, axis within normal limits, intervals within normal limits, no acute ST-T wave changes.   Recent Labs: 06/12/2017: ALT 16; BUN 10; Creatinine, Ser 1.06; Hemoglobin 13.8; Platelets 308; Potassium 3.4; Sodium 146; TSH 0.837    Lipid Panel    Component Value Date/Time   CHOL 226 (H) 06/12/2017 1503   CHOL 149 10/27/2012 1028   TRIG 93 12/10/2014 1553  TRIG 61 01/26/2013 0927   TRIG 72 10/27/2012 1028   HDL 88 06/12/2017 1503   HDL 76 01/26/2013 0927   HDL 59 10/27/2012 1028   CHOLHDL 2.5 12/10/2014 1553   LDLCALC 100 (H) 12/10/2014 1553   LDLCALC 98 01/26/2013 0927   LDLCALC 76 10/27/2012 1028   LDLDIRECT 135 (H) 06/12/2017 1503      Wt Readings from Last 3 Encounters:  07/03/17 156 lb (70.8 kg)  06/12/17 152 lb (68.9 kg)  06/05/17 151 lb 12.8 oz (68.9 kg)      Other studies Reviewed: Additional studies/ records that were reviewed today include: Office records.  2010 cath report. Review of the above records demonstrates:  Please see elsewhere in the note.     ASSESSMENT AND PLAN:  ACUTE ON CHRONIC SYSTOLIC AND DIASTOLIC HF: The patient does have shortness of breath and is not clear that this is heart failure or primary lung issue.  Less likely is ischemia.  Her previous Which was 9 years ago had normal coronaries.  I am going to start with a BNP level and an echocardiogram.  I will have a low threshold for ordering a stress test.  She would be able walk on a treadmill.  She will have a [employer].   HTN:  The blood pressure is at target. No change in medications is indicated. We will continue with therapeutic lifestyle changes (TLC).  CAROTID DOPPLER:   She had mild plaque in 2015.  I will repeat this study.      TOBACCO ABUSE:  We talked about quitting smoking completely.    Current medicines are reviewed at length with the patient today.  The patient does not have concerns regarding medicines.  The following changes have been made:  no change  Labs/ tests ordered today include:   Orders Placed This Encounter  Procedures  . US Carotid Duplex Bilateral  . Pro b natriuretic peptide (BNP)  . EKG 12-Lead     Disposition:   FU with me after the above studies.     Signed, Rollene Rotunda, MD  07/03/2017 10:48 AM    [facility]

## 2017-07-01 ENCOUNTER — Other Ambulatory Visit: Payer: Self-pay | Admitting: Pediatrics

## 2017-07-01 DIAGNOSIS — K219 Gastro-esophageal reflux disease without esophagitis: Secondary | ICD-10-CM

## 2017-07-03 ENCOUNTER — Ambulatory Visit (INDEPENDENT_AMBULATORY_CARE_PROVIDER_SITE_OTHER): Payer: Medicare Other | Admitting: Cardiology

## 2017-07-03 ENCOUNTER — Encounter: Payer: Self-pay | Admitting: Cardiology

## 2017-07-03 ENCOUNTER — Other Ambulatory Visit: Payer: Medicare HMO

## 2017-07-03 VITALS — BP 140/80 | HR 74 | Ht 64.0 in | Wt 156.0 lb

## 2017-07-03 DIAGNOSIS — R0609 Other forms of dyspnea: Secondary | ICD-10-CM

## 2017-07-03 DIAGNOSIS — I6529 Occlusion and stenosis of unspecified carotid artery: Secondary | ICD-10-CM

## 2017-07-03 DIAGNOSIS — I5022 Chronic systolic (congestive) heart failure: Secondary | ICD-10-CM | POA: Diagnosis not present

## 2017-07-03 DIAGNOSIS — I10 Essential (primary) hypertension: Secondary | ICD-10-CM | POA: Diagnosis not present

## 2017-07-03 DIAGNOSIS — R0602 Shortness of breath: Secondary | ICD-10-CM

## 2017-07-03 DIAGNOSIS — I739 Peripheral vascular disease, unspecified: Secondary | ICD-10-CM

## 2017-07-03 DIAGNOSIS — I779 Disorder of arteries and arterioles, unspecified: Secondary | ICD-10-CM | POA: Insufficient documentation

## 2017-07-03 NOTE — Patient Instructions (Addendum)
Medication Instructions:  The current medical regimen is effective;  continue present plan and medications.  Labwork: Please have blood work today  At [facility]. .  Testing/Procedures: Your physician has requested that you have an echocardiogram. Echocardiography is a painless test that uses sound waves to create images of your heart. It provides your doctor with information about the size and shape of your heart and how well your heart's chambers and valves are working. This procedure takes approximately one hour. There are no restrictions for this procedure.  Your physician has requested that you have a carotid duplex. This test is an ultrasound of the carotid arteries in your neck. It looks at blood flow through these arteries that supply the brain with blood. Allow one hour for this exam. There are no restrictions or special instructions. Both of these tests will be completed at [facility].  Follow-Up: Follow up with Dr Antoine Poche as scheduled in [place].  If you need a refill on your cardiac medications before your next appointment, please call your pharmacy.  Thank you for choosing [facility]!!

## 2017-07-04 ENCOUNTER — Telehealth: Payer: Self-pay | Admitting: Cardiology

## 2017-07-04 ENCOUNTER — Ambulatory Visit (INDEPENDENT_AMBULATORY_CARE_PROVIDER_SITE_OTHER): Payer: Medicare Other | Admitting: Orthopaedic Surgery

## 2017-07-04 ENCOUNTER — Encounter (INDEPENDENT_AMBULATORY_CARE_PROVIDER_SITE_OTHER): Payer: Self-pay | Admitting: Orthopaedic Surgery

## 2017-07-04 VITALS — BP 168/94 | HR 72

## 2017-07-04 DIAGNOSIS — I10 Essential (primary) hypertension: Secondary | ICD-10-CM

## 2017-07-04 DIAGNOSIS — I5022 Chronic systolic (congestive) heart failure: Secondary | ICD-10-CM

## 2017-07-04 DIAGNOSIS — M19011 Primary osteoarthritis, right shoulder: Secondary | ICD-10-CM | POA: Diagnosis not present

## 2017-07-04 DIAGNOSIS — Z79899 Other long term (current) drug therapy: Secondary | ICD-10-CM

## 2017-07-04 LAB — PRO B NATRIURETIC PEPTIDE: NT-Pro BNP: 1415 pg/mL — ABNORMAL HIGH (ref 0–738)

## 2017-07-04 MED ORDER — POTASSIUM CHLORIDE CRYS ER 20 MEQ PO TBCR: 20.0000 meq | EXTENDED_RELEASE_TABLET | Freq: Every day | ORAL | 3 refills | Status: DC

## 2017-07-04 MED ORDER — FUROSEMIDE 20 MG PO TABS: 20.0000 mg | ORAL_TABLET | Freq: Every day | ORAL | 3 refills | Status: DC

## 2017-07-04 NOTE — Addendum Note (Signed)
Addended by: Rogers Seeds on: 07/04/2017 02:38 PM   Modules accepted: Orders

## 2017-07-04 NOTE — Telephone Encounter (Signed)
New Message   Patient returning call in reference to labs. Please call to discuss.

## 2017-07-04 NOTE — Progress Notes (Signed)
Office Visit Note   Patient: [name]           Date of Birth: 1934/06/14           MRN: [id number] Visit Date: 07/04/2017              Requested by: [name], MD [address] [place], [place] [address] PCP: [name], MD   Assessment & Plan: Visit Diagnoses:  1. Primary osteoarthritis, right shoulder     Plan: Patient's used Tylenol, had intra-articular injection.  She has bone-on-bone changes in the right shoulder and wants to seed with total shoulder arthroplasty.  She has several loose bodies and with erosion of the glenohumeral joint is unlikely that shoulder arthroscopy would get over her much improvement.  I recommend an MRI of her shoulder for evaluation to evaluate her rotator cuff make sure it is adequate for even considering total shoulder arthroplasty.  Follow-up after scan for review.  Follow-Up Instructions: Return in about 2 months (around 09/03/2017).   Orders:  No orders of the defined types were placed in this encounter.  No orders of the defined types were placed in this encounter.     Procedures: No procedures performed   Clinical Data: No additional findings.   Subjective: Chief Complaint  Patient presents with  . Right Shoulder - Pain    HPI female returns with severe osteoarthritis right shoulder.  She only got relief for a day or 2 after the injection and the recurrence of symptoms.  Her son has to help her get her close on.  He is a bilateral amputee related to diabetes.  She has pain with outstretched reaching overhead activities.  He is lumbar fusion years ago also right total hip arthroplasty.  Both seem to be doing fairly well.  She denies significant problems with her left shoulder.  Is taken anti-inflammatories which she has to be careful with due to her cardiomyopathy.  He has had some recent dyspnea was just seen yesterday by her cardiologist.  Her shoulder is severe enough she is interested in considering total shoulder  arthroplasty.  Last cardiac echo I see was 2017.  He states the shoulder wakes her up at night.  Pain with activities of daily living.  For 2 days postinjection her shoulder was "fantastic".  Preinjection state with significant pain again that she rates as 7 or 8 out of 10.  Review of Systems positive for history of hyperlipidemia hypertension cardiomyopathy left bundle branch block, anxiety and depression, lumbar disc degeneration previous L4-5 fusion.  Right total hip arthroplasty.  Right shoulder osteoarthritis.  Hypertension, history of hepatitis B.  Recent mild dyspnea.  Carotid echo currently pending otherwise review of systems negative as it pertains HPI.   Objective: Vital Signs: BP (!) 168/94   Pulse 72   Physical Exam  Constitutional: She is oriented to person, place, and time. She appears well-developed.  HENT:  Head: Normocephalic.  Right Ear: External ear normal.  Left Ear: External ear normal.  Eyes: Pupils are equal, round, and reactive to light.  Neck: No tracheal deviation present. No thyromegaly present.  Cardiovascular: Normal rate.  Pulmonary/Chest: Effort normal.  Abdominal: Soft.  Neurological: She is alert and oriented to person, place, and time.  Skin: Skin is warm and dry.  Psychiatric: She has a normal mood and affect. Her behavior is normal.    Ortho Exam patient has limited right shoulder internal rotation with hand only to posterior axillary line.  She can flex to 60 degrees actively.  90 passively.  She has significant crepitus with internal and external rotation.  Acromioclavicular joint is minimally tender.  Long head of the biceps is palpable no distal biceps migration of the muscle.  Specialty Comments:  No specialty comments available.  Imaging: No results found.   PMFS History: Patient Active Problem List   Diagnosis Date Noted  . Stenosis of carotid artery 07/03/2017  . Shortness of breath 07/03/2017  . Chronic right shoulder pain 12/27/2016   . Constipation 04/11/2016  . Chronic systolic CHF (congestive heart failure) (HCC) 04/07/2016  . Hyperglycemia 04/05/2016  . Syncope 11/15/2015  . Nausea and vomiting 11/15/2015  . Dehydration 10/05/2015  . UTI (lower urinary tract infection) 10/05/2015  . Mild dementia 09/13/2015  . Memory problem 05/07/2015  . Neck pain 04/06/2015  . Insomnia 12/10/2014  . BMI 28.0-28.9,adult 12/10/2014  . Osteopenia 05/19/2014  . GAD (generalized anxiety disorder) 01/06/2014  . Depression 01/06/2014  . GERD (gastroesophageal reflux disease) 01/06/2014  . Vitamin D deficiency 01/06/2014  . Overactive bladder 01/06/2014  . Secondary cardiomyopathy (HCC) 10/06/2013  . Left bundle branch block 10/06/2013  . Dizziness 10/04/2013  . Osteoarthritis of right hip 03/14/2013  . Cough 02/01/2011  . Reflux 02/01/2011  . Allergic rhinitis 12/07/2008  . Chronic cough 12/07/2008  . Hepatitis B virus infection 08/16/2008  . Hyperlipidemia 08/16/2008  . Essential hypertension 08/16/2008  . DISC DEGENERATION 07/24/2007   Past Medical History:  Diagnosis Date  . Allergic rhinitis   . Anxiety   . Arthritis   . Cataract    had eye surgery  . Depression   . Elevated troponin 04/05/2016  . Essential hypertension, benign   . GERD (gastroesophageal reflux disease)   . History of blood transfusion   . Left bundle branch block   . Memory loss   . Mixed hyperlipidemia   . Occipital neuralgia of left side   . Osteopenia   . 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 Urinary urgency     Family History  Problem Relation Age of Onset  . Diabetes Father   . Diabetes Brother   . Cancer Brother 61       colon  . Diabetes Brother   . Diabetes Brother   . Hypertension Sister   . Diabetes Sister   . Diabetes Sister   . Diabetes Other   . Allergies Other   . Diabetes Son   . Colon cancer Neg Hx   . Anesthesia problems Neg Hx   . Hypotension Neg Hx   . Malignant hyperthermia  Neg Hx   . Pseudochol deficiency Neg Hx     Past Surgical History:  Procedure Laterality Date  . APPENDECTOMY    . BACK SURGERY    . CARDIAC CATHETERIZATION  2010   Normal coronaries  . CATARACT EXTRACTION W/PHACO  04/16/2011   Procedure: CATARACT EXTRACTION PHACO AND INTRAOCULAR LENS PLACEMENT (IOC);  Surgeon: Susa Simmonds;  Location: [facility];  Service: Ophthalmology;  Laterality: Left;  CDE=11.35  . CATARACT EXTRACTION W/PHACO  01/28/2012   Procedure: CATARACT EXTRACTION PHACO AND INTRAOCULAR LENS PLACEMENT (IOC);  Surgeon: Susa Simmonds, MD;  Location: [facility];  Service: Ophthalmology;  Laterality: Right;  CDI:8.15  . CYSTO WITH HYDRODISTENSION  05/29/2012   Procedure: CYSTOSCOPY/HYDRODISTENSION;  Surgeon: Martina Sinner, MD;  Location[facility];  Service: Urology;  Laterality: N/A;  INSTILLATION OF MARCAINE AND PYRIDIUM   .  DILATION AND CURETTAGE OF UTERUS  ?<hysterectomy"  . HAMMER TOE SURGERY Bilateral 11/22/11   [facility], [name]  . JOINT REPLACEMENT    . LAPAROSCOPIC CHOLECYSTECTOMY  1991  . LUMBAR LAMINECTOMY  2011  . LUMBAR LAMINECTOMY/DECOMPRESSION MICRODISCECTOMY Left 06/06/2012   Procedure: LUMBAR LAMINECTOMY/DECOMPRESSION MICRODISCECTOMY 1 LEVEL;  Surgeon: Maeola Harman, MD;  Location: [facility];  Service: Neurosurgery;  Laterality: Left;  Left Lumbar two-three Laminectomy for resection of synovial cyst  . SHOULDER OPEN ROTATOR CUFF REPAIR Right 2011  . TONSILLECTOMY    . TOTAL ABDOMINAL HYSTERECTOMY    . TOTAL HIP ARTHROPLASTY Right 03/13/2013   Procedure: TOTAL HIP ARTHROPLASTY;  Surgeon: Nestor Lewandowsky, MD;  Location: [facility];  Service: Orthopedics;  Laterality: Right;   Social History   Occupational History  . Occupation: Worked in Geneticist, molecular for 30 years    Employer: RETIRED    Comment: Retired  . Occupation: Part time care giver  Tobacco Use  . Smoking status: Current Some Day Smoker    Packs/day: 0.10    Years: 60.00    Pack  years: 6.00    Types: Cigarettes  . Smokeless tobacco: Never Used  . Tobacco comment: Smokes a cigarette maybe once a week at the most when she feel anxious  Substance and Sexual Activity  . Alcohol use: No    Alcohol/week: 0.0 oz  . Drug use: No  . Sexual activity: No

## 2017-07-04 NOTE — Telephone Encounter (Signed)
Spoke with patient who is aware to increase Furosemide to 40 mg for 2 days then decrease to 20 mg a day thereafter.  She is also aware to start K-Dur 20 MEQ daily.  She will repeat lab in 1 week at [facility].

## 2017-07-05 ENCOUNTER — Ambulatory Visit: Payer: Medicare Other | Admitting: Pediatrics

## 2017-07-08 ENCOUNTER — Ambulatory Visit (HOSPITAL_COMMUNITY)
Admission: RE | Admit: 2017-07-08 | Discharge: 2017-07-08 | Disposition: A | Discharge status: Home / Self Care | Payer: Medicare Other | Source: Ambulatory Visit | Attending: Cardiology | Admitting: Cardiology

## 2017-07-08 ENCOUNTER — Ambulatory Visit (HOSPITAL_COMMUNITY)
Admission: RE | Admit: 2017-07-08 | Discharge: 2017-07-08 | Disposition: A | Discharge status: Home / Self Care | Payer: Medicare Other | Source: Ambulatory Visit | Attending: Pediatrics | Admitting: Pediatrics

## 2017-07-08 DIAGNOSIS — R5383 Other fatigue: Secondary | ICD-10-CM | POA: Diagnosis not present

## 2017-07-08 DIAGNOSIS — I447 Left bundle-branch block, unspecified: Secondary | ICD-10-CM | POA: Insufficient documentation

## 2017-07-08 DIAGNOSIS — I6529 Occlusion and stenosis of unspecified carotid artery: Secondary | ICD-10-CM | POA: Insufficient documentation

## 2017-07-08 DIAGNOSIS — I081 Rheumatic disorders of both mitral and tricuspid valves: Secondary | ICD-10-CM | POA: Insufficient documentation

## 2017-07-08 DIAGNOSIS — I6523 Occlusion and stenosis of bilateral carotid arteries: Secondary | ICD-10-CM | POA: Insufficient documentation

## 2017-07-08 DIAGNOSIS — I5022 Chronic systolic (congestive) heart failure: Secondary | ICD-10-CM | POA: Insufficient documentation

## 2017-07-08 DIAGNOSIS — I429 Cardiomyopathy, unspecified: Secondary | ICD-10-CM | POA: Diagnosis not present

## 2017-07-08 DIAGNOSIS — F0390 Unspecified dementia without behavioral disturbance: Secondary | ICD-10-CM | POA: Insufficient documentation

## 2017-07-08 DIAGNOSIS — I110 Hypertensive heart disease with heart failure: Secondary | ICD-10-CM | POA: Diagnosis not present

## 2017-07-08 DIAGNOSIS — E785 Hyperlipidemia, unspecified: Secondary | ICD-10-CM | POA: Insufficient documentation

## 2017-07-08 DIAGNOSIS — K219 Gastro-esophageal reflux disease without esophagitis: Secondary | ICD-10-CM | POA: Insufficient documentation

## 2017-07-08 NOTE — Progress Notes (Signed)
*  PRELIMINARY RESULTS* Echocardiogram 2D Echocardiogram has been performed.  Stacey Drain 07/08/2017, 2:15 PM

## 2017-07-11 ENCOUNTER — Telehealth (INDEPENDENT_AMBULATORY_CARE_PROVIDER_SITE_OTHER): Payer: Self-pay | Admitting: Radiology

## 2017-07-11 NOTE — Telephone Encounter (Signed)
Received call from [name], patient's daughter, concerning MRI shoulder. She states that her mother called her confused in regards to a MRI being ordered and that she had already had the echocardiogram and EKG Dr. [name] ordered. The patient did not understand why an order was entered for the MRI. I explained to [name] that the patient most likely had confused her physician appts and that Dr. [name] did not order the echo or EKG.  She expressed understanding.  She would like for [place] to call her in regards to MRI report.  [phone] or [phone]

## 2017-07-12 ENCOUNTER — Ambulatory Visit (HOSPITAL_COMMUNITY)
Admission: RE | Admit: 2017-07-12 | Discharge: 2017-07-12 | Disposition: A | Discharge status: Home / Self Care | Payer: Medicare Other | Source: Ambulatory Visit | Attending: Orthopaedic Surgery | Admitting: Orthopaedic Surgery

## 2017-07-12 DIAGNOSIS — M19011 Primary osteoarthritis, right shoulder: Secondary | ICD-10-CM | POA: Diagnosis present

## 2017-07-12 DIAGNOSIS — M24011 Loose body in right shoulder: Secondary | ICD-10-CM | POA: Diagnosis not present

## 2017-07-12 DIAGNOSIS — M75101 Unspecified rotator cuff tear or rupture of right shoulder, not specified as traumatic: Secondary | ICD-10-CM | POA: Diagnosis not present

## 2017-07-15 ENCOUNTER — Ambulatory Visit (INDEPENDENT_AMBULATORY_CARE_PROVIDER_SITE_OTHER): Payer: Medicare Other | Admitting: Pediatrics

## 2017-07-15 ENCOUNTER — Encounter: Payer: Self-pay | Admitting: Pediatrics

## 2017-07-15 ENCOUNTER — Telehealth: Payer: Self-pay | Admitting: Pediatrics

## 2017-07-15 VITALS — BP 133/83 | HR 84 | Temp 97.4°F | Ht 64.0 in | Wt 154.2 lb

## 2017-07-15 DIAGNOSIS — F03A0 Unspecified dementia, mild, without behavioral disturbance, psychotic disturbance, mood disturbance, and anxiety: Secondary | ICD-10-CM

## 2017-07-15 DIAGNOSIS — F329 Major depressive disorder, single episode, unspecified: Secondary | ICD-10-CM | POA: Diagnosis not present

## 2017-07-15 DIAGNOSIS — G4700 Insomnia, unspecified: Secondary | ICD-10-CM | POA: Diagnosis not present

## 2017-07-15 DIAGNOSIS — F0390 Unspecified dementia without behavioral disturbance: Secondary | ICD-10-CM

## 2017-07-15 DIAGNOSIS — F32A Depression, unspecified: Secondary | ICD-10-CM

## 2017-07-15 DIAGNOSIS — E559 Vitamin D deficiency, unspecified: Secondary | ICD-10-CM | POA: Diagnosis not present

## 2017-07-15 DIAGNOSIS — I6523 Occlusion and stenosis of bilateral carotid arteries: Secondary | ICD-10-CM | POA: Diagnosis not present

## 2017-07-15 MED ORDER — PRAVASTATIN SODIUM 20 MG PO TABS
20.0000 mg | ORAL_TABLET | Freq: Every day | ORAL | 3 refills | Status: DC
Start: 2017-07-15 — End: 2017-08-07

## 2017-07-15 NOTE — Progress Notes (Signed)
Subjective:   Patient ID: [name], female    DOB: 12-Jun-1934, 83 y.o.   MRN: [id number] CC: Follow-up (3 month)  HPI: [name] is a 83 y.o. female presenting for Follow-up (3 month)  Here today with sister-in-law, a friend.  Says she has bothered 3-4 times a week by pain in the back left side of her head.  When she lies down for 20-30 minutes the pain gets relieved.  She thinks she is getting about 2-3 hours of sleep at night.  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  She often has to get up to help him on and off the toilet at night.  He has been sleeping pretty regularly.  There are plans to start having some more home help for him this week.  She is hoping that this will relieve some of the stress that she feels she is under being his sole caregiver.  Breathing has been fine.  Fatigue is somewhat better compared to last visit.  Mood has been okay.  She again is hopeful that extra help at home will help.  She has been taking her medicines regularly.  No thoughts of not wanting to be here anymore.  No fevers, no lightheadedness.  Relevant past medical, surgical, family and social history reviewed. Allergies and medications reviewed and updated. Social History   Tobacco Use  Smoking Status Current Some Day Smoker  . Packs/day: 0.10  . Years: 60.00  . Pack years: 6.00  . Types: Cigarettes  Smokeless Tobacco Never Used  Tobacco Comment   Smokes a cigarette maybe once a week at the most when she feel anxious   ROS: Per HPI   Objective:    BP 133/83   Pulse 84   Temp (!) 97.4 F (36.3 C) (Oral)   Ht 5\' 4"  (1.626 m)   Wt 154 lb 3.2 oz (69.9 kg)   BMI 26.47 kg/m   Wt Readings from Last 3 Encounters:  07/15/17 154 lb 3.2 oz (69.9 kg)  07/03/17 156 lb (70.8 kg)  06/12/17 152 lb (68.9 kg)    Gen: NAD, alert, cooperative with exam, NCAT EYES: EOMI, no conjunctival injection, or no icterus ENT:  OP without erythema LYMPH: no  cervical LAD CV: NRRR, normal S1/S2 Resp: CTABL, no wheezes, normal WOB Abd: +BS, soft, NTND. no guarding or organomegaly Ext: No edema, warm Neuro: Alert and oriented, strength equal b/l UE and LE, coordination grossly normal MSK: normal muscle bulk  Assessment & Plan:  [name] was seen today for follow-up multiple medical problems.  Diagnoses and all orders for this visit:  Bilateral carotid artery stenosis Recent Doppler ultrasound with atherosclerotic disease.  Patient not currently on a statin.  Will start below. -     pravastatin (PRAVACHOL) 20 MG tablet; Take 1 tablet (20 mg total) by mouth daily.  Vitamin D deficiency Encouraged over-the-counter daily vitamin D, 1000 IU.  Mild dementia Daughters are helping right now with finances.    Insomnia, unspecified type She is primary caregiver for her son 24 hours a day 7 days a week.  There are family members that she think she could have help her so she could get a good nights rest off and on.  Feels a lot more her normal self, well rested, less headaches when she gets sleep.  She is also going to talk with her son about how to help prepare him for the evening so he does not  have to get her up.  Depression, unspecified depression type Mood is stable.  Continue current medicines.  Any worsening let me know.  Follow up plan: Return in about 4 weeks (around 08/12/2017). Rex Kras, MD [place] [facility]

## 2017-07-15 NOTE — Telephone Encounter (Signed)
Check and see if we have release signed by pt so I can discuss it with her daughter. I looked and could not find it. Send back to me if we have release and I can call her late today, tonight or tomorrow. thanks

## 2017-07-15 NOTE — Patient Instructions (Addendum)
Try to lie down for 8 hours every night  Keep record of your headaches

## 2017-07-15 NOTE — Telephone Encounter (Signed)
I called discussed.  

## 2017-07-15 NOTE — Telephone Encounter (Signed)
Please see message below.  Patient had MRI on 07/12/17 and report is available in Epic.

## 2017-07-15 NOTE — Telephone Encounter (Signed)
There is a release in patient's chart giving consent to speak with her daughter.

## 2017-07-16 ENCOUNTER — Telehealth (INDEPENDENT_AMBULATORY_CARE_PROVIDER_SITE_OTHER): Payer: Self-pay | Admitting: Orthopaedic Surgery

## 2017-07-16 NOTE — Telephone Encounter (Signed)
Patient's daughter would like to speak to you.  CB#[phone]) or [phone]).  Thank you.

## 2017-07-16 NOTE — Telephone Encounter (Signed)
Spoke with daughter who wanted to know how yesterday's visit with you was, the results of her echocardiogram and also her carotid ultrasound.  I recommended to her that she contact Dr. Jenene Slicker office to discuss the echo and ultrasound.  I went over the notes from her mom's visit with you yesterday.  The daughter is concerned about the head pain she is having and reports that it does seem to be happening more frequently according to what her mom is telling her.  She said she has been diagnosed with occipital neuralgia in the past.  She has researched the causes and is concerned that it could possibly be more to it than nerve pain or arthritis causing this.  She would like for her mom to have an MRI if possible and would like to know what you think of this.  I explained to her you are out of the office until tomorrow, she is fine with that, would just like a call back whenever you get back and have a chance to review.

## 2017-07-16 NOTE — Telephone Encounter (Signed)
Contacted Tonya, she is unable to talk.  Will call back.

## 2017-07-16 NOTE — Telephone Encounter (Signed)
Daughter-Tonya is calling about pt appt yesterday with Oswaldo Done and her results from ekg. Please call back today she states she called twice yesterday and no call back. [phone] or [phone]

## 2017-07-17 ENCOUNTER — Encounter: Payer: Self-pay | Admitting: Pediatrics

## 2017-07-17 ENCOUNTER — Telehealth (INDEPENDENT_AMBULATORY_CARE_PROVIDER_SITE_OTHER): Payer: Self-pay | Admitting: Orthopaedic Surgery

## 2017-07-17 NOTE — Telephone Encounter (Signed)
Duplicate message. I called patient's daughter and answered the questions that she had.

## 2017-07-17 NOTE — Telephone Encounter (Signed)
Patients daughter, Renard Hamper called wanting to speak with you about her mothers MRI results. [phone]

## 2017-07-17 NOTE — Telephone Encounter (Signed)
Patient aware that Nurse will be coming out to evaluate medications

## 2017-07-17 NOTE — Telephone Encounter (Signed)
Called daughter back. Daughter says another daughter has some concern about pt taking her medicines appropriately. Went over symbicort vs albuterol use with daughter. Daughters line in [place]. Will request home health nurse eval for medication review.

## 2017-07-17 NOTE — Telephone Encounter (Signed)
I called and spoke with patient's daughter. She states that she was on the train when she discussed options with you and the phone was going in and out. She would like to know if you feel the arthroscopy is the best way for her mom to go vs the replacement at this point?  She would also know what the recovery process will be if she decides to do arthroscopy?  Patient has follow up appt in [place] on 07/25/17. Daughter is hopeful she will have a friend accompany her who will better understand for her mother.   Please advise.

## 2017-07-17 NOTE — Addendum Note (Signed)
Addended by: Caryl Bis on: 07/17/2017 03:47 PM   Modules accepted: Orders

## 2017-07-18 NOTE — Telephone Encounter (Signed)
I called. Pt wants to proceed with surgery. Discussed with her daughter who had called. Pt has appt in one week to discuss further , needs med clearance for Hep B and Hx cardiomyopathy with 3/11 echo that looked good and needs cardiology clearance , thanks

## 2017-07-19 NOTE — Telephone Encounter (Signed)
Pt daughter called and would like to speak with [facility] before 5:15 if possible

## 2017-07-24 ENCOUNTER — Encounter: Payer: Self-pay | Admitting: *Deleted

## 2017-07-25 ENCOUNTER — Telehealth: Payer: Self-pay | Admitting: Cardiology

## 2017-07-25 ENCOUNTER — Encounter (INDEPENDENT_AMBULATORY_CARE_PROVIDER_SITE_OTHER): Payer: Self-pay | Admitting: Orthopaedic Surgery

## 2017-07-25 ENCOUNTER — Ambulatory Visit (INDEPENDENT_AMBULATORY_CARE_PROVIDER_SITE_OTHER): Payer: Medicare Other | Admitting: Orthopaedic Surgery

## 2017-07-25 VITALS — BP 155/82 | HR 78 | Ht 64.0 in | Wt 154.0 lb

## 2017-07-25 DIAGNOSIS — M67911 Unspecified disorder of synovium and tendon, right shoulder: Secondary | ICD-10-CM

## 2017-07-25 DIAGNOSIS — M19011 Primary osteoarthritis, right shoulder: Secondary | ICD-10-CM | POA: Diagnosis not present

## 2017-07-25 DIAGNOSIS — M67813 Other specified disorders of tendon, right shoulder: Secondary | ICD-10-CM

## 2017-07-25 NOTE — Telephone Encounter (Signed)
Spoke with patient's daughter previously. Patient is being scheduled for surgery.  Dr. [name] has spoken with her as well.

## 2017-07-25 NOTE — Telephone Encounter (Signed)
   [facility] Pre-operative Risk Assessment    Request for surgical clearance:  1. What type of surgery is being performed? Right shoulder arthroscopy, biceps release   2. When is this surgery scheduled? TBD   3. What type of clearance is required (medical clearance vs. Pharmacy clearance to hold med vs. Both)? medical  4. Are there any medications that need to be held prior to surgery and how long? None specified   5. Practice name and name of physician performing surgery? Dr. Rodell Perna @ [place]   6. What is your office phone and fax number? (p) [phone]  (f) [phone]   7. Anesthesia type (None, local, MAC, general) ? Not specified    [name] 07/25/2017, 4:23 PM  _________________________________________________________________   (provider comments below)

## 2017-07-26 NOTE — Telephone Encounter (Signed)
Patient called and made aware that she needs an appointment before surgery. Appointment has been made for 08/01/17 with Corine Shelter, PA

## 2017-07-26 NOTE — Telephone Encounter (Signed)
I reviewed her chart, recent echo and BNP done for dyspnea. EF is OK but pro BNP was elevated. I think she should be seen before her shoulder surgery which is scheduled for April 8th.  Corine Shelter PA-C 07/26/2017 3:39 PM

## 2017-07-30 ENCOUNTER — Telehealth: Payer: Self-pay

## 2017-07-30 NOTE — Telephone Encounter (Signed)
Had an appt for shoulder and ortho was going to do a procedure but wouldn't because of heart  Wants to know what to do about shoulder pain?

## 2017-07-31 NOTE — Telephone Encounter (Signed)
Patient aware and verbalizes understanding. 

## 2017-07-31 NOTE — Telephone Encounter (Signed)
lmtcb

## 2017-07-31 NOTE — Telephone Encounter (Signed)
She has an appt tomorrow with cardiology, will have more information then. Hopefully will be able to get some relief through procedure with ortho. If further work up needed through cardiology procedure may need to be delayed.

## 2017-07-31 NOTE — Telephone Encounter (Signed)
Patient aware.

## 2017-08-01 ENCOUNTER — Ambulatory Visit (INDEPENDENT_AMBULATORY_CARE_PROVIDER_SITE_OTHER): Payer: Medicare Other | Admitting: Cardiology

## 2017-08-01 ENCOUNTER — Encounter: Payer: Self-pay | Admitting: Cardiology

## 2017-08-01 VITALS — BP 132/76 | HR 83 | Ht 64.0 in | Wt 153.2 lb

## 2017-08-01 DIAGNOSIS — I6523 Occlusion and stenosis of bilateral carotid arteries: Secondary | ICD-10-CM

## 2017-08-01 DIAGNOSIS — I5022 Chronic systolic (congestive) heart failure: Secondary | ICD-10-CM | POA: Diagnosis not present

## 2017-08-01 DIAGNOSIS — R0609 Other forms of dyspnea: Secondary | ICD-10-CM | POA: Diagnosis not present

## 2017-08-01 DIAGNOSIS — I10 Essential (primary) hypertension: Secondary | ICD-10-CM

## 2017-08-01 DIAGNOSIS — Z01810 Encounter for preprocedural cardiovascular examination: Secondary | ICD-10-CM

## 2017-08-01 DIAGNOSIS — I447 Left bundle-branch block, unspecified: Secondary | ICD-10-CM | POA: Diagnosis not present

## 2017-08-01 DIAGNOSIS — R0600 Dyspnea, unspecified: Secondary | ICD-10-CM

## 2017-08-01 NOTE — Assessment & Plan Note (Signed)
Pt needs Rt shoulder surgery (Dr [name]). Seen today to be cleared secondary to her recent OV with DOE and her abnormal BNP.  Symptomatically she is stable, will re check BNP

## 2017-08-01 NOTE — Patient Instructions (Addendum)
Medication Instructions: No medication changes    Labs: Your physician recommends that you get lab work today (BNP)   Follow-Up: Your physician recommends that you keep your follow up appointment on 08/07/17 with Dr. Antoine Poche   Any Other Special Instructions Will Be Listed Below (If Applicable).     If you need a refill on your cardiac medications before your next appointment, please call your pharmacy.

## 2017-08-01 NOTE — Assessment & Plan Note (Signed)
B/P controlled 

## 2017-08-01 NOTE — Assessment & Plan Note (Signed)
Mild, < 50% by doppler March 2019

## 2017-08-01 NOTE — Assessment & Plan Note (Signed)
COPD vs CHF- no significant CHF on exam but BNP was elevated

## 2017-08-01 NOTE — Progress Notes (Signed)
08/01/2017 [name]   1934-08-05  [id number]  Primary Physician [name], MD Primary Cardiologist: Dr Antoine Poche  HPI:  Pleasant 83 y/o AA female with a history of chronic diastolic CHF, HTN, and smoking. She was seen by Dr Antoine Poche 07/03/17 for complaints of DOE. Echo done 07/08/17 showed her EF to be 55% with mild LVH, grade 1 DD, PA pressure of 39 mmHg. Her BNP then was 1400. 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 She has chronic shoulder problems and now needs shoulder surgery. We were asked to clear her for this but I was hesitant to do that without seeing her secondary to her symptoms and her BNP and I asked her to come in for an OV.   She still has DOE but its unchanged. She denies any orthopnea or LE edema. She knows to watch her sodium intake.She denies any chest pain that sounds like angina.    Current Outpatient Medications  Medication Sig Dispense Refill  . albuterol (PROVENTIL HFA;VENTOLIN HFA) 108 (90 Base) MCG/ACT inhaler Inhale 2 puffs into the lungs every 6 (six) hours as needed for wheezing or shortness of breath. 1 Inhaler 2  . aspirin EC 81 MG tablet Take 81 mg by mouth daily.    Marland Kitchen buPROPion (WELLBUTRIN SR) 150 MG 12 hr tablet TAKE (1) TABLET TWICE A DAY. 60 tablet 2  . carvedilol (COREG) 6.25 MG tablet TAKE  (1)  TABLET TWICE A DAY WITH MEALS (BREAKFAST AND SUPPER) 60 tablet 4  . DYMISTA 137-50 MCG/ACT SUSP One spray per nostril twice a day 23 g 5  . escitalopram (LEXAPRO) 10 MG tablet Take 1 tablet (10 mg total) by mouth daily. 30 tablet 2  . esomeprazole (NEXIUM) 40 MG capsule Take 1 capsule (40 mg total) by mouth daily before breakfast. 90 capsule 4  . furosemide (LASIX) 20 MG tablet Take 1 tablet (20 mg total) by mouth daily. Pt to take 40 mg for 2 days then decrease to 20 mg daily 90 tablet 3  . loratadine (CLARITIN) 10 MG tablet Take 1 tablet (10 mg total) by mouth daily. 30 tablet 5  . losartan (COZAAR) 50 MG tablet Take  1/2 tablet (25 mg total) by mouth daily. 45 tablet 0  . Multiple Vitamins-Minerals (CENTRUM SILVER PO) Take 1 tablet by mouth daily.    . potassium chloride SA (K-DUR,KLOR-CON) 20 MEQ tablet Take 1 tablet (20 mEq total) by mouth daily. 90 tablet 3  . pravastatin (PRAVACHOL) 20 MG tablet Take 1 tablet (20 mg total) by mouth daily. 90 tablet 3  . raloxifene (EVISTA) 60 MG tablet TAKE 1 TABLET DAILY 90 tablet 1  . SYMBICORT 160-4.5 MCG/ACT inhaler Inhale 2 puffs into the lungs 2 (two) times daily. 10.2 g 0   No current facility-administered medications for this visit.     Allergies  Allergen Reactions  . Lisinopril-Hydrochlorothiazide Other (See Comments)    Lip swelling and cough. Stopped by ENT. Patient is on Losartan without any issues   . Aricept [Donepezil Hcl]     Nausea and vomiting  . Codeine Itching and Swelling    eye irritation (prescribed percocet)  . Namenda [Memantine Hcl]     Nausea and vomiting  . Penicillins Itching and Swelling    Has patient had a PCN reaction causing immediate rash, facial/tongue/throat swelling, SOB or lightheadedness with hypotension: Yes Has patient had a PCN reaction causing severe rash involving mucus membranes or skin  necrosis: No Has patient had a PCN reaction that required hospitalization No Has patient had a PCN reaction occurring within the last 10 years: No If all of the above answers are "NO", then may proceed with Cephalosporin use. eye irritation    Past Medical History:  Diagnosis Date  . Allergic rhinitis   . Anxiety   . Arthritis   . Cataract    had eye surgery  . Depression   . Elevated troponin 04/05/2016  . Essential hypertension, benign   . GERD (gastroesophageal reflux disease)   . History of blood transfusion   . Left bundle branch block   . Memory loss   . Mixed hyperlipidemia   . Occipital neuralgia of left side   . Osteopenia   . 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 Urinary  urgency     Social History   Socioeconomic History  . Marital status: Widowed    Spouse name: Not on file  . Number of children: 5  . Years of education: 8th grade  . Highest education level: Not on file  Occupational History  . Occupation: Worked in Geneticist, molecular for 30 years    Employer: RETIRED    Comment: Retired  . Occupation: Part time care giver  Social Needs  . Financial resource strain: Not on file  . Food insecurity:    Worry: Not on file    Inability: Not on file  . Transportation needs:    Medical: Not on file    Non-medical: Not on file  Tobacco Use  . Smoking status: Current Some Day Smoker    Packs/day: 0.10    Years: 60.00    Pack years: 6.00    Types: Cigarettes  . Smokeless tobacco: Never Used  . Tobacco comment: Smokes a cigarette maybe once a week at the most when she feel anxious  Substance and Sexual Activity  . Alcohol use: No    Alcohol/week: 0.0 oz  . Drug use: No  . Sexual activity: Never  Lifestyle  . Physical activity:    Days per week: Not on file    Minutes per session: Not on file  . Stress: Not on file  Relationships  . Social connections:    Talks on phone: Not on file    Gets together: Not on file    Attends religious service: Not on file    Active member of club or organization: Not on file    Attends meetings of clubs or organizations: Not on file    Relationship status: Not on file  . Intimate partner violence:    Fear of current or ex partner: Not on file    Emotionally abused: Not on file    Physically abused: Not on file    Forced sexual activity: Not on file  Other Topics Concern  . Not on file  Social History Narrative   Divorced, lives alone.   Right-handed.   1 cup coffee per day.     Family History  Problem Relation Age of Onset  . Diabetes Father   . Diabetes Brother   . Cancer Brother 70       colon  . Diabetes Brother   . Diabetes Brother   . Hypertension Sister   . Diabetes Sister   .  Diabetes Sister   . Diabetes Other   . Allergies Other   . Diabetes Son   . Colon cancer Neg Hx   . Anesthesia  problems Neg Hx   . Hypotension Neg Hx   . Malignant hyperthermia Neg Hx   . Pseudochol deficiency Neg Hx      Review of Systems: General: negative for chills, fever, night sweats or weight changes.  Cardiovascular: negative for chest pain, dyspnea on exertion, edema, orthopnea, palpitations, paroxysmal nocturnal dyspnea or shortness of breath Dermatological: negative for rash Respiratory: negative for cough or wheezing Urologic: negative for hematuria Abdominal: negative for nausea, vomiting, diarrhea, bright red blood per rectum, melena, or hematemesis Neurologic: negative for visual changes, syncope, or dizziness All other systems reviewed and are otherwise negative except as noted above.    Blood pressure 132/76, pulse 83, height 5\' 4"  (1.626 m), weight 153 lb 3.2 oz (69.5 kg), SpO2 95 %.  General appearance: alert, cooperative and no distress Neck: no carotid bruit and no JVD Lungs: clear to auscultation bilaterally Heart: regular rate and rhythm Extremities: no edema Skin: Skin color, texture, turgor normal. No rashes or lesions Neurologic: Grossly normal   ASSESSMENT AND PLAN:   Chronic diastolic heart failure (HCC) Echo march 2019- EF 55%-grade 1 DD Pt c/o DOE- no orthopnea, no edema. BNP was 1400 on 07/03/17  Pre-operative cardiovascular examination Pt needs Rt shoulder surgery (Dr 09/02/17). Seen today to be cleared secondary to her recent OV with DOE and her abnormal BNP.  Symptomatically she is stable, will re check BNP  Essential hypertension B/P controlled  Carotid artery disease (HCC) Mild, < 50% by doppler March 2019  Left bundle branch block .  DOE (dyspnea on exertion) COPD vs CHF- no significant CHF on exam but BNP was elevated   PLAN  I will re check her BNP and review with Dr April 2019 before granting cardiac clearance for her shoulder  surgery.   Antoine Poche PA-C 08/01/2017 12:54 PM

## 2017-08-01 NOTE — Assessment & Plan Note (Signed)
Echo march 2019- EF 55%-grade 1 DD Pt c/o DOE- no orthopnea, no edema. BNP was 1400 on 07/03/17

## 2017-08-02 LAB — BRAIN NATRIURETIC PEPTIDE: BNP: 135 pg/mL — ABNORMAL HIGH (ref 0.0–100.0)

## 2017-08-04 ENCOUNTER — Encounter (INDEPENDENT_AMBULATORY_CARE_PROVIDER_SITE_OTHER): Payer: Self-pay | Admitting: Orthopaedic Surgery

## 2017-08-04 DIAGNOSIS — M67813 Other specified disorders of tendon, right shoulder: Secondary | ICD-10-CM | POA: Insufficient documentation

## 2017-08-04 NOTE — Progress Notes (Signed)
Office Visit Note   Patient: [name]           Date of Birth: 1934-08-27           MRN: [id number] Visit Date: 07/25/2017              Requested by: [name], MD [address] [place], [place] [address] PCP: [name], MD   Assessment & Plan: Visit Diagnoses:  1. Primary osteoarthritis, right shoulder   2. Biceps tendinosis of right shoulder     Plan: Patient's had long-standing osteoarthritis of her shoulder recently sharp pain with catching and sharp pain anteriorly over the biceps tendon.  She should get good relief with outpatient arthroscopy removal of loose pieces are present in the shoulder with or catching symptoms and biceps tendon release which is likely her primary pain generator.  Her intra-articular biceps tendon has severe degenerative changes and she is not able to tolerate the pain symptoms of bothers her with daily activities, dressing and fixing her hair.  She has limitation of range of motion from osteoarthritis but is primarily having catching symptoms from her multiple small loose bodies and the partial tearing of the biceps tendon intra-articularly.  Discussed with the patient and also discussed with patient's daughter by telephone options of shoulder arthroscopy versus total shoulder arthroplasty.  Outpatient arthroscopy would be shorter procedure would not likely require any physical therapy and should give her fairly good pain relief.  She will need preoperative cardiac clearance.  Follow-Up Instructions: No follow-ups on file.   Orders:  No orders of the defined types were placed in this encounter.  No orders of the defined types were placed in this encounter.     Procedures: No procedures performed   Clinical Data: No additional findings.   Subjective: Chief Complaint  Patient presents with  . Right Shoulder - Pain, Follow-up    MRI review    HPI 83 year old female returns for follow-up post MRI of her right shoulder on  07/12/2017.  MRI scan shows moderate rotator cuff tendinosis, severe glenohumeral arthritis with multiple loose bodies and severe intra-articular biceps tendinosis.  She is had 2  intra-articular injections and used Tylenol.  Ongoing symptoms for more than 4 months.  Last injection was done 05/09/2017.  Pain with brushing her teeth and combing her hair getting dressed.  Pain wakes her up at night with movement of her shoulder.  She denies fever chills no numbness or tingling in her fingers.  Review of Systems review of systems updated and unchanged from 07/04/2017 office visit.  Of note is hypertension and cardiomyopathy.  Previous L4-5 microdiscectomy 2014 Dr. Venetia Maxon, right hip arthroplasty 2014 Dr. Turner Daniels.  Mild carotid stenosis less than 50% bilateral imaging 07/08/2017.  Cardiac echo ejection fraction 50-55% with mild LVH.   Objective: Vital Signs: BP (!) 155/82   Pulse 78   Ht 5\' 4"  (1.626 m)   Wt 154 lb (69.9 kg)   BMI 26.43 kg/m   Physical Exam  Constitutional: She is oriented to person, place, and time. She appears well-developed.  HENT:  Head: Normocephalic.  Right Ear: External ear normal.  Left Ear: External ear normal.  Eyes: Pupils are equal, round, and reactive to light.  Neck: No tracheal deviation present. No thyromegaly present.  Cardiovascular: Normal rate.  Pulmonary/Chest: Effort normal.  Abdominal: Soft.  Neurological: She is alert and oriented to person, place, and time.  Skin: Skin is warm and dry.  Psychiatric: She has  a normal mood and affect. Her behavior is normal.    Ortho Exam healed hip and lumbar incisions.  Pain with internal and external rotation of her shoulder she can only flex to 60 degrees actively.  Significant tenderness over the long head of the biceps tendon.  Negative Yergason test.  Pain with supraspinatus testing.  With flexion rotation she occasionally has locking with sharp pain.  Upper extremity reflexes are 2+ and symmetrical pulses at the wrist  are normal.  Specialty Comments:  No specialty comments available.  Imaging: CLINICAL DATA:  Chronic right shoulder pain and limited range of motion.  EXAM: MRI OF THE RIGHT SHOULDER WITHOUT CONTRAST  TECHNIQUE: Multiplanar, multisequence MR imaging of the shoulder was performed. No intravenous contrast was administered.  COMPARISON:  Right shoulder x-rays dated November 22, 2016.  FINDINGS: Rotator cuff: Moderate rotator cuff tendinosis, sparing the teres minor tendon. Low-grade partial-thickness articular surface tear of the distal superior subscapularis tendon fibers.  Muscles: No atrophy or abnormal signal of the muscles of the rotator cuff.  Biceps long head: Intact and normally positioned. Severe intra-articular tendinosis.  Acromioclavicular Joint: Prior distal clavicle resection and acromioplasty. Type I acromion. No significant subacromial/subdeltoid bursal fluid.  Glenohumeral Joint: Severe glenohumeral osteoarthritis with large areas of full-thickness cartilage loss over the glenoid and humeral head. Prominent subchondral cystic change and bony remodeling. Small joint effusion with synovitis. Multiple intra-articular loose bodies in the subscapularis recess and biceps tendon sheath.  Labrum:  Diffuse circumferential degenerative tearing of the labrum.  Bones:  No acute fracture or dislocation.  Other: None.  IMPRESSION: 1. Moderate rotator cuff tendinosis. Low-grade partial-thickness articular surface tear of the distal subscapularis tendon. 2. Severe intra-articular biceps tendinosis. 3. Severe glenohumeral osteoarthritis with multiple loose bodies.   Electronically Signed   By: Obie Dredge M.D.   On: 07/12/2017 10:47   PMFS History: Patient Active Problem List   Diagnosis Date Noted  . Biceps tendinosis of right shoulder 08/04/2017  . Pre-operative cardiovascular examination 08/01/2017  . Carotid artery disease (HCC) 07/03/2017    . DOE (dyspnea on exertion) 07/03/2017  . Chronic right shoulder pain 12/27/2016  . Constipation 04/11/2016  . Chronic diastolic heart failure (HCC) 04/07/2016  . Hyperglycemia 04/05/2016  . Syncope 11/15/2015  . Nausea and vomiting 11/15/2015  . Dehydration 10/05/2015  . UTI (lower urinary tract infection) 10/05/2015  . Mild dementia 09/13/2015  . Memory problem 05/07/2015  . Neck pain 04/06/2015  . Insomnia 12/10/2014  . BMI 28.0-28.9,adult 12/10/2014  . Osteopenia 05/19/2014  . GAD (generalized anxiety disorder) 01/06/2014  . Depression 01/06/2014  . GERD (gastroesophageal reflux disease) 01/06/2014  . Vitamin D deficiency 01/06/2014  . Overactive bladder 01/06/2014  . Secondary cardiomyopathy (HCC) 10/06/2013  . Left bundle branch block 10/06/2013  . Dizziness 10/04/2013  . Osteoarthritis of right hip 03/14/2013  . Cough 02/01/2011  . Reflux 02/01/2011  . Allergic rhinitis 12/07/2008  . Chronic cough 12/07/2008  . Hepatitis B virus infection 08/16/2008  . Hyperlipidemia 08/16/2008  . Essential hypertension 08/16/2008  . Primary osteoarthritis, right shoulder 08/16/2008  . DISC DEGENERATION 07/24/2007   Past Medical History:  Diagnosis Date  . Allergic rhinitis   . Anxiety   . Arthritis   . Cataract    had eye surgery  . Depression   . Elevated troponin 04/05/2016  . Essential hypertension, benign   . GERD (gastroesophageal reflux disease)   . History of blood transfusion   . Left bundle branch block   .  Memory loss   . Mixed hyperlipidemia   . Occipital neuralgia of left side   . Osteopenia   . 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 Urinary urgency     Family History  Problem Relation Age of Onset  . Diabetes Father   . Diabetes Brother   . Cancer Brother 54       colon  . Diabetes Brother   . Diabetes Brother   . Hypertension Sister   . Diabetes Sister   . Diabetes Sister   . Diabetes Other   . Allergies Other   .  Diabetes Son   . Colon cancer Neg Hx   . Anesthesia problems Neg Hx   . Hypotension Neg Hx   . Malignant hyperthermia Neg Hx   . Pseudochol deficiency Neg Hx     Past Surgical History:  Procedure Laterality Date  . APPENDECTOMY    . BACK SURGERY    . CARDIAC CATHETERIZATION  2010   Normal coronaries  . CATARACT EXTRACTION W/PHACO  04/16/2011   Procedure: CATARACT EXTRACTION PHACO AND INTRAOCULAR LENS PLACEMENT (IOC);  Surgeon: Susa Simmonds;  Location: [facility];  Service: Ophthalmology;  Laterality: Left;  CDE=11.35  . CATARACT EXTRACTION W/PHACO  01/28/2012   Procedure: CATARACT EXTRACTION PHACO AND INTRAOCULAR LENS PLACEMENT (IOC);  Surgeon: Susa Simmonds, MD;  Location: [facility];  Service: Ophthalmology;  Laterality: Right;  CDI:8.15  . CYSTO WITH HYDRODISTENSION  05/29/2012   Procedure: CYSTOSCOPY/HYDRODISTENSION;  Surgeon: Martina Sinner, MD;  Location[facility];  Service: Urology;  Laterality: N/A;  INSTILLATION OF MARCAINE AND PYRIDIUM   . DILATION AND CURETTAGE OF UTERUS  ?<hysterectomy"  . HAMMER TOE SURGERY Bilateral 11/22/11   [facility], [name]  . JOINT REPLACEMENT    . LAPAROSCOPIC CHOLECYSTECTOMY  1991  . LUMBAR LAMINECTOMY  2011  . LUMBAR LAMINECTOMY/DECOMPRESSION MICRODISCECTOMY Left 06/06/2012   Procedure: LUMBAR LAMINECTOMY/DECOMPRESSION MICRODISCECTOMY 1 LEVEL;  Surgeon: Maeola Harman, MD;  Location: [facility];  Service: Neurosurgery;  Laterality: Left;  Left Lumbar two-three Laminectomy for resection of synovial cyst  . SHOULDER OPEN ROTATOR CUFF REPAIR Right 2011  . TONSILLECTOMY    . TOTAL ABDOMINAL HYSTERECTOMY    . TOTAL HIP ARTHROPLASTY Right 03/13/2013   Procedure: TOTAL HIP ARTHROPLASTY;  Surgeon: Nestor Lewandowsky, MD;  Location: [facility];  Service: Orthopedics;  Laterality: Right;   Social History   Occupational History  . Occupation: Worked in Geneticist, molecular for 30 years    Employer: RETIRED    Comment: Retired  . Occupation: Part  time care giver  Tobacco Use  . Smoking status: Current Some Day Smoker    Packs/day: 0.10    Years: 60.00    Pack years: 6.00    Types: Cigarettes  . Smokeless tobacco: Never Used  . Tobacco comment: Smokes a cigarette maybe once a week at the most when she feel anxious  Substance and Sexual Activity  . Alcohol use: No    Alcohol/week: 0.0 oz  . Drug use: No  . Sexual activity: Never

## 2017-08-05 ENCOUNTER — Other Ambulatory Visit: Payer: Self-pay | Admitting: Cardiology

## 2017-08-05 NOTE — Progress Notes (Signed)
Cardiology Office Note   Date:  08/07/2017   ID:  [name], DOB Aug 01, 1934, MRN [id number]  PCP:  [name] Sheriff, MD  Cardiologist:   No primary care provider on file. Referring:  [name] Sheriff, MD   Chief Complaint  Patient presents with  . Pre-op Exam      History of Present Illness: [name] is a 83 y.o. female who presents for follow up of diastolic CHF.  Echo 07/08/17 demonstrated preserved EF.  BNP was elevated at 1400.  She was seen earlier this month for preop clearance prior to shoulder surgery.  She had a repeat BNP which was down to 135.  Since she was last seen she says she does well.  She does get short of breath walking a moderate distance on level ground but this has been chronic.  She says she tries to walk a mile a couple of times per week.  With this she denies any new cardiovascular symptoms. The patient denies any new symptoms such as chest discomfort, neck or arm discomfort. There has been no new shortness of breath, PND or orthopnea. There have been no reported palpitations, presyncope or syncope.   She still takes care of her son who is a bilateral amputee.  Past Medical History:  Diagnosis Date  . Allergic rhinitis   . Anxiety   . Arthritis   . Cataract    had eye surgery  . Depression   . Elevated troponin 04/05/2016  . Essential hypertension, benign   . GERD (gastroesophageal reflux disease)   . History of blood transfusion   . Left bundle branch block   . Memory loss   . Mixed hyperlipidemia   . Occipital neuralgia of left side   . Osteopenia   . 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 Urinary urgency     Past Surgical History:  Procedure Laterality Date  . APPENDECTOMY    . BACK SURGERY    . CARDIAC CATHETERIZATION  2010   Normal coronaries  . CATARACT EXTRACTION W/PHACO  04/16/2011   Procedure: CATARACT EXTRACTION PHACO AND INTRAOCULAR LENS PLACEMENT (IOC);  Surgeon: Susa Simmonds;   Location: [facility];  Service: Ophthalmology;  Laterality: Left;  CDE=11.35  . CATARACT EXTRACTION W/PHACO  01/28/2012   Procedure: CATARACT EXTRACTION PHACO AND INTRAOCULAR LENS PLACEMENT (IOC);  Surgeon: Susa Simmonds, MD;  Location: [facility];  Service: Ophthalmology;  Laterality: Right;  CDI:8.15  . CYSTO WITH HYDRODISTENSION  05/29/2012   Procedure: CYSTOSCOPY/HYDRODISTENSION;  Surgeon: Martina Sinner, MD;  [facility];  Service: Urology;  Laterality: N/A;  INSTILLATION OF MARCAINE AND PYRIDIUM   . DILATION AND CURETTAGE OF UTERUS  ?<hysterectomy"  . HAMMER TOE SURGERY Bilateral 11/22/11   [facility], [name]  . JOINT REPLACEMENT    . LAPAROSCOPIC CHOLECYSTECTOMY  1991  . LUMBAR LAMINECTOMY  2011  . LUMBAR LAMINECTOMY/DECOMPRESSION MICRODISCECTOMY Left 06/06/2012   Procedure: LUMBAR LAMINECTOMY/DECOMPRESSION MICRODISCECTOMY 1 LEVEL;  Surgeon: Maeola Harman, MD;  Location: [facility];  Service: Neurosurgery;  Laterality: Left;  Left Lumbar two-three Laminectomy for resection of synovial cyst  . SHOULDER OPEN ROTATOR CUFF REPAIR Right 2011  . TONSILLECTOMY    . TOTAL ABDOMINAL HYSTERECTOMY    . TOTAL HIP ARTHROPLASTY Right 03/13/2013   Procedure: TOTAL HIP ARTHROPLASTY;  Surgeon: Nestor Lewandowsky, MD;  Location: [facility];  Service: Orthopedics;  Laterality: Right;     Current Outpatient Medications  Medication Sig Dispense Refill  . albuterol (PROVENTIL HFA;VENTOLIN HFA) 108 (90 Base) MCG/ACT inhaler Inhale 2 puffs into the lungs every 6 (six) hours as needed for wheezing or shortness of breath. 1 Inhaler 2  . aspirin EC 81 MG tablet Take 81 mg by mouth daily.    Marland Kitchen buPROPion (WELLBUTRIN SR) 150 MG 12 hr tablet Take 150 mg by mouth 2 (two) times daily.    . carvedilol (COREG) 6.25 MG tablet Take 6.25 mg by mouth 2 (two) times daily with a meal.    . DYMISTA 137-50 MCG/ACT SUSP One spray per nostril twice a day 23 g 5  . escitalopram (LEXAPRO) 10 MG tablet Take 1 tablet (10 mg  total) by mouth daily. 30 tablet 2  . esomeprazole (NEXIUM) 40 MG capsule Take 1 capsule (40 mg total) by mouth daily before breakfast. 90 capsule 4  . furosemide (LASIX) 20 MG tablet Take 1 tablet (20 mg total) by mouth daily. Pt to take 40 mg for 2 days then decrease to 20 mg daily 90 tablet 3  . loratadine (CLARITIN) 10 MG tablet Take 1 tablet (10 mg total) by mouth daily. 30 tablet 5  . losartan (COZAAR) 50 MG tablet Take 1/2 tablet (25 mg total) by mouth daily. 45 tablet 0  . Multiple Vitamins-Minerals (CENTRUM SILVER PO) Take 1 tablet by mouth daily.    . potassium chloride SA (K-DUR,KLOR-CON) 20 MEQ tablet Take 1 tablet (20 mEq total) by mouth daily. 90 tablet 3  . pravastatin (PRAVACHOL) 80 MG tablet Take 1 tablet (80 mg total) by mouth daily. 90 tablet 3  . raloxifene (EVISTA) 60 MG tablet Take 60 mg by mouth daily.    . SYMBICORT 160-4.5 MCG/ACT inhaler Inhale 2 puffs into the lungs 2 (two) times daily. 10.2 g 0   No current facility-administered medications for this visit.     Allergies:   Lisinopril-hydrochlorothiazide; Aricept [donepezil hcl]; Codeine; Namenda [memantine hcl]; and Penicillins    ROS:  Please see the history of present illness.   Otherwise, review of systems are positive for shoulder pain.   All other systems are reviewed and negative.    PHYSICAL EXAM: VS:  BP 124/70   Pulse 80   Ht 5\' 2"  (1.575 m)   Wt 150 lb (68 kg)   BMI 27.44 kg/m  , BMI Body mass index is 27.44 kg/m.  GENERAL:  Well appearing NECK:  No jugular venous distention, waveform within normal limits, carotid upstroke brisk and symmetric, no bruits, no thyromegaly LUNGS:  Clear to auscultation bilaterally CHEST:  Unremarkable HEART:  PMI not displaced or sustained,S1 and S2 within normal limits, no S3, no S4, no clicks, no rubs, no murmurs ABD:  Flat, positive bowel sounds normal in frequency in pitch, no bruits, no rebound, no guarding, no midline pulsatile mass, no hepatomegaly, no  splenomegaly EXT:  2 plus pulses throughout, no edema, no cyanosis no clubbing     EKG:  EKG is note ordered today.   Recent Labs: 06/12/2017: ALT 16; BUN 10; Creatinine, Ser 1.06; Hemoglobin 13.8; Platelets 308; Potassium 3.4; Sodium 146; TSH 0.837 07/03/2017: NT-Pro BNP 1,415 08/01/2017: BNP 135.0    Lipid Panel    Component Value Date/Time   CHOL 226 (H) 06/12/2017 1503   CHOL 149 10/27/2012 1028   TRIG 93 12/10/2014 1553   TRIG 61 01/26/2013 0927   TRIG 72 10/27/2012 1028   HDL 88 06/12/2017 1503   HDL 76 01/26/2013 0927   HDL 59  10/27/2012 1028   CHOLHDL 2.5 12/10/2014 1553   LDLCALC 100 (H) 12/10/2014 1553   LDLCALC 98 01/26/2013 0927   LDLCALC 76 10/27/2012 1028   LDLDIRECT 135 (H) 06/12/2017 1503      Wt Readings from Last 3 Encounters:  08/07/17 150 lb (68 kg)  08/01/17 153 lb 3.2 oz (69.5 kg)  07/25/17 154 lb (69.9 kg)      Other studies Reviewed: Additional studies/ records that were reviewed today include: Labs Review of the above records demonstrates:      ASSESSMENT AND PLAN:  PREOP: The patient has a high functional status.  She is not going for high risk procedure.  She has no acute symptoms. Therefore, based on ACC/AHA guidelines, the patient would be at acceptable risk for the planned procedure without further cardiovascular testing.  ACUTE ON CHRONIC SYSTOLIC AND DIASTOLIC HF:    She seems to be euvolemic.  No change in therapy.    HTN:   The blood pressure is at target. No change in medications is indicated. We will continue with therapeutic lifestyle changes (TLC).  CAROTID DOPPLER:   She had less than 50% stenosis last month.  I will follow this in two years.    mild plaque in 2015.  I will repeat this study.    TOBACCO ABUSE:    She says that she will quit smoking.   DYSLIPIDEMIA:  Her LDL last month was 135.  I will increase the Pravachol to 80 mg daily.   Current medicines are reviewed at length with the patient today.  The patient does  not have concerns regarding medicines.  The following changes have been made:   As above.  Labs/ tests ordered today include:   No orders of the defined types were placed in this encounter.    Disposition:   FU with me in one year.   Signed, Rollene Rotunda, MD  08/07/2017 2:29 PM    [facility]

## 2017-08-07 ENCOUNTER — Other Ambulatory Visit: Payer: Self-pay

## 2017-08-07 ENCOUNTER — Ambulatory Visit: Payer: Medicare Other | Admitting: Cardiology

## 2017-08-07 ENCOUNTER — Encounter: Payer: Self-pay | Admitting: Cardiology

## 2017-08-07 VITALS — BP 124/70 | HR 80 | Ht 62.0 in | Wt 150.0 lb

## 2017-08-07 DIAGNOSIS — Z720 Tobacco use: Secondary | ICD-10-CM | POA: Diagnosis not present

## 2017-08-07 DIAGNOSIS — I10 Essential (primary) hypertension: Secondary | ICD-10-CM

## 2017-08-07 DIAGNOSIS — Z01810 Encounter for preprocedural cardiovascular examination: Secondary | ICD-10-CM | POA: Diagnosis not present

## 2017-08-07 DIAGNOSIS — I6523 Occlusion and stenosis of bilateral carotid arteries: Secondary | ICD-10-CM | POA: Diagnosis not present

## 2017-08-07 DIAGNOSIS — I5033 Acute on chronic diastolic (congestive) heart failure: Secondary | ICD-10-CM

## 2017-08-07 MED ORDER — PRAVASTATIN SODIUM 80 MG PO TABS: 80.0000 mg | ORAL_TABLET | Freq: Every day | ORAL | 3 refills | Status: DC

## 2017-08-07 NOTE — Patient Instructions (Signed)
Medication Instructions:  Please increase your Pravastatin to 80 mg a day. Continue all other medications as listed.  Follow-Up: Follow up in 1 year with Dr. Antoine Poche.  You will receive a letter in the mail 2 months before you are due.  Please call us when you receive this letter to schedule your follow up appointment.  If you need a refill on your cardiac medications before your next appointment, please call your pharmacy.  Thank you for choosing [facility]!!

## 2017-08-08 ENCOUNTER — Encounter: Payer: Self-pay | Admitting: Cardiology

## 2017-08-10 ENCOUNTER — Ambulatory Visit (INDEPENDENT_AMBULATORY_CARE_PROVIDER_SITE_OTHER): Payer: Medicare Other | Admitting: Family Medicine

## 2017-08-10 VITALS — BP 155/81 | Temp 99.0°F | Ht 62.0 in | Wt 151.0 lb

## 2017-08-10 DIAGNOSIS — J441 Chronic obstructive pulmonary disease with (acute) exacerbation: Secondary | ICD-10-CM | POA: Diagnosis not present

## 2017-08-10 MED ORDER — AZITHROMYCIN 250 MG PO TABS: ORAL_TABLET | ORAL | 0 refills | Status: DC

## 2017-08-10 MED ORDER — PREDNISONE 20 MG PO TABS: ORAL_TABLET | ORAL | 0 refills | Status: DC

## 2017-08-10 MED ORDER — BENZONATATE 100 MG PO CAPS: 100.0000 mg | ORAL_CAPSULE | Freq: Two times a day (BID) | ORAL | 0 refills | Status: DC | PRN

## 2017-08-10 NOTE — Progress Notes (Signed)
BP (!) 155/81 (BP Location: Left Arm, Patient Position: Sitting, Cuff Size: Normal)   Temp 99 F (37.2 C) (Oral)   Ht 5\' 2"  (1.575 m)   Wt 151 lb (68.5 kg)   BMI 27.62 kg/m    Subjective:    Patient ID: , female    DOB: 1935/02/11, 83 y.o.   MRN: [id number]  HPI: [name] is a 83 y.o. female presenting on 08/10/2017 for URI (cough, runny nose, post nasal drainage and nasal congestion x 2 days. Took nyquil last night. Cough is worse at night and keeps her.)   HPI Cough and congestion and nasal drainage Patient comes in complaining of cough and congestion and nasal drainage and postnasal drainage that is been going on for 2 days.  She says her cough is getting worse and keeping her up at night.  She says is mostly dry but did have a little phlegm last night.  She denies any fevers or chills.  She did take NyQuil last night and it did help some.  She has had a little bit of wheezing but no shortness of breath.  She has been using her Symbicort and albuterol.  She is still smoking but has reduced her cigarette smoking substantially from where she was at previously in her life.  Relevant past medical, surgical, family and social history reviewed and updated as indicated. Interim medical history since our last visit reviewed. Allergies and medications reviewed and updated.  Review of Systems  Constitutional: Negative for chills and fever.  HENT: Positive for congestion, postnasal drip, rhinorrhea, sinus pressure, sneezing and sore throat. Negative for ear discharge and ear pain.   Eyes: Negative for pain, redness and visual disturbance.  Respiratory: Positive for cough and wheezing. Negative for chest tightness and shortness of breath.   Cardiovascular: Negative for chest pain and leg swelling.  Genitourinary: Negative for difficulty urinating and dysuria.  Musculoskeletal: Negative for back pain and gait problem.  Skin: Negative for rash.  Neurological: Negative for  light-headedness and headaches.  Psychiatric/Behavioral: Negative for agitation and behavioral problems.  All other systems reviewed and are negative.   Per HPI unless specifically indicated above   Allergies as of 08/10/2017      Reactions   Lisinopril-hydrochlorothiazide Other (See Comments)   Lip swelling and cough. Stopped by ENT. Patient is on Losartan without any issues    Aricept [donepezil Hcl]    Nausea and vomiting   Codeine Itching, Swelling   eye irritation (prescribed percocet)   Namenda [memantine Hcl]    Nausea and vomiting   Penicillins Itching, Swelling   Has patient had a PCN reaction causing immediate rash, facial/tongue/throat swelling, SOB or lightheadedness with hypotension: Yes Has patient had a PCN reaction causing severe rash involving mucus membranes or skin necrosis: No Has patient had a PCN reaction that required hospitalization No Has patient had a PCN reaction occurring within the last 10 years: No If all of the above answers are "NO", then may proceed with Cephalosporin use. eye irritation      Medication List        Accurate as of 08/10/17 11:05 AM. Always use your most recent med list.          albuterol 108 (90 Base) MCG/ACT inhaler Commonly known as:  PROVENTIL HFA;VENTOLIN HFA Inhale 2 puffs into the lungs every 6 (six) hours as needed for wheezing or shortness of breath.   aspirin EC 81 MG tablet Take 81 mg by  mouth daily.   azithromycin 250 MG tablet Commonly known as:  ZITHROMAX Take 2 the first day and then one each day after.   benzonatate 100 MG capsule Commonly known as:  TESSALON PERLES Take 1 capsule (100 mg total) by mouth 2 (two) times daily as needed for cough.   buPROPion 150 MG 12 hr tablet Commonly known as:  WELLBUTRIN SR Take 150 mg by mouth 2 (two) times daily.   carvedilol 6.25 MG tablet Commonly known as:  COREG Take 6.25 mg by mouth 2 (two) times daily with a meal.   CENTRUM SILVER PO Take 1 tablet by  mouth daily.   DYMISTA 137-50 MCG/ACT Susp Generic drug:  Azelastine-Fluticasone One spray per nostril twice a day   escitalopram 10 MG tablet Commonly known as:  LEXAPRO Take 1 tablet (10 mg total) by mouth daily.   esomeprazole 40 MG capsule Commonly known as:  NEXIUM Take 1 capsule (40 mg total) by mouth daily before breakfast.   furosemide 20 MG tablet Commonly known as:  LASIX Take 1 tablet (20 mg total) by mouth daily. Pt to take 40 mg for 2 days then decrease to 20 mg daily   loratadine 10 MG tablet Commonly known as:  CLARITIN Take 1 tablet (10 mg total) by mouth daily.   losartan 50 MG tablet Commonly known as:  COZAAR Take 1/2 tablet (25 mg total) by mouth daily.   potassium chloride SA 20 MEQ tablet Commonly known as:  K-DUR,KLOR-CON Take 1 tablet (20 mEq total) by mouth daily.   pravastatin 80 MG tablet Commonly known as:  PRAVACHOL Take 1 tablet (80 mg total) by mouth daily.   predniSONE 20 MG tablet Commonly known as:  DELTASONE 2 po at same time daily for 5 days   raloxifene 60 MG tablet Commonly known as:  EVISTA Take 60 mg by mouth daily.   SYMBICORT 160-4.5 MCG/ACT inhaler Generic drug:  budesonide-formoterol Inhale 2 puffs into the lungs 2 (two) times daily.          Objective:    BP (!) 155/81 (BP Location: Left Arm, Patient Position: Sitting, Cuff Size: Normal)   Temp 99 F (37.2 C) (Oral)   Ht 5\' 2"  (1.575 m)   Wt 151 lb (68.5 kg)   BMI 27.62 kg/m   Wt Readings from Last 3 Encounters:  08/10/17 151 lb (68.5 kg)  08/07/17 150 lb (68 kg)  08/01/17 153 lb 3.2 oz (69.5 kg)    Physical Exam  Constitutional: She is oriented to person, place, and time. She appears well-developed and well-nourished. No distress.  HENT:  Right Ear: Tympanic membrane, external ear and ear canal normal.  Left Ear: Tympanic membrane, external ear and ear canal normal.  Nose: Mucosal edema and rhinorrhea present. No epistaxis. Right sinus exhibits no  maxillary sinus tenderness and no frontal sinus tenderness. Left sinus exhibits no maxillary sinus tenderness and no frontal sinus tenderness.  Mouth/Throat: Uvula is midline and mucous membranes are normal. Posterior oropharyngeal edema present. No oropharyngeal exudate, posterior oropharyngeal erythema or tonsillar abscesses.  Eyes: Conjunctivae and EOM are normal.  Cardiovascular: Normal rate, regular rhythm, normal heart sounds and intact distal pulses.  No murmur heard. Pulmonary/Chest: Effort normal and breath sounds normal. No respiratory distress. She has no wheezes. She has no rales.  Musculoskeletal: Normal range of motion. She exhibits no edema or tenderness.  Neurological: She is alert and oriented to person, place, and time. Coordination normal.  Skin: Skin is warm and  dry. No rash noted. She is not diaphoretic.  Psychiatric: She has a normal mood and affect. Her behavior is normal.  Vitals reviewed.       Assessment & Plan:   Problem List Items Addressed This Visit    None    Visit Diagnoses    COPD exacerbation (HCC)    -  Primary   Recommended allergy pill and will send Z-Pak and prednisone   Relevant Medications   azithromycin (ZITHROMAX) 250 MG tablet   predniSONE (DELTASONE) 20 MG tablet   benzonatate (TESSALON PERLES) 100 MG capsule       Follow up plan: Return if symptoms worsen or fail to improve.  Counseling provided for all of the vaccine components No orders of the defined types were placed in this encounter.   Arville Care, MD [facility] 08/10/2017, 11:05 AM

## 2017-08-15 ENCOUNTER — Ambulatory Visit: Payer: Medicare Other | Admitting: Pediatrics

## 2017-08-15 ENCOUNTER — Telehealth (HOSPITAL_COMMUNITY): Payer: Self-pay

## 2017-08-15 NOTE — Telephone Encounter (Signed)
Encounter complete. 

## 2017-08-20 ENCOUNTER — Ambulatory Visit (HOSPITAL_COMMUNITY)
Admission: RE | Admit: 2017-08-20 | Discharge: 2017-08-20 | Disposition: A | Discharge status: Home / Self Care | Payer: Medicare Other | Source: Ambulatory Visit | Attending: Cardiology | Admitting: Cardiology

## 2017-08-20 DIAGNOSIS — R0600 Dyspnea, unspecified: Secondary | ICD-10-CM | POA: Insufficient documentation

## 2017-08-20 DIAGNOSIS — I447 Left bundle-branch block, unspecified: Secondary | ICD-10-CM | POA: Diagnosis not present

## 2017-08-20 DIAGNOSIS — R9439 Abnormal result of other cardiovascular function study: Secondary | ICD-10-CM | POA: Diagnosis not present

## 2017-08-20 DIAGNOSIS — Z01810 Encounter for preprocedural cardiovascular examination: Secondary | ICD-10-CM | POA: Insufficient documentation

## 2017-08-20 LAB — MYOCARDIAL PERFUSION IMAGING
LV dias vol: 115 mL (ref 46–106)
LV sys vol: 71 mL
Peak HR: 83 {beats}/min
Rest HR: 70 {beats}/min
SDS: 1
SRS: 8
SSS: 9
TID: 1.12

## 2017-08-20 MED ORDER — TECHNETIUM TC 99M TETROFOSMIN IV KIT
30.3000 | PACK | Freq: Once | INTRAVENOUS | Status: AC | PRN
  Administered 2017-08-20: 30.3 via INTRAVENOUS
  Filled 2017-08-20: qty 31

## 2017-08-20 MED ORDER — TECHNETIUM TC 99M TETROFOSMIN IV KIT
10.9000 | PACK | Freq: Once | INTRAVENOUS | Status: AC | PRN
  Administered 2017-08-20: 10.9 via INTRAVENOUS
  Filled 2017-08-20: qty 11

## 2017-08-20 MED ORDER — REGADENOSON 0.4 MG/5ML IV SOLN
0.4000 mg | Freq: Once | INTRAVENOUS | Status: AC
  Administered 2017-08-20: 0.4 mg via INTRAVENOUS

## 2017-08-21 ENCOUNTER — Ambulatory Visit (INDEPENDENT_AMBULATORY_CARE_PROVIDER_SITE_OTHER): Payer: Medicare Other | Admitting: Family Medicine

## 2017-08-21 ENCOUNTER — Encounter: Payer: Self-pay | Admitting: Family Medicine

## 2017-08-21 VITALS — BP 152/80 | HR 82 | Temp 97.3°F | Ht 62.0 in | Wt 154.0 lb

## 2017-08-21 DIAGNOSIS — J40 Bronchitis, not specified as acute or chronic: Secondary | ICD-10-CM

## 2017-08-21 DIAGNOSIS — J329 Chronic sinusitis, unspecified: Secondary | ICD-10-CM | POA: Diagnosis not present

## 2017-08-21 MED ORDER — LEVOFLOXACIN 500 MG PO TABS: 500.0000 mg | ORAL_TABLET | Freq: Every day | ORAL | 0 refills | Status: DC

## 2017-08-21 NOTE — Progress Notes (Signed)
Chief Complaint  Patient presents with  . Cough    Seen 4/13 and given Zpack  . Nasal Congestion    HPI  Patient presents today for Patient presents with upper respiratory congestion. Rhinorrhea that is occasionally purulent. There is no sore throat. Patient reports coughing frequently all night long as well.  Green sputum noted.  She is having chills.  She also has been feeling increasing dyspnea.. Onset was approximately 2 weeks ago.  She was seen 9 days ago treated with a Zithromax Z-Pak and experienced no relief.  In fact she has been gradually worsening.  PMH: Smoking status noted ROS: Per HPI  Objective: BP (!) 152/80   Pulse 82   Temp (!) 97.3 F (36.3 C) (Oral)   Ht 5\' 2"  (1.575 m)   Wt 154 lb (69.9 kg)   BMI 28.17 kg/m  Gen: NAD, alert, cooperative with exam HEENT: NCAT, Nasal passages swollen, red TMS RED CV: RRR, good S1/S2, no murmur Resp: Bronchitis changes with scattered wheezes, non-labored Ext: No edema, warm Neuro: Alert and oriented, No gross deficits  Assessment and plan:  1. Sinobronchitis     Meds ordered this encounter  Medications  . levofloxacin (LEVAQUIN) 500 MG tablet    Sig: Take 1 tablet (500 mg total) by mouth daily.    Dispense:  10 tablet    Refill:  0  . betamethasone acetate-betamethasone sodium phosphate (CELESTONE) injection 6 mg    No orders of the defined types were placed in this encounter.   Follow up as needed.  , MD

## 2017-08-22 MED ORDER — BETAMETHASONE SOD PHOS & ACET 6 (3-3) MG/ML IJ SUSP
6.0000 mg | Freq: Once | INTRAMUSCULAR | Status: AC
  Administered 2017-08-21: 6 mg via INTRAMUSCULAR

## 2017-08-23 ENCOUNTER — Encounter: Payer: Self-pay | Admitting: Pediatrics

## 2017-08-23 ENCOUNTER — Ambulatory Visit (INDEPENDENT_AMBULATORY_CARE_PROVIDER_SITE_OTHER): Payer: Medicare Other

## 2017-08-23 ENCOUNTER — Ambulatory Visit (INDEPENDENT_AMBULATORY_CARE_PROVIDER_SITE_OTHER): Payer: Medicare Other | Admitting: Pediatrics

## 2017-08-23 VITALS — BP 138/82 | HR 81 | Temp 97.5°F | Resp 18 | Ht 62.0 in | Wt 153.4 lb

## 2017-08-23 DIAGNOSIS — R059 Cough, unspecified: Secondary | ICD-10-CM

## 2017-08-23 DIAGNOSIS — R0989 Other specified symptoms and signs involving the circulatory and respiratory systems: Secondary | ICD-10-CM

## 2017-08-23 DIAGNOSIS — R05 Cough: Secondary | ICD-10-CM

## 2017-08-23 NOTE — Progress Notes (Signed)
  Subjective:   Patient ID: [name], female    DOB: 1935-04-16, 83 y.o.   MRN: [id number] CC: Cough  HPI: [name] is a 83 y.o. female presenting for Cough  Seen 4/13 for cough, congestion, wheezing at home. Treated with steroids, azithromycin. Seen two days ago, switched to levofloxacin.  Here today for follow up. Still with cough. Does feel better compared to how she felt when first ill. No SOB. Appetite has been down past two weeks since she has been sick. Tolerating levofloxacin. No fevers. Cough is dry. Using symbicort twice a day. Taking albuterol more often since she has been sick, up to 1-2 times a day.  Relevant past medical, surgical, family and social history reviewed. Allergies and medications reviewed and updated. Social History   Tobacco Use  Smoking Status Current Some Day Smoker  . Packs/day: 0.10  . Years: 60.00  . Pack years: 6.00  . Types: Cigarettes  Smokeless Tobacco Never Used  Tobacco Comment   Smokes a cigarette maybe once a week at the most when she feel anxious   ROS: Per HPI   Objective:    BP 138/82   Pulse 81   Temp (!) 97.5 F (36.4 C) (Oral)   Resp 18   Ht 5\' 2"  (1.575 m)   Wt 153 lb 6.4 oz (69.6 kg)   SpO2 97%   BMI 28.06 kg/m   Wt Readings from Last 3 Encounters:  08/23/17 153 lb 6.4 oz (69.6 kg)  08/21/17 154 lb (69.9 kg)  08/20/17 150 lb (68 kg)    Gen: NAD, alert, cooperative with exam, NCAT EYES: EOMI, no conjunctival injection, or no icterus ENT:  TMs pearly gray b/l, OP without erythema LYMPH: no cervical LAD CV: NRRR, normal S1/S2 Resp: CTABL, no wheezes, normal WOB Ext: No edema, warm Neuro: Alert and oriented MSK: normal muscle bulk  Assessment & Plan:  [name] was seen today for cough.  Diagnoses and all orders for this visit:  Cough Improving. Normal lung exam. Normal CXR. Pt with h/o systolic CHF, euvolemic on exam today. Keep water/lozenges to help with cough. OK to take albuterol every 6 hours as  it does seem to help with cough. Cont symbicort. -     DG Chest 2 View; Future  Chest congestion -     DG Chest 2 View; Future   Follow up plan: 3 mo [name] Lias, MD [name] [facility]

## 2017-08-24 ENCOUNTER — Encounter: Payer: Self-pay | Admitting: Pediatrics

## 2017-08-26 ENCOUNTER — Encounter: Payer: Self-pay | Admitting: Family Medicine

## 2017-08-26 ENCOUNTER — Telehealth: Payer: Self-pay | Admitting: Pediatrics

## 2017-08-26 NOTE — Telephone Encounter (Signed)
Daughter aware  Of x-ray results

## 2017-08-27 ENCOUNTER — Encounter: Payer: Self-pay | Admitting: Gastroenterology

## 2017-08-27 ENCOUNTER — Telehealth (INDEPENDENT_AMBULATORY_CARE_PROVIDER_SITE_OTHER): Payer: Self-pay | Admitting: Orthopaedic Surgery

## 2017-08-27 NOTE — Telephone Encounter (Signed)
Please see below. Patient's daughter would like to speak with you regarding procedure for her mom.

## 2017-08-27 NOTE — Telephone Encounter (Signed)
Patient's daughter [name] called asked for a call back concerning her mother. [name] advised she wanted to talk about the status of the procedure. The number to contact [name] is [phone] or [phone]

## 2017-08-27 NOTE — Telephone Encounter (Signed)
I called discussed.  Pt has URI or cough and currently on ABX. This needs to be resolved before surgery scheduled. I think I did blue sheet already. Cardiology clearance done. Once pulmonary problem resolved OK to schedule.

## 2017-08-28 ENCOUNTER — Ambulatory Visit (INDEPENDENT_AMBULATORY_CARE_PROVIDER_SITE_OTHER): Payer: Medicare Other | Admitting: Pediatrics

## 2017-08-28 ENCOUNTER — Encounter: Payer: Self-pay | Admitting: Pediatrics

## 2017-08-28 VITALS — BP 131/83 | HR 67 | Temp 98.2°F | Ht 62.0 in | Wt 154.6 lb

## 2017-08-28 DIAGNOSIS — I10 Essential (primary) hypertension: Secondary | ICD-10-CM | POA: Diagnosis not present

## 2017-08-28 DIAGNOSIS — R05 Cough: Secondary | ICD-10-CM | POA: Diagnosis not present

## 2017-08-28 DIAGNOSIS — R059 Cough, unspecified: Secondary | ICD-10-CM

## 2017-08-28 MED ORDER — LOSARTAN POTASSIUM 50 MG PO TABS: ORAL_TABLET | ORAL | 2 refills | Status: DC

## 2017-08-28 MED ORDER — LORATADINE 10 MG PO TABS: 10.0000 mg | ORAL_TABLET | Freq: Every day | ORAL | 5 refills | Status: DC

## 2017-08-28 NOTE — Progress Notes (Signed)
  Subjective:   Patient ID: [name], female    DOB: Feb 08, 1935, 83 y.o.   MRN: [id number] CC: Follow-up (3 month) Multiple medical problems HPI: [name] is a 83 y.o. female   Here today with her friend.  Recently treated with antibiotics for upper respiratory symptoms.  Now antibiotics have been finished.  No fevers.  Some lingering cough but better from when I saw her last week.  Appetite is normal.  Still feels slightly limited compared to usual with her walking.  Think she can walk a quarter of a mile without getting short of breath right now.  As for something for allergies which she thinks is getting worse.  Primarily bothered by nasal congestion and itchy eyes.  Hypertension: Taking her blood pressure medicine regularly.  Relevant past medical, surgical, family and social history reviewed. Allergies and medications reviewed and updated. Social History   Tobacco Use  Smoking Status Current Some Day Smoker  . Packs/day: 0.10  . Years: 60.00  . Pack years: 6.00  . Types: Cigarettes  Smokeless Tobacco Never Used  Tobacco Comment   Smokes a cigarette maybe once a week at the most when she feel anxious   ROS: Per HPI   Objective:    BP 131/83   Pulse 67   Temp 98.2 F (36.8 C) (Oral)   Ht 5\' 2"  (1.575 m)   Wt 154 lb 9.6 oz (70.1 kg)   BMI 28.28 kg/m   Wt Readings from Last 3 Encounters:  08/28/17 154 lb 9.6 oz (70.1 kg)  08/23/17 153 lb 6.4 oz (69.6 kg)  08/21/17 154 lb (69.9 kg)    O2 sat 96% room air, stayed 96% with walking 3 min  Gen: NAD, alert, cooperative with exam, NCAT EYES: EOMI, no conjunctival injection, or no icterus ENT: OP without erythema LYMPH: no cervical LAD CV: NRRR, normal S1/S2, no murmur, distal pulses 2+ b/l Resp: CTABL, no wheezes, normal WOB Abd: +BS, soft, NTND.  Ext: No edema, warm Neuro: Alert and oriented MSK: normal muscle bulk  Assessment & Plan:  [name] was seen today for follow-up multiple medical  problems  Diagnoses and all orders for this visit:  Cough Improving.  Oxygen saturations remaining in the 90s with walking.  Now off of antibiotics.  Essential hypertension Stable, continue below -     losartan (COZAAR) 50 MG tablet; Take 1/2 tablet (25 mg total) by mouth daily.  Other orders -     loratadine (CLARITIN) 10 MG tablet; Take 1 tablet (10 mg total) by mouth daily.   Follow up plan: 3 months, sooner if needed [name], MD [facility]

## 2017-08-31 ENCOUNTER — Encounter: Payer: Self-pay | Admitting: Pediatrics

## 2017-09-02 ENCOUNTER — Encounter: Payer: Self-pay | Admitting: Family Medicine

## 2017-09-02 ENCOUNTER — Telehealth: Payer: Self-pay | Admitting: Pediatrics

## 2017-09-02 ENCOUNTER — Ambulatory Visit (INDEPENDENT_AMBULATORY_CARE_PROVIDER_SITE_OTHER): Payer: Medicare Other | Admitting: Family Medicine

## 2017-09-02 VITALS — BP 134/81 | HR 71 | Temp 98.0°F | Ht 62.0 in | Wt 150.6 lb

## 2017-09-02 DIAGNOSIS — R05 Cough: Secondary | ICD-10-CM | POA: Diagnosis not present

## 2017-09-02 DIAGNOSIS — J45901 Unspecified asthma with (acute) exacerbation: Secondary | ICD-10-CM | POA: Diagnosis not present

## 2017-09-02 DIAGNOSIS — R059 Cough, unspecified: Secondary | ICD-10-CM

## 2017-09-02 MED ORDER — FLUTICASONE-UMECLIDIN-VILANT 100-62.5-25 MCG/INH IN AEPB: 1.0000 | INHALATION_SPRAY | Freq: Every day | RESPIRATORY_TRACT | 0 refills | Status: DC

## 2017-09-02 MED ORDER — PREDNISONE 10 MG PO TABS: ORAL_TABLET | ORAL | 0 refills | Status: DC

## 2017-09-02 NOTE — Telephone Encounter (Signed)
Daughter aware.

## 2017-09-02 NOTE — Progress Notes (Signed)
   HPI  Patient presents today for persistent cough.  Patient states she has been ill now for about 4 weeks.  She describes shortness of breath, cough productive yellow sputum, chills, and decreased appetite.  She was initially seen on April 13 and treated with azithromycin plus prednisone with mild improvement transiently. She was then seen again on April 24 treated with Levaquin and a Celestone injection.  She presented again on 4/26 and had a clear chest x-ray. On May 1 it seemed that she was getting better, however now she states that she has not improved.  She did have some improvement with prednisone she reports.  She has a history of asthma, she is an intermittent smoker. Asthma was diagnosed with PFTs by pulmonology.  PMH: Smoking status noted ROS: Per HPI  Objective: BP 134/81   Pulse 71   Temp 98 F (36.7 C) (Oral)   Ht 5\' 2"  (1.575 m)   Wt 150 lb 9.6 oz (68.3 kg)   SpO2 93%   BMI 27.55 kg/m  Gen: NAD, alert, cooperative with exam HEENT: NCAT CV: RRR, good S1/S2, no murmur Resp: CTABL, no wheezes, non-labored Ext: No edema, warm Neuro: Alert and oriented, No gross deficits  Assessment and plan:  #Asthma, asthma exacerbation, cough Patient with likely asthma exacerbation, given along the course of prednisone Also referred to pulmonology Possible development of COPD in the interim, escalate Symbicort to trelegy, given samples.     Orders Placed This Encounter  Procedures  . Ambulatory referral to Pulmonology    Referral Priority:   Urgent    Referral Type:   Consultation    Referral Reason:   Specialty Services Required    Referred to Provider:   , MD    Requested Specialty:   Pulmonary Disease    Number of Visits Requested:   1    Meds ordered this encounter  Medications  . Fluticasone-Umeclidin-Vilant (TRELEGY ELLIPTA) 100-62.5-25 MCG/INH AEPB    Sig: Inhale 1 puff into the lungs daily.    Dispense:  2 each    Refill:  0  .  predniSONE (DELTASONE) 10 MG tablet    Sig: Take 4 pills a day for 3 days, then 3 pills a day for 3 days, then 2 pills a day for 3 days, then 1 pill a day for 3 days, then stop    Dispense:  30 tablet    Refill:  0    05-29-1985, MD Murtis Sink [facility] 09/02/2017, 11:32 AM

## 2017-09-02 NOTE — Patient Instructions (Signed)
Great to see you!  Try replacing symbicort with trelegy for 2-4 weeks, start prednisone today  We will contact pulmonology for an appointment

## 2017-09-02 NOTE — Telephone Encounter (Signed)
Believe the patient is having asthma exacerbation, she may have progressed to COPD from asthma. She has been treated with 2 course of antibiotics and had a clear CXR- I dont think more antibiotics are necessary.   I have given her a longer course of prednisone, have also recommended that she see pulmonology and a referral was placed.  She was seen by pulmonology about 4 years ago when she was diagnosed with asthma.  I also recommended that she hold off on surgery until she is fully recovered.  I replaced Symbicort with Trelegy  Murtis Sink, MD [facility] 09/02/2017, 12:16 PM

## 2017-09-04 ENCOUNTER — Telehealth (INDEPENDENT_AMBULATORY_CARE_PROVIDER_SITE_OTHER): Payer: Self-pay | Admitting: Orthopaedic Surgery

## 2017-09-04 ENCOUNTER — Ambulatory Visit: Payer: Medicare Other | Admitting: Family Medicine

## 2017-09-04 NOTE — Telephone Encounter (Signed)
Patient's daughter [name]  [phone] called to say that her mom should wait on scheduling her shoulder surgery because her PCP wants her to follow up with a Pulmonologist. Patient has a follow up visit with Dr. Ermalinda Memos on 09-30-17.  Patient has been advised to hold off on her surgery.    Please see the following:      Believe the patient is having asthma exacerbation, she may have progressed to COPD from asthma. She has been treated with 2 course of antibiotics and had a clear CXR- I dont think more antibiotics are necessary.   I have given her a longer course of prednisone, have also recommended that she see pulmonology and a referral was placed.  She was seen by pulmonology about 4 years ago when she was diagnosed with asthma.  I also recommended that she hold off on surgery until she is fully recovered.  I replaced Symbicort with Trelegy  Murtis Sink, MD [facility] 09/02/2017, 12:16 PM

## 2017-09-05 NOTE — Telephone Encounter (Signed)
Please see below. Patient advised to hold on surgery.

## 2017-09-07 ENCOUNTER — Encounter: Payer: Self-pay | Admitting: Family Medicine

## 2017-09-07 ENCOUNTER — Ambulatory Visit (INDEPENDENT_AMBULATORY_CARE_PROVIDER_SITE_OTHER): Payer: Medicare Other | Admitting: Family Medicine

## 2017-09-07 VITALS — BP 158/72 | HR 64 | Temp 98.2°F | Ht 62.0 in | Wt 154.4 lb

## 2017-09-07 DIAGNOSIS — R0600 Dyspnea, unspecified: Secondary | ICD-10-CM

## 2017-09-07 MED ORDER — BETAMETHASONE SOD PHOS & ACET 6 (3-3) MG/ML IJ SUSP
6.0000 mg | Freq: Once | INTRAMUSCULAR | Status: AC
  Administered 2017-09-07: 6 mg via INTRAMUSCULAR

## 2017-09-07 MED ORDER — MOXIFLOXACIN HCL 400 MG PO TABS: 400.0000 mg | ORAL_TABLET | Freq: Every day | ORAL | 0 refills | Status: DC

## 2017-09-07 NOTE — Progress Notes (Signed)
cele

## 2017-09-07 NOTE — Progress Notes (Signed)
Subjective:  Patient ID: [name], female    DOB: Feb 19, 1935  Age: 83 y.o. MRN: [id number]  CC: Follow-up (pt here today c/o SOB and cough that isn't getting any better and has an appt with pulmonology early June)   HPI POET HINEMAN presents for continues to have significant weakness, no energy and fatigue related to the ongoing cough and congestion that she has been experiencing.  This was first treated several weeks ago with a Z-Pak and followed with levofloxacin.  She has had steroid injections and earlier this week in fact received a steroid taper per Dr. Ermalinda Memos.  She is also been seen twice a in between by Dr. Oswaldo Done.  Dr. Oswaldo Done ordered a chest x-ray which showed normal lung structure and no infiltrate.  In the interim she has waxed and waned with regard to symptoms she seems to be getting better at the time she was seen by Dr. Oswaldo Done on May 1 but has since continued to get worse.  This in spite of the steroid taper started 5 days ago.  She has 1 day left of that.  Dr. Felipa Emory note reflects that she was started on Trelegy inhaler.  However she brings her inhalers with her and she has a Brio and Symbicort inhaler but she does not know what the Trelegy inhaler is.  Meanwhile she continues to cough thick dark green sputum and reports shortness of breath with mild to moderate exertion and.  She can walk around her house but walking from her car to the office requires her to rest a couple of times on the way.  Of note is that although her BNP was recently normal, prior to that time her proBNP was significantly abnormal.  Her chest x-ray showed normal heart contours however.  Depression screen [facility] 2/9 09/02/2017 08/28/2017 08/23/2017  Decreased Interest 0 0 0  Down, Depressed, Hopeless 0 0 0  PHQ - 2 Score 0 0 0  Altered sleeping - - -  Tired, decreased energy - - -  Change in appetite - - -  Feeling bad or failure about yourself  - - -  Trouble concentrating - - -  Moving slowly or  fidgety/restless - - -  Suicidal thoughts - - -  PHQ-9 Score - - -  Difficult doing work/chores - - -  Some recent data might be hidden    History Roy has a past medical history of Allergic rhinitis, Anxiety, Arthritis, Cataract, Depression, Elevated troponin (04/05/2016), Essential hypertension, benign, GERD (gastroesophageal reflux disease), History of blood transfusion, Left bundle branch block, Memory loss, Mixed hyperlipidemia, Occipital neuralgia of left side, Osteopenia, Secondary cardiomyopathy (HCC), and Urinary urgency.   She has a past surgical history that includes Laparoscopic cholecystectomy (1991); Tonsillectomy; Appendectomy; Back surgery; Shoulder open rotator cuff repair (Right, 2011); Cataract extraction w/PHACO (04/16/2011); Hammer toe surgery (Bilateral, 11/22/11); Cataract extraction w/PHACO (01/28/2012); cysto with hydrodistension (05/29/2012); Lumbar laminectomy/decompression microdiscectomy (Left, 06/06/2012); Total hip arthroplasty (Right, 03/13/2013); Total abdominal hysterectomy; Lumbar laminectomy (2011); Joint replacement; Cardiac catheterization (2010); and Dilation and curettage of uterus (?<hysterectomy").   Her family history includes Allergies in her other; Cancer (age of onset: 90) in her brother; Diabetes in her brother, brother, brother, father, other, sister, sister, and son; Hypertension in her sister.She reports that she has been smoking cigarettes.  She has a 6.00 pack-year smoking history. She has never used smokeless tobacco. She reports that she does not drink alcohol or use drugs.    ROS Review of Systems  Constitutional: Positive for activity change and fatigue. Negative for appetite change.  HENT: Negative for congestion.   Eyes: Negative for visual disturbance.  Respiratory: Positive for cough, chest tightness, shortness of breath and wheezing. Negative for apnea.   Cardiovascular: Negative for chest pain, palpitations and leg swelling.    Gastrointestinal: Negative for abdominal pain, constipation, diarrhea, nausea and vomiting.  Musculoskeletal: Negative for arthralgias and myalgias.  Neurological: Positive for weakness (Nonfocal). Negative for headaches.  Psychiatric/Behavioral: Negative for sleep disturbance.    Objective:  BP (!) 158/72   Pulse 64   Temp 98.2 F (36.8 C) (Oral)   Ht 5\' 2"  (1.575 m)   Wt 154 lb 6 oz (70 kg)   BMI 28.24 kg/m   BP Readings from Last 3 Encounters:  09/07/17 (!) 158/72  09/02/17 134/81  08/28/17 131/83    Wt Readings from Last 3 Encounters:  09/07/17 154 lb 6 oz (70 kg)  09/02/17 150 lb 9.6 oz (68.3 kg)  08/28/17 154 lb 9.6 oz (70.1 kg)     Physical Exam  Constitutional: She is oriented to person, place, and time. She appears well-developed and well-nourished. No distress.  HENT:  Head: Normocephalic and atraumatic.  Eyes: Pupils are equal, round, and reactive to light. Conjunctivae are normal.  Neck: Normal range of motion. Neck supple. No thyromegaly present.  Cardiovascular: Normal rate, regular rhythm and normal heart sounds.  No murmur heard. Pulmonary/Chest: Effort normal. No respiratory distress. She has wheezes (Few scattered). She has no rales.  Abdominal: Soft. Bowel sounds are normal. She exhibits no distension. There is no tenderness.  Musculoskeletal: Normal range of motion.  Lymphadenopathy:    She has no cervical adenopathy.  Neurological: She is alert and oriented to person, place, and time.  Skin: Skin is warm and dry.  Psychiatric: She has a normal mood and affect. Her behavior is normal. Thought content normal.      Assessment & Plan:   There are no diagnoses linked to this encounter.     I am having 10/28/17 maintain her aspirin EC, Multiple Vitamins-Minerals (CENTRUM SILVER PO), DYMISTA, albuterol, SYMBICORT, escitalopram, esomeprazole, furosemide, potassium chloride SA, pravastatin, carvedilol, raloxifene, buPROPion, loratadine,  losartan, Fluticasone-Umeclidin-Vilant, and predniSONE.  Allergies as of 09/07/2017      Reactions   Lisinopril-hydrochlorothiazide Other (See Comments)   Lip swelling and cough. Stopped by ENT. Patient is on Losartan without any issues    Aricept [donepezil Hcl]    Nausea and vomiting   Codeine Itching, Swelling   eye irritation (prescribed percocet)   Namenda [memantine Hcl]    Nausea and vomiting   Penicillins Itching, Swelling   Has patient had a PCN reaction causing immediate rash, facial/tongue/throat swelling, SOB or lightheadedness with hypotension: Yes Has patient had a PCN reaction causing severe rash involving mucus membranes or skin necrosis: No Has patient had a PCN reaction that required hospitalization No Has patient had a PCN reaction occurring within the last 10 years: No If all of the above answers are "NO", then may proceed with Cephalosporin use. eye irritation      Medication List        Accurate as of 09/07/17 11:47 AM. Always use your most recent med list.          albuterol 108 (90 Base) MCG/ACT inhaler Commonly known as:  PROVENTIL HFA;VENTOLIN HFA Inhale 2 puffs into the lungs every 6 (six) hours as needed for wheezing or shortness of breath.   aspirin EC 81 MG  tablet Take 81 mg by mouth daily.   buPROPion 150 MG 12 hr tablet Commonly known as:  WELLBUTRIN SR Take 150 mg by mouth 2 (two) times daily.   carvedilol 6.25 MG tablet Commonly known as:  COREG Take 6.25 mg by mouth 2 (two) times daily with a meal.   CENTRUM SILVER PO Take 1 tablet by mouth daily.   DYMISTA 137-50 MCG/ACT Susp Generic drug:  Azelastine-Fluticasone One spray per nostril twice a day   escitalopram 10 MG tablet Commonly known as:  LEXAPRO Take 1 tablet (10 mg total) by mouth daily.   esomeprazole 40 MG capsule Commonly known as:  NEXIUM Take 1 capsule (40 mg total) by mouth daily before breakfast.   Fluticasone-Umeclidin-Vilant 100-62.5-25 MCG/INH Aepb Commonly  known as:  TRELEGY ELLIPTA Inhale 1 puff into the lungs daily.   furosemide 20 MG tablet Commonly known as:  LASIX Take 1 tablet (20 mg total) by mouth daily. Pt to take 40 mg for 2 days then decrease to 20 mg daily   loratadine 10 MG tablet Commonly known as:  CLARITIN Take 1 tablet (10 mg total) by mouth daily.   losartan 50 MG tablet Commonly known as:  COZAAR Take 1/2 tablet (25 mg total) by mouth daily.   potassium chloride SA 20 MEQ tablet Commonly known as:  K-DUR,KLOR-CON Take 1 tablet (20 mEq total) by mouth daily.   pravastatin 80 MG tablet Commonly known as:  PRAVACHOL Take 1 tablet (80 mg total) by mouth daily.   predniSONE 10 MG tablet Commonly known as:  DELTASONE Take 4 pills a day for 3 days, then 3 pills a day for 3 days, then 2 pills a day for 3 days, then 1 pill a day for 3 days, then stop   raloxifene 60 MG tablet Commonly known as:  EVISTA Take 60 mg by mouth daily.   SYMBICORT 160-4.5 MCG/ACT inhaler Generic drug:  budesonide-formoterol Inhale 2 puffs into the lungs 2 (two) times daily.        Follow-up: Return in about 4 days (around 09/11/2017).  Mechele Claude, M.D.

## 2017-09-08 LAB — CMP14+EGFR
ALT: 23 IU/L (ref 0–32)
AST: 27 IU/L (ref 0–40)
Albumin/Globulin Ratio: 1.8 (ref 1.2–2.2)
Albumin: 3.9 g/dL (ref 3.5–4.7)
Alkaline Phosphatase: 101 IU/L (ref 39–117)
BILIRUBIN TOTAL: 0.2 mg/dL (ref 0.0–1.2)
BUN/Creatinine Ratio: 16 (ref 12–28)
BUN: 16 mg/dL (ref 8–27)
CALCIUM: 9.3 mg/dL (ref 8.7–10.3)
CHLORIDE: 109 mmol/L — AB (ref 96–106)
CO2: 25 mmol/L (ref 20–29)
Creatinine, Ser: 1.01 mg/dL — ABNORMAL HIGH (ref 0.57–1.00)
GFR calc non Af Amer: 53 mL/min/{1.73_m2} — ABNORMAL LOW (ref >59)
GFR, EST AFRICAN AMERICAN: 61 mL/min/{1.73_m2} (ref >59)
GLUCOSE: 84 mg/dL (ref 65–99)
Globulin, Total: 2.2 g/dL (ref 1.5–4.5)
Potassium: 4 mmol/L (ref 3.5–5.2)
Sodium: 147 mmol/L — ABNORMAL HIGH (ref 134–144)
TOTAL PROTEIN: 6.1 g/dL (ref 6.0–8.5)

## 2017-09-08 LAB — CBC WITH DIFFERENTIAL/PLATELET
BASOS ABS: 0 10*3/uL (ref 0.0–0.2)
Basos: 0 %
EOS (ABSOLUTE): 0.4 10*3/uL (ref 0.0–0.4)
Eos: 5 %
Hematocrit: 39.6 % (ref 34.0–46.6)
Hemoglobin: 12.8 g/dL (ref 11.1–15.9)
IMMATURE GRANS (ABS): 0 10*3/uL (ref 0.0–0.1)
IMMATURE GRANULOCYTES: 0 %
LYMPHS: 30 %
Lymphocytes Absolute: 2.3 10*3/uL (ref 0.7–3.1)
MCH: 30.1 pg (ref 26.6–33.0)
MCHC: 32.3 g/dL (ref 31.5–35.7)
MCV: 93 fL (ref 79–97)
Monocytes Absolute: 0.4 10*3/uL (ref 0.1–0.9)
Monocytes: 5 %
NEUTROS PCT: 60 %
Neutrophils Absolute: 4.5 10*3/uL (ref 1.4–7.0)
PLATELETS: 288 10*3/uL (ref 150–379)
RBC: 4.25 x10E6/uL (ref 3.77–5.28)
RDW: 14.9 % (ref 12.3–15.4)
WBC: 7.6 10*3/uL (ref 3.4–10.8)

## 2017-09-08 LAB — PRO B NATRIURETIC PEPTIDE: NT-Pro BNP: 1147 pg/mL — ABNORMAL HIGH (ref 0–738)

## 2017-09-30 ENCOUNTER — Ambulatory Visit: Payer: Medicare Other | Admitting: Family Medicine

## 2017-10-01 ENCOUNTER — Ambulatory Visit (INDEPENDENT_AMBULATORY_CARE_PROVIDER_SITE_OTHER): Payer: Medicare Other | Admitting: Pediatrics

## 2017-10-01 ENCOUNTER — Encounter: Payer: Self-pay | Admitting: Pediatrics

## 2017-10-01 ENCOUNTER — Ambulatory Visit: Payer: Medicare Other | Admitting: Pediatrics

## 2017-10-01 VITALS — BP 131/83 | HR 77 | Temp 98.0°F | Ht 62.0 in | Wt 153.0 lb

## 2017-10-01 DIAGNOSIS — J449 Chronic obstructive pulmonary disease, unspecified: Secondary | ICD-10-CM

## 2017-10-01 DIAGNOSIS — M545 Low back pain, unspecified: Secondary | ICD-10-CM

## 2017-10-01 MED ORDER — FLUTICASONE FUROATE-VILANTEROL 200-25 MCG/INH IN AEPB: 1.0000 | INHALATION_SPRAY | Freq: Every day | RESPIRATORY_TRACT | 2 refills | Status: DC

## 2017-10-01 NOTE — Progress Notes (Signed)
  Subjective:   Patient ID: [name], female    DOB: August 20, 1934, 83 y.o.   MRN: [id number] CC: Back Pain  HPI: [name] is a 83 y.o. female   Low back pain: Noticed it more over the last couple weeks.  No pain right now.  Certain activities seem to make it worse, mopping, bending down.  Pain does not last after she stops the exacerbating activities.  She is taken 1 Aleve a few times with good improvement in symptoms.  Breathing has been better.  She thinks back to baseline.  No longer coughing.  Not needing albuterol regularly.  I asked about controller medicine, she has both Symbicort and trilogy on her medicine list.  She pulls Breo out of her purse and says it is what she has been taking.  Son in nursing home for the next 2 weeks.  Relevant past medical, surgical, family and social history reviewed. Allergies and medications reviewed and updated. Social History   Tobacco Use  Smoking Status Current Some Day Smoker  . Packs/day: 0.10  . Years: 60.00  . Pack years: 6.00  . Types: Cigarettes  Smokeless Tobacco Never Used  Tobacco Comment   Smokes a cigarette maybe once a week at the most when she feel anxious   ROS: Per HPI   Objective:    BP 131/83   Pulse 77   Temp 98 F (36.7 C) (Oral)   Ht 5\' 2"  (1.575 m)   Wt 153 lb (69.4 kg)   BMI 27.98 kg/m   Wt Readings from Last 3 Encounters:  10/01/17 153 lb (69.4 kg)  09/07/17 154 lb 6 oz (70 kg)  09/02/17 150 lb 9.6 oz (68.3 kg)     Gen: NAD, alert, cooperative with exam, NCAT EYES: EOMI, no conjunctival injection, or no icterus CV: NRRR, normal S1/S2, no murmur, distal pulses 2+ b/l Resp: CTABL, no wheezes, normal WOB Abd: +BS, soft, NTND.  Ext: No edema, warm Neuro: Alert and oriented, sensation intact b/l LE MSK: No point tenderness over spine, no tenderness with palpation paraspinal muscles. Neg SLR.  Assessment & Plan:  [name] was seen today for back pain.  Diagnoses and all orders for this  visit:  Acute midline low back pain without sciatica No pain now.  Worsens with certain activities.  Discussed avoiding activities that make pain worse.  Okay to take naproxen, 1 tab daily as needed for significant pain. If needing regularly let me know.  Chronic obstructive pulmonary disease, unspecified COPD type (HCC) improved, continue below. -     fluticasone furoate-vilanterol (BREO ELLIPTA) 200-25 MCG/INH AEPB; Inhale 1 puff into the lungs daily.   Follow up plan: Return in about 3 months (around 01/01/2018). 03/03/2018, MD Rex Kras [facility]

## 2017-10-04 ENCOUNTER — Institutional Professional Consult (permissible substitution): Payer: Self-pay | Admitting: Internal Medicine

## 2017-10-07 ENCOUNTER — Telehealth: Payer: Self-pay | Admitting: Pediatrics

## 2017-10-07 NOTE — Telephone Encounter (Signed)
Spoke with daughter [name] about changing Pulmonary appointment. I told her since the original appointment was rescheduled I was not able to change the new appointment. Daughter was given Dr. Juanetta Gosling number and she will call to see if pt can be seen at an earlier date.

## 2017-10-17 NOTE — Telephone Encounter (Signed)
Error in charting.

## 2017-10-23 ENCOUNTER — Other Ambulatory Visit: Payer: Self-pay | Admitting: Pediatrics

## 2017-10-23 DIAGNOSIS — F411 Generalized anxiety disorder: Secondary | ICD-10-CM

## 2017-10-26 ENCOUNTER — Other Ambulatory Visit: Payer: Self-pay | Admitting: Pediatrics

## 2017-10-29 ENCOUNTER — Other Ambulatory Visit: Payer: Self-pay | Admitting: Pediatrics

## 2017-10-29 ENCOUNTER — Encounter: Payer: Self-pay | Admitting: Family Medicine

## 2017-10-29 ENCOUNTER — Ambulatory Visit (INDEPENDENT_AMBULATORY_CARE_PROVIDER_SITE_OTHER): Payer: Medicare Other | Admitting: Family Medicine

## 2017-10-29 VITALS — BP 138/75 | HR 65 | Temp 98.3°F | Ht 62.0 in | Wt 152.8 lb

## 2017-10-29 DIAGNOSIS — M5481 Occipital neuralgia: Secondary | ICD-10-CM | POA: Diagnosis not present

## 2017-10-29 MED ORDER — GABAPENTIN 100 MG PO CAPS: ORAL_CAPSULE | ORAL | 3 refills | Status: DC

## 2017-10-29 NOTE — Patient Instructions (Signed)
Great to see you!  I believe you have occipital neualgia  Gabapentin 1 capsule daily at night 1 week later if you are doing okay with the medication you may take it twice daily. 1 week after that if you are not have any daytime drowsiness you may take it 3 times daily.  Please see your PCP in 3 to 4 weeks for follow-up.  Please see Dr. Terrace Arabia, your neurologist to discuss this problem. [facility]:  [phone]

## 2017-10-29 NOTE — Progress Notes (Signed)
   HPI  Patient presents today for pain.  Patient explains she has itching and tingling type pain of the left side of the scalp on the posterior head, parietal/occipital area that has been going on for over a year.  She states that is becoming more frequent, it happens several days a week. She states that when it comes on it lasts about 1 hour and improves if she lays down on that side and takes a short nap.  PMH: Smoking status noted ROS: Per HPI  Objective: BP 138/75   Pulse 65   Temp 98.3 F (36.8 C) (Oral)   Ht 5\' 2"  (1.575 m)   Wt 152 lb 12.8 oz (69.3 kg)   BMI 27.95 kg/m  Gen: NAD, alert, cooperative with exam HEENT: NCAT, EOMI, PERRLA CV: RRR, good S1/S2, no murmur Resp: CTABL, no wheezes, non-labored Ext: No edema, warm Neuro: Alert and oriented, strength 4/5 and symmetric in bilateral grip, bilateral upper extremities with normal sensation  Assessment and plan:  #Occipital neuralgia Trial of gabapentin, starting very low at 100 mg at night, may increase to twice daily after 1 week then may increase to 3 times daily after 2 weeks of therapy.  I did recommend follow-up with neurology and her PCP.  Meds ordered this encounter  Medications  . gabapentin (NEURONTIN) 100 MG capsule    Sig: 1 capsule at night for 1 week, then 1 capsule twice daily for 1 week, then 1 capsule 3 times daily.    Dispense:  90 capsule    Refill:  3    , MD Murtis Sink [facility] 10/29/2017, 9:35 AM

## 2017-11-07 ENCOUNTER — Encounter: Payer: Self-pay | Admitting: Pulmonary Disease

## 2017-11-07 ENCOUNTER — Other Ambulatory Visit (INDEPENDENT_AMBULATORY_CARE_PROVIDER_SITE_OTHER): Payer: Medicare Other

## 2017-11-07 ENCOUNTER — Ambulatory Visit (INDEPENDENT_AMBULATORY_CARE_PROVIDER_SITE_OTHER): Payer: Medicare Other | Admitting: Pulmonary Disease

## 2017-11-07 VITALS — BP 122/80 | HR 75 | Ht 64.0 in | Wt 152.2 lb

## 2017-11-07 DIAGNOSIS — R0609 Other forms of dyspnea: Secondary | ICD-10-CM

## 2017-11-07 LAB — CBC WITH DIFFERENTIAL/PLATELET
BASOS ABS: 0.1 10*3/uL (ref 0.0–0.1)
Basophils Relative: 0.9 % (ref 0.0–3.0)
Eosinophils Absolute: 0.4 10*3/uL (ref 0.0–0.7)
Eosinophils Relative: 5.7 % — ABNORMAL HIGH (ref 0.0–5.0)
HEMATOCRIT: 42.6 % (ref 36.0–46.0)
HEMOGLOBIN: 14.2 g/dL (ref 12.0–15.0)
LYMPHS ABS: 1.9 10*3/uL (ref 0.7–4.0)
Lymphocytes Relative: 25.2 % (ref 12.0–46.0)
MCHC: 33.3 g/dL (ref 30.0–36.0)
MCV: 92 fl (ref 78.0–100.0)
MONOS PCT: 4.1 % (ref 3.0–12.0)
Monocytes Absolute: 0.3 10*3/uL (ref 0.1–1.0)
Neutro Abs: 4.9 10*3/uL (ref 1.4–7.7)
Neutrophils Relative %: 64.1 % (ref 43.0–77.0)
PLATELETS: 283 10*3/uL (ref 150.0–400.0)
RBC: 4.63 Mil/uL (ref 3.87–5.11)
RDW: 14.5 % (ref 11.5–15.5)
WBC: 7.7 10*3/uL (ref 4.0–10.5)

## 2017-11-07 NOTE — Patient Instructions (Addendum)
Continue the Breo Your lungs are clear today.  I believe you should be okay for the planned orthopedic surgery.  We will send the results of her assessment to Dr. [name] We will schedule you for pulmonary function test for baseline assessment We will check CBC differential, blood allergy profile today. Follow-up in 1 to 2 months.

## 2017-11-07 NOTE — Progress Notes (Signed)
[name]    [id number]    May 31, 1934  Primary Care Physician:Vincent, Norwood Levo, MD  Referring Physician: Elenora Gamma, MD [address] [place], [place] [address]  Chief complaint: Consult for cough, asthma, preop clearance for shoulder orthopedic surgery.  HPI: 83 year old with past medical history of asthma, heart failure GERD Seen at her primary care several times this year for asthma exacerbation treated with antibiotics and prednisone.  She had a chest x-ray in March 2019 which did not show acute abnormality.  States that her breathing is doing better since this episode.  Feels she is back to baseline.  Chief complaint is mild dyspnea on exertion.  No symptoms at rest.  No cough, sputum production, wheezing.  Maintained on breo.  There is some confusion about which inhaler she is on as there are notes from primary care stating that she also has Symbicort and trelegy. Has albuterol inhaler which she uses twice a day Diagnosed with asthma in 2013 with a positive methacholine challenge test, spirometry showing mild obstruction She has a 6 pack-year smoking history.  Continues to smoke a couple of cigarettes a day.  She is being evaluated by Dr. [name] for possible arthroscopic surgery of right shoulder and requires pulmonary clearance She also has seen by cardiology and cleared.  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 Exposures: No known exposures, no mold, hot tub, Jacuzzi Smoking history: 6-pack-year smoker.  Continues to smoke 2 to 3 cigarettes a day Travel history: No significant travel  Outpatient Encounter Medications as of 11/07/2017  Medication Sig  . albuterol (PROVENTIL HFA;VENTOLIN HFA) 108 (90 Base) MCG/ACT inhaler Inhale 2 puffs into the lungs every 6 (six) hours as needed for wheezing or shortness of breath.  Marland Kitchen aspirin EC 81 MG tablet Take 81 mg by mouth daily.  Marland Kitchen buPROPion (WELLBUTRIN SR) 150 MG 12 hr tablet TAKE (1)  TABLET TWICE A DAY.  . carvedilol (COREG) 6.25 MG tablet Take 6.25 mg by mouth 2 (two) times daily with a meal.  . DYMISTA 137-50 MCG/ACT SUSP One spray per nostril twice a day  . escitalopram (LEXAPRO) 10 MG tablet Take 1 tablet (10 mg total) by mouth daily.  Marland Kitchen esomeprazole (NEXIUM) 40 MG capsule Take 1 capsule (40 mg total) by mouth daily before breakfast.  . fluticasone furoate-vilanterol (BREO ELLIPTA) 200-25 MCG/INH AEPB Inhale 1 puff into the lungs daily.  Marland Kitchen gabapentin (NEURONTIN) 100 MG capsule 1 capsule at night for 1 week, then 1 capsule twice daily for 1 week, then 1 capsule 3 times daily.  Marland Kitchen loratadine (CLARITIN) 10 MG tablet Take 1 tablet (10 mg total) by mouth daily.  Marland Kitchen losartan (COZAAR) 50 MG tablet Take 1/2 tablet (25 mg total) by mouth daily.  . Multiple Vitamins-Minerals (CENTRUM SILVER PO) Take 1 tablet by mouth daily.  . potassium chloride SA (K-DUR,KLOR-CON) 20 MEQ tablet Take 1 tablet (20 mEq total) by mouth daily.  . pravastatin (PRAVACHOL) 80 MG tablet Take 1 tablet (80 mg total) by mouth daily.  . raloxifene (EVISTA) 60 MG tablet Take 60 mg by mouth daily.  . furosemide (LASIX) 20 MG tablet Take 1 tablet (20 mg total) by mouth daily. Pt to take 40 mg for 2 days then decrease to 20 mg daily   No facility-administered encounter medications on file as of 11/07/2017.     Allergies as of 11/07/2017 - Review Complete 10/29/2017  Allergen Reaction  Noted  . Lisinopril-hydrochlorothiazide Other (See Comments) 06/20/2011  . Aricept [donepezil hcl]  08/22/2016  . Codeine Itching and Swelling 10/08/2007  . Namenda [memantine hcl]  08/22/2016  . Penicillins Itching and Swelling 10/08/2007    Past Medical History:  Diagnosis Date  . Allergic rhinitis   . Anxiety   . Arthritis   . Cataract    had eye surgery  . Depression   . Elevated troponin 04/05/2016  . Essential hypertension, benign   . GERD (gastroesophageal reflux disease)   . History of blood transfusion   . Left  bundle branch block   . Memory loss   . Mixed hyperlipidemia   . Occipital neuralgia of left side   . Osteopenia   . 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 Urinary urgency     Past Surgical History:  Procedure Laterality Date  . APPENDECTOMY    . BACK SURGERY    . CARDIAC CATHETERIZATION  2010   Normal coronaries  . CATARACT EXTRACTION W/PHACO  04/16/2011   Procedure: CATARACT EXTRACTION PHACO AND INTRAOCULAR LENS PLACEMENT (IOC);  Surgeon: Susa Simmonds;  Location: [facility];  Service: Ophthalmology;  Laterality: Left;  CDE=11.35  . CATARACT EXTRACTION W/PHACO  01/28/2012   Procedure: CATARACT EXTRACTION PHACO AND INTRAOCULAR LENS PLACEMENT (IOC);  Surgeon: Susa Simmonds, MD;  Location: [facility];  Service: Ophthalmology;  Laterality: Right;  CDI:8.15  . CYSTO WITH HYDRODISTENSION  05/29/2012   Procedure: CYSTOSCOPY/HYDRODISTENSION;  Surgeon: Martina Sinner, MD;  Location[facility];  Service: Urology;  Laterality: N/A;  INSTILLATION OF MARCAINE AND PYRIDIUM   . DILATION AND CURETTAGE OF UTERUS  ?<hysterectomy"  . HAMMER TOE SURGERY Bilateral 11/22/11   [facility], [name]  . JOINT REPLACEMENT    . LAPAROSCOPIC CHOLECYSTECTOMY  1991  . LUMBAR LAMINECTOMY  2011  . LUMBAR LAMINECTOMY/DECOMPRESSION MICRODISCECTOMY Left 06/06/2012   Procedure: LUMBAR LAMINECTOMY/DECOMPRESSION MICRODISCECTOMY 1 LEVEL;  Surgeon: Maeola Harman, MD;  Location: [facility];  Service: Neurosurgery;  Laterality: Left;  Left Lumbar two-three Laminectomy for resection of synovial cyst  . SHOULDER OPEN ROTATOR CUFF REPAIR Right 2011  . TONSILLECTOMY    . TOTAL ABDOMINAL HYSTERECTOMY    . TOTAL HIP ARTHROPLASTY Right 03/13/2013   Procedure: TOTAL HIP ARTHROPLASTY;  Surgeon: Nestor Lewandowsky, MD;  Location: [facility];  Service: Orthopedics;  Laterality: Right;    Family History  Problem Relation Age of Onset  . Diabetes Father   . Diabetes Brother   . Cancer Brother 55        colon  . Diabetes Brother   . Diabetes Brother   . Hypertension Sister   . Diabetes Sister   . Diabetes Sister   . Diabetes Other   . Allergies Other   . Diabetes Son   . Colon cancer Neg Hx   . Anesthesia problems Neg Hx   . Hypotension Neg Hx   . Malignant hyperthermia Neg Hx   . Pseudochol deficiency Neg Hx     Social History   Socioeconomic History  . Marital status: Widowed    Spouse name: Not on file  . Number of children: 5  . Years of education: 8th grade  . Highest education level: Not on file  Occupational History  . Occupation: Worked in Geneticist, molecular for 30 years    Employer: RETIRED    Comment: Retired  . Occupation: Part time care giver  Social Needs  . Financial resource strain:  Not on file  . Food insecurity:    Worry: Not on file    Inability: Not on file  . Transportation needs:    Medical: Not on file    Non-medical: Not on file  Tobacco Use  . Smoking status: Current Some Day Smoker    Packs/day: 0.10    Years: 60.00    Pack years: 6.00    Types: Cigarettes  . Smokeless tobacco: Never Used  . Tobacco comment: Smokes a cigarette maybe once a week at the most when she feel anxious  Substance and Sexual Activity  . Alcohol use: No    Alcohol/week: 0.0 oz  . Drug use: No  . Sexual activity: Never  Lifestyle  . Physical activity:    Days per week: Not on file    Minutes per session: Not on file  . Stress: Not on file  Relationships  . Social connections:    Talks on phone: Not on file    Gets together: Not on file    Attends religious service: Not on file    Active member of club or organization: Not on file    Attends meetings of clubs or organizations: Not on file    Relationship status: Not on file  . Intimate partner violence:    Fear of current or ex partner: Not on file    Emotionally abused: Not on file    Physically abused: Not on file    Forced sexual activity: Not on file  Other Topics Concern  . Not on file    Social History Narrative   Divorced, lives alone.   Right-handed.   1 cup coffee per day.    Review of systems: Review of Systems  Constitutional: Negative for fever and chills.  HENT: Negative.   Eyes: Negative for blurred vision.  Respiratory: as per HPI  Cardiovascular: Negative for chest pain and palpitations.  Gastrointestinal: Negative for vomiting, diarrhea, blood per rectum. Genitourinary: Negative for dysuria, urgency, frequency and hematuria.  Musculoskeletal: Negative for myalgias, back pain and joint pain.  Skin: Negative for itching and rash.  Neurological: Negative for dizziness, tremors, focal weakness, seizures and loss of consciousness.  Endo/Heme/Allergies: Negative for environmental allergies.  Psychiatric/Behavioral: Negative for depression, suicidal ideas and hallucinations.  All other systems reviewed and are negative.  Physical Exam: Blood pressure 122/80, pulse 75, height 5\' 4"  (1.626 m), weight 152 lb 3.2 oz (69 kg), SpO2 95 %. Gen:      No acute distress HEENT:  EOMI, sclera anicteric Neck:     No masses; no thyromegaly Lungs:    Clear to auscultation bilaterally; normal respiratory effort CV:         Regular rate and rhythm; no murmurs Abd:      + bowel sounds; soft, non-tender; no palpable masses, no distension Ext:    No edema; adequate peripheral perfusion Skin:      Warm and dry; no rash Neuro: alert and oriented x 3 Psych: normal mood and affect  Data Reviewed: CT chest 02/21/15- cardiomegaly, mild vascular congestion. CT abdomen 04/08/2016- visualized lungs show mild groundglass opacities at the bases. Chest x-ray 08/23/2017- no acute cardiopulmonary abnormality.  I have reviewed the images personally.  PFTs with methacholine 07/19/11 FVC 2.66 [136%], FEV1 1.75 [116%], F/F 66 Mild obstruction with positive methacholine test FEV1 change of -23% at 0.25 mg/mL.  Echo 07/08/2017-mild LVH, LVEF 50-55%, grade 1 diastolic dysfunction, PA peak  pressure 34  Assessment:  Asthma Confirmed with positive methacholine  challenge in 2013 Given her ongoing smoking will need to reassess for emphysema, COPD Check pulmonary function test Continue Breo, albuterol.  I have asked her to bring all her inhalers to the office at next visit for review as there appears to be some confusion about which ones she is on  Preop clearance for shoulder surgery She has recovered well from bronchitis, asthma exacerbation in April 2019 Low risk for perioperative complication.  Okay to proceed with surgery.  Plan/Recommendations: - PFTs - Continue Breo - Cleared for shoulder surgery.  Chilton Greathouse MD  Pulmonary and Critical Care 11/07/2017, 9:35 AM  CC: Elenora Gamma, MD

## 2017-11-08 LAB — RESPIRATORY ALLERGY PROFILE REGION II ~~LOC~~
Allergen, Cedar tree, t12: <0.1 kU/L
Allergen, Comm Silver Birch, t9: <0.1 kU/L
Allergen, Mouse Urine Protein, e78: <0.1 kU/L
Allergen, Mulberry, t76: <0.1 kU/L
Allergen, Oak,t7: <0.1 kU/L
Allergen, P. notatum, m1: <0.1 kU/L
Aspergillus fumigatus, m3: <0.1 kU/L
CLADOSPORIUM HERBARUM (M2) IGE: <0.1 kU/L
CLASS: 0
CLASS: 0
CLASS: 0
CLASS: 0
CLASS: 0
CLASS: 0
CLASS: 0
COMMON RAGWEED (SHORT) (W1) IGE: <0.1 kU/L
Cat Dander: <0.1 kU/L
Class: 0
Class: 0
Class: 0
Class: 0
Class: 0
Class: 0
Class: 0
Class: 0
Class: 0
Class: 0
Class: 0
Class: 0
Class: 0
Class: 0
Class: 0
Class: 0
Class: 0
Cockroach: <0.1 kU/L
D. farinae: <0.1 kU/L
IGE (IMMUNOGLOBULIN E), SERUM: 18 kU/L (ref <=114)
Pecan/Hickory Tree IgE: <0.1 kU/L
Rough Pigweed  IgE: <0.1 kU/L

## 2017-11-08 LAB — INTERPRETATION:

## 2017-11-11 ENCOUNTER — Encounter: Payer: Self-pay | Admitting: Pediatrics

## 2017-11-11 ENCOUNTER — Ambulatory Visit (INDEPENDENT_AMBULATORY_CARE_PROVIDER_SITE_OTHER): Payer: Medicare Other | Admitting: Pediatrics

## 2017-11-11 VITALS — BP 134/84 | HR 72 | Temp 98.7°F | Ht 64.0 in | Wt 152.8 lb

## 2017-11-11 DIAGNOSIS — J45909 Unspecified asthma, uncomplicated: Secondary | ICD-10-CM | POA: Diagnosis not present

## 2017-11-11 DIAGNOSIS — M5481 Occipital neuralgia: Secondary | ICD-10-CM | POA: Diagnosis not present

## 2017-11-11 MED ORDER — GABAPENTIN 100 MG PO CAPS: ORAL_CAPSULE | ORAL | 1 refills | Status: DC

## 2017-11-11 MED ORDER — BUDESONIDE-FORMOTEROL FUMARATE 160-4.5 MCG/ACT IN AERO: 2.0000 | INHALATION_SPRAY | Freq: Two times a day (BID) | RESPIRATORY_TRACT | 3 refills | Status: DC

## 2017-11-11 MED ORDER — ALBUTEROL SULFATE HFA 108 (90 BASE) MCG/ACT IN AERS: 2.0000 | INHALATION_SPRAY | Freq: Four times a day (QID) | RESPIRATORY_TRACT | 2 refills | Status: DC | PRN

## 2017-11-11 NOTE — Progress Notes (Signed)
  Subjective:   Patient ID: [name], female    DOB: [birth year], 83 y.o.   MRN: [id number] CC: Headache  HPI: [name] is a 83 y.o. female   Occipital neuralgia: Started on gabapentin today in the morning 2 weeks ago.  She thinks it has been helping with the pain.  She points to the back left side of her head with where the pain is.  She does have some tightness in the muscles in the back of her neck especially on the left side.,  Back because she is her medicines and a pill pack, she has not been able to increase the dose.  She has for refill of the gabapentin.  Asthma: Has been taking Symbicort twice a day.  She feels like her breathing has been fairly well controlled with this.  She is rarely needing albuterol.  She is not sure if she has an albuterol at home.  Relevant past medical, surgical, family and social history reviewed. Allergies and medications reviewed and updated. Social History   Tobacco Use  Smoking Status Current Some Day Smoker  . Packs/day: 0.10  . Years: 60.00  . Pack years: 6.00  . Types: Cigarettes  Smokeless Tobacco Never Used  Tobacco Comment   Smokes a cigarette maybe once a week at the most when she feel anxious   ROS: Per HPI   Objective:    BP 134/84   Pulse 72   Temp 98.7 F (37.1 C) (Oral)   Ht 5\' 4"  (1.626 m)   Wt 152 lb 12.8 oz (69.3 kg)   BMI 26.23 kg/m   Wt Readings from Last 3 Encounters:  11/11/17 152 lb 12.8 oz (69.3 kg)  11/07/17 152 lb 3.2 oz (69 kg)  10/29/17 152 lb 12.8 oz (69.3 kg)    Gen: NAD, alert, cooperative with exam, NCAT EYES: EOMI, no conjunctival injection, or no icterus CV: NRRR, normal S1/S2, no murmur, distal pulses 2+ b/l Resp: CTABL, no wheezes, normal WOB Abd: +BS, soft, NTND. no guarding or organomegaly Ext: No edema, warm Neuro: Alert and oriented, strength equal b/l UE and LE, coordination grossly normal Skin: no rash  Assessment & Plan:  [name] was seen today for headache.  Diagnoses and  all orders for this visit:  Occipital neuralgia of left side Some improvement on gabapentin taken once a day.  Will increase to twice a day.  If ongoing symptoms, okay to take 2 capsules in the morning 1 capsule at night. -     gabapentin (NEURONTIN) 100 MG capsule; 1 capsule in the morning, 1 capsule at night for 1 week.  Then take 2 capsules in the morning, 1 capsule at night  Uncomplicated asthma, unspecified asthma severity, unspecified whether persistent No recent exacerbations.  Has follow-up with pulmonology. -     albuterol (PROVENTIL HFA;VENTOLIN HFA) 108 (90 Base) MCG/ACT inhaler; Inhale 2 puffs into the lungs every 6 (six) hours as needed for wheezing or shortness of breath. -     budesonide-formoterol (SYMBICORT) 160-4.5 MCG/ACT inhaler; Inhale 2 puffs into the lungs 2 (two) times daily.   Follow up plan: Return in about 2 months (around 01/12/2018). 01/14/2018, MD [facility]

## 2017-11-13 ENCOUNTER — Other Ambulatory Visit: Payer: Self-pay | Admitting: Pediatrics

## 2017-11-13 ENCOUNTER — Telehealth: Payer: Self-pay | Admitting: Pulmonary Disease

## 2017-11-13 DIAGNOSIS — F411 Generalized anxiety disorder: Secondary | ICD-10-CM

## 2017-11-13 NOTE — Telephone Encounter (Signed)
Spoke with pt's daughter, [name]. She had a few questions about the pt's appointment with Dr. Isaiah Serge on 11/07/17. I have answered her questions to the best of my ability. Nothing further was needed.

## 2017-11-13 NOTE — Telephone Encounter (Signed)
Patient daughter returning call - she can be reached at [phone]

## 2017-11-13 NOTE — Telephone Encounter (Signed)
Attempted to call Renard Hamper at [phone]; pt's daughter I did not receive an answer at time of call. I have left a voicemail message for pt to return call. X1

## 2017-11-14 ENCOUNTER — Other Ambulatory Visit: Payer: Self-pay | Admitting: Pediatrics

## 2017-11-14 ENCOUNTER — Ambulatory Visit: Payer: Medicare Other | Admitting: Pediatrics

## 2017-11-14 DIAGNOSIS — F411 Generalized anxiety disorder: Secondary | ICD-10-CM

## 2017-11-16 ENCOUNTER — Ambulatory Visit (INDEPENDENT_AMBULATORY_CARE_PROVIDER_SITE_OTHER): Payer: Medicare Other | Admitting: Family Medicine

## 2017-11-16 VITALS — BP 120/65 | HR 65 | Temp 97.7°F | Ht 64.0 in | Wt 151.8 lb

## 2017-11-16 DIAGNOSIS — J45901 Unspecified asthma with (acute) exacerbation: Secondary | ICD-10-CM

## 2017-11-16 DIAGNOSIS — J4541 Moderate persistent asthma with (acute) exacerbation: Secondary | ICD-10-CM | POA: Diagnosis not present

## 2017-11-16 MED ORDER — ALBUTEROL SULFATE (2.5 MG/3ML) 0.083% IN NEBU: 2.5000 mg | INHALATION_SOLUTION | Freq: Four times a day (QID) | RESPIRATORY_TRACT | 1 refills | Status: DC | PRN

## 2017-11-16 MED ORDER — PREDNISONE 20 MG PO TABS: ORAL_TABLET | ORAL | 0 refills | Status: DC

## 2017-11-16 NOTE — Addendum Note (Signed)
Addended by: Elenora Gamma on: 11/16/2017 11:37 AM   Modules accepted: Orders

## 2017-11-16 NOTE — Progress Notes (Addendum)
   HPI  Patient presents today cough.  Patient claims she has had 2 to 3 days of wheezing, cough, shortness of breath.  She denies any fever, chills, sweats. She is tolerating food and fluids like usual by mouth.  She reports that she is taking Symbicort 2 puffs twice a day every day.  She also is using albuterol HFA as needed with some improvement.  PMH: Smoking status noted ROS: Per HPI  Objective: BP 120/65   Pulse 65   Temp 97.7 F (36.5 C) (Oral)   Ht 5\' 4"  (1.626 m)   Wt 151 lb 12.8 oz (68.9 kg)   BMI 26.06 kg/m  Gen: NAD, alert, cooperative with exam HEENT: NCAT CV: RRR, good S1/S2 Resp: Good air movement, wheezing throughout, nonlabored Ext: No edema, warm Neuro: Alert and oriented, No gross deficits  Assessment and plan:  #asthma Exacerbation Prednisone burst Nebulizer given in clinic with good improvement Return to clinic with any concerns Recommended clearance with PCP or pulmonology before surgery, she describes that it is not scheduled yet    Meds ordered this encounter  Medications  . predniSONE (DELTASONE) 20 MG tablet    Sig: 2 po at same time daily for 5 days    Dispense:  10 tablet    Refill:  0    , MD Murtis Sink [facility] 11/16/2017, 10:42 AM  Calling back in requesting nebulizer machine, she has checked with the pharmacy and this will be well covered by her insurance.  DME nebulizer machine prescription written, albuterol nebulizer solution written also.  11/18/2017, MD [facility] 11/16/2017, 11:37 AM

## 2017-11-19 ENCOUNTER — Telehealth (INDEPENDENT_AMBULATORY_CARE_PROVIDER_SITE_OTHER): Payer: Self-pay | Admitting: Orthopaedic Surgery

## 2017-11-19 NOTE — Telephone Encounter (Signed)
Have you received clearance?

## 2017-11-19 NOTE — Telephone Encounter (Signed)
Patient's daughter called wanting to know if you have received a fax from Dr. Isaiah Serge for surgical clearance.  Patient's daughter's CB#[phone] ([phone]), you are able to leave her a message if she does not answer.  Thank you.

## 2017-11-21 ENCOUNTER — Telehealth: Payer: Self-pay | Admitting: Pediatrics

## 2017-11-21 NOTE — Telephone Encounter (Signed)
Please review and advise.

## 2017-11-22 NOTE — Telephone Encounter (Signed)
Daughter calling again asking to speak with someone today. She is also wanting a nebulizer and solution that goes in it so she can having breathing treatments at home. Call work number if cant reach cell  [phone]

## 2017-11-22 NOTE — Telephone Encounter (Signed)
She got a prescription for nebulizer machine and albuterol solution to go with it at recent office visit 7/20.  Was she able to fill that?  Okay to send in again if needed.  Does she have a surgery scheduled?  Because of recent asthma exacerbation, we should see her back in clinic to make sure her breathing is back to baseline prior to surgery.  We can talk about increasing medicine for her headaches at that time as well.  Okay to schedule appointment with me, use acute spots if needed.

## 2017-11-26 ENCOUNTER — Emergency Department (HOSPITAL_COMMUNITY): Admit: 2017-11-26 | Payer: Medicare Other | Admitting: Cardiology

## 2017-11-26 ENCOUNTER — Encounter (HOSPITAL_COMMUNITY): Payer: Self-pay | Admitting: Emergency Medicine

## 2017-11-26 ENCOUNTER — Telehealth: Payer: Self-pay | Admitting: Pulmonary Disease

## 2017-11-26 ENCOUNTER — Inpatient Hospital Stay (HOSPITAL_COMMUNITY)
Admission: EM | Admit: 2017-11-26 | Discharge: 2017-11-28 | DRG: 190 | Disposition: A | Discharge status: Home / Self Care | Payer: Medicare Other | Attending: Family Medicine | Admitting: Family Medicine

## 2017-11-26 ENCOUNTER — Emergency Department (HOSPITAL_COMMUNITY): Payer: Medicare Other

## 2017-11-26 ENCOUNTER — Telehealth: Payer: Self-pay | Admitting: *Deleted

## 2017-11-26 ENCOUNTER — Encounter (HOSPITAL_COMMUNITY)
Admission: EM | Disposition: A | Discharge status: Home / Self Care | Payer: Self-pay | Source: Home / Self Care | Attending: Family Medicine

## 2017-11-26 ENCOUNTER — Other Ambulatory Visit: Payer: Self-pay | Admitting: Pediatrics

## 2017-11-26 ENCOUNTER — Telehealth: Payer: Self-pay | Admitting: Pediatrics

## 2017-11-26 DIAGNOSIS — Z9842 Cataract extraction status, left eye: Secondary | ICD-10-CM | POA: Diagnosis not present

## 2017-11-26 DIAGNOSIS — J9600 Acute respiratory failure, unspecified whether with hypoxia or hypercapnia: Secondary | ICD-10-CM | POA: Diagnosis present

## 2017-11-26 DIAGNOSIS — N182 Chronic kidney disease, stage 2 (mild): Secondary | ICD-10-CM | POA: Diagnosis present

## 2017-11-26 DIAGNOSIS — J440 Chronic obstructive pulmonary disease with acute lower respiratory infection: Secondary | ICD-10-CM | POA: Diagnosis present

## 2017-11-26 DIAGNOSIS — I429 Cardiomyopathy, unspecified: Secondary | ICD-10-CM | POA: Diagnosis present

## 2017-11-26 DIAGNOSIS — Z885 Allergy status to narcotic agent status: Secondary | ICD-10-CM

## 2017-11-26 DIAGNOSIS — Z961 Presence of intraocular lens: Secondary | ICD-10-CM | POA: Diagnosis present

## 2017-11-26 DIAGNOSIS — I130 Hypertensive heart and chronic kidney disease with heart failure and stage 1 through stage 4 chronic kidney disease, or unspecified chronic kidney disease: Secondary | ICD-10-CM | POA: Diagnosis present

## 2017-11-26 DIAGNOSIS — I5032 Chronic diastolic (congestive) heart failure: Secondary | ICD-10-CM | POA: Diagnosis present

## 2017-11-26 DIAGNOSIS — I6529 Occlusion and stenosis of unspecified carotid artery: Secondary | ICD-10-CM | POA: Diagnosis present

## 2017-11-26 DIAGNOSIS — K219 Gastro-esophageal reflux disease without esophagitis: Secondary | ICD-10-CM | POA: Diagnosis present

## 2017-11-26 DIAGNOSIS — J9602 Acute respiratory failure with hypercapnia: Secondary | ICD-10-CM | POA: Diagnosis not present

## 2017-11-26 DIAGNOSIS — J189 Pneumonia, unspecified organism: Secondary | ICD-10-CM | POA: Diagnosis present

## 2017-11-26 DIAGNOSIS — I447 Left bundle-branch block, unspecified: Secondary | ICD-10-CM | POA: Diagnosis present

## 2017-11-26 DIAGNOSIS — J9601 Acute respiratory failure with hypoxia: Secondary | ICD-10-CM | POA: Diagnosis present

## 2017-11-26 DIAGNOSIS — R413 Other amnesia: Secondary | ICD-10-CM | POA: Diagnosis present

## 2017-11-26 DIAGNOSIS — I10 Essential (primary) hypertension: Secondary | ICD-10-CM | POA: Diagnosis present

## 2017-11-26 DIAGNOSIS — M48061 Spinal stenosis, lumbar region without neurogenic claudication: Secondary | ICD-10-CM | POA: Diagnosis present

## 2017-11-26 DIAGNOSIS — F329 Major depressive disorder, single episode, unspecified: Secondary | ICD-10-CM | POA: Diagnosis present

## 2017-11-26 DIAGNOSIS — Z880 Allergy status to penicillin: Secondary | ICD-10-CM

## 2017-11-26 DIAGNOSIS — J441 Chronic obstructive pulmonary disease with (acute) exacerbation: Secondary | ICD-10-CM | POA: Diagnosis present

## 2017-11-26 DIAGNOSIS — F17210 Nicotine dependence, cigarettes, uncomplicated: Secondary | ICD-10-CM | POA: Diagnosis present

## 2017-11-26 DIAGNOSIS — Z79899 Other long term (current) drug therapy: Secondary | ICD-10-CM

## 2017-11-26 DIAGNOSIS — Z9841 Cataract extraction status, right eye: Secondary | ICD-10-CM

## 2017-11-26 DIAGNOSIS — Z888 Allergy status to other drugs, medicaments and biological substances status: Secondary | ICD-10-CM

## 2017-11-26 DIAGNOSIS — Z539 Procedure and treatment not carried out, unspecified reason: Secondary | ICD-10-CM | POA: Diagnosis present

## 2017-11-26 DIAGNOSIS — E782 Mixed hyperlipidemia: Secondary | ICD-10-CM | POA: Diagnosis present

## 2017-11-26 DIAGNOSIS — Z7952 Long term (current) use of systemic steroids: Secondary | ICD-10-CM

## 2017-11-26 DIAGNOSIS — J4521 Mild intermittent asthma with (acute) exacerbation: Secondary | ICD-10-CM | POA: Diagnosis not present

## 2017-11-26 DIAGNOSIS — M8580 Other specified disorders of bone density and structure, unspecified site: Secondary | ICD-10-CM | POA: Diagnosis present

## 2017-11-26 DIAGNOSIS — Z7982 Long term (current) use of aspirin: Secondary | ICD-10-CM

## 2017-11-26 DIAGNOSIS — J449 Chronic obstructive pulmonary disease, unspecified: Secondary | ICD-10-CM | POA: Insufficient documentation

## 2017-11-26 DIAGNOSIS — G629 Polyneuropathy, unspecified: Secondary | ICD-10-CM | POA: Diagnosis present

## 2017-11-26 DIAGNOSIS — Z96641 Presence of right artificial hip joint: Secondary | ICD-10-CM | POA: Diagnosis present

## 2017-11-26 DIAGNOSIS — I5030 Unspecified diastolic (congestive) heart failure: Secondary | ICD-10-CM | POA: Diagnosis not present

## 2017-11-26 DIAGNOSIS — N179 Acute kidney failure, unspecified: Secondary | ICD-10-CM | POA: Diagnosis present

## 2017-11-26 DIAGNOSIS — I5022 Chronic systolic (congestive) heart failure: Secondary | ICD-10-CM | POA: Diagnosis present

## 2017-11-26 DIAGNOSIS — Z8249 Family history of ischemic heart disease and other diseases of the circulatory system: Secondary | ICD-10-CM

## 2017-11-26 DIAGNOSIS — Z7951 Long term (current) use of inhaled steroids: Secondary | ICD-10-CM

## 2017-11-26 HISTORY — DX: Unspecified asthma, uncomplicated: J45.909

## 2017-11-26 HISTORY — DX: Pneumonia, unspecified organism: J18.9

## 2017-11-26 LAB — CBC WITH DIFFERENTIAL/PLATELET
ABS IMMATURE GRANULOCYTES: 0.1 10*3/uL (ref 0.0–0.1)
Abs Immature Granulocytes: 0 K/uL (ref 0.0–0.1)
BASOS ABS: 0.1 10*3/uL (ref 0.0–0.1)
BASOS PCT: 0 %
Basophils Absolute: 0 K/uL (ref 0.0–0.1)
Basophils Relative: 0 %
EOS ABS: 1.8 10*3/uL — AB (ref 0.0–0.7)
Eosinophils Absolute: 0.7 K/uL (ref 0.0–0.7)
Eosinophils Relative: 12 %
Eosinophils Relative: 6 %
HCT: 42.8 % (ref 36.0–46.0)
HCT: 41.6 % (ref 36.0–46.0)
Hemoglobin: 13.4 g/dL (ref 12.0–15.0)
Hemoglobin: 13.1 g/dL (ref 12.0–15.0)
IMMATURE GRANULOCYTES: 0 %
Immature Granulocytes: 0 %
Lymphocytes Relative: 16 %
Lymphocytes Relative: 9 %
Lymphs Abs: 2.4 10*3/uL (ref 0.7–4.0)
Lymphs Abs: 1.1 K/uL (ref 0.7–4.0)
MCH: 29.9 pg (ref 26.0–34.0)
MCH: 30.2 pg (ref 26.0–34.0)
MCHC: 31.3 g/dL (ref 30.0–36.0)
MCHC: 31.5 g/dL (ref 30.0–36.0)
MCV: 95.5 fL (ref 78.0–100.0)
MCV: 95.9 fL (ref 78.0–100.0)
MONOS PCT: 5 %
Monocytes Absolute: 0.8 10*3/uL (ref 0.1–1.0)
Monocytes Absolute: 0.2 K/uL (ref 0.1–1.0)
Monocytes Relative: 2 %
NEUTROS ABS: 10.2 10*3/uL — AB (ref 1.7–7.7)
NEUTROS PCT: 67 %
Neutro Abs: 10.1 K/uL — ABNORMAL HIGH (ref 1.7–7.7)
Neutrophils Relative %: 83 %
PLATELETS: 256 10*3/uL (ref 150–400)
Platelets: 262 K/uL (ref 150–400)
RBC: 4.48 MIL/uL (ref 3.87–5.11)
RBC: 4.34 MIL/uL (ref 3.87–5.11)
RDW: 14.6 % (ref 11.5–15.5)
RDW: 14.7 % (ref 11.5–15.5)
WBC: 15.3 10*3/uL — AB (ref 4.0–10.5)
WBC: 12.1 K/uL — ABNORMAL HIGH (ref 4.0–10.5)

## 2017-11-26 LAB — I-STAT ARTERIAL BLOOD GAS, ED
ACID-BASE DEFICIT: 5 mmol/L — AB (ref 0.0–2.0)
Acid-base deficit: 4 mmol/L — ABNORMAL HIGH (ref 0.0–2.0)
BICARBONATE: 23.5 mmol/L (ref 20.0–28.0)
Bicarbonate: 24 mmol/L (ref 20.0–28.0)
O2 SAT: 100 %
O2 SAT: 98 %
PCO2 ART: 52.2 mmHg — AB (ref 32.0–48.0)
PH ART: 7.219 — AB (ref 7.350–7.450)
PO2 ART: 231 mmHg — AB (ref 83.0–108.0)
PO2 ART: 115 mmHg — AB (ref 83.0–108.0)
Patient temperature: 96.6
Patient temperature: 96.6
TCO2: 25 mmol/L (ref 22–32)
TCO2: 26 mmol/L (ref 22–32)
pCO2 arterial: 56.8 mmHg — ABNORMAL HIGH (ref 32.0–48.0)
pH, Arterial: 7.264 — ABNORMAL LOW (ref 7.350–7.450)

## 2017-11-26 LAB — BASIC METABOLIC PANEL
ANION GAP: 7 (ref 5–15)
BUN: 21 mg/dL (ref 8–23)
CO2: 23 mmol/L (ref 22–32)
Calcium: 8.2 mg/dL — ABNORMAL LOW (ref 8.9–10.3)
Chloride: 111 mmol/L (ref 98–111)
Creatinine, Ser: 1.41 mg/dL — ABNORMAL HIGH (ref 0.44–1.00)
GFR, EST AFRICAN AMERICAN: 40 mL/min — AB (ref >60)
GFR, EST NON AFRICAN AMERICAN: 34 mL/min — AB (ref >60)
GLUCOSE: 154 mg/dL — AB (ref 70–99)
POTASSIUM: 4.5 mmol/L (ref 3.5–5.1)
SODIUM: 141 mmol/L (ref 135–145)

## 2017-11-26 LAB — COMPREHENSIVE METABOLIC PANEL WITH GFR
ALT: 21 U/L (ref 0–44)
AST: 24 U/L (ref 15–41)
Albumin: 3.4 g/dL — ABNORMAL LOW (ref 3.5–5.0)
Alkaline Phosphatase: 75 U/L (ref 38–126)
Anion gap: 10 (ref 5–15)
BUN: 24 mg/dL — ABNORMAL HIGH (ref 8–23)
CO2: 22 mmol/L (ref 22–32)
Calcium: 8.2 mg/dL — ABNORMAL LOW (ref 8.9–10.3)
Chloride: 109 mmol/L (ref 98–111)
Creatinine, Ser: 1.38 mg/dL — ABNORMAL HIGH (ref 0.44–1.00)
GFR calc Af Amer: 41 mL/min — ABNORMAL LOW
GFR calc non Af Amer: 35 mL/min — ABNORMAL LOW
Glucose, Bld: 130 mg/dL — ABNORMAL HIGH (ref 70–99)
Potassium: 5.1 mmol/L (ref 3.5–5.1)
Sodium: 141 mmol/L (ref 135–145)
Total Bilirubin: 0.3 mg/dL (ref 0.3–1.2)
Total Protein: 6 g/dL — ABNORMAL LOW (ref 6.5–8.1)

## 2017-11-26 LAB — RESPIRATORY PANEL BY PCR
Adenovirus: NOT DETECTED
BORDETELLA PERTUSSIS-RVPCR: NOT DETECTED
CHLAMYDOPHILA PNEUMONIAE-RVPPCR: NOT DETECTED
CORONAVIRUS HKU1-RVPPCR: NOT DETECTED
Coronavirus 229E: NOT DETECTED
Coronavirus NL63: NOT DETECTED
Coronavirus OC43: NOT DETECTED
INFLUENZA A-RVPPCR: NOT DETECTED
Influenza B: NOT DETECTED
Metapneumovirus: NOT DETECTED
Mycoplasma pneumoniae: NOT DETECTED
PARAINFLUENZA VIRUS 3-RVPPCR: NOT DETECTED
PARAINFLUENZA VIRUS 4-RVPPCR: NOT DETECTED
Parainfluenza Virus 1: NOT DETECTED
Parainfluenza Virus 2: NOT DETECTED
RHINOVIRUS / ENTEROVIRUS - RVPPCR: NOT DETECTED
Respiratory Syncytial Virus: NOT DETECTED

## 2017-11-26 LAB — STREP PNEUMONIAE URINARY ANTIGEN: Strep Pneumo Urinary Antigen: NEGATIVE

## 2017-11-26 LAB — TROPONIN I
Troponin I: 0.04 ng/mL
Troponin I: 0.05 ng/mL
Troponin I: 0.05 ng/mL

## 2017-11-26 LAB — I-STAT TROPONIN, ED: TROPONIN I, POC: 0.01 ng/mL (ref 0.00–0.08)

## 2017-11-26 LAB — PROCALCITONIN: Procalcitonin: <0.1 ng/mL

## 2017-11-26 LAB — BRAIN NATRIURETIC PEPTIDE: B NATRIURETIC PEPTIDE 5: 132.6 pg/mL — AB (ref 0.0–100.0)

## 2017-11-26 LAB — MAGNESIUM: Magnesium: 2.3 mg/dL (ref 1.7–2.4)

## 2017-11-26 LAB — TSH: TSH: 0.859 u[IU]/mL (ref 0.350–4.500)

## 2017-11-26 SURGERY — INVASIVE LAB ABORTED CASE

## 2017-11-26 MED ORDER — ALBUTEROL SULFATE (2.5 MG/3ML) 0.083% IN NEBU: 2.5000 mg | INHALATION_SOLUTION | RESPIRATORY_TRACT | Status: DC | PRN

## 2017-11-26 MED ORDER — IOPAMIDOL (ISOVUE-370) INJECTION 76%
INTRAVENOUS | Status: AC
  Filled 2017-11-26: qty 125

## 2017-11-26 MED ORDER — ONDANSETRON HCL 4 MG PO TABS: 4.0000 mg | ORAL_TABLET | Freq: Four times a day (QID) | ORAL | Status: DC | PRN

## 2017-11-26 MED ORDER — LORATADINE 10 MG PO TABS
10.0000 mg | ORAL_TABLET | Freq: Every day | ORAL | Status: DC
  Administered 2017-11-26 – 2017-11-28 (×3): 10 mg via ORAL
  Filled 2017-11-26 (×3): qty 1

## 2017-11-26 MED ORDER — ASPIRIN EC 81 MG PO TBEC
81.0000 mg | DELAYED_RELEASE_TABLET | Freq: Every day | ORAL | Status: DC
  Administered 2017-11-26 – 2017-11-28 (×3): 81 mg via ORAL
  Filled 2017-11-26 (×3): qty 1

## 2017-11-26 MED ORDER — IPRATROPIUM-ALBUTEROL 0.5-2.5 (3) MG/3ML IN SOLN
3.0000 mL | Freq: Once | RESPIRATORY_TRACT | Status: AC
  Administered 2017-11-26: 3 mL via RESPIRATORY_TRACT
  Filled 2017-11-26: qty 3

## 2017-11-26 MED ORDER — METHYLPREDNISOLONE SODIUM SUCC 40 MG IJ SOLR
40.0000 mg | Freq: Two times a day (BID) | INTRAMUSCULAR | Status: DC
  Administered 2017-11-26 – 2017-11-27 (×2): 40 mg via INTRAVENOUS
  Filled 2017-11-26 (×2): qty 1

## 2017-11-26 MED ORDER — NITROGLYCERIN 1 MG/10 ML FOR IR/CATH LAB
INTRA_ARTERIAL | Status: AC
  Filled 2017-11-26: qty 10

## 2017-11-26 MED ORDER — SODIUM CHLORIDE 0.9 % IV BOLUS
1000.0000 mL | Freq: Once | INTRAVENOUS | Status: AC
  Administered 2017-11-26: 1000 mL via INTRAVENOUS

## 2017-11-26 MED ORDER — ONDANSETRON HCL 4 MG/2ML IJ SOLN: 4.0000 mg | Freq: Four times a day (QID) | INTRAMUSCULAR | Status: DC | PRN

## 2017-11-26 MED ORDER — BUPROPION HCL ER (SR) 150 MG PO TB12
150.0000 mg | ORAL_TABLET | Freq: Two times a day (BID) | ORAL | Status: DC
  Administered 2017-11-26 – 2017-11-27 (×2): 150 mg via ORAL
  Filled 2017-11-26 (×2): qty 1

## 2017-11-26 MED ORDER — SODIUM CHLORIDE 0.9 % IV SOLN
1.0000 g | INTRAVENOUS | Status: DC
  Administered 2017-11-27 – 2017-11-28 (×2): 1 g via INTRAVENOUS
  Filled 2017-11-26 (×2): qty 10

## 2017-11-26 MED ORDER — BUDESONIDE 0.25 MG/2ML IN SUSP
0.2500 mg | Freq: Two times a day (BID) | RESPIRATORY_TRACT | Status: DC
  Administered 2017-11-26 – 2017-11-27 (×2): 0.25 mg via RESPIRATORY_TRACT
  Filled 2017-11-26 (×4): qty 2

## 2017-11-26 MED ORDER — ALBUTEROL SULFATE (2.5 MG/3ML) 0.083% IN NEBU
2.5000 mg | INHALATION_SOLUTION | RESPIRATORY_TRACT | Status: DC
  Administered 2017-11-26: 2.5 mg via RESPIRATORY_TRACT
  Filled 2017-11-26: qty 3

## 2017-11-26 MED ORDER — METHYLPREDNISOLONE SODIUM SUCC 125 MG IJ SOLR
125.0000 mg | Freq: Once | INTRAMUSCULAR | Status: AC
  Administered 2017-11-26: 125 mg via INTRAVENOUS
  Filled 2017-11-26: qty 2

## 2017-11-26 MED ORDER — PANTOPRAZOLE SODIUM 40 MG PO TBEC
40.0000 mg | DELAYED_RELEASE_TABLET | Freq: Two times a day (BID) | ORAL | Status: DC
  Administered 2017-11-26 – 2017-11-27 (×3): 40 mg via ORAL
  Filled 2017-11-26 (×3): qty 1

## 2017-11-26 MED ORDER — IPRATROPIUM BROMIDE 0.02 % IN SOLN
0.5000 mg | RESPIRATORY_TRACT | Status: DC
  Administered 2017-11-26: 0.5 mg via RESPIRATORY_TRACT
  Filled 2017-11-26: qty 2.5

## 2017-11-26 MED ORDER — CARVEDILOL 6.25 MG PO TABS
6.2500 mg | ORAL_TABLET | Freq: Two times a day (BID) | ORAL | Status: DC
  Administered 2017-11-26 – 2017-11-28 (×4): 6.25 mg via ORAL
  Filled 2017-11-26 (×4): qty 1

## 2017-11-26 MED ORDER — SODIUM CHLORIDE 0.9 % IV SOLN
1.0000 g | Freq: Once | INTRAVENOUS | Status: AC
  Administered 2017-11-26: 1 g via INTRAVENOUS
  Filled 2017-11-26: qty 10

## 2017-11-26 MED ORDER — SODIUM CHLORIDE 0.9 % IV SOLN
500.0000 mg | INTRAVENOUS | Status: DC
  Administered 2017-11-27 – 2017-11-28 (×2): 500 mg via INTRAVENOUS
  Filled 2017-11-26 (×2): qty 500

## 2017-11-26 MED ORDER — ACETAMINOPHEN 650 MG RE SUPP: 650.0000 mg | Freq: Four times a day (QID) | RECTAL | Status: DC | PRN

## 2017-11-26 MED ORDER — HYDRALAZINE HCL 20 MG/ML IJ SOLN: 10.0000 mg | INTRAMUSCULAR | Status: DC | PRN

## 2017-11-26 MED ORDER — ENSURE ENLIVE PO LIQD: 237.0000 mL | Freq: Two times a day (BID) | ORAL | Status: DC

## 2017-11-26 MED ORDER — AZELASTINE HCL 0.1 % NA SOLN
2.0000 | Freq: Two times a day (BID) | NASAL | Status: DC
  Administered 2017-11-26 – 2017-11-28 (×3): 2 via NASAL
  Filled 2017-11-26 (×2): qty 30

## 2017-11-26 MED ORDER — SODIUM CHLORIDE 0.9 % IV SOLN
500.0000 mg | Freq: Once | INTRAVENOUS | Status: AC
  Administered 2017-11-26: 500 mg via INTRAVENOUS
  Filled 2017-11-26: qty 500

## 2017-11-26 MED ORDER — IPRATROPIUM-ALBUTEROL 0.5-2.5 (3) MG/3ML IN SOLN
3.0000 mL | RESPIRATORY_TRACT | Status: DC
  Administered 2017-11-26: 3 mL via RESPIRATORY_TRACT
  Filled 2017-11-26: qty 3

## 2017-11-26 MED ORDER — VERAPAMIL HCL 2.5 MG/ML IV SOLN
INTRAVENOUS | Status: AC
Start: 2017-11-26 — End: ?
  Filled 2017-11-26: qty 2

## 2017-11-26 MED ORDER — GABAPENTIN 100 MG PO CAPS
100.0000 mg | ORAL_CAPSULE | Freq: Two times a day (BID) | ORAL | Status: DC
  Administered 2017-11-26 – 2017-11-28 (×5): 100 mg via ORAL
  Filled 2017-11-26 (×5): qty 1

## 2017-11-26 MED ORDER — LIDOCAINE HCL (PF) 1 % IJ SOLN
INTRAMUSCULAR | Status: AC
  Filled 2017-11-26: qty 30

## 2017-11-26 MED ORDER — ACETAMINOPHEN 325 MG PO TABS
650.0000 mg | ORAL_TABLET | Freq: Four times a day (QID) | ORAL | Status: DC | PRN
Start: 2017-11-26 — End: 2017-11-28
  Administered 2017-11-26: 650 mg via ORAL
  Filled 2017-11-26: qty 2

## 2017-11-26 MED ORDER — ENOXAPARIN SODIUM 30 MG/0.3ML ~~LOC~~ SOLN
30.0000 mg | SUBCUTANEOUS | Status: DC
  Filled 2017-11-26: qty 0.3

## 2017-11-26 MED ORDER — IPRATROPIUM-ALBUTEROL 0.5-2.5 (3) MG/3ML IN SOLN
3.0000 mL | Freq: Four times a day (QID) | RESPIRATORY_TRACT | Status: DC
  Administered 2017-11-26: 3 mL via RESPIRATORY_TRACT
  Filled 2017-11-26 (×2): qty 3

## 2017-11-26 SURGICAL SUPPLY — 8 items
DEVICE RAD COMP TR BAND LRG (VASCULAR PRODUCTS) ×4 IMPLANT
KIT ENCORE 26 ADVANTAGE (KITS) ×4 IMPLANT
KIT HEART LEFT (KITS) ×4 IMPLANT
KIT HEMO VALVE WATCHDOG (MISCELLANEOUS) ×4 IMPLANT
PACK CARDIAC CATHETERIZATION (CUSTOM PROCEDURE TRAY) ×4 IMPLANT
SYR MEDRAD MARK V 150ML (SYRINGE) ×4 IMPLANT
TRANSDUCER W/STOPCOCK (MISCELLANEOUS) ×4 IMPLANT
TUBING CIL FLEX 10 FLL-RA (TUBING) ×4 IMPLANT

## 2017-11-26 NOTE — ED Notes (Signed)
Please call the daughter Arletha Grippe, [phone] Elizabeth Sauer (daughter)

## 2017-11-26 NOTE — ED Notes (Signed)
pts daughter updated on plan of care, verbal permission given by pt to update her family on plan of care, pts daughter Otho Perl requesting to be notified when a room is assigned, [phone] phone number for pts daughter [name] in [place]

## 2017-11-26 NOTE — ED Provider Notes (Signed)
[facility] EMERGENCY DEPARTMENT Provider Note   CSN: [id number] Arrival date & time: 11/26/17  2919     History   Chief Complaint Chief Complaint  Patient presents with  . Code STEMI    HPI [name] is a 83 y.o. female.  HPI  This is an 83 year old female who presented initially by [facility] with concerns for possible STEMI.  They were called out for respiratory distress.  Noted oxygen saturations per report to be anywhere from 60 to 80%.  She was placed on CPAP.  She at that time reported chest pressure.  EKG was obtained.  It was reading STEMI.  Patient was given nitro.    She feels much better with CPAP.  Denies any recent fever or cough.  Denies leg swelling.  Currently rates her chest pain at 3 out of 10.  Level 5 caveat for acuity of condition  Cardiology is at the bedside.  EKG reviewed.  Patient has a history of a left bundle branch block which is what is currently on her EKG.  Code STEMI was deactivated.  Past Medical History:  Diagnosis Date  . Allergic rhinitis   . Anxiety   . Arthritis   . Cataract    had eye surgery  . Depression   . Elevated troponin 04/05/2016  . Essential hypertension, benign   . GERD (gastroesophageal reflux disease)   . History of blood transfusion   . Left bundle branch block   . Memory loss   . Mixed hyperlipidemia   . Occipital neuralgia of left side   . Osteopenia   . 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 Urinary urgency     Patient Active Problem List   Diagnosis Date Noted  . Acute respiratory failure with hypoxia (HCC) 11/26/2017  . Biceps tendinosis of right shoulder 08/04/2017  . Pre-operative cardiovascular examination 08/01/2017  . Carotid artery disease (HCC) 07/03/2017  . DOE (dyspnea on exertion) 07/03/2017  . Chronic right shoulder pain 12/27/2016  . Constipation 04/11/2016  . Chronic diastolic heart failure (HCC) 04/07/2016  . Hyperglycemia  04/05/2016  . Syncope 11/15/2015  . Nausea and vomiting 11/15/2015  . Dehydration 10/05/2015  . UTI (lower urinary tract infection) 10/05/2015  . Mild dementia 09/13/2015  . Memory problem 05/07/2015  . Neck pain 04/06/2015  . Insomnia 12/10/2014  . BMI 28.0-28.9,adult 12/10/2014  . Osteopenia 05/19/2014  . GAD (generalized anxiety disorder) 01/06/2014  . Depression 01/06/2014  . GERD (gastroesophageal reflux disease) 01/06/2014  . Vitamin D deficiency 01/06/2014  . Overactive bladder 01/06/2014  . Secondary cardiomyopathy (HCC) 10/06/2013  . Left bundle branch block 10/06/2013  . Dizziness 10/04/2013  . Osteoarthritis of right hip 03/14/2013  . Cough 02/01/2011  . Reflux 02/01/2011  . Allergic rhinitis 12/07/2008  . Chronic cough 12/07/2008  . Hepatitis B virus infection 08/16/2008  . Hyperlipidemia 08/16/2008  . Essential hypertension 08/16/2008  . Primary osteoarthritis, right shoulder 08/16/2008  . DISC DEGENERATION 07/24/2007    Past Surgical History:  Procedure Laterality Date  . APPENDECTOMY    . BACK SURGERY    . CARDIAC CATHETERIZATION  2010   Normal coronaries  . CATARACT EXTRACTION W/PHACO  04/16/2011   Procedure: CATARACT EXTRACTION PHACO AND INTRAOCULAR LENS PLACEMENT (IOC);  Surgeon: Susa Simmonds;  Location: [facility];  Service: Ophthalmology;  Laterality: Left;  CDE=11.35  . CATARACT EXTRACTION W/PHACO  01/28/2012   Procedure: CATARACT EXTRACTION PHACO AND INTRAOCULAR LENS PLACEMENT (  IOC);  Surgeon: Susa Simmonds, MD;  Location: [facility];  Service: Ophthalmology;  Laterality: Right;  CDI:8.15  . CYSTO WITH HYDRODISTENSION  05/29/2012   Procedure: CYSTOSCOPY/HYDRODISTENSION;  Surgeon: Martina Sinner, MD;  Location[facility];  Service: Urology;  Laterality: N/A;  INSTILLATION OF MARCAINE AND PYRIDIUM   . DILATION AND CURETTAGE OF UTERUS  ?<hysterectomy"  . HAMMER TOE SURGERY Bilateral 11/22/11   [facility], [name]  . JOINT REPLACEMENT    .  LAPAROSCOPIC CHOLECYSTECTOMY  1991  . LUMBAR LAMINECTOMY  2011  . LUMBAR LAMINECTOMY/DECOMPRESSION MICRODISCECTOMY Left 06/06/2012   Procedure: LUMBAR LAMINECTOMY/DECOMPRESSION MICRODISCECTOMY 1 LEVEL;  Surgeon: Maeola Harman, MD;  Location: [facility];  Service: Neurosurgery;  Laterality: Left;  Left Lumbar two-three Laminectomy for resection of synovial cyst  . SHOULDER OPEN ROTATOR CUFF REPAIR Right 2011  . TONSILLECTOMY    . TOTAL ABDOMINAL HYSTERECTOMY    . TOTAL HIP ARTHROPLASTY Right 03/13/2013   Procedure: TOTAL HIP ARTHROPLASTY;  Surgeon: Nestor Lewandowsky, MD;  Location: [facility];  Service: Orthopedics;  Laterality: Right;     OB History    Gravida  5   Para  5   Term  5   Preterm      AB      Living  4     SAB      TAB      Ectopic      Multiple      Live Births               Home Medications    Prior to Admission medications   Medication Sig Start Date End Date Taking? Authorizing Provider  albuterol (PROVENTIL HFA;VENTOLIN HFA) 108 (90 Base) MCG/ACT inhaler Inhale 2 puffs into the lungs every 6 (six) hours as needed for wheezing or shortness of breath. 11/11/17   [name], MD  albuterol (PROVENTIL) (2.5 MG/3ML) 0.083% nebulizer solution Take 3 mLs (2.5 mg total) by nebulization every 6 (six) hours as needed for wheezing or shortness of breath. 11/16/17   Elenora Gamma, MD  aspirin EC 81 MG tablet Take 81 mg by mouth daily.    [provider]  budesonide-formoterol (SYMBICORT) 160-4.5 MCG/ACT inhaler Inhale 2 puffs into the lungs 2 (two) times daily. 11/11/17   [name], MD  buPROPion (WELLBUTRIN SR) 150 MG 12 hr tablet TAKE (1) TABLET TWICE A DAY. 10/30/17   [name], MD  carvedilol (COREG) 6.25 MG tablet Take 6.25 mg by mouth 2 (two) times daily with a meal.    [provider]  DYMISTA 137-50 MCG/ACT SUSP One spray per nostril twice a day 08/06/16   [name], MD  escitalopram (LEXAPRO) 10 MG tablet Take 1 tablet  (10 mg total) by mouth daily. 11/15/17   [name], MD  esomeprazole (NEXIUM) 40 MG capsule Take 1 capsule (40 mg total) by mouth daily before breakfast. 07/02/17   [name], MD  furosemide (LASIX) 20 MG tablet Take 1 tablet (20 mg total) by mouth daily. Pt to take 40 mg for 2 days then decrease to 20 mg daily 07/04/17 10/02/17  Rollene Rotunda, MD  gabapentin (NEURONTIN) 100 MG capsule 1 capsule in the morning, 1 capsule at night for 1 week.  Then take 2 capsules in the morning, 1 capsule at night 11/11/17   [name], MD  loratadine (CLARITIN) 10 MG tablet Take 1 tablet (10 mg total) by mouth daily. 08/28/17  [name], MD  losartan (COZAAR) 50 MG tablet Take 1/2 tablet (25 mg total) by mouth daily. 08/28/17   [name], MD  Multiple Vitamins-Minerals (CENTRUM SILVER PO) Take 1 tablet by mouth daily.    [provider]  potassium chloride SA (K-DUR,KLOR-CON) 20 MEQ tablet Take 1 tablet (20 mEq total) by mouth daily. 07/04/17   Rollene Rotunda, MD  pravastatin (PRAVACHOL) 80 MG tablet Take 1 tablet (80 mg total) by mouth daily. 08/07/17   Rollene Rotunda, MD  predniSONE (DELTASONE) 20 MG tablet 2 po at same time daily for 5 days 11/16/17   Elenora Gamma, MD  raloxifene (EVISTA) 60 MG tablet Take 60 mg by mouth daily.    [provider]    Family History Family History  Problem Relation Age of Onset  . Diabetes Father   . Diabetes Brother   . Cancer Brother 94       colon  . Diabetes Brother   . Diabetes Brother   . Hypertension Sister   . Diabetes Sister   . Diabetes Sister   . Diabetes Other   . Allergies Other   . Diabetes Son   . Colon cancer Neg Hx   . Anesthesia problems Neg Hx   . Hypotension Neg Hx   . Malignant hyperthermia Neg Hx   . Pseudochol deficiency Neg Hx     Social History Social History   Tobacco Use  . Smoking status: Current Some Day Smoker    Packs/day: 0.10    Years: 60.00    Pack years: 6.00    Types:  Cigarettes  . Smokeless tobacco: Never Used  . Tobacco comment: Smokes a cigarette maybe once a week at the most when she feel anxious  Substance Use Topics  . Alcohol use: No    Alcohol/week: 0.0 oz  . Drug use: No     Allergies   Lisinopril-hydrochlorothiazide; Aricept [donepezil hcl]; Codeine; Namenda [memantine hcl]; and Penicillins   Review of Systems Review of Systems  Constitutional: Negative for fever.  Respiratory: Positive for chest tightness and shortness of breath. Negative for cough.   Cardiovascular: Positive for chest pain.  Gastrointestinal: Negative for abdominal pain, nausea and vomiting.  Genitourinary: Negative for dysuria.  All other systems reviewed and are negative.    Physical Exam Updated Vital Signs BP 113/80   Pulse 82   Temp (!) 96.6 F (35.9 C) (Tympanic)   Resp (!) 21   SpO2 100%   Physical Exam  Constitutional: She is oriented to person, place, and time.  Elderly, nontoxic-appearing, CPAP in place  HENT:  Head: Normocephalic and atraumatic.  Eyes: Pupils are equal, round, and reactive to light.  Neck: Neck supple.  Cardiovascular: Normal rate, regular rhythm and normal heart sounds.  No murmur heard. Pulmonary/Chest: Effort normal. No respiratory distress. She has wheezes.  Tight, poor air movement, occasional wheeze  Abdominal: Soft. Bowel sounds are normal. There is no tenderness.  Musculoskeletal: She exhibits edema.  Neurological: She is alert and oriented to person, place, and time.  Skin: Skin is warm and dry.  Psychiatric: She has a normal mood and affect.  Nursing note and vitals reviewed.    ED Treatments / Results  Labs (all labs ordered are listed, but only abnormal results are displayed) Labs Reviewed  CBC WITH DIFFERENTIAL/PLATELET - Abnormal; Notable for the following components:      Result Value   WBC 15.3 (*)    Neutro Abs 10.2 (*)  Eosinophils Absolute 1.8 (*)    All other components within normal limits   BASIC METABOLIC PANEL - Abnormal; Notable for the following components:   Glucose, Bld 154 (*)    Creatinine, Ser 1.41 (*)    Calcium 8.2 (*)    GFR calc non Af Amer 34 (*)    GFR calc Af Amer 40 (*)    All other components within normal limits  BRAIN NATRIURETIC PEPTIDE - Abnormal; Notable for the following components:   B Natriuretic Peptide 132.6 (*)    All other components within normal limits  I-STAT ARTERIAL BLOOD GAS, ED - Abnormal; Notable for the following components:   pH, Arterial 7.219 (*)    pCO2 arterial 56.8 (*)    pO2, Arterial 231.0 (*)    Acid-base deficit 5.0 (*)    All other components within normal limits  CULTURE, BLOOD (ROUTINE X 2)  CULTURE, BLOOD (ROUTINE X 2)  I-STAT TROPONIN, ED    EKG EKG Interpretation  Date/Time:  Tuesday November 26 2017 03:40:56 EDT Ventricular Rate:  83 PR Interval:    QRS Duration: 140 QT Interval:  385 QTC Calculation: 453 R Axis:   78 Text Interpretation:  Sinus rhythm Left bundle branch block Confirmed by Ross Marcus ([id number]) on 11/26/2017 4:43:59 AM   Radiology Dg Chest Portable 1 View  Result Date: 11/26/2017 CLINICAL DATA:  Found down, shortness of breath. EXAM: PORTABLE CHEST 1 VIEW COMPARISON:  Chest radiograph August 23, 2017 FINDINGS: Cardiac silhouette is similarly enlarged. Tortuous calcified aorta. Mild interstitial prominence without pleural effusion or focal consolidation. No pneumothorax. Soft tissue planes and included osseous structures are unchanged. IMPRESSION: Mild interstitial prominence seen with atypical infection, less likely edema. Stable cardiomegaly Aortic Atherosclerosis (ICD10-I70.0). Electronically Signed   By: Awilda Metro M.D.   On: 11/26/2017 04:16    Procedures Procedures (including critical care time)   CRITICAL CARE Performed by: Shon Baton   Total critical care time: 45 minutes  Critical care time was exclusive of separately billable procedures and treating other  patients.  Critical care was necessary to treat or prevent imminent or life-threatening deterioration.  Critical care was time spent personally by me on the following activities: development of treatment plan with patient and/or surrogate as well as nursing, discussions with consultants, evaluation of patient's response to treatment, examination of patient, obtaining history from patient or surrogate, ordering and performing treatments and interventions, ordering and review of laboratory studies, ordering and review of radiographic studies, pulse oximetry and re-evaluation of patient's condition.  Medications Ordered in ED Medications  cefTRIAXone (ROCEPHIN) 1 g in sodium chloride 0.9 % 100 mL IVPB (has no administration in time range)  azithromycin (ZITHROMAX) 500 mg in sodium chloride 0.9 % 250 mL IVPB (has no administration in time range)  sodium chloride 0.9 % bolus 1,000 mL (has no administration in time range)  ipratropium-albuterol (DUONEB) 0.5-2.5 (3) MG/3ML nebulizer solution 3 mL (has no administration in time range)  methylPREDNISolone sodium succinate (SOLU-MEDROL) 125 mg/2 mL injection 125 mg (125 mg Intravenous Given 11/26/17 0351)     Initial Impression / Assessment and Plan / ED Course  I have reviewed the triage vital signs and the nursing notes.  Pertinent labs & imaging results that were available during my care of the patient were reviewed by me and considered in my medical decision making (see chart for details).     Patient presents with hypoxia and shortness of breath.  Initially presented as a code STEMI.  This was canceled.  She is having some chest pressure but this is likely related to her respiratory status.  She is comfortable and was transitioned to BiPAP.  Chest x-ray shows possible early atypical pneumonia.  Patient was treated for COPD with Solu-Medrol and DuoNeb.  She has remained comfortable in the ED.  She has no signs or symptoms of sepsis.  She was given  Rocephin and azithromycin for pneumonia coverage.  Given her respiratory status and distress, will admit for further management.  Final Clinical Impressions(s) / ED Diagnoses   Final diagnoses:  COPD exacerbation (HCC)  Community acquired pneumonia, unspecified laterality  Acute respiratory failure with hypoxia and hypercapnia [facility])    ED Discharge Orders    None       Wilkie Aye, Mayer Masker, MD 11/26/17 [phone]

## 2017-11-26 NOTE — Telephone Encounter (Signed)
Called pt's daughter Marcelino Duster who wanted to know about pt's last OV with Dr. Isaiah Serge on 7/11.  Stated to [place] at that OV, Dr. Isaiah Serge stated pt's lungs were clear and she was cleared to have the shoulder surgery.   Per Marcelino Duster, pt had to call EMS today, 7/30 as she was struggling to breathe and was taken to [facility].  Word was just gotten to both pt's daughters [name] and Marcelino Duster that pt has pna.  Pt is currently scheduled for a f/u appt with Dr. Isaiah Serge 8/29 having a PFT at 12pm and an OV at 2pm. Dr. Isaiah Serge, please advise if it would be okay to keep these appts or if we should change pt's appts?

## 2017-11-26 NOTE — Progress Notes (Signed)
RT Note:  Patient came into ED with possible STEMI and SOB, on EMT Bi-pap machine.  RT placed patient on hospital machine on NIV, patient is tolerating well and resting comfortably.

## 2017-11-26 NOTE — Progress Notes (Signed)
[place] TEAM 1 - Stepdown/ICU TEAM  CAPITOLA LADSON  CWC:[id number] DOB: Dec 08, 1934 DOA: 11/26/2017 PCP: [name], MD    Brief Narrative:  83 y.o. female with history of COPD/asthma, CHF, and hypertension who presented to the ER with 3 days of worsening shortness of breath.    In the ED there was concern for ST elevation MI but Cardiology on-call was consulted and felt the patient's EKG was most c/w chronic LBBB.  Due to the shortness of breath and acute hypoxia patient was placed on BiPAP.  Initial ABG showed pH of 7.21 with PCO2 of 56 which improved after 1 hour to 7.26 with a PCO2 of 52.  Chest x-ray noted possibility of infiltrates for which patient was placed on antibiotics.  Patient also was given nebulizer treatment and IV steroids.  Subjective: Pt is seen for a f/u visit.  She is on a [place] at the time of my visit, and much improved.    Assessment & Plan:  Acute hypoxic and hypercapneic respiratory failure - COPD exacerbation  Possible pneumonia v/s viral pneumonitis on empiric antibiotics  Chronic diastolic CHF EF March 20 50-55% with grade 1 diastolic dysfunction  Acute renal failure  Recent Labs  Lab 11/26/17 0342 11/26/17 0613  CREATININE 1.41* 1.38*    Hypertension   Chronic LBBB - Chest tightnesss.  Neuropathy  DVT prophylaxis: lovenox  Code Status: FULL CODE Family Communication: no family present at time of exam  Disposition Plan: tele bed   Consultants:  PCCM  Procedures: none  Antimicrobials:  Rocephin 7/29 > Azithro 7/29 >  Objective: Blood pressure 124/67, pulse 62, temperature (!) 96.6 F (35.9 C), temperature source Tympanic, resp. rate 17, SpO2 99 %.  Intake/Output Summary (Last 24 hours) at 11/26/2017 1444 Last data filed at 11/26/2017 1128 Gross per 24 hour  Intake 1000 ml  Output -  Net 1000 ml   There were no vitals filed for this visit.  Examination: Pt was seen for a f/u visit.    CBC: Recent Labs  Lab  11/26/17 0342 11/26/17 0613  WBC 15.3* 12.1*  NEUTROABS 10.2* 10.1*  HGB 13.4 13.1  HCT 42.8 41.6  MCV 95.5 95.9  PLT 256 262   Basic Metabolic Panel: Recent Labs  Lab 11/26/17 0342 11/26/17 0613  NA 141 141  K 4.5 5.1  CL 111 109  CO2 23 22  GLUCOSE 154* 130*  BUN 21 24*  CREATININE 1.41* 1.38*  CALCIUM 8.2* 8.2*  MG  --  2.3   GFR: Estimated Creatinine Clearance: 31 mL/min (A) (by C-G formula based on SCr of 1.38 mg/dL (H)).  Liver Function Tests: Recent Labs  Lab 11/26/17 0613  AST 24  ALT 21  ALKPHOS 75  BILITOT 0.3  PROT 6.0*  ALBUMIN 3.4*    Cardiac Enzymes: Recent Labs  Lab 11/26/17 0613 11/26/17 1113  TROPONINI 0.04* 0.05*    HbA1C: Hgb A1c MFr Bld  Date/Time Value Ref Range Status  10/04/2013 05:17 PM 5.8 (H) <5.7 % Final    Comment:    (NOTE)                                                                       According to the  ADA Clinical Practice Recommendations for 2011, when HbA1c is used as a screening test:  >=6.5%   Diagnostic of Diabetes Mellitus           (if abnormal result is confirmed) 5.7-6.4%   Increased risk of developing Diabetes Mellitus References:Diagnosis and Classification of Diabetes Mellitus,Diabetes Care,2011,34(Suppl 1):S62-S69 and Standards of Medical Care in         Diabetes - 2011,Diabetes Care,2011,34 (Suppl 1):S11-S61.    Scheduled Meds: . aspirin EC  81 mg Oral Daily  . azelastine  2 spray Each Nare BID  . budesonide (PULMICORT) nebulizer solution  0.25 mg Nebulization BID  . enoxaparin (LOVENOX) injection  30 mg Subcutaneous Q24H  . ipratropium-albuterol  3 mL Nebulization Q4H  . loratadine  10 mg Oral Daily  . methylPREDNISolone (SOLU-MEDROL) injection  40 mg Intravenous Q12H  . pantoprazole  40 mg Oral BID     LOS: 0 days   Time spent: No Charge  Lonia Blood, MD [facility] Office  [phone] Pager - Text Page per Amion as per below:  On-Call/Text Page:      [email or web address]       password [email or web address]  If 7PM-7AM, please contact night-coverage [email or web address] Password [facility] 11/26/2017, 2:44 PM

## 2017-11-26 NOTE — Consult Note (Addendum)
PULMONARY / CRITICAL CARE MEDICINE   Name: [name] MRN: [id number] DOB: [birth year]    ADMISSION DATE:  11/26/2017 CONSULTATION DATE:  7/30  REFERRING MD:  Sharon Seller   CHIEF COMPLAINT:  Acute respiratory failure   HISTORY OF PRESENT ILLNESS:   This is an 83 year old female with a significant medical history of: Asthma, diagnosed in 2013 with a positive methacholine challenge test, as well as PFTs showing mild obstruction.  She is an active smoker.  Was just recently seen in our office for preop clearance for right shoulder surgery on 7/11.  She also has a history of: HTN, diastolic heart failure, left ventricular hypertrophy,And left bundle branch block.  Presented to the emergency room on 7/30 with approximately 3 to 5-day history of progressive shortness of breath, chest tightness with cough particularly, and cough productive of yellow sputum.  She denied sick exposure, denies chest pain, palpitations, lightheadedness, nausea, vomiting, or lower extremity edema.  She has been taking her rescue bronchodilators without much improvement, shortness of breath progressed from shortness of breath with exertion to shortness of breath at rest and therefore she called EMS.  On EMS arrival her oxygen saturations were noted to be anywhere from 60 to 80%, she was placed on CPAP for transport.  Initially there was concern about ST elevation, however further twelve-lead analysis demonstrated this was her left bundle branch block.  Upon arrival to the emergency room she was transferred from CPAP to BiPAP.  Initial diagnostic evaluation demonstrated the following: White blood cell count of 15.3, BNP of 132.6, arterial blood gas with pH of 7.22, PCO2 of 57, PO2 of 231 with bicarbonate of 23.5.  Initial troponin was negative, initial creatinine 1.41.  Chest x-ray obtained in the emergency room demonstrated: Right greater than left interstitial airspace disease, cardiomegaly, and aortic arterial sclerosis.   Treatment to date has included BiPAP, systemic steroids, empiric antibiotics, and bronchodilators.  Upon critical care arrival patient states she is feeling better, resting comfortably on noninvasive positive pressure ventilation.  PAST MEDICAL HISTORY :  She  has a past medical history of Allergic rhinitis, Anxiety, Arthritis, Cataract, Depression, Elevated troponin (04/05/2016), Essential hypertension, benign, GERD (gastroesophageal reflux disease), History of blood transfusion, Left bundle branch block, Memory loss, Mixed hyperlipidemia, Occipital neuralgia of left side, Osteopenia, Secondary cardiomyopathy (HCC), and Urinary urgency.  PAST SURGICAL HISTORY: She  has a past surgical history that includes Laparoscopic cholecystectomy (1991); Tonsillectomy; Appendectomy; Back surgery; Shoulder open rotator cuff repair (Right, 2011); Cataract extraction w/PHACO (04/16/2011); Hammer toe surgery (Bilateral, 11/22/11); Cataract extraction w/PHACO (01/28/2012); cysto with hydrodistension (05/29/2012); Lumbar laminectomy/decompression microdiscectomy (Left, 06/06/2012); Total hip arthroplasty (Right, 03/13/2013); Total abdominal hysterectomy; Lumbar laminectomy (2011); Joint replacement; Cardiac catheterization (2010); and Dilation and curettage of uterus (?<hysterectomy").  Allergies  Allergen Reactions  . Lisinopril-Hydrochlorothiazide Other (See Comments)    Lip swelling and cough. Stopped by ENT. Patient is on Losartan without any issues   . Aricept [Donepezil Hcl]     Nausea and vomiting  . Codeine Itching and Swelling    eye irritation (prescribed percocet)  . Namenda [Memantine Hcl]     Nausea and vomiting  . Penicillins Itching and Swelling    Has patient had a PCN reaction causing immediate rash, facial/tongue/throat swelling, SOB or lightheadedness with hypotension: Yes Has patient had a PCN reaction causing severe rash involving mucus membranes or skin necrosis: No Has patient had a PCN reaction  that required hospitalization No Has patient had a PCN reaction occurring within the last  10 years: No If all of the above answers are "NO", then may proceed with Cephalosporin use. eye irritation    No current facility-administered medications on file prior to encounter.    Current Outpatient Medications on File Prior to Encounter  Medication Sig  . albuterol (PROVENTIL HFA;VENTOLIN HFA) 108 (90 Base) MCG/ACT inhaler Inhale 2 puffs into the lungs every 6 (six) hours as needed for wheezing or shortness of breath.  Marland Kitchen albuterol (PROVENTIL) (2.5 MG/3ML) 0.083% nebulizer solution Take 3 mLs (2.5 mg total) by nebulization every 6 (six) hours as needed for wheezing or shortness of breath.  Marland Kitchen aspirin EC 81 MG tablet Take 81 mg by mouth daily.  . budesonide-formoterol (SYMBICORT) 160-4.5 MCG/ACT inhaler Inhale 2 puffs into the lungs 2 (two) times daily.  Marland Kitchen buPROPion (WELLBUTRIN SR) 150 MG 12 hr tablet TAKE (1) TABLET TWICE A DAY.  . carvedilol (COREG) 6.25 MG tablet Take 6.25 mg by mouth 2 (two) times daily with a meal.  . DYMISTA 137-50 MCG/ACT SUSP One spray per nostril twice a day  . escitalopram (LEXAPRO) 10 MG tablet Take 1 tablet (10 mg total) by mouth daily.  Marland Kitchen esomeprazole (NEXIUM) 40 MG capsule Take 1 capsule (40 mg total) by mouth daily before breakfast.  . furosemide (LASIX) 20 MG tablet Take 1 tablet (20 mg total) by mouth daily. Pt to take 40 mg for 2 days then decrease to 20 mg daily  . gabapentin (NEURONTIN) 100 MG capsule 1 capsule in the morning, 1 capsule at night for 1 week.  Then take 2 capsules in the morning, 1 capsule at night  . loratadine (CLARITIN) 10 MG tablet Take 1 tablet (10 mg total) by mouth daily.  Marland Kitchen losartan (COZAAR) 50 MG tablet Take 1/2 tablet (25 mg total) by mouth daily.  . Multiple Vitamins-Minerals (CENTRUM SILVER PO) Take 1 tablet by mouth daily.  . potassium chloride SA (K-DUR,KLOR-CON) 20 MEQ tablet Take 1 tablet (20 mEq total) by mouth daily.  .  pravastatin (PRAVACHOL) 80 MG tablet Take 1 tablet (80 mg total) by mouth daily.  . predniSONE (DELTASONE) 20 MG tablet 2 po at same time daily for 5 days  . raloxifene (EVISTA) 60 MG tablet Take 60 mg by mouth daily.    FAMILY HISTORY:  Her family history includes Allergies in her other; Cancer (age of onset: 49) in her brother; Diabetes in her brother, brother, brother, father, other, sister, sister, and son; Hypertension in her sister. There is no history of Colon cancer, Anesthesia problems, Hypotension, Malignant hyperthermia, or Pseudochol deficiency.  SOCIAL HISTORY: She  reports that she has been smoking cigarettes.  She has a 6.00 pack-year smoking history. She has never used smokeless tobacco. She reports that she does not drink alcohol or use drugs.  REVIEW OF SYSTEMS:   General: No fevers, chills, generalized body aches.  HEENT: Denies headache, sore throat, nasal congestion, hearing or visual loss.  Pulmonary: Cough yellow sputum.  Shortness of breath now at rest, improved since placed on BiPAP.  Chest tightness with deep breath and cough primarily.  Some wheezing, limited improvement with rescue bronchodilator.  Cardiac: No palpitations, no chest pain.  No orthopnea.  No lower extremity edema.  Abdomen: No nausea vomiting diarrhea.  GU: No dysuria, frequency or hesitancy.  Musculoskeletal: No joint pain or arthralgia.  Neuro: No headaches, gait disturbance, or change in memory.  SUBJECTIVE/interval:  7/30: Admitted with working diagnosis of acute hypoxic and hypercarbic respiratory failure in the setting of community-acquired pneumonia  versus pulmonary edema, complicated by element of asthmatic versus COPD exacerbation.  Feeling better since placed on noninvasive ventilation.  Started on empiric antibiotics, bronchodilators, and systemic steroids.  VITAL SIGNS: Blood Pressure 124/63   Pulse (Abnormal) 58   Temperature (Abnormal) 96.6 F (35.9 C) (Tympanic)   Respiration (Abnormal)  8   Oxygen Saturation 100%   HEMODYNAMICS:    VENTILATOR SETTINGS: Vent Mode: PCV FiO2 (%):  [40 %-60 %] 40 % Set Rate:  [15 bmp] 15 bmp PEEP:  [5 cmH20] 5 cmH20  INTAKE / OUTPUT: No intake/output data recorded.   PHYSICAL EXAMINATION: General: 83 year old African-American female currently resting comfortably on noninvasive ventilation.  She is calm and without distress currently Neuro: Awake alert oriented no focal deficits HEENT: Normocephalic atraumatic.  BiPAP mask in place.  Mucous membranes are moist. Cardiovascular: Regular rate and rhythm without murmur rub or gallop she does have left bundle branch block Lungs: Basilar rales, faint prolonged expiratory wheeze.  Equal chest rise, no accessory use on noninvasive ventilation Abdomen: Soft, nontender, no organomegaly, positive bowel sounds Musculoskeletal: Equal strength and bulk Skin: Warm and dry  LABS:  BMET Recent Labs  Lab 11/26/17 0342 11/26/17 0613  NA 141 141  K 4.5 5.1  CL 111 109  CO2 23 22  BUN 21 24*  CREATININE 1.41* 1.38*  GLUCOSE 154* 130*    Electrolytes Recent Labs  Lab 11/26/17 0342 11/26/17 0613  CALCIUM 8.2* 8.2*  MG  --  2.3    CBC Recent Labs  Lab 11/26/17 0342 11/26/17 0613  WBC 15.3* 12.1*  HGB 13.4 13.1  HCT 42.8 41.6  PLT 256 262    Coag's No results for input(s): APTT, INR in the last 168 hours.  Sepsis Markers No results for input(s): LATICACIDVEN, PROCALCITON, O2SATVEN in the last 168 hours.  ABG Recent Labs  Lab 11/26/17 0357 11/26/17 0530  PHART 7.219* 7.264*  PCO2ART 56.8* 52.2*  PO2ART 231.0* 115.0*    Liver Enzymes Recent Labs  Lab 11/26/17 0613  AST 24  ALT 21  ALKPHOS 75  BILITOT 0.3  ALBUMIN 3.4*    Cardiac Enzymes Recent Labs  Lab 11/26/17 0613  TROPONINI 0.04*    Glucose No results for input(s): GLUCAP in the last 168 hours.  Imaging Dg Chest Portable 1 View  Result Date: 11/26/2017 CLINICAL DATA:  Found down, shortness of  breath. EXAM: PORTABLE CHEST 1 VIEW COMPARISON:  Chest radiograph August 23, 2017 FINDINGS: Cardiac silhouette is similarly enlarged. Tortuous calcified aorta. Mild interstitial prominence without pleural effusion or focal consolidation. No pneumothorax. Soft tissue planes and included osseous structures are unchanged. IMPRESSION: Mild interstitial prominence seen with atypical infection, less likely edema. Stable cardiomegaly Aortic Atherosclerosis (ICD10-I70.0). Electronically Signed   By: Awilda Metro M.D.   On: 11/26/2017 04:16     STUDIES:  Echo 7/30>>>  CULTURES: Urine strep antigen 7/30>>> Urine Legionella antigen 7/30>>> RVP 7/30>>>  ANTIBIOTICS: Azithromycin 7/30>>> Ceftriaxone 7/30>>>   LINES/TUBES:   DISCUSSION: This 83 year old female patient with a history of reactive airway disease, and possibly now COPD given ongoing smoking.  She also has a history of diastolic heart failure grade 1, and left bundle branch block.  She is now presenting with acute hypoxic and hypercarbic respiratory failure and what is most likely community-acquired pneumonia (bacterial vs viral process), complicated by underlying reactive airway disease.  Certainly could be an element of pulmonary edema however not convinced she is volume overloaded as serum creatinine actually improved with a liter fluid  bolus in the emergency room.  Agree with noninvasive positive pressure ventilation, this can be used as needed, send procalcitonin, respiratory viral panel, and urine antigens.  Also need to repeat echocardiogram.  Agree with empiric community acquired pneumonia coverage.  Continue systemic steroids and bronchodilators, we will continue to follow with you.  She will need to be followed up in the pulmonary clinic we need to repeat PFTs after her acute illness.   ASSESSMENT / PLAN:  Acute hypoxic and hypercarbic respiratory failure in the setting of community-acquired pneumonia versus pulmonary edema  (favor CAP vs viral PNA), further complicated by underlying reactive airway disease (history of asthma), also history of vocal cord dysfunction -PFTs back in 2013 demonstrated reactive airway disease by methacholine challenge, and minimal obstruction, he has continued to smoke -Chest x-ray on 7/30 demonstrates bilateral right greater than left interstitial airspace disease Plan Continue noninvasive positive pressure ventilation, PRN Wean oxygen for saturations greater than 90% Continue scheduled bronchodilators Continue systemic steroids Day #1 azithromycin and ceftriaxone Check procalcitonin Follow-up urine strep Legionella antigen Send sputum culture if able Ck RVP Needs repeat echocardiogram  continue to cycle troponins Will eventually need repeat PFTs, after acute illness resolved Continue PPI She needs to stop smoking  HFpEF, history of hypertension as well as left bundle branch block -Most recent echocardiogram March 2019: EF 50 to 55%,Mild LVH, grade 1 diastolic dysfunction.  Septal motion showed dyssynergy consistent with bundle branch block.  Peak pulmonary artery pressure 34 mmHg estimated Plan Trend BNP Repeat echocardiogram Continue telemetry monitoring Continue aspirin Holding losartan, and Coreg for now PRN apresoline  Acute on chronic renal failure.  Has history of CKD stage II -Seem to have responded favorably to gentle fluid challenge Plan Avoid hypotension Renal dose medications Hold diuresis, was on Lasix at home Gentle IV hydration  History of GERD Plan PPI twice daily   FAMILY  - Updates:    - Inter-disciplinary family meet or Palliative Care meeting due by:  8/6  DVT prophylaxis: LMWH SUP: PPI  Diet: NPO except meds Activity: BR Disposition : SDU  Simonne Martinet ACNP-BC [facility] Pulmonary/Critical Care Pager # [phone] OR # [phone] if no answer      11/26/2017, 8:59 AM

## 2017-11-26 NOTE — ED Notes (Signed)
Canceled STEMI per Dr. Raynald Kemp

## 2017-11-26 NOTE — Telephone Encounter (Signed)
Returned call, no answer, mail box full not accepting VM.

## 2017-11-26 NOTE — H&P (Addendum)
History and Physical    [name] RCV:[id number] DOB: 08-02-1934 DOA: 11/26/2017  PCP: [name], MD  Patient coming from: Home.  Chief Complaint: Shortness of breath.  HPI: [name] is a 83 y.o. female with history of COPD/asthma, CHF, hypertension presents to the ER with complaint of worsening shortness of breath.  Patient states her shortness of breath started 3 days ago with chest tightness.  Has been progressively worsening.  Her shortness of breath acutely worsened last night and called EMS.  Denies any productive cough fever chills.  ED Course: Initially there was concern for ST elevation MI but cardiology on-call was consulted and felt the patient's EKG was showing LBBB which is chronic.  Due to the shortness of breath and acute hypoxia patient was placed on BiPAP.  Initial ABG showed pH of 7.21 with PCO2 of 56 which improved after 1 hour to 7.26 with a PCO2 of 52.  On exam patient is not in distress is able to talk and chest findings showed a tight chest.  Chest x-ray shows possibility of infiltrates for which patient is placed on antibiotics.  Patient also was given nebulizer treatment IV steroids for COPD.  Review of Systems: As per HPI, rest all negative.   Past Medical History:  Diagnosis Date  . Allergic rhinitis   . Anxiety   . Arthritis   . Cataract    had eye surgery  . Depression   . Elevated troponin 04/05/2016  . Essential hypertension, benign   . GERD (gastroesophageal reflux disease)   . History of blood transfusion   . Left bundle branch block   . Memory loss   . Mixed hyperlipidemia   . Occipital neuralgia of left side   . Osteopenia   . 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 Urinary urgency     Past Surgical History:  Procedure Laterality Date  . APPENDECTOMY    . BACK SURGERY    . CARDIAC CATHETERIZATION  2010   Normal coronaries  . CATARACT EXTRACTION W/PHACO  04/16/2011   Procedure: CATARACT  EXTRACTION PHACO AND INTRAOCULAR LENS PLACEMENT (IOC);  Surgeon: Susa Simmonds;  Location: [facility];  Service: Ophthalmology;  Laterality: Left;  CDE=11.35  . CATARACT EXTRACTION W/PHACO  01/28/2012   Procedure: CATARACT EXTRACTION PHACO AND INTRAOCULAR LENS PLACEMENT (IOC);  Surgeon: Susa Simmonds, MD;  Location: [facility];  Service: Ophthalmology;  Laterality: Right;  CDI:8.15  . CYSTO WITH HYDRODISTENSION  05/29/2012   Procedure: CYSTOSCOPY/HYDRODISTENSION;  Surgeon: Martina Sinner, MD;  Location: [facility];  Service: Urology;  Laterality: N/A;  INSTILLATION OF MARCAINE AND PYRIDIUM   . DILATION AND CURETTAGE OF UTERUS  ?<hysterectomy"  . HAMMER TOE SURGERY Bilateral 11/22/11   [facility], [name]  . JOINT REPLACEMENT    . LAPAROSCOPIC CHOLECYSTECTOMY  1991  . LUMBAR LAMINECTOMY  2011  . LUMBAR LAMINECTOMY/DECOMPRESSION MICRODISCECTOMY Left 06/06/2012   Procedure: LUMBAR LAMINECTOMY/DECOMPRESSION MICRODISCECTOMY 1 LEVEL;  Surgeon: Maeola Harman, MD;  Location: [facility];  Service: Neurosurgery;  Laterality: Left;  Left Lumbar two-three Laminectomy for resection of synovial cyst  . SHOULDER OPEN ROTATOR CUFF REPAIR Right 2011  . TONSILLECTOMY    . TOTAL ABDOMINAL HYSTERECTOMY    . TOTAL HIP ARTHROPLASTY Right 03/13/2013   Procedure: TOTAL HIP ARTHROPLASTY;  Surgeon: Nestor Lewandowsky, MD;  Location: [facility];  Service: Orthopedics;  Laterality: Right;     reports that she has been smoking cigarettes.  She has a 6.00 pack-year smoking history. She has never used smokeless tobacco. She reports that she does not drink alcohol or use drugs.  Allergies  Allergen Reactions  . Lisinopril-Hydrochlorothiazide Other (See Comments)    Lip swelling and cough. Stopped by ENT. Patient is on Losartan without any issues   . Aricept [Donepezil Hcl]     Nausea and vomiting  . Codeine Itching and Swelling    eye irritation (prescribed percocet)  . Namenda [Memantine Hcl]     Nausea and vomiting  .  Penicillins Itching and Swelling    Has patient had a PCN reaction causing immediate rash, facial/tongue/throat swelling, SOB or lightheadedness with hypotension: Yes Has patient had a PCN reaction causing severe rash involving mucus membranes or skin necrosis: No Has patient had a PCN reaction that required hospitalization No Has patient had a PCN reaction occurring within the last 10 years: No If all of the above answers are "NO", then may proceed with Cephalosporin use. eye irritation    Family History  Problem Relation Age of Onset  . Diabetes Father   . Diabetes Brother   . Cancer Brother 77       colon  . Diabetes Brother   . Diabetes Brother   . Hypertension Sister   . Diabetes Sister   . Diabetes Sister   . Diabetes Other   . Allergies Other   . Diabetes Son   . Colon cancer Neg Hx   . Anesthesia problems Neg Hx   . Hypotension Neg Hx   . Malignant hyperthermia Neg Hx   . Pseudochol deficiency Neg Hx     Prior to Admission medications   Medication Sig Start Date End Date Taking? Authorizing Provider  albuterol (PROVENTIL HFA;VENTOLIN HFA) 108 (90 Base) MCG/ACT inhaler Inhale 2 puffs into the lungs every 6 (six) hours as needed for wheezing or shortness of breath. 11/11/17   [name], MD  albuterol (PROVENTIL) (2.5 MG/3ML) 0.083% nebulizer solution Take 3 mLs (2.5 mg total) by nebulization every 6 (six) hours as needed for wheezing or shortness of breath. 11/16/17   Elenora Gamma, MD  aspirin EC 81 MG tablet Take 81 mg by mouth daily.    [provider]  budesonide-formoterol (SYMBICORT) 160-4.5 MCG/ACT inhaler Inhale 2 puffs into the lungs 2 (two) times daily. 11/11/17   [name], MD  buPROPion (WELLBUTRIN SR) 150 MG 12 hr tablet TAKE (1) TABLET TWICE A DAY. 10/30/17   [name], MD  carvedilol (COREG) 6.25 MG tablet Take 6.25 mg by mouth 2 (two) times daily with a meal.    [provider]  DYMISTA 137-50 MCG/ACT SUSP One spray  per nostril twice a day 08/06/16   [name], MD  escitalopram (LEXAPRO) 10 MG tablet Take 1 tablet (10 mg total) by mouth daily. 11/15/17   [name], MD  esomeprazole (NEXIUM) 40 MG capsule Take 1 capsule (40 mg total) by mouth daily before breakfast. 07/02/17   [name], MD  furosemide (LASIX) 20 MG tablet Take 1 tablet (20 mg total) by mouth daily. Pt to take 40 mg for 2 days then decrease to 20 mg daily 07/04/17 10/02/17  Rollene Rotunda, MD  gabapentin (NEURONTIN) 100 MG capsule 1 capsule in the morning, 1 capsule at night for 1 week.  Then take 2 capsules in the morning, 1 capsule at night 11/11/17   [name], MD  loratadine (CLARITIN) 10 MG tablet Take 1  tablet (10 mg total) by mouth daily. 08/28/17   [name], MD  losartan (COZAAR) 50 MG tablet Take 1/2 tablet (25 mg total) by mouth daily. 08/28/17   [name], MD  Multiple Vitamins-Minerals (CENTRUM SILVER PO) Take 1 tablet by mouth daily.    [provider]  potassium chloride SA (K-DUR,KLOR-CON) 20 MEQ tablet Take 1 tablet (20 mEq total) by mouth daily. 07/04/17   Rollene Rotunda, MD  pravastatin (PRAVACHOL) 80 MG tablet Take 1 tablet (80 mg total) by mouth daily. 08/07/17   Rollene Rotunda, MD  predniSONE (DELTASONE) 20 MG tablet 2 po at same time daily for 5 days 11/16/17   Elenora Gamma, MD  raloxifene (EVISTA) 60 MG tablet Take 60 mg by mouth daily.    [provider]    Physical Exam: Vitals:   11/26/17 0420 11/26/17 0445 11/26/17 0500 11/26/17 0515  BP:  97/71 126/84 104/64  Pulse:  77 74 71  Resp:  13 13 14   Temp:      TempSrc:      SpO2: 100% 100% 100% 100%      Constitutional: Moderately built and nourished. Vitals:   11/26/17 0420 11/26/17 0445 11/26/17 0500 11/26/17 0515  BP:  97/71 126/84 104/64  Pulse:  77 74 71  Resp:  13 13 14   Temp:      TempSrc:      SpO2: 100% 100% 100% 100%   Eyes: Anicteric no pallor.  ENMT: No discharge from the ears eyes nose or  mouth. Neck: No mass felt.  No neck rigidity. Respiratory: Bilateral tight air entry present.  No crepitations. Cardiovascular: S1-S2 heard no murmurs appreciated. Abdomen: Soft nontender bowel sounds present.  Musculoskeletal: No edema.  No joint effusion. Skin: No rash. Neurologic: Alert awake oriented to time place and person.  Moves all extremities. Psychiatric: Appears normal per normal affect.   Labs on Admission: I have personally reviewed following labs and imaging studies  CBC: Recent Labs  Lab 11/26/17 0342  WBC 15.3*  NEUTROABS 10.2*  HGB 13.4  HCT 42.8  MCV 95.5  PLT 256   Basic Metabolic Panel: Recent Labs  Lab 11/26/17 0342  NA 141  K 4.5  CL 111  CO2 23  GLUCOSE 154*  BUN 21  CREATININE 1.41*  CALCIUM 8.2*   GFR: Estimated Creatinine Clearance: 30.3 mL/min (A) (by C-G formula based on SCr of 1.41 mg/dL (H)). Liver Function Tests: No results for input(s): AST, ALT, ALKPHOS, BILITOT, PROT, ALBUMIN in the last 168 hours. No results for input(s): LIPASE, AMYLASE in the last 168 hours. No results for input(s): AMMONIA in the last 168 hours. Coagulation Profile: No results for input(s): INR, PROTIME in the last 168 hours. Cardiac Enzymes: No results for input(s): CKTOTAL, CKMB, CKMBINDEX, TROPONINI in the last 168 hours. BNP (last 3 results) Recent Labs    07/03/17 1249 09/07/17 1157  PROBNP 1,415* 1,147*   HbA1C: No results for input(s): HGBA1C in the last 72 hours. CBG: No results for input(s): GLUCAP in the last 168 hours. Lipid Profile: No results for input(s): CHOL, HDL, LDLCALC, TRIG, CHOLHDL, LDLDIRECT in the last 72 hours. Thyroid Function Tests: No results for input(s): TSH, T4TOTAL, FREET4, T3FREE, THYROIDAB in the last 72 hours. Anemia Panel: No results for input(s): VITAMINB12, FOLATE, FERRITIN, TIBC, IRON, RETICCTPCT in the last 72 hours. Urine analysis:    Component Value Date/Time   COLORURINE YELLOW 04/05/2016 1654    APPEARANCEUR HAZY (A) 04/05/2016 1654  APPEARANCEUR Clear 10/28/2015 1622   LABSPEC 1.039 (H) 04/05/2016 1654   PHURINE 8.0 04/05/2016 1654   GLUCOSEU NEGATIVE 04/05/2016 1654   HGBUR NEGATIVE 04/05/2016 1654   BILIRUBINUR NEGATIVE 04/05/2016 1654   BILIRUBINUR Negative 10/28/2015 1622   KETONESUR NEGATIVE 04/05/2016 1654   PROTEINUR NEGATIVE 04/05/2016 1654   UROBILINOGEN 0.2 10/04/2013 1457   NITRITE NEGATIVE 04/05/2016 1654   LEUKOCYTESUR NEGATIVE 04/05/2016 1654   LEUKOCYTESUR Trace (A) 10/28/2015 1622   Sepsis Labs: @LABRCNTIP (procalcitonin:4,lacticidven:4) )No results found for this or any previous visit (from the past 240 hour(s)).   Radiological Exams on Admission: Dg Chest Portable 1 View  Result Date: 11/26/2017 CLINICAL DATA:  Found down, shortness of breath. EXAM: PORTABLE CHEST 1 VIEW COMPARISON:  Chest radiograph August 23, 2017 FINDINGS: Cardiac silhouette is similarly enlarged. Tortuous calcified aorta. Mild interstitial prominence without pleural effusion or focal consolidation. No pneumothorax. Soft tissue planes and included osseous structures are unchanged. IMPRESSION: Mild interstitial prominence seen with atypical infection, less likely edema. Stable cardiomegaly Aortic Atherosclerosis (ICD10-I70.0). Electronically Signed   By: Awilda Metro M.D.   On: 11/26/2017 04:16    EKG: Independently reviewed.  Normal sinus rhythm with LBBB.  Assessment/Plan Principal Problem:   Acute respiratory failure with hypoxia (HCC) Active Problems:   Essential hypertension   Left bundle branch block   Chronic diastolic heart failure (HCC)   Acute respiratory failure with hypoxia and hypercapnia (HCC)    1. Toxic respiratory failure with hypercapnia likely from COPD exacerbation.  Continue BiPAP for now.  I will continue patient on nebulizer treatment Pulmicort steroids.  Discussed with PCCM. 2. Possible pneumonia on empiric antibiotics.  Check urine Legionella strep  antigen. 3. Chronic diastolic CHF last EF measured was in July 17 1948 to 55% with grade 1 diastolic dysfunction.  On Lasix.  Presently n.p.o. does not look fluid overloaded.  Since creatinine is mildly elevated will hold Lasix for now. 4. Acute renal failure -cause not clear.  Hold Lasix ARB for now repeat labs. 5. Hypertension we will keep patient on PRN hydralazine since patient is n.p.o. 6. Chronic LBBB. 7. Chest tightness likely from COPD.  We will cycle cardiac markers. 8. Neuropathy -  gabapentin has to be continued once patient can take orally.   DVT prophylaxis: Lovenox. Code Status: Full code. Family Communication: Discussed with patient. Disposition Plan: Home. Consults called: PCCM. Admission status: Inpatient.   Eduard Clos MD [facility] Pager [phone].  If 7PM-7AM, please contact night-coverage [email or web address] Password [email or web address]  11/26/2017, 5:55 AM

## 2017-11-26 NOTE — Progress Notes (Signed)
A/Ox4. C/O pain in her anterior left neck.  Tender to touch.  Mild swelling noted.  Hinton Dyer, RN

## 2017-11-26 NOTE — Telephone Encounter (Signed)
Routing to Dr. Mannam as an FYI. 

## 2017-11-26 NOTE — ED Triage Notes (Signed)
Per EMS. Found on floor diaphoric, pale, air hungry, fire gave albuteral 2X, also given duo neb, 1.5inch nitro paste, 6mg  morphine.  STEMI called, also placed on cpap.  340 cancelled code stemi, once pt arrived.

## 2017-11-26 NOTE — Telephone Encounter (Signed)
Pt hospitalized today for pneumonia Daughter has some questions for Dr Oswaldo Done Please call

## 2017-11-26 NOTE — ED Notes (Signed)
RT NOTE: Removed patient from bipap and placed on 3lpm nasal cannula. Patient in no distress, vital signs stable. Will continue to monitor. RN aware.

## 2017-11-27 ENCOUNTER — Ambulatory Visit: Payer: Medicare Other | Admitting: Pediatrics

## 2017-11-27 ENCOUNTER — Other Ambulatory Visit: Payer: Self-pay

## 2017-11-27 ENCOUNTER — Inpatient Hospital Stay (HOSPITAL_COMMUNITY): Payer: Medicare Other

## 2017-11-27 ENCOUNTER — Encounter (HOSPITAL_COMMUNITY): Payer: Self-pay | Admitting: General Practice

## 2017-11-27 DIAGNOSIS — I5030 Unspecified diastolic (congestive) heart failure: Secondary | ICD-10-CM

## 2017-11-27 LAB — COMPREHENSIVE METABOLIC PANEL
ALBUMIN: 3.4 g/dL — AB (ref 3.5–5.0)
ALK PHOS: 76 U/L (ref 38–126)
ALT: 21 U/L (ref 0–44)
AST: 28 U/L (ref 15–41)
Anion gap: 10 (ref 5–15)
BUN: 19 mg/dL (ref 8–23)
CALCIUM: 8.5 mg/dL — AB (ref 8.9–10.3)
CO2: 22 mmol/L (ref 22–32)
CREATININE: 1.09 mg/dL — AB (ref 0.44–1.00)
Chloride: 107 mmol/L (ref 98–111)
GFR calc Af Amer: 54 mL/min — ABNORMAL LOW (ref >60)
GFR calc non Af Amer: 47 mL/min — ABNORMAL LOW (ref >60)
GLUCOSE: 169 mg/dL — AB (ref 70–99)
Potassium: 4.1 mmol/L (ref 3.5–5.1)
SODIUM: 139 mmol/L (ref 135–145)
Total Bilirubin: 0.4 mg/dL (ref 0.3–1.2)
Total Protein: 6.2 g/dL — ABNORMAL LOW (ref 6.5–8.1)

## 2017-11-27 LAB — CBC
HCT: 40 % (ref 36.0–46.0)
Hemoglobin: 12.9 g/dL (ref 12.0–15.0)
MCH: 29.9 pg (ref 26.0–34.0)
MCHC: 32.3 g/dL (ref 30.0–36.0)
MCV: 92.6 fL (ref 78.0–100.0)
PLATELETS: 247 10*3/uL (ref 150–400)
RBC: 4.32 MIL/uL (ref 3.87–5.11)
RDW: 14.5 % (ref 11.5–15.5)
WBC: 14.7 10*3/uL — ABNORMAL HIGH (ref 4.0–10.5)

## 2017-11-27 LAB — ECHOCARDIOGRAM COMPLETE
Height: 64 in
Weight: 2488 oz

## 2017-11-27 LAB — PROCALCITONIN: Procalcitonin: <0.1 ng/mL

## 2017-11-27 MED ORDER — LOSARTAN POTASSIUM 25 MG PO TABS
25.0000 mg | ORAL_TABLET | Freq: Every day | ORAL | Status: DC
  Administered 2017-11-27 – 2017-11-28 (×2): 25 mg via ORAL
  Filled 2017-11-27 (×2): qty 1

## 2017-11-27 MED ORDER — IPRATROPIUM-ALBUTEROL 0.5-2.5 (3) MG/3ML IN SOLN
3.0000 mL | Freq: Three times a day (TID) | RESPIRATORY_TRACT | Status: DC
  Administered 2017-11-27 – 2017-11-28 (×4): 3 mL via RESPIRATORY_TRACT
  Filled 2017-11-27 (×5): qty 3

## 2017-11-27 MED ORDER — GUAIFENESIN-DM 100-10 MG/5ML PO SYRP
5.0000 mL | ORAL_SOLUTION | ORAL | Status: DC | PRN
  Administered 2017-11-27: 5 mL via ORAL
  Filled 2017-11-27 (×2): qty 5

## 2017-11-27 MED ORDER — BUPROPION HCL ER (SR) 150 MG PO TB12
150.0000 mg | ORAL_TABLET | Freq: Two times a day (BID) | ORAL | Status: DC
  Administered 2017-11-27 – 2017-11-28 (×2): 150 mg via ORAL
  Filled 2017-11-27 (×2): qty 1

## 2017-11-27 MED ORDER — SODIUM CHLORIDE 0.9 % IV SOLN
INTRAVENOUS | Status: DC | PRN
  Administered 2017-11-27 – 2017-11-28 (×3): 250 mL via INTRAVENOUS

## 2017-11-27 MED ORDER — ESCITALOPRAM OXALATE 10 MG PO TABS
10.0000 mg | ORAL_TABLET | Freq: Every day | ORAL | Status: DC
  Administered 2017-11-27 – 2017-11-28 (×2): 10 mg via ORAL
  Filled 2017-11-27 (×2): qty 1

## 2017-11-27 MED ORDER — ASPIRIN EC 81 MG PO TBEC: 81.0000 mg | DELAYED_RELEASE_TABLET | Freq: Every day | ORAL | Status: DC

## 2017-11-27 MED ORDER — PANTOPRAZOLE SODIUM 40 MG PO TBEC
40.0000 mg | DELAYED_RELEASE_TABLET | Freq: Every day | ORAL | Status: DC
  Administered 2017-11-28: 40 mg via ORAL
  Filled 2017-11-27: qty 1

## 2017-11-27 MED ORDER — MOMETASONE FURO-FORMOTEROL FUM 200-5 MCG/ACT IN AERO
2.0000 | INHALATION_SPRAY | Freq: Two times a day (BID) | RESPIRATORY_TRACT | Status: DC
  Administered 2017-11-27 – 2017-11-28 (×2): 2 via RESPIRATORY_TRACT
  Filled 2017-11-27: qty 8.8

## 2017-11-27 MED ORDER — PREDNISONE 20 MG PO TABS
60.0000 mg | ORAL_TABLET | Freq: Every day | ORAL | Status: DC
  Administered 2017-11-27 – 2017-11-28 (×2): 60 mg via ORAL
  Filled 2017-11-27 (×2): qty 3

## 2017-11-27 NOTE — Progress Notes (Signed)
Nutrition Brief Note  Patient identified on the Malnutrition Screening Tool (MST) Report. Patient reports no weight loss and review of weight encounters reveals stable weight for the past several months. Patient reports good intake at home with no nutritional concerns.  Wt Readings from Last 15 Encounters:  11/27/17 155 lb 8 oz (70.5 kg)  11/16/17 151 lb 12.8 oz (68.9 kg)  11/11/17 152 lb 12.8 oz (69.3 kg)  11/07/17 152 lb 3.2 oz (69 kg)  10/29/17 152 lb 12.8 oz (69.3 kg)  10/01/17 153 lb (69.4 kg)  09/07/17 154 lb 6 oz (70 kg)  09/02/17 150 lb 9.6 oz (68.3 kg)  08/28/17 154 lb 9.6 oz (70.1 kg)  08/23/17 153 lb 6.4 oz (69.6 kg)  08/21/17 154 lb (69.9 kg)  08/20/17 150 lb (68 kg)  08/10/17 151 lb (68.5 kg)  08/07/17 150 lb (68 kg)  08/01/17 153 lb 3.2 oz (69.5 kg)    Body mass index is 26.69 kg/m. Patient meets criteria for overweight based on current BMI.   Current diet order is regular, patient is consuming approximately 100% of meals at this time. Labs and medications reviewed.   No nutrition interventions warranted at this time. If nutrition issues arise, please consult RD.   Joaquin Courts, RD, LDN, CNSC Pager [phone] After Hours Pager [phone]

## 2017-11-27 NOTE — Progress Notes (Signed)
[facility] PROGRESS NOTE  [name]:[id number] DOB: Sep 20, 1934 DOA: 11/26/2017 PCP: [name], MD   Narrative: 83 year old female COPD/asthma, diastolic heart failure last echo 07/08/2017, HTN carotid Dopplers with 50% stenosis 3/201, dementia on meds, L2-L3, Gouty Arthritis, hyperlipidemia reflux-recently treated at PCP with asthma exacerbation antibiotics prednisone added-diagnosed with asthma 2013 (positive methacholine challenge test) Admitted to the hospital 7/30 with shortness of breath-initial concern ST MI-patient was placed on BiPAP initial gas showing 7.2/56-CXR = infiltrates  A & Plan Acute hypoxic and hypercapnic respiratory failure possibly secondary to COPD/combined asthma-improved get rpt BNP--on admit was 132--- now rapidly Solu-Medrol to oral prednisone 60 mg  Possible pneumonia--viral respiratory panel is negative currently on azithromycin and ceftriaxone blood cultures are pending-narrow rapidly-screen for speech disorders by bedside testing with nursing-repeat 2 vw x-ray a.m. as leukocytosis persists  Carotid artery stenosis 50% bilaterally-outpatient follow-up-continue aspirin 81 daily  Hyperlipidemia-holding Pravachol 80 daily  HTN-continue losartan 25 daily, continue Coreg 6.25 twice daily  Reflux-start Protonix  Bipolar-continue bupropion 150 2 xdaily  L2-L3 spinal stenosis status post surgery       Lovenox, full code, inpatient, no family    [name] Menghini, MD  [facility] Direct contact: [phone] --Via amion app OR  --[email or web address]; password [email or web address]  7PM-7AM contact night coverage as above 11/27/2017, 10:59 AM  LOS: 1 day   Consultants:  None  Procedures: Echo 7/31  Study Conclusions   - Left ventricle: The cavity size was normal. There was mild focal   basal hypertrophy of the septum. Systolic function was normal.   The estimated ejection fraction was in the range of 55% to 60%.   Wall motion was normal; there  were no regional wall motion   abnormalities. Doppler parameters are consistent with abnormal   left ventricular relaxation (grade 1 diastolic dysfunction). - Aortic valve: There was trivial regurgitation. - Mitral valve: Calcified annulus.    Antimicrobials:  As above  Interval history/Subjective: Awake alert pleasant Some cough today no fever no chills no cp no sob no rales no rhonchi No rash, no le edema  Objective:  Vitals:  Vitals:   11/27/17 0511 11/27/17 0740  BP: (!) 156/82   Pulse: 81   Resp: 13   Temp: 98.3 F (36.8 C)   SpO2: 97% 100%    Exam:  aaf younger than stated age  eomi ncat cta decreased ae bilat-no wheeze abd soft nt nd no rebound no gaurd  I have personally reviewed the following:   Labs:  Sodium 139 BUN/creatinine 19/1.0 down from 24/1.3 LFTs are normal  Imaging studies:  1 view 7/30  Medical tests:  No  Test discussed with performing physician:  No  Decision to obtain old records:  No  Review and summation of old records:  No  Scheduled Meds: . aspirin EC  81 mg Oral Daily  . azelastine  2 spray Each Nare BID  . budesonide (PULMICORT) nebulizer solution  0.25 mg Nebulization BID  . buPROPion  150 mg Oral BID  . carvedilol  6.25 mg Oral BID WC  . gabapentin  100 mg Oral BID  . ipratropium-albuterol  3 mL Nebulization TID  . loratadine  10 mg Oral Daily  . methylPREDNISolone (SOLU-MEDROL) injection  40 mg Intravenous Q12H  . pantoprazole  40 mg Oral BID   Continuous Infusions: . sodium chloride 10 mL/hr at 11/27/17 0346  . azithromycin 250 mL/hr at 11/27/17 0600  . cefTRIAXone (ROCEPHIN)  IV Stopped (11/27/17 0346)    Principal  Problem:   Acute respiratory failure with hypoxia (HCC) Active Problems:   Essential hypertension   Left bundle branch block   Chronic diastolic heart failure (HCC)   Acute respiratory failure with hypoxia and hypercapnia (HCC)   Acute respiratory failure (HCC)   LOS: 1 day

## 2017-11-27 NOTE — Telephone Encounter (Signed)
We will need to reassess clearance for surgery as she had an exacerbation. Please make a sooner visit with me or NP  with PFTs after discharge

## 2017-11-27 NOTE — Telephone Encounter (Signed)
Attempted to call pt's daughter, Michelle. I did not receive an answer. I have left a message for Michelle to return our call.  

## 2017-11-27 NOTE — Progress Notes (Signed)
  Echocardiogram 2D Echocardiogram has been performed.  Pieter Partridge 11/27/2017, 11:57 AM

## 2017-11-28 ENCOUNTER — Inpatient Hospital Stay (HOSPITAL_COMMUNITY): Payer: Medicare Other

## 2017-11-28 LAB — CBC WITH DIFFERENTIAL/PLATELET
Abs Immature Granulocytes: 0.1 10*3/uL (ref 0.0–0.1)
BASOS PCT: 0 %
Basophils Absolute: 0 10*3/uL (ref 0.0–0.1)
EOS ABS: 0 10*3/uL (ref 0.0–0.7)
Eosinophils Relative: 0 %
HCT: 38 % (ref 36.0–46.0)
Hemoglobin: 12.5 g/dL (ref 12.0–15.0)
IMMATURE GRANULOCYTES: 1 %
LYMPHS ABS: 1.2 10*3/uL (ref 0.7–4.0)
Lymphocytes Relative: 8 %
MCH: 30.3 pg (ref 26.0–34.0)
MCHC: 32.9 g/dL (ref 30.0–36.0)
MCV: 92.2 fL (ref 78.0–100.0)
Monocytes Absolute: 0.3 10*3/uL (ref 0.1–1.0)
Monocytes Relative: 2 %
NEUTROS ABS: 12.7 10*3/uL — AB (ref 1.7–7.7)
NEUTROS PCT: 89 %
PLATELETS: 230 10*3/uL (ref 150–400)
RBC: 4.12 MIL/uL (ref 3.87–5.11)
RDW: 14.6 % (ref 11.5–15.5)
WBC: 14.2 10*3/uL — AB (ref 4.0–10.5)

## 2017-11-28 LAB — LEGIONELLA PNEUMOPHILA SEROGP 1 UR AG: L. pneumophila Serogp 1 Ur Ag: NEGATIVE

## 2017-11-28 LAB — PROCALCITONIN: Procalcitonin: <0.1 ng/mL

## 2017-11-28 MED ORDER — DOXYCYCLINE HYCLATE 100 MG PO TABS: 100.0000 mg | ORAL_TABLET | Freq: Two times a day (BID) | ORAL | 0 refills | Status: DC

## 2017-11-28 MED ORDER — PREDNISONE 20 MG PO TABS: 60.0000 mg | ORAL_TABLET | Freq: Every day | ORAL | 0 refills | Status: DC

## 2017-11-28 MED ORDER — DOXYCYCLINE HYCLATE 100 MG PO TABS
100.0000 mg | ORAL_TABLET | Freq: Two times a day (BID) | ORAL | Status: DC
  Administered 2017-11-28: 100 mg via ORAL
  Filled 2017-11-28: qty 1

## 2017-11-28 MED ORDER — GUAIFENESIN-DM 100-10 MG/5ML PO SYRP: 5.0000 mL | ORAL_SOLUTION | ORAL | 0 refills | Status: DC | PRN

## 2017-11-28 MED ORDER — AZELASTINE HCL 0.1 % NA SOLN: 2.0000 | Freq: Two times a day (BID) | NASAL | 12 refills | Status: DC

## 2017-11-28 NOTE — Progress Notes (Signed)
SATURATION QUALIFICATIONS: (This note is used to comply with regulatory documentation for home oxygen)  Patient Saturations on Room Air at Rest = 99%  Patient Saturations on Room Air while Ambulating = 96%  Patient Saturations on N/A Liters of oxygen while Ambulating = N/A%  Please briefly explain why patient needs home oxygen:Pt does not require oxygen for discharge.

## 2017-11-28 NOTE — Discharge Summary (Signed)
Physician Discharge Summary  [name] AST:[id number] DOB: 02-Dec-1934 DOA: 11/26/2017  PCP: [name], MD  Admit date: 11/26/2017 Discharge date: 11/28/2017  Time spent: 30 minutes  Recommendations for Outpatient Follow-up:  1. Burst of steroids and completion of abx needed 2. Needs screening cxr in 1 mo--consider OP slp eval if further fever and pna 3. Stop smoking cessation enforced 4. Needs labs 1 week  Discharge Diagnoses:  Principal Problem:   Acute respiratory failure with hypoxia (HCC) Active Problems:   Essential hypertension   Left bundle branch block   Chronic diastolic heart failure (HCC)   Acute respiratory failure with hypoxia and hypercapnia (HCC)   Acute respiratory failure [facility])   Discharge Condition: imrpoved  Diet recommendation: hh  Filed Weights   11/26/17 1502 11/27/17 0500 11/28/17 0423  Weight: 70.3 kg (154 lb 14.4 oz) 70.5 kg (155 lb 8 oz) 71.3 kg (157 lb 1.6 oz)    History of present illness:  83 year old female COPD/asthma, diastolic heart failure last echo 07/08/2017, HTN carotid Dopplers with 50% stenosis 3/201, dementia on meds, L2-L3, Gouty Arthritis, hyperlipidemia reflux-recently treated at PCP with asthma exacerbation antibiotics prednisone added-diagnosed with asthma 2013 (positive methacholine challenge test) Admitted to the hospital 7/30 with shortness of breath-initial concern ST MI-patient was placed on BiPAP initial gas showing 7.2/56-CXR = infiltrates     Hospital Course:  Acute hypoxic and hypercapnic respiratory failure possibly secondary to COPD/combined asthma-improved get rpt BNP--on admit was 132--- now rapidly Solu-Medrol to oral prednisone 60 mg  Possible pneumonia--viral respiratory panel is negative currently on azithromycin and ceftriaxone blood cultures are pending-narrow rapidly-screen for speech disorders by bedside testing with nursing-complete OP Doxy Might need OP slp  Carotid artery stenosis 50%  bilaterally-outpatient follow-up-continue aspirin 81 daily  Hyperlipidemia-holding Pravachol 80 daily  HTN-continue losartan 25 daily, continue Coreg 6.25 twice daily  Reflux-start Protonix  Bipolar-continue bupropion 150 2 xdaily  L2-L3 spinal stenosis status post surgery    Procedures:  no   Consultations:  no  Discharge Exam: Vitals:   11/28/17 0737 11/28/17 0940  BP:  (!) 144/70  Pulse:    Resp:    Temp:    SpO2: 100%     General: awake alert off oxygen walked in halls no fever no chills Cardiovascular:  s1 s2 no m/r/g Respiratory: cta no added sound  Discharge Instructions   Discharge Instructions    Diet - low sodium heart healthy   Complete by:  As directed    Discharge instructions   Complete by:  As directed    Complete abx and prednisone Follow up with primary MD 1 week for labs and gen check-up Stop smoking--it will do you good   Increase activity slowly   Complete by:  As directed      Allergies as of 11/28/2017      Reactions   Lisinopril-hydrochlorothiazide Other (See Comments)   Lip swelling and cough. Stopped by ENT. Patient is on Losartan without any issues    Aricept [donepezil Hcl]    Nausea and vomiting   Codeine Itching, Swelling   eye irritation (prescribed percocet)   Namenda [memantine Hcl]    Nausea and vomiting   Penicillins Itching, Swelling   Has patient had a PCN reaction causing immediate rash, facial/tongue/throat swelling, SOB or lightheadedness with hypotension: Yes Has patient had a PCN reaction causing severe rash involving mucus membranes or skin necrosis: No Has patient had a PCN reaction that required hospitalization No Has patient had a PCN  reaction occurring within the last 10 years: No If all of the above answers are "NO", then may proceed with Cephalosporin use. eye irritation      Medication List    TAKE these medications   albuterol 108 (90 Base) MCG/ACT inhaler Commonly known as:  PROVENTIL  HFA;VENTOLIN HFA Inhale 2 puffs into the lungs every 6 (six) hours as needed for wheezing or shortness of breath.   albuterol (2.5 MG/3ML) 0.083% nebulizer solution Commonly known as:  PROVENTIL Take 3 mLs (2.5 mg total) by nebulization every 6 (six) hours as needed for wheezing or shortness of breath.   aspirin EC 81 MG tablet Take 81 mg by mouth daily.   azelastine 0.1 % nasal spray Commonly known as:  ASTELIN Place 2 sprays into both nostrils 2 (two) times daily. Use in each nostril as directed   budesonide-formoterol 160-4.5 MCG/ACT inhaler Commonly known as:  SYMBICORT Inhale 2 puffs into the lungs 2 (two) times daily.   buPROPion 150 MG 12 hr tablet Commonly known as:  WELLBUTRIN SR TAKE (1) TABLET TWICE A DAY.   carvedilol 6.25 MG tablet Commonly known as:  COREG Take 6.25 mg by mouth 2 (two) times daily with a meal.   CENTRUM SILVER PO Take 1 tablet by mouth daily.   doxycycline 100 MG tablet Commonly known as:  VIBRA-TABS Take 1 tablet (100 mg total) by mouth every 12 (twelve) hours.   DYMISTA 137-50 MCG/ACT Susp Generic drug:  Azelastine-Fluticasone One spray per nostril twice a day   escitalopram 10 MG tablet Commonly known as:  LEXAPRO Take 1 tablet (10 mg total) by mouth daily.   esomeprazole 40 MG capsule Commonly known as:  NEXIUM Take 1 capsule (40 mg total) by mouth daily before breakfast.   furosemide 20 MG tablet Commonly known as:  LASIX Take 1 tablet (20 mg total) by mouth daily. Pt to take 40 mg for 2 days then decrease to 20 mg daily What changed:  additional instructions   gabapentin 100 MG capsule Commonly known as:  NEURONTIN 1 capsule in the morning, 1 capsule at night for 1 week.  Then take 2 capsules in the morning, 1 capsule at night   guaiFENesin-dextromethorphan 100-10 MG/5ML syrup Commonly known as:  ROBITUSSIN DM Take 5 mLs by mouth every 4 (four) hours as needed for cough.   loratadine 10 MG tablet Commonly known as:   CLARITIN Take 1 tablet (10 mg total) by mouth daily.   losartan 50 MG tablet Commonly known as:  COZAAR Take 1/2 tablet (25 mg total) by mouth daily.   potassium chloride SA 20 MEQ tablet Commonly known as:  K-DUR,KLOR-CON Take 1 tablet (20 mEq total) by mouth daily.   pravastatin 80 MG tablet Commonly known as:  PRAVACHOL Take 1 tablet (80 mg total) by mouth daily.   predniSONE 20 MG tablet Commonly known as:  DELTASONE Take 3 tablets (60 mg total) by mouth daily before breakfast. Start taking on:  11/29/2017   raloxifene 60 MG tablet Commonly known as:  EVISTA TAKE 1 TABLET DAILY      Allergies  Allergen Reactions  . Lisinopril-Hydrochlorothiazide Other (See Comments)    Lip swelling and cough. Stopped by ENT. Patient is on Losartan without any issues   . Aricept [Donepezil Hcl]     Nausea and vomiting  . Codeine Itching and Swelling    eye irritation (prescribed percocet)  . Namenda [Memantine Hcl]     Nausea and vomiting  . Penicillins Itching  and Swelling    Has patient had a PCN reaction causing immediate rash, facial/tongue/throat swelling, SOB or lightheadedness with hypotension: Yes Has patient had a PCN reaction causing severe rash involving mucus membranes or skin necrosis: No Has patient had a PCN reaction that required hospitalization No Has patient had a PCN reaction occurring within the last 10 years: No If all of the above answers are "NO", then may proceed with Cephalosporin use. eye irritation      The results of significant diagnostics from this hospitalization (including imaging, microbiology, ancillary and laboratory) are listed below for reference.    Significant Diagnostic Studies: Dg Chest 2 View  Result Date: 11/28/2017 CLINICAL DATA:  Pneumonia.  Asthma.  Hypertension.  Smoking history. EXAM: CHEST - 2 VIEW COMPARISON:  11/26/2017.  08/23/2017 FINDINGS: Cardiomegaly as seen previously. Aortic atherosclerosis. Patchy infiltrate/volume loss in  the lower lobes, right more than left. This is improved since the examination of 2 days ago. No worsening or new finding. Ordinary degenerative changes affect the shoulders. IMPRESSION: Radiographic improvement since 2 days ago. Mild atelectasis/patchy infiltrate in the lower lungs right more than left. Electronically Signed   By: Paulina Fusi M.D.   On: 11/28/2017 08:54   Dg Chest Portable 1 View  Result Date: 11/26/2017 CLINICAL DATA:  Found down, shortness of breath. EXAM: PORTABLE CHEST 1 VIEW COMPARISON:  Chest radiograph August 23, 2017 FINDINGS: Cardiac silhouette is similarly enlarged. Tortuous calcified aorta. Mild interstitial prominence without pleural effusion or focal consolidation. No pneumothorax. Soft tissue planes and included osseous structures are unchanged. IMPRESSION: Mild interstitial prominence seen with atypical infection, less likely edema. Stable cardiomegaly Aortic Atherosclerosis (ICD10-I70.0). Electronically Signed   By: Awilda Metro M.D.   On: 11/26/2017 04:16    Microbiology: Recent Results (from the past 240 hour(s))  Blood culture (routine x 2)     Status: None (Preliminary result)   Collection Time: 11/26/17  5:23 AM  Result Value Ref Range Status   Specimen Description BLOOD RIGHT ARM  Final   Special Requests   Final    BOTTLES DRAWN AEROBIC AND ANAEROBIC Blood Culture results may not be optimal due to an excessive volume of blood received in culture bottles   Culture   Final    NO GROWTH 1 DAY Performed at [facility], [address], [place], [place] [address]    Report Status PENDING  Incomplete  Blood culture (routine x 2)     Status: None (Preliminary result)   Collection Time: 11/26/17  5:28 AM  Result Value Ref Range Status   Specimen Description BLOOD RIGHT HAND  Final   Special Requests   Final    BOTTLES DRAWN AEROBIC ONLY Blood Culture results may not be optimal due to an excessive volume of blood received in culture bottles    Culture   Final    NO GROWTH 1 DAY Performed at [facility], [address], [place], [place] [address]    Report Status PENDING  Incomplete  Respiratory Panel by PCR     Status: None   Collection Time: 11/26/17  6:48 PM  Result Value Ref Range Status   Adenovirus NOT DETECTED NOT DETECTED Final   Coronavirus 229E NOT DETECTED NOT DETECTED Final   Coronavirus HKU1 NOT DETECTED NOT DETECTED Final   Coronavirus NL63 NOT DETECTED NOT DETECTED Final   Coronavirus OC43 NOT DETECTED NOT DETECTED Final   Metapneumovirus NOT DETECTED NOT DETECTED Final   Rhinovirus / Enterovirus NOT  DETECTED NOT DETECTED Final   Influenza A NOT DETECTED NOT DETECTED Final   Influenza B NOT DETECTED NOT DETECTED Final   Parainfluenza Virus 1 NOT DETECTED NOT DETECTED Final   Parainfluenza Virus 2 NOT DETECTED NOT DETECTED Final   Parainfluenza Virus 3 NOT DETECTED NOT DETECTED Final   Parainfluenza Virus 4 NOT DETECTED NOT DETECTED Final   Respiratory Syncytial Virus NOT DETECTED NOT DETECTED Final   Bordetella pertussis NOT DETECTED NOT DETECTED Final   Chlamydophila pneumoniae NOT DETECTED NOT DETECTED Final   Mycoplasma pneumoniae NOT DETECTED NOT DETECTED Final    Comment: Performed at [facility], [address], [place], [place] [address]     Labs: Basic Metabolic Panel: Recent Labs  Lab 11/26/17 0342 11/26/17 0613 11/27/17 0525  NA 141 141 139  K 4.5 5.1 4.1  CL 111 109 107  CO2 23 22 22   GLUCOSE 154* 130* 169*  BUN 21 24* 19  CREATININE 1.41* 1.38* 1.09*  CALCIUM 8.2* 8.2* 8.5*  MG  --  2.3  --    Liver Function Tests: Recent Labs  Lab 11/26/17 0613 11/27/17 0525  AST 24 28  ALT 21 21  ALKPHOS 75 76  BILITOT 0.3 0.4  PROT 6.0* 6.2*  ALBUMIN 3.4* 3.4*   No results for input(s): LIPASE, AMYLASE in the last 168 hours. No results for input(s): AMMONIA in the last 168 hours. CBC: Recent Labs  Lab 11/26/17 0342 11/26/17 0613 11/27/17 0525 11/28/17 0614  WBC  15.3* 12.1* 14.7* 14.2*  NEUTROABS 10.2* 10.1*  --  12.7*  HGB 13.4 13.1 12.9 12.5  HCT 42.8 41.6 40.0 38.0  MCV 95.5 95.9 92.6 92.2  PLT 256 262 247 230   Cardiac Enzymes: Recent Labs  Lab 11/26/17 0613 11/26/17 1113 11/26/17 1835  TROPONINI 0.04* 0.05* 0.05*   BNP: BNP (last 3 results) Recent Labs    08/01/17 1151 11/26/17 0342  BNP 135.0* 132.6*    ProBNP (last 3 results) Recent Labs    07/03/17 1249 09/07/17 1157  PROBNP 1,415* 1,147*    CBG: No results for input(s): GLUCAP in the last 168 hours.     Signed:  Rhetta Mura MD   [facility] 11/28/2017, 12:00 PM

## 2017-11-28 NOTE — Care Management Note (Signed)
Case Management Note  Patient Details  Name: ALMA MOHIUDDIN MRN: [id number] Date of Birth: [birth year]  Subjective/Objective: Pt presented for Acute Respiratory Failure- COPD Exacerbation. PTA from home with son that she takes care of. Plan to return home No HH services needed.                   Action/Plan: RN did ambulate the patient and she does not qualify for home oxygen at this time. Patient is aware that if she needs additional services vs DME she can contact her PCP. No further needs from CM at this time.    Expected Discharge Date:  11/28/17               Expected Discharge Plan:  Home/Self Care  In-House Referral:  NA  Discharge planning Services  CM Consult  Post Acute Care Choice:  NA Choice offered to:  NA  DME Arranged:  N/A DME Agency:  NA  HH Arranged:  Patient Refused HH(Pt states she is independent and takes care of her son. ) HH Agency:  NA  Status of Service:  Completed, signed off  If discussed at [employer] of Stay Meetings, dates discussed:    Additional Comments:  Gala Lewandowsky, RN 11/28/2017, 12:25 PM

## 2017-11-28 NOTE — Progress Notes (Signed)
Responded to spiritual care consult to support patient . Provided  Prayer, empathertc listening and emotional support.  Pt pastor involved in care. Chaplain available as needed.  Venida Jarvis, [place], [facility], Pager [phone]

## 2017-11-28 NOTE — Progress Notes (Signed)
Discharge instruction was given to pt.  Pt is awaiting for a ride.   Dajuan Turnley, RN 

## 2017-11-29 ENCOUNTER — Ambulatory Visit: Payer: Self-pay | Admitting: Pulmonary Disease

## 2017-11-29 NOTE — Progress Notes (Deleted)
@Patient  ID: , female    DOB: 12/05/1934, 83 y.o.   MRN: [id number]  No chief complaint on file.   Referring provider: [id number], MD  HPI:   Recent [place] [facility] Encounters:    83 year old with past medical history of asthma, heart failure GERD Seen at her primary care several times this year for asthma exacerbation treated with antibiotics and prednisone.  She had a chest x-ray in March 2019 which did not show acute abnormality.  States that her breathing is doing better since this episode.  Feels she is back to baseline.  Chief complaint is mild dyspnea on exertion.  No symptoms at rest.  No cough, sputum production, wheezing.  Maintained on breo.  There is some confusion about which inhaler she is on as there are notes from primary care stating that she also has Symbicort and trelegy. Has albuterol inhaler which she uses twice a day Diagnosed with asthma in 2013 with a positive methacholine challenge test, spirometry showing mild obstruction She has a 6 pack-year smoking history.  Continues to smoke a couple of cigarettes a day.  She is being evaluated by Dr. 2014 for possible arthroscopic surgery of right shoulder and requires pulmonary clearance She also has seen by cardiology and cleared.  Pets: No pets Occupation: Used to work in [name] and as a [employer]. Exposures: No known exposures, no mold, hot tub, Jacuzzi Smoking history: 6-pack-year smoker.  Continues to smoke 2 to 3 cigarettes a day Travel history: No significant travel   Tests:  11/27/2017-echocardiogram-LV ejection fraction 55 to 60% percent, grade 1 diastolic dysfunction, 11/26/2017-respiratory virus panel-none detected 11/28/2017-chest x-ray-radiologic, radiographic improvement since 2 days ago, mild atelectasis and patchy infiltrate in the lower lungs right more than left 11/26/2017- chest x-ray-mild interstitial prominence seen in the atypical infection less likely edema   11/26/2017-  hospitalization-COPD exacerbation Discharge date 11/28/2017  Plan burst of steroids, completion of antibiotics, needs chest x-ray in 1 month, and for smoking cessation, consider outpatient swallow eval if further fever and pneumonia,  07/19/2011-pulmonary function test- ratio 66, FEV1 116, mid flow reversibility after bronchodilator,   Imaging:   Cardiac:   Labs:   Micro:   Chart Review:       Allergies  Allergen Reactions  . Lisinopril-Hydrochlorothiazide Other (See Comments)    Lip swelling and cough. Stopped by ENT. Patient is on Losartan without any issues   . Aricept [Donepezil Hcl]     Nausea and vomiting  . Codeine Itching and Swelling    eye irritation (prescribed percocet)  . Namenda [Memantine Hcl]     Nausea and vomiting  . Penicillins Itching and Swelling    Has patient had a PCN reaction causing immediate rash, facial/tongue/throat swelling, SOB or lightheadedness with hypotension: Yes Has patient had a PCN reaction causing severe rash involving mucus membranes or skin necrosis: No Has patient had a PCN reaction that required hospitalization No Has patient had a PCN reaction occurring within the last 10 years: No If all of the above answers are "NO", then may proceed with Cephalosporin use. eye irritation    Immunization History  Administered Date(s) Administered  . Influenza Split 01/29/2011, 01/29/2012, 01/26/2013  . Influenza, High Dose Seasonal PF 02/15/2015, 01/23/2016  . Influenza,inj,Quad PF,6+ Mos 12/07/2013  . Pneumococcal Conjugate-13 07/07/2014  . Pneumococcal Polysaccharide-23 01/28/2010  . Zoster 10/25/2011    Past Medical History:  Diagnosis Date  . Allergic rhinitis   . Anxiety   . Arthritis   .  Asthma   . CAP (community acquired pneumonia) 11/26/2017  . Depression   . Elevated troponin 04/05/2016  . Essential hypertension, benign   . GERD (gastroesophageal reflux disease)   . History of blood transfusion    "related to  miscarriage"  . Left bundle branch block   . Memory loss   . Mixed hyperlipidemia   . Occipital neuralgia of left side   . Osteopenia   . 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 Urinary urgency     Tobacco History: Social History   Tobacco Use  Smoking Status Current Some Day Smoker  . Packs/day: 0.10  . Years: 60.00  . Pack years: 6.00  . Types: Cigarettes  Smokeless Tobacco Never Used  Tobacco Comment   Smokes a cigarette maybe once a week at the most when she feel anxious   Ready to quit: Not Answered Counseling given: Not Answered Comment: Smokes a cigarette maybe once a week at the most when she feel anxious   Outpatient Encounter Medications as of 11/29/2017  Medication Sig  . albuterol (PROVENTIL HFA;VENTOLIN HFA) 108 (90 Base) MCG/ACT inhaler Inhale 2 puffs into the lungs every 6 (six) hours as needed for wheezing or shortness of breath.  Marland Kitchen albuterol (PROVENTIL) (2.5 MG/3ML) 0.083% nebulizer solution Take 3 mLs (2.5 mg total) by nebulization every 6 (six) hours as needed for wheezing or shortness of breath.  Marland Kitchen aspirin EC 81 MG tablet Take 81 mg by mouth daily.  Marland Kitchen azelastine (ASTELIN) 0.1 % nasal spray Place 2 sprays into both nostrils 2 (two) times daily. Use in each nostril as directed  . budesonide-formoterol (SYMBICORT) 160-4.5 MCG/ACT inhaler Inhale 2 puffs into the lungs 2 (two) times daily.  Marland Kitchen buPROPion (WELLBUTRIN SR) 150 MG 12 hr tablet TAKE (1) TABLET TWICE A DAY.  . carvedilol (COREG) 6.25 MG tablet Take 6.25 mg by mouth 2 (two) times daily with a meal.  . doxycycline (VIBRA-TABS) 100 MG tablet Take 1 tablet (100 mg total) by mouth every 12 (twelve) hours.  . DYMISTA 137-50 MCG/ACT SUSP One spray per nostril twice a day  . escitalopram (LEXAPRO) 10 MG tablet Take 1 tablet (10 mg total) by mouth daily.  Marland Kitchen esomeprazole (NEXIUM) 40 MG capsule Take 1 capsule (40 mg total) by mouth daily before breakfast.  . furosemide (LASIX) 20 MG  tablet Take 1 tablet (20 mg total) by mouth daily. Pt to take 40 mg for 2 days then decrease to 20 mg daily (Patient taking differently: Take 20 mg by mouth daily. )  . gabapentin (NEURONTIN) 100 MG capsule 1 capsule in the morning, 1 capsule at night for 1 week.  Then take 2 capsules in the morning, 1 capsule at night  . guaiFENesin-dextromethorphan (ROBITUSSIN DM) 100-10 MG/5ML syrup Take 5 mLs by mouth every 4 (four) hours as needed for cough.  . loratadine (CLARITIN) 10 MG tablet Take 1 tablet (10 mg total) by mouth daily.  Marland Kitchen losartan (COZAAR) 50 MG tablet Take 1/2 tablet (25 mg total) by mouth daily.  . Multiple Vitamins-Minerals (CENTRUM SILVER PO) Take 1 tablet by mouth daily.  . potassium chloride SA (K-DUR,KLOR-CON) 20 MEQ tablet Take 1 tablet (20 mEq total) by mouth daily.  . pravastatin (PRAVACHOL) 80 MG tablet Take 1 tablet (80 mg total) by mouth daily. (Patient not taking: Reported on 11/26/2017)  . predniSONE (DELTASONE) 20 MG tablet Take 3 tablets (60 mg total) by mouth daily before breakfast.  . raloxifene (EVISTA) 60  MG tablet TAKE 1 TABLET DAILY   No facility-administered encounter medications on file as of 11/29/2017.      Review of Systems  Review of Systems    Constitutional:   No  weight loss, night sweats,  fevers, chills, fatigue, or  lassitude HEENT:   No headaches,  Difficulty swallowing,  Tooth/dental problems, or  Sore throat, No sneezing, itching, ear ache, nasal congestion, post nasal drip  CV: No chest pain,  orthopnea, PND, swelling in lower extremities, anasarca, dizziness, palpitations, syncope  GI: No heartburn, indigestion, abdominal pain, nausea, vomiting, diarrhea, change in bowel habits, loss of appetite, bloody stools Resp: No shortness of breath with exertion or at rest.  No excess mucus, no productive cough,  No non-productive cough,  No coughing up of blood.  No change in color of mucus.  No wheezing.  No chest wall deformity Skin: no rash, lesions, no  skin changes. GU: no dysuria, change in color of urine, no urgency or frequency.  No flank pain, no hematuria  MS:  No joint pain or swelling.  No decreased range of motion.  No back pain. Psych:  No change in mood or affect. No depression or anxiety.  No memory loss.      Physical Exam  There were no vitals taken for this visit.  Wt Readings from Last 5 Encounters:  11/28/17 157 lb 1.6 oz (71.3 kg)  11/16/17 151 lb 12.8 oz (68.9 kg)  11/11/17 152 lb 12.8 oz (69.3 kg)  11/07/17 152 lb 3.2 oz (69 kg)  10/29/17 152 lb 12.8 oz (69.3 kg)     Physical Exam    GEN: A/Ox3; pleasant , NAD, well nourished, appears stated age HEENT:  [place]/AT,  EACs-clear, TMs-wnl, NOSE-clear, THROAT-clear, no lesions, no postnasal drip or exudate noted.  NECK:  Supple w/ fair ROM; no JVD; normal carotid impulses w/o bruits; no thyromegaly or nodules palpated; no lymphadenopathy.   RESP  Clear  P & A; w/o, wheezes/ rales/ or rhonchi. no accessory muscle use, no dullness to percussion CARD:  RRR, no m/r/g, no peripheral edema, pulses intact: radial 2+ bilaterally, DP 2+ bilaterally, no cyanosis or clubbing. GI:   Soft & nt; nml bowel sounds; no organomegaly or masses detected.  Musco: Warm bilaterally, no deformities or joint swelling noted.  Neuro: alert, no focal deficits noted.   Skin: Warm, no lesions or rashes     Lab Results:  CBC    Component Value Date/Time   WBC 14.2 (H) 11/28/2017 0614   RBC 4.12 11/28/2017 0614   HGB 12.5 11/28/2017 0614   HGB 12.8 09/07/2017 1157   HCT 38.0 11/28/2017 0614   HCT 39.6 09/07/2017 1157   PLT 230 11/28/2017 0614   PLT 288 09/07/2017 1157   MCV 92.2 11/28/2017 0614   MCV 93 09/07/2017 1157   MCH 30.3 11/28/2017 0614   MCHC 32.9 11/28/2017 0614   RDW 14.6 11/28/2017 0614   RDW 14.9 09/07/2017 1157   LYMPHSABS 1.2 11/28/2017 0614   LYMPHSABS 2.3 09/07/2017 1157   MONOABS 0.3 11/28/2017 0614   EOSABS 0.0 11/28/2017 0614   EOSABS 0.4 09/07/2017 1157     BASOSABS 0.0 11/28/2017 0614   BASOSABS 0.0 09/07/2017 1157    BMET    Component Value Date/Time   NA 139 11/27/2017 0525   NA 147 (H) 09/07/2017 1157   K 4.1 11/27/2017 0525   CL 107 11/27/2017 0525   CO2 22 11/27/2017 0525   GLUCOSE 169 (H) 11/27/2017  0525   BUN 19 11/27/2017 0525   BUN 16 09/07/2017 1157   CREATININE 1.09 (H) 11/27/2017 0525   CREATININE 0.94 10/27/2012 1028   CALCIUM 8.5 (L) 11/27/2017 0525   GFRNONAA 47 (L) 11/27/2017 0525   GFRNONAA 60 10/27/2012 1028   GFRAA 54 (L) 11/27/2017 0525   GFRAA 69 10/27/2012 1028    BNP    Component Value Date/Time   BNP 132.6 (H) 11/26/2017 0342    ProBNP    Component Value Date/Time   PROBNP 1,147 (H) 09/07/2017 1157    Imaging: Dg Chest 2 View  Result Date: 11/28/2017 CLINICAL DATA:  Pneumonia.  Asthma.  Hypertension.  Smoking history. EXAM: CHEST - 2 VIEW COMPARISON:  11/26/2017.  08/23/2017 FINDINGS: Cardiomegaly as seen previously. Aortic atherosclerosis. Patchy infiltrate/volume loss in the lower lobes, right more than left. This is improved since the examination of 2 days ago. No worsening or new finding. Ordinary degenerative changes affect the shoulders. IMPRESSION: Radiographic improvement since 2 days ago. Mild atelectasis/patchy infiltrate in the lower lungs right more than left. Electronically Signed   By: Paulina Fusi M.D.   On: 11/28/2017 08:54   Dg Chest Portable 1 View  Result Date: 11/26/2017 CLINICAL DATA:  Found down, shortness of breath. EXAM: PORTABLE CHEST 1 VIEW COMPARISON:  Chest radiograph August 23, 2017 FINDINGS: Cardiac silhouette is similarly enlarged. Tortuous calcified aorta. Mild interstitial prominence without pleural effusion or focal consolidation. No pneumothorax. Soft tissue planes and included osseous structures are unchanged. IMPRESSION: Mild interstitial prominence seen with atypical infection, less likely edema. Stable cardiomegaly Aortic Atherosclerosis (ICD10-I70.0).  Electronically Signed   By: Awilda Metro M.D.   On: 11/26/2017 04:16     Assessment & Plan:   No problem-specific Assessment & Plan notes found for this encounter.     Coral Ceo, NP 11/29/2017

## 2017-11-29 NOTE — Telephone Encounter (Signed)
Called and spoke with Michelle-pt's daughter at [phone] Advised her of Dr. Shirlee More recommendations Scheduled appt for PFT and f/u with B.Mack on 12/02/17 at 1pm is PFT for surgery clearance Nothing further needed at this time.

## 2017-12-01 LAB — CULTURE, BLOOD (ROUTINE X 2)
CULTURE: NO GROWTH
CULTURE: NO GROWTH

## 2017-12-04 ENCOUNTER — Encounter: Payer: Self-pay | Admitting: Pediatrics

## 2017-12-04 ENCOUNTER — Ambulatory Visit (INDEPENDENT_AMBULATORY_CARE_PROVIDER_SITE_OTHER): Payer: Medicare Other | Admitting: Pediatrics

## 2017-12-04 VITALS — BP 125/73 | HR 73 | Temp 98.6°F | Ht 64.0 in | Wt 159.0 lb

## 2017-12-04 DIAGNOSIS — J9691 Respiratory failure, unspecified with hypoxia: Secondary | ICD-10-CM

## 2017-12-04 DIAGNOSIS — M5481 Occipital neuralgia: Secondary | ICD-10-CM

## 2017-12-04 DIAGNOSIS — R0989 Other specified symptoms and signs involving the circulatory and respiratory systems: Secondary | ICD-10-CM

## 2017-12-04 NOTE — Progress Notes (Signed)
Subjective:   Patient ID: [name], female    DOB: [birth year], 83 y.o.   MRN: [id number] CC: Hospitalization Follow-up  HPI: [name] is a 83 y.o. female   Admitted to the hospital 7/30, discharged 8/1.  Acute respiratory failure with hypoxemic apnea treated with steroids and antibiotics for COPD/asthma exacerbation, possible pneumonia. CXR with streaky lower bases b/l, ?aspiration.  She says she is feeling much better.  She says she does cough and choke at times on food and water.  This happens most days.  She says swallowing her pills she often feels like something goes down the wrong way.  No fevers.  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  She is smoking 1 to 2 cigarettes a day.  She continues to care of her son.  She says she is been sleeping well at night for the last few days.  Her headache is not bothered her since she got out of the hospital.  Relevant past medical, surgical, family and social history reviewed. Allergies and medications reviewed and updated. Social History   Tobacco Use  Smoking Status Current Some Day Smoker  . Packs/day: 0.10  . Years: 60.00  . Pack years: 6.00  . Types: Cigarettes  Smokeless Tobacco Never Used  Tobacco Comment   Smokes a cigarette maybe once a week at the most when she feel anxious   ROS: Per HPI   Objective:    BP 125/73   Pulse 73   Temp 98.6 F (37 C) (Oral)   Ht 5' 4"  (1.626 m)   Wt 159 lb (72.1 kg)   BMI 27.29 kg/m    Wt Readings from Last 3 Encounters:  12/04/17 159 lb (72.1 kg)  11/28/17 157 lb 1.6 oz (71.3 kg)  11/16/17 151 lb 12.8 oz (68.9 kg)   Gen: NAD, alert, cooperative with exam, NCAT EYES: EOMI, no conjunctival injection, or no icterus ENT: OP without erythema LYMPH: no cervical LAD CV: NRRR, normal S1/S2, no murmur, distal pulses 2+ b/l Resp: CTABL, no wheezes, normal WOB Abd: +BS, soft, NTND. no guarding  or organomegaly Ext: No edema, warm Neuro: Alert and oriented, strength equal b/l UE and LE, coordination grossly normal MSK: normal muscle bulk  Assessment & Plan:  [name] was seen today for hospitalization follow-up.  Diagnoses and all orders for this visit:  Respiratory failure with hypoxia, unspecified chronicity ([place]) Symptoms much improved.  She feels like her breathing is back at baseline.  Not needing albuterol. -     CBC with Differential/Platelet -     BMP8+EGFR  Sensation, choking Given recent pneumonia and episodes of coughing and choking on food and fluid at home, will go ahead and refer to speech. -     Ambulatory referral to Speech Therapy  Occipital neuralgia Headaches well controlled last week.  Continue to follow. Return precautions discussed.  Per patient request I called and talked to [place] about above.  Patient has an appointment to see the pulmonologist on 8/15 for follow-up PFTs prior to some shoulder surgery which is upcoming.  This has not been scheduled yet.  Daughter concerned about the headaches, per patient they were well controlled today, will consider increasing gabapentin, neurology referral in future if headaches ongoing.  I spent 25 minutes with the patient with over 50% of the encounter time dedicated to counseling on the above problems.  Follow up plan:  Return in about 1 month (around 01/01/2018). Assunta Found, MD [place]

## 2017-12-05 LAB — BMP8+EGFR
BUN/Creatinine Ratio: 18 (ref 12–28)
BUN: 20 mg/dL (ref 8–27)
CALCIUM: 9.4 mg/dL (ref 8.7–10.3)
CHLORIDE: 101 mmol/L (ref 96–106)
CO2: 29 mmol/L (ref 20–29)
Creatinine, Ser: 1.13 mg/dL — ABNORMAL HIGH (ref 0.57–1.00)
GFR calc Af Amer: 53 mL/min/{1.73_m2} — ABNORMAL LOW (ref >59)
GFR calc non Af Amer: 46 mL/min/{1.73_m2} — ABNORMAL LOW (ref >59)
Glucose: 60 mg/dL — ABNORMAL LOW (ref 65–99)
POTASSIUM: 4.4 mmol/L (ref 3.5–5.2)
Sodium: 144 mmol/L (ref 134–144)

## 2017-12-05 LAB — CBC WITH DIFFERENTIAL/PLATELET
Basophils Absolute: 0 10*3/uL (ref 0.0–0.2)
Basos: 0 %
EOS (ABSOLUTE): 0.1 10*3/uL (ref 0.0–0.4)
EOS: 1 %
Hematocrit: 40.7 % (ref 34.0–46.6)
Hemoglobin: 13.2 g/dL (ref 11.1–15.9)
IMMATURE GRANULOCYTES: 0 %
Immature Grans (Abs): 0 10*3/uL (ref 0.0–0.1)
Lymphocytes Absolute: 4.3 10*3/uL — ABNORMAL HIGH (ref 0.7–3.1)
Lymphs: 32 %
MCH: 29.8 pg (ref 26.6–33.0)
MCHC: 32.4 g/dL (ref 31.5–35.7)
MCV: 92 fL (ref 79–97)
MONOS ABS: 0.5 10*3/uL (ref 0.1–0.9)
Monocytes: 3 %
Neutrophils Absolute: 8.3 10*3/uL — ABNORMAL HIGH (ref 1.4–7.0)
Neutrophils: 64 %
PLATELETS: 268 10*3/uL (ref 150–450)
RBC: 4.43 x10E6/uL (ref 3.77–5.28)
RDW: 14.7 % (ref 12.3–15.4)
WBC: 13.1 10*3/uL — AB (ref 3.4–10.8)

## 2017-12-12 ENCOUNTER — Ambulatory Visit (INDEPENDENT_AMBULATORY_CARE_PROVIDER_SITE_OTHER): Payer: Medicare Other | Admitting: Pulmonary Disease

## 2017-12-12 DIAGNOSIS — R0609 Other forms of dyspnea: Secondary | ICD-10-CM | POA: Diagnosis not present

## 2017-12-12 LAB — PULMONARY FUNCTION TEST
DL/VA % pred: 79 %
DL/VA: 3.57 ml/min/mmHg/L
DLCO COR % PRED: 61 %
DLCO cor: 12.84 ml/min/mmHg
DLCO unc % pred: 61 %
DLCO unc: 12.76 ml/min/mmHg
FEF 25-75 PRE: 0.46 L/s
FEF 25-75 Post: 0.8 L/sec
FEF2575-%CHANGE-POST: 75 %
FEF2575-%PRED-PRE: 41 %
FEF2575-%Pred-Post: 72 %
FEV1-%Change-Post: 7 %
FEV1-%Pred-Post: 116 %
FEV1-%Pred-Pre: 107 %
FEV1-Post: 1.54 L
FEV1-Pre: 1.43 L
FEV1FVC-%Change-Post: -1 %
FEV1FVC-%Pred-Pre: 80 %
FEV6-%CHANGE-POST: 9 %
FEV6-%PRED-PRE: 135 %
FEV6-%Pred-Post: 148 %
FEV6-PRE: 2.22 L
FEV6-Post: 2.44 L
FEV6FVC-%Change-Post: 0 %
FEV6FVC-%PRED-POST: 99 %
FEV6FVC-%PRED-PRE: 99 %
FVC-%CHANGE-POST: 9 %
FVC-%Pred-Post: 149 %
FVC-%Pred-Pre: 136 %
FVC-Post: 2.57 L
FVC-Pre: 2.35 L
POST FEV6/FVC RATIO: 95 %
PRE FEV1/FVC RATIO: 61 %
Post FEV1/FVC ratio: 60 %
Pre FEV6/FVC Ratio: 94 %
RV % pred: 126 %
RV: 2.88 L
TLC % pred: 110 %
TLC: 5.18 L

## 2017-12-12 NOTE — Progress Notes (Signed)
PFT done today. 

## 2017-12-17 ENCOUNTER — Telehealth: Payer: Self-pay | Admitting: Pulmonary Disease

## 2017-12-17 ENCOUNTER — Other Ambulatory Visit: Payer: Self-pay | Admitting: Pediatrics

## 2017-12-17 NOTE — Telephone Encounter (Signed)
PFTs read showing mild COPD, asthma. She will need to be seen in office to ensure good control before clearence for surgery as there was a recent hospitalization with exacerbation.  If she cannot wait till 9/13 to see me then schedule with NP for hospital follow up. Thanks

## 2017-12-17 NOTE — Telephone Encounter (Signed)
Spoke with patient's daughter [name]. She was calling in regards to the patient's PFT results. She stated that she is concerned because her mother needs these results in order to proceed with her shoulder surgery.   Patient had a PFT on 8/15.   Per [name], she can be reached at work at [phone]. It is ok to leave a detailed message on her work phone or cell phone.   Dr. Isaiah Serge, please advise. Thanks!

## 2017-12-17 NOTE — Telephone Encounter (Signed)
Attempted to call pt's daughter, Tonya. I did not receive an answer. I have left a message for Tonya to return our call.  

## 2017-12-17 NOTE — Telephone Encounter (Signed)
Attempted to call pt's daughter, [name]. I did not receive an answer. I have left a message for Tonya to return our call.

## 2017-12-17 NOTE — Telephone Encounter (Signed)
Patient's daughter, [name], returning call, CB [phone].

## 2017-12-18 NOTE — Telephone Encounter (Signed)
Attempted to call pt's daughter, Tonya. I did not receive an answer. I have left a message for Tonya to return our call.  

## 2017-12-18 NOTE — Telephone Encounter (Signed)
Spoke with pt's daughter, [name]. She is aware of Dr. Shirlee More response and the pt's results. Pt has been scheduled to see Arlys John on 12/24/17 at 9am for HFU. Nothing further was needed.

## 2017-12-18 NOTE — Telephone Encounter (Signed)
Daughter [name] returned phone call; contact # [phone]

## 2017-12-23 NOTE — Progress Notes (Signed)
@Patient  ID: [name], female    DOB: 23-Dec-1934, 83 y.o.   MRN: [id number]  Chief Complaint  Patient presents with  . Hospitalization Follow-up    Asthma follow-up/COPD follow-up    Referring provider: [name], MD  HPI: 83 year old female followed in our office for asthma  PMH: GERD, CHF (pEF) Smoker/ Smoking History: Current smoker. Few cigarettes a day. 6 pack year smoking history Maintenance:  Symbicort 160 Pt of: Dr. Isaiah Serge  Recent [facility] Encounters:   11/07/2017-Initial office visit- [place]  Pt presents today with Dyspnea on exertion. Patient had chest x-ray back in March 2019 which did not show acute abnormality.  She has had multiple appointments with primary care and treated for asthma exacerbations as well as requiring antibiotics and prednisone.  She feels she is back to baseline.  Maintained on Brio Ellipta.  She is a 6-pack-year smoking history.  Continues to smoke a couple cigarettes a day. Plan: PFTs, continue Breo, cleared for shoulder surgery, CBC with differential, blood allergy panel today, follow-up in 1 to 2 months     12/24/2017  - Visit   83 year old patient seen today for follow-up visit.  Patient reports she was recently in the hospital in July/2019.  Patient reports she is been doing okay with her breathing.  Patient is using her rescue inhaler 4-5 times a day.  Patient reports she is actually without a controller inhaler at this time.  Patient was last on Symbicort 160.  Patient's only complaint today is that she has shortness of breath when walking to the mailbox and needs to actually bring her rescue inhaler with her at that time.  Patient's symptoms resolved when she uses her rescue inhaler.  Patient is completed a pulmonary function test 12/12/2017.    Asthma Control Test Answer Options: All of the time-1, most of the time-2, some of the time-3, a little of the time-4, none of the time-5  1. In the past 4 weeks how much of  your time did your asthma keep you from getting as much work done at school, home, work? 5 2. During the past 4 weeks how often if you had short of breath? 3 3. During the past 4 weeks how often is your asthma symptoms (wheezing, coughing, shortness of breath, chest tightness, or pain) wake you up at night or earlier than usual in the morning? 5 4. During the past 4 weeks how often have you used her rescue inhaler and nebulized medications (such as albuterol)?  2 5. How would you rate your asthma control during the past 4 weeks? 4  If score is 19 or less indicates your asthma may not be under control.  Score: 19    Tests:   07/19/2011-pulmonary function test- ratio 66, FEV1 116, mid flow reversibility after bronchodilator, 12/12/2017-pulmonary function test- ratio 61, mid flow reversibility, DLCO 61 >>>Mild obstructive airways disease, moderate diffusion defect  11/28/2017-chest x-ray-radiologic, radiographic improvement since 2 days ago, mild atelectasis and patchy infiltrate in the lower lungs right more than left 11/26/2017- chest x-ray-mild interstitial prominence seen in the atypical infection less likely edema  11/26/2017-respiratory virus panel-none detected 11/07/2017-respiratory allergy panel- no elevations, IgE 18 11/07/2017-CBC with differential- eosinophils relative 5.7, eosinophils absolute 0.4 09/07/2017-proBNP-1147 11/28/2017-chest x-ray- radiographic improvement since 2 days ago, mild atelectasis/patchy infiltrate in the lower lungs right greater than left  11/27/2017-echocardiogram- LV ejection fraction 55 to 60%, grade 1 diastolic dysfunction    Chart Review:  11/26/2017- hospitalization-COPD exacerbation Discharge date 11/28/2017  Allergies  Allergen Reactions  . Lisinopril-Hydrochlorothiazide Other (See Comments)    Lip swelling and cough. Stopped by ENT. Patient is on Losartan without any issues   . Aricept [Donepezil Hcl]     Nausea and vomiting  . Codeine Itching  and Swelling    eye irritation (prescribed percocet)  . Namenda [Memantine Hcl]     Nausea and vomiting  . Penicillins Itching and Swelling    Has patient had a PCN reaction causing immediate rash, facial/tongue/throat swelling, SOB or lightheadedness with hypotension: Yes Has patient had a PCN reaction causing severe rash involving mucus membranes or skin necrosis: No Has patient had a PCN reaction that required hospitalization No Has patient had a PCN reaction occurring within the last 10 years: No If all of the above answers are "NO", then may proceed with Cephalosporin use. eye irritation    Immunization History  Administered Date(s) Administered  . Influenza Split 01/29/2011, 01/29/2012, 01/26/2013  . Influenza, High Dose Seasonal PF 02/15/2015, 01/23/2016  . Influenza,inj,Quad PF,6+ Mos 12/07/2013  . Pneumococcal Conjugate-13 07/07/2014  . Pneumococcal Polysaccharide-23 01/28/2010  . Zoster 10/25/2011  Patient will need flu vaccine when available  Past Medical History:  Diagnosis Date  . Allergic rhinitis   . Anxiety   . Arthritis   . Asthma   . CAP (community acquired pneumonia) 11/26/2017  . Depression   . Elevated troponin 04/05/2016  . Essential hypertension, benign   . GERD (gastroesophageal reflux disease)   . History of blood transfusion    "related to miscarriage"  . Left bundle branch block   . Memory loss   . Mixed hyperlipidemia   . Occipital neuralgia of left side   . Osteopenia   . 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 Urinary urgency     Tobacco History: Social History   Tobacco Use  Smoking Status Current Some Day Smoker  . Packs/day: 0.10  . Years: 60.00  . Pack years: 6.00  . Types: Cigarettes  Smokeless Tobacco Never Used  Tobacco Comment   2 per day   Ready to quit: Not Answered Counseling given: Not Answered Comment: 2 per day  Smoking assessment and cessation counseling  Patient currently  smoking: 2 cigarettes a day I have advised the patient to quit/stop smoking as soon as possible due to high risk for multiple medical problems.  It will also be very difficult for [place] to manage patient's  respiratory symptoms and status if we continue to expose her lungs to a known irritant.  We do not advise e-cigarettes as a form of stopping smoking.  Patient is willing to quit smoking.  I have advised the patient that we can assist and have options of nicotine replacement therapy, provided smoking cessation education today, provided smoking cessation counseling, and provided cessation resources.  Follow-up next office visit office visit for assessment of smoking cessation.    Smoking cessation counseling advised for: 5 min    Outpatient Encounter Medications as of 12/24/2017  Medication Sig  . albuterol (PROVENTIL HFA;VENTOLIN HFA) 108 (90 Base) MCG/ACT inhaler Inhale 2 puffs into the lungs every 6 (six) hours as needed for wheezing or shortness of breath.  Marland Kitchen albuterol (PROVENTIL) (2.5 MG/3ML) 0.083% nebulizer solution Take 3 mLs (2.5 mg total) by nebulization every 6 (six) hours as needed for wheezing or shortness of breath.  Marland Kitchen aspirin EC 81 MG tablet Take 81 mg by mouth daily.  Marland Kitchen azelastine (ASTELIN) 0.1 % nasal spray Place  2 sprays into both nostrils 2 (two) times daily. Use in each nostril as directed  . budesonide-formoterol (SYMBICORT) 160-4.5 MCG/ACT inhaler Inhale 2 puffs into the lungs 2 (two) times daily.  Marland Kitchen buPROPion (WELLBUTRIN SR) 150 MG 12 hr tablet TAKE (1) TABLET TWICE A DAY.  . carvedilol (COREG) 6.25 MG tablet TAKE  (1)  TABLET TWICE A DAY WITH MEALS (BREAKFAST AND SUPPER)  . DYMISTA 137-50 MCG/ACT SUSP One spray per nostril twice a day  . escitalopram (LEXAPRO) 10 MG tablet Take 1 tablet (10 mg total) by mouth daily.  Marland Kitchen esomeprazole (NEXIUM) 40 MG capsule Take 1 capsule (40 mg total) by mouth daily before breakfast.  . gabapentin (NEURONTIN) 100 MG capsule 1 capsule in  the morning, 1 capsule at night for 1 week.  Then take 2 capsules in the morning, 1 capsule at night  . guaiFENesin-dextromethorphan (ROBITUSSIN DM) 100-10 MG/5ML syrup Take 5 mLs by mouth every 4 (four) hours as needed for cough.  . loratadine (CLARITIN) 10 MG tablet Take 1 tablet (10 mg total) by mouth daily.  Marland Kitchen losartan (COZAAR) 50 MG tablet Take 1/2 tablet (25 mg total) by mouth daily.  . Multiple Vitamins-Minerals (CENTRUM SILVER PO) Take 1 tablet by mouth daily.  . potassium chloride SA (K-DUR,KLOR-CON) 20 MEQ tablet Take 1 tablet (20 mEq total) by mouth daily.  . pravastatin (PRAVACHOL) 80 MG tablet Take 1 tablet (80 mg total) by mouth daily.  . raloxifene (EVISTA) 60 MG tablet TAKE 1 TABLET DAILY  . budesonide-formoterol (SYMBICORT) 160-4.5 MCG/ACT inhaler Inhale 2 puffs into the lungs 2 (two) times daily.  . furosemide (LASIX) 20 MG tablet Take 1 tablet (20 mg total) by mouth daily. Pt to take 40 mg for 2 days then decrease to 20 mg daily (Patient taking differently: Take 20 mg by mouth daily. )  . Spacer/Aero-Holding Chambers (AEROCHAMBER MV) inhaler Use as instructed   No facility-administered encounter medications on file as of 12/24/2017.      Review of Systems  Review of Systems  Constitutional: Negative for chills, fatigue, fever and unexpected weight change.  HENT: Negative for congestion, ear pain, postnasal drip, rhinorrhea and sore throat.   Respiratory: Positive for shortness of breath. Negative for cough, chest tightness and wheezing.   Cardiovascular: Negative for chest pain and palpitations.  Gastrointestinal: Negative for blood in stool and vomiting.  Genitourinary: Negative for dysuria, frequency and urgency.  Musculoskeletal: Negative for arthralgias.  Skin: Negative for color change.  Allergic/Immunologic: Negative for environmental allergies and food allergies.  Neurological: Negative for dizziness, light-headedness and headaches.  Psychiatric/Behavioral:  Negative for dysphoric mood. The patient is not nervous/anxious.       Physical Exam  BP 112/64 (BP Location: Left Arm, Cuff Size: Normal)   Pulse 62   Ht 5\' 2"  (1.575 m)   Wt 153 lb 12.8 oz (69.8 kg)   SpO2 94%   BMI 28.13 kg/m   Wt Readings from Last 5 Encounters:  12/24/17 153 lb 12.8 oz (69.8 kg)  12/04/17 159 lb (72.1 kg)  11/28/17 157 lb 1.6 oz (71.3 kg)  11/16/17 151 lb 12.8 oz (68.9 kg)  11/11/17 152 lb 12.8 oz (69.3 kg)     Physical Exam  Constitutional: She is oriented to person, place, and time and well-developed, well-nourished, and in no distress. No distress.  HENT:  Head: Normocephalic and atraumatic.  Right Ear: Hearing, tympanic membrane, external ear and ear canal normal.  Left Ear: Hearing, tympanic membrane, external ear and ear  canal normal.  Nose: Nose normal. Right sinus exhibits no maxillary sinus tenderness and no frontal sinus tenderness. Left sinus exhibits no maxillary sinus tenderness and no frontal sinus tenderness.  Mouth/Throat: Uvula is midline and oropharynx is clear and moist. No oropharyngeal exudate.  Eyes: Pupils are equal, round, and reactive to light.  Neck: Normal range of motion. Neck supple. No JVD present.  Cardiovascular: Normal rate, regular rhythm and normal heart sounds.  Pulmonary/Chest: Effort normal. No accessory muscle usage. No respiratory distress. She has no decreased breath sounds. She has wheezes (LUL slight exp wheeze ). She has no rhonchi.  Abdominal: Soft. Bowel sounds are normal. There is no tenderness.  Musculoskeletal: Normal range of motion. She exhibits no edema.  Lymphadenopathy:    She has no cervical adenopathy.  Neurological: She is alert and oriented to person, place, and time. Gait normal.  Skin: Skin is warm and dry. She is not diaphoretic. No erythema.  Psychiatric: Mood, memory, affect and judgment normal.  Nursing note and vitals reviewed.   12/24/17 - FENO - 43   Lab Results:  CBC      Component Value Date/Time   WBC 13.1 (H) 12/04/2017 0959   WBC 14.2 (H) 11/28/2017 0614   RBC 4.43 12/04/2017 0959   RBC 4.12 11/28/2017 0614   HGB 13.2 12/04/2017 0959   HCT 40.7 12/04/2017 0959   PLT 268 12/04/2017 0959   MCV 92 12/04/2017 0959   MCH 29.8 12/04/2017 0959   MCH 30.3 11/28/2017 0614   MCHC 32.4 12/04/2017 0959   MCHC 32.9 11/28/2017 0614   RDW 14.7 12/04/2017 0959   LYMPHSABS 4.3 (H) 12/04/2017 0959   MONOABS 0.3 11/28/2017 0614   EOSABS 0.1 12/04/2017 0959   BASOSABS 0.0 12/04/2017 0959    BMET    Component Value Date/Time   NA 144 12/04/2017 0959   K 4.4 12/04/2017 0959   CL 101 12/04/2017 0959   CO2 29 12/04/2017 0959   GLUCOSE 60 (L) 12/04/2017 0959   GLUCOSE 169 (H) 11/27/2017 0525   BUN 20 12/04/2017 0959   CREATININE 1.13 (H) 12/04/2017 0959   CREATININE 0.94 10/27/2012 1028   CALCIUM 9.4 12/04/2017 0959   GFRNONAA 46 (L) 12/04/2017 0959   GFRNONAA 60 10/27/2012 1028   GFRAA 53 (L) 12/04/2017 0959   GFRAA 69 10/27/2012 1028    BNP    Component Value Date/Time   BNP 132.6 (H) 11/26/2017 0342    ProBNP    Component Value Date/Time   PROBNP 1,147 (H) 09/07/2017 1157    Imaging: Dg Chest 2 View  Result Date: 11/28/2017 CLINICAL DATA:  Pneumonia.  Asthma.  Hypertension.  Smoking history. EXAM: CHEST - 2 VIEW COMPARISON:  11/26/2017.  08/23/2017 FINDINGS: Cardiomegaly as seen previously. Aortic atherosclerosis. Patchy infiltrate/volume loss in the lower lobes, right more than left. This is improved since the examination of 2 days ago. No worsening or new finding. Ordinary degenerative changes affect the shoulders. IMPRESSION: Radiographic improvement since 2 days ago. Mild atelectasis/patchy infiltrate in the lower lungs right more than left. Electronically Signed   By: Paulina Fusi M.D.   On: 11/28/2017 08:54   Dg Chest Portable 1 View  Result Date: 11/26/2017 CLINICAL DATA:  Found down, shortness of breath. EXAM: PORTABLE CHEST 1 VIEW  COMPARISON:  Chest radiograph August 23, 2017 FINDINGS: Cardiac silhouette is similarly enlarged. Tortuous calcified aorta. Mild interstitial prominence without pleural effusion or focal consolidation. No pneumothorax. Soft tissue planes and included osseous structures are unchanged.  IMPRESSION: Mild interstitial prominence seen with atypical infection, less likely edema. Stable cardiomegaly Aortic Atherosclerosis (ICD10-I70.0). Electronically Signed   By: Awilda Metro M.D.   On: 11/26/2017 04:16       Assessment & Plan:   Pleasant 82 year old female patient presents today for follow-up.  Patient has been without her Symbicort.  FeNO today is 43.  Will get patient started back on Symbicort 160.  We will also patient use spacer.  Patient's blood work did reveal eosinophilia as well as elevated IgE.  We could consider biologic in the future if she is adherent to her Symbicort 160 and still having worsening symptoms.  Patient to follow-up in 2 to 3 months.  Consider repeating FeNO at that office visit.  If patient is adherent to using her Symbicort.  GOLD COPD 0 with Asthmatic Component  Continue Symbicort 160 >>> 2 puffs in the morning right when you wake up, rinse out your mouth after use, 12 hours later 2 puffs, rinse after use >>> Take this daily, no matter what >>> This is not a rescue inhaler   Use the spacer that we provided for you today.  12/24/17 - FENO today - 43  Smoking Resources:  1 800 QUIT NOW  >>> Patient to call this resource and utilize it to help support her quit smoking >>> Keep up your hard work with stopping smoking  You can also contact the [facility] >>>For smoking cessation classes call [phone]  We do not recommend using e-cigarettes as a form of stopping smoking   We recommend that you receive the flu vaccine when available >>>Please try to have this completed by October/2019 >>>If you receive this outside of a [facility] facility please  contact our office so we can update your shot records  Follow up with our office in 2-3 months     Tobacco use Smoking Cessation Resources:  1 800 QUIT NOW  >>> Patient to call this resource and utilize it to help support her quit smoking >>> Keep up your hard work with stopping smoking  You can also contact the [facility] >>>For smoking cessation classes call [phone]  We do not recommend using e-cigarettes as a form of stopping smoking   We recommend that you receive the flu vaccine when available     Coral Ceo, NP 12/24/2017

## 2017-12-24 ENCOUNTER — Ambulatory Visit (INDEPENDENT_AMBULATORY_CARE_PROVIDER_SITE_OTHER): Payer: Medicare Other | Admitting: Pulmonary Disease

## 2017-12-24 ENCOUNTER — Telehealth: Payer: Self-pay | Admitting: Pediatrics

## 2017-12-24 ENCOUNTER — Encounter: Payer: Self-pay | Admitting: Pulmonary Disease

## 2017-12-24 VITALS — BP 112/64 | HR 62 | Ht 62.0 in | Wt 153.8 lb

## 2017-12-24 DIAGNOSIS — J449 Chronic obstructive pulmonary disease, unspecified: Secondary | ICD-10-CM

## 2017-12-24 DIAGNOSIS — F17210 Nicotine dependence, cigarettes, uncomplicated: Secondary | ICD-10-CM | POA: Diagnosis not present

## 2017-12-24 DIAGNOSIS — J45909 Unspecified asthma, uncomplicated: Secondary | ICD-10-CM | POA: Diagnosis not present

## 2017-12-24 DIAGNOSIS — Z720 Tobacco use: Secondary | ICD-10-CM | POA: Insufficient documentation

## 2017-12-24 LAB — NITRIC OXIDE: Nitric Oxide: 43

## 2017-12-24 MED ORDER — AEROCHAMBER MV MISC: 0 refills | Status: AC

## 2017-12-24 MED ORDER — BUDESONIDE-FORMOTEROL FUMARATE 160-4.5 MCG/ACT IN AERO: 2.0000 | INHALATION_SPRAY | Freq: Two times a day (BID) | RESPIRATORY_TRACT | 0 refills | Status: DC

## 2017-12-24 NOTE — Progress Notes (Signed)
Discussed results with patient in office.  Nothing further is needed at this time.  Anayeli Arel FNP  

## 2017-12-24 NOTE — Telephone Encounter (Signed)
Appt made to see Dr. Oswaldo Done 01/03/18 at 11:15am

## 2017-12-24 NOTE — Patient Instructions (Addendum)
Continue Symbicort 160 >>> 2 puffs in the morning right when you wake up, rinse out your mouth after use, 12 hours later 2 puffs, rinse after use >>> Take this daily, no matter what >>> This is not a rescue inhaler   Use the spacer that we provided for you today.  FENO today  Smoking Resources:  1 800 QUIT NOW  >>> Patient to call this resource and utilize it to help support her quit smoking >>> Keep up your hard work with stopping smoking  You can also contact the [facility] >>>For smoking cessation classes call [phone]  We do not recommend using e-cigarettes as a form of stopping smoking    We recommend that you receive the flu vaccine when available >>>Please try to have this completed by October/2019 >>>If you receive this outside of a [facility] facility please contact our office so we can update your shot records   Follow up with our office in 2-3 months    Please contact the office if your symptoms worsen or you have concerns that you are not improving.   Thank you for choosing [facility] for your healthcare, and for allowing [place] to partner with you on your healthcare journey. I am thankful to be able to provide care to you today.   Elisha Headland FNP-C        Coping with Quitting Smoking Quitting smoking is a physical and mental challenge. You will face cravings, withdrawal symptoms, and temptation. Before quitting, work with your health care provider to make a plan that can help you cope. Preparation can help you quit and keep you from giving in. How can I cope with cravings? Cravings usually last for 5-10 minutes. If you get through it, the craving will pass. Consider taking the following actions to help you cope with cravings:  Keep your mouth busy: ? Chew sugar-free gum. ? Suck on hard candies or a straw. ? Brush your teeth.  Keep your hands and body busy: ? Immediately change to a different activity when you feel a  craving. ? Squeeze or play with a ball. ? Do an activity or a hobby, like making bead jewelry, practicing needlepoint, or working with wood. ? Mix up your normal routine. ? Take a short exercise break. Go for a quick walk or run up and down stairs. ? Spend time in public places where smoking is not allowed.  Focus on doing something kind or helpful for someone else.  Call a friend or family member to talk during a craving.  Join a support group.  Call a quit line, such as 1-800-QUIT-NOW.  Talk with your health care provider about medicines that might help you cope with cravings and make quitting easier for you.  How can I deal with withdrawal symptoms? Your body may experience negative effects as it tries to get used to not having nicotine in the system. These effects are called withdrawal symptoms. They may include:  Feeling hungrier than normal.  Trouble concentrating.  Irritability.  Trouble sleeping.  Feeling depressed.  Restlessness and agitation.  Craving a cigarette.  To manage withdrawal symptoms:  Avoid places, people, and activities that trigger your cravings.  Remember why you want to quit.  Get plenty of sleep.  Avoid coffee and other caffeinated drinks. These may worsen some of your symptoms.  How can I handle social situations? Social situations can be difficult when you are quitting smoking, especially in the first few weeks. To manage  this, you can:  Avoid parties, bars, and other social situations where people might be smoking.  Avoid alcohol.  Leave right away if you have the urge to smoke.  Explain to your family and friends that you are quitting smoking. Ask for understanding and support.  Plan activities with friends or family where smoking is not an option.  What are some ways I can cope with stress? Wanting to smoke may cause stress, and stress can make you want to smoke. Find ways to manage your stress. Relaxation techniques can help.  For example:  Breathe slowly and deeply, in through your nose and out through your mouth.  Listen to soothing, relaxing music.  Talk with a family member or friend about your stress.  Light a candle.  Soak in a bath or take a shower.  Think about a peaceful place.  What are some ways I can prevent weight gain? Be aware that many people gain weight after they quit smoking. However, not everyone does. To keep from gaining weight, have a plan in place before you quit and stick to the plan after you quit. Your plan should include:  Having healthy snacks. When you have a craving, it may help to: ? Eat plain popcorn, crunchy carrots, celery, or other cut vegetables. ? Chew sugar-free gum.  Changing how you eat: ? Eat small portion sizes at meals. ? Eat 4-6 small meals throughout the day instead of 1-2 large meals a day. ? Be mindful when you eat. Do not watch television or do other things that might distract you as you eat.  Exercising regularly: ? Make time to exercise each day. If you do not have time for a long workout, do short bouts of exercise for 5-10 minutes several times a day. ? Do some form of strengthening exercise, like weight lifting, and some form of aerobic exercise, like running or swimming.  Drinking plenty of water or other low-calorie or no-calorie drinks. Drink 6-8 glasses of water daily, or as much as instructed by your health care provider.  Summary  Quitting smoking is a physical and mental challenge. You will face cravings, withdrawal symptoms, and temptation to smoke again. Preparation can help you as you go through these challenges.  You can cope with cravings by keeping your mouth busy (such as by chewing gum), keeping your body and hands busy, and making calls to family, friends, or a helpline for people who want to quit smoking.  You can cope with withdrawal symptoms by avoiding places where people smoke, avoiding drinks with caffeine, and getting plenty  of rest.  Ask your health care provider about the different ways to prevent weight gain, avoid stress, and handle social situations. This information is not intended to replace advice given to you by your health care provider. Make sure you discuss any questions you have with your health care provider. Document Released: 04/13/2016 Document Revised: 04/13/2016 Document Reviewed: 04/13/2016 Elsevier Interactive Patient Education  [employer].

## 2017-12-24 NOTE — Assessment & Plan Note (Signed)
Smoking Cessation Resources:  1 800 QUIT NOW  >>> Patient to call this resource and utilize it to help support her quit smoking >>> Keep up your hard work with stopping smoking  You can also contact the [facility] >>>For smoking cessation classes call [phone]  We do not recommend using e-cigarettes as a form of stopping smoking   We recommend that you receive the flu vaccine when available

## 2017-12-24 NOTE — Assessment & Plan Note (Signed)
Continue Symbicort 160 >>> 2 puffs in the morning right when you wake up, rinse out your mouth after use, 12 hours later 2 puffs, rinse after use >>> Take this daily, no matter what >>> This is not a rescue inhaler   Use the spacer that we provided for you today.  12/24/17 - FENO today - 43  Smoking Resources:  1 800 QUIT NOW  >>> Patient to call this resource and utilize it to help support her quit smoking >>> Keep up your hard work with stopping smoking  You can also contact the [facility] >>>For smoking cessation classes call [phone]  We do not recommend using e-cigarettes as a form of stopping smoking   We recommend that you receive the flu vaccine when available >>>Please try to have this completed by October/2019 >>>If you receive this outside of a [facility] facility please contact our office so we can update your shot records  Follow up with our office in 2-3 months

## 2017-12-25 ENCOUNTER — Telehealth: Payer: Self-pay | Admitting: Pulmonary Disease

## 2017-12-25 NOTE — Telephone Encounter (Signed)
Called and spoke with patients daughter, she stated that the patient is awaiting clearance for arm surgery. Patient was supposed to have surgery last month and was cleared by Dr. Isaiah Serge but surgery was cancelled due to patient being admitted into the hospital for SOB. They are wanting to have the surgery scheduled soon but stated that they cannot do that until there is a new surgical clearance given. Patients daughter is wanting to know what steps she needs to take next.   Dr. Isaiah Serge please advise, thank you.

## 2017-12-26 ENCOUNTER — Ambulatory Visit: Payer: Self-pay | Admitting: Pulmonary Disease

## 2017-12-26 ENCOUNTER — Telehealth: Payer: Self-pay

## 2017-12-26 ENCOUNTER — Telehealth: Payer: Self-pay | Admitting: Pediatrics

## 2017-12-26 NOTE — Telephone Encounter (Signed)
Spoke with daughter [name], made daughter aware the patient has been cleared for surgical clearance. Voiced understanding. Confirmed that MD [name] at [facility] would be doing the surgery. Surgical Clearance faxed and confirmation received. Will place in NP B Mack scan folder. Nothing further needed at this time.

## 2017-12-26 NOTE — Telephone Encounter (Signed)
Rx written and faxed over to [facility] and left message that rx was sent and to call back with any further questions or concerns.

## 2017-12-26 NOTE — Telephone Encounter (Signed)
Yes, that's fine, thanks 

## 2017-12-26 NOTE — Telephone Encounter (Signed)
PT daughter [name] has called for PT needing a BP cuff and machine sent to [facility]

## 2017-12-26 NOTE — Telephone Encounter (Signed)
She saw Elisha Headland this week. I think she should be ok if she was stable. Will CC Arlys John

## 2017-12-26 NOTE — Telephone Encounter (Signed)
Arlys John would you be willing to give the pt surgical clearance based on the last visit? Thanks!

## 2017-12-26 NOTE — Telephone Encounter (Signed)
Ok to do rx?

## 2017-12-26 NOTE — Telephone Encounter (Signed)
Wants a prescription for a BP cuff

## 2017-12-26 NOTE — Telephone Encounter (Signed)
Unfortunately the patient did not discuss this with me in office visit.  Patient was at a stable interval.  I believe she would be okay with proceeding forward with surgery.  Patient will need to follow-up with our office if she has respiratory complications or worsening breathing after surgery.  [facility] Postperative Pulmonary Risk Score Comment Score  Type of surgery - abd ao aneurysm (27), thoracic (21), neurosurgery / upper abdominal / vascular (21), neck (11) no 0  Emergency Surgery - (11) no 0  ALbumin < 3 or poor nutritional state - (9) - 0  BUN > 30 -  (8) 8/7 - 18 0  Partial or completely dependent functional status - (7) no 0  COPD -  (6) Asthma 6  Age - 60 to 2 (4), > 70  (6) 80 6  TOTAL  12  Risk Stratifcation scores  - < 10, 11-19, 20-27, 28-40, >40     CANET Postperative Pulmonary Risk Score Comment Score  Age - <50 (0), 50-80 (3), >80 (16) 80 16  Preoperative pulse ox - >96 (0), 91-95 (8), <90 (24) 94 8  Respiratory infection in last month - Yes (17) no 0  Preoperative anemia - < 10gm% - Yes (11) 8/7 - 13.2 0  Surgical incision - Upper abdominal (15), Thoracic (24) no 0  Duration of surgery - <2h (0), 2-3h (16), >3h (23) 2-3 16  Emergency Surgery - Yes (8) no 0  TOTAL    Risk Stratification - Low (<26), Intermediate (26-44), High (>45)  40   Patient is a moderate risk for pulmonary complications.  She can proceed forward with surgery.  Major Pulmonary risks identified in the multifactorial risk analysis are but not limited to a) pneumonia; b) recurrent intubation risk; c) prolonged or recurrent acute respiratory failure needing mechanical ventilation; d) prolonged hospitalization; e) DVT/Pulmonary embolism; f) Acute Pulmonary edema  Recommend 1. Short duration of surgery as much as possible and avoid paralytic if possible 2.  If inpatient please have patient recover in step down or ICU with Pulmonary consultation, If outpatient procedure then follow up closely with  specialists as needed  3. DVT prophylaxis 4. Aggressive pulmonary toilet with o2, bronchodilatation, and incentive spirometry and early ambulation      Patient can proceed forward with surgery.  Please follow-up with our office as scheduled.  If patient has worsening complications with respiratory status or concerns regarding her respiratory status please follow-up with our office.   Elisha Headland FNP

## 2018-01-02 ENCOUNTER — Ambulatory Visit: Payer: Medicare Other | Admitting: Pediatrics

## 2018-01-03 ENCOUNTER — Telehealth: Payer: Self-pay | Admitting: Pediatrics

## 2018-01-03 ENCOUNTER — Ambulatory Visit (INDEPENDENT_AMBULATORY_CARE_PROVIDER_SITE_OTHER): Payer: Medicare Other | Admitting: Pediatrics

## 2018-01-03 ENCOUNTER — Encounter: Payer: Self-pay | Admitting: Pediatrics

## 2018-01-03 DIAGNOSIS — M5481 Occipital neuralgia: Secondary | ICD-10-CM | POA: Diagnosis not present

## 2018-01-03 MED ORDER — GABAPENTIN 100 MG PO CAPS: ORAL_CAPSULE | ORAL | 1 refills | Status: DC

## 2018-01-03 NOTE — Progress Notes (Signed)
  Subjective:   Patient ID: [name], female    DOB: 1935/01/08, 83 y.o.   MRN: [id number] CC: Headache  HPI: [name] is a 83 y.o. female  Here today with friend  Posterior L side of head, up to over her ear, hurting during headaches, feels burning/tingling or numb at times. Laying on her L side putting pressure on her L side of head helps sometimes, laying down and resting helps some. Ibuprofen gives minimal relief. Headaches come on daily now past few weeks, sometimes a couple times a day. Last for several minutes to sometimes over several hours. Last night had episode of vomiting during headache.  Headaches happening more often.  Some dizziness off and on. Has had similar headaches off and on past two years. Had MRI done for same in 12/2015 showing atrophy. In July 2019 with headache flare, was started on gabapentin to treat occipital neuralgia. After two weeks was having some improvement in symptoms.  When seen 8/7 for hospital follow up headaches had improved. Past two weeks symptoms have worsened again.  Relevant past medical, surgical, family and social history reviewed. Allergies and medications reviewed and updated.  ROS: Per HPI   Objective:    BP 135/75   Pulse 78   Temp 98.1 F (36.7 C) (Oral)   Ht 5\' 2"  (1.575 m)   Wt 155 lb 9.6 oz (70.6 kg)   BMI 28.46 kg/m   Wt Readings from Last 3 Encounters:  01/03/18 155 lb 9.6 oz (70.6 kg)  12/24/17 153 lb 12.8 oz (69.8 kg)  12/04/17 159 lb (72.1 kg)    Gen: NAD, alert, cooperative with exam, NCAT EYES: EOMI, no conjunctival injection, or no icterus ENT:  TMs pearly gray b/l, OP without erythema LYMPH: no cervical LAD CV: NRRR, normal S1/S2, no murmur Resp: CTABL, no wheezes, normal WOB Abd: +BS, soft, NTND.  Ext: No edema, warm Neuro: Alert and oriented, strength equal b/l UE and LE, coordination grossly normal Skin: altered sensation L occiput up to over ear compared to R side  Assessment & Plan:  [name]  was seen today for headache.  Diagnoses and all orders for this visit:  Occipital neuralgia of left side -     gabapentin (NEURONTIN) 100 MG capsule; Then take 2 capsules in the morning, 2 capsule at night for 1 week, then 3 caps morning and night -     Ambulatory referral to Neurology   Follow up plan: Return in about 4 weeks (around 01/31/2018). 04/02/2018, MD Rex Kras [facility]

## 2018-01-03 NOTE — Patient Instructions (Addendum)
  Gabapentin 100mg : Take 2 capsules in the morning, 2 capsule at night for 1 week, then 3 caps morning and night  Call me in 2 weeks with symptoms

## 2018-01-03 NOTE — Telephone Encounter (Signed)
Increasing gabapentin to better control headaches and getting her into see neurology

## 2018-01-04 NOTE — Telephone Encounter (Signed)
Returned daughters phone call with Dr. Oswaldo Done recommendations and plan for patient.  Daughter had multiple questions about patient's medication.  Could the Gabapentin be causing the patient's dizziness, previous dose, and current dose.  Patient had questions about shoulder surgery, should patient see neurology before having surgery and was very demanding that referral should have been placed STAT and that she thinks the medicine is causing patient's headaches and dizziness. Informed daughter that patient was cleared by pulmonologist to have surgery and that referral was placed routine.

## 2018-01-08 ENCOUNTER — Other Ambulatory Visit: Payer: Self-pay | Admitting: Pediatrics

## 2018-01-08 DIAGNOSIS — J449 Chronic obstructive pulmonary disease, unspecified: Secondary | ICD-10-CM

## 2018-01-09 ENCOUNTER — Ambulatory Visit: Payer: Self-pay | Admitting: Pulmonary Disease

## 2018-01-18 ENCOUNTER — Ambulatory Visit (INDEPENDENT_AMBULATORY_CARE_PROVIDER_SITE_OTHER): Payer: Medicare Other | Admitting: Pediatrics

## 2018-01-18 ENCOUNTER — Encounter: Payer: Self-pay | Admitting: Pediatrics

## 2018-01-18 VITALS — BP 169/85 | HR 76 | Temp 97.4°F | Resp 18 | Wt 151.6 lb

## 2018-01-18 DIAGNOSIS — M5481 Occipital neuralgia: Secondary | ICD-10-CM

## 2018-01-18 DIAGNOSIS — J441 Chronic obstructive pulmonary disease with (acute) exacerbation: Secondary | ICD-10-CM

## 2018-01-18 MED ORDER — LEVOFLOXACIN 500 MG PO TABS: 500.0000 mg | ORAL_TABLET | Freq: Every day | ORAL | 0 refills | Status: DC

## 2018-01-18 MED ORDER — PREDNISONE 10 MG PO TABS: 10.0000 mg | ORAL_TABLET | Freq: Every day | ORAL | 0 refills | Status: DC

## 2018-01-18 MED ORDER — METHYLPREDNISOLONE ACETATE 80 MG/ML IJ SUSP
80.0000 mg | Freq: Once | INTRAMUSCULAR | Status: AC
  Administered 2018-01-18: 80 mg via INTRAMUSCULAR

## 2018-01-18 MED ORDER — GABAPENTIN 100 MG PO CAPS: ORAL_CAPSULE | ORAL | 1 refills | Status: DC

## 2018-01-18 NOTE — Progress Notes (Signed)
  Subjective:   Patient ID: [name], female    DOB: August 09, 1934, 83 y.o.   MRN: [id number] CC: URI symptoms x2 days and Headache (sharp pain L side of head x mths)  HPI: [name] is a 83 y.o. female   Symptoms started 2 days ago, she is been taking albuterol 1-2 times a day.  Wheezing more.  Nasal congestion and cough, productive of green sputum.  No fevers.  Appetite is been down.  Not resting well since she got sick.  Headache: Ongoing symptoms.  Has an appointment to see neurology 6 weeks.  Did not increase gabapentin dose after last visit, taking 2 tabs in the morning and 2 tabs at night now.  Relevant past medical, surgical, family and social history reviewed. Allergies and medications reviewed and updated. Social History   Tobacco Use  Smoking Status Current Some Day Smoker  . Packs/day: 0.10  . Years: 60.00  . Pack years: 6.00  . Types: Cigarettes  Smokeless Tobacco Never Used  Tobacco Comment   2 per day   ROS: Per HPI   Objective:    BP (!) 169/85   Pulse 76   Temp (!) 97.4 F (36.3 C) (Oral)   Resp 18   Wt 151 lb 9.6 oz (68.8 kg)   SpO2 97%   BMI 27.73 kg/m   Wt Readings from Last 3 Encounters:  01/18/18 151 lb 9.6 oz (68.8 kg)  01/03/18 155 lb 9.6 oz (70.6 kg)  12/24/17 153 lb 12.8 oz (69.8 kg)    Gen: NAD, alert, cooperative with exam, NCAT, nasal congestion EYES: EOMI, no conjunctival injection, or no icterus ENT:  TMs dull gray b/l, OP without erythema LYMPH: no cervical LAD CV: NRRR, normal S1/S2, no murmur, distal pulses 2+ b/l Resp: Expiratory wheeze throughout.  No crackles.  Speaking in long phrases.  Comfortable WOB Abd: +BS, soft, NTND. no guarding or organomegaly Ext: No edema, warm Neuro: Alert and oriented  Assessment & Plan:  [name] was seen today for uri and headache.  Diagnoses and all orders for this visit:  COPD exacerbation (HCC) Oxygen saturation levels normal.  Will treat with below, steroid injection today, start  oral steroids tomorrow.  Start antibiotics today.  Will follow-up with me in 3 days.  Sooner if needed.  Return precautions discussed. -     levofloxacin (LEVAQUIN) 500 MG tablet; Take 1 tablet (500 mg total) by mouth daily. -     predniSONE (DELTASONE) 10 MG tablet; Take 1 tablet (10 mg total) by mouth daily with breakfast. Take 4 tabs x 3 days, then 3, 2,  1 tab each for 3 days -     methylPREDNISolone acetate (DEPO-MEDROL) injection 80 mg  Occipital neuralgia of left side Increase dose to 300 twice daily.  Will increase to 300  tid next visit. -     gabapentin (NEURONTIN) 100 MG capsule; 3 caps morning and night   Follow up plan: Return in about 3 days (around 01/21/2018). Rex Kras, MD [facility]

## 2018-01-18 NOTE — Patient Instructions (Addendum)
Albuterol 3-4 times a day while on antibiotics. Either with nebulizer or with inhaler.  Continue symbicort two puffs morning and night  Start prednisone tomorrow, you received a steroid shot today.  Start antibiotic (levofloxacin) today.  Take gabapentin 300mg  (three 100mg  tabs) morning and night for the headaches

## 2018-01-22 ENCOUNTER — Encounter: Payer: Self-pay | Admitting: Pediatrics

## 2018-01-22 ENCOUNTER — Ambulatory Visit (INDEPENDENT_AMBULATORY_CARE_PROVIDER_SITE_OTHER): Payer: Medicare Other | Admitting: Pediatrics

## 2018-01-22 VITALS — BP 126/71 | HR 64 | Temp 97.5°F | Ht 62.0 in | Wt 156.4 lb

## 2018-01-22 DIAGNOSIS — J441 Chronic obstructive pulmonary disease with (acute) exacerbation: Secondary | ICD-10-CM

## 2018-01-22 DIAGNOSIS — M5481 Occipital neuralgia: Secondary | ICD-10-CM

## 2018-01-22 MED ORDER — GABAPENTIN 400 MG PO CAPS: 400.0000 mg | ORAL_CAPSULE | Freq: Two times a day (BID) | ORAL | 1 refills | Status: DC

## 2018-01-22 NOTE — Progress Notes (Signed)
  Subjective:   Patient ID: [name], female    DOB: [birth year], 83 y.o.   MRN: [id number] CC: COPD (followup, still has a cold, some better)  HPI: [name] is a 83 y.o. female   COPD: Seen 4 days ago for COPD exacerbation, started on prednisone and levofloxacin.  Now taking albuterol to 3 times a day.  She continues to have some cough, she is feeling better.  Still with some runny nose.  No fevers.  Appetite is back to normal.  Overall she is feeling better.  Less shortness of breath.  Cough is still productive at times, sometimes just dry.  She is not yet done with steroid taper or antibiotics.  She is taking symbicort as needed, not every day.   Occipital neuralgia: Better with the increased gabapentin.  Continues to be bothered regularly by the headaches.  She has an upcoming appoint with neurology.  Relevant past medical, surgical, family and social history reviewed. Allergies and medications reviewed and updated. Social History   Tobacco Use  Smoking Status Current Some Day Smoker  . Packs/day: 0.10  . Years: 60.00  . Pack years: 6.00  . Types: Cigarettes  Smokeless Tobacco Never Used  Tobacco Comment   2 per day   ROS: Per HPI   Objective:    BP 126/71   Pulse 64   Temp (!) 97.5 F (36.4 C) (Oral)   Ht 5\' 2"  (1.575 m)   Wt 156 lb 6.4 oz (70.9 kg)   BMI 28.61 kg/m   Wt Readings from Last 3 Encounters:  01/22/18 156 lb 6.4 oz (70.9 kg)  01/18/18 151 lb 9.6 oz (68.8 kg)  01/03/18 155 lb 9.6 oz (70.6 kg)    Gen: NAD, alert, cooperative with exam, NCAT EYES: EOMI, no conjunctival injection, or no icterus ENT: OP without erythema LYMPH: no cervical LAD CV: NRRR, normal S1/S2, no murmur, distal pulses 2+ b/l Resp: Slight end expiratory wheeze present, moving air well, no crackles, comfortable WOB Abd: +BS, soft, NTND. no guarding or organomegaly Ext: No edema, warm Neuro: Alert and oriented, strength equal b/l UE and LE, coordination grossly normal MSK:  normal muscle bulk  Assessment & Plan:  Scott was seen today for copd.  Diagnoses and all orders for this visit:  Occipital neuralgia of left side -     gabapentin (NEURONTIN) 400 MG capsule; Take 1 capsule (400 mg total) by mouth 2 (two) times daily.  COPD exacerbation (HCC) Continue steroid taper, antibiotics until course completed.  Any worsening symptoms needs to be seen. Symbicort should be 2 puffs in the morning 2 puffs at night every day even when she is feeling well. Albuterol she should continue twice a day while on the steroids and antibiotics.  Then take only as needed.  Let [name] know if regularly needing it  Follow up plan: Return in about 4 weeks (around 02/19/2018). 02/21/2018, MD Rex Kras [facility]

## 2018-01-31 ENCOUNTER — Ambulatory Visit: Payer: Medicare Other | Admitting: Pediatrics

## 2018-02-04 ENCOUNTER — Telehealth: Payer: Self-pay | Admitting: Pediatrics

## 2018-02-04 NOTE — Telephone Encounter (Signed)
Spoke to daughter of patient and she states that pateint has been feeling fatigue since she was last seen 9/25.  States that her cough has improved some but not fatigue. Requesting lab work and B 12 to be check.  Aware Dr. Oswaldo Done is not here today and we will return call when she returns. Please advise

## 2018-02-05 NOTE — Telephone Encounter (Signed)
Can you schedule appt to be seen with me?

## 2018-02-05 NOTE — Telephone Encounter (Signed)
appt made

## 2018-02-06 ENCOUNTER — Ambulatory Visit (INDEPENDENT_AMBULATORY_CARE_PROVIDER_SITE_OTHER): Payer: Medicare Other | Admitting: Pediatrics

## 2018-02-06 ENCOUNTER — Encounter: Payer: Self-pay | Admitting: Pediatrics

## 2018-02-06 ENCOUNTER — Telehealth: Payer: Self-pay | Admitting: Pediatrics

## 2018-02-06 VITALS — BP 132/80 | HR 70 | Temp 98.1°F | Resp 18 | Ht 62.0 in | Wt 158.0 lb

## 2018-02-06 DIAGNOSIS — R5383 Other fatigue: Secondary | ICD-10-CM | POA: Diagnosis not present

## 2018-02-06 DIAGNOSIS — M5481 Occipital neuralgia: Secondary | ICD-10-CM | POA: Diagnosis not present

## 2018-02-06 MED ORDER — GABAPENTIN 400 MG PO CAPS: 400.0000 mg | ORAL_CAPSULE | Freq: Three times a day (TID) | ORAL | 1 refills | Status: DC

## 2018-02-06 NOTE — Progress Notes (Signed)
Subjective:   Patient ID: [name], female    DOB: [birth year], 83 y.o.   MRN: [id number] CC: Fatigue  HPI: [name] is a 83 y.o. female   Headaches happening 2-3 times a week. Lasts usually a few minutes to 15-20 minutes.  Always on the left side of her head, skin feels sore after hurts.  Laying down on that side helps.  Gabapentin was increased to 400 mg twice a day 2 weeks ago.  She does not think the gabapentin is making her feel tired.  Fatigue: Ongoing for the last couple weeks.  Not taking naps during the day.  Lights out about 10:30 at night, wakes up several times throughout the night mostly to use the bathroom.  Sometimes anxious at night.  Wakes up a little before 6 most days.  Son is not waking her up as regularly anymore at night so she has been able to get some rest.  Was recently on prednisone, finished last week for COPD exacerbation.  Breathing now back to normal.  Back to taking Symbicort, no longer needing albuterol.  Relevant past medical, surgical, family and social history reviewed. Allergies and medications reviewed and updated. Social History   Tobacco Use  Smoking Status Current Some Day Smoker  . Packs/day: 0.10  . Years: 60.00  . Pack years: 6.00  . Types: Cigarettes  Smokeless Tobacco Never Used  Tobacco Comment   2 per day   ROS: Per HPI   Objective:    BP 132/80   Pulse 70   Temp 98.1 F (36.7 C) (Oral)   Ht _0  (1.575 m)   Wt 158 lb (71.7 kg)   BMI 28.90 kg/m   Wt Readings from Last 3 Encounters:  02/06/18 158 lb (71.7 kg)  01/22/18 156 lb 6.4 oz (70.9 kg)  01/18/18 151 lb 9.6 oz (68.8 kg)    Gen: NAD, alert, cooperative with exam, NCAT EYES: EOMI, no conjunctival injection, or no icterus ENT: OP without erythema LYMPH: no cervical LAD CV: NRRR, normal S1/S2, no murmur, distal pulses 2+ b/l Resp: CTABL, no wheezes, normal WOB Abd: +BS, soft, NTND.  Ext: No edema, warm Neuro: Alert and oriented, strength equal b/l UE and  LE, coordination grossly normal MSK: normal muscle bulk  Assessment & Plan:  [name] was seen today for fatigue.  Diagnoses and all orders for this visit:  Occipital neuralgia of left side She is still quite bothered by the headaches.  Will increase to 3 times a day.  Has an appointment coming up with neurology. -     gabapentin (NEURONTIN) 400 MG capsule; Take 1 capsule (400 mg total) by mouth 3 (three) times daily.  Other fatigue Prednisone may been disrupting sleep starting 3 weeks ago, has been off of it for about a week.  Up going to the bathroom at night, sometimes feeling anxious or worrying. Discussed sleep hygiene to help improve quality of sleep at night--getting to bed regular time, turning TV off 1 hr before bed, taking gabapentin at 8pm, keeping bedroom dark, cool, quiet, using sound machines if able, stop drinking fluids at dinner time. She is already on two medicines to treat mood/anxiety, may need to change to alternate in future if anxiety seems to be what is bothering her the most. Will get blood work. -     CBC with Differential/Platelet -     CMP14+EGFR -     Vitamin B12   Follow up plan: Return in about 6  weeks (around 03/20/2018). Assunta Found, MD [place]

## 2018-02-07 LAB — CBC WITH DIFFERENTIAL/PLATELET
BASOS ABS: 0 10*3/uL (ref 0.0–0.2)
Basos: 0 %
EOS (ABSOLUTE): 0.3 10*3/uL (ref 0.0–0.4)
Eos: 4 %
Hematocrit: 41.7 % (ref 34.0–46.6)
Hemoglobin: 13.5 g/dL (ref 11.1–15.9)
Immature Grans (Abs): 0 10*3/uL (ref 0.0–0.1)
Immature Granulocytes: 0 %
LYMPHS ABS: 2.2 10*3/uL (ref 0.7–3.1)
Lymphs: 30 %
MCH: 29.7 pg (ref 26.6–33.0)
MCHC: 32.4 g/dL (ref 31.5–35.7)
MCV: 92 fL (ref 79–97)
Monocytes Absolute: 0.3 10*3/uL (ref 0.1–0.9)
Monocytes: 4 %
NEUTROS ABS: 4.6 10*3/uL (ref 1.4–7.0)
Neutrophils: 62 %
PLATELETS: 285 10*3/uL (ref 150–450)
RBC: 4.55 x10E6/uL (ref 3.77–5.28)
RDW: 13.7 % (ref 12.3–15.4)
WBC: 7.3 10*3/uL (ref 3.4–10.8)

## 2018-02-07 LAB — CMP14+EGFR
ALBUMIN: 4.1 g/dL (ref 3.5–4.7)
ALK PHOS: 84 IU/L (ref 39–117)
ALT: 18 IU/L (ref 0–32)
AST: 25 IU/L (ref 0–40)
Albumin/Globulin Ratio: 1.9 (ref 1.2–2.2)
BILIRUBIN TOTAL: 0.3 mg/dL (ref 0.0–1.2)
BUN / CREAT RATIO: 20 (ref 12–28)
BUN: 22 mg/dL (ref 8–27)
CHLORIDE: 106 mmol/L (ref 96–106)
CO2: 24 mmol/L (ref 20–29)
Calcium: 9 mg/dL (ref 8.7–10.3)
Creatinine, Ser: 1.11 mg/dL — ABNORMAL HIGH (ref 0.57–1.00)
GFR calc Af Amer: 54 mL/min/{1.73_m2} — ABNORMAL LOW (ref >59)
GFR calc non Af Amer: 47 mL/min/{1.73_m2} — ABNORMAL LOW (ref >59)
GLUCOSE: 77 mg/dL (ref 65–99)
Globulin, Total: 2.2 g/dL (ref 1.5–4.5)
Potassium: 4.3 mmol/L (ref 3.5–5.2)
Sodium: 145 mmol/L — ABNORMAL HIGH (ref 134–144)
Total Protein: 6.3 g/dL (ref 6.0–8.5)

## 2018-02-07 LAB — VITAMIN B12: Vitamin B-12: 1457 pg/mL — ABNORMAL HIGH (ref 232–1245)

## 2018-02-10 NOTE — Telephone Encounter (Signed)
Faxed

## 2018-02-11 ENCOUNTER — Other Ambulatory Visit: Payer: Self-pay

## 2018-02-11 ENCOUNTER — Encounter (HOSPITAL_COMMUNITY): Payer: Self-pay | Admitting: *Deleted

## 2018-02-11 NOTE — Progress Notes (Signed)
[name] denies chest pain, no shortness of breath at rest.  All of patient's medication are in a prepared pack,. I instructed her to not take any medications.

## 2018-02-12 ENCOUNTER — Ambulatory Visit (HOSPITAL_COMMUNITY): Payer: Medicare Other | Admitting: Anesthesiology

## 2018-02-12 ENCOUNTER — Ambulatory Visit (HOSPITAL_COMMUNITY)
Admission: RE | Admit: 2018-02-12 | Discharge: 2018-02-13 | Disposition: A | Discharge status: Home / Self Care | Payer: Medicare Other | Source: Ambulatory Visit | Attending: Orthopaedic Surgery | Admitting: Orthopaedic Surgery

## 2018-02-12 ENCOUNTER — Encounter (HOSPITAL_COMMUNITY)
Admission: RE | Disposition: A | Discharge status: Home / Self Care | Payer: Self-pay | Source: Ambulatory Visit | Attending: Orthopaedic Surgery

## 2018-02-12 ENCOUNTER — Encounter (HOSPITAL_COMMUNITY): Payer: Self-pay

## 2018-02-12 DIAGNOSIS — M7521 Bicipital tendinitis, right shoulder: Secondary | ICD-10-CM | POA: Diagnosis not present

## 2018-02-12 DIAGNOSIS — F17210 Nicotine dependence, cigarettes, uncomplicated: Secondary | ICD-10-CM | POA: Insufficient documentation

## 2018-02-12 DIAGNOSIS — I10 Essential (primary) hypertension: Secondary | ICD-10-CM | POA: Insufficient documentation

## 2018-02-12 DIAGNOSIS — Z885 Allergy status to narcotic agent status: Secondary | ICD-10-CM | POA: Diagnosis not present

## 2018-02-12 DIAGNOSIS — Z7982 Long term (current) use of aspirin: Secondary | ICD-10-CM | POA: Diagnosis not present

## 2018-02-12 DIAGNOSIS — F329 Major depressive disorder, single episode, unspecified: Secondary | ICD-10-CM | POA: Insufficient documentation

## 2018-02-12 DIAGNOSIS — Z888 Allergy status to other drugs, medicaments and biological substances status: Secondary | ICD-10-CM | POA: Insufficient documentation

## 2018-02-12 DIAGNOSIS — M19011 Primary osteoarthritis, right shoulder: Secondary | ICD-10-CM | POA: Diagnosis not present

## 2018-02-12 DIAGNOSIS — Z79899 Other long term (current) drug therapy: Secondary | ICD-10-CM | POA: Diagnosis not present

## 2018-02-12 DIAGNOSIS — M8580 Other specified disorders of bone density and structure, unspecified site: Secondary | ICD-10-CM | POA: Diagnosis not present

## 2018-02-12 DIAGNOSIS — J45909 Unspecified asthma, uncomplicated: Secondary | ICD-10-CM | POA: Diagnosis not present

## 2018-02-12 DIAGNOSIS — M75101 Unspecified rotator cuff tear or rupture of right shoulder, not specified as traumatic: Secondary | ICD-10-CM | POA: Diagnosis not present

## 2018-02-12 DIAGNOSIS — K219 Gastro-esophageal reflux disease without esophagitis: Secondary | ICD-10-CM | POA: Insufficient documentation

## 2018-02-12 DIAGNOSIS — F419 Anxiety disorder, unspecified: Secondary | ICD-10-CM | POA: Insufficient documentation

## 2018-02-12 DIAGNOSIS — E782 Mixed hyperlipidemia: Secondary | ICD-10-CM | POA: Diagnosis not present

## 2018-02-12 HISTORY — DX: Headache: R51

## 2018-02-12 HISTORY — PX: SHOULDER ARTHROSCOPY: SHX128

## 2018-02-12 HISTORY — DX: Dyspnea, unspecified: R06.00

## 2018-02-12 HISTORY — DX: Headache, unspecified: R51.9

## 2018-02-12 SURGERY — ARTHROSCOPY, SHOULDER
Anesthesia: General | Site: Shoulder | Laterality: Right

## 2018-02-12 MED ORDER — CARVEDILOL 6.25 MG PO TABS
6.2500 mg | ORAL_TABLET | Freq: Two times a day (BID) | ORAL | Status: DC
  Administered 2018-02-13: 6.25 mg via ORAL
  Filled 2018-02-12: qty 1

## 2018-02-12 MED ORDER — HYDROCODONE-ACETAMINOPHEN 5-325 MG PO TABS: 1.0000 | ORAL_TABLET | Freq: Four times a day (QID) | ORAL | 0 refills | Status: DC | PRN

## 2018-02-12 MED ORDER — AZELASTINE HCL 0.1 % NA SOLN
2.0000 | Freq: Two times a day (BID) | NASAL | Status: DC | PRN
  Filled 2018-02-12: qty 30

## 2018-02-12 MED ORDER — BIOTIN 5 MG PO TABS: 1.0000 | ORAL_TABLET | Freq: Every day | ORAL | Status: DC

## 2018-02-12 MED ORDER — CARVEDILOL 3.125 MG PO TABS
6.2500 mg | ORAL_TABLET | Freq: Once | ORAL | Status: AC
  Administered 2018-02-12: 6.25 mg via ORAL

## 2018-02-12 MED ORDER — DOCUSATE SODIUM 100 MG PO CAPS
100.0000 mg | ORAL_CAPSULE | Freq: Two times a day (BID) | ORAL | Status: DC
  Administered 2018-02-12 – 2018-02-13 (×2): 100 mg via ORAL
  Filled 2018-02-12 (×2): qty 1

## 2018-02-12 MED ORDER — LOSARTAN POTASSIUM 25 MG PO TABS
25.0000 mg | ORAL_TABLET | Freq: Every day | ORAL | Status: DC
  Administered 2018-02-13: 25 mg via ORAL
  Filled 2018-02-12: qty 1

## 2018-02-12 MED ORDER — SODIUM CHLORIDE 0.9 % IV SOLN
INTRAVENOUS | Status: DC | PRN
  Administered 2018-02-12: 50 ug/min via INTRAVENOUS

## 2018-02-12 MED ORDER — DEXAMETHASONE SODIUM PHOSPHATE 10 MG/ML IJ SOLN
INTRAMUSCULAR | Status: AC
  Filled 2018-02-12: qty 1

## 2018-02-12 MED ORDER — FENTANYL CITRATE (PF) 100 MCG/2ML IJ SOLN
INTRAMUSCULAR | Status: AC
  Administered 2018-02-12: 50 ug via INTRAVENOUS
  Filled 2018-02-12: qty 2

## 2018-02-12 MED ORDER — FENTANYL CITRATE (PF) 100 MCG/2ML IJ SOLN: 25.0000 ug | INTRAMUSCULAR | Status: DC | PRN

## 2018-02-12 MED ORDER — PROPOFOL 10 MG/ML IV BOLUS
INTRAVENOUS | Status: AC
  Filled 2018-02-12: qty 20

## 2018-02-12 MED ORDER — BUPIVACAINE HCL (PF) 0.25 % IJ SOLN
INTRAMUSCULAR | Status: DC | PRN
  Administered 2018-02-12: 25 mL

## 2018-02-12 MED ORDER — GLYCOPYRROLATE 0.2 MG/ML IJ SOLN
INTRAMUSCULAR | Status: DC | PRN
  Administered 2018-02-12: 0.2 mg via INTRAVENOUS

## 2018-02-12 MED ORDER — ONDANSETRON HCL 4 MG PO TABS: 4.0000 mg | ORAL_TABLET | Freq: Four times a day (QID) | ORAL | Status: DC | PRN

## 2018-02-12 MED ORDER — RALOXIFENE HCL 60 MG PO TABS
60.0000 mg | ORAL_TABLET | Freq: Every day | ORAL | Status: DC
  Administered 2018-02-13: 60 mg via ORAL
  Filled 2018-02-12: qty 1

## 2018-02-12 MED ORDER — VITAMIN B-12 1000 MCG PO TABS
1000.0000 ug | ORAL_TABLET | Freq: Every day | ORAL | Status: DC
  Administered 2018-02-13: 1000 ug via ORAL
  Filled 2018-02-12: qty 1

## 2018-02-12 MED ORDER — CEFAZOLIN SODIUM-DEXTROSE 2-3 GM-%(50ML) IV SOLR
INTRAVENOUS | Status: DC | PRN
  Administered 2018-02-12: 2 g via INTRAVENOUS

## 2018-02-12 MED ORDER — AZELASTINE-FLUTICASONE 137-50 MCG/ACT NA SUSP: 1.0000 | Freq: Every day | NASAL | Status: DC

## 2018-02-12 MED ORDER — DEXAMETHASONE SODIUM PHOSPHATE 10 MG/ML IJ SOLN
INTRAMUSCULAR | Status: DC | PRN
  Administered 2018-02-12: 8 mg via INTRAVENOUS

## 2018-02-12 MED ORDER — POTASSIUM CHLORIDE CRYS ER 20 MEQ PO TBCR
20.0000 meq | EXTENDED_RELEASE_TABLET | Freq: Every day | ORAL | Status: DC
  Administered 2018-02-13: 20 meq via ORAL
  Filled 2018-02-12: qty 1

## 2018-02-12 MED ORDER — SODIUM CHLORIDE 0.45 % IV SOLN
INTRAVENOUS | Status: DC
  Administered 2018-02-12: 22:00:00 via INTRAVENOUS

## 2018-02-12 MED ORDER — PHENOL 1.4 % MT LIQD: 1.0000 | OROMUCOSAL | Status: DC | PRN

## 2018-02-12 MED ORDER — LACTATED RINGERS IV SOLN
INTRAVENOUS | Status: DC
  Administered 2018-02-12: 13:00:00 via INTRAVENOUS

## 2018-02-12 MED ORDER — STERILE WATER FOR IRRIGATION IR SOLN
Status: DC | PRN
  Administered 2018-02-12: 1000 mL

## 2018-02-12 MED ORDER — PANTOPRAZOLE SODIUM 40 MG PO TBEC
80.0000 mg | DELAYED_RELEASE_TABLET | Freq: Every day | ORAL | Status: DC
  Administered 2018-02-13: 80 mg via ORAL
  Filled 2018-02-12: qty 2

## 2018-02-12 MED ORDER — PRAVASTATIN SODIUM 40 MG PO TABS: 80.0000 mg | ORAL_TABLET | Freq: Every day | ORAL | Status: DC

## 2018-02-12 MED ORDER — FLUMAZENIL 0.5 MG/5ML IV SOLN
INTRAVENOUS | Status: AC
  Filled 2018-02-12: qty 5

## 2018-02-12 MED ORDER — CARVEDILOL 3.125 MG PO TABS
ORAL_TABLET | ORAL | Status: AC
  Administered 2018-02-12: 6.25 mg via ORAL
  Filled 2018-02-12: qty 2

## 2018-02-12 MED ORDER — LORATADINE 10 MG PO TABS
10.0000 mg | ORAL_TABLET | Freq: Every day | ORAL | Status: DC
  Administered 2018-02-13: 10 mg via ORAL
  Filled 2018-02-12: qty 1

## 2018-02-12 MED ORDER — ONDANSETRON HCL 4 MG/2ML IJ SOLN
INTRAMUSCULAR | Status: AC
  Filled 2018-02-12: qty 2

## 2018-02-12 MED ORDER — HYDROCODONE-ACETAMINOPHEN 5-325 MG PO TABS
1.0000 | ORAL_TABLET | ORAL | Status: DC | PRN
  Administered 2018-02-13 (×2): 1 via ORAL
  Filled 2018-02-12 (×2): qty 1

## 2018-02-12 MED ORDER — MIDAZOLAM HCL 2 MG/2ML IJ SOLN
INTRAMUSCULAR | Status: AC
  Filled 2018-02-12: qty 2

## 2018-02-12 MED ORDER — MENTHOL 3 MG MT LOZG: 1.0000 | LOZENGE | OROMUCOSAL | Status: DC | PRN

## 2018-02-12 MED ORDER — AEROCHAMBER MV MISC: Freq: Two times a day (BID) | Status: DC

## 2018-02-12 MED ORDER — FENTANYL CITRATE (PF) 250 MCG/5ML IJ SOLN
INTRAMUSCULAR | Status: DC | PRN
  Administered 2018-02-12: 50 ug via INTRAVENOUS

## 2018-02-12 MED ORDER — ONDANSETRON HCL 4 MG/2ML IJ SOLN
INTRAMUSCULAR | Status: DC | PRN
  Administered 2018-02-12: 4 mg via INTRAVENOUS

## 2018-02-12 MED ORDER — METOCLOPRAMIDE HCL 5 MG PO TABS: 5.0000 mg | ORAL_TABLET | Freq: Three times a day (TID) | ORAL | Status: DC | PRN

## 2018-02-12 MED ORDER — ASPIRIN EC 81 MG PO TBEC
81.0000 mg | DELAYED_RELEASE_TABLET | Freq: Every day | ORAL | Status: DC
  Administered 2018-02-13: 81 mg via ORAL
  Filled 2018-02-12: qty 1

## 2018-02-12 MED ORDER — ROCURONIUM 10MG/ML (10ML) SYRINGE FOR MEDFUSION PUMP - OPTIME
INTRAVENOUS | Status: DC | PRN
  Administered 2018-02-12: 50 mg via INTRAVENOUS

## 2018-02-12 MED ORDER — METOCLOPRAMIDE HCL 5 MG/ML IJ SOLN: 5.0000 mg | Freq: Three times a day (TID) | INTRAMUSCULAR | Status: DC | PRN

## 2018-02-12 MED ORDER — ADULT MULTIVITAMIN W/MINERALS CH
ORAL_TABLET | Freq: Every day | ORAL | Status: DC
  Administered 2018-02-13: 1 via ORAL
  Filled 2018-02-12: qty 1

## 2018-02-12 MED ORDER — SUGAMMADEX SODIUM 200 MG/2ML IV SOLN
INTRAVENOUS | Status: DC | PRN
  Administered 2018-02-12: 200 mg via INTRAVENOUS

## 2018-02-12 MED ORDER — PROPOFOL 10 MG/ML IV BOLUS
INTRAVENOUS | Status: DC | PRN
  Administered 2018-02-12: 70 mg via INTRAVENOUS

## 2018-02-12 MED ORDER — BUPROPION HCL ER (SR) 150 MG PO TB12
150.0000 mg | ORAL_TABLET | Freq: Two times a day (BID) | ORAL | Status: DC
  Administered 2018-02-12 – 2018-02-13 (×2): 150 mg via ORAL
  Filled 2018-02-12 (×2): qty 1

## 2018-02-12 MED ORDER — SUFENTANIL CITRATE 50 MCG/ML IV SOLN
INTRAVENOUS | Status: AC
  Filled 2018-02-12: qty 1

## 2018-02-12 MED ORDER — ESCITALOPRAM OXALATE 10 MG PO TABS: 10.0000 mg | ORAL_TABLET | Freq: Every day | ORAL | Status: DC

## 2018-02-12 MED ORDER — GABAPENTIN 400 MG PO CAPS
400.0000 mg | ORAL_CAPSULE | Freq: Three times a day (TID) | ORAL | Status: DC
  Administered 2018-02-12 – 2018-02-13 (×2): 400 mg via ORAL
  Filled 2018-02-12 (×2): qty 1

## 2018-02-12 MED ORDER — SODIUM CHLORIDE 0.9 % IR SOLN
Status: DC | PRN
  Administered 2018-02-12: 1

## 2018-02-12 MED ORDER — ONDANSETRON HCL 4 MG/2ML IJ SOLN: 4.0000 mg | Freq: Four times a day (QID) | INTRAMUSCULAR | Status: DC | PRN

## 2018-02-12 MED ORDER — MOMETASONE FURO-FORMOTEROL FUM 200-5 MCG/ACT IN AERO
2.0000 | INHALATION_SPRAY | Freq: Two times a day (BID) | RESPIRATORY_TRACT | Status: DC
  Administered 2018-02-13: 2 via RESPIRATORY_TRACT
  Filled 2018-02-12 (×2): qty 8.8

## 2018-02-12 MED ORDER — LIDOCAINE HCL (CARDIAC) PF 100 MG/5ML IV SOSY
PREFILLED_SYRINGE | INTRAVENOUS | Status: DC | PRN
  Administered 2018-02-12: 100 mg via INTRAVENOUS

## 2018-02-12 MED ORDER — FENTANYL CITRATE (PF) 250 MCG/5ML IJ SOLN
INTRAMUSCULAR | Status: AC
  Filled 2018-02-12: qty 5

## 2018-02-12 MED ORDER — FUROSEMIDE 20 MG PO TABS
20.0000 mg | ORAL_TABLET | Freq: Every day | ORAL | Status: DC
  Administered 2018-02-13: 20 mg via ORAL
  Filled 2018-02-12: qty 1

## 2018-02-12 MED ORDER — FENTANYL CITRATE (PF) 100 MCG/2ML IJ SOLN
50.0000 ug | Freq: Once | INTRAMUSCULAR | Status: AC
  Administered 2018-02-12: 50 ug via INTRAVENOUS

## 2018-02-12 SURGICAL SUPPLY — 59 items
BENZOIN TINCTURE PRP APPL 2/3 (GAUZE/BANDAGES/DRESSINGS) ×3 IMPLANT
BLADE CUDA 4.2 (BLADE) ×6 IMPLANT
BLADE CUTTER GATOR 3.5 (BLADE) IMPLANT
BLADE SURG 11 STRL SS (BLADE) ×6 IMPLANT
BUR OVAL 6.0 (BURR) ×3 IMPLANT
CANNULA ACUFLEX KIT 5X76 (CANNULA) ×6 IMPLANT
CLOSURE WOUND 1/2 X4 (GAUZE/BANDAGES/DRESSINGS)
COVER SURGICAL LIGHT HANDLE (MISCELLANEOUS) ×3 IMPLANT
COVER WAND RF STERILE (DRAPES) ×3 IMPLANT
DECANTER SPIKE VIAL GLASS SM (MISCELLANEOUS) ×3 IMPLANT
DRAPE IMP U-DRAPE 54X76 (DRAPES) ×6 IMPLANT
DRAPE ORTHO SPLIT 77X108 STRL (DRAPES) ×4
DRAPE STERI 35X30 U-POUCH (DRAPES) ×3 IMPLANT
DRAPE SURG ORHT 6 SPLT 77X108 (DRAPES) ×2 IMPLANT
DRAPE U-SHAPE 47X51 STRL (DRAPES) ×6 IMPLANT
DRSG PAD ABDOMINAL 8X10 ST (GAUZE/BANDAGES/DRESSINGS) ×9 IMPLANT
DURAPREP 26ML APPLICATOR (WOUND CARE) ×3 IMPLANT
ELECT REM PT RETURN 9FT ADLT (ELECTROSURGICAL) ×3
ELECTRODE REM PT RTRN 9FT ADLT (ELECTROSURGICAL) ×1 IMPLANT
FILTER STRAW FLUID ASPIR (MISCELLANEOUS) ×3 IMPLANT
GAUZE SPONGE 4X4 12PLY STRL (GAUZE/BANDAGES/DRESSINGS) ×3 IMPLANT
GLOVE BIOGEL PI IND STRL 8 (GLOVE) ×1 IMPLANT
GLOVE BIOGEL PI INDICATOR 8 (GLOVE) ×2
GLOVE ORTHO TXT STRL SZ7.5 (GLOVE) ×6 IMPLANT
GOWN STRL REUS W/ TWL LRG LVL3 (GOWN DISPOSABLE) ×2 IMPLANT
GOWN STRL REUS W/TWL 2XL LVL3 (GOWN DISPOSABLE) ×3 IMPLANT
GOWN STRL REUS W/TWL LRG LVL3 (GOWN DISPOSABLE) ×4
KIT BASIN OR (CUSTOM PROCEDURE TRAY) ×3 IMPLANT
KIT TURNOVER KIT B (KITS) ×3 IMPLANT
MANIFOLD NEPTUNE II (INSTRUMENTS) ×3 IMPLANT
NDL SUT 6 .5 CRC .975X.05 MAYO (NEEDLE) ×1 IMPLANT
NEEDLE HYPO 25X1 1.5 SAFETY (NEEDLE) ×3 IMPLANT
NEEDLE MAYO TAPER (NEEDLE) ×2
NEEDLE SPNL 18GX3.5 QUINCKE PK (NEEDLE) ×6 IMPLANT
NS IRRIG 1000ML POUR BTL (IV SOLUTION) ×3 IMPLANT
PACK SHOULDER (CUSTOM PROCEDURE TRAY) ×3 IMPLANT
PAD ARMBOARD 7.5X6 YLW CONV (MISCELLANEOUS) ×6 IMPLANT
RESTRAINT HEAD UNIVERSAL NS (MISCELLANEOUS) ×3 IMPLANT
SET ARTHROSCOPY TUBING (MISCELLANEOUS) ×2
SET ARTHROSCOPY TUBING LN (MISCELLANEOUS) ×1 IMPLANT
SLING ARM IMMOBILIZER MED (SOFTGOODS) ×3 IMPLANT
SPONGE LAP 4X18 RFD (DISPOSABLE) ×6 IMPLANT
STRIP CLOSURE SKIN 1/2X4 (GAUZE/BANDAGES/DRESSINGS) IMPLANT
SUCTION FRAZIER HANDLE 10FR (MISCELLANEOUS) ×2
SUCTION TUBE FRAZIER 10FR DISP (MISCELLANEOUS) ×1 IMPLANT
SUT ETHILON 3 0 PS 1 (SUTURE) ×3 IMPLANT
SUT VIC AB 0 CT2 27 (SUTURE) IMPLANT
SUT VIC AB 2-0 CT1 27 (SUTURE) ×6
SUT VIC AB 2-0 CT1 TAPERPNT 27 (SUTURE) ×3 IMPLANT
SUT VICRYL 4-0 PS2 18IN ABS (SUTURE) ×3 IMPLANT
SYR 20CC LL (SYRINGE) ×6 IMPLANT
SYR 3ML LL SCALE MARK (SYRINGE) ×3 IMPLANT
SYR CONTROL 10ML LL (SYRINGE) ×3 IMPLANT
SYR TB 1ML LUER SLIP (SYRINGE) ×3 IMPLANT
TOWEL OR 17X24 6PK STRL BLUE (TOWEL DISPOSABLE) ×3 IMPLANT
TOWEL OR 17X26 10 PK STRL BLUE (TOWEL DISPOSABLE) ×3 IMPLANT
TUBING ARTHRO INFLOW-ONLY STRL (TUBING) ×3 IMPLANT
WAND HAND CNTRL MULTIVAC 90 (MISCELLANEOUS) IMPLANT
WATER STERILE IRR 1000ML POUR (IV SOLUTION) ×3 IMPLANT

## 2018-02-12 NOTE — Anesthesia Postprocedure Evaluation (Signed)
Anesthesia Post Note  Patient: [name]  Procedure(s) Performed: RIGHT SHOULDER ARTHROSCOPY, BICEPS DEBRIDEMENT, REMOVAL OF LOOSE BODIES (Right Shoulder)     Patient location during evaluation: PACU Anesthesia Type: General and Regional Level of consciousness: awake and alert Pain management: pain level controlled Vital Signs Assessment: post-procedure vital signs reviewed and stable Respiratory status: spontaneous breathing, nonlabored ventilation, respiratory function stable and patient connected to nasal cannula oxygen Cardiovascular status: blood pressure returned to baseline and stable Postop Assessment: no apparent nausea or vomiting Anesthetic complications: no    Last Vitals:  Vitals:   02/12/18 1400 02/12/18 1430  BP: (!) 153/79 126/73  Pulse: 79 71  Resp: 16 19  Temp: (!) 36.3 C   SpO2: 98% 98%    Last Pain:  Vitals:   02/12/18 1430  TempSrc:   PainSc: 0-No pain                 Kaydan Wilhoite,W. EDMOND

## 2018-02-12 NOTE — H&P (Signed)
[name] is an 83 y.o. female.   Chief Complaint: Painful right shoulder with multiple loose bodies, glenohumeral arthritis and severe biceps tendinopathy HPI: 83 year old female with long-standing right shoulder pain catching grabbing and pain anteriorly over the biceps tendon bothers her with reaching dressing, drying off after she takes a bath.  Surgery had been scheduled back in April but was delayed due to pulmonary problems.  MRI scan showed multiple loose bodies and severe intra-articular biceps tendinopathy.  She had failed previous injection treatment.   MRI scan right shoulder 07/12/2017 CLINICAL DATA:  Chronic right shoulder pain and limited range of motion.  EXAM: MRI OF THE RIGHT SHOULDER WITHOUT CONTRAST  TECHNIQUE: Multiplanar, multisequence MR imaging of the shoulder was performed. No intravenous contrast was administered.  COMPARISON:  Right shoulder x-rays dated November 22, 2016.  FINDINGS: Rotator cuff: Moderate rotator cuff tendinosis, sparing the teres minor tendon. Low-grade partial-thickness articular surface tear of the distal superior subscapularis tendon fibers.  Muscles: No atrophy or abnormal signal of the muscles of the rotator cuff.  Biceps long head: Intact and normally positioned. Severe intra-articular tendinosis.  Acromioclavicular Joint: Prior distal clavicle resection and acromioplasty. Type I acromion. No significant subacromial/subdeltoid bursal fluid.  Glenohumeral Joint: Severe glenohumeral osteoarthritis with large areas of full-thickness cartilage loss over the glenoid and humeral head. Prominent subchondral cystic change and bony remodeling. Small joint effusion with synovitis. Multiple intra-articular loose bodies in the subscapularis recess and biceps tendon sheath.  Labrum:  Diffuse circumferential degenerative tearing of the labrum.  Bones:  No acute fracture or dislocation.  Other: None.  IMPRESSION: 1.  Moderate rotator cuff tendinosis. Low-grade partial-thickness articular surface tear of the distal subscapularis tendon. 2. Severe intra-articular biceps tendinosis. 3. Severe glenohumeral osteoarthritis with multiple loose bodies.   Electronically Signed   By: Obie Dredge M.D.   On: 07/12/2017 10:47  Past Medical History:  Diagnosis Date  . Allergic rhinitis   . Anxiety   . Arthritis   . Asthma   . CAP (community acquired pneumonia) 11/26/2017  . Depression   . Dyspnea    with exertion   . Elevated troponin 04/05/2016  . Essential hypertension, benign   . GERD (gastroesophageal reflux disease)   . Headache    occiptal neuralgia - left   . History of blood transfusion    "related to miscarriage"  . Left bundle branch block   . Memory loss   . Mixed hyperlipidemia   . Occipital neuralgia of left side   . Osteopenia   . 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 Urinary urgency     Past Surgical History:  Procedure Laterality Date  . APPENDECTOMY    . CARDIAC CATHETERIZATION  2010   Normal coronaries  . CATARACT EXTRACTION W/PHACO  04/16/2011   Procedure: CATARACT EXTRACTION PHACO AND INTRAOCULAR LENS PLACEMENT (IOC);  Surgeon: Susa Simmonds;  Location: [facility];  Service: Ophthalmology;  Laterality: Left;  CDE=11.35  . CATARACT EXTRACTION W/PHACO  01/28/2012   Procedure: CATARACT EXTRACTION PHACO AND INTRAOCULAR LENS PLACEMENT (IOC);  Surgeon: Susa Simmonds, MD;  Location: [facility];  Service: Ophthalmology;  Laterality: Right;  CDI:8.15  . CYSTO WITH HYDRODISTENSION  05/29/2012   Procedure: CYSTOSCOPY/HYDRODISTENSION;  Surgeon: Martina Sinner, MD;  Location: [facility];  Service: Urology;  Laterality: N/A;  INSTILLATION OF MARCAINE AND PYRIDIUM   . DILATION AND CURETTAGE OF UTERUS  <hysterectomy"  . HAMMER TOE SURGERY Bilateral 11/22/11  [facility] [place]  . LAPAROSCOPIC CHOLECYSTECTOMY  1991  . LUMBAR LAMINECTOMY  2011   . LUMBAR LAMINECTOMY/DECOMPRESSION MICRODISCECTOMY Left 06/06/2012   Procedure: LUMBAR LAMINECTOMY/DECOMPRESSION MICRODISCECTOMY 1 LEVEL;  Surgeon: Maeola Harman, MD;  Location: [facility];  Service: Neurosurgery;  Laterality: Left;  Left Lumbar two-three Laminectomy for resection of synovial cyst  . SHOULDER OPEN ROTATOR CUFF REPAIR Right 2011  . TONSILLECTOMY    . TOTAL HIP ARTHROPLASTY Right 03/13/2013   Procedure: TOTAL HIP ARTHROPLASTY;  Surgeon: Nestor Lewandowsky, MD;  Location: [facility];  Service: Orthopedics;  Laterality: Right;  Marland Kitchen VAGINAL HYSTERECTOMY      Family History  Problem Relation Age of Onset  . Diabetes Father   . Diabetes Brother   . Cancer Brother 19       colon  . Diabetes Brother   . Diabetes Brother   . Hypertension Sister   . Diabetes Sister   . Diabetes Sister   . Diabetes Other   . Allergies Other   . Diabetes Son   . Colon cancer Neg Hx   . Anesthesia problems Neg Hx   . Hypotension Neg Hx   . Malignant hyperthermia Neg Hx   . Pseudochol deficiency Neg Hx    Social History:  reports that she has been smoking cigarettes. She has a 6.00 pack-year smoking history. She has never used smokeless tobacco. She reports that she drank alcohol. She reports that she does not use drugs.  Allergies:  Allergies  Allergen Reactions  . Lisinopril-Hydrochlorothiazide Swelling, Other (See Comments) and Cough    Lip swelling and cough. Stopped by ENT. Patient is on Losartan without any issues   . Codeine Itching and Swelling    eye irritation (prescribed percocet)  . Penicillins Itching and Swelling    PATIENT HAS HAD A PCN REACTION WITH IMMEDIATE RASH, FACIAL/TONGUE/THROAT SWELLING, SOB, OR LIGHTHEADEDNESS WITH HYPOTENSION:  #  #  YES  #  #  Has patient had a PCN reaction causing severe rash involving mucus membranes or skin necrosis: No Has patient had a PCN reaction that required hospitalization No Has patient had a PCN reaction occurring within the last 10 years:  No eye irritation  . Aricept [Donepezil Hcl] Nausea And Vomiting  . Namenda [Memantine Hcl] Nausea And Vomiting    Medications Prior to Admission  Medication Sig Dispense Refill  . aspirin EC 81 MG tablet Take 81 mg by mouth daily.    Marland Kitchen azelastine (ASTELIN) 0.1 % nasal spray Place 2 sprays into both nostrils 2 (two) times daily. Use in each nostril as directed (Patient taking differently: Place 2 sprays into both nostrils 2 (two) times daily as needed for allergies. Use in each nostril as directed) 30 mL 12  . Biotin 5 MG TABS Take 1 tablet by mouth daily.    . budesonide-formoterol (SYMBICORT) 160-4.5 MCG/ACT inhaler Inhale 2 puffs into the lungs 2 (two) times daily. 1 Inhaler 0  . buPROPion (WELLBUTRIN SR) 150 MG 12 hr tablet TAKE (1) TABLET TWICE A DAY. (Patient taking differently: Take 150 mg by mouth 2 (two) times daily. ) 60 tablet 2  . carvedilol (COREG) 6.25 MG tablet TAKE  (1)  TABLET TWICE A DAY WITH MEALS (BREAKFAST AND SUPPER) (Patient taking differently: Take 6.25 mg by mouth 2 (two) times daily with a meal. ) 60 tablet 2  . esomeprazole (NEXIUM) 40 MG capsule Take 1 capsule (40 mg total) by mouth daily before breakfast. 90 capsule 4  . furosemide (LASIX) 20  MG tablet Take 1 tablet (20 mg total) by mouth daily. Pt to take 40 mg for 2 days then decrease to 20 mg daily (Patient taking differently: Take 20 mg by mouth daily. ) 90 tablet 3  . gabapentin (NEURONTIN) 400 MG capsule Take 1 capsule (400 mg total) by mouth 3 (three) times daily. 90 capsule 1  . loratadine (CLARITIN) 10 MG tablet Take 1 tablet (10 mg total) by mouth daily. 30 tablet 5  . losartan (COZAAR) 50 MG tablet Take 1/2 tablet (25 mg total) by mouth daily. (Patient taking differently: Take 25 mg by mouth daily. Take 1/2 tablet (25 mg total) by mouth daily.) 45 tablet 2  . Multiple Vitamins-Minerals (CENTRUM SILVER PO) Take 1 tablet by mouth daily.    . potassium chloride SA (K-DUR,KLOR-CON) 20 MEQ tablet Take 1 tablet  (20 mEq total) by mouth daily. 90 tablet 3  . raloxifene (EVISTA) 60 MG tablet TAKE 1 TABLET DAILY 90 tablet 0  . vitamin B-12 (CYANOCOBALAMIN) 1000 MCG tablet Take 1,000 mcg by mouth daily.    Marland Kitchen DYMISTA 137-50 MCG/ACT SUSP One spray per nostril twice a day (Patient not taking: Reported on 02/10/2018) 23 g 5  . escitalopram (LEXAPRO) 10 MG tablet Take 1 tablet (10 mg total) by mouth daily. (Patient not taking: Reported on 02/10/2018) 30 tablet 2  . pravastatin (PRAVACHOL) 80 MG tablet Take 1 tablet (80 mg total) by mouth daily. (Patient not taking: Reported on 02/10/2018) 90 tablet 3  . Spacer/Aero-Holding Chambers (AEROCHAMBER MV) inhaler Use as instructed 1 each 0    No results found for this or any previous visit (from the past 48 hour(s)). No results found.  ROS system positive for chronic right shoulder pain multiple loose bodies biceps tendinopathy.  Hyperlipidemia, hypertension, cardiomyopathy, left bundle branch block, anxiety and depression, lumbar disc degeneration, previous L4-5 fusion.  Right total hip arthroplasty.  History of hepatitis B.  Cardiac cath echo 11/27/2017 55 to 60% ejection fraction.  Normal wall motion.  No significant regurg or stenosis.  Blood pressure (!) 176/79, pulse (!) 52, temperature 98.3 F (36.8 C), temperature source Oral, resp. rate 18, height 5\' 2"  (1.575 m), weight 67.6 kg, SpO2 95 %. Physical Exam  Constitutional: She appears well-developed and well-nourished.  HENT:  Head: Normocephalic.  Eyes: Pupils are equal, round, and reactive to light.  Neck: Normal range of motion.  Cardiovascular: Normal rate.  Respiratory: Effort normal. She has no wheezes.  GI: Soft.  Skin: Skin is warm and dry. No rash noted. No erythema.  Psychiatric: She has a normal mood and affect. Her behavior is normal.          Biceps long head: Intact and normally positioned. Severe intra-articular tendinosis.    Assessment/Plan Right shoulder arthroscopy for  removal multiple loose bodies and biceps tendon debridement for severe biceps tendinopathy.  Procedure was discussed with patient in detail this spring. Today we reviewed the operative plan, risks of surgery , plan overnight stay to to general medical history and medical pre-op recommendations. Plan discharge in AM.   Eldred Manges, MD 02/12/2018, 11:55 AM

## 2018-02-12 NOTE — Progress Notes (Signed)
Verbal surgical orders received and read back from Dr. [name]. Consent: Right Shoulder Arthroscopy and Debridement. Labs CBC and BMP, with antibiotic of Ancef 2gm.

## 2018-02-12 NOTE — Anesthesia Preprocedure Evaluation (Addendum)
Anesthesia Evaluation  Patient identified by MRN, date of birth, ID band Patient awake    Reviewed: Allergy & Precautions, H&P , NPO status , Patient's Chart, lab work & pertinent test results, reviewed documented beta blocker date and time   Airway Mallampati: I  TM Distance: >3 FB Neck ROM: Full    Dental no notable dental hx. (+) Edentulous Lower, Dental Advisory Given   Pulmonary asthma , COPD,  COPD inhaler, Current Smoker,    Pulmonary exam normal breath sounds clear to auscultation       Cardiovascular hypertension, Pt. on medications and Pt. on home beta blockers + DOE  + dysrhythmias  Rhythm:Regular Rate:Normal     Neuro/Psych  Headaches, Anxiety Depression Dementia    GI/Hepatic Neg liver ROS, GERD  Medicated and Controlled,  Endo/Other  negative endocrine ROS  Renal/GU negative Renal ROS  negative genitourinary   Musculoskeletal  (+) Arthritis , Osteoarthritis,    Abdominal   Peds  Hematology negative hematology ROS (+)   Anesthesia Other Findings   Reproductive/Obstetrics negative OB ROS                            Anesthesia Physical Anesthesia Plan  ASA: III  Anesthesia Plan: General   Post-op Pain Management:  Regional for Post-op pain   Induction: Intravenous  PONV Risk Score and Plan: 3 and Ondansetron and Treatment may vary due to age or medical condition  Airway Management Planned: Oral ETT  Additional Equipment:   Intra-op Plan:   Post-operative Plan: Extubation in OR  Informed Consent: I have reviewed the patients History and Physical, chart, labs and discussed the procedure including the risks, benefits and alternatives for the proposed anesthesia with the patient or authorized representative who has indicated his/her understanding and acceptance.   Dental advisory given  Plan Discussed with: CRNA  Anesthesia Plan Comments:         Anesthesia  Quick Evaluation

## 2018-02-12 NOTE — Op Note (Addendum)
Preop diagnosis: Right shoulder glenohumeral arthritis multiple loose bodies, severe intra-articular biceps tendinopathy and rotator cuff tear.  Postop diagnosis: Same  Procedure right shoulder arthroscopy extensive debridement of rotator cuff biceps tendon debridement and release, chondroplasty removal multiple loose bodies.  Surgeon: Annell Greening, MD  Anesthesia: General  EBL: Minimal.  Procedure after induction of anesthesia Ancef prophylaxis timeout procedure with the shoulder frame in beachchair position shoulder and arm to the wrist was prepped with DuraPrep the usual arthroscopic sheets drapes pouch impervious stockinette Covan was applied timeout procedure completed.  Scope was introduced from a posterior portal after infiltration of the skin a total of 4 cc of Marcaine with epinephrine was used for the anterior posterior portal.  In for the biceps tendon long rod was used for establishing an anterior portal and the screw and blue-colored cannula was placed anteriorly.  Shoulder was then sequentially inspected.  There is multiple loose bodies in the axillary recess.  Grade IV chondromalacia glenohumeral joint.  Complex tearing of the labrum anterior superior, superior and posterior with intact inferior glenoid labral complex.  Using the Cuda shaver straight flat baskets and grabber multiple loose pieces were removed.  Chondroplasty was performed for some areas that were remaining of grade 3 that were rough which was very limited.  Most of the entire glenoid was grade 4 changes as well as most humeral head.  Biceps showed partial tearing thickening and enlargement approximately twice normal size which was trimmed and debrided with a straight flat baskets and then the shaver was used to debride the remaining portion of the intra-articular biceps.  Undersurface rotator cuff showed tearing of the supraspinatus which was trimmed and smoothed.  There was some villous synovitis present no axillary recess.   Shoulder was taken through range of motion there was some adherence with tight capsule and shoulder was gently abducted under general anesthesia for arms up overhead.  Inferior labrum was probed as well and was intact.  Continue debridement to make sure all remaining loose pieces were removed and then shoulder was suctioned dry 3-0 nylon placed in the arthroscopic portals followed by 4 x 4's and tape.  Patient tolerated the procedure well transferred to recovery room in stable condition.  Sling was applied for postoperative immobilization.

## 2018-02-12 NOTE — Discharge Instructions (Signed)
Remove dressing the day after surgery.  You can shower and then apply Band-Aid over the sutures in front and in the back of your right shoulder.  You can use the sling as needed remove the sling to work on gentle shoulder range of motion.  Avoid heavy lifting with the right arm.  See Dr. [name] in 1 week.

## 2018-02-12 NOTE — Interval H&P Note (Signed)
History and Physical Interval Note:  02/12/2018 1:39 PM  [name]  has presented today for surgery, with the diagnosis of right shoulder biceps tendinitis  The various methods of treatment have been discussed with the patient and family. After consideration of risks, benefits and other options for treatment, the patient has consented to  Procedure(s): RIGHT SHOULDER ARTHROSCOPY, BICEPS DEBRIDEMENT, REMOVAL OF LOOSE BODIES (Right) as a surgical intervention .  The patient's history has been reviewed, patient examined, no change in status, stable for surgery.  I have reviewed the patient's chart and labs.  Questions were answered to the patient's satisfaction.     Eldred Manges

## 2018-02-12 NOTE — Anesthesia Procedure Notes (Signed)
Procedure Name: Intubation Date/Time: 02/12/2018 12:52 PM Performed by: Claris Che, CRNA Pre-anesthesia Checklist: Patient identified, Emergency Drugs available, Suction available, Patient being monitored and Timeout performed Patient Re-evaluated:Patient Re-evaluated prior to induction Oxygen Delivery Method: Circle system utilized Preoxygenation: Pre-oxygenation with 100% oxygen Induction Type: IV induction Ventilation: Mask ventilation without difficulty Laryngoscope Size: Mac and 3 Grade View: Grade II Tube type: Oral Tube size: 7.5 mm Number of attempts: 1 Airway Equipment and Method: Stylet Placement Confirmation: ETT inserted through vocal cords under direct vision,  positive ETCO2 and breath sounds checked- equal and bilateral Secured at: 23 cm Tube secured with: Tape Dental Injury: Teeth and Oropharynx as per pre-operative assessment

## 2018-02-12 NOTE — Plan of Care (Signed)

## 2018-02-12 NOTE — Transfer of Care (Signed)
Immediate Anesthesia Transfer of Care Note  Patient: [name]  Procedure(s) Performed: RIGHT SHOULDER ARTHROSCOPY, BICEPS DEBRIDEMENT, REMOVAL OF LOOSE BODIES (Right Shoulder)  Patient Location: PACU  Anesthesia Type:GA combined with regional for post-op pain  Level of Consciousness: awake, alert  and oriented  Airway & Oxygen Therapy: Patient Spontanous Breathing and Patient connected to nasal cannula oxygen  Post-op Assessment: Report given to RN and Post -op Vital signs reviewed and stable  Post vital signs: Reviewed and stable  Last Vitals:  Vitals Value Taken Time  BP 153/79 02/12/2018  2:00 PM  Temp    Pulse 73 02/12/2018  2:02 PM  Resp 13 02/12/2018  2:02 PM  SpO2 100 % 02/12/2018  2:02 PM  Vitals shown include unvalidated device data.  Last Pain:  Vitals:   02/12/18 1230  TempSrc:   PainSc: 0-No pain      Patients Stated Pain Goal: 3 (02/12/18 1214)  Complications: No apparent anesthesia complications

## 2018-02-12 NOTE — Anesthesia Procedure Notes (Signed)
Anesthesia Regional Block: Interscalene brachial plexus block   Pre-Anesthetic Checklist: ,, timeout performed, Correct Patient, Correct Site, Correct Laterality, Correct Procedure, Correct Position, site marked, Risks and benefits discussed, pre-op evaluation,  At surgeon's request and post-op pain management  Laterality: Right  Prep: Maximum Sterile Barrier Precautions used, chloraprep       Needles:  Injection technique: Single-shot  Needle Type: Echogenic Stimulator Needle     Needle Length: 5cm  Needle Gauge: 22     Additional Needles:   Procedures:, nerve stimulator,,, ultrasound used (permanent image in chart),,,,  Narrative:  Start time: 02/12/2018 12:19 PM End time: 02/12/2018 12:29 PM Injection made incrementally with aspirations every 5 mL. Anesthesiologist: Gaynelle Adu, MD  Additional Notes: 2% Lidocaine skin wheel.

## 2018-02-13 ENCOUNTER — Telehealth (INDEPENDENT_AMBULATORY_CARE_PROVIDER_SITE_OTHER): Payer: Self-pay | Admitting: Radiology

## 2018-02-13 ENCOUNTER — Encounter (HOSPITAL_COMMUNITY): Payer: Self-pay | Admitting: Orthopaedic Surgery

## 2018-02-13 DIAGNOSIS — M19011 Primary osteoarthritis, right shoulder: Secondary | ICD-10-CM | POA: Diagnosis not present

## 2018-02-13 MED ORDER — HYDROCODONE-ACETAMINOPHEN 5-325 MG PO TABS: 1.0000 | ORAL_TABLET | ORAL | 0 refills | Status: DC | PRN

## 2018-02-13 MED ORDER — PANTOPRAZOLE SODIUM 40 MG PO TBEC: 80.0000 mg | DELAYED_RELEASE_TABLET | Freq: Every day | ORAL | Status: DC

## 2018-02-13 MED ORDER — MOMETASONE FURO-FORMOTEROL FUM 200-5 MCG/ACT IN AERO: 2.0000 | INHALATION_SPRAY | Freq: Two times a day (BID) | RESPIRATORY_TRACT | Status: DC

## 2018-02-13 MED ORDER — LOSARTAN POTASSIUM 25 MG PO TABS: 25.0000 mg | ORAL_TABLET | Freq: Every day | ORAL | Status: DC

## 2018-02-13 MED ORDER — LORATADINE 10 MG PO TABS: 10.0000 mg | ORAL_TABLET | Freq: Every day | ORAL | Status: DC

## 2018-02-13 NOTE — Telephone Encounter (Signed)
Received call from patient's daughter that she needs stronger pain medication after surgery yesterday. Please call [phone] or [phone] to advise.    Daughter:  Renard Hamper

## 2018-02-13 NOTE — Telephone Encounter (Signed)
noted 

## 2018-02-13 NOTE — Progress Notes (Signed)
   Subjective: 1 Day Post-Op Procedure(s) (LRB): RIGHT SHOULDER ARTHROSCOPY, BICEPS DEBRIDEMENT, REMOVAL OF LOOSE BODIES (Right) Patient reports pain as mild.    Objective: Vital signs in last 24 hours: Temp:  [97.3 F (36.3 C)-98.5 F (36.9 C)] 98.4 F (36.9 C) (10/17 0551) Pulse Rate:  [42-124] 56 (10/17 0551) Resp:  [11-25] 16 (10/17 0551) BP: (62-176)/(47-79) 116/60 (10/17 0551) SpO2:  [91 %-100 %] 95 % (10/17 0551) Weight:  [67.6 kg] 67.6 kg (10/16 1054)  Intake/Output from previous day: 10/16 0701 - 10/17 0700 In: 1572.1 [P.O.:720; I.V.:852.1] Out: 50 [Blood:50] Intake/Output this shift: No intake/output data recorded.  No results for input(s): HGB in the last 72 hours. No results for input(s): WBC, RBC, HCT, PLT in the last 72 hours. No results for input(s): NA, K, CL, CO2, BUN, CREATININE, GLUCOSE, CALCIUM in the last 72 hours. No results for input(s): LABPT, INR in the last 72 hours.  Neurologically intact, block has worn off No results found.  Assessment/Plan: 1 Day Post-Op Procedure(s) (LRB): RIGHT SHOULDER ARTHROSCOPY, BICEPS DEBRIDEMENT, REMOVAL OF LOOSE BODIES (Right) Plan:   Dressing change , discharge home . Sling in PRN use. Office one week . Rx on chart for pain.   Eldred Manges 02/13/2018, 7:06 AM

## 2018-02-13 NOTE — Telephone Encounter (Signed)
I called discussed.  Ice , sling , recliner and rest.

## 2018-02-13 NOTE — Progress Notes (Signed)
Patient complained of pain in the right shoulder. PRN PO med administered. Patient called back 30 mins later asking for pain medicine. Patient educated that the PO meds can take an hour to take effect.  15 minutes later, patient family member called from [facility] stating that the patient is in pain.  Family member informed by nurse that the patient has been medicated for pain. Family member began asking questions about the surgical procedure, discharge instructions and what type of medication the patient is taking. Nurse informed family member that per regulations, we are not allowed to discuss patient health information over the phone and suggested she speak with the patient for that information.  Family member began to get upset stating that her mom has the beginning signs of dementia.  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 Leadership made aware. Student nurse also present during this phone exchange. Patient alert and oriented. Calm. Called the MD, he ordered an extra dose of pain medicine. Nursing will continue to follow.

## 2018-02-19 ENCOUNTER — Ambulatory Visit: Payer: Medicare Other | Admitting: Pediatrics

## 2018-02-19 ENCOUNTER — Inpatient Hospital Stay (INDEPENDENT_AMBULATORY_CARE_PROVIDER_SITE_OTHER): Payer: Medicare Other | Admitting: Orthopaedic Surgery

## 2018-02-19 ENCOUNTER — Other Ambulatory Visit: Payer: Self-pay | Admitting: Pediatrics

## 2018-02-20 ENCOUNTER — Encounter (INDEPENDENT_AMBULATORY_CARE_PROVIDER_SITE_OTHER): Payer: Self-pay | Admitting: Orthopaedic Surgery

## 2018-02-20 ENCOUNTER — Ambulatory Visit (INDEPENDENT_AMBULATORY_CARE_PROVIDER_SITE_OTHER): Payer: Medicare Other | Admitting: Orthopaedic Surgery

## 2018-02-20 ENCOUNTER — Inpatient Hospital Stay (INDEPENDENT_AMBULATORY_CARE_PROVIDER_SITE_OTHER): Payer: Medicare Other | Admitting: Orthopaedic Surgery

## 2018-02-20 VITALS — BP 173/88 | HR 77 | Ht 64.0 in | Wt 149.0 lb

## 2018-02-20 DIAGNOSIS — M67813 Other specified disorders of tendon, right shoulder: Secondary | ICD-10-CM

## 2018-02-20 NOTE — Progress Notes (Signed)
Patient returns post right shoulder arthroscopy performed on 02/12/2018.  Sutures are removed.  Arthroscopic portals look good.  She had glenohumeral arthritis and removal of multiple chondral pieces that were floating in the joint as well as some trimming of synovitis and debridement of the severe intra-articular biceps tendinopathy with partial tearing in the biceps tendon was released.  She can wean herself out of the sling and would like to resume work on 03/10/2018 work slip given.  I can check her back again on a as needed basis she will call if she is having problems.

## 2018-02-24 ENCOUNTER — Ambulatory Visit (INDEPENDENT_AMBULATORY_CARE_PROVIDER_SITE_OTHER): Payer: Medicare Other | Admitting: *Deleted

## 2018-02-24 ENCOUNTER — Encounter: Payer: Self-pay | Admitting: *Deleted

## 2018-02-24 VITALS — BP 144/66 | HR 60 | Ht 62.0 in | Wt 154.0 lb

## 2018-02-24 DIAGNOSIS — Z0000 Encounter for general adult medical examination without abnormal findings: Secondary | ICD-10-CM | POA: Diagnosis not present

## 2018-02-24 NOTE — Progress Notes (Addendum)
Subjective:   [name] is a 83 y.o. female who presents for a subsequent Medicare Annual Wellness Visit.  Patient Care Team: [name], MD as PCP - General (Pediatrics) Rachael Fee, MD (Gastroenterology) Rollene Rotunda, MD as Consulting Physician (Cardiology) Alfredo Martinez, MD as Consulting Physician (Urology) Kalman Shan, MD as Consulting Physician (Pulmonary Disease)  Hospitalizations, surgeries, and ER visits in previous 12 months No hospitalizations, ER visits, or surgeries this past year.   Review of Systems    Patient reports that her overall health is unchanged compared to last year.  Cardiac Risk Factors include: advanced age (>28men, >109 women);sedentary lifestyle;smoking/ tobacco exposure;dyslipidemia   All other systems negative       Current Medications (verified) Outpatient Encounter Medications as of 02/24/2018  Medication Sig  . aspirin EC 81 MG tablet Take 81 mg by mouth daily.  Marland Kitchen azelastine (ASTELIN) 0.1 % nasal spray Place 2 sprays into both nostrils 2 (two) times daily. Use in each nostril as directed (Patient taking differently: Place 2 sprays into both nostrils 2 (two) times daily as needed for allergies. Use in each nostril as directed)  . Biotin 5 MG TABS Take 1 tablet by mouth daily.  . budesonide-formoterol (SYMBICORT) 160-4.5 MCG/ACT inhaler Inhale 2 puffs into the lungs 2 (two) times daily.  Marland Kitchen buPROPion (WELLBUTRIN SR) 150 MG 12 hr tablet TAKE (1) TABLET TWICE A DAY.  . carvedilol (COREG) 6.25 MG tablet TAKE  (1)  TABLET TWICE A DAY WITH MEALS (BREAKFAST AND SUPPER) (Patient taking differently: Take 6.25 mg by mouth 2 (two) times daily with a meal. )  . DYMISTA 137-50 MCG/ACT SUSP One spray per nostril twice a day  . esomeprazole (NEXIUM) 40 MG capsule Take 1 capsule (40 mg total) by mouth daily before breakfast.  . gabapentin (NEURONTIN) 400 MG capsule Take 1 capsule (400 mg total) by mouth 3 (three) times daily.  Marland Kitchen  HYDROcodone-acetaminophen (NORCO) 5-325 MG tablet Take 1 tablet by mouth every 6 (six) hours as needed for moderate pain.  Marland Kitchen HYDROcodone-acetaminophen (NORCO/VICODIN) 5-325 MG tablet Take 1 tablet by mouth every 4 (four) hours as needed for moderate pain.  Marland Kitchen loratadine (CLARITIN) 10 MG tablet Take 1 tablet (10 mg total) by mouth daily.  Marland Kitchen losartan (COZAAR) 50 MG tablet Take 1/2 tablet (25 mg total) by mouth daily. (Patient taking differently: Take 25 mg by mouth daily. Take 1/2 tablet (25 mg total) by mouth daily.)  . Multiple Vitamins-Minerals (CENTRUM SILVER PO) Take 1 tablet by mouth daily.  . potassium chloride SA (K-DUR,KLOR-CON) 20 MEQ tablet Take 1 tablet (20 mEq total) by mouth daily.  . pravastatin (PRAVACHOL) 80 MG tablet Take 1 tablet (80 mg total) by mouth daily.  . raloxifene (EVISTA) 60 MG tablet TAKE 1 TABLET DAILY  . Spacer/Aero-Holding Chambers (AEROCHAMBER MV) inhaler Use as instructed  . vitamin B-12 (CYANOCOBALAMIN) 1000 MCG tablet Take 1,000 mcg by mouth daily.  . furosemide (LASIX) 20 MG tablet Take 1 tablet (20 mg total) by mouth daily. Pt to take 40 mg for 2 days then decrease to 20 mg daily (Patient taking differently: Take 20 mg by mouth daily. )   No facility-administered encounter medications on file as of 02/24/2018.     Allergies (verified) Lisinopril-hydrochlorothiazide; Codeine; Penicillins; Aricept [donepezil hcl]; and Namenda [memantine hcl]   History: Past Medical History:  Diagnosis Date  . Allergic rhinitis   . Anxiety   . Arthritis   . Asthma   . CAP (community  acquired pneumonia) 11/26/2017  . Depression   . Dyspnea    with exertion   . Elevated troponin 04/05/2016  . Essential hypertension, benign   . GERD (gastroesophageal reflux disease)   . Headache    occiptal neuralgia - left   . History of blood transfusion    "related to miscarriage"  . Left bundle branch block   . Memory loss   . Mixed hyperlipidemia   . Occipital neuralgia of left  side   . Osteopenia   . 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 Urinary urgency    Past Surgical History:  Procedure Laterality Date  . APPENDECTOMY    . BACK SURGERY    . CARDIAC CATHETERIZATION  2010   Normal coronaries  . CATARACT EXTRACTION W/PHACO  04/16/2011   Procedure: CATARACT EXTRACTION PHACO AND INTRAOCULAR LENS PLACEMENT (IOC);  Surgeon: Susa Simmonds;  Location: [facility];  Service: Ophthalmology;  Laterality: Left;  CDE=11.35  . CATARACT EXTRACTION W/PHACO  01/28/2012   Procedure: CATARACT EXTRACTION PHACO AND INTRAOCULAR LENS PLACEMENT (IOC);  Surgeon: Susa Simmonds, MD;  Location: [facility];  Service: Ophthalmology;  Laterality: Right;  CDI:8.15  . CYSTO WITH HYDRODISTENSION  05/29/2012   Procedure: CYSTOSCOPY/HYDRODISTENSION;  Surgeon: Martina Sinner, MD;  Location: [facility];  Service: Urology;  Laterality: N/A;  INSTILLATION OF MARCAINE AND PYRIDIUM   . DILATION AND CURETTAGE OF UTERUS  <hysterectomy"  . HAMMER TOE SURGERY Bilateral 11/22/11   [facility], [name]  . JOINT REPLACEMENT    . LAPAROSCOPIC CHOLECYSTECTOMY  1991  . LUMBAR LAMINECTOMY  2011  . LUMBAR LAMINECTOMY/DECOMPRESSION MICRODISCECTOMY Left 06/06/2012   Procedure: LUMBAR LAMINECTOMY/DECOMPRESSION MICRODISCECTOMY 1 LEVEL;  Surgeon: Maeola Harman, MD;  Location: [facility];  Service: Neurosurgery;  Laterality: Left;  Left Lumbar two-three Laminectomy for resection of synovial cyst  . SHOULDER ARTHROSCOPY Right 02/12/2018   SHOULDER ARTHROSCOPY, BICEPS DEBRIDEMENT, REMOVAL OF LOOSE BODIES  . SHOULDER ARTHROSCOPY Right 02/12/2018   Procedure: RIGHT SHOULDER ARTHROSCOPY, BICEPS DEBRIDEMENT, REMOVAL OF LOOSE BODIES;  Surgeon: Eldred Manges, MD;  Location: [facility];  Service: Orthopedics;  Laterality: Right;  . SHOULDER OPEN ROTATOR CUFF REPAIR Right 2011  . TONSILLECTOMY    . TOTAL HIP ARTHROPLASTY Right 03/13/2013   Procedure: TOTAL HIP ARTHROPLASTY;  Surgeon: Nestor Lewandowsky, MD;  Location: [facility];  Service: Orthopedics;  Laterality: Right;  Marland Kitchen VAGINAL HYSTERECTOMY     Family History  Problem Relation Age of Onset  . Diabetes Father   . Diabetes Brother   . Cancer Brother 39       colon  . Diabetes Brother   . Diabetes Brother   . Hypertension Sister   . Diabetes Sister   . Diabetes Sister   . Diabetes Other   . Allergies Other   . Diabetes Son   . Colon cancer Neg Hx   . Anesthesia problems Neg Hx   . Hypotension Neg Hx   . Malignant hyperthermia Neg Hx   . Pseudochol deficiency Neg Hx    Social History   Socioeconomic History  . Marital status: Widowed    Spouse name: Not on file  . Number of children: 5  . Years of education: 8th grade  . Highest education level: Not on file  Occupational History  . Occupation: Worked in Geneticist, molecular for 30 years    Employer: RETIRED    Comment: Retired  . Occupation: Part time care giver  Social Needs  . Financial resource strain: Somewhat hard  . Food insecurity:    Worry: Sometimes true    Inability: Never true  . Transportation needs:    Medical: No    Non-medical: No  Tobacco Use  . Smoking status: Current Some Day Smoker    Years: 60.00    Types: Cigarettes  . Smokeless tobacco: Never Used  . Tobacco comment: 1- 2 cigs a day-  1 pkg will  last 2 weeks  Substance and Sexual Activity  . Alcohol use: Not Currently    Comment: 11/27/2017 "drank some in my 20s"  . Drug use: No  . Sexual activity: Not Currently  Lifestyle  . Physical activity:    Days per week: 0 days    Minutes per session: 0 min  . Stress: Only a little  Relationships  . Social connections:    Talks on phone: More than three times a week    Gets together: More than three times a week    Attends religious service: More than 4 times per year    Active member of club or organization: Yes    Attends meetings of clubs or organizations: More than 4 times per year    Relationship status: Widowed  Other  Topics Concern  . Not on file  Social History Narrative   Ms Leys is divorced. Her adult son lives with her and he is disabled due to bilateral AKA due to diabetes.  She helps to provide care to him. She has three adult daughters that live in the [place] area. She talks with them daily and they come to visit once or twice a year. She has 7 grandchildren also.      Clinical Intake:     Pain : No/denies pain Pain Score: 0-No pain     Nutritional Status: BMI 25 -29 Overweight(Has two meals a day. Breakfast and supper and may have a snack in between. Maily drinks water. ) Diabetes: No  How often do you need to have someone help you when you read instructions, pamphlets, or other written materials from your doctor or pharmacy?: 3 - Sometimes     Information entered by :: Demetrios Loll, RN   Activities of Daily Living In your present state of health, do you have any difficulty performing the following activities: 02/24/2018 02/12/2018  Hearing? - -  Vision? - -  Difficulty concentrating or making decisions? - -  Walking or climbing stairs? - -  Dressing or bathing? - -  Doing errands, shopping? - N  Quarry manager and eating ? N -  Using the Toilet? N -  In the past six months, have you accidently leaked urine? Y -  Do you have problems with loss of bowel control? N -  Managing your Medications? N -  Managing your Finances? N -  Housekeeping or managing your Housekeeping? N -  Some recent data might be hidden     Exercise Current Exercise Habits: The patient does not participate in regular exercise at present, Exercise limited by: None identified   Depression Screen PHQ 2/9 Scores 02/06/2018 01/03/2018 12/04/2017 11/16/2017 11/11/2017 10/29/2017 10/01/2017  PHQ - 2 Score 0 3 0 0 0 0 0  PHQ- 9 Score - 15 - - - - -     Fall Risk Fall Risk  02/28/2018 02/06/2018 01/03/2018 12/04/2017 11/16/2017  Falls in the past year? (No Data) No No No No  Comment No steps in home. Has a tub without a  seat or rails. Has to get up to go to the bathroom once a night. Keeps the floor clear and has a light on.  - - - -  Number falls in past yr: - - - - -  Injury with Fall? - - - - -  Follow up - - - - -     Objective:    Today's Vitals   02/24/18 0925 02/24/18 1654  BP: (!) 144/66   Pulse: 60   Weight: 154 lb (69.9 kg)   Height: 5\' 2"  (1.575 m)   PainSc:  0-No pain   Body mass index is 28.17 kg/m.  Advanced Directives 02/12/2018 02/12/2018 11/27/2017 02/14/2017 08/21/2016 04/05/2016 04/05/2016  Does Patient Have a Medical Advance Directive? - No No No No No No  Would patient like information on creating a medical advance directive? Yes (Inpatient - patient defers creating a medical advance directive at this time) No - Patient declined Yes (Inpatient - patient defers creating a medical advance directive at this time) Yes (MAU/Ambulatory/Procedural Areas - Information given) - Yes (MAU/Ambulatory/Procedural Areas - Information given) No - Patient declined  Pre-existing out of facility DNR order (yellow form or pink MOST form) - - - - - - -    Hearing/Vision  No hearing or vision deficits noted during visit.  Cognitive Function: MMSE - Mini Mental State Exam 06/12/2017 02/14/2017 06/04/2016 02/21/2016 10/26/2015  Orientation to time 3 3 3 5 2   Orientation to Place 3 3 2 5 5   Registration 3 3 3 3 3   Attention/ Calculation 4 0 2 4 3   Recall 2 2 2  0 2  Language- name 2 objects 2 2 2 2 2   Language- repeat 1 0 1 1 1   Language- follow 3 step command 3 3 2 3 3   Language- read & follow direction 1 1 0 1 1  Write a sentence 1 1 1 1 1   Copy design 1 0 1 1 0  Total score 24 18 19 26 23      6CIT Screen 02/24/2018  What Year? 0 points  What month? 3 points  What time? 0 points  Count back from 20 0 points  Months in reverse 4 points  Repeat phrase 0 points  Total Score 7   Normal Cognitive Function Screening: Yes    Immunizations and Health Maintenance Immunization History    Administered Date(s) Administered  . Influenza Split 01/29/2011, 01/29/2012, 01/26/2013  . Influenza, High Dose Seasonal PF 02/15/2015, 01/23/2016, 02/08/2017, 01/18/2018  . Influenza,inj,Quad PF,6+ Mos 12/07/2013  . Pneumococcal Conjugate-13 07/07/2014  . Pneumococcal Polysaccharide-23 01/28/2010  . Zoster 10/25/2011   There are no preventive care reminders to display for this patient. Health Maintenance  Topic Date Due  . TETANUS/TDAP  02/07/2019 (Originally 11/28/2016)  . DEXA SCAN  02/15/2019  . INFLUENZA VACCINE  Completed  . PNA vac Low Risk Adult  Completed        Assessment:   This is a routine wellness examination for [name].    Plan:    Goals    . Exercise 150 minutes per week (moderate activity)     Walk for 30 minutes daily        Health Maintenance Recommendations: Td vaccine-postponed due to cost   Additional Screening Recommendations: Lung: Low Dose CT Chest recommended if Age 20-80 years, 30 pack-year currently smoking OR have quit w/in 15years. Patient does not qualify. Hepatitis C Screening recommended: no  Today's Orders No orders of the defined types were placed  in this encounter.   Keep f/u with [name], MD and any other specialty appointments you may have Continue current medications Move carefully to avoid falls. Use assistive devices like a cane or walker if needed. Aim for at least 150 minutes of moderate activity a week. This can be done with chair exercises if necessary. Read or work on puzzles daily Stay connected with friends and family  I have personally reviewed and noted the following in the patient's chart:   . Medical and social history . Use of alcohol, tobacco or illicit drugs  . Current medications and supplements . Functional ability and status . Nutritional status . Physical activity . Advanced directives . List of other physicians . Hospitalizations, surgeries, and ER visits in previous 12  months . Vitals . Screenings to include cognitive, depression, and falls . Referrals and appointments  In addition, I have reviewed and discussed with patient certain preventive protocols, quality metrics, and best practice recommendations. A written personalized care plan for preventive services as well as general preventive health recommendations were provided to patient.     Demetrios Loll, RN   02/28/2018   I have reviewed and agree with the above AWV documentation.   Mary-Margaret Daphine Deutscher, FNP

## 2018-02-24 NOTE — Patient Instructions (Addendum)
  Ms. [name] , Thank you for taking time to come for your Medicare Wellness Visit. I appreciate your ongoing commitment to your health goals. Please review the following plan we discussed and let me know if I can assist you in the future.   These are the goals we discussed: Goals    . Exercise 150 minutes per week (moderate activity)     Walk for 30 minutes daily       This is a list of the screening recommended for you and due dates:  Health Maintenance  Topic Date Due  . Tetanus Vaccine  02/07/2019*  . DEXA scan (bone density measurement)  02/15/2019  . Flu Shot  Completed  . Pneumonia vaccines  Completed  *Topic was postponed. The date shown is not the original due date.   I'm going to check on whether insurance will cover a shower seat.  [facility] [address] - Anyone in need of groceries can participate in our [employer]. Pick-up days are the 2nd and last Saturdays of each month from 9 am - 12 pm. There is fresh produce and deli items available every Saturday from 12:30-1:30 pm. Our mobile pantry is available in local communities. If you are a Investment banker, corporate and you are interested in hosting the [employer], let us know. For more information, please call [phone].  [facility] [address] - A warm welcome and a hot meal is made available at [facility]. Free hot lunches are served on Wednesday through Saturday from 12am-2pm at our [place] location. We also do an unofficial breakfast on Saturdays from 9-11am. Clients are served good food and offered great fellowship with caring members of the community.  [place] [employer] [phone] or [phone]  Mission Statement  [facility] [place] [facility] strives to provide  resources and activities to aid seniors, aged 18 or better, maintain an independent  lifestyle and remain involved in the community. Services focus on programs to enhance  the lives of the area's aging  population by providing information, opportunity for physical  and mental exercise, fun and fellowship.

## 2018-02-25 NOTE — Progress Notes (Signed)
@Patient  ID: [name], female    DOB: 05-04-1934, 83 y.o.   MRN: [id number]  Chief Complaint  Patient presents with  . Follow-up    Asthma     Referring provider: [name], MD  HPI:  83 year old female current smoker followed in our office for asthma  PMH: GERD, CHF (pEF) Smoker/ Smoking History: Current smoker. Few cigarettes a day. 6 pack year smoking history Maintenance:  Symbicort 160 Pt of: Dr. Isaiah Serge  02/26/2018  - Visit   83 year old female patient following up today.  Patient reports her breathing has been baseline and reports no issues.  Patient has been adherent to her Symbicort 160.  Patient denies any increased shortness of breath, wheezing, increased sputum production.  Patient did complete arm surgery. Has been doing well.  Patient reports that she is had close follow-up with primary care as well.  Patient does report that she is had a few more issues with heartburn recently.  Patient has no issues or concerns at today's office visit.    Tests:  07/19/2011-pulmonary function test- ratio 66, FEV1 116, mid flow reversibility after bronchodilator 12/12/2017-pulmonary function test- ratio 61, mid flow reversibility, DLCO 61 >>>Mild obstructive airways disease, moderate diffusion defect  11/28/2017-chest x-ray-radiologic, radiographic improvement since 2 days ago, mild atelectasis and patchy infiltrate in the lower lungs right more than left 11/26/2017- chest x-ray-mild interstitial prominence seen in the atypical infection less likely edema  11/26/2017-respiratory virus panel-none detected 11/07/2017-respiratory allergy panel- no elevations, IgE 18 11/07/2017-CBC with differential- eosinophils relative 5.7, eosinophils absolute 0.4 09/07/2017-proBNP-1147   11/28/2017-chest x-ray- radiographic improvement since 2 days ago, mild atelectasis/patchy infiltrate in the lower lungs right greater than left  11/27/2017-echocardiogram- LV ejection fraction 55 to 60%,  grade 1 diastolic dysfunction   Chart Review:  11/26/2017- hospitalization-COPD exacerbation >>>Discharge date 11/28/2017  FENO:  Lab Results  Component Value Date   NITRICOXIDE 43 12/24/2017    PFT: PFT Results Latest Ref Rng & Units 12/12/2017  FVC-Pre L 2.35  FVC-Predicted Pre % 136  FVC-Post L 2.57  FVC-Predicted Post % 149  Pre FEV1/FVC % % 61  Post FEV1/FCV % % 60  FEV1-Pre L 1.43  FEV1-Predicted Pre % 107  DLCO UNC% % 61  DLCO COR %Predicted % 79  TLC L 5.18  TLC % Predicted % 110  RV % Predicted % 126    Imaging: No results found.  Chart Review:    Specialty Problems      Pulmonary Problems   Allergic rhinitis    Qualifier: Diagnosis of  By: Roxan Hockey CMA, Jessica        Chronic cough    Med calendar 08/02/2011 .  CT sinus 06/2011 neg  MCT 06/2011 Positive        Cough   DOE (dyspnea on exertion)   Acute respiratory failure (HCC)   Acute respiratory failure with hypoxia (HCC)   Acute respiratory failure with hypoxia and hypercapnia (HCC)   Community acquired pneumonia   GOLD COPD 0 with Asthmatic Component     07/19/2011-pulmonary function test- ratio 66, FEV1 116, mid flow reversibility after bronchodilator, 12/12/2017-pulmonary function test- ratio 61, mid flow reversibility, DLCO 61 >>>Mild obstructive airways disease, moderate diffusion defect  11/07/2017-respiratory allergy panel- no elevations, IgE 18 11/07/2017-CBC with differential- eosinophils relative 5.7, eosinophils absolute 0.4  12/24/2017-FeNO-43         Allergies  Allergen Reactions  . Lisinopril-Hydrochlorothiazide Swelling, Other (See Comments) and Cough    Lip swelling and cough. Stopped  by ENT. Patient is on Losartan without any issues   . Codeine Itching and Swelling    eye irritation (prescribed percocet)  . Penicillins Itching and Swelling    PATIENT HAS HAD A PCN REACTION WITH IMMEDIATE RASH, FACIAL/TONGUE/THROAT SWELLING, SOB, OR LIGHTHEADEDNESS WITH HYPOTENSION:  #  #   YES  #  #  Has patient had a PCN reaction causing severe rash involving mucus membranes or skin necrosis: No Has patient had a PCN reaction that required hospitalization No Has patient had a PCN reaction occurring within the last 10 years: No eye irritation  . Aricept [Donepezil Hcl] Nausea And Vomiting  . Namenda [Memantine Hcl] Nausea And Vomiting    Immunization History  Administered Date(s) Administered  . Influenza Split 01/29/2011, 01/29/2012, 01/26/2013  . Influenza, High Dose Seasonal PF 02/15/2015, 01/23/2016, 02/08/2017, 01/18/2018  . Influenza,inj,Quad PF,6+ Mos 12/07/2013  . Pneumococcal Conjugate-13 07/07/2014  . Pneumococcal Polysaccharide-23 01/28/2010  . Zoster 10/25/2011    Past Medical History:  Diagnosis Date  . Allergic rhinitis   . Anxiety   . Arthritis   . Asthma   . CAP (community acquired pneumonia) 11/26/2017  . Depression   . Dyspnea    with exertion   . Elevated troponin 04/05/2016  . Essential hypertension, benign   . GERD (gastroesophageal reflux disease)   . Headache    occiptal neuralgia - left   . History of blood transfusion    "related to miscarriage"  . Left bundle branch block   . Memory loss   . Mixed hyperlipidemia   . Occipital neuralgia of left side   . Osteopenia   . 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 Urinary urgency     Tobacco History: Social History   Tobacco Use  Smoking Status Current Some Day Smoker  . Years: 60.00  . Types: Cigarettes  Smokeless Tobacco Never Used  Tobacco Comment   1- 2 cigs a day-  1 pkg will  last 2 weeks   Ready to quit: Not Answered Counseling given: Yes Comment: 1- 2 cigs a day-  1 pkg will  last 2 weeks  Smoking assessment and cessation counseling  Patient currently smoking: 1 to 2 cigarettes a day, pack last 2 weeks. I have advised the patient to quit/stop smoking as soon as possible due to high risk for multiple medical problems.  It will also be  very difficult for [place] to manage patient's  respiratory symptoms and status if we continue to expose her lungs to a known irritant.  We do not advise e-cigarettes as a form of stopping smoking.  Patient is not willing to quit smoking.  Patient is not willing to set quit date  I have advised the patient that we can assist and have options of nicotine replacement therapy, provided smoking cessation education today, provided smoking cessation counseling, and provided cessation resources.  Follow-up next office visit office visit for assessment of smoking cessation.   Smoking cessation counseling advised for: 7 min    Outpatient Encounter Medications as of 02/26/2018  Medication Sig  . aspirin EC 81 MG tablet Take 81 mg by mouth daily.  Marland Kitchen azelastine (ASTELIN) 0.1 % nasal spray Place 2 sprays into both nostrils 2 (two) times daily. Use in each nostril as directed (Patient taking differently: Place 2 sprays into both nostrils 2 (two) times daily as needed for allergies. Use in each nostril as directed)  . Biotin 5 MG TABS Take 1 tablet  by mouth daily.  . budesonide-formoterol (SYMBICORT) 160-4.5 MCG/ACT inhaler Inhale 2 puffs into the lungs 2 (two) times daily.  Marland Kitchen buPROPion (WELLBUTRIN SR) 150 MG 12 hr tablet TAKE (1) TABLET TWICE A DAY.  . carvedilol (COREG) 6.25 MG tablet TAKE  (1)  TABLET TWICE A DAY WITH MEALS (BREAKFAST AND SUPPER) (Patient taking differently: Take 6.25 mg by mouth 2 (two) times daily with a meal. )  . DYMISTA 137-50 MCG/ACT SUSP One spray per nostril twice a day  . esomeprazole (NEXIUM) 40 MG capsule Take 1 capsule (40 mg total) by mouth daily before breakfast.  . gabapentin (NEURONTIN) 400 MG capsule Take 1 capsule (400 mg total) by mouth 3 (three) times daily.  Marland Kitchen HYDROcodone-acetaminophen (NORCO) 5-325 MG tablet Take 1 tablet by mouth every 6 (six) hours as needed for moderate pain.  Marland Kitchen HYDROcodone-acetaminophen (NORCO/VICODIN) 5-325 MG tablet Take 1 tablet by mouth every 4  (four) hours as needed for moderate pain.  Marland Kitchen loratadine (CLARITIN) 10 MG tablet Take 1 tablet (10 mg total) by mouth daily.  Marland Kitchen losartan (COZAAR) 50 MG tablet Take 1/2 tablet (25 mg total) by mouth daily. (Patient taking differently: Take 25 mg by mouth daily. Take 1/2 tablet (25 mg total) by mouth daily.)  . Multiple Vitamins-Minerals (CENTRUM SILVER PO) Take 1 tablet by mouth daily.  . potassium chloride SA (K-DUR,KLOR-CON) 20 MEQ tablet Take 1 tablet (20 mEq total) by mouth daily.  . pravastatin (PRAVACHOL) 80 MG tablet Take 1 tablet (80 mg total) by mouth daily.  . raloxifene (EVISTA) 60 MG tablet TAKE 1 TABLET DAILY  . Spacer/Aero-Holding Chambers (AEROCHAMBER MV) inhaler Use as instructed  . vitamin B-12 (CYANOCOBALAMIN) 1000 MCG tablet Take 1,000 mcg by mouth daily.  . furosemide (LASIX) 20 MG tablet Take 1 tablet (20 mg total) by mouth daily. Pt to take 40 mg for 2 days then decrease to 20 mg daily (Patient taking differently: Take 20 mg by mouth daily. )   No facility-administered encounter medications on file as of 02/26/2018.     Review of Systems  Review of Systems  Constitutional: Positive for fatigue. Negative for chills, fever and unexpected weight change.  HENT: Negative for congestion, ear pain, postnasal drip, sinus pressure and sinus pain.   Respiratory: Positive for shortness of breath (occasional with exertion ). Negative for cough, chest tightness and wheezing.   Cardiovascular: Negative for chest pain and palpitations.  Gastrointestinal: Negative for blood in stool, diarrhea, nausea and vomiting.       +heartburn, adhearent to nexium   Genitourinary: Negative for dysuria, frequency and urgency.  Musculoskeletal:       Recovering well from arm surgery  Skin: Negative for color change.  Allergic/Immunologic: Negative for environmental allergies and food allergies.  Neurological: Negative for dizziness, light-headedness and headaches.  Psychiatric/Behavioral: Negative  for dysphoric mood. The patient is not nervous/anxious.   All other systems reviewed and are negative.    Physical Exam  BP 138/68 (BP Location: Left Arm, Cuff Size: Normal)   Pulse 70   Ht 5\' 2"  (1.575 m)   Wt 153 lb 12.8 oz (69.8 kg)   SpO2 95%   BMI 28.13 kg/m   Wt Readings from Last 5 Encounters:  02/26/18 153 lb 12.8 oz (69.8 kg)  02/24/18 154 lb (69.9 kg)  02/20/18 149 lb (67.6 kg)  02/12/18 149 lb (67.6 kg)  02/06/18 158 lb (71.7 kg)     Physical Exam  Constitutional: She is oriented to person, place, and  time and well-developed, well-nourished, and in no distress. No distress.  HENT:  Head: Normocephalic and atraumatic.  Right Ear: Hearing, external ear and ear canal normal.  Left Ear: Hearing, external ear and ear canal normal.  Nose: Nose normal. Right sinus exhibits no maxillary sinus tenderness and no frontal sinus tenderness. Left sinus exhibits no maxillary sinus tenderness and no frontal sinus tenderness.  Mouth/Throat: Uvula is midline and oropharynx is clear and moist. No oropharyngeal exudate.  +pnd, +tms with effusions bilaterally without infection  Eyes: Pupils are equal, round, and reactive to light.  Neck: Normal range of motion. Neck supple. No JVD present.  Cardiovascular: Normal rate, regular rhythm and normal heart sounds.  Pulmonary/Chest: Effort normal and breath sounds normal. No accessory muscle usage. No respiratory distress. She has no decreased breath sounds. She has no wheezes. She has no rhonchi. She has no rales.  Abdominal: Soft. Bowel sounds are normal. There is no tenderness.  Musculoskeletal: Normal range of motion. She exhibits no edema.  Lymphadenopathy:    She has no cervical adenopathy.  Neurological: She is alert and oriented to person, place, and time. Gait normal.  Skin: Skin is warm and dry. She is not diaphoretic. No erythema.  Psychiatric: Mood, memory, affect and judgment normal.  Nursing note and vitals reviewed.      Lab Results:  CBC    Component Value Date/Time   WBC 7.3 02/06/2018 1055   WBC 14.2 (H) 11/28/2017 0614   RBC 4.55 02/06/2018 1055   RBC 4.12 11/28/2017 0614   HGB 13.5 02/06/2018 1055   HCT 41.7 02/06/2018 1055   PLT 285 02/06/2018 1055   MCV 92 02/06/2018 1055   MCH 29.7 02/06/2018 1055   MCH 30.3 11/28/2017 0614   MCHC 32.4 02/06/2018 1055   MCHC 32.9 11/28/2017 0614   RDW 13.7 02/06/2018 1055   LYMPHSABS 2.2 02/06/2018 1055   MONOABS 0.3 11/28/2017 0614   EOSABS 0.3 02/06/2018 1055   BASOSABS 0.0 02/06/2018 1055    BMET    Component Value Date/Time   NA 145 (H) 02/06/2018 1055   K 4.3 02/06/2018 1055   CL 106 02/06/2018 1055   CO2 24 02/06/2018 1055   GLUCOSE 77 02/06/2018 1055   GLUCOSE 169 (H) 11/27/2017 0525   BUN 22 02/06/2018 1055   CREATININE 1.11 (H) 02/06/2018 1055   CREATININE 0.94 10/27/2012 1028   CALCIUM 9.0 02/06/2018 1055   GFRNONAA 47 (L) 02/06/2018 1055   GFRNONAA 60 10/27/2012 1028   GFRAA 54 (L) 02/06/2018 1055   GFRAA 69 10/27/2012 1028    BNP    Component Value Date/Time   BNP 132.6 (H) 11/26/2017 0342    ProBNP    Component Value Date/Time   PROBNP 1,147 (H) 09/07/2017 1157      Assessment & Plan:   This is a pleasant 83 year old female patient complaining follow-up with our office today.  Patient has been doing well since last office visit.  We will continue patient on same plan of care of Symbicort 160.  I have encouraged the patient to stop smoking.  She is not ready to do so at this time.  Patient reports she will consider setting a quit date.  I informed her if she does decide to stop smoking to contact either our office or primary care for further support.  Provided smoking cessation resources today.  Patient with occasional heartburn in spite of her being adherent to her Nexium.  Provided GERD literature today on foods to  avoid.  Patient to follow-up with primary care or our office if heartburn worsens or continues to  not be controlled.  GOLD COPD 0 with Asthmatic Component  Continue Symbicort 160 >>> 2 puffs in the morning right when you wake up, rinse out your mouth after use, 12 hours later 2 puffs, rinse after use >>> Take this daily, no matter what >>> This is not a rescue inhaler  >>> Use spacer   Continue to take Nexium daily >>>Take 30 minutes to an hour before your first meal before medications in the morning >>>If your heartburn or indigestion is worsening please contact primary care our office >>>Review literature below about GERD and acid reflux   We recommend that you stop smoking.  >>>You need to set a quit date >>>If you have friends or family who smoke, let them know you are trying to quit and not to smoke around you or in your living environment  Smoking Cessation Resources:  1 800 QUIT NOW  >>> Patient to call this resource and utilize it to help support her quit smoking >>> Keep up your hard work with stopping smoking  You can also contact the [facility] >>>For smoking cessation classes call [phone]  We do not recommend using e-cigarettes as a form of stopping smoking  You can sign up for smoking cessation support texts and information:  >>>https://smokefree.gov/smokefreetxt  Follow up in 2-3 months with Dr. Isaiah Serge   Tobacco use  We recommend that you stop smoking.  >>>You need to set a quit date >>>If you have friends or family who smoke, let them know you are trying to quit and not to smoke around you or in your living environment  Smoking Cessation Resources:  1 800 QUIT NOW  >>> Patient to call this resource and utilize it to help support her quit smoking >>> Keep up your hard work with stopping smoking  You can also contact the [facility] >>>For smoking cessation classes call [phone]  We do not recommend using e-cigarettes as a form of stopping smoking  You can sign up for smoking cessation support texts and  information:  >>>https://smokefree.gov/smokefreetxt   Follow up in 2-3 months with Dr. Isaiah Serge   GERD (gastroesophageal reflux disease)  Continue to take Nexium daily >>>Take 30 minutes to an hour before your first meal before medications in the morning >>>If your heartburn or indigestion is worsening please contact primary care or our office >>>Review literature below about GERD and acid reflux  Follow up in 2-3 months with Dr. Magdalene Molly, NP 02/26/2018

## 2018-02-26 ENCOUNTER — Ambulatory Visit (INDEPENDENT_AMBULATORY_CARE_PROVIDER_SITE_OTHER): Payer: Medicare Other | Admitting: Pulmonary Disease

## 2018-02-26 ENCOUNTER — Encounter: Payer: Self-pay | Admitting: Pulmonary Disease

## 2018-02-26 DIAGNOSIS — F17210 Nicotine dependence, cigarettes, uncomplicated: Secondary | ICD-10-CM

## 2018-02-26 DIAGNOSIS — Z720 Tobacco use: Secondary | ICD-10-CM

## 2018-02-26 DIAGNOSIS — K219 Gastro-esophageal reflux disease without esophagitis: Secondary | ICD-10-CM

## 2018-02-26 DIAGNOSIS — J449 Chronic obstructive pulmonary disease, unspecified: Secondary | ICD-10-CM

## 2018-02-26 IMAGING — DX DG CERVICAL SPINE COMPLETE 4+V
5 series · 5 of 5 positions shown · non-contrast
Comparison: 04/19/2015 .

CLINICAL DATA: Fall.  Initial evaluation.

EXAM:
CERVICAL SPINE - COMPLETE 4+ VIEW

[c-spine lat]
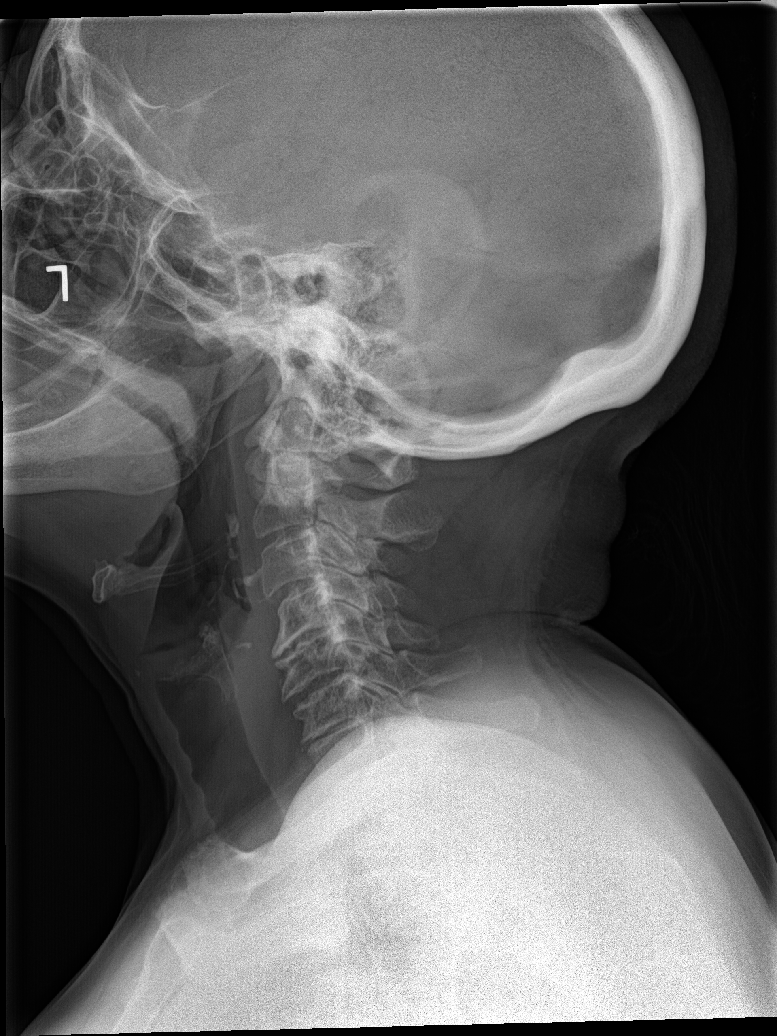

[c-spine obl (1 of 2)]
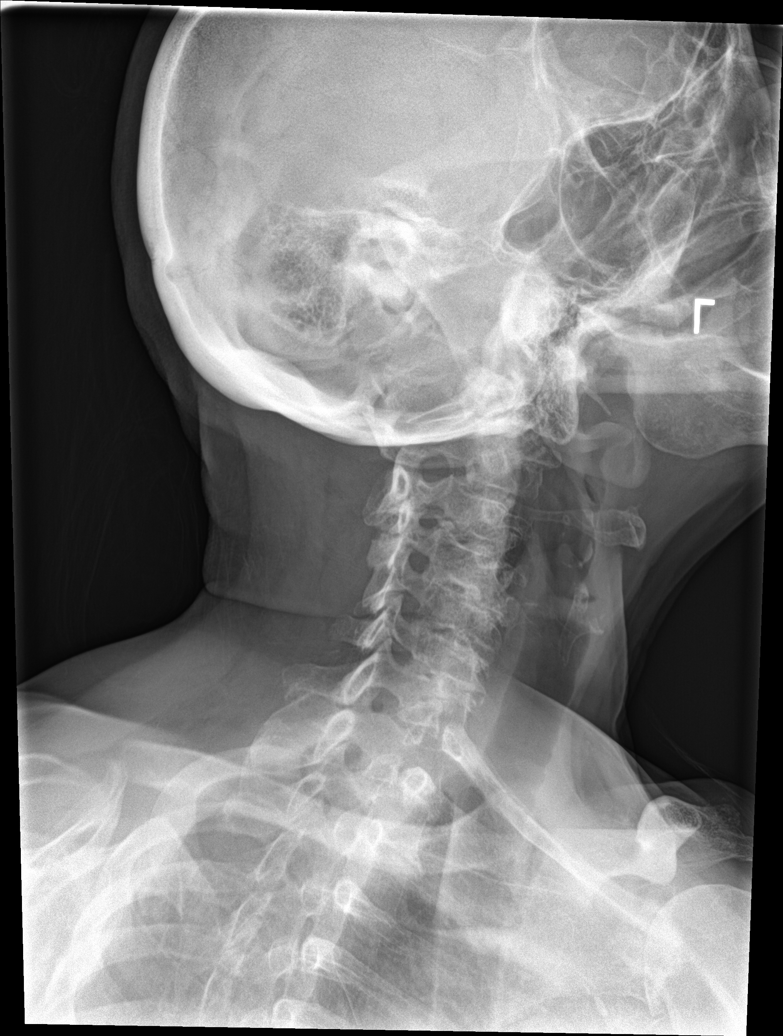

[c-spine obl (2 of 2)]
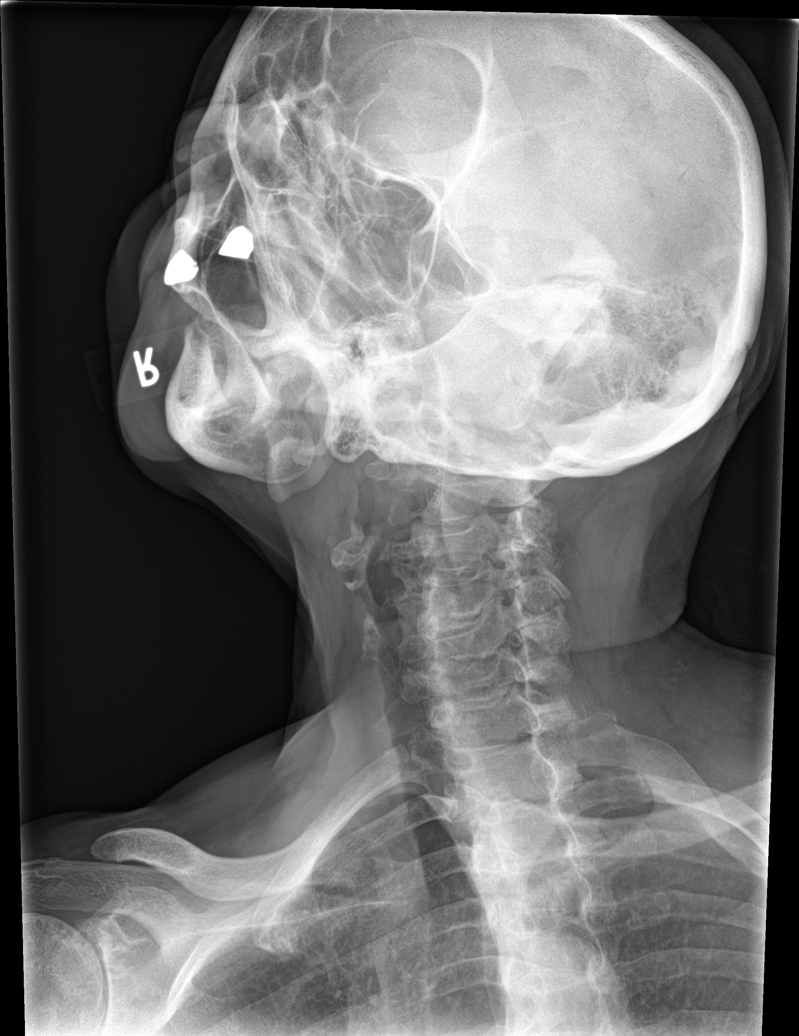

[c-spine ap]
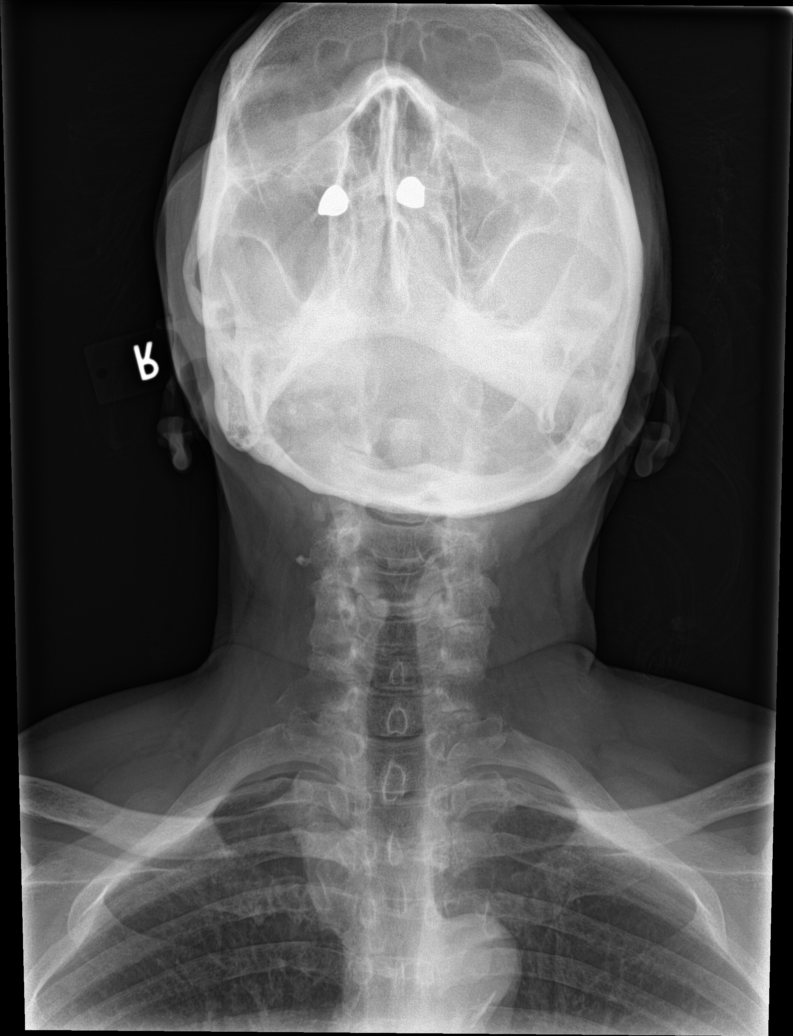

[c-spine open mouth]
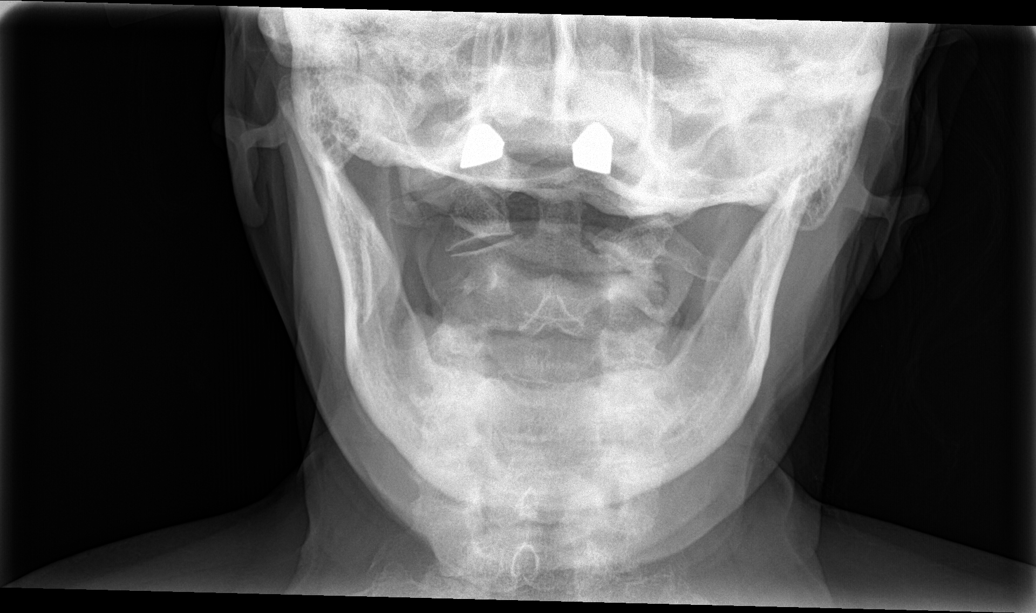

[5 of 5 positions shown; findings below may reference images not displayed]

FINDINGS: Diffuse degenerative change cervical spine. No acute bony
abnormality identified. Normal alignment. Pulmonary apices are
clear.
IMPRESSION: Diffuse multilevel degenerative change.  No acute abnormality.

## 2018-02-26 IMAGING — DX DG THORACIC SPINE 2V
2 series · 2 of 2 positions shown · non-contrast
Comparison: CT chest 02/21/2015.

CLINICAL DATA: Status post fall.  Thoracic spine pain.

EXAM:
THORACIC SPINE 2 VIEWS

[t-spine ap]
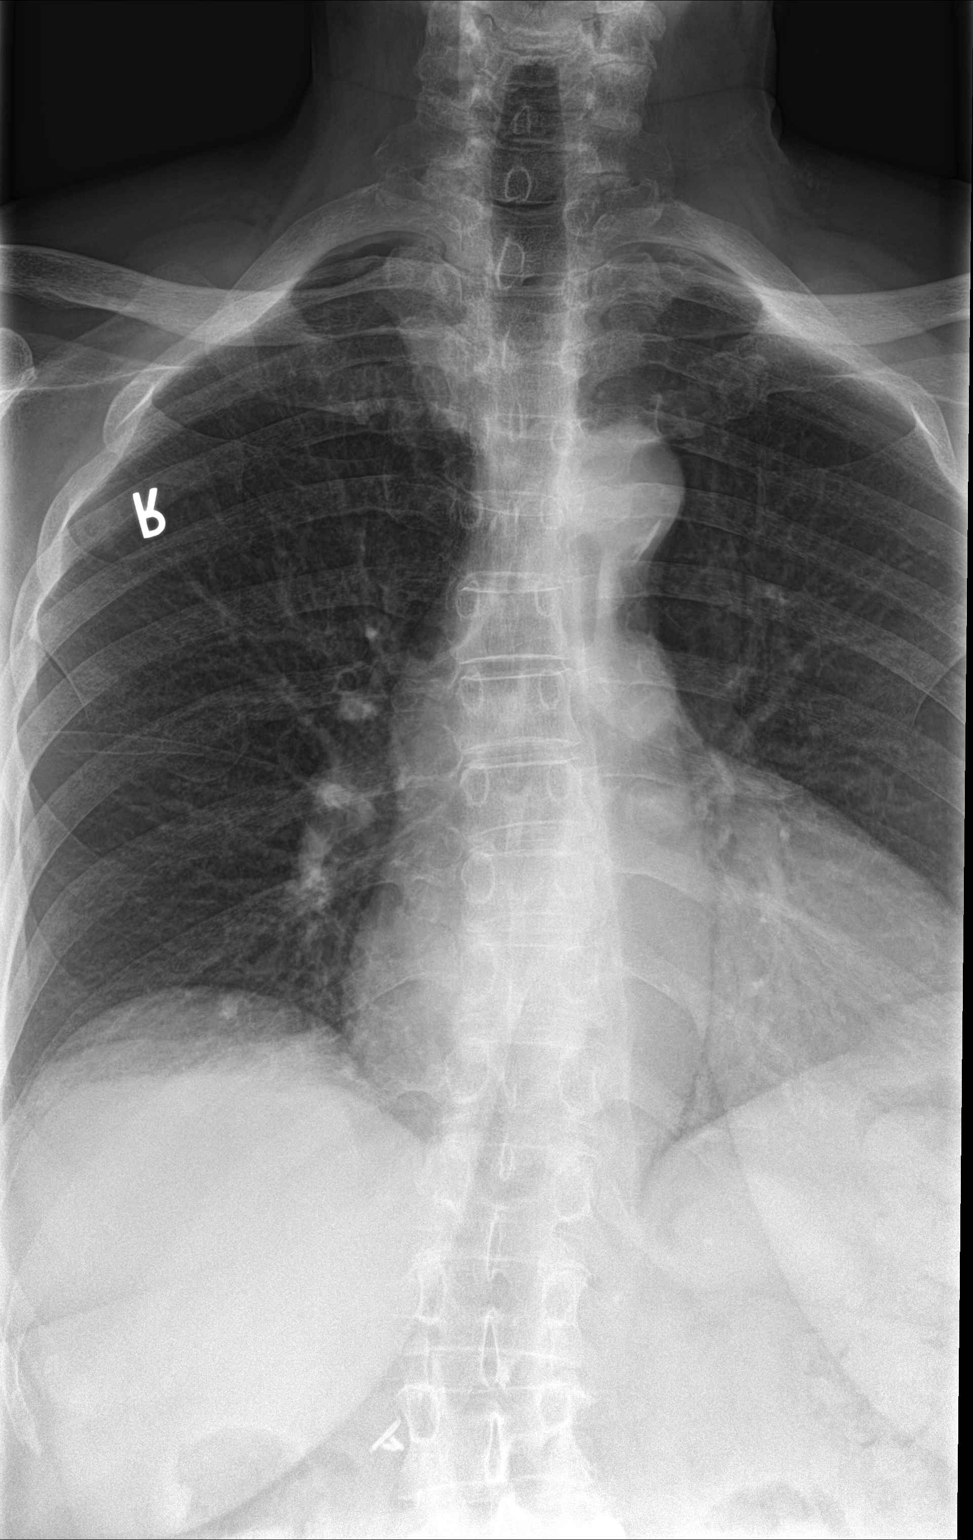

[t-spine lat]
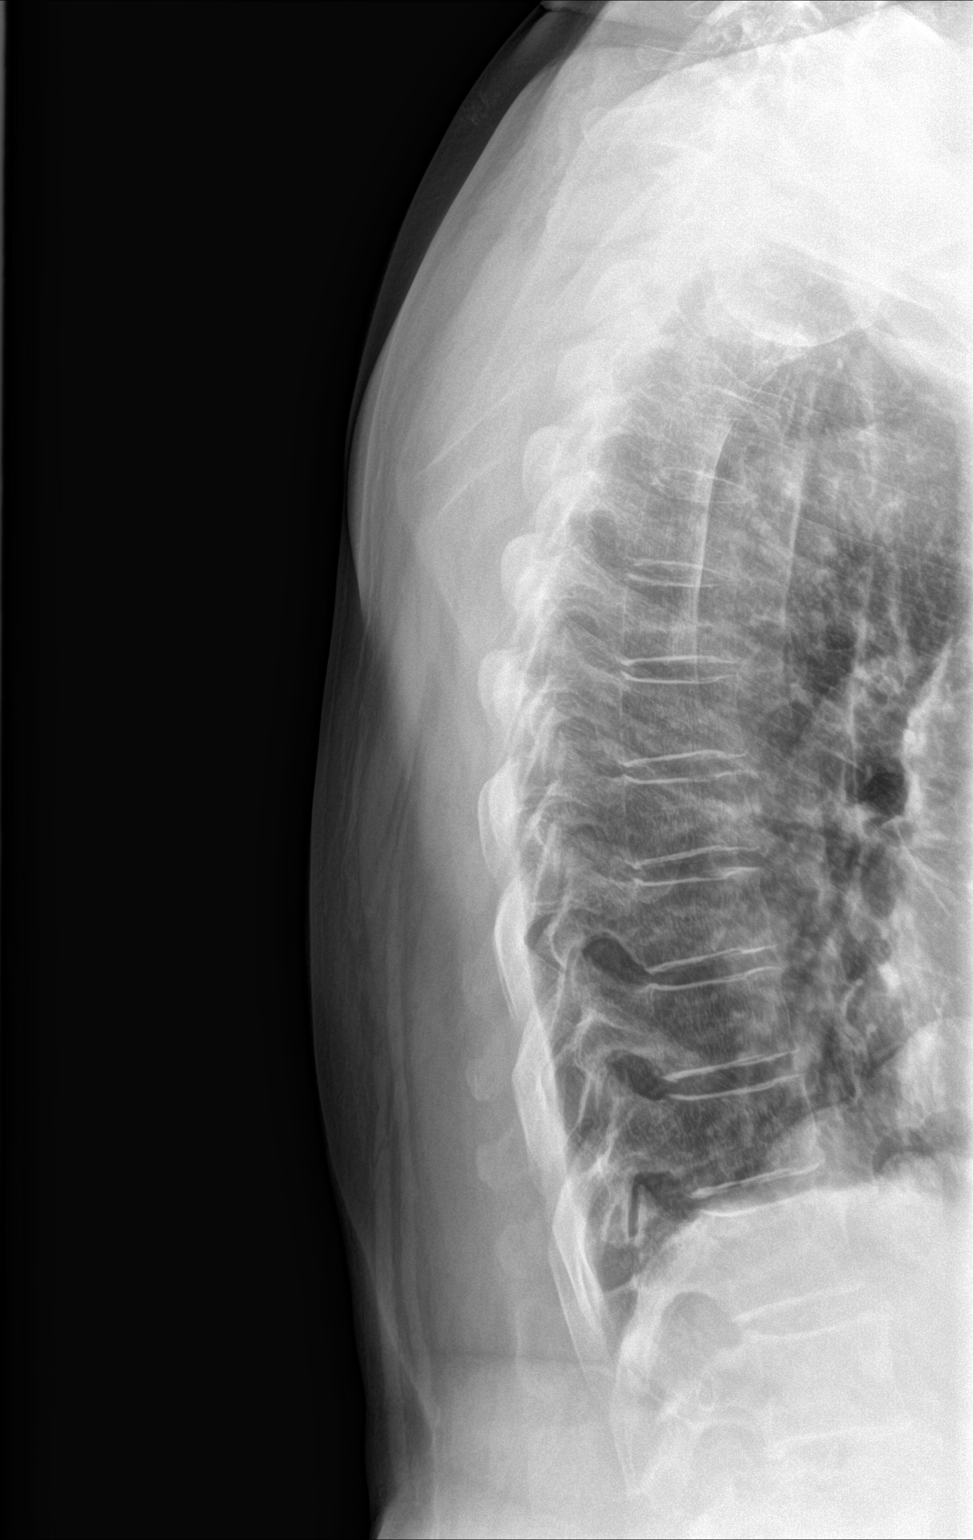

[2 of 2 positions shown; findings below may reference images not displayed]

FINDINGS: No fracture or malalignment is identified. Mild S-shaped
thoracolumbar scoliosis is noted. Paraspinous structures are
unremarkable.
IMPRESSION: No acute finding.

## 2018-02-26 MED ORDER — BUDESONIDE-FORMOTEROL FUMARATE 160-4.5 MCG/ACT IN AERO: 2.0000 | INHALATION_SPRAY | Freq: Two times a day (BID) | RESPIRATORY_TRACT | 0 refills | Status: DC

## 2018-02-26 NOTE — Patient Instructions (Addendum)
Continue Symbicort 160 >>> 2 puffs in the morning right when you wake up, rinse out your mouth after use, 12 hours later 2 puffs, rinse after use >>> Take this daily, no matter what >>> This is not a rescue inhaler  >>> Use spacer   Continue to take Nexium daily >>>Take 30 minutes to an hour before your first meal before medications in the morning >>>If your heartburn or indigestion is worsening please contact primary care our office >>>Review literature below about GERD and acid reflux   We recommend that you stop smoking.  >>>You need to set a quit date >>>If you have friends or family who smoke, let them know you are trying to quit and not to smoke around you or in your living environment  Smoking Cessation Resources:  1 800 QUIT NOW  >>> Patient to call this resource and utilize it to help support her quit smoking >>> Keep up your hard work with stopping smoking  You can also contact the [facility] >>>For smoking cessation classes call [phone]  We do not recommend using e-cigarettes as a form of stopping smoking  You can sign up for smoking cessation support texts and information:  >>>https://smokefree.gov/smokefreetxt    Follow up in 2-3 months with Dr. Isaiah Serge     November/2019 we will be moving! We will no longer be at our [place] location.  Be on the look out for a post card/mailer to let you know we have officially moved.  Our new address and phone number will be:  [address] [place], [place] [address] Telephone number: [phone]  It is flu season:   >>>Remember to be washing your hands regularly, using hand sanitizer, be careful to use around herself with has contact with people who are sick will increase her chances of getting sick yourself. >>> Best ways to protect herself from the flu: Receive the yearly flu vaccine, practice good hand hygiene washing with soap and also using hand sanitizer when available, eat a nutritious  meals, get adequate rest, hydrate appropriately   Please contact the office if your symptoms worsen or you have concerns that you are not improving.   Thank you for choosing [facility] for your healthcare, and for allowing [place] to partner with you on your healthcare journey. I am thankful to be able to provide care to you today.   Elisha Headland FNP-C      Food Choices for Gastroesophageal Reflux Disease, Adult When you have gastroesophageal reflux disease (GERD), the foods you eat and your eating habits are very important. Choosing the right foods can help ease your discomfort. What guidelines do I need to follow?  Choose fruits, vegetables, whole grains, and low-fat dairy products.  Choose low-fat meat, fish, and poultry.  Limit fats such as oils, salad dressings, butter, nuts, and avocado.  Keep a food diary. This helps you identify foods that cause symptoms.  Avoid foods that cause symptoms. These may be different for everyone.  Eat small meals often instead of 3 large meals a day.  Eat your meals slowly, in a place where you are relaxed.  Limit fried foods.  Cook foods using methods other than frying.  Avoid drinking alcohol.  Avoid drinking large amounts of liquids with your meals.  Avoid bending over or lying down until 2-3 hours after eating. What foods are not recommended? These are some foods and drinks that may make your symptoms worse: Vegetables Tomatoes. Tomato juice. Tomato and  spaghetti sauce. Chili peppers. Onion and garlic. Horseradish. Fruits Oranges, grapefruit, and lemon (fruit and juice). Meats High-fat meats, fish, and poultry. This includes hot dogs, ribs, ham, sausage, salami, and bacon. Dairy Whole milk and chocolate milk. Sour cream. Cream. Butter. Ice cream. Cream cheese. Drinks Coffee and tea. Bubbly (carbonated) drinks or energy drinks. Condiments Hot sauce. Barbecue sauce. Sweets/Desserts Chocolate and cocoa. Donuts.  Peppermint and spearmint. Fats and Oils High-fat foods. This includes [place] fries and potato chips. Other Vinegar. Strong spices. This includes black pepper, white pepper, red pepper, cayenne, curry powder, cloves, ginger, and chili powder. The items listed above may not be a complete list of foods and drinks to avoid. Contact your dietitian for more information. This information is not intended to replace advice given to you by your health care provider. Make sure you discuss any questions you have with your health care provider. Document Released: 10/16/2011 Document Revised: 09/22/2015 Document Reviewed: 02/18/2013 Elsevier Interactive Patient Education  2017 Elsevier Inc.  Gastroesophageal Reflux Disease, Adult Normally, food travels down the esophagus and stays in the stomach to be digested. If a person has gastroesophageal reflux disease (GERD), food and stomach acid move back up into the esophagus. When this happens, the esophagus becomes sore and swollen (inflamed). Over time, GERD can make small holes (ulcers) in the lining of the esophagus. Follow these instructions at home: Diet  Follow a diet as told by your doctor. You may need to avoid foods and drinks such as: ? Coffee and tea (with or without caffeine). ? Drinks that contain alcohol. ? Energy drinks and sports drinks. ? Carbonated drinks or sodas. ? Chocolate and cocoa. ? Peppermint and mint flavorings. ? Garlic and onions. ? Horseradish. ? Spicy and acidic foods, such as peppers, chili powder, curry powder, vinegar, hot sauces, and BBQ sauce. ? Citrus fruit juices and citrus fruits, such as oranges, lemons, and limes. ? Tomato-based foods, such as red sauce, chili, salsa, and pizza with red sauce. ? Fried and fatty foods, such as donuts, french fries, potato chips, and high-fat dressings. ? High-fat meats, such as hot dogs, rib eye steak, sausage, ham, and bacon. ? High-fat dairy items, such as whole milk, butter, and  cream cheese.  Eat small meals often. Avoid eating large meals.  Avoid drinking large amounts of liquid with your meals.  Avoid eating meals during the 2-3 hours before bedtime.  Avoid lying down right after you eat.  Do not exercise right after you eat. General instructions  Pay attention to any changes in your symptoms.  Take over-the-counter and prescription medicines only as told by your doctor. Do not take aspirin, ibuprofen, or other NSAIDs unless your doctor says it is okay.  Do not use any tobacco products, including cigarettes, chewing tobacco, and e-cigarettes. If you need help quitting, ask your doctor.  Wear loose clothes. Do not wear anything tight around your waist.  Raise (elevate) the head of your bed about 6 inches (15 cm).  Try to lower your stress. If you need help doing this, ask your doctor.  If you are overweight, lose an amount of weight that is healthy for you. Ask your doctor about a safe weight loss goal.  Keep all follow-up visits as told by your doctor. This is important. Contact a doctor if:  You have new symptoms.  You lose weight and you do not know why it is happening.  You have trouble swallowing, or it hurts to swallow.  You have wheezing or  a cough that keeps happening.  Your symptoms do not get better with treatment.  You have a hoarse voice. Get help right away if:  You have pain in your arms, neck, jaw, teeth, or back.  You feel sweaty, dizzy, or light-headed.  You have chest pain or shortness of breath.  You throw up (vomit) and your throw up looks like blood or coffee grounds.  You pass out (faint).  Your poop (stool) is bloody or black.  You cannot swallow, drink, or eat. This information is not intended to replace advice given to you by your health care provider. Make sure you discuss any questions you have with your health care provider. Document Released: 10/03/2007 Document Revised: 09/22/2015 Document Reviewed:  08/11/2014 Elsevier Interactive Patient Education  2018 [employer].  Coping with Quitting Smoking Quitting smoking is a physical and mental challenge. You will face cravings, withdrawal symptoms, and temptation. Before quitting, work with your health care provider to make a plan that can help you cope. Preparation can help you quit and keep you from giving in. How can I cope with cravings? Cravings usually last for 5-10 minutes. If you get through it, the craving will pass. Consider taking the following actions to help you cope with cravings:  Keep your mouth busy: ? Chew sugar-free gum. ? Suck on hard candies or a straw. ? Brush your teeth.  Keep your hands and body busy: ? Immediately change to a different activity when you feel a craving. ? Squeeze or play with a ball. ? Do an activity or a hobby, like making bead jewelry, practicing needlepoint, or working with wood. ? Mix up your normal routine. ? Take a short exercise break. Go for a quick walk or run up and down stairs. ? Spend time in public places where smoking is not allowed.  Focus on doing something kind or helpful for someone else.  Call a friend or family member to talk during a craving.  Join a support group.  Call a quit line, such as 1-800-QUIT-NOW.  Talk with your health care provider about medicines that might help you cope with cravings and make quitting easier for you.  How can I deal with withdrawal symptoms? Your body may experience negative effects as it tries to get used to not having nicotine in the system. These effects are called withdrawal symptoms. They may include:  Feeling hungrier than normal.  Trouble concentrating.  Irritability.  Trouble sleeping.  Feeling depressed.  Restlessness and agitation.  Craving a cigarette.  To manage withdrawal symptoms:  Avoid places, people, and activities that trigger your cravings.  Remember why you want to quit.  Get plenty of sleep.  Avoid  coffee and other caffeinated drinks. These may worsen some of your symptoms.  How can I handle social situations? Social situations can be difficult when you are quitting smoking, especially in the first few weeks. To manage this, you can:  Avoid parties, bars, and other social situations where people might be smoking.  Avoid alcohol.  Leave right away if you have the urge to smoke.  Explain to your family and friends that you are quitting smoking. Ask for understanding and support.  Plan activities with friends or family where smoking is not an option.  What are some ways I can cope with stress? Wanting to smoke may cause stress, and stress can make you want to smoke. Find ways to manage your stress. Relaxation techniques can help. For example:  Breathe slowly and deeply,  in through your nose and out through your mouth.  Listen to soothing, relaxing music.  Talk with a family member or friend about your stress.  Light a candle.  Soak in a bath or take a shower.  Think about a peaceful place.  What are some ways I can prevent weight gain? Be aware that many people gain weight after they quit smoking. However, not everyone does. To keep from gaining weight, have a plan in place before you quit and stick to the plan after you quit. Your plan should include:  Having healthy snacks. When you have a craving, it may help to: ? Eat plain popcorn, crunchy carrots, celery, or other cut vegetables. ? Chew sugar-free gum.  Changing how you eat: ? Eat small portion sizes at meals. ? Eat 4-6 small meals throughout the day instead of 1-2 large meals a day. ? Be mindful when you eat. Do not watch television or do other things that might distract you as you eat.  Exercising regularly: ? Make time to exercise each day. If you do not have time for a long workout, do short bouts of exercise for 5-10 minutes several times a day. ? Do some form of strengthening exercise, like weight lifting,  and some form of aerobic exercise, like running or swimming.  Drinking plenty of water or other low-calorie or no-calorie drinks. Drink 6-8 glasses of water daily, or as much as instructed by your health care provider.  Summary  Quitting smoking is a physical and mental challenge. You will face cravings, withdrawal symptoms, and temptation to smoke again. Preparation can help you as you go through these challenges.  You can cope with cravings by keeping your mouth busy (such as by chewing gum), keeping your body and hands busy, and making calls to family, friends, or a helpline for people who want to quit smoking.  You can cope with withdrawal symptoms by avoiding places where people smoke, avoiding drinks with caffeine, and getting plenty of rest.  Ask your health care provider about the different ways to prevent weight gain, avoid stress, and handle social situations. This information is not intended to replace advice given to you by your health care provider. Make sure you discuss any questions you have with your health care provider. Document Released: 04/13/2016 Document Revised: 04/13/2016 Document Reviewed: 04/13/2016 Elsevier Interactive Patient Education  [employer].

## 2018-02-26 NOTE — Assessment & Plan Note (Signed)
  We recommend that you stop smoking.  >>>You need to set a quit date >>>If you have friends or family who smoke, let them know you are trying to quit and not to smoke around you or in your living environment  Smoking Cessation Resources:  1 800 QUIT NOW  >>> Patient to call this resource and utilize it to help support her quit smoking >>> Keep up your hard work with stopping smoking  You can also contact the [facility] >>>For smoking cessation classes call [phone]  We do not recommend using e-cigarettes as a form of stopping smoking  You can sign up for smoking cessation support texts and information:  >>>https://smokefree.gov/smokefreetxt   Follow up in 2-3 months with Dr. Isaiah Serge

## 2018-02-26 NOTE — Assessment & Plan Note (Signed)
Continue Symbicort 160 >>> 2 puffs in the morning right when you wake up, rinse out your mouth after use, 12 hours later 2 puffs, rinse after use >>> Take this daily, no matter what >>> This is not a rescue inhaler  >>> Use spacer   Continue to take Nexium daily >>>Take 30 minutes to an hour before your first meal before medications in the morning >>>If your heartburn or indigestion is worsening please contact primary care our office >>>Review literature below about GERD and acid reflux   We recommend that you stop smoking.  >>>You need to set a quit date >>>If you have friends or family who smoke, let them know you are trying to quit and not to smoke around you or in your living environment  Smoking Cessation Resources:  1 800 QUIT NOW  >>> Patient to call this resource and utilize it to help support her quit smoking >>> Keep up your hard work with stopping smoking  You can also contact the [facility] >>>For smoking cessation classes call [phone]  We do not recommend using e-cigarettes as a form of stopping smoking  You can sign up for smoking cessation support texts and information:  >>>https://smokefree.gov/smokefreetxt  Follow up in 2-3 months with Dr. Isaiah Serge

## 2018-02-26 NOTE — Assessment & Plan Note (Signed)
  Continue to take Nexium daily >>>Take 30 minutes to an hour before your first meal before medications in the morning >>>If your heartburn or indigestion is worsening please contact primary care or our office >>>Review literature below about GERD and acid reflux  Follow up in 2-3 months with Dr. Isaiah [name]

## 2018-02-26 NOTE — Addendum Note (Signed)
Addended by: Pilar Grammes on: 02/26/2018 10:06 AM   Modules accepted: Orders

## 2018-02-28 ENCOUNTER — Encounter: Payer: Self-pay | Admitting: *Deleted

## 2018-03-05 ENCOUNTER — Telehealth (INDEPENDENT_AMBULATORY_CARE_PROVIDER_SITE_OTHER): Payer: Self-pay | Admitting: Orthopaedic Surgery

## 2018-03-05 NOTE — Telephone Encounter (Signed)
Please advise on message below concerning PT for right arm.  Thank You

## 2018-03-05 NOTE — Telephone Encounter (Signed)
I called discussed. 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

## 2018-03-05 NOTE — Telephone Encounter (Signed)
Patient called advised she can hardly raise her  Right arm up. Patient asked if she need (PT) for her arm?  The number to contact patient is [phone]

## 2018-03-06 ENCOUNTER — Encounter

## 2018-03-06 ENCOUNTER — Institutional Professional Consult (permissible substitution): Payer: Medicare Other | Admitting: Neurology

## 2018-03-07 ENCOUNTER — Other Ambulatory Visit: Payer: Self-pay | Admitting: Pediatrics

## 2018-03-07 ENCOUNTER — Telehealth (INDEPENDENT_AMBULATORY_CARE_PROVIDER_SITE_OTHER): Payer: Self-pay | Admitting: Orthopaedic Surgery

## 2018-03-07 DIAGNOSIS — J45909 Unspecified asthma, uncomplicated: Secondary | ICD-10-CM

## 2018-03-07 NOTE — Telephone Encounter (Signed)
Please see below and advise.

## 2018-03-07 NOTE — Telephone Encounter (Signed)
I called daughter and discussed.  Discussed with the daughter what I talked to her mother about.  We discussed laying down using a cane into hands for active assistive range of motion.  Putting a nail on top of bedroom door using a rope for active assistive range of motion.  Daughter will talk her mother through this.  Patient is having memory problems.  This not effective will proceed with formal therapy.FYI

## 2018-03-07 NOTE — Telephone Encounter (Signed)
noted 

## 2018-03-07 NOTE — Telephone Encounter (Signed)
Patient's daughter [name]) called stating Dr [name] called her mother yesterday and talked to her about her arm. [name] said her mother do not remember what Dr [name] said. Tonya asked if she can get a call concerning the conversation Dr. [name] had so she can help her mother. [name] also asked if her mother can be set up for (PT) The number to contact [name] is [phone]

## 2018-03-13 ENCOUNTER — Other Ambulatory Visit: Payer: Self-pay | Admitting: Pediatrics

## 2018-03-20 ENCOUNTER — Ambulatory Visit (INDEPENDENT_AMBULATORY_CARE_PROVIDER_SITE_OTHER): Payer: Medicare Other | Admitting: Pediatrics

## 2018-03-20 ENCOUNTER — Encounter: Payer: Self-pay | Admitting: Pediatrics

## 2018-03-20 VITALS — BP 135/86 | HR 76 | Temp 98.4°F | Ht 62.0 in | Wt 155.8 lb

## 2018-03-20 DIAGNOSIS — F339 Major depressive disorder, recurrent, unspecified: Secondary | ICD-10-CM | POA: Diagnosis not present

## 2018-03-20 DIAGNOSIS — K219 Gastro-esophageal reflux disease without esophagitis: Secondary | ICD-10-CM | POA: Diagnosis not present

## 2018-03-20 DIAGNOSIS — J449 Chronic obstructive pulmonary disease, unspecified: Secondary | ICD-10-CM

## 2018-03-20 DIAGNOSIS — F0390 Unspecified dementia without behavioral disturbance: Secondary | ICD-10-CM | POA: Diagnosis not present

## 2018-03-20 DIAGNOSIS — I5032 Chronic diastolic (congestive) heart failure: Secondary | ICD-10-CM | POA: Diagnosis not present

## 2018-03-20 DIAGNOSIS — I10 Essential (primary) hypertension: Secondary | ICD-10-CM

## 2018-03-20 DIAGNOSIS — M5481 Occipital neuralgia: Secondary | ICD-10-CM

## 2018-03-20 DIAGNOSIS — F03A0 Unspecified dementia, mild, without behavioral disturbance, psychotic disturbance, mood disturbance, and anxiety: Secondary | ICD-10-CM

## 2018-03-20 MED ORDER — BUPROPION HCL ER (SR) 150 MG PO TB12: ORAL_TABLET | ORAL | 1 refills | Status: DC

## 2018-03-20 MED ORDER — CARVEDILOL 6.25 MG PO TABS: ORAL_TABLET | ORAL | 2 refills | Status: DC

## 2018-03-20 MED ORDER — ESOMEPRAZOLE MAGNESIUM 40 MG PO CPDR: 40.0000 mg | DELAYED_RELEASE_CAPSULE | Freq: Every day | ORAL | 1 refills | Status: DC

## 2018-03-20 NOTE — Progress Notes (Signed)
  Subjective:   Patient ID: [name], female    DOB: [birth year], 83 y.o.   MRN: [id number] CC: Medical Management of Chronic Issues  HPI: [name] is a 83 y.o. female  COPD: Feels like her breathing has been fine.  Not regularly needing albuterol.  Trying to cut down on cigarettes.  Occipital neuralgia: Had to cancel appoint with neurology because of lack of transportation.  She says last week she had just 2 episodes.  So for this week she has not had any episodes.  She does think it is getting better.  She is taking gabapentin regularly.  CHF: No swelling in her lower legs.  Taking medicines regularly  She is looking forward to going on a cruise next week for her birthday with her daughter.  She has been packing.  She has noticed that her memory is not as good as what it usually is.  She has not tolerated medications for memory in the past.  Hypertension: Taking medicine regularly.  Depression: Mood has been doing well.  She feels able to do all that she needs at home.  Her son has a caregiver with him more often, helping with baths.  She has been resting well at night  Relevant past medical, surgical, family and social history reviewed. Allergies and medications reviewed and updated. Social History   Tobacco Use  Smoking Status Current Some Day Smoker  . Years: 60.00  . Types: Cigarettes  Smokeless Tobacco Never Used  Tobacco Comment   1- 2 cigs a day-  1 pkg will  last 2 weeks   ROS: Per HPI   Objective:    BP 135/86   Pulse 76   Temp 98.4 F (36.9 C) (Oral)   Ht 5\' 2"  (1.575 m)   Wt 155 lb 12.8 oz (70.7 kg)   BMI 28.50 kg/m   Wt Readings from Last 3 Encounters:  03/20/18 155 lb 12.8 oz (70.7 kg)  02/26/18 153 lb 12.8 oz (69.8 kg)  02/24/18 154 lb (69.9 kg)    Gen: NAD, alert, cooperative with exam, NCAT EYES: EOMI, no conjunctival injection, or no icterus ENT:  OP without erythema LYMPH: no cervical LAD CV: NRRR, normal S1/S2, no murmur, distal  pulses 2+ b/l Resp: CTABL, no wheezes, normal WOB Abd: +BS, soft, NTND.  Ext: No edema, warm Neuro: Alert and oriented MSK: normal muscle bulk  Assessment & Plan:  Nyelle was seen today for medical management of chronic issues.  Diagnoses and all orders for this visit:  Depression, recurrent (HCC) Stable, continue below -     buPROPion (WELLBUTRIN SR) 150 MG 12 hr tablet; TAKE (1) TABLET TWICE A DAY.  Gastroesophageal reflux disease, esophagitis presence not specified Stable, continue below -     esomeprazole (NEXIUM) 40 MG capsule; Take 1 capsule (40 mg total) by mouth daily before breakfast.  Chronic diastolic heart failure (HCC) Euvolemic, continue below -     carvedilol (COREG) 6.25 MG tablet; TAKE  (1)  TABLET TWICE A DAY WITH MEALS (BREAKFAST AND SUPPER)  Mild dementia (HCC) Continue engagement, staying active  Essential hypertension Stable, continue current meds  Occipital neuralgia of left side Some ongoing symptoms, improved last few days.  Encourage follow-up with neurology if worsening.  Phone number given so she can reschedule appointment  GOLD COPD 0 with Asthmatic Component  Stable.  Following with pulmonology  Follow up plan: Return in about 3 months (around 06/20/2018). 06/22/2018, MD Rex Kras [facility]

## 2018-03-20 NOTE — Patient Instructions (Signed)
[facility]- Dr. Terrace Arabia [address], [place], [place] [address] [phone]

## 2018-04-14 ENCOUNTER — Encounter: Payer: Self-pay | Admitting: Nurse Practitioner

## 2018-04-14 ENCOUNTER — Ambulatory Visit (INDEPENDENT_AMBULATORY_CARE_PROVIDER_SITE_OTHER): Payer: Medicare Other | Admitting: Nurse Practitioner

## 2018-04-14 VITALS — BP 138/77 | HR 75 | Temp 97.5°F | Ht 62.0 in | Wt 161.0 lb

## 2018-04-14 DIAGNOSIS — R059 Cough, unspecified: Secondary | ICD-10-CM

## 2018-04-14 DIAGNOSIS — M25471 Effusion, right ankle: Secondary | ICD-10-CM

## 2018-04-14 DIAGNOSIS — M25472 Effusion, left ankle: Secondary | ICD-10-CM | POA: Diagnosis not present

## 2018-04-14 DIAGNOSIS — R05 Cough: Secondary | ICD-10-CM

## 2018-04-14 MED ORDER — BENZONATATE 100 MG PO CAPS: 100.0000 mg | ORAL_CAPSULE | Freq: Three times a day (TID) | ORAL | 0 refills | Status: DC | PRN

## 2018-04-14 NOTE — Patient Instructions (Signed)
Edema Edema is when you have too much fluid in your body or under your skin. Edema may make your legs, feet, and ankles swell up. Swelling is also common in looser tissues, like around your eyes. This is a common condition. It gets more common as you get older. There are many possible causes of edema. Eating too much salt (sodium) and being on your feet or sitting for a long time can cause edema in your legs, feet, and ankles. Hot weather may make edema worse. Edema is usually painless. Your skin may look swollen or shiny. Follow these instructions at home:  Keep the swollen body part raised (elevated) above the level of your heart when you are sitting or lying down.  Do not sit still or stand for a long time.  Do not wear tight clothes. Do not wear garters on your upper legs.  Exercise your legs. This can help the swelling go down.  Wear elastic bandages or support stockings as told by your doctor.  Eat a low-salt (low-sodium) diet to reduce fluid as told by your doctor.  Depending on the cause of your swelling, you may need to limit how much fluid you drink (fluid restriction).  Take over-the-counter and prescription medicines only as told by your doctor. Contact a doctor if:  Treatment is not working.  You have heart, liver, or kidney disease and have symptoms of edema.  You have sudden and unexplained weight gain. Get help right away if:  You have shortness of breath or chest pain.  You cannot breathe when you lie down.  You have pain, redness, or warmth in the swollen areas.  You have heart, liver, or kidney disease and get edema all of a sudden.  You have a fever and your symptoms get worse all of a sudden. Summary  Edema is when you have too much fluid in your body or under your skin.  Edema may make your legs, feet, and ankles swell up. Swelling is also common in looser tissues, like around your eyes.  Raise (elevate) the swollen body part above the level of your  heart when you are sitting or lying down.  Follow your doctor's instructions about diet and how much fluid you can drink (fluid restriction). This information is not intended to replace advice given to you by your health care provider. Make sure you discuss any questions you have with your health care provider. Document Released: 10/03/2007 Document Revised: 05/04/2016 Document Reviewed: 05/04/2016 Elsevier Interactive Patient Education  2017 Elsevier Inc.  

## 2018-04-14 NOTE — Progress Notes (Signed)
Subjective:    Patient ID: [name], female    DOB: 1934/10/10, 83 y.o.   MRN: [id number]   Chief Complaint: Edema (ankles); URI (started over weekend, Thursday night, no known fever, not taking allergy med); Cough (some greenish phlegm); and Shortness of Breath (using inhalers, not seeming to help alot)   HPI Patient comes in c/o sob and cough. Started over the weekend. Has sob with doing anything. Has noticed some edema in bil ankles which is unusual for her.    Review of Systems  Constitutional: Negative for chills and fever.  HENT: Positive for congestion and rhinorrhea. Negative for sore throat and trouble swallowing.   Respiratory: Positive for cough (non productive) and shortness of breath.   Cardiovascular: Positive for leg swelling (ankle swelling).  Gastrointestinal: Negative.   Neurological: Negative for headaches.  Psychiatric/Behavioral: Negative.   All other systems reviewed and are negative.      Objective:   Physical Exam Vitals signs and nursing note reviewed.  Constitutional:      General: She is not in acute distress.    Appearance: She is well-developed and normal weight. She is not ill-appearing.  HENT:     Right Ear: Hearing, tympanic membrane, ear canal and external ear normal.     Left Ear: Hearing, tympanic membrane, ear canal and external ear normal.     Nose: Mucosal edema and rhinorrhea present.     Right Sinus: No maxillary sinus tenderness or frontal sinus tenderness.     Left Sinus: No maxillary sinus tenderness or frontal sinus tenderness.     Mouth/Throat:     Mouth: Mucous membranes are moist.     Pharynx: Oropharynx is clear.  Eyes:     Extraocular Movements: Extraocular movements intact.     Pupils: Pupils are equal, round, and reactive to light.  Neck:     Musculoskeletal: Normal range of motion and neck supple.  Cardiovascular:     Rate and Rhythm: Normal rate.     Pulses: Normal pulses.     Heart sounds: Normal heart  sounds.     Comments: Regular irregularity Pulmonary:     Effort: Pulmonary effort is normal.     Breath sounds: Normal breath sounds.  Musculoskeletal:     Right lower leg: Edema (ankles only) present.     Left lower leg: Edema present.  Skin:    General: Skin is warm and dry.  Neurological:     General: No focal deficit present.     Mental Status: She is alert and oriented to person, place, and time.  Psychiatric:        Mood and Affect: Mood normal.        Behavior: Behavior normal.    BP 138/77   Pulse 75   Temp (!) 97.5 F (36.4 C) (Oral)   Ht 5\' 2"  (1.575 m)   Wt 161 lb (73 kg)   SpO2 97%   BMI 29.45 kg/m         Assessment & Plan:  in today with chief complaint of Edema (ankles); URI (started over weekend, Thursday night, no known fever, not taking allergy med); Cough (some greenish phlegm); and Shortness of Breath (using inhalers, not seeming to help alot)   1. Edema of both ankles Elevate legs when sittig Wear compression socks Take an extra lasix today  2. Cough 1. Take meds as prescribed 2. Use a cool mist humidifier especially during the winter months and when  heat has been humid. 3. Use saline nose sprays frequently 4. Saline irrigations of the nose can be very helpful if done frequently.  * 4X daily for 1 week*  * Use of a nettie pot can be helpful with this. Follow directions with this* 5. Drink plenty of fluids 6. Keep thermostat turn down low 7.For any cough or congestion  Use plain Mucinex- regular strength or max strength is fine   * Children- consult with Pharmacist for dosing 8. For fever or aces or pains- take tylenol or ibuprofen appropriate for age and weight.  * for fevers greater than 101 orally you may alternate ibuprofen and tylenol every  3 hours.    - benzonatate (TESSALON PERLES) 100 MG capsule; Take 1 capsule (100 mg total) by mouth 3 (three) times daily as needed for cough.  Dispense: 20 capsule; Refill:  0  Mary-Margaret Daphine Deutscher, FNP

## 2018-04-17 ENCOUNTER — Other Ambulatory Visit: Payer: Self-pay | Admitting: Pediatrics

## 2018-04-17 DIAGNOSIS — M5481 Occipital neuralgia: Secondary | ICD-10-CM

## 2018-04-21 ENCOUNTER — Telehealth: Payer: Self-pay

## 2018-04-21 ENCOUNTER — Encounter (HOSPITAL_COMMUNITY): Payer: Self-pay | Admitting: Emergency Medicine

## 2018-04-21 ENCOUNTER — Emergency Department (HOSPITAL_COMMUNITY): Payer: Medicare Other

## 2018-04-21 ENCOUNTER — Other Ambulatory Visit: Payer: Self-pay

## 2018-04-21 ENCOUNTER — Emergency Department (HOSPITAL_COMMUNITY)
Admission: EM | Admit: 2018-04-21 | Discharge: 2018-04-21 | Disposition: A | Discharge status: Home / Self Care | Payer: Medicare Other | Source: Home / Self Care | Attending: Emergency Medicine | Admitting: Emergency Medicine

## 2018-04-21 ENCOUNTER — Inpatient Hospital Stay (HOSPITAL_COMMUNITY)
Admission: EM | Admit: 2018-04-21 | Discharge: 2018-04-23 | DRG: 191 | Disposition: A | Discharge status: Home Health | Payer: Medicare Other | Attending: Internal Medicine | Admitting: Internal Medicine

## 2018-04-21 DIAGNOSIS — Z96641 Presence of right artificial hip joint: Secondary | ICD-10-CM

## 2018-04-21 DIAGNOSIS — Z885 Allergy status to narcotic agent status: Secondary | ICD-10-CM

## 2018-04-21 DIAGNOSIS — F419 Anxiety disorder, unspecified: Secondary | ICD-10-CM | POA: Diagnosis present

## 2018-04-21 DIAGNOSIS — Z7951 Long term (current) use of inhaled steroids: Secondary | ICD-10-CM

## 2018-04-21 DIAGNOSIS — I110 Hypertensive heart disease with heart failure: Secondary | ICD-10-CM | POA: Diagnosis present

## 2018-04-21 DIAGNOSIS — Z90710 Acquired absence of both cervix and uterus: Secondary | ICD-10-CM

## 2018-04-21 DIAGNOSIS — F0390 Unspecified dementia without behavioral disturbance: Secondary | ICD-10-CM | POA: Diagnosis present

## 2018-04-21 DIAGNOSIS — Z888 Allergy status to other drugs, medicaments and biological substances status: Secondary | ICD-10-CM

## 2018-04-21 DIAGNOSIS — M1990 Unspecified osteoarthritis, unspecified site: Secondary | ICD-10-CM | POA: Diagnosis present

## 2018-04-21 DIAGNOSIS — K219 Gastro-esophageal reflux disease without esophagitis: Secondary | ICD-10-CM | POA: Diagnosis present

## 2018-04-21 DIAGNOSIS — R0602 Shortness of breath: Secondary | ICD-10-CM | POA: Diagnosis present

## 2018-04-21 DIAGNOSIS — Z9842 Cataract extraction status, left eye: Secondary | ICD-10-CM

## 2018-04-21 DIAGNOSIS — E559 Vitamin D deficiency, unspecified: Secondary | ICD-10-CM | POA: Diagnosis present

## 2018-04-21 DIAGNOSIS — Z9841 Cataract extraction status, right eye: Secondary | ICD-10-CM | POA: Diagnosis not present

## 2018-04-21 DIAGNOSIS — F411 Generalized anxiety disorder: Secondary | ICD-10-CM | POA: Diagnosis not present

## 2018-04-21 DIAGNOSIS — I5022 Chronic systolic (congestive) heart failure: Secondary | ICD-10-CM | POA: Diagnosis present

## 2018-04-21 DIAGNOSIS — Z79899 Other long term (current) drug therapy: Secondary | ICD-10-CM

## 2018-04-21 DIAGNOSIS — Z791 Long term (current) use of non-steroidal anti-inflammatories (NSAID): Secondary | ICD-10-CM | POA: Diagnosis not present

## 2018-04-21 DIAGNOSIS — R059 Cough, unspecified: Secondary | ICD-10-CM

## 2018-04-21 DIAGNOSIS — I5032 Chronic diastolic (congestive) heart failure: Secondary | ICD-10-CM

## 2018-04-21 DIAGNOSIS — I779 Disorder of arteries and arterioles, unspecified: Secondary | ICD-10-CM | POA: Diagnosis present

## 2018-04-21 DIAGNOSIS — J441 Chronic obstructive pulmonary disease with (acute) exacerbation: Secondary | ICD-10-CM | POA: Diagnosis present

## 2018-04-21 DIAGNOSIS — J4541 Moderate persistent asthma with (acute) exacerbation: Secondary | ICD-10-CM | POA: Insufficient documentation

## 2018-04-21 DIAGNOSIS — Z833 Family history of diabetes mellitus: Secondary | ICD-10-CM

## 2018-04-21 DIAGNOSIS — I429 Cardiomyopathy, unspecified: Secondary | ICD-10-CM | POA: Diagnosis present

## 2018-04-21 DIAGNOSIS — Z8249 Family history of ischemic heart disease and other diseases of the circulatory system: Secondary | ICD-10-CM

## 2018-04-21 DIAGNOSIS — I10 Essential (primary) hypertension: Secondary | ICD-10-CM | POA: Diagnosis present

## 2018-04-21 DIAGNOSIS — F03A0 Unspecified dementia, mild, without behavioral disturbance, psychotic disturbance, mood disturbance, and anxiety: Secondary | ICD-10-CM | POA: Diagnosis present

## 2018-04-21 DIAGNOSIS — M8580 Other specified disorders of bone density and structure, unspecified site: Secondary | ICD-10-CM | POA: Diagnosis present

## 2018-04-21 DIAGNOSIS — F329 Major depressive disorder, single episode, unspecified: Secondary | ICD-10-CM | POA: Diagnosis present

## 2018-04-21 DIAGNOSIS — F17210 Nicotine dependence, cigarettes, uncomplicated: Secondary | ICD-10-CM

## 2018-04-21 DIAGNOSIS — Z7982 Long term (current) use of aspirin: Secondary | ICD-10-CM | POA: Insufficient documentation

## 2018-04-21 DIAGNOSIS — Z961 Presence of intraocular lens: Secondary | ICD-10-CM | POA: Diagnosis present

## 2018-04-21 DIAGNOSIS — Z880 Allergy status to penicillin: Secondary | ICD-10-CM

## 2018-04-21 DIAGNOSIS — E782 Mixed hyperlipidemia: Secondary | ICD-10-CM | POA: Diagnosis present

## 2018-04-21 DIAGNOSIS — I739 Peripheral vascular disease, unspecified: Secondary | ICD-10-CM

## 2018-04-21 DIAGNOSIS — Z87891 Personal history of nicotine dependence: Secondary | ICD-10-CM | POA: Diagnosis not present

## 2018-04-21 DIAGNOSIS — R05 Cough: Secondary | ICD-10-CM

## 2018-04-21 DIAGNOSIS — I447 Left bundle-branch block, unspecified: Secondary | ICD-10-CM | POA: Diagnosis present

## 2018-04-21 DIAGNOSIS — Z79891 Long term (current) use of opiate analgesic: Secondary | ICD-10-CM

## 2018-04-21 DIAGNOSIS — J449 Chronic obstructive pulmonary disease, unspecified: Secondary | ICD-10-CM | POA: Diagnosis present

## 2018-04-21 DIAGNOSIS — I6529 Occlusion and stenosis of unspecified carotid artery: Secondary | ICD-10-CM | POA: Diagnosis present

## 2018-04-21 DIAGNOSIS — Z9049 Acquired absence of other specified parts of digestive tract: Secondary | ICD-10-CM | POA: Diagnosis not present

## 2018-04-21 DIAGNOSIS — Z981 Arthrodesis status: Secondary | ICD-10-CM

## 2018-04-21 DIAGNOSIS — R053 Chronic cough: Secondary | ICD-10-CM | POA: Diagnosis present

## 2018-04-21 LAB — CBC WITH DIFFERENTIAL/PLATELET
ABS IMMATURE GRANULOCYTES: 0.02 10*3/uL (ref 0.00–0.07)
BASOS ABS: 0 10*3/uL (ref 0.0–0.1)
Basophils Relative: 0 %
Eosinophils Absolute: 0 10*3/uL (ref 0.0–0.5)
Eosinophils Relative: 0 %
HCT: 42.6 % (ref 36.0–46.0)
Hemoglobin: 13.9 g/dL (ref 12.0–15.0)
IMMATURE GRANULOCYTES: 0 %
Lymphocytes Relative: 6 %
Lymphs Abs: 0.6 10*3/uL — ABNORMAL LOW (ref 0.7–4.0)
MCH: 30.3 pg (ref 26.0–34.0)
MCHC: 32.6 g/dL (ref 30.0–36.0)
MCV: 93 fL (ref 80.0–100.0)
Monocytes Absolute: 0.2 10*3/uL (ref 0.1–1.0)
Monocytes Relative: 2 %
NRBC: 0 % (ref 0.0–0.2)
Neutro Abs: 9 10*3/uL — ABNORMAL HIGH (ref 1.7–7.7)
Neutrophils Relative %: 92 %
Platelets: 267 10*3/uL (ref 150–400)
RBC: 4.58 MIL/uL (ref 3.87–5.11)
RDW: 15.2 % (ref 11.5–15.5)
WBC: 9.9 10*3/uL (ref 4.0–10.5)

## 2018-04-21 LAB — BASIC METABOLIC PANEL
Anion gap: 10 (ref 5–15)
BUN: 16 mg/dL (ref 8–23)
CHLORIDE: 108 mmol/L (ref 98–111)
CO2: 22 mmol/L (ref 22–32)
Calcium: 8.8 mg/dL — ABNORMAL LOW (ref 8.9–10.3)
Creatinine, Ser: 1.23 mg/dL — ABNORMAL HIGH (ref 0.44–1.00)
GFR calc Af Amer: 48 mL/min — ABNORMAL LOW (ref >60)
GFR calc non Af Amer: 41 mL/min — ABNORMAL LOW (ref >60)
Glucose, Bld: 129 mg/dL — ABNORMAL HIGH (ref 70–99)
Potassium: 4.1 mmol/L (ref 3.5–5.1)
Sodium: 140 mmol/L (ref 135–145)

## 2018-04-21 LAB — BRAIN NATRIURETIC PEPTIDE: B Natriuretic Peptide: 187.6 pg/mL — ABNORMAL HIGH (ref 0.0–100.0)

## 2018-04-21 MED ORDER — ALBUTEROL SULFATE (2.5 MG/3ML) 0.083% IN NEBU
5.0000 mg | INHALATION_SOLUTION | Freq: Once | RESPIRATORY_TRACT | Status: AC
  Administered 2018-04-22: 5 mg via RESPIRATORY_TRACT
  Filled 2018-04-21: qty 6

## 2018-04-21 MED ORDER — IPRATROPIUM-ALBUTEROL 0.5-2.5 (3) MG/3ML IN SOLN
3.0000 mL | Freq: Once | RESPIRATORY_TRACT | Status: AC
  Administered 2018-04-21: 3 mL via RESPIRATORY_TRACT
  Filled 2018-04-21: qty 3

## 2018-04-21 MED ORDER — IPRATROPIUM BROMIDE 0.02 % IN SOLN
0.5000 mg | Freq: Once | RESPIRATORY_TRACT | Status: AC
  Administered 2018-04-22: 0.5 mg via RESPIRATORY_TRACT
  Filled 2018-04-21: qty 2.5

## 2018-04-21 MED ORDER — DOXYCYCLINE HYCLATE 100 MG PO TABS
100.0000 mg | ORAL_TABLET | Freq: Once | ORAL | Status: AC
  Administered 2018-04-21: 100 mg via ORAL
  Filled 2018-04-21: qty 1

## 2018-04-21 MED ORDER — IPRATROPIUM BROMIDE 0.02 % IN SOLN: 0.5000 mg | Freq: Once | RESPIRATORY_TRACT | Status: DC

## 2018-04-21 MED ORDER — PREDNISONE 10 MG PO TABS: 20.0000 mg | ORAL_TABLET | Freq: Two times a day (BID) | ORAL | 0 refills | Status: DC

## 2018-04-21 NOTE — Addendum Note (Signed)
Addended by: [name] on: 04/21/2018 02:01 PM   Modules accepted: Orders

## 2018-04-21 NOTE — Telephone Encounter (Signed)
Pt's daughter aware referral placed and she also requested med list to be sent over to her. Med list printed and mailed to pt's daughter at [address] [id number], [place] [address].

## 2018-04-21 NOTE — Discharge Instructions (Addendum)
Prednisone as prescribed.  Continue use of your albuterol as before.  Return to the emergency department if you develop severe chest pain, high fevers, productive cough, or other new and concerning symptoms.

## 2018-04-21 NOTE — ED Notes (Signed)
Pt leaving department with family member. NAD. BP noted to be high, per ER provider, advise patient to take home meds upon arrival home.

## 2018-04-21 NOTE — ED Triage Notes (Signed)
Patient is from home, increased shortness of breath.  Patient with asthma, was given 3 duonebs, 125mg  solumedrol with some relief.  Patient does have tight lung sounds per EMS.  She is CAOx4.

## 2018-04-21 NOTE — Telephone Encounter (Signed)
Wants a referral to Neurologist for memory issues

## 2018-04-21 NOTE — ED Provider Notes (Signed)
[facility] EMERGENCY DEPARTMENT Provider Note   CSN: [id number] Arrival date & time: 04/21/18  0128     History   Chief Complaint Chief Complaint  Patient presents with  . Shortness of Breath    HPI [name] is a 83 y.o. female.  Patient is an 83 year old female with history of asthma, hypertension, and prior pneumonia.  She presents today with shortness of breath.  This came on this evening while she was at home.  She had wheezing that made it difficult for her to catch her breath.  She ended up calling 911 when her nebulizer treatments were not helping.  She was given Solu-Medrol and albuterol in route and is now feeling somewhat better.  She does report recent URI symptoms including cough and congestion.  She denies fever or chest pain.  She denies any leg swelling.  The history is provided by the patient.  Shortness of Breath  This is a recurrent problem. The average episode lasts 3 hours. The problem occurs continuously.The current episode started yesterday. The problem has been gradually improving. Associated symptoms include cough. Pertinent negatives include no fever, no sputum production, no chest pain, no leg pain and no leg swelling. Treatments tried: Nebulizer. The treatment provided moderate relief. Associated medical issues include asthma and pneumonia. Associated medical issues do not include PE, CAD or heart failure.    Past Medical History:  Diagnosis Date  . Allergic rhinitis   . Anxiety   . Arthritis   . Asthma   . CAP (community acquired pneumonia) 11/26/2017  . Depression   . Dyspnea    with exertion   . Elevated troponin 04/05/2016  . Essential hypertension, benign   . GERD (gastroesophageal reflux disease)   . Headache    occiptal neuralgia - left   . History of blood transfusion    "related to miscarriage"  . Left bundle branch block   . Memory loss   . Mixed hyperlipidemia   . Occipital neuralgia of left side   .  Osteopenia   . 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 Urinary urgency     Patient Active Problem List   Diagnosis Date Noted  . Occipital neuralgia of left side 03/20/2018  . Glenohumeral arthritis, right 02/12/2018  . Tobacco use 12/24/2017  . GOLD COPD 0 with Asthmatic Component    . Biceps tendinosis of right shoulder 08/04/2017  . Pre-operative cardiovascular examination 08/01/2017  . Carotid artery disease (HCC) 07/03/2017  . DOE (dyspnea on exertion) 07/03/2017  . Chronic right shoulder pain 12/27/2016  . Constipation 04/11/2016  . Chronic diastolic heart failure (HCC) 04/07/2016  . Hyperglycemia 04/05/2016  . Syncope 11/15/2015  . Dehydration 10/05/2015  . UTI (lower urinary tract infection) 10/05/2015  . Mild dementia (HCC) 09/13/2015  . Memory problem 05/07/2015  . Neck pain 04/06/2015  . Insomnia 12/10/2014  . BMI 28.0-28.9,adult 12/10/2014  . Osteopenia 05/19/2014  . GAD (generalized anxiety disorder) 01/06/2014  . Depression 01/06/2014  . GERD (gastroesophageal reflux disease) 01/06/2014  . Vitamin D deficiency 01/06/2014  . Overactive bladder 01/06/2014  . Secondary cardiomyopathy (HCC) 10/06/2013  . Left bundle branch block 10/06/2013  . Dizziness 10/04/2013  . Osteoarthritis of right hip 03/14/2013  . Cough 02/01/2011  . Reflux 02/01/2011  . Allergic rhinitis 12/07/2008  . Chronic cough 12/07/2008  . Hepatitis B virus infection 08/16/2008  . Hyperlipidemia 08/16/2008  . Essential hypertension 08/16/2008  . Primary osteoarthritis, right shoulder  08/16/2008  . DISC DEGENERATION 07/24/2007    Past Surgical History:  Procedure Laterality Date  . APPENDECTOMY    . BACK SURGERY    . CARDIAC CATHETERIZATION  2010   Normal coronaries  . CATARACT EXTRACTION W/PHACO  04/16/2011   Procedure: CATARACT EXTRACTION PHACO AND INTRAOCULAR LENS PLACEMENT (IOC);  Surgeon: Susa Simmonds;  Location: [facility];  Service: Ophthalmology;   Laterality: Left;  CDE=11.35  . CATARACT EXTRACTION W/PHACO  01/28/2012   Procedure: CATARACT EXTRACTION PHACO AND INTRAOCULAR LENS PLACEMENT (IOC);  Surgeon: Susa Simmonds, MD;  Location: [facility];  Service: Ophthalmology;  Laterality: Right;  CDI:8.15  . CYSTO WITH HYDRODISTENSION  05/29/2012   Procedure: CYSTOSCOPY/HYDRODISTENSION;  Surgeon: Martina Sinner, MD;  Location: [facility];  Service: Urology;  Laterality: N/A;  INSTILLATION OF MARCAINE AND PYRIDIUM   . DILATION AND CURETTAGE OF UTERUS  <hysterectomy"  . HAMMER TOE SURGERY Bilateral 11/22/11   [facility], [name]  . JOINT REPLACEMENT    . LAPAROSCOPIC CHOLECYSTECTOMY  1991  . LUMBAR LAMINECTOMY  2011  . LUMBAR LAMINECTOMY/DECOMPRESSION MICRODISCECTOMY Left 06/06/2012   Procedure: LUMBAR LAMINECTOMY/DECOMPRESSION MICRODISCECTOMY 1 LEVEL;  Surgeon: Maeola Harman, MD;  Location: [facility];  Service: Neurosurgery;  Laterality: Left;  Left Lumbar two-three Laminectomy for resection of synovial cyst  . SHOULDER ARTHROSCOPY Right 02/12/2018   SHOULDER ARTHROSCOPY, BICEPS DEBRIDEMENT, REMOVAL OF LOOSE BODIES  . SHOULDER ARTHROSCOPY Right 02/12/2018   Procedure: RIGHT SHOULDER ARTHROSCOPY, BICEPS DEBRIDEMENT, REMOVAL OF LOOSE BODIES;  Surgeon: Eldred Manges, MD;  Location: [facility];  Service: Orthopedics;  Laterality: Right;  . SHOULDER OPEN ROTATOR CUFF REPAIR Right 2011  . TONSILLECTOMY    . TOTAL HIP ARTHROPLASTY Right 03/13/2013   Procedure: TOTAL HIP ARTHROPLASTY;  Surgeon: Nestor Lewandowsky, MD;  Location: [facility];  Service: Orthopedics;  Laterality: Right;  Marland Kitchen VAGINAL HYSTERECTOMY       OB History    Gravida  5   Para  5   Term  5   Preterm      AB      Living  4     SAB      TAB      Ectopic      Multiple      Live Births               Home Medications    Prior to Admission medications   Medication Sig Start Date End Date Taking? Authorizing Provider  aspirin EC 81 MG tablet Take 81 mg by mouth  daily.    [provider]  azelastine (ASTELIN) 0.1 % nasal spray Place 2 sprays into both nostrils 2 (two) times daily. Use in each nostril as directed Patient taking differently: Place 2 sprays into both nostrils 2 (two) times daily as needed for allergies. Use in each nostril as directed 11/28/17   Rhetta Mura, MD  benzonatate (TESSALON PERLES) 100 MG capsule Take 1 capsule (100 mg total) by mouth 3 (three) times daily as needed for cough. 04/14/18   Daphine Deutscher, Mary-Margaret, FNP  Biotin 5 MG TABS Take 1 tablet by mouth daily.    [provider]  buPROPion (WELLBUTRIN SR) 150 MG 12 hr tablet TAKE (1) TABLET TWICE A DAY. 03/20/18   [name], MD  carvedilol (COREG) 6.25 MG tablet TAKE  (1)  TABLET TWICE A DAY WITH MEALS (BREAKFAST AND SUPPER) 03/20/18   [name], MD  DYMISTA [phone] MCG/ACT SUSP One spray per nostril  twice a day 08/06/16   [name], MD  esomeprazole (NEXIUM) 40 MG capsule Take 1 capsule (40 mg total) by mouth daily before breakfast. 03/20/18   [name], MD  furosemide (LASIX) 20 MG tablet Take 1 tablet (20 mg total) by mouth daily. Pt to take 40 mg for 2 days then decrease to 20 mg daily Patient taking differently: Take 20 mg by mouth daily.  07/04/17 02/10/18  Rollene Rotunda, MD  gabapentin (NEURONTIN) 400 MG capsule Take 1 capsule (400 mg total) by mouth 3 (three) times daily. 04/17/18   [name], MD  HYDROcodone-acetaminophen (NORCO) 5-325 MG tablet Take 1 tablet by mouth every 6 (six) hours as needed for moderate pain. 02/12/18   Eldred Manges, MD  HYDROcodone-acetaminophen (NORCO/VICODIN) 5-325 MG tablet Take 1 tablet by mouth every 4 (four) hours as needed for moderate pain. 02/13/18   Eldred Manges, MD  loratadine (CLARITIN) 10 MG tablet Take 1 tablet (10 mg total) by mouth daily. Patient not taking: Reported on 04/14/2018 08/28/17   [name], MD  losartan (COZAAR) 50 MG tablet Take 1/2 tablet (25 mg total) by  mouth daily. Patient taking differently: Take 25 mg by mouth daily. Take 1/2 tablet (25 mg total) by mouth daily. 08/28/17   [name], MD  Multiple Vitamins-Minerals (CENTRUM SILVER PO) Take 1 tablet by mouth daily.    [provider]  potassium chloride SA (K-DUR,KLOR-CON) 20 MEQ tablet Take 1 tablet (20 mEq total) by mouth daily. 07/04/17   Rollene Rotunda, MD  pravastatin (PRAVACHOL) 80 MG tablet Take 1 tablet (80 mg total) by mouth daily. 08/07/17   Rollene Rotunda, MD  raloxifene (EVISTA) 60 MG tablet TAKE 1 TABLET DAILY 03/14/18   [name], MD  Spacer/Aero-Holding Chambers (AEROCHAMBER MV) inhaler Use as instructed 12/24/17   Coral Ceo, NP  SYMBICORT 160-4.5 MCG/ACT inhaler Inhale 2 puffs into the lungs 2 (two) times daily. 03/10/18   [name], MD  vitamin B-12 (CYANOCOBALAMIN) 1000 MCG tablet Take 1,000 mcg by mouth daily.    [provider]    Family History Family History  Problem Relation Age of Onset  . Diabetes Father   . Diabetes Brother   . Cancer Brother 40       colon  . Diabetes Brother   . Diabetes Brother   . Hypertension Sister   . Diabetes Sister   . Diabetes Sister   . Diabetes Other   . Allergies Other   . Diabetes Son   . Colon cancer Neg Hx   . Anesthesia problems Neg Hx   . Hypotension Neg Hx   . Malignant hyperthermia Neg Hx   . Pseudochol deficiency Neg Hx     Social History Social History   Tobacco Use  . Smoking status: Current Some Day Smoker    Years: 60.00    Types: Cigarettes  . Smokeless tobacco: Never Used  . Tobacco comment: 1- 2 cigs a day-  1 pkg will  last 2 weeks  Substance Use Topics  . Alcohol use: Not Currently    Comment: 11/27/2017 "drank some in my 20s"  . Drug use: No     Allergies   Lisinopril-hydrochlorothiazide; Codeine; Penicillins; Aricept [donepezil hcl]; and Namenda [memantine hcl]   Review of Systems Review of Systems  Constitutional: Negative for fever.  Respiratory:  Positive for cough and shortness of breath. Negative for sputum production.   Cardiovascular: Negative for chest pain  and leg swelling.  All other systems reviewed and are negative.    Physical Exam Updated Vital Signs BP (!) 144/86 (BP Location: Right Arm)   Pulse 88   Temp 99 F (37.2 C) (Oral)   Resp 20   SpO2 95%   Physical Exam Vitals signs and nursing note reviewed.  Constitutional:      General: She is not in acute distress.    Appearance: She is well-developed. She is not diaphoretic.  HENT:     Head: Normocephalic and atraumatic.  Neck:     Musculoskeletal: Normal range of motion and neck supple.  Cardiovascular:     Rate and Rhythm: Normal rate and regular rhythm.     Heart sounds: No murmur. No friction rub. No gallop.   Pulmonary:     Effort: Pulmonary effort is normal. No respiratory distress.     Breath sounds: Wheezing present.     Comments: There are expiratory wheezes audible bilaterally. Abdominal:     General: Bowel sounds are normal. There is no distension.     Palpations: Abdomen is soft.     Tenderness: There is no abdominal tenderness.  Musculoskeletal: Normal range of motion.     Right lower leg: She exhibits no tenderness. No edema.     Left lower leg: She exhibits no tenderness. No edema.     Comments: Homans sign absent bilaterally.  Skin:    General: Skin is warm and dry.  Neurological:     Mental Status: She is alert and oriented to person, place, and time.      ED Treatments / Results  Labs (all labs ordered are listed, but only abnormal results are displayed) Labs Reviewed  BASIC METABOLIC PANEL  BRAIN NATRIURETIC PEPTIDE  CBC WITH DIFFERENTIAL/PLATELET    EKG EKG Interpretation  Date/Time:  Monday April 21 2018 01:36:15 EST Ventricular Rate:  89 PR Interval:    QRS Duration: 134 QT Interval:  406 QTC Calculation: 494 R Axis:   45 Text Interpretation:  Sinus rhythm Probable left atrial enlargement Left bundle branch  block Confirmed by Geoffery Lyons ([id number]) on 04/21/2018 1:42:04 AM   Radiology No results found.  Procedures Procedures (including critical care time)  Medications Ordered in ED Medications  ipratropium-albuterol (DUONEB) 0.5-2.5 (3) MG/3ML nebulizer solution 3 mL (has no administration in time range)     Initial Impression / Assessment and Plan / ED Course  I have reviewed the triage vital signs and the nursing notes.  Pertinent labs & imaging results that were available during my care of the patient were reviewed by me and considered in my medical decision making (see chart for details).  Patient presents here with complaints of shortness of breath that appears to be related to an exacerbation of asthma.  She is feeling better after receiving steroids and breathing treatments.  Oxygen saturations are in the upper 90s and she is resting comfortably.  Work-up reveals no evidence for pneumonia or cardiac etiology.  She will be discharged with steroids and continued use of her nebulizer.  Final Clinical Impressions(s) / ED Diagnoses   Final diagnoses:  None    ED Discharge Orders    None       Geoffery Lyons, MD 04/21/18 [phone]

## 2018-04-21 NOTE — ED Triage Notes (Signed)
Pt was seen yesterday at [facility] for same, EMS called out for SOB, upon arrival O2 sats upper 90s with wheezing, pt given 3 Albuterol treatments, 125mg  Solumedrol en route, CBG 132, v/s wnl

## 2018-04-21 NOTE — ED Provider Notes (Addendum)
[facility] EMERGENCY DEPARTMENT Provider Note   CSN: [id number] Arrival date & time: 04/21/18  2212     History   Chief Complaint Chief Complaint  Patient presents with  . Shortness of Breath    HPI [name] is a 83 y.o. female.  HPI   Patient is an 83 year old female with a history of asthma, hypertension, GERD, hyperlipidemia, who presents the emergency department today for evaluation of shortness of breath.  Symptoms have been ongoing for several days.  Associated with productive cough, chills, sweats, rhinorrhea, congestion and wheezing.  Of note, she was seen in the ED earlier today and diagnosed with asthma exacerbation.  She received 125 Solu-Medrol and several albuterol nebulizer treatments by EMS prior to her arrival to the ED.  On arrival to the ED she had felt somewhat improved, she was maintaining her oxygen saturation and was discharged on steroids.  She states that after arriving home she fell asleep, when she woke up she felt more short of breath than prior so she contacted EMS.  In route with EMS, she again received 125 Solu-Medrol as well as 3 albuterol nebulizer treatments.  States she still feels short of breath right now and has chest tightness.  She denies any chest pain.  No lower extremity swelling or calf pain.  No hospital admissions or surgeries in the last 30 days.  No hemoptysis.  No history of DVT/PE or cancer.  Spoke with patient's daughters who assisted with the history.  They state that patient has a history of COPD.  Past Medical History:  Diagnosis Date  . Allergic rhinitis   . Anxiety   . Arthritis   . Asthma   . CAP (community acquired pneumonia) 11/26/2017  . Depression   . Dyspnea    with exertion   . Elevated troponin 04/05/2016  . Essential hypertension, benign   . GERD (gastroesophageal reflux disease)   . Headache    occiptal neuralgia - left   . History of blood transfusion    "related to miscarriage"  . Left bundle branch  block   . Memory loss   . Mixed hyperlipidemia   . Occipital neuralgia of left side   . Osteopenia   . 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 Urinary urgency     Patient Active Problem List   Diagnosis Date Noted  . SOB (shortness of breath) 04/22/2018  . COPD exacerbation (HCC)   . Occipital neuralgia of left side 03/20/2018  . Glenohumeral arthritis, right 02/12/2018  . Tobacco use 12/24/2017  . GOLD COPD 0 with Asthmatic Component    . Biceps tendinosis of right shoulder 08/04/2017  . Pre-operative cardiovascular examination 08/01/2017  . Carotid artery disease (HCC) 07/03/2017  . DOE (dyspnea on exertion) 07/03/2017  . Chronic right shoulder pain 12/27/2016  . Constipation 04/11/2016  . Chronic diastolic heart failure (HCC) 04/07/2016  . Hyperglycemia 04/05/2016  . Syncope 11/15/2015  . Dehydration 10/05/2015  . UTI (lower urinary tract infection) 10/05/2015  . Mild dementia (HCC) 09/13/2015  . Memory problem 05/07/2015  . Neck pain 04/06/2015  . Insomnia 12/10/2014  . BMI 28.0-28.9,adult 12/10/2014  . Osteopenia 05/19/2014  . GAD (generalized anxiety disorder) 01/06/2014  . Depression 01/06/2014  . GERD (gastroesophageal reflux disease) 01/06/2014  . Vitamin D deficiency 01/06/2014  . Overactive bladder 01/06/2014  . Secondary cardiomyopathy (HCC) 10/06/2013  . Left bundle branch block 10/06/2013  . Dizziness 10/04/2013  . Osteoarthritis of right hip  03/14/2013  . Cough 02/01/2011  . Reflux 02/01/2011  . Allergic rhinitis 12/07/2008  . Chronic cough 12/07/2008  . Hepatitis B virus infection 08/16/2008  . Hyperlipidemia 08/16/2008  . Essential hypertension 08/16/2008  . Primary osteoarthritis, right shoulder 08/16/2008  . DISC DEGENERATION 07/24/2007    Past Surgical History:  Procedure Laterality Date  . APPENDECTOMY    . BACK SURGERY    . CARDIAC CATHETERIZATION  2010   Normal coronaries  . CATARACT EXTRACTION W/PHACO   04/16/2011   Procedure: CATARACT EXTRACTION PHACO AND INTRAOCULAR LENS PLACEMENT (IOC);  Surgeon: Susa Simmonds;  Location: [facility];  Service: Ophthalmology;  Laterality: Left;  CDE=11.35  . CATARACT EXTRACTION W/PHACO  01/28/2012   Procedure: CATARACT EXTRACTION PHACO AND INTRAOCULAR LENS PLACEMENT (IOC);  Surgeon: Susa Simmonds, MD;  Location: [facility];  Service: Ophthalmology;  Laterality: Right;  CDI:8.15  . CYSTO WITH HYDRODISTENSION  05/29/2012   Procedure: CYSTOSCOPY/HYDRODISTENSION;  Surgeon: Martina Sinner, MD;  Location: [facility];  Service: Urology;  Laterality: N/A;  INSTILLATION OF MARCAINE AND PYRIDIUM   . DILATION AND CURETTAGE OF UTERUS  <hysterectomy"  . HAMMER TOE SURGERY Bilateral 11/22/11   [facility], [name]  . JOINT REPLACEMENT    . LAPAROSCOPIC CHOLECYSTECTOMY  1991  . LUMBAR LAMINECTOMY  2011  . LUMBAR LAMINECTOMY/DECOMPRESSION MICRODISCECTOMY Left 06/06/2012   Procedure: LUMBAR LAMINECTOMY/DECOMPRESSION MICRODISCECTOMY 1 LEVEL;  Surgeon: Maeola Harman, MD;  Location: [facility];  Service: Neurosurgery;  Laterality: Left;  Left Lumbar two-three Laminectomy for resection of synovial cyst  . SHOULDER ARTHROSCOPY Right 02/12/2018   SHOULDER ARTHROSCOPY, BICEPS DEBRIDEMENT, REMOVAL OF LOOSE BODIES  . SHOULDER ARTHROSCOPY Right 02/12/2018   Procedure: RIGHT SHOULDER ARTHROSCOPY, BICEPS DEBRIDEMENT, REMOVAL OF LOOSE BODIES;  Surgeon: Eldred Manges, MD;  Location: [facility];  Service: Orthopedics;  Laterality: Right;  . SHOULDER OPEN ROTATOR CUFF REPAIR Right 2011  . TONSILLECTOMY    . TOTAL HIP ARTHROPLASTY Right 03/13/2013   Procedure: TOTAL HIP ARTHROPLASTY;  Surgeon: Nestor Lewandowsky, MD;  Location: [facility];  Service: Orthopedics;  Laterality: Right;  Marland Kitchen VAGINAL HYSTERECTOMY       OB History    Gravida  5   Para  5   Term  5   Preterm      AB      Living  4     SAB      TAB      Ectopic      Multiple      Live Births               Home  Medications    Prior to Admission medications   Medication Sig Start Date End Date Taking? Authorizing Provider  albuterol (PROVENTIL HFA;VENTOLIN HFA) 108 (90 Base) MCG/ACT inhaler Inhale 1-2 puffs into the lungs every 6 (six) hours as needed for wheezing or shortness of breath.   Yes [provider]  aspirin EC 81 MG tablet Take 81 mg by mouth every evening.    Yes [provider]  azelastine (ASTELIN) 0.1 % nasal spray Place 2 sprays into both nostrils 2 (two) times daily. Use in each nostril as directed Patient taking differently: Place 2 sprays into both nostrils 2 (two) times daily as needed for allergies. Use in each nostril as directed 11/28/17  Yes Rhetta Mura, MD  benzonatate (TESSALON PERLES) 100 MG capsule Take 1 capsule (100 mg total) by mouth 3 (three) times daily as needed for cough. 04/14/18  Yes  Daphine Deutscher, Mary-Margaret, FNP  Biotin 5 MG TABS Take 1 tablet by mouth every morning.    Yes [provider]  buPROPion (WELLBUTRIN SR) 150 MG 12 hr tablet TAKE (1) TABLET TWICE A DAY. Patient taking differently: Take 150 mg by mouth 2 (two) times daily.  03/20/18  Yes [name], MD  carvedilol (COREG) 6.25 MG tablet TAKE  (1)  TABLET TWICE A DAY WITH MEALS (BREAKFAST AND SUPPER) Patient taking differently: Take 6.25 mg by mouth 2 (two) times daily with a meal.  03/20/18  Yes [name], MD  escitalopram (LEXAPRO) 10 MG tablet Take 10 mg by mouth every morning.   Yes [provider]  esomeprazole (NEXIUM) 40 MG capsule Take 1 capsule (40 mg total) by mouth daily before breakfast. 03/20/18  Yes [name], MD  fluticasone furoate-vilanterol (BREO ELLIPTA) 200-25 MCG/INH AEPB Inhale 1 puff into the lungs daily.   Yes [provider]  furosemide (LASIX) 20 MG tablet Take 1 tablet (20 mg total) by mouth daily. Pt to take 40 mg for 2 days then decrease to 20 mg daily Patient taking differently: Take 20 mg by mouth every morning.   07/04/17 04/21/18 Yes Rollene Rotunda, MD  gabapentin (NEURONTIN) 100 MG capsule Take 300 mg by mouth 2 (two) times daily.   Yes [provider]  loratadine (CLARITIN) 10 MG tablet Take 1 tablet (10 mg total) by mouth daily. Patient taking differently: Take 10 mg by mouth every morning.  08/28/17  Yes [name], MD  losartan (COZAAR) 50 MG tablet Take 1/2 tablet (25 mg total) by mouth daily. Patient taking differently: Take 25 mg by mouth every morning.  08/28/17  Yes [name], MD  Multiple Vitamins-Minerals (CENTRUM SILVER PO) Take 1 tablet by mouth every morning.    Yes [provider]  naproxen sodium (ALEVE) 220 MG tablet Take 220 mg by mouth daily as needed (for pain).   Yes [provider]  potassium chloride SA (K-DUR,KLOR-CON) 20 MEQ tablet Take 1 tablet (20 mEq total) by mouth daily. Patient taking differently: Take 20 mEq by mouth every morning.  07/04/17  Yes Rollene Rotunda, MD  raloxifene (EVISTA) 60 MG tablet TAKE 1 TABLET DAILY Patient taking differently: Take 60 mg by mouth every morning.  03/14/18  Yes [name], MD  SYMBICORT 160-4.5 MCG/ACT inhaler Inhale 2 puffs into the lungs 2 (two) times daily. Patient taking differently: Inhale 2 puffs into the lungs 2 (two) times daily.  03/10/18  Yes [name], MD  vitamin B-12 (CYANOCOBALAMIN) 1000 MCG tablet Take 1,000 mcg by mouth daily.   Yes [provider]  gabapentin (NEURONTIN) 400 MG capsule Take 1 capsule (400 mg total) by mouth 3 (three) times daily. Patient not taking: Reported on 04/21/2018 04/17/18   [name], MD  HYDROcodone-acetaminophen [facility]) 5-325 MG tablet Take 1 tablet by mouth every 6 (six) hours as needed for moderate pain. Patient not taking: Reported on 04/21/2018 02/12/18   Eldred Manges, MD  HYDROcodone-acetaminophen (NORCO/VICODIN) 5-325 MG tablet Take 1 tablet by mouth every 4 (four) hours as needed for moderate pain. Patient not taking:  Reported on 04/21/2018 02/13/18   Eldred Manges, MD  pravastatin (PRAVACHOL) 80 MG tablet Take 1 tablet (80 mg total) by mouth daily. 08/07/17   Rollene Rotunda, MD  predniSONE (DELTASONE) 10 MG tablet Take 2 tablets (20 mg total) by mouth 2 (two) times daily with a meal. 04/21/18  Geoffery Lyons, MD  Spacer/Aero-Holding Chambers (AEROCHAMBER MV) inhaler Use as instructed 12/24/17   Coral Ceo, NP    Family History Family History  Problem Relation Age of Onset  . Diabetes Father   . Diabetes Brother   . Cancer Brother 66       colon  . Diabetes Brother   . Diabetes Brother   . Hypertension Sister   . Diabetes Sister   . Diabetes Sister   . Diabetes Other   . Allergies Other   . Diabetes Son   . Colon cancer Neg Hx   . Anesthesia problems Neg Hx   . Hypotension Neg Hx   . Malignant hyperthermia Neg Hx   . Pseudochol deficiency Neg Hx     Social History Social History   Tobacco Use  . Smoking status: Current Some Day Smoker    Years: 60.00    Types: Cigarettes  . Smokeless tobacco: Never Used  . Tobacco comment: 1- 2 cigs a day-  1 pkg will  last 2 weeks  Substance Use Topics  . Alcohol use: Not Currently    Comment: 11/27/2017 "drank some in my 20s"  . Drug use: No     Allergies   Lisinopril-hydrochlorothiazide; Codeine; Penicillins; Aricept [donepezil hcl]; and Namenda [memantine hcl]   Review of Systems Review of Systems  Constitutional: Positive for chills and diaphoresis.  HENT: Positive for congestion and rhinorrhea.   Eyes: Negative for visual disturbance.  Respiratory: Positive for cough, shortness of breath and wheezing.   Cardiovascular: Negative for chest pain and leg swelling.  Gastrointestinal: Negative for abdominal pain, constipation, diarrhea, nausea and vomiting.  Genitourinary: Negative for flank pain.  Musculoskeletal: Negative for back pain.  Skin: Negative for rash.  Neurological: Negative for headaches.     Physical Exam Updated  Vital Signs BP (!) 138/57   Pulse 68   Temp 98.4 F (36.9 C) (Oral)   Resp 12   Ht 5\' 2"  (1.575 m)   Wt 73 kg   SpO2 98%   BMI 29.44 kg/m   Physical Exam Vitals signs and nursing note reviewed.  Constitutional:      General: She is not in acute distress.    Appearance: She is well-developed.  HENT:     Head: Normocephalic and atraumatic.     Mouth/Throat:     Mouth: Mucous membranes are moist.  Eyes:     Conjunctiva/sclera: Conjunctivae normal.  Neck:     Musculoskeletal: Neck supple.  Cardiovascular:     Rate and Rhythm: Normal rate and regular rhythm.     Heart sounds: No murmur.  Pulmonary:     Effort: Pulmonary effort is normal. Tachypnea present. No respiratory distress.     Breath sounds: Decreased breath sounds and wheezing present. No rhonchi.     Comments: Speaking in full sentences Abdominal:     General: Bowel sounds are normal.     Palpations: Abdomen is soft. There is no mass.     Tenderness: There is no abdominal tenderness. There is no guarding or rebound.  Musculoskeletal:     Right lower leg: She exhibits no tenderness. No edema.     Left lower leg: She exhibits no tenderness. No edema.  Skin:    General: Skin is warm and dry.  Neurological:     Mental Status: She is alert.  Psychiatric:        Mood and Affect: Mood normal.     ED Treatments / Results  Labs (  all labs ordered are listed, but only abnormal results are displayed) Labs Reviewed  CBC WITH DIFFERENTIAL/PLATELET - Abnormal; Notable for the following components:      Result Value   WBC 12.3 (*)    Neutro Abs 11.1 (*)    All other components within normal limits  INFLUENZA PANEL BY PCR (TYPE A & B)  BASIC METABOLIC PANEL  TROPONIN I  BASIC METABOLIC PANEL  CBC    EKG EKG Interpretation  Date/Time:  Monday April 21 2018 22:33:48 EST Ventricular Rate:  81 PR Interval:    QRS Duration: 136 QT Interval:  433 QTC Calculation: 503 R Axis:   60 Text Interpretation:  Sinus  rhythm Left bundle branch block Confirmed by Bethann BerkshireZammit, Joseph [phone]([id number]) on 04/21/2018 11:21:01 PM   Radiology Dg Chest 2 View  Result Date: 04/22/2018 CLINICAL DATA:  Shortness of breath and cough EXAM: CHEST - 2 VIEW COMPARISON:  04/21/2018 FINDINGS: The heart size and mediastinal contours are within normal limits. Both lungs are clear. The visualized skeletal structures are unremarkable. IMPRESSION: No active cardiopulmonary disease. Electronically Signed   By: Alcide CleverMark  Lukens M.D.   On: 04/22/2018 00:07   Dg Chest 2 View  Result Date: 04/21/2018 CLINICAL DATA:  Acute onset of shortness of breath. EXAM: CHEST - 2 VIEW COMPARISON:  Chest radiograph performed 11/28/2017 FINDINGS: The lungs are well-aerated and clear. There is no evidence of focal opacification, pleural effusion or pneumothorax. The heart is borderline normal in size. No acute osseous abnormalities are seen. Clips are noted within the right upper quadrant, reflecting prior cholecystectomy. Lumbar spinal fusion hardware is partially imaged. Degenerative change is noted at the glenohumeral joints bilaterally. IMPRESSION: No acute cardiopulmonary process seen. Electronically Signed   By: Roanna RaiderJeffery  Chang M.D.   On: 04/21/2018 02:10    Procedures Procedures (including critical care time)  Medications Ordered in ED Medications  aspirin EC tablet 81 mg (has no administration in time range)  pravastatin (PRAVACHOL) tablet 80 mg (has no administration in time range)  Biotin TABS 5 mg (has no administration in time range)  vitamin B-12 (CYANOCOBALAMIN) tablet 1,000 mcg (has no administration in time range)  benzonatate (TESSALON) capsule 100 mg (has no administration in time range)  predniSONE (DELTASONE) tablet 20 mg (has no administration in time range)  escitalopram (LEXAPRO) tablet 10 mg (has no administration in time range)  fluticasone furoate-vilanterol (BREO ELLIPTA) 200-25 MCG/INH 1 puff (has no administration in time range)    naproxen sodium (ALEVE) tablet 220 mg (has no administration in time range)  albuterol (PROVENTIL HFA;VENTOLIN HFA) 108 (90 Base) MCG/ACT inhaler 1-2 puff (has no administration in time range)  gabapentin (NEURONTIN) capsule 300 mg (has no administration in time range)  sodium chloride flush (NS) 0.9 % injection 3 mL (has no administration in time range)  sodium chloride flush (NS) 0.9 % injection 3 mL (has no administration in time range)  0.9 %  sodium chloride infusion (has no administration in time range)  albuterol (PROVENTIL) (2.5 MG/3ML) 0.083% nebulizer solution 2.5 mg (has no administration in time range)  albuterol (PROVENTIL) (2.5 MG/3ML) 0.083% nebulizer solution 2.5 mg (has no administration in time range)  ipratropium (ATROVENT) nebulizer solution 0.5 mg (has no administration in time range)  guaiFENesin (MUCINEX) 12 hr tablet 600 mg (has no administration in time range)  ipratropium (ATROVENT) nebulizer solution 0.5 mg (0.5 mg Nebulization Given 04/22/18 0000)  doxycycline (VIBRA-TABS) tablet 100 mg (100 mg Oral Given 04/21/18 2339)  albuterol (PROVENTIL) (2.5 MG/3ML)  0.083% nebulizer solution 5 mg (5 mg Nebulization Given 04/22/18 0000)     Initial Impression / Assessment and Plan / ED Course  I have reviewed the triage vital signs and the nursing notes.  Pertinent labs & imaging results that were available during my care of the patient were reviewed by me and considered in my medical decision making (see chart for details).     Final Clinical Impressions(s) / ED Diagnoses   Final diagnoses:  COPD exacerbation (HCC)   Patient presenting for evaluation of shortness of breath associated with cough, wheezing and other URI symptoms.  No fevers today.  Was recently seen in the ED and diagnosed with asthma exacerbation.  Was discharged home on p.o. steroids however returns today continuing to have shortness of breath that feels worse than prior.  Exam she has decreased lung  sounds throughout with diffuse expiratory wheezing.  She has received 125 Solu-Medrol as well as 3 albuterol treatments prior to arrival.  CBC with slight leukocytosis, no anemia. BMP pending Trop elevated at 0.04, unchanged and chronically elevated at this level Influenza negative. EKG with NSR, LBBB.  CXR without evidence of pneumonia.  She was given two of atrovent and on re-eval she states that she feels somewhat improved but does not feel back to baseline. Because patient was recently seen in the ED and is continuing to have shortness of breath and worsening of her symptoms, will plan for admission for further treatment of her symptoms given that she has failed outpatient therapy.  Case discussed with Dr. Lynelle Doctor who agrees with plan for admission  1:23 PM CONSULT with Dr. Onalee Hua who accepts patient for admission.   ED Discharge Orders    None       Rayne Du 04/22/18 0142    Devoria Albe, MD 04/22/18 0144    Karrie Meres, PA-C 04/22/18 0145    Devoria Albe, MD 04/22/18 [phone]

## 2018-04-21 NOTE — ED Notes (Signed)
Spoke to pt's daughter, who lives in [place], daughter is attempting to find pt a ride home

## 2018-04-22 DIAGNOSIS — I5032 Chronic diastolic (congestive) heart failure: Secondary | ICD-10-CM | POA: Diagnosis not present

## 2018-04-22 DIAGNOSIS — J449 Chronic obstructive pulmonary disease, unspecified: Secondary | ICD-10-CM | POA: Diagnosis not present

## 2018-04-22 DIAGNOSIS — Z9841 Cataract extraction status, right eye: Secondary | ICD-10-CM | POA: Diagnosis not present

## 2018-04-22 DIAGNOSIS — Z880 Allergy status to penicillin: Secondary | ICD-10-CM | POA: Diagnosis not present

## 2018-04-22 DIAGNOSIS — R0602 Shortness of breath: Secondary | ICD-10-CM | POA: Diagnosis present

## 2018-04-22 DIAGNOSIS — J441 Chronic obstructive pulmonary disease with (acute) exacerbation: Principal | ICD-10-CM

## 2018-04-22 DIAGNOSIS — Z961 Presence of intraocular lens: Secondary | ICD-10-CM | POA: Diagnosis not present

## 2018-04-22 DIAGNOSIS — Z888 Allergy status to other drugs, medicaments and biological substances status: Secondary | ICD-10-CM | POA: Diagnosis not present

## 2018-04-22 DIAGNOSIS — I10 Essential (primary) hypertension: Secondary | ICD-10-CM

## 2018-04-22 DIAGNOSIS — F0390 Unspecified dementia without behavioral disturbance: Secondary | ICD-10-CM | POA: Diagnosis not present

## 2018-04-22 DIAGNOSIS — I110 Hypertensive heart disease with heart failure: Secondary | ICD-10-CM | POA: Diagnosis not present

## 2018-04-22 DIAGNOSIS — Z885 Allergy status to narcotic agent status: Secondary | ICD-10-CM | POA: Diagnosis not present

## 2018-04-22 DIAGNOSIS — R05 Cough: Secondary | ICD-10-CM | POA: Diagnosis not present

## 2018-04-22 DIAGNOSIS — M8580 Other specified disorders of bone density and structure, unspecified site: Secondary | ICD-10-CM | POA: Diagnosis not present

## 2018-04-22 DIAGNOSIS — I447 Left bundle-branch block, unspecified: Secondary | ICD-10-CM | POA: Diagnosis not present

## 2018-04-22 DIAGNOSIS — K219 Gastro-esophageal reflux disease without esophagitis: Secondary | ICD-10-CM | POA: Diagnosis not present

## 2018-04-22 DIAGNOSIS — Z87891 Personal history of nicotine dependence: Secondary | ICD-10-CM | POA: Diagnosis not present

## 2018-04-22 DIAGNOSIS — F411 Generalized anxiety disorder: Secondary | ICD-10-CM | POA: Diagnosis not present

## 2018-04-22 DIAGNOSIS — M1990 Unspecified osteoarthritis, unspecified site: Secondary | ICD-10-CM | POA: Diagnosis not present

## 2018-04-22 DIAGNOSIS — I429 Cardiomyopathy, unspecified: Secondary | ICD-10-CM | POA: Diagnosis not present

## 2018-04-22 DIAGNOSIS — E782 Mixed hyperlipidemia: Secondary | ICD-10-CM | POA: Diagnosis not present

## 2018-04-22 DIAGNOSIS — Z9842 Cataract extraction status, left eye: Secondary | ICD-10-CM | POA: Diagnosis not present

## 2018-04-22 LAB — CBC WITH DIFFERENTIAL/PLATELET
Abs Immature Granulocytes: 0.03 10*3/uL (ref 0.00–0.07)
Basophils Absolute: 0 10*3/uL (ref 0.0–0.1)
Basophils Relative: 0 %
Eosinophils Absolute: 0 10*3/uL (ref 0.0–0.5)
Eosinophils Relative: 0 %
HCT: 43.9 % (ref 36.0–46.0)
Hemoglobin: 14.1 g/dL (ref 12.0–15.0)
Immature Granulocytes: 0 %
Lymphocytes Relative: 7 %
Lymphs Abs: 0.9 10*3/uL (ref 0.7–4.0)
MCH: 29.4 pg (ref 26.0–34.0)
MCHC: 32.1 g/dL (ref 30.0–36.0)
MCV: 91.6 fL (ref 80.0–100.0)
MONO ABS: 0.3 10*3/uL (ref 0.1–1.0)
MONOS PCT: 2 %
Neutro Abs: 11.1 10*3/uL — ABNORMAL HIGH (ref 1.7–7.7)
Neutrophils Relative %: 91 %
Platelets: 305 10*3/uL (ref 150–400)
RBC: 4.79 MIL/uL (ref 3.87–5.11)
RDW: 15.5 % (ref 11.5–15.5)
WBC: 12.3 10*3/uL — ABNORMAL HIGH (ref 4.0–10.5)
nRBC: 0 % (ref 0.0–0.2)

## 2018-04-22 LAB — BASIC METABOLIC PANEL
Anion gap: 9 (ref 5–15)
Anion gap: 7 (ref 5–15)
BUN: 20 mg/dL (ref 8–23)
BUN: 20 mg/dL (ref 8–23)
CALCIUM: 8.8 mg/dL — AB (ref 8.9–10.3)
CO2: 21 mmol/L — ABNORMAL LOW (ref 22–32)
CO2: 22 mmol/L (ref 22–32)
CREATININE: 1.01 mg/dL — AB (ref 0.44–1.00)
Calcium: 9.1 mg/dL (ref 8.9–10.3)
Chloride: 112 mmol/L — ABNORMAL HIGH (ref 98–111)
Chloride: 111 mmol/L (ref 98–111)
Creatinine, Ser: 1.16 mg/dL — ABNORMAL HIGH (ref 0.44–1.00)
GFR calc Af Amer: >60 mL/min (ref >60)
GFR calc Af Amer: 51 mL/min — ABNORMAL LOW (ref >60)
GFR calc non Af Amer: 52 mL/min — ABNORMAL LOW (ref >60)
GFR calc non Af Amer: 44 mL/min — ABNORMAL LOW (ref >60)
GLUCOSE: 142 mg/dL — AB (ref 70–99)
Glucose, Bld: 132 mg/dL — ABNORMAL HIGH (ref 70–99)
Potassium: 4.2 mmol/L (ref 3.5–5.1)
Potassium: 3.8 mmol/L (ref 3.5–5.1)
Sodium: 142 mmol/L (ref 135–145)
Sodium: 140 mmol/L (ref 135–145)

## 2018-04-22 LAB — INFLUENZA PANEL BY PCR (TYPE A & B)
Influenza A By PCR: NEGATIVE
Influenza B By PCR: NEGATIVE

## 2018-04-22 LAB — CBC
HCT: 42.3 % (ref 36.0–46.0)
Hemoglobin: 13.6 g/dL (ref 12.0–15.0)
MCH: 29.3 pg (ref 26.0–34.0)
MCHC: 32.2 g/dL (ref 30.0–36.0)
MCV: 91.2 fL (ref 80.0–100.0)
Platelets: 286 10*3/uL (ref 150–400)
RBC: 4.64 MIL/uL (ref 3.87–5.11)
RDW: 15.5 % (ref 11.5–15.5)
WBC: 11.7 10*3/uL — ABNORMAL HIGH (ref 4.0–10.5)
nRBC: 0 % (ref 0.0–0.2)

## 2018-04-22 LAB — TROPONIN I: Troponin I: 0.04 ng/mL (ref <0.03)

## 2018-04-22 MED ORDER — VITAMIN B-12 1000 MCG PO TABS
1000.0000 ug | ORAL_TABLET | Freq: Every day | ORAL | Status: DC
  Administered 2018-04-22 – 2018-04-23 (×2): 1000 ug via ORAL
  Filled 2018-04-22 (×2): qty 1

## 2018-04-22 MED ORDER — PRAVASTATIN SODIUM 40 MG PO TABS
80.0000 mg | ORAL_TABLET | Freq: Every day | ORAL | Status: DC
  Administered 2018-04-22 – 2018-04-23 (×2): 80 mg via ORAL
  Filled 2018-04-22: qty 2
  Filled 2018-04-22 (×2): qty 1
  Filled 2018-04-22: qty 2

## 2018-04-22 MED ORDER — GUAIFENESIN ER 600 MG PO TB12
600.0000 mg | ORAL_TABLET | Freq: Two times a day (BID) | ORAL | Status: DC
  Administered 2018-04-22 – 2018-04-23 (×4): 600 mg via ORAL
  Filled 2018-04-22 (×4): qty 1

## 2018-04-22 MED ORDER — PREDNISONE 20 MG PO TABS
20.0000 mg | ORAL_TABLET | Freq: Two times a day (BID) | ORAL | Status: DC
  Administered 2018-04-22: 20 mg via ORAL
  Filled 2018-04-22: qty 1

## 2018-04-22 MED ORDER — SODIUM CHLORIDE 0.9 % IV SOLN: 250.0000 mL | INTRAVENOUS | Status: DC | PRN

## 2018-04-22 MED ORDER — ALBUTEROL SULFATE HFA 108 (90 BASE) MCG/ACT IN AERS: 1.0000 | INHALATION_SPRAY | Freq: Four times a day (QID) | RESPIRATORY_TRACT | Status: DC | PRN

## 2018-04-22 MED ORDER — METHYLPREDNISOLONE SODIUM SUCC 40 MG IJ SOLR
40.0000 mg | Freq: Two times a day (BID) | INTRAMUSCULAR | Status: DC
  Administered 2018-04-22 – 2018-04-23 (×3): 40 mg via INTRAVENOUS
  Filled 2018-04-22 (×3): qty 1

## 2018-04-22 MED ORDER — FLUTICASONE FUROATE-VILANTEROL 200-25 MCG/INH IN AEPB
1.0000 | INHALATION_SPRAY | Freq: Every day | RESPIRATORY_TRACT | Status: DC
  Administered 2018-04-22 – 2018-04-23 (×2): 1 via RESPIRATORY_TRACT
  Filled 2018-04-22: qty 28

## 2018-04-22 MED ORDER — IPRATROPIUM-ALBUTEROL 0.5-2.5 (3) MG/3ML IN SOLN
0.5000 mg | Freq: Four times a day (QID) | RESPIRATORY_TRACT | Status: DC
  Administered 2018-04-22: 3 mg via RESPIRATORY_TRACT
  Administered 2018-04-22 (×2): 0.5 mg via RESPIRATORY_TRACT
  Administered 2018-04-23: 2.5 mg via RESPIRATORY_TRACT
  Filled 2018-04-22 (×4): qty 3

## 2018-04-22 MED ORDER — ALBUTEROL SULFATE (2.5 MG/3ML) 0.083% IN NEBU: 2.5000 mg | INHALATION_SOLUTION | RESPIRATORY_TRACT | Status: DC | PRN

## 2018-04-22 MED ORDER — NAPROXEN 250 MG PO TABS: 250.0000 mg | ORAL_TABLET | Freq: Every day | ORAL | Status: DC | PRN

## 2018-04-22 MED ORDER — SODIUM CHLORIDE 0.9% FLUSH: 3.0000 mL | INTRAVENOUS | Status: DC | PRN

## 2018-04-22 MED ORDER — ESCITALOPRAM OXALATE 10 MG PO TABS
10.0000 mg | ORAL_TABLET | Freq: Every morning | ORAL | Status: DC
  Administered 2018-04-22 – 2018-04-23 (×2): 10 mg via ORAL
  Filled 2018-04-22 (×2): qty 1

## 2018-04-22 MED ORDER — BENZONATATE 100 MG PO CAPS
100.0000 mg | ORAL_CAPSULE | Freq: Three times a day (TID) | ORAL | Status: DC | PRN
  Filled 2018-04-22: qty 1

## 2018-04-22 MED ORDER — SODIUM CHLORIDE 0.9% FLUSH
3.0000 mL | Freq: Two times a day (BID) | INTRAVENOUS | Status: DC
  Administered 2018-04-22 – 2018-04-23 (×4): 3 mL via INTRAVENOUS

## 2018-04-22 MED ORDER — ALBUTEROL SULFATE (2.5 MG/3ML) 0.083% IN NEBU: 2.5000 mg | INHALATION_SOLUTION | Freq: Four times a day (QID) | RESPIRATORY_TRACT | Status: DC

## 2018-04-22 MED ORDER — GABAPENTIN 300 MG PO CAPS
300.0000 mg | ORAL_CAPSULE | Freq: Two times a day (BID) | ORAL | Status: DC
  Administered 2018-04-22 – 2018-04-23 (×4): 300 mg via ORAL
  Filled 2018-04-22 (×4): qty 1

## 2018-04-22 MED ORDER — BIOTIN 5 MG PO TABS: 1.0000 | ORAL_TABLET | Freq: Every morning | ORAL | Status: DC

## 2018-04-22 MED ORDER — ASPIRIN EC 81 MG PO TBEC
81.0000 mg | DELAYED_RELEASE_TABLET | Freq: Every evening | ORAL | Status: DC
  Administered 2018-04-22: 81 mg via ORAL
  Filled 2018-04-22: qty 1

## 2018-04-22 NOTE — Evaluation (Addendum)
Physical Therapy Evaluation Patient Details Name: [name] MRN: [id number] DOB: 05-31-1934 Today's Date: 04/22/2018   History of Present Illness  [name] is a 83 y.o. female with medical history significant of asthma, hypertension comes in for the second time this week for wheezing and shortness of breath.  Patient has had several days of shortness of breath and wheezing she has had outpatient prednisone for day does not feel like she is back to her baseline status.  She comes and found to be wheezing referred for admission for tachypnea.  Oxygen sats are normal vitals are all normal.  Patient is feeling better after several nebs.  No fevers a lot of coughing.  She quit smoking about 2 months ago.    Clinical Impression  Patient functioning near baseline for functional mobility and gait, limited mostly due to SOB during ambulation having to take 3 stand rest breaks by leaning on wall due to SOB, wheezing, other than that able to safely walk in room and hallways without loss of balance.  Patient on room air throughout treatment with O2 saturation between 24-26% while ambulating and tolerated sitting up in chair after therapy.  Patient will benefit from continued physical therapy in hospital and recommended venue below to increase strength, balance, endurance for safe ADLs and gait.     Follow Up Recommendations Home health PT    Equipment Recommendations  None recommended by PT    Recommendations for Other Services       Precautions / Restrictions Precautions Precautions: Fall Restrictions Weight Bearing Restrictions: No      Mobility  Bed Mobility Overal bed mobility: Modified Independent             General bed mobility comments: increased time  Transfers Overall transfer level: Modified independent Equipment used: Straight cane             General transfer comment: increased time  Ambulation/Gait Ambulation/Gait assistance: Supervision Gait  Distance (Feet): 75 Feet Assistive device: Straight cane Gait Pattern/deviations: Step-through pattern;Decreased step length - right;Decreased step length - left;Decreased stride length Gait velocity: decreased   General Gait Details: slightly labored slow cadence with frequent standing rest breaks leaning on walls due to SOB, no loss of balance while on room air with O2 saturation between 94-96%  Stairs            Wheelchair Mobility    Modified Rankin (Stroke Patients Only)       Balance Overall balance assessment: Needs assistance Sitting-balance support: Feet supported;No upper extremity supported Sitting balance-Leahy Scale: Good     Standing balance support: Single extremity supported;During functional activity Standing balance-Leahy Scale: Fair Standing balance comment: using SPC                             Pertinent Vitals/Pain Pain Assessment: No/denies pain    Home Living Family/patient expects to be discharged to:: Private residence Living Arrangements: Children Available Help at Discharge: Family;Available PRN/intermittently Type of Home: House Home Access: Ramped entrance     Home Layout: One level Home Equipment: Walker - 2 wheels;Cane - single point      Prior Function Level of Independence: Independent with assistive device(s)         Comments: household and short distanced community ambulator with [facility]     Hand Dominance   Dominant Hand: Right    Extremity/Trunk Assessment   Upper Extremity Assessment Upper Extremity Assessment: Overall [facility]  for tasks assessed    Lower Extremity Assessment Lower Extremity Assessment: Generalized weakness    Cervical / Trunk Assessment Cervical / Trunk Assessment: Normal  Communication   Communication: No difficulties  Cognition Arousal/Alertness: Awake/alert Behavior During Therapy: WFL for tasks assessed/performed Overall Cognitive Status: Within Functional Limits for tasks  assessed                                        General Comments      Exercises     Assessment/Plan    PT Assessment Patient needs continued PT services  PT Problem List Decreased strength;Decreased activity tolerance;Decreased balance;Decreased mobility       PT Treatment Interventions Gait training;Stair training;Functional mobility training;Therapeutic activities;Patient/family education;Therapeutic exercise    PT Goals (Current goals can be found in the Care Plan section)  Acute Rehab PT Goals Patient Stated Goal: return home with family to assist PT Goal Formulation: With patient Time For Goal Achievement: 04/26/18 Potential to Achieve Goals: Good    Frequency Min 3X/week   Barriers to discharge        Co-evaluation               AM-PAC PT "6 Clicks" Mobility  Outcome Measure Help needed turning from your back to your side while in a flat bed without using bedrails?: None Help needed moving from lying on your back to sitting on the side of a flat bed without using bedrails?: None Help needed moving to and from a bed to a chair (including a wheelchair)?: None Help needed standing up from a chair using your arms (e.g., wheelchair or bedside chair)?: None Help needed to walk in hospital room?: A Little Help needed climbing 3-5 steps with a railing? : A Little 6 Click Score: 22    End of Session   Activity Tolerance: Patient tolerated treatment well;Patient limited by fatigue Patient left: in chair;with call bell/phone within reach Nurse Communication: Mobility status PT Visit Diagnosis: Unsteadiness on feet (R26.81);Other abnormalities of gait and mobility (R26.89);Muscle weakness (generalized) (M62.81)    Time: 4627-0350 PT Time Calculation (min) (ACUTE ONLY): 20 min   Charges:   PT Evaluation $PT Eval Moderate Complexity: 1 Mod PT Treatments $Therapeutic Activity: 8-22 mins        3:05 PM, 04/22/18 Ocie Bob, MPT Physical  Therapist with [facility] 336 [phone] office [phone] mobile phone

## 2018-04-22 NOTE — Progress Notes (Signed)
While ambulating with PT patient's oxygen saturation was between 94-96%. Patient tolerated activity . Will continue to monitor patient.

## 2018-04-22 NOTE — Plan of Care (Signed)
  Problem: Acute Rehab PT Goals(only PT should resolve) Goal: Patient Will Transfer Sit To/From Stand Outcome: Progressing Flowsheets (Taken 04/22/2018 1507) Patient will transfer sit to/from stand: Independently Goal: Pt Will Transfer Bed To Chair/Chair To Bed Outcome: Progressing Flowsheets (Taken 04/22/2018 1507) Pt will Transfer Bed to Chair/Chair to Bed: Independently Goal: Pt Will Ambulate Outcome: Progressing Flowsheets (Taken 04/22/2018 1507) Pt will Ambulate: 100 feet; with modified independence; with cane   3:08 PM, 04/22/18 Ocie Bob, MPT Physical Therapist with [facility] [phone] office [phone] mobile phone

## 2018-04-22 NOTE — Progress Notes (Signed)
Per HPI: [name] is a 83 y.o. female with medical history significant of asthma, hypertension comes in for the second time this week for wheezing and shortness of breath.  Patient has had several days of shortness of breath and wheezing she has had outpatient prednisone for day does not feel like she is back to her baseline status.  She comes and found to be wheezing referred for admission for tachypnea.  Oxygen sats are normal vitals are all normal.  Patient is feeling better after several nebs.  No fevers a lot of coughing.  She quit smoking about 2 months ago.  Patient has a noted prior history of COPD, but only has rescue inhaler nebulizer machine at home.  She has been admitted for acute COPD exacerbation without any significant component of hypoxemia.  She continues to remain somewhat symptomatic and therefore I have increased her steroid dose to IV methylprednisolone today.  We will continue to monitor for symptomatic improvement and maintain on breathing treatments.  Anticipate discharge in next 24 hours with home oral prednisone as well as Spiriva.  Will need pulmonology follow-up in the outpatient setting.

## 2018-04-22 NOTE — H&P (Signed)
History and Physical    [name] GYJ:[id number] DOB: 07/17/1934 DOA: 04/21/2018  PCP: [name], MD  Patient coming from: Home  Chief Complaint: Wheezing and shortness of breath  HPI: [name] is a 83 y.o. female with medical history significant of asthma, hypertension comes in for the second time this week for wheezing and shortness of breath.  Patient has had several days of shortness of breath and wheezing she has had outpatient prednisone for day does not feel like she is back to her baseline status.  She comes and found to be wheezing referred for admission for tachypnea.  Oxygen sats are normal vitals are all normal.  Patient is feeling better after several nebs.  No fevers a lot of coughing.  She quit smoking about 2 months ago.  Review of Systems: As per HPI otherwise 10 point review of systems negative.   Past Medical History:  Diagnosis Date  . Allergic rhinitis   . Anxiety   . Arthritis   . Asthma   . CAP (community acquired pneumonia) 11/26/2017  . Depression   . Dyspnea    with exertion   . Elevated troponin 04/05/2016  . Essential hypertension, benign   . GERD (gastroesophageal reflux disease)   . Headache    occiptal neuralgia - left   . History of blood transfusion    "related to miscarriage"  . Left bundle branch block   . Memory loss   . Mixed hyperlipidemia   . Occipital neuralgia of left side   . Osteopenia   . 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 Urinary urgency     Past Surgical History:  Procedure Laterality Date  . APPENDECTOMY    . BACK SURGERY    . CARDIAC CATHETERIZATION  2010   Normal coronaries  . CATARACT EXTRACTION W/PHACO  04/16/2011   Procedure: CATARACT EXTRACTION PHACO AND INTRAOCULAR LENS PLACEMENT (IOC);  Surgeon: Susa Simmonds;  Location: [facility];  Service: Ophthalmology;  Laterality: Left;  CDE=11.35  . CATARACT EXTRACTION W/PHACO  01/28/2012   Procedure: CATARACT EXTRACTION  PHACO AND INTRAOCULAR LENS PLACEMENT (IOC);  Surgeon: Susa Simmonds, MD;  Location: [facility];  Service: Ophthalmology;  Laterality: Right;  CDI:8.15  . CYSTO WITH HYDRODISTENSION  05/29/2012   Procedure: CYSTOSCOPY/HYDRODISTENSION;  Surgeon: Martina Sinner, MD;  [facility];  Service: Urology;  Laterality: N/A;  INSTILLATION OF MARCAINE AND PYRIDIUM   . DILATION AND CURETTAGE OF UTERUS  <hysterectomy"  . HAMMER TOE SURGERY Bilateral 11/22/11   [facility], [name]  . JOINT REPLACEMENT    . LAPAROSCOPIC CHOLECYSTECTOMY  1991  . LUMBAR LAMINECTOMY  2011  . LUMBAR LAMINECTOMY/DECOMPRESSION MICRODISCECTOMY Left 06/06/2012   Procedure: LUMBAR LAMINECTOMY/DECOMPRESSION MICRODISCECTOMY 1 LEVEL;  Surgeon: Maeola Harman, MD;  Location: [facility];  Service: Neurosurgery;  Laterality: Left;  Left Lumbar two-three Laminectomy for resection of synovial cyst  . SHOULDER ARTHROSCOPY Right 02/12/2018   SHOULDER ARTHROSCOPY, BICEPS DEBRIDEMENT, REMOVAL OF LOOSE BODIES  . SHOULDER ARTHROSCOPY Right 02/12/2018   Procedure: RIGHT SHOULDER ARTHROSCOPY, BICEPS DEBRIDEMENT, REMOVAL OF LOOSE BODIES;  Surgeon: Eldred Manges, MD;  Location: [facility];  Service: Orthopedics;  Laterality: Right;  . SHOULDER OPEN ROTATOR CUFF REPAIR Right 2011  . TONSILLECTOMY    . TOTAL HIP ARTHROPLASTY Right 03/13/2013   Procedure: TOTAL HIP ARTHROPLASTY;  Surgeon: Nestor Lewandowsky, MD;  Location: [facility];  Service: Orthopedics;  Laterality: Right;  Marland Kitchen VAGINAL HYSTERECTOMY  reports that she has been smoking cigarettes. She has smoked for the past 60.00 years. She has never used smokeless tobacco. She reports previous alcohol use. She reports that she does not use drugs.  Allergies  Allergen Reactions  . Lisinopril-Hydrochlorothiazide Swelling, Other (See Comments) and Cough    Lip swelling and cough. Stopped by ENT. Patient is on Losartan without any issues   . Codeine Itching and Swelling    eye irritation  (prescribed percocet)  . Penicillins Itching and Swelling    PATIENT HAS HAD A PCN REACTION WITH IMMEDIATE RASH, FACIAL/TONGUE/THROAT SWELLING, SOB, OR LIGHTHEADEDNESS WITH HYPOTENSION:  #  #  YES  #  #  Has patient had a PCN reaction causing severe rash involving mucus membranes or skin necrosis: No Has patient had a PCN reaction that required hospitalization No Has patient had a PCN reaction occurring within the last 10 years: No eye irritation  . Aricept [Donepezil Hcl] Nausea And Vomiting  . Namenda [Memantine Hcl] Nausea And Vomiting    Family History  Problem Relation Age of Onset  . Diabetes Father   . Diabetes Brother   . Cancer Brother [id number]       colon  . Diabetes Brother   . Diabetes Brother   . Hypertension Sister   . Diabetes Sister   . Diabetes Sister   . Diabetes Other   . Allergies Other   . Diabetes Son   . Colon cancer Neg Hx   . Anesthesia problems Neg Hx   . Hypotension Neg Hx   . Malignant hyperthermia Neg Hx   . Pseudochol deficiency Neg Hx     Prior to Admission medications   Medication Sig Start Date End Date Taking? Authorizing Provider  albuterol (PROVENTIL HFA;VENTOLIN HFA) 108 (90 Base) MCG/ACT inhaler Inhale 1-2 puffs into the lungs every 6 (six) hours as needed for wheezing or shortness of breath.   Yes [provider]  aspirin EC 81 MG tablet Take 81 mg by mouth every evening.    Yes [provider]  azelastine (ASTELIN) 0.1 % nasal spray Place 2 sprays into both nostrils 2 (two) times daily. Use in each nostril as directed Patient taking differently: Place 2 sprays into both nostrils 2 (two) times daily as needed for allergies. Use in each nostril as directed 11/28/17  Yes Rhetta MuraSamtani, Jai-Gurmukh, MD  benzonatate (TESSALON PERLES) 100 MG capsule Take 1 capsule (100 mg total) by mouth 3 (three) times daily as needed for cough. 04/14/18  Yes Martin, Mary-Margaret, FNP  Biotin 5 MG TABS Take 1 tablet by mouth every morning.    Yes  [provider]  buPROPion (WELLBUTRIN SR) 150 MG 12 hr tablet TAKE (1) TABLET TWICE A DAY. Patient taking differently: Take 150 mg by mouth 2 (two) times daily.  03/20/18  Yes Johna SheriffVincent, Carol L, MD  carvedilol (COREG) 6.25 MG tablet TAKE  (1)  TABLET TWICE A DAY WITH MEALS (BREAKFAST AND SUPPER) Patient taking differently: Take 6.25 mg by mouth 2 (two) times daily with a meal.  03/20/18  Yes Johna SheriffVincent, Carol L, MD  escitalopram (LEXAPRO) 10 MG tablet Take 10 mg by mouth every morning.   Yes [provider]  esomeprazole (NEXIUM) 40 MG capsule Take 1 capsule (40 mg total) by mouth daily before breakfast. 03/20/18  Yes Johna SheriffVincent, Carol L, MD  fluticasone furoate-vilanterol (BREO ELLIPTA) 200-25 MCG/INH AEPB Inhale 1 puff into the lungs daily.   Yes [provider]  furosemide (LASIX) 20 MG tablet  Take 1 tablet (20 mg total) by mouth daily. Pt to take 40 mg for 2 days then decrease to 20 mg daily Patient taking differently: Take 20 mg by mouth every morning.  07/04/17 04/21/18 Yes Rollene Rotunda, MD  gabapentin (NEURONTIN) 100 MG capsule Take 300 mg by mouth 2 (two) times daily.   Yes [provider]  loratadine (CLARITIN) 10 MG tablet Take 1 tablet (10 mg total) by mouth daily. Patient taking differently: Take 10 mg by mouth every morning.  08/28/17  Yes [name], MD  losartan (COZAAR) 50 MG tablet Take 1/2 tablet (25 mg total) by mouth daily. Patient taking differently: Take 25 mg by mouth every morning.  08/28/17  Yes [name], MD  Multiple Vitamins-Minerals (CENTRUM SILVER PO) Take 1 tablet by mouth every morning.    Yes [provider]  naproxen sodium (ALEVE) 220 MG tablet Take 220 mg by mouth daily as needed (for pain).   Yes [provider]  potassium chloride SA (K-DUR,KLOR-CON) 20 MEQ tablet Take 1 tablet (20 mEq total) by mouth daily. Patient taking differently: Take 20 mEq by mouth every morning.  07/04/17  Yes Rollene Rotunda, MD    raloxifene (EVISTA) 60 MG tablet TAKE 1 TABLET DAILY Patient taking differently: Take 60 mg by mouth every morning.  03/14/18  Yes [name], MD  SYMBICORT 160-4.5 MCG/ACT inhaler Inhale 2 puffs into the lungs 2 (two) times daily. Patient taking differently: Inhale 2 puffs into the lungs 2 (two) times daily.  03/10/18  Yes [name], MD  vitamin B-12 (CYANOCOBALAMIN) 1000 MCG tablet Take 1,000 mcg by mouth daily.   Yes [provider]  gabapentin (NEURONTIN) 400 MG capsule Take 1 capsule (400 mg total) by mouth 3 (three) times daily. Patient not taking: Reported on 04/21/2018 04/17/18   [name], MD  HYDROcodone-acetaminophen [facility]) 5-325 MG tablet Take 1 tablet by mouth every 6 (six) hours as needed for moderate pain. Patient not taking: Reported on 04/21/2018 02/12/18   Eldred Manges, MD  HYDROcodone-acetaminophen (NORCO/VICODIN) 5-325 MG tablet Take 1 tablet by mouth every 4 (four) hours as needed for moderate pain. Patient not taking: Reported on 04/21/2018 02/13/18   Eldred Manges, MD  pravastatin (PRAVACHOL) 80 MG tablet Take 1 tablet (80 mg total) by mouth daily. 08/07/17   Rollene Rotunda, MD  predniSONE (DELTASONE) 10 MG tablet Take 2 tablets (20 mg total) by mouth 2 (two) times daily with a meal. 04/21/18   Geoffery Lyons, MD  Spacer/Aero-Holding Chambers (AEROCHAMBER MV) inhaler Use as instructed 12/24/17   Coral Ceo, NP    Physical Exam: Vitals:   04/22/18 0000 04/22/18 0030 04/22/18 0045 04/22/18 0100  BP: 126/68 (!) 126/58  (!) 138/57  Pulse: 69 75 71 68  Resp: 13 15 15 12   Temp:      TempSrc:      SpO2: 100% 96% 98% 98%  Weight:      Height:          Constitutional: NAD, calm, comfortable Vitals:   04/22/18 0000 04/22/18 0030 04/22/18 0045 04/22/18 0100  BP: 126/68 (!) 126/58  (!) 138/57  Pulse: 69 75 71 68  Resp: 13 15 15 12   Temp:      TempSrc:      SpO2: 100% 96% 98% 98%  Weight:      Height:       Eyes: PERRL, lids and  conjunctivae normal ENMT: Mucous membranes are moist.  Posterior pharynx clear of any exudate or lesions.Normal dentition.  Neck: normal, supple, no masses, no thyromegaly Respiratory: clear to auscultation bilaterally, expiratory bilateral wheezing, no crackles. Normal respiratory effort. No accessory muscle use.  Cardiovascular: Regular rate and rhythm, no murmurs / rubs / gallops. No extremity edema. 2+ pedal pulses. No carotid bruits.  Abdomen: no tenderness, no masses palpated. No hepatosplenomegaly. Bowel sounds positive.  Musculoskeletal: no clubbing / cyanosis. No joint deformity upper and lower extremities. Good ROM, no contractures. Normal muscle tone.  Skin: no rashes, lesions, ulcers. No induration Neurologic: CN 2-12 grossly intact. Sensation intact, DTR normal. Strength 5/5 in all 4.  Psychiatric: Normal judgment and insight. Alert and oriented x 3. Normal mood.    Labs on Admission: I have personally reviewed following labs and imaging studies  CBC: Recent Labs  Lab 04/21/18 0425 04/22/18 0013  WBC 9.9 12.3*  NEUTROABS 9.0* 11.1*  HGB 13.9 14.1  HCT 42.6 43.9  MCV 93.0 91.6  PLT 267 305   Basic Metabolic Panel: Recent Labs  Lab 04/21/18 0425  NA 140  K 4.1  CL 108  CO2 22  GLUCOSE 129*  BUN 16  CREATININE 1.23*  CALCIUM 8.8*   GFR: Estimated Creatinine Clearance: 33.6 mL/min (A) (by C-G formula based on SCr of 1.23 mg/dL (H)). Liver Function Tests: No results for input(s): AST, ALT, ALKPHOS, BILITOT, PROT, ALBUMIN in the last 168 hours. No results for input(s): LIPASE, AMYLASE in the last 168 hours. No results for input(s): AMMONIA in the last 168 hours. Coagulation Profile: No results for input(s): INR, PROTIME in the last 168 hours. Cardiac Enzymes: No results for input(s): CKTOTAL, CKMB, CKMBINDEX, TROPONINI in the last 168 hours. BNP (last 3 results) Recent Labs    07/03/17 1249 09/07/17 1157  PROBNP 1,415* 1,147*   HbA1C: No results for  input(s): HGBA1C in the last 72 hours. CBG: No results for input(s): GLUCAP in the last 168 hours. Lipid Profile: No results for input(s): CHOL, HDL, LDLCALC, TRIG, CHOLHDL, LDLDIRECT in the last 72 hours. Thyroid Function Tests: No results for input(s): TSH, T4TOTAL, FREET4, T3FREE, THYROIDAB in the last 72 hours. Anemia Panel: No results for input(s): VITAMINB12, FOLATE, FERRITIN, TIBC, IRON, RETICCTPCT in the last 72 hours. Urine analysis:    Component Value Date/Time   COLORURINE YELLOW 04/05/2016 1654   APPEARANCEUR HAZY (A) 04/05/2016 1654   APPEARANCEUR Clear 10/28/2015 1622   LABSPEC 1.039 (H) 04/05/2016 1654   PHURINE 8.0 04/05/2016 1654   GLUCOSEU NEGATIVE 04/05/2016 1654   HGBUR NEGATIVE 04/05/2016 1654   BILIRUBINUR NEGATIVE 04/05/2016 1654   BILIRUBINUR Negative 10/28/2015 1622   KETONESUR NEGATIVE 04/05/2016 1654   PROTEINUR NEGATIVE 04/05/2016 1654   UROBILINOGEN 0.2 10/04/2013 1457   NITRITE NEGATIVE 04/05/2016 1654   LEUKOCYTESUR NEGATIVE 04/05/2016 1654   LEUKOCYTESUR Trace (A) 10/28/2015 1622   Sepsis Labs: !!!!!!!!!!!!!!!!!!!!!!!!!!!!!!!!!!!!!!!!!!!! @LABRCNTIP (procalcitonin:4,lacticidven:4) )No results found for this or any previous visit (from the past 240 hour(s)).   Radiological Exams on Admission: Dg Chest 2 View  Result Date: 04/22/2018 CLINICAL DATA:  Shortness of breath and cough EXAM: CHEST - 2 VIEW COMPARISON:  04/21/2018 FINDINGS: The heart size and mediastinal contours are within normal limits. Both lungs are clear. The visualized skeletal structures are unremarkable. IMPRESSION: No active cardiopulmonary disease. Electronically Signed   By: Alcide Clever M.D.   On: 04/22/2018 00:07   Dg Chest 2 View  Result Date: 04/21/2018 CLINICAL DATA:  Acute onset of shortness of breath. EXAM: CHEST - 2 VIEW  COMPARISON:  Chest radiograph performed 11/28/2017 FINDINGS: The lungs are well-aerated and clear. There is no evidence of focal opacification, pleural  effusion or pneumothorax. The heart is borderline normal in size. No acute osseous abnormalities are seen. Clips are noted within the right upper quadrant, reflecting prior cholecystectomy. Lumbar spinal fusion hardware is partially imaged. Degenerative change is noted at the glenohumeral joints bilaterally. IMPRESSION: No acute cardiopulmonary process seen. Electronically Signed   By: Roanna Raider M.D.   On: 04/21/2018 02:10    Old chart reviewed Chest x-ray reviewed no edema or infiltrate Discussed with EDP midlevel  Assessment/Plan 83 year old female with asthma exacerbation Principal Problem:   GOLD COPD 0 with Asthmatic Component -continue frequent nebs continue prednisone  Active Problems:   SOB (shortness of breath)-stable secondary to the above treat above   Essential hypertension-clarify home meds and resume   Chronic cough-noted   GAD (generalized anxiety disorder)-noted   Mild dementia (HCC)-mild seems clear at this time   Chronic diastolic heart failure (HCC)-stable and compensated   Carotid artery disease (HCC)-stable   Home med list is pending pharmacy review   DVT prophylaxis: Ambulate Code Status: Full Family Communication: None Disposition Plan: 1 to 3 days Consults called: None Admission status: Admission   Leonela Kivi A MD [facility]  If 7PM-7AM, please contact night-coverage [email or web address] Password [email or web address]  04/22/2018, 1:29 AM

## 2018-04-22 NOTE — ED Notes (Signed)
Date and time results received: 04/22/18  (use smartphrase 01:44  Test:Troponin Critical Value: 0.04  Name of Provider Notified: Chauncy Passy  Orders Received? Or Actions Taken?: Dr. Lynelle Doctor notified

## 2018-04-22 NOTE — Care Management Note (Signed)
Case Management Note  Patient Details  Name: [name] MRN: [id number] Date of Birth: June 21, 1934  Subjective/Objective:      amitted with COPD. Pt from home, has insurance and PCP, has neb machine and inhalers. Requiring no oxygen. Pt uses cane with ambulation. HH PT recommended. Pt would like to have HH PT and has chosen [facility] from provider options. Aware HH has 48 hrs to make first visit.               Action/Plan: DC home tomorrow with [facility] PT. Referral given to [facility] rep.   Expected Discharge Date:    04/23/18              Expected Discharge Plan:  Home w Home Health Services  In-House Referral:     Discharge planning Services  CM Consult  Post Acute Care Choice:  Durable Medical Equipment Choice offered to:  NA  DME Arranged:    DME Agency:     HH Arranged:  PT HH Agency:  [facility]  Status of Service:  Completed, signed off  If discussed at Long Length of Stay Meetings, dates discussed:    Additional Comments:  Malcolm Metro, RN 04/22/2018, 10:50 AM

## 2018-04-23 DIAGNOSIS — J449 Chronic obstructive pulmonary disease, unspecified: Secondary | ICD-10-CM | POA: Diagnosis not present

## 2018-04-23 MED ORDER — BENZONATATE 100 MG PO CAPS: 100.0000 mg | ORAL_CAPSULE | Freq: Three times a day (TID) | ORAL | 0 refills | Status: AC | PRN

## 2018-04-23 MED ORDER — FLUTICASONE FUROATE-VILANTEROL 200-25 MCG/INH IN AEPB: 1.0000 | INHALATION_SPRAY | Freq: Every day | RESPIRATORY_TRACT | 0 refills | Status: DC

## 2018-04-23 MED ORDER — PREDNISONE 20 MG PO TABS: 40.0000 mg | ORAL_TABLET | Freq: Every day | ORAL | Status: DC

## 2018-04-23 MED ORDER — ALBUTEROL SULFATE HFA 108 (90 BASE) MCG/ACT IN AERS: 2.0000 | INHALATION_SPRAY | RESPIRATORY_TRACT | 2 refills | Status: DC | PRN

## 2018-04-23 MED ORDER — GUAIFENESIN ER 600 MG PO TB12: 600.0000 mg | ORAL_TABLET | Freq: Two times a day (BID) | ORAL | 0 refills | Status: AC

## 2018-04-23 MED ORDER — PREDNISONE 20 MG PO TABS: 40.0000 mg | ORAL_TABLET | Freq: Every morning | ORAL | 0 refills | Status: AC

## 2018-04-23 MED ORDER — ALBUTEROL SULFATE HFA 108 (90 BASE) MCG/ACT IN AERS
1.0000 | INHALATION_SPRAY | RESPIRATORY_TRACT | Status: DC | PRN
  Filled 2018-04-23: qty 6.7

## 2018-04-23 NOTE — Clinical Social Work Note (Signed)
LCSW notified by MD that pt is stable for dc but that pt stated she didn't have a ride home. Met with pt at bedside this AM. Two nieces were in the room visiting with pt. One of pt's nieces stated that she could bring pt home. Pt agreeable to this. Updated MD and pt's RN. Pt will need meds from our pharmacy prior to dc due to the holiday and Dr. Manuella Ghazi has already discussed this with Pharmacy, [facility]. RN updated and she will connect with [facility] Landry to make sure pt has her meds before dc. No other CSW needs identified.

## 2018-04-23 NOTE — Progress Notes (Signed)
Patient discharged home today per MD orders. Patient vital signs WDL. IV removed and site WDL. Discharge Instructions including follow up appointments, medications, and education reviewed with patient. Patient verbalizes understanding. Patient is transported out via wheelchair.  

## 2018-04-23 NOTE — Discharge Summary (Signed)
Physician Discharge Summary  [name] YJE:[id number] DOB: 1935-03-05 DOA: 04/21/2018  PCP: [name], MD  Admit date: 04/21/2018  Discharge date: 04/23/2018  Admitted From:Home  Disposition:  Home  Recommendations for Outpatient Follow-up:  1. Follow up with PCP in 1-2 weeks 2. Continue on prednisone as prescribed for 5 days 3. Continue on Breo 4. Follow-up with Dr. Juanetta Gosling outpatient in 4 weeks by calling and making appointment  Home Health:Yes with PT  Equipment/Devices:None  Discharge Condition:Stable  CODE STATUS: Full  Diet recommendation: Heart Healthy  Brief/Interim Summary: Per HPI: [name] a 83 y.o.femalewith medical history significant ofasthma, hypertension comes in for the second time this week for wheezing and shortness of breath. Patient has had several days of shortness of breath and wheezing she has had outpatient prednisone for day does not feel like she is back to her baseline status. She comes and found to be wheezing referred for admission for tachypnea. Oxygen sats are normal vitals are all normal. Patient is feeling better after several nebs. No fevers a lot of coughing. She quit smoking about 2 months ago.  Patient has a noted prior history of COPD, but only has rescue inhaler and nebulizer machine at home.  She has been admitted for acute COPD exacerbation without any significant component of hypoxemia.  She had not seen pulmonology previously regarding this diagnosis and does not appear to be on any LAMA or LABA and continues to smoke on a regular basis.  She has done well with a course of IV steroids and breathing treatments and has ambulated with no need for oxygen.  She will need home health PT which will be arranged for her on discharge.  Hospital pharmacy will arrange for her to receive medications prior to discharge as she will discharge on Christmas Day and will not have access to pharmacies.  Additionally, she has  been recommended to follow-up with pulmonologist Dr. Juanetta Gosling in the next 2 to 4 weeks for further evaluation and treatment of her COPD in the outpatient setting.  She has no other acute symptomatology or acute events during the course of this brief admission.   Discharge Diagnoses:  Principal Problem:   GOLD COPD 0 with Asthmatic Component  Active Problems:   Essential hypertension   Chronic cough   GAD (generalized anxiety disorder)   Mild dementia (HCC)   Chronic diastolic heart failure (HCC)   Carotid artery disease (HCC)   SOB (shortness of breath)  Principal discharge diagnosis: Acute COPD exacerbation.  Discharge Instructions  Discharge Instructions    Diet - low sodium heart healthy   Complete by:  As directed    Increase activity slowly   Complete by:  As directed      Allergies as of 04/23/2018      Reactions   Lisinopril-hydrochlorothiazide Swelling, Other (See Comments), Cough   Lip swelling and cough. Stopped by ENT. Patient is on Losartan without any issues    Codeine Itching, Swelling   eye irritation (prescribed percocet)   Penicillins Itching, Swelling   PATIENT HAS HAD A PCN REACTION WITH IMMEDIATE RASH, FACIAL/TONGUE/THROAT SWELLING, SOB, OR LIGHTHEADEDNESS WITH HYPOTENSION:  #  #  YES  #  #  Has patient had a PCN reaction causing severe rash involving mucus membranes or skin necrosis: No Has patient had a PCN reaction that required hospitalization No Has patient had a PCN reaction occurring within the last 10 years: No eye irritation   Aricept [donepezil Hcl] Nausea And Vomiting  Namenda [memantine Hcl] Nausea And Vomiting      Medication List    STOP taking these medications   SYMBICORT 160-4.5 MCG/ACT inhaler Generic drug:  budesonide-formoterol     TAKE these medications   AEROCHAMBER MV inhaler Use as instructed   albuterol 108 (90 Base) MCG/ACT inhaler Commonly known as:  PROVENTIL HFA;VENTOLIN HFA Inhale 1-2 puffs into the lungs every 6  (six) hours as needed for wheezing or shortness of breath. What changed:  Another medication with the same name was added. Make sure you understand how and when to take each.   albuterol 108 (90 Base) MCG/ACT inhaler Commonly known as:  PROVENTIL HFA;VENTOLIN HFA Inhale 2 puffs into the lungs every 4 (four) hours as needed for wheezing or shortness of breath. What changed:  You were already taking a medication with the same name, and this prescription was added. Make sure you understand how and when to take each.   aspirin EC 81 MG tablet Take 81 mg by mouth every evening.   azelastine 0.1 % nasal spray Commonly known as:  ASTELIN Place 2 sprays into both nostrils 2 (two) times daily. Use in each nostril as directed What changed:    when to take this  reasons to take this   benzonatate 100 MG capsule Commonly known as:  TESSALON PERLES Take 1 capsule (100 mg total) by mouth 3 (three) times daily as needed for up to 5 days for cough.   Biotin 5 MG Tabs Take 1 tablet by mouth every morning.   buPROPion 150 MG 12 hr tablet Commonly known as:  WELLBUTRIN SR TAKE (1) TABLET TWICE A DAY. What changed:    how much to take  how to take this  when to take this  additional instructions   carvedilol 6.25 MG tablet Commonly known as:  COREG TAKE  (1)  TABLET TWICE A DAY WITH MEALS (BREAKFAST AND SUPPER) What changed:    how much to take  how to take this  when to take this  additional instructions   CENTRUM SILVER PO Take 1 tablet by mouth every morning.   escitalopram 10 MG tablet Commonly known as:  LEXAPRO Take 10 mg by mouth every morning.   esomeprazole 40 MG capsule Commonly known as:  NEXIUM Take 1 capsule (40 mg total) by mouth daily before breakfast.   fluticasone furoate-vilanterol 200-25 MCG/INH Aepb Commonly known as:  BREO ELLIPTA Inhale 1 puff into the lungs daily. What changed:  when to take this   furosemide 20 MG tablet Commonly known as:   LASIX Take 1 tablet (20 mg total) by mouth daily. Pt to take 40 mg for 2 days then decrease to 20 mg daily What changed:    when to take this  additional instructions   gabapentin 100 MG capsule Commonly known as:  NEURONTIN Take 300 mg by mouth 2 (two) times daily.   gabapentin 400 MG capsule Commonly known as:  NEURONTIN Take 1 capsule (400 mg total) by mouth 3 (three) times daily.   guaiFENesin 600 MG 12 hr tablet Commonly known as:  MUCINEX Take 1 tablet (600 mg total) by mouth 2 (two) times daily for 10 days.   HYDROcodone-acetaminophen 5-325 MG tablet Commonly known as:  NORCO Take 1 tablet by mouth every 6 (six) hours as needed for moderate pain.   HYDROcodone-acetaminophen 5-325 MG tablet Commonly known as:  NORCO/VICODIN Take 1 tablet by mouth every 4 (four) hours as needed for moderate pain.  loratadine 10 MG tablet Commonly known as:  CLARITIN Take 1 tablet (10 mg total) by mouth daily. What changed:  when to take this   losartan 50 MG tablet Commonly known as:  COZAAR Take 1/2 tablet (25 mg total) by mouth daily. What changed:    how much to take  how to take this  when to take this  additional instructions   naproxen sodium 220 MG tablet Commonly known as:  ALEVE Take 220 mg by mouth daily as needed (for pain).   potassium chloride SA 20 MEQ tablet Commonly known as:  K-DUR,KLOR-CON Take 1 tablet (20 mEq total) by mouth daily. What changed:  when to take this   pravastatin 80 MG tablet Commonly known as:  PRAVACHOL Take 1 tablet (80 mg total) by mouth daily.   predniSONE 20 MG tablet Commonly known as:  DELTASONE Take 2 tablets (40 mg total) by mouth every morning for 5 days. What changed:    medication strength  how much to take  when to take this   raloxifene 60 MG tablet Commonly known as:  EVISTA TAKE 1 TABLET DAILY What changed:  when to take this   vitamin B-12 1000 MCG tablet Commonly known as:  CYANOCOBALAMIN Take  1,000 mcg by mouth daily.      Follow-up Information    [name], MD Follow up in 1 week(s).   Specialty:  Pediatrics Contact information: [address] [place] [address] [phone]          Allergies  Allergen Reactions  . Lisinopril-Hydrochlorothiazide Swelling, Other (See Comments) and Cough    Lip swelling and cough. Stopped by ENT. Patient is on Losartan without any issues   . Codeine Itching and Swelling    eye irritation (prescribed percocet)  . Penicillins Itching and Swelling    PATIENT HAS HAD A PCN REACTION WITH IMMEDIATE RASH, FACIAL/TONGUE/THROAT SWELLING, SOB, OR LIGHTHEADEDNESS WITH HYPOTENSION:  #  #  YES  #  #  Has patient had a PCN reaction causing severe rash involving mucus membranes or skin necrosis: No Has patient had a PCN reaction that required hospitalization No Has patient had a PCN reaction occurring within the last 10 years: No eye irritation  . Aricept [Donepezil Hcl] Nausea And Vomiting  . Namenda [Memantine Hcl] Nausea And Vomiting    Consultations:  None   Procedures/Studies: Dg Chest 2 View  Result Date: 04/22/2018 CLINICAL DATA:  Shortness of breath and cough EXAM: CHEST - 2 VIEW COMPARISON:  04/21/2018 FINDINGS: The heart size and mediastinal contours are within normal limits. Both lungs are clear. The visualized skeletal structures are unremarkable. IMPRESSION: No active cardiopulmonary disease. Electronically Signed   By: Alcide Clever M.D.   On: 04/22/2018 00:07   Dg Chest 2 View  Result Date: 04/21/2018 CLINICAL DATA:  Acute onset of shortness of breath. EXAM: CHEST - 2 VIEW COMPARISON:  Chest radiograph performed 11/28/2017 FINDINGS: The lungs are well-aerated and clear. There is no evidence of focal opacification, pleural effusion or pneumothorax. The heart is borderline normal in size. No acute osseous abnormalities are seen. Clips are noted within the right upper quadrant, reflecting prior cholecystectomy. Lumbar  spinal fusion hardware is partially imaged. Degenerative change is noted at the glenohumeral joints bilaterally. IMPRESSION: No acute cardiopulmonary process seen. Electronically Signed   By: Roanna Raider M.D.   On: 04/21/2018 02:10     Discharge Exam: Vitals:   04/23/18 0532 04/23/18 0736  BP: (!) 143/72  Pulse: 81   Resp:    Temp: 98.1 F (36.7 C)   SpO2: 94% 96%   Vitals:   04/22/18 1933 04/22/18 2122 04/23/18 0532 04/23/18 0736  BP:  (!) 156/64 (!) 143/72   Pulse: 77 74 81   Resp: 18     Temp:  98.2 F (36.8 C) 98.1 F (36.7 C)   TempSrc:  Oral Oral   SpO2: 95% 90% 94% 96%  Weight:      Height:        General: Pt is alert, awake, not in acute distress Cardiovascular: RRR, S1/S2 +, no rubs, no gallops Respiratory: CTA bilaterally, no wheezing, no rhonchi Abdominal: Soft, NT, ND, bowel sounds + Extremities: no edema, no cyanosis    The results of significant diagnostics from this hospitalization (including imaging, microbiology, ancillary and laboratory) are listed below for reference.     Microbiology: No results found for this or any previous visit (from the past 240 hour(s)).   Labs: BNP (last 3 results) Recent Labs    08/01/17 1151 11/26/17 0342 04/21/18 0425  BNP 135.0* 132.6* 187.6*   Basic Metabolic Panel: Recent Labs  Lab 04/21/18 0425 04/22/18 0013 04/22/18 0649  NA 140 142 140  K 4.1 4.2 3.8  CL 108 112* 111  CO2 22 21* 22  GLUCOSE 129* 132* 142*  BUN 16 20 20   CREATININE 1.23* 1.01* 1.16*  CALCIUM 8.8* 9.1 8.8*   Liver Function Tests: No results for input(s): AST, ALT, ALKPHOS, BILITOT, PROT, ALBUMIN in the last 168 hours. No results for input(s): LIPASE, AMYLASE in the last 168 hours. No results for input(s): AMMONIA in the last 168 hours. CBC: Recent Labs  Lab 04/21/18 0425 04/22/18 0013 04/22/18 0649  WBC 9.9 12.3* 11.7*  NEUTROABS 9.0* 11.1*  --   HGB 13.9 14.1 13.6  HCT 42.6 43.9 42.3  MCV 93.0 91.6 91.2  PLT 267  305 286   Cardiac Enzymes: Recent Labs  Lab 04/22/18 0013  TROPONINI 0.04*   BNP: Invalid input(s): POCBNP CBG: No results for input(s): GLUCAP in the last 168 hours. D-Dimer No results for input(s): DDIMER in the last 72 hours. Hgb A1c No results for input(s): HGBA1C in the last 72 hours. Lipid Profile No results for input(s): CHOL, HDL, LDLCALC, TRIG, CHOLHDL, LDLDIRECT in the last 72 hours. Thyroid function studies No results for input(s): TSH, T4TOTAL, T3FREE, THYROIDAB in the last 72 hours.  Invalid input(s): FREET3 Anemia work up No results for input(s): VITAMINB12, FOLATE, FERRITIN, TIBC, IRON, RETICCTPCT in the last 72 hours. Urinalysis    Component Value Date/Time   COLORURINE YELLOW 04/05/2016 1654   APPEARANCEUR HAZY (A) 04/05/2016 1654   APPEARANCEUR Clear 10/28/2015 1622   LABSPEC 1.039 (H) 04/05/2016 1654   PHURINE 8.0 04/05/2016 1654   GLUCOSEU NEGATIVE 04/05/2016 1654   HGBUR NEGATIVE 04/05/2016 1654   BILIRUBINUR NEGATIVE 04/05/2016 1654   BILIRUBINUR Negative 10/28/2015 1622   KETONESUR NEGATIVE 04/05/2016 1654   PROTEINUR NEGATIVE 04/05/2016 1654   UROBILINOGEN 0.2 10/04/2013 1457   NITRITE NEGATIVE 04/05/2016 1654   LEUKOCYTESUR NEGATIVE 04/05/2016 1654   LEUKOCYTESUR Trace (A) 10/28/2015 1622   Sepsis Labs Invalid input(s): PROCALCITONIN,  WBC,  LACTICIDVEN Microbiology No results found for this or any previous visit (from the past 240 hour(s)).   Time coordinating discharge: 35 minutes  SIGNED:   10/30/2015, DO [facility] 04/23/2018, 9:29 AM Pager [phone]  If 7PM-7AM, please contact night-coverage [email or web address] Password [email or web address]

## 2018-04-28 ENCOUNTER — Telehealth: Payer: Self-pay | Admitting: Neurology

## 2018-04-28 NOTE — Telephone Encounter (Signed)
Returned call to daughter on [place].  Her mother was last seen on 02/21/16 and her memory has continued to decline.  She has been scheduled to see Dr. Terrace Arabia on 05/29/2018 at 9am.  She will have her mother here at 8:30am for check-in/MMSE.

## 2018-04-28 NOTE — Telephone Encounter (Signed)
Pt daughter [name] on [place]) called requesting an appt for worsening memory, stating over the past few months her mother has "turned into a different person". Scheduled pt for Dr. Zannie Cove next available 07/29/18, would like to discuss coming in sooner and other symptoms

## 2018-05-02 ENCOUNTER — Telehealth: Payer: Self-pay | Admitting: Pediatrics

## 2018-05-02 MED ORDER — ONDANSETRON HCL 4 MG PO TABS: 4.0000 mg | ORAL_TABLET | Freq: Three times a day (TID) | ORAL | 0 refills | Status: DC | PRN

## 2018-05-02 NOTE — Telephone Encounter (Signed)
Pt's daughter aware zofran sent over.

## 2018-05-02 NOTE — Telephone Encounter (Signed)
zofran rx sent to pharmacy

## 2018-05-05 ENCOUNTER — Encounter: Payer: Self-pay | Admitting: Pediatrics

## 2018-05-05 ENCOUNTER — Ambulatory Visit (HOSPITAL_COMMUNITY)
Admission: RE | Admit: 2018-05-05 | Discharge: 2018-05-05 | Disposition: A | Discharge status: Home / Self Care | Payer: Medicare Other | Source: Ambulatory Visit | Attending: Pediatrics | Admitting: Pediatrics

## 2018-05-05 ENCOUNTER — Other Ambulatory Visit: Payer: Self-pay | Admitting: Pediatrics

## 2018-05-05 ENCOUNTER — Other Ambulatory Visit: Payer: Self-pay | Admitting: *Deleted

## 2018-05-05 ENCOUNTER — Telehealth: Payer: Self-pay | Admitting: *Deleted

## 2018-05-05 ENCOUNTER — Ambulatory Visit (INDEPENDENT_AMBULATORY_CARE_PROVIDER_SITE_OTHER): Payer: Medicare Other | Admitting: Pediatrics

## 2018-05-05 ENCOUNTER — Ambulatory Visit (INDEPENDENT_AMBULATORY_CARE_PROVIDER_SITE_OTHER): Payer: Medicare Other

## 2018-05-05 ENCOUNTER — Telehealth: Payer: Self-pay | Admitting: Pediatrics

## 2018-05-05 VITALS — BP 127/74 | HR 66 | Temp 98.6°F | Resp 20 | Ht 62.0 in | Wt 149.4 lb

## 2018-05-05 DIAGNOSIS — I10 Essential (primary) hypertension: Secondary | ICD-10-CM | POA: Diagnosis not present

## 2018-05-05 DIAGNOSIS — J449 Chronic obstructive pulmonary disease, unspecified: Secondary | ICD-10-CM

## 2018-05-05 DIAGNOSIS — M5481 Occipital neuralgia: Secondary | ICD-10-CM | POA: Diagnosis not present

## 2018-05-05 DIAGNOSIS — R0902 Hypoxemia: Secondary | ICD-10-CM

## 2018-05-05 DIAGNOSIS — R0602 Shortness of breath: Secondary | ICD-10-CM

## 2018-05-05 MED ORDER — BUDESONIDE-FORMOTEROL FUMARATE 160-4.5 MCG/ACT IN AERO: 2.0000 | INHALATION_SPRAY | Freq: Two times a day (BID) | RESPIRATORY_TRACT | 3 refills | Status: DC

## 2018-05-05 MED ORDER — IOHEXOL 300 MG/ML  SOLN
75.0000 mL | Freq: Once | INTRAMUSCULAR | Status: AC | PRN
  Administered 2018-05-05: 75 mL via INTRAVENOUS

## 2018-05-05 NOTE — Telephone Encounter (Signed)
Called and spoke with patient.  Informed patient that Xray showed some abnormalities and will need a CT scan done stat per Dr. Oswaldo DoneVincent, patient verbalized understanding.  Ordered placed and [name] made aware.

## 2018-05-05 NOTE — Telephone Encounter (Signed)
Spoke with [employer].  Carlon called to inform patient was to head over to hospital to have CT scan done that was ordered due to abnormalities shown on Xray done here at office.  Carlon states that patient seemed confused and unaware that CT scan was to be done.  Patient states she will drive herself.  Called and informed Daughter [name] that patient's xray showed some abnormalities and will need a STAT CT scan done.  Also informed daughter that patient was confused and stated that she was unaware of abnormalities on Xray and needed CT scan between conversation with this nurse at 1:10pm and phone call with Carlon Chumley aroung 2:30pm.  Daughter verbalized understanding and states she will call patient.

## 2018-05-05 NOTE — Addendum Note (Signed)
Addended by: Caryl BisBOWMAN, Kristoffer Bala M on: 05/05/2018 03:37 PM   Modules accepted: Orders

## 2018-05-05 NOTE — Progress Notes (Addendum)
Subjective:   Patient ID: [name], female    DOB: 18-Jul-1934, 84 y.o.   MRN: [id number] CC: Follow-up Multiple medical problems HPI: [name] is a 84 y.o. female   COPD: Recently admitted to the hospital for 2 days for COPD exacerbation.  Oxygen levels were normal per available hospital documentation.  Patient says she was told to stop the Symbicort, continue albuterol.  She follows with [facility].  Smoking is down to 1 cigarette a week.  Overall she is feeling better since when she was in the hospital.  She does feel short of breath sometimes with exertion.  She has not been coughing.  Occipital neuralgia: Has a headache 1-2 times a week, last for about 30 minutes, improves when she lays down, with left side of her head down on the pillow.  She has been taking gabapentin 3 times a day.  Has an upcoming appoint with neurology.  Relevant past medical, surgical, family and social history reviewed. Allergies and medications reviewed and updated. Social History   Tobacco Use  Smoking Status Current Some Day Smoker  . Years: 60.00  . Types: Cigarettes  Smokeless Tobacco Never Used  Tobacco Comment   1- 2 cigs a day-  1 pkg will  last 2 weeks   ROS: Per HPI   Objective:    BP 127/74   Pulse 66   Temp 98.6 F (37 C) (Oral)   Resp 20   Ht 5\' 2"  (1.575 m)   Wt 149 lb 6.4 oz (67.8 kg)   SpO2 98% Comment: 2L O2 walking of 1 minute  BMI 27.33 kg/m   Wt Readings from Last 3 Encounters:  05/05/18 149 lb 6.4 oz (67.8 kg)  04/22/18 151 lb 0.2 oz (68.5 kg)  04/14/18 161 lb (73 kg)    Oxygen sat walking on room air: 86% Oxygen sat at rest on room air: 95% Oxygen sat walking with 2 L of oxygen: 98% Oxygen sat on 2 L oxygen rest: 98%  Gen: NAD, alert, cooperative with exam, NCAT EYES: EOMI, no conjunctival injection, or no icterus ENT:   OP without erythema LYMPH: no cervical LAD CV: NRRR, normal S1/S2, no murmur, distal pulses 2+ b/l Resp: CTABL, no  wheezes, normal WOB Abd: +BS, soft, NTND.  Ext: No edema, warm, soft calves Neuro: Alert and oriented MSK: normal muscle bulk  Assessment & Plan:  [name] was seen today for follow-up multiple medical problems.  Diagnoses and all orders for this visit:  Chronic obstructive pulmonary disease, unspecified COPD type (HCC) Hypoxia Will repeat chest x-ray.  Patient feeling well overall.  Does desaturate with exertion.  Will order oxygen for use with exertion.  Patient has been off of controller medicine for 2 weeks.  Patient to follow-up with pulmonology.  Patient with some confusion regarding controller medicines.  Per hospital documentation, they were unaware she was seeing pulmonology or on controller medicine.  Patient is not sure who but someone told her to stop the Symbicort.  For right now, will restart the Symbicort, continue albuterol as needed.  Continue to encourage cigarette cessation. -     DG Chest 2 View; Future  Essential hypertension Stable, continue current medicines  Occipital neuralgia of left side Stable, following with neurology.    Worried about worsening memory problems.  Has follow-up appointment with neurology in a couple weeks.  Patient's friend called daughter [name] to have her on speaker phone so she could be part of visit.  [name]  says she will help make sure follow-up appointments with pulmonology get scheduled.  Follow up plan: Return in about 6 weeks (around 06/16/2018). Rex Krasarol , MD [facility]

## 2018-05-06 ENCOUNTER — Telehealth: Payer: Self-pay | Admitting: *Deleted

## 2018-05-06 NOTE — Telephone Encounter (Signed)
Received phone call from patient's physical therapist stating that patient is in Afib

## 2018-05-08 ENCOUNTER — Ambulatory Visit (INDEPENDENT_AMBULATORY_CARE_PROVIDER_SITE_OTHER): Payer: Medicare Other | Admitting: Pediatrics

## 2018-05-08 ENCOUNTER — Encounter: Payer: Self-pay | Admitting: Pediatrics

## 2018-05-08 VITALS — BP 123/77 | HR 77 | Temp 98.7°F | Ht 62.0 in | Wt 149.0 lb

## 2018-05-08 DIAGNOSIS — I499 Cardiac arrhythmia, unspecified: Secondary | ICD-10-CM

## 2018-05-08 NOTE — Progress Notes (Signed)
  Subjective:   Patient ID: [name], female    DOB: Sep 18, 1934, 84 y.o.   MRN: [id number] CC: Irregular Heart Beat  HPI: [name] is a 84 y.o. female   Physical therapist called, said patient has irregular heartbeat.  She seen earlier this week for shortness of breath, started on oxygen with exertion and at night due to low oxygen levels in clinic with exertion.  Patient says she is been feeling well overall.  Sometimes will feel an extra skipped to her heart, otherwise no palpitations, no chest pain or chest pressure.  No heart racing.  No swelling in her ankles or feet.  No history of atrial fibrillation.  Relevant past medical, surgical, family and social history reviewed. Allergies and medications reviewed and updated. Social History   Tobacco Use  Smoking Status Current Some Day Smoker  . Years: 60.00  . Types: Cigarettes  Smokeless Tobacco Never Used  Tobacco Comment   1- 2 cigs a day-  1 pkg will  last 2 weeks   ROS: Per HPI   Objective:    BP 123/77   Pulse 77   Temp 98.7 F (37.1 C) (Oral)   Ht 5\' 2"  (1.575 m)   Wt 149 lb (67.6 kg)   BMI 27.25 kg/m   Wt Readings from Last 3 Encounters:  05/08/18 149 lb (67.6 kg)  05/05/18 149 lb 6.4 oz (67.8 kg)  04/22/18 151 lb 0.2 oz (68.5 kg)    Gen: NAD, alert, cooperative with exam, NCAT EYES: EOMI, no conjunctival injection, or no icterus CV: NRRR, normal S1/S2, no murmur, distal pulses 2+ b/l Resp: CTABL, no wheezes, normal WOB Abd: +BS, soft, NTND.  Ext: No edema, warm Neuro: Alert and oriented   EKG: Machine not working entirely, was able to get a rhythm strip though showing normal heart rate, sinus rhythm with P waves before QRS complex.  Occasional premature P wave  Assessment & Plan:  [name] was seen today for irregular heart beat.  Diagnoses and all orders for this visit:  Irregular heart beat Patient in sinus rhythm, likely with PACs causing irregularity.  Return precautions discussed.   Patient on carvedilol.  Updated medicine list. -     EKG 12-Lead   Follow up plan: As scheduled, 4 weeks Rex Kras, MD [place] [facility]

## 2018-05-09 ENCOUNTER — Ambulatory Visit (INDEPENDENT_AMBULATORY_CARE_PROVIDER_SITE_OTHER): Payer: Medicare Other

## 2018-05-09 DIAGNOSIS — Z7982 Long term (current) use of aspirin: Secondary | ICD-10-CM

## 2018-05-09 DIAGNOSIS — Z7951 Long term (current) use of inhaled steroids: Secondary | ICD-10-CM

## 2018-05-09 DIAGNOSIS — I110 Hypertensive heart disease with heart failure: Secondary | ICD-10-CM

## 2018-05-09 DIAGNOSIS — M1611 Unilateral primary osteoarthritis, right hip: Secondary | ICD-10-CM

## 2018-05-09 DIAGNOSIS — J441 Chronic obstructive pulmonary disease with (acute) exacerbation: Secondary | ICD-10-CM | POA: Diagnosis not present

## 2018-05-09 DIAGNOSIS — F329 Major depressive disorder, single episode, unspecified: Secondary | ICD-10-CM

## 2018-05-09 DIAGNOSIS — I5030 Unspecified diastolic (congestive) heart failure: Secondary | ICD-10-CM

## 2018-05-09 DIAGNOSIS — F0390 Unspecified dementia without behavioral disturbance: Secondary | ICD-10-CM

## 2018-05-09 DIAGNOSIS — I2510 Atherosclerotic heart disease of native coronary artery without angina pectoris: Secondary | ICD-10-CM

## 2018-05-09 DIAGNOSIS — M19011 Primary osteoarthritis, right shoulder: Secondary | ICD-10-CM

## 2018-05-09 DIAGNOSIS — I6529 Occlusion and stenosis of unspecified carotid artery: Secondary | ICD-10-CM

## 2018-05-09 DIAGNOSIS — M8580 Other specified disorders of bone density and structure, unspecified site: Secondary | ICD-10-CM

## 2018-05-09 DIAGNOSIS — J309 Allergic rhinitis, unspecified: Secondary | ICD-10-CM

## 2018-05-09 DIAGNOSIS — F411 Generalized anxiety disorder: Secondary | ICD-10-CM

## 2018-05-09 DIAGNOSIS — Z9181 History of falling: Secondary | ICD-10-CM

## 2018-05-22 ENCOUNTER — Ambulatory Visit (INDEPENDENT_AMBULATORY_CARE_PROVIDER_SITE_OTHER): Payer: Medicare Other | Admitting: Orthopaedic Surgery

## 2018-05-22 ENCOUNTER — Encounter (INDEPENDENT_AMBULATORY_CARE_PROVIDER_SITE_OTHER): Payer: Self-pay | Admitting: Orthopaedic Surgery

## 2018-05-22 VITALS — BP 113/63 | HR 73 | Ht 62.0 in | Wt 149.0 lb

## 2018-05-22 DIAGNOSIS — M19011 Primary osteoarthritis, right shoulder: Secondary | ICD-10-CM | POA: Diagnosis not present

## 2018-05-22 NOTE — Progress Notes (Signed)
Office Visit Note  Patient: [name]  Date of Birth: 02/04/1935  MRN: [id number]  Visit Date: 05/22/2018  Requested by: Vincent, Carol L, MD  [address]  [place], [place] [address]  PCP: Vincent, Carol L, MD  Assessment & Plan:  Visit Diagnoses: Right shoulder osteoarthritis  Plan: Discussed options for her for continued problems would be total shoulder arthroplasty we discussed the therapy that would be required. We discussed incision operative approach and postop therapy required to get satisfactory result. She like some time to think about this and she can return in 2 months to discuss it further.  Follow-Up Instructions: Return in about 2 years (around 05/22/2020).  Orders:  No orders of the defined types were placed in this encounter.   No orders of the defined types were placed in this encounter.   Procedures:  No procedures performed  Clinical Data:  No additional findings.  Subjective:      Chief Complaint  Patient presents with  . Right Shoulder - Pain    02/12/18 Right Shoulder Arthroscopy, Biceps Debridement, Removal of Loose Bodies   HPI patient returns post chondroplasty right shoulder removal of loose bodies. She has been to the emergency room for exacerbation of COPD since her surgery. She still has problems with outstretched reaching and overhead activity with grinding and pain particularly if she lifts something with her arm. Down below her in front of her is long as her shoulders not flex her shoulder functions much better. Patient had partial tearing the biceps tendon this was trimmed and released. She still has problems with sleeping at night if she rolls over on her right shoulder.  Review of Systems done unchanged from surgery other than as mentioned above with exacerbation of COPD and ED visit.  Objective:  Vital Signs: BP 113/63  Pulse 73  Ht 5' 2" (1.575 m)  Wt 149 lb (67.6 kg)  BMI 27.25 kg/m  Physical Exam  Constitutional:  Appearance: She is  well-developed.  HENT:  Head: Normocephalic.  Right Ear: External ear normal.  Left Ear: External ear normal.  Eyes:  Pupils: Pupils are equal, round, and reactive to light.  Neck:  Thyroid: No thyromegaly.  Trachea: No tracheal deviation.  Cardiovascular:  Rate and Rhythm: Normal rate.  Pulmonary:  Effort: Pulmonary effort is normal.  Breath sounds: No stridor. No wheezing.  Abdominal:  Palpations: Abdomen is soft.  Skin:  General: Skin is warm and dry.  Neurological:  Mental Status: She is alert and oriented to person, place, and time.  Psychiatric:  Behavior: Behavior normal.   Ortho Exam patient has pain with flexion over 100 degrees pain with abduction past 80 degrees. Passively using her opposite arm she can get R hand up of foot over the top of her head. Limitation of internal rotation with hand to mid axillary line.  Specialty Comments:  No specialty comments available.  Imaging:  No results found.  PMFS History:      Patient Active Problem List   Diagnosis Date Noted  . SOB (shortness of breath) 04/22/2018  . COPD exacerbation (HCC)   . Occipital neuralgia of left side 03/20/2018  . Glenohumeral arthritis, right 02/12/2018  . Tobacco use 12/24/2017  . GOLD COPD 0 with Asthmatic Component    . Biceps tendinosis of right shoulder 08/04/2017  . Pre-operative cardiovascular examination 08/01/2017  . Carotid artery disease (HCC) 07/03/2017  . DOE (dyspnea on exertion) 07/03/2017  . Chronic right shoulder pain 12/27/2016  . Constipation   04/11/2016  . Chronic diastolic heart failure (HCC) 04/07/2016  . Hyperglycemia 04/05/2016  . Syncope 11/15/2015  . Dehydration 10/05/2015  . UTI (lower urinary tract infection) 10/05/2015  . Mild dementia (HCC) 09/13/2015  . Memory problem 05/07/2015  . Neck pain 04/06/2015  . Insomnia 12/10/2014  . BMI 28.0-28.9,adult 12/10/2014  . Osteopenia 05/19/2014  . GAD (generalized anxiety disorder) 01/06/2014  . Depression  01/06/2014  . GERD (gastroesophageal reflux disease) 01/06/2014  . Vitamin D deficiency 01/06/2014  . Overactive bladder 01/06/2014  . Secondary cardiomyopathy (HCC) 10/06/2013  . Left bundle branch block 10/06/2013  . Dizziness 10/04/2013  . Osteoarthritis of right hip 03/14/2013  . Cough 02/01/2011  . Reflux 02/01/2011  . Allergic rhinitis 12/07/2008  . Chronic cough 12/07/2008  . Hepatitis B virus infection 08/16/2008  . Hyperlipidemia 08/16/2008  . Essential hypertension 08/16/2008  . Primary osteoarthritis, right shoulder 08/16/2008  . DISC DEGENERATION 07/24/2007       Past Medical History:  Diagnosis Date  . Allergic rhinitis   . Anxiety   . Arthritis   . Asthma   . CAP (community acquired pneumonia) 11/26/2017  . Depression   . Dyspnea    with exertion   . Elevated troponin 04/05/2016  . Essential hypertension, benign   . GERD (gastroesophageal reflux disease)   . Headache    occiptal neuralgia - left   . History of blood transfusion    "related to miscarriage"  . Left bundle branch block   . Memory loss   . Mixed hyperlipidemia   . Occipital neuralgia of left side   . Osteopenia   . 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 Urinary urgency         Family History  Problem Relation Age of Onset  . Diabetes Father   . Diabetes Brother   . Cancer Brother 84   colon  . Diabetes Brother   . Diabetes Brother   . Hypertension Sister   . Diabetes Sister   . Diabetes Sister   . Diabetes Other   . Allergies Other   . Diabetes Son   . Colon cancer Neg Hx   . Anesthesia problems Neg Hx   . Hypotension Neg Hx   . Malignant hyperthermia Neg Hx   . Pseudochol deficiency Neg Hx         Past Surgical History:  Procedure Laterality Date  . APPENDECTOMY    . BACK SURGERY    . CARDIAC CATHETERIZATION  2010   Normal coronaries  . CATARACT EXTRACTION W/PHACO  04/16/2011   Procedure: CATARACT EXTRACTION PHACO AND INTRAOCULAR LENS  PLACEMENT (IOC); Surgeon: Carroll F Haines; Location: [facility]; Service: Ophthalmology; Laterality: Left; CDE=11.35  . CATARACT EXTRACTION W/PHACO  01/28/2012   Procedure: CATARACT EXTRACTION PHACO AND INTRAOCULAR LENS PLACEMENT (IOC); Surgeon: Carroll F Haines, MD; Location: [facility]; Service: Ophthalmology; Laterality: Right; CDI:8.15  . CYSTO WITH HYDRODISTENSION  05/29/2012   Procedure: CYSTOSCOPY/HYDRODISTENSION; Surgeon: Scott A MacDiarmid, MD; Location: [facility]; Service: Urology; Laterality: N/A; INSTILLATION OF MARCAINE AND PYRIDIUM   . DILATION AND CURETTAGE OF UTERUS  <hysterectomy"  . HAMMER TOE SURGERY Bilateral 11/22/11   [facility], [facility]  . JOINT REPLACEMENT    . LAPAROSCOPIC CHOLECYSTECTOMY  1991  . LUMBAR LAMINECTOMY  2011  . LUMBAR LAMINECTOMY/DECOMPRESSION MICRODISCECTOMY Left 06/06/2012   Procedure: LUMBAR LAMINECTOMY/DECOMPRESSION MICRODISCECTOMY 1 LEVEL; Surgeon: Joseph Stern, MD; Location: [facility]; Service: Neurosurgery; Laterality: Left; Left Lumbar two-three Laminectomy for resection   [facility];  Service: Urology;  Laterality: N/A;  INSTILLATION OF MARCAINE AND PYRIDIUM   . DILATION AND CURETTAGE OF UTERUS  <hysterectomy"  . HAMMER TOE SURGERY Bilateral 11/22/11   [facility], [name]  . JOINT REPLACEMENT    . LAPAROSCOPIC CHOLECYSTECTOMY  1991  . LUMBAR LAMINECTOMY  2011  . LUMBAR LAMINECTOMY/DECOMPRESSION MICRODISCECTOMY Left 06/06/2012   Procedure: LUMBAR LAMINECTOMY/DECOMPRESSION MICRODISCECTOMY 1 LEVEL;  Surgeon: Maeola Harman, MD;  Location: [facility];  Service: Neurosurgery;  Laterality: Left;  Left Lumbar two-three Laminectomy for resection of synovial cyst  . SHOULDER ARTHROSCOPY Right 02/12/2018   SHOULDER ARTHROSCOPY, BICEPS DEBRIDEMENT, REMOVAL OF LOOSE BODIES  . SHOULDER ARTHROSCOPY Right 02/12/2018   Procedure: RIGHT SHOULDER ARTHROSCOPY, BICEPS DEBRIDEMENT, REMOVAL OF LOOSE BODIES;  Surgeon: Eldred Manges, MD;  Location: [facility];  Service: Orthopedics;  Laterality: Right;  . SHOULDER OPEN ROTATOR CUFF REPAIR Right 2011  . TONSILLECTOMY    . TOTAL HIP ARTHROPLASTY Right 03/13/2013   Procedure: TOTAL HIP ARTHROPLASTY;  Surgeon: Nestor Lewandowsky, MD;  Location: [facility];  Service: Orthopedics;  Laterality: Right;  Marland Kitchen VAGINAL HYSTERECTOMY     Social History   Occupational History  . Occupation: Worked in Geneticist, molecular for 30 years    Employer: RETIRED    Comment: Retired  . Occupation: Part time  care giver  Tobacco Use  . Smoking status: Current Some Day Smoker    Years: 60.00    Types: Cigarettes  . Smokeless tobacco: Never Used  . Tobacco comment: 1- 2 cigs a day-  1 pkg will  last 2 weeks  Substance and Sexual Activity  . Alcohol use: Not Currently    Comment: 11/27/2017 "drank some in my 20s"  . Drug use: No  . Sexual activity: Not Currently

## 2018-05-23 DIAGNOSIS — Z029 Encounter for administrative examinations, unspecified: Secondary | ICD-10-CM

## 2018-05-24 DIAGNOSIS — Z029 Encounter for administrative examinations, unspecified: Secondary | ICD-10-CM

## 2018-05-29 ENCOUNTER — Encounter: Payer: Self-pay | Admitting: Neurology

## 2018-05-29 ENCOUNTER — Ambulatory Visit: Payer: Medicare Other | Admitting: Neurology

## 2018-05-29 VITALS — BP 154/70 | HR 60 | Ht 62.0 in | Wt 158.5 lb

## 2018-05-29 DIAGNOSIS — F0390 Unspecified dementia without behavioral disturbance: Secondary | ICD-10-CM | POA: Diagnosis not present

## 2018-05-29 MED ORDER — MEMANTINE HCL 5 MG PO TABS: 5.0000 mg | ORAL_TABLET | Freq: Two times a day (BID) | ORAL | 11 refills | Status: DC

## 2018-05-29 NOTE — Progress Notes (Addendum)
I saw patient with NP Tyjai Matuszak, above with above.

## 2018-05-30 ENCOUNTER — Telehealth (INDEPENDENT_AMBULATORY_CARE_PROVIDER_SITE_OTHER): Payer: Self-pay

## 2018-05-30 NOTE — Telephone Encounter (Signed)
Patient's daughter called several times apparently she is left messages but not actually talked with anybody or when it rings for while she is on up.  I called and talk with her and she states her mother wants to proceed with surgery.  She has shoulder osteoarthritis and has intact rotator cuff and would like to proceed with total shoulder arthroplasty.  She would need preoperative pulmonary clearance.  When she has this clearance she can return and see Zonia KiefJames Owens, PA for evaluation review of her pulmonary data and placement of orders preoperative visit.

## 2018-05-30 NOTE — Telephone Encounter (Signed)
Patient daughter [phone] [name] calling very frustrated because she said she's been calling all week and no one has called her back. She wants to discuss her mom going ahead and having the shoulder replacement surgery. Asking if they need to come in for a visit or if [name] can send her to the doctor to do the surgery

## 2018-05-30 NOTE — Telephone Encounter (Signed)
Please advise. There have not been any messages in chart regarding messages for return call.

## 2018-06-02 ENCOUNTER — Encounter: Payer: Self-pay | Admitting: Neurology

## 2018-06-09 ENCOUNTER — Ambulatory Visit (INDEPENDENT_AMBULATORY_CARE_PROVIDER_SITE_OTHER): Payer: Medicare Other | Admitting: Pulmonary Disease

## 2018-06-09 ENCOUNTER — Encounter: Payer: Self-pay | Admitting: Pulmonary Disease

## 2018-06-09 VITALS — BP 148/84 | HR 60 | Ht 62.0 in | Wt 162.0 lb

## 2018-06-09 DIAGNOSIS — J4540 Moderate persistent asthma, uncomplicated: Secondary | ICD-10-CM | POA: Diagnosis not present

## 2018-06-09 NOTE — Patient Instructions (Addendum)
Glad your breathing is stable with your breathing Continue the Symbicort and albuterol inhaler Please work on smoking cessation

## 2018-06-09 NOTE — Progress Notes (Signed)
[name]    [id number]    [birth year]  Primary Care Physician:Patient, No Pcp Per  Referring Physician: [name], MD [address] [place], [place] [address]  Chief complaint: Follow up for cough, asthma.  HPI: 84 year old with past medical history of asthma, heart failure GERD Seen at her primary care several times this year for asthma exacerbation treated with antibiotics and prednisone.  She had a chest x-ray in March 2019 which did not show acute abnormality.  States that her breathing is doing better since this episode.  Feels she is back to baseline.  Chief complaint is mild dyspnea on exertion.  No symptoms at rest.  No cough, sputum production, wheezing.  Maintained on breo.  There is some confusion about which inhaler she is on as there are notes from primary care stating that she also has Symbicort and trelegy. Has albuterol inhaler which she uses twice a day Diagnosed with asthma in 2013 with a positive methacholine challenge test, spirometry showing mild obstruction She has a 6 pack-year smoking history.  Continues to smoke a couple of cigarettes a day.  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 Exposures: No known exposures, no mold, hot tub, Jacuzzi Smoking history: 6-pack-year smoker.  Continues to smoke 2 to 3 cigarettes a day Travel history: No significant travel  Interim history: Underwent arthroscopic surgery of right shoulder by Dr. [name] without any respiratory issue States that dyspnea on exertion is at baseline, has chronic cough with white mucus production. Currently on Symbicort 160 twice daily.  She is also on Nexium for GERD.  She had a recent chest x-ray in early January suggestive of lingular collapse however follow-up chest x-ray shows some atelectasis with no obstructive evaluation.  Outpatient Encounter Medications as of 06/09/2018  Medication Sig  . albuterol (PROVENTIL HFA;VENTOLIN HFA) 108 (90 Base)  MCG/ACT inhaler Inhale 1-2 puffs into the lungs every 6 (six) hours as needed for wheezing or shortness of breath.  Marland Kitchen aspirin EC 81 MG tablet Take 81 mg by mouth every evening.   Marland Kitchen azelastine (ASTELIN) 0.1 % nasal spray Place 2 sprays into both nostrils 2 (two) times daily. Use in each nostril as directed (Patient taking differently: Place 2 sprays into both nostrils 2 (two) times daily as needed for allergies. Use in each nostril as directed)  . Biotin 5 MG TABS Take 1 tablet by mouth every morning.   . budesonide-formoterol (SYMBICORT) 160-4.5 MCG/ACT inhaler Inhale 2 puffs into the lungs 2 (two) times daily.  Marland Kitchen buPROPion (WELLBUTRIN SR) 150 MG 12 hr tablet TAKE (1) TABLET TWICE A DAY. (Patient taking differently: Take 150 mg by mouth 2 (two) times daily. )  . carvedilol (COREG) 6.25 MG tablet TAKE  (1)  TABLET TWICE A DAY WITH MEALS (BREAKFAST AND SUPPER) (Patient taking differently: Take 6.25 mg by mouth 2 (two) times daily with a meal. )  . esomeprazole (NEXIUM) 40 MG capsule Take 1 capsule (40 mg total) by mouth daily before breakfast.  . gabapentin (NEURONTIN) 400 MG capsule Take 1 capsule (400 mg total) by mouth 3 (three) times daily.  Marland Kitchen loratadine (CLARITIN) 10 MG tablet Take 1 tablet (10 mg total) by mouth daily. (Patient taking differently: Take 10 mg by mouth every morning. )  . losartan (COZAAR) 50 MG tablet Take 1/2 tablet (25 mg total) by mouth daily. (Patient taking differently: Take 25 mg by mouth every morning. )  .  memantine (NAMENDA) 5 MG tablet Take 1 tablet (5 mg total) by mouth 2 (two) times daily.  . Multiple Vitamins-Minerals (CENTRUM SILVER PO) Take 1 tablet by mouth every morning.   . potassium chloride SA (K-DUR,KLOR-CON) 20 MEQ tablet Take 1 tablet (20 mEq total) by mouth daily. (Patient taking differently: Take 20 mEq by mouth every morning. )  . pravastatin (PRAVACHOL) 80 MG tablet Take 1 tablet (80 mg total) by mouth daily.  . raloxifene (EVISTA) 60 MG tablet TAKE 1  TABLET DAILY (Patient taking differently: Take 60 mg by mouth every morning. )  . Spacer/Aero-Holding Chambers (AEROCHAMBER MV) inhaler Use as instructed  . vitamin B-12 (CYANOCOBALAMIN) 1000 MCG tablet Take 1,000 mcg by mouth daily.  . furosemide (LASIX) 20 MG tablet Take 1 tablet (20 mg total) by mouth daily. Pt to take 40 mg for 2 days then decrease to 20 mg daily (Patient taking differently: Take 20 mg by mouth every morning. )  . [DISCONTINUED] albuterol (PROVENTIL HFA;VENTOLIN HFA) 108 (90 Base) MCG/ACT inhaler Inhale 2 puffs into the lungs every 4 (four) hours as needed for wheezing or shortness of breath. (Patient not taking: Reported on 06/09/2018)   No facility-administered encounter medications on file as of 06/09/2018.     Allergies as of 06/09/2018 - Review Complete 06/09/2018  Allergen Reaction Noted  . Lisinopril-hydrochlorothiazide Swelling, Other (See Comments), and Cough 06/20/2011  . Codeine Itching and Swelling 10/08/2007  . Penicillins Itching and Swelling 10/08/2007  . Aricept [donepezil hcl] Nausea And Vomiting 08/22/2016  . Namenda [memantine hcl] Nausea And Vomiting 08/22/2016    Past Medical History:  Diagnosis Date  . Allergic rhinitis   . Anxiety   . Arthritis   . Asthma   . CAP (community acquired pneumonia) 11/26/2017  . Depression   . Dyspnea    with exertion   . Elevated troponin 04/05/2016  . Essential hypertension, benign   . GERD (gastroesophageal reflux disease)   . Headache    occiptal neuralgia - left   . History of blood transfusion    "related to miscarriage"  . Left bundle branch block   . Memory loss   . Mixed hyperlipidemia   . Occipital neuralgia of left side   . Osteopenia   . 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 Urinary urgency     Past Surgical History:  Procedure Laterality Date  . APPENDECTOMY    . BACK SURGERY    . CARDIAC CATHETERIZATION  2010   Normal coronaries  . CATARACT EXTRACTION  W/PHACO  04/16/2011   Procedure: CATARACT EXTRACTION PHACO AND INTRAOCULAR LENS PLACEMENT (IOC);  Surgeon: Susa Simmonds;  Location: [facility];  Service: Ophthalmology;  Laterality: Left;  CDE=11.35  . CATARACT EXTRACTION W/PHACO  01/28/2012   Procedure: CATARACT EXTRACTION PHACO AND INTRAOCULAR LENS PLACEMENT (IOC);  Surgeon: Susa Simmonds, MD;  Location: [facility];  Service: Ophthalmology;  Laterality: Right;  CDI:8.15  . CYSTO WITH HYDRODISTENSION  05/29/2012   Procedure: CYSTOSCOPY/HYDRODISTENSION;  Surgeon: Martina Sinner, MD;  Location: [facility];  Service: Urology;  Laterality: N/A;  INSTILLATION OF MARCAINE AND PYRIDIUM   . DILATION AND CURETTAGE OF UTERUS  <hysterectomy"  . HAMMER TOE SURGERY Bilateral 11/22/11   [facility], [name]  . JOINT REPLACEMENT    . LAPAROSCOPIC CHOLECYSTECTOMY  1991  . LUMBAR LAMINECTOMY  2011  . LUMBAR LAMINECTOMY/DECOMPRESSION MICRODISCECTOMY Left 06/06/2012   Procedure: LUMBAR LAMINECTOMY/DECOMPRESSION MICRODISCECTOMY 1 LEVEL;  Surgeon: Maeola Harman, MD;  Location: [facility];  Service: Neurosurgery;  Laterality: Left;  Left Lumbar two-three Laminectomy for resection of synovial cyst  . SHOULDER ARTHROSCOPY Right 02/12/2018   SHOULDER ARTHROSCOPY, BICEPS DEBRIDEMENT, REMOVAL OF LOOSE BODIES  . SHOULDER ARTHROSCOPY Right 02/12/2018   Procedure: RIGHT SHOULDER ARTHROSCOPY, BICEPS DEBRIDEMENT, REMOVAL OF LOOSE BODIES;  Surgeon: Eldred MangesYates, Mark C, MD;  Location: [facility];  Service: Orthopedics;  Laterality: Right;  . SHOULDER OPEN ROTATOR CUFF REPAIR Right 2011  . TONSILLECTOMY    . TOTAL HIP ARTHROPLASTY Right 03/13/2013   Procedure: TOTAL HIP ARTHROPLASTY;  Surgeon: Nestor LewandowskyFrank J Rowan, MD;  Location: [facility];  Service: Orthopedics;  Laterality: Right;  Marland Kitchen. VAGINAL HYSTERECTOMY      Family History  Problem Relation Age of Onset  . Diabetes Father   . Diabetes Brother   . Cancer Brother [id number]       colon  . Diabetes Brother   . Diabetes Brother   .  Hypertension Sister   . Diabetes Sister   . Diabetes Sister   . Diabetes Other   . Allergies Other   . Diabetes Son   . Colon cancer Neg Hx   . Anesthesia problems Neg Hx   . Hypotension Neg Hx   . Malignant hyperthermia Neg Hx   . Pseudochol deficiency Neg Hx     Social History   Socioeconomic History  . Marital status: Widowed    Spouse name: Not on file  . Number of children: 5  . Years of education: 8th grade  . Highest education level: Not on file  Occupational History  . 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 Occupation: Part time care giver  Social Needs  . Financial resource strain: Somewhat hard  . Food insecurity:    Worry: Sometimes true    Inability: Never true  . Transportation needs:    Medical: No    Non-medical: No  Tobacco Use  . Smoking status: Former Smoker    Years: 60.00    Types: Cigarettes  . Smokeless tobacco: Never Used  Substance and Sexual Activity  . Alcohol use: Not Currently    Comment: 11/27/2017 "drank some in my 20s"  . Drug use: No  . Sexual activity: Not Currently  Lifestyle  . Physical activity:    Days per week: 0 days    Minutes per session: 0 min  . Stress: Only a little  Relationships  . Social connections:    Talks on phone: More than three times a week    Gets together: More than three times a week    Attends religious service: More than 4 times per year    Active member of club or organization: Yes    Attends meetings of clubs or organizations: More than 4 times per year    Relationship status: Widowed  . Intimate partner violence:    Fear of current or ex partner: No    Emotionally abused: No    Physically abused: No    Forced sexual activity: No  Other Topics Concern  . Not on file  Social History Narrative   Ms Zenda AlpersWebster is divorced. Her adult son lives with her and he is disabled due to bilateral AKA due to diabetes.  She helps to provide care  to him. She has three adult daughters that live in the [place] area. She talks with them daily and they come to visit once or  twice a year. She has 7 grandchildren also.     Review of systems: Review of Systems  Constitutional: Negative for fever and chills.  HENT: Negative.   Eyes: Negative for blurred vision.  Respiratory: as per HPI  Cardiovascular: Negative for chest pain and palpitations.  Gastrointestinal: Negative for vomiting, diarrhea, blood per rectum. Genitourinary: Negative for dysuria, urgency, frequency and hematuria.  Musculoskeletal: Negative for myalgias, back pain and joint pain.  Skin: Negative for itching and rash.  Neurological: Negative for dizziness, tremors, focal weakness, seizures and loss of consciousness.  Endo/Heme/Allergies: Negative for environmental allergies.  Psychiatric/Behavioral: Negative for depression, suicidal ideas and hallucinations.  All other systems reviewed and are negative.  Physical Exam: Blood pressure (!) 148/84, pulse 60, height 5\' 2"  (1.575 m), weight 162 lb (73.5 kg), SpO2 97 %. Gen:      No acute distress HEENT:  EOMI, sclera anicteric Neck:     No masses; no thyromegaly Lungs:    Clear to auscultation bilaterally; normal respiratory effort CV:         Regular rate and rhythm; no murmurs Abd:      + bowel sounds; soft, non-tender; no palpable masses, no distension Ext:    No edema; adequate peripheral perfusion Skin:      Warm and dry; no rash Neuro: alert and oriented x 3 Psych: normal mood and affect  Data Reviewed: Imaging CT chest 02/21/15- cardiomegaly, mild vascular congestion.  CT abdomen 04/08/2016- visualized lungs show mild groundglass opacities at the bases.  Chest x-ray 05/05/2018- linear opacity on the lateral view suggesting lingular collapse.  CT chest 05/05/2018 mild subsegmental atelectasis in the right middle lobe, basilar subsegmental atelectasis.  Small fat-containing diaphragmatic hernia in the right  hemidiaphragm.  Stable 4 mm subpleural nodule in the right upper lobe. I have reviewed the images personally.  PFTs: PFTs with methacholine 07/19/11 FVC 2.66 [136%], FEV1 1.75 [116%], F/F 66 Mild obstruction with positive methacholine test FEV1 change of -23% at 0.25 mg/mL.  12/12/2017 FVC 2.57 [149%), FEV1 1.54 (60%), F/F 60, TLC 110%, DLCO 61% Mild obstructive airway disease, moderate diffusion defect.  Echo 07/08/2017-mild LVH, LVEF 50-55%, grade 1 diastolic dysfunction, PA peak pressure 34  Assessment:  Asthma Confirmed with positive methacholine challenge in 2013 Continue Symbicort, albuterol as needed  GERD Continue Nexium  Plan/Recommendations: - Continue Symbicort - Nexium for GERD  Chilton GreathousePraveen Charles Niese MD [facility] 06/09/2018, 9:35 AM  CC: Johna SheriffVincent, Carol L, MD

## 2018-06-12 ENCOUNTER — Telehealth: Payer: Self-pay | Admitting: General Practice

## 2018-06-12 ENCOUNTER — Other Ambulatory Visit: Payer: Self-pay | Admitting: Family Medicine

## 2018-06-12 DIAGNOSIS — R110 Nausea: Secondary | ICD-10-CM

## 2018-06-12 MED ORDER — ONDANSETRON HCL 4 MG PO TABS: 4.0000 mg | ORAL_TABLET | Freq: Three times a day (TID) | ORAL | 0 refills | Status: DC | PRN

## 2018-06-12 NOTE — Telephone Encounter (Signed)
Patient has some loose stools but normal for her. No fevers, no vomiting, no constipation.

## 2018-06-12 NOTE — Telephone Encounter (Signed)
Any other associated symptoms. Fever, abdominal pain, diarrhea, constipation?

## 2018-06-12 NOTE — Telephone Encounter (Signed)
I will send in Zofran. She needs to be seen if symptoms persist or worsen.

## 2018-06-12 NOTE — Telephone Encounter (Signed)
Patient aware.

## 2018-06-12 NOTE — Telephone Encounter (Signed)
What symptoms do you have? nausea  How long have you been sick? Daughter states 24 hours  Have you been seen for this problem? no  If your provider decides to give you a prescription, which pharmacy would you like for it to be sent to? [facility]   Patient informed that this information will be sent to the clinical staff for review and that they should receive a follow up call.

## 2018-06-18 ENCOUNTER — Other Ambulatory Visit: Payer: Self-pay | Admitting: Pediatrics

## 2018-06-19 ENCOUNTER — Other Ambulatory Visit: Payer: Self-pay | Admitting: Pediatrics

## 2018-06-19 DIAGNOSIS — M5481 Occipital neuralgia: Secondary | ICD-10-CM

## 2018-06-20 ENCOUNTER — Ambulatory Visit: Payer: Medicare Other | Admitting: Pediatrics

## 2018-06-23 ENCOUNTER — Ambulatory Visit: Payer: Medicare Other | Admitting: Family Medicine

## 2018-06-26 ENCOUNTER — Other Ambulatory Visit (HOSPITAL_COMMUNITY): Payer: Self-pay

## 2018-06-26 ENCOUNTER — Other Ambulatory Visit: Payer: Self-pay

## 2018-06-26 ENCOUNTER — Telehealth: Payer: Self-pay | Admitting: *Deleted

## 2018-06-26 ENCOUNTER — Encounter (HOSPITAL_COMMUNITY): Payer: Self-pay

## 2018-06-26 ENCOUNTER — Encounter (HOSPITAL_COMMUNITY)
Admission: RE | Admit: 2018-06-26 | Discharge: 2018-06-26 | Disposition: A | Discharge status: Home / Self Care | Payer: Medicare Other | Source: Ambulatory Visit | Attending: Orthopaedic Surgery | Admitting: Orthopaedic Surgery

## 2018-06-26 DIAGNOSIS — Z01818 Encounter for other preprocedural examination: Secondary | ICD-10-CM | POA: Insufficient documentation

## 2018-06-26 HISTORY — DX: Cardiac murmur, unspecified: R01.1

## 2018-06-26 LAB — CBC
HCT: 43.4 % (ref 36.0–46.0)
Hemoglobin: 14.1 g/dL (ref 12.0–15.0)
MCH: 30.4 pg (ref 26.0–34.0)
MCHC: 32.5 g/dL (ref 30.0–36.0)
MCV: 93.5 fL (ref 80.0–100.0)
Platelets: 324 10*3/uL (ref 150–400)
RBC: 4.64 MIL/uL (ref 3.87–5.11)
RDW: 14.6 % (ref 11.5–15.5)
WBC: 8.2 10*3/uL (ref 4.0–10.5)
nRBC: 0 % (ref 0.0–0.2)

## 2018-06-26 LAB — BASIC METABOLIC PANEL
Anion gap: 8 (ref 5–15)
BUN: 16 mg/dL (ref 8–23)
CO2: 28 mmol/L (ref 22–32)
CREATININE: 1.24 mg/dL — AB (ref 0.44–1.00)
Calcium: 9.3 mg/dL (ref 8.9–10.3)
Chloride: 106 mmol/L (ref 98–111)
GFR calc Af Amer: 47 mL/min — ABNORMAL LOW (ref >60)
GFR calc non Af Amer: 41 mL/min — ABNORMAL LOW (ref >60)
Glucose, Bld: 96 mg/dL (ref 70–99)
Potassium: 4.3 mmol/L (ref 3.5–5.1)
Sodium: 142 mmol/L (ref 135–145)

## 2018-06-26 LAB — SURGICAL PCR SCREEN
MRSA, PCR: NEGATIVE
Staphylococcus aureus: POSITIVE — AB

## 2018-06-26 NOTE — Pre-Procedure Instructions (Signed)
[name]  06/26/2018      [facility] - [place] - [address] [place] [address] Phone: [phone] Fax: [phone]    Your procedure is scheduled on Wednesday, March 4th   Report to [facility] Admitting at 10:30 AM             (posted surgery time 12:30p  - 3:27p)   Call this number if you have problems the morning of surgery:  [phone]   Remember:   Do not eat any food or drink any liquids after midnight, Tuesday.              7 days prior to surgery, STOP TAKING any Vitamins, Herbal Supplements, Anti-inflammatories, Blood Thinners.   Take these medicines the morning of surgery with A SIP OF WATER : Coreg, Nexium    Do not wear jewelry, make-up or nail polish.  Do not wear lotions, powders,  perfumes, or deodorant.    Do not bring valuables to the hospital.  [facility] is not responsible for any belongings or valuables.  Contacts, dentures or bridgework may not be worn into surgery.  Leave your suitcase in the car.  After surgery it may be brought to your room.  For patients admitted to the hospital, discharge time will be determined by your treatment team.  Please read over the following fact sheets that you were given. MRSA Information and Surgical Site Infection Prevention      [facility]- Preparing For Surgery  Before surgery, you can play an important role. Because skin is not sterile, your skin needs to be as free of germs as possible. You can reduce the number of germs on your skin by washing with CHG (chlorahexidine gluconate) Soap before surgery.  CHG is an antiseptic cleaner which kills germs and bonds with the skin to continue killing germs even after washing.    Oral Hygiene is also important to reduce your risk of infection.    Remember - BRUSH YOUR TEETH THE MORNING OF SURGERY WITH YOUR REGULAR TOOTHPASTE  Please do not use if you have an allergy to CHG or antibacterial soaps.  If your skin becomes reddened/irritated stop using the CHG.  Do not shave (including legs and underarms) for at least 48 hours prior to first CHG shower. It is OK to shave your face.  Please follow these instructions carefully.   1. Shower the NIGHT BEFORE SURGERY and the MORNING OF SURGERY with CHG.   2. If you chose to wash your hair, wash your hair first as usual with your normal shampoo.  3. After you shampoo, rinse your hair and body thoroughly to remove the shampoo.  4. Use CHG as you would any other liquid soap. You can apply CHG directly to the skin and wash gently with a scrungie or a clean washcloth.   5. Apply the CHG Soap to your body ONLY FROM THE NECK DOWN.  Do not use on open wounds or open sores. Avoid contact with your eyes, ears, mouth and genitals (private parts). Wash Face and genitals (private parts)  with your normal soap.  6. Wash thoroughly, paying special attention to the area where your surgery will be performed.  7. Thoroughly rinse your body with warm water from the neck down.  8. DO NOT shower/wash with your normal soap after using and rinsing off the CHG Soap.  9. Pat yourself dry with a CLEAN TOWEL.  10.  Wear CLEAN PAJAMAS to bed the night before surgery, wear comfortable clothes the morning of surgery  11. Place CLEAN SHEETS on your bed the night of your first shower and DO NOT SLEEP WITH PETS.  Day of Surgery:  Do not apply any deodorants/lotions.  Please wear clean clothes to the hospital/surgery center.   Remember to brush your teeth WITH YOUR REGULAR TOOTHPASTE.

## 2018-06-26 NOTE — Telephone Encounter (Signed)
Called Debbie back, she is aware that pt is coming tomorrow

## 2018-06-26 NOTE — Progress Notes (Addendum)
PCP -  Dr. Oswaldo Done (she is no longer there) and may be seeing Gilford Silvius FNP  Pulm - Dr Ina Kick  LOV 05/2008  Cardiologist - Dr. Bryan Lemma   10/2017  Chest x-ray - 04/2018 EKG - 04/2018    Being repeated 06/26/2018  (pat states she has been in A-fib)  Stress Test - 07/2017 ECHO - 10/2017 Cardiac Cath - 2010  Sleep Study - denies CPAP -denies   Fasting Blood Sugar - n/a Checks Blood Sugar _____ times a day  Blood Thinner Instructions:   Aspirin Instructions: I called PCP to get a last dose date (3818) and will relay info to patient. (Patient will be seeing her PCP 06/27/2018 and they will address the stopping of the aspirin then)  Anesthesia review:   Patient denies shortness of breath, fever, cough and chest pain at PAT appointment  Patient verbalized understanding of instructions that were given to them at the PAT appointment. Patient was also instructed that they will need to review over the PAT instructions again at home before surgery.   I had pharmacy in to review her medications, however, the patient left the list in the car.  Pharmacy tech will attempt to call patients pharmacy to get info, but patient has been instructed by them to bring medications DOS.

## 2018-06-26 NOTE — Telephone Encounter (Signed)
I see her tomorrow and we will discuss this.

## 2018-06-27 ENCOUNTER — Ambulatory Visit: Payer: Medicare Other | Admitting: Family Medicine

## 2018-06-27 ENCOUNTER — Telehealth (INDEPENDENT_AMBULATORY_CARE_PROVIDER_SITE_OTHER): Payer: Self-pay | Admitting: Orthopaedic Surgery

## 2018-06-27 NOTE — Progress Notes (Addendum)
Anesthesia Chart Review:  Case:  [id number] Date/Time:  07/02/18 1215   Procedure:  RIGHT TOTAL SHOULDER ARTHROPLASTY (Right )   Anesthesia type:  General   Pre-op diagnosis:  right shoulder osteoarthritis   [facility]  [facility] ROOM 03 / [facility]   Surgeon:  Eldred Manges, MD      DISCUSSION: 84 yo female current smoker. Pertinent hx includes HTN, GERD, HFpEF, DOE, COPD Gold 0, Asthma (has had several exacerbations over the past year), LBBB.  Pt follows with Dr. Antoine Poche for history of diastolic CHF. She was last seen 08/07/2017 and at that time was cleared for arthroscopic shoulder surgery. He advised her to follow up in 1 year.   Folows with pulmonology for mild COPD and asthma. Last seen 06/09/2018, doing well at that time. No issues with arthroscopic shoulder surgery. Dr. Isaiah Serge cleared pt for surgery on 06/17/2018.  Pt seen by PCP 06/30/18 for exetional dyspnea and exertional palpitations. PCP referred pt back to cardiology. Due to new symptoms pt will need clearance by cardiology prior to surgery. Debbie at Dr. [name]' office notified.   Cardiology was contacted by Dr. [name]' office and provided clearance 06/30/18 per telephone encounter by Berton Bon, NP: "Chart reviewed as part of pre-operative protocol coverage. Patient was contacted 06/30/2018 in reference to pre-operative risk assessment for pending surgery as outlined below.  [name] was last seen on 08/07/2017 by Hochrein.  Since that day, [name] has done well from a cardiac standpoint. She has a hx of diastolic CHF. She had several hospitalizations for COPD/resp issues since last seen in our office but she states that she is doing very well at this time. She is active in daily life doing her own housework, preparing meals and still drives. She has had no chest discomfort with activity. She has mild chronic dyspnea on exertion, like when walking a half mile. This is longstanding and unchanged. She had nuclear stress test in 07/2017  that showed no ischemia but low EF. Echo in 10/2017 showed normal EF with normal wall motion.   Therefore, based on ACC/AHA guidelines, the patient would be at acceptable risk for the planned procedure without further cardiovascular testing."  Anticipate she can proceed as planned barring acute status change.   VS: BP (!) 162/90   Pulse 70   Temp 36.6 C   Resp 20   Ht 5\' 2"  (1.575 m)   Wt 72.8 kg   SpO2 97%   BMI 29.34 kg/m   PROVIDERS: [name], [name], FNP is PCP  Elisha Headland, NP, is Pulmonary provider  Rollene Rotunda, MD is Cardiologist  LABS: Labs reviewed: Acceptable for surgery. (all labs ordered are listed, but only abnormal results are displayed)  Labs Reviewed  SURGICAL PCR SCREEN - Abnormal; Notable for the following components:      Result Value   Staphylococcus aureus POSITIVE (*)    All other components within normal limits  BASIC METABOLIC PANEL - Abnormal; Notable for the following components:   Creatinine, Ser 1.24 (*)    GFR calc non Af Amer 41 (*)    GFR calc Af Amer 47 (*)    All other components within normal limits  CBC     IMAGES: CT Chest 05/05/2018: IMPRESSION: Mild subsegmental atelectasis is noted in the right middle lobe. Mild bilateral posterior basilar subsegmental atelectasis is noted.  Small fat containing diaphragmatic hernia is noted posteriorly in the right hemidiaphragm.  Aortic Atherosclerosis (ICD10-I70.0) and Emphysema (ICD10-J43.9).  EKG: 06/26/2018: Sinus rhythm with PACs, Rate 60, possible LAE, LBBB.   PULM: 12/12/2017-pulmonary function test- ratio 61, mid flow reversibility, DLCO 61 >>>Mild obstructive airways disease, moderate diffusion defect  CV: TTE 11/27/2017: Study Conclusions  - Left ventricle: The cavity size was normal. There was mild focal   basal hypertrophy of the septum. Systolic function was normal.   The estimated ejection fraction was in the range of 55% to 60%.   Wall motion was normal; there  were no regional wall motion   abnormalities. Doppler parameters are consistent with abnormal   left ventricular relaxation (grade 1 diastolic dysfunction). - Aortic valve: There was trivial regurgitation. - Mitral valve: Calcified annulus.  Nuclear Stress 08/20/2017:  The left ventricular ejection fraction is moderately decreased (30-44%).  Nuclear stress EF: 38%.  Defect 1: There is a medium defect of moderate severity present in the basal inferoseptal, mid inferoseptal, apical septal and apical inferior location.  This is an intermediate risk study.   Abnormal intermediate stress nuclear study with inferoseptal thinning vs prior infarct; no ischemia; EF 38 with global hypokinesis (worse in the apical inferoseptal wall); mild LVE; study interpreted as intermediate risk due to reduced LV function.  Dr. Antoine PocheHochrein commented on these results as the test was performed for preop clearance for shoulder surgery: "No high risk findings. She has no symptoms. EF was low normal on echo. She has a high functional capacity. There was no ischemia. Given all of this, according to ACC/AHA guidelines the patient is at acceptable risk for the procedure as listed.  Please send this as surgical clearance."  Past Medical History:  Diagnosis Date  . Allergic rhinitis   . Anxiety   . Arthritis   . Asthma   . CAP (community acquired pneumonia) 11/26/2017  . Depression   . Dyspnea    with exertion   . Elevated troponin 04/05/2016  . Essential hypertension, benign   . GERD (gastroesophageal reflux disease)   . Headache    occiptal neuralgia - left   . Heart murmur   . History of blood transfusion    "related to miscarriage"  . Left bundle branch block    in & out of A fib  . Memory loss   . Mixed hyperlipidemia   . Occipital neuralgia of left side   . Osteopenia   . 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 Urinary urgency     Past Surgical History:  Procedure  Laterality Date  . APPENDECTOMY    . BACK SURGERY    . CARDIAC CATHETERIZATION  2010   Normal coronaries  . CATARACT EXTRACTION W/PHACO  04/16/2011   Procedure: CATARACT EXTRACTION PHACO AND INTRAOCULAR LENS PLACEMENT (IOC);  Surgeon: Susa Simmondsarroll F Haines;  Location: [facility];  Service: Ophthalmology;  Laterality: Left;  CDE=11.35  . CATARACT EXTRACTION W/PHACO  01/28/2012   Procedure: CATARACT EXTRACTION PHACO AND INTRAOCULAR LENS PLACEMENT (IOC);  Surgeon: Susa Simmondsarroll F Haines, MD;  Location: [facility];  Service: Ophthalmology;  Laterality: Right;  CDI:8.15  . CYSTO WITH HYDRODISTENSION  05/29/2012   Procedure: CYSTOSCOPY/HYDRODISTENSION;  Surgeon: Martina SinnerScott A MacDiarmid, MD;  [facility];  Service: Urology;  Laterality: N/A;  INSTILLATION OF MARCAINE AND PYRIDIUM   . DILATION AND CURETTAGE OF UTERUS  <hysterectomy"  . EYE SURGERY    . HAMMER TOE SURGERY Bilateral 11/22/11   [facility], [facility]  . JOINT REPLACEMENT    . LAPAROSCOPIC CHOLECYSTECTOMY  1991  . LUMBAR LAMINECTOMY  2011  . LUMBAR LAMINECTOMY/DECOMPRESSION MICRODISCECTOMY Left 06/06/2012   Procedure: LUMBAR LAMINECTOMY/DECOMPRESSION MICRODISCECTOMY 1 LEVEL;  Surgeon: Maeola Harman, MD;  [facility]: [facility];  Service: Neurosurgery;  Laterality: Left;  Left Lumbar two-three Laminectomy for resection of synovial cyst  . SHOULDER ARTHROSCOPY Right 02/12/2018   SHOULDER ARTHROSCOPY, BICEPS DEBRIDEMENT, REMOVAL OF LOOSE BODIES  . SHOULDER ARTHROSCOPY Right 02/12/2018   Procedure: RIGHT SHOULDER ARTHROSCOPY, BICEPS DEBRIDEMENT, REMOVAL OF LOOSE BODIES;  Surgeon: Eldred Manges, MD;  Location[facility];  Service: Orthopedics;  Laterality: Right;  . SHOULDER OPEN ROTATOR CUFF REPAIR Right 2011  . TONSILLECTOMY    . TOTAL HIP ARTHROPLASTY Right 03/13/2013   Procedure: TOTAL HIP ARTHROPLASTY;  Surgeon: Nestor Lewandowsky, MD;  Location[facility];  Service: Orthopedics;  Laterality: Right;  Marland Kitchen VAGINAL HYSTERECTOMY      MEDICATIONS: . albuterol  (PROVENTIL HFA;VENTOLIN HFA) 108 (90 Base) MCG/ACT inhaler  . aspirin EC 81 MG tablet  . azelastine (ASTELIN) 0.1 % nasal spray  . Biotin 5 MG TABS  . budesonide-formoterol (SYMBICORT) 160-4.5 MCG/ACT inhaler  . buPROPion (WELLBUTRIN SR) 150 MG 12 hr tablet  . carvedilol (COREG) 6.25 MG tablet  . esomeprazole (NEXIUM) 40 MG capsule  . furosemide (LASIX) 20 MG tablet  . gabapentin (NEURONTIN) 400 MG capsule  . loratadine (CLARITIN) 10 MG tablet  . losartan (COZAAR) 50 MG tablet  . memantine (NAMENDA) 5 MG tablet  . Multiple Vitamins-Minerals (CENTRUM SILVER PO)  . ondansetron (ZOFRAN) 4 MG tablet  . potassium chloride SA (K-DUR,KLOR-CON) 20 MEQ tablet  . pravastatin (PRAVACHOL) 80 MG tablet  . raloxifene (EVISTA) 60 MG tablet  . Spacer/Aero-Holding Chambers (AEROCHAMBER MV) inhaler  . vitamin B-12 (CYANOCOBALAMIN) 1000 MCG tablet   No current facility-administered medications for this encounter.     [facility] Short Stay Center/Anesthesiology Phone [phone] 06/30/2018 9:30 AM

## 2018-06-27 NOTE — Telephone Encounter (Signed)
It should be a few weeks it depends on how quick she gets off her pain medication and does with her exercises.  We should know fairly well after about 1 to 2 weeks after surgery.  You call thanks

## 2018-06-27 NOTE — Telephone Encounter (Signed)
Please advise 

## 2018-06-27 NOTE — Telephone Encounter (Signed)
Patient's daughter  Renard Hamper [phone] calling to ask when her mother will be able to return to work.   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  Patient's surgery is scheduled for March 4th @[facility] .  Post op visit in [place] office March 19th.

## 2018-06-27 NOTE — Telephone Encounter (Signed)
I called Tonya and advised.

## 2018-06-30 ENCOUNTER — Ambulatory Visit (INDEPENDENT_AMBULATORY_CARE_PROVIDER_SITE_OTHER): Payer: Medicare Other | Admitting: Family Medicine

## 2018-06-30 ENCOUNTER — Telehealth: Payer: Self-pay | Admitting: Cardiology

## 2018-06-30 ENCOUNTER — Telehealth (INDEPENDENT_AMBULATORY_CARE_PROVIDER_SITE_OTHER): Payer: Self-pay | Admitting: Orthopaedic Surgery

## 2018-06-30 ENCOUNTER — Encounter: Payer: Self-pay | Admitting: Family Medicine

## 2018-06-30 VITALS — BP 146/82 | HR 73 | Temp 97.5°F | Ht 62.0 in | Wt 161.0 lb

## 2018-06-30 DIAGNOSIS — I429 Cardiomyopathy, unspecified: Secondary | ICD-10-CM | POA: Diagnosis not present

## 2018-06-30 DIAGNOSIS — I447 Left bundle-branch block, unspecified: Secondary | ICD-10-CM | POA: Diagnosis not present

## 2018-06-30 DIAGNOSIS — I5032 Chronic diastolic (congestive) heart failure: Secondary | ICD-10-CM | POA: Diagnosis not present

## 2018-06-30 NOTE — Telephone Encounter (Signed)
   Primary Cardiologist: Rollene Rotunda, MD  Chart reviewed as part of pre-operative protocol coverage. Patient was contacted 06/30/2018 in reference to pre-operative risk assessment for pending surgery as outlined below.  [name] was last seen on 08/07/2017 by Hochrein.  Since that day, [name] has done well from a cardiac standpoint. She has a hx of diastolic CHF. She had several hospitalizations for COPD/resp issues since last seen in our office but she states that she is doing very well at this time. She is active in daily life doing her own housework, preparing meals and still drives. She has had no chest discomfort with activity. She has mild chronic dyspnea on exertion, like when walking a half mile. This is longstanding and unchanged. She had nuclear stress test in 07/2017 that showed no ischemia but low EF. Echo in 10/2017 showed normal EF with normal wall motion.   Therefore, based on ACC/AHA guidelines, the patient would be at acceptable risk for the planned procedure without further cardiovascular testing.   I will route this recommendation to the requesting party via Epic fax function and remove from pre-op pool.  Please call with questions.  Berton Bon, NP 06/30/2018, 12:31 PM

## 2018-06-30 NOTE — Telephone Encounter (Signed)
fyi

## 2018-06-30 NOTE — Anesthesia Preprocedure Evaluation (Addendum)
Anesthesia Evaluation  Patient identified by MRN, date of birth, ID band Patient awake    Reviewed: Allergy & Precautions, NPO status , Patient's Chart, lab work & pertinent test results  Airway Mallampati: II  TM Distance: >3 FB Neck ROM: Full    Dental no notable dental hx.    Pulmonary asthma , COPD, Current Smoker,    Pulmonary exam normal breath sounds clear to auscultation       Cardiovascular hypertension, +CHF  Normal cardiovascular exam Rhythm:Regular Rate:Normal  LBBB   Neuro/Psych negative neurological ROS  negative psych ROS   GI/Hepatic negative GI ROS, Neg liver ROS,   Endo/Other  negative endocrine ROS  Renal/GU negative Renal ROS  negative genitourinary   Musculoskeletal negative musculoskeletal ROS (+)   Abdominal   Peds negative pediatric ROS (+)  Hematology negative hematology ROS (+)   Anesthesia Other Findings   Reproductive/Obstetrics negative OB ROS                            Anesthesia Physical Anesthesia Plan  ASA: III  Anesthesia Plan: General   Post-op Pain Management:  Regional for Post-op pain   Induction: Intravenous  PONV Risk Score and Plan: 2 and Ondansetron and Treatment may vary due to age or medical condition  Airway Management Planned: Oral ETT  Additional Equipment:   Intra-op Plan:   Post-operative Plan: Extubation in OR  Informed Consent: I have reviewed the patients History and Physical, chart, labs and discussed the procedure including the risks, benefits and alternatives for the proposed anesthesia with the patient or authorized representative who has indicated his/her understanding and acceptance.     Dental advisory given  Plan Discussed with: CRNA  Anesthesia Plan Comments: (See PAT note by Antionette Poles, PA-C )       Anesthesia Quick Evaluation

## 2018-06-30 NOTE — Patient Instructions (Signed)
Left Bundle Branch Block  Left bundle branch block (LBBB) is a problem with the way that electrical impulses pass through the heart (electrical conduction abnormality). The heart depends on an electrical pulse to beat normally. The electrical signal for a heartbeat starts in the upper chambers of the heart (atria) and then travels to the two lower chambers (left and rightventricles). An LBBB is a partial or complete block of the pathway that carries the signal to the left ventricle. If you have LBBB, the left side of your heart does not beat normally. LBBB may be a warning sign of heart disease. What are the causes? This condition may be caused by:  Heart disease.  Disease of the arteries in the heart (arteriosclerosis).  Stiffening or weakening of heart muscle (cardiomyopathy).  Infection of heart muscle (myocarditis).  High blood pressure (hypertension). In some cases, the cause may not be known. What increases the risk? The following factors may make you more likely to develop this condition:  Being female.  Being 50 years of age or older.  Having heart disease.  Having had a heart attack or heart surgery. What are the signs or symptoms? This condition may not cause any symptoms. If you do have symptoms, they may include:  Feeling dizzy or light-headed.  Fainting. How is this diagnosed? This condition may be diagnosed based on an electrocardiogram (ECG). It is often diagnosed when an ECG is done as part of a routine physical or to help find the cause of fainting spells. You may also have imaging tests to find out more about your condition. These may include:  Chest X-rays.  Echo test (echocardiogram). How is this treated? If you do not have symptoms or any other type of heart disease, you may not need treatment for this condition. However, you may need to see your health care provider more often because LBBB can be a warning sign of future heart problems. You may get  treatment for other heart problems or high blood pressure. If LBBB causes symptoms or other heart problems, you may need to have an electrical device (pacemaker) implanted under the skin of your chest. A pacemaker sends electrical signals to your heart to keep it beating normally. Follow these instructions at home:  Follow instructions from your health care provider about eating or drinking restrictions.  Take over-the-counter and prescription medicines only as told by your health care provider.  Return to your normal activities as told by your health care provider. Ask your health care provider what activities are safe for you.  Follow a heart-healthy diet and maintain a healthy weight. Work with a diet and nutrition specialist (dietitian) to create an eating plan that is best for you.  Get regular exercise as told by your health care provider.  Do not use any products that contain nicotine or tobacco, such as cigarettes and e-cigarettes. If you need help quitting, ask your health care provider.  Keep all follow-up visits as told by your health care provider. This is important. Contact a health care provider if:  You are light-headed or you faint. Get help right away if:  You have chest pain.  You have difficulty breathing. These symptoms may represent a serious problem that is an emergency. Do not wait to see if the symptoms will go away. Get medical help right away. Call your local emergency services (911 in the [place] Do not drive yourself to the hospital. Summary  For the heart to beat normally, an electrical signal must   travel to the lower left chamber of the heart (left ventricle). Left bundle branch block (LBBB) is a partial or complete block of the pathway that carries that signal.  This condition may not cause any symptoms. In some cases, a person may feel dizzy or light-headed or may faint.  Treatment may not be needed for LBBB if you do not have symptoms or any other type  of heart disease.  You may need to see your health care provider more often because LBBB can be a warning sign of future heart problems. This information is not intended to replace advice given to you by your health care provider. Make sure you discuss any questions you have with your health care provider. Document Released: 06/05/2016 Document Revised: 06/05/2016 Document Reviewed: 06/05/2016 Elsevier Interactive Patient Education  2019 [employer]  

## 2018-06-30 NOTE — Progress Notes (Signed)
Subjective:  Patient ID: [name], female    DOB: [birth year], 84 y.o.   MRN: [id number]  Chief Complaint:  Follow up irregular heartbeat   HPI: [name] is a 84 y.o. female presenting on 06/30/2018 for Follow up irregular heartbeat  Pt presents today for follow up for irregular heart beat. Pt states she has been doing ok over the last few weeks. Pt states she does have exertional shortness of breath and intermittent palpitations with the exertional shortness of breath. She denies orthopnea or chest pain. No lower extremity edema. Pt had an EKG on 06/26/2018 that has not changed from previous EKG on 11/26/2017. Pt denies jaw, neck, or arm pain. No fatigue or weakness. Denies nausea or diaphoresis. Pt states she has not followed up with cardiology in a long time.  Relevant past medical, surgical, family, and social history reviewed and updated as indicated.  Allergies and medications reviewed and updated.   Past Medical History:  Diagnosis Date  . Allergic rhinitis   . Anxiety   . Arthritis   . Asthma   . CAP (community acquired pneumonia) 11/26/2017  . Depression   . Dyspnea    with exertion   . Elevated troponin 04/05/2016  . Essential hypertension, benign   . GERD (gastroesophageal reflux disease)   . Headache    occiptal neuralgia - left   . Heart murmur   . History of blood transfusion    "related to miscarriage"  . Left bundle branch block    in & out of A fib  . Memory loss   . Mixed hyperlipidemia   . Occipital neuralgia of left side   . Osteopenia   . 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 Urinary urgency     Past Surgical History:  Procedure Laterality Date  . APPENDECTOMY    . BACK SURGERY    . CARDIAC CATHETERIZATION  2010   Normal coronaries  . CATARACT EXTRACTION W/PHACO  04/16/2011   Procedure: CATARACT EXTRACTION PHACO AND INTRAOCULAR LENS PLACEMENT (IOC);  Surgeon: Susa Simmonds;  Location: [facility];   Service: Ophthalmology;  Laterality: Left;  CDE=11.35  . CATARACT EXTRACTION W/PHACO  01/28/2012   Procedure: CATARACT EXTRACTION PHACO AND INTRAOCULAR LENS PLACEMENT (IOC);  Surgeon: Susa Simmonds, MD;  Location: [facility];  Service: Ophthalmology;  Laterality: Right;  CDI:8.15  . CYSTO WITH HYDRODISTENSION  05/29/2012   Procedure: CYSTOSCOPY/HYDRODISTENSION;  Surgeon: Martina Sinner, MD;  Location: [facility];  Service: Urology;  Laterality: N/A;  INSTILLATION OF MARCAINE AND PYRIDIUM   . DILATION AND CURETTAGE OF UTERUS  <hysterectomy"  . EYE SURGERY    . HAMMER TOE SURGERY Bilateral 11/22/11   [facility], [name]  . JOINT REPLACEMENT    . LAPAROSCOPIC CHOLECYSTECTOMY  1991  . LUMBAR LAMINECTOMY  2011  . LUMBAR LAMINECTOMY/DECOMPRESSION MICRODISCECTOMY Left 06/06/2012   Procedure: LUMBAR LAMINECTOMY/DECOMPRESSION MICRODISCECTOMY 1 LEVEL;  Surgeon: Maeola Harman, MD;  Location: [facility];  Service: Neurosurgery;  Laterality: Left;  Left Lumbar two-three Laminectomy for resection of synovial cyst  . SHOULDER ARTHROSCOPY Right 02/12/2018   SHOULDER ARTHROSCOPY, BICEPS DEBRIDEMENT, REMOVAL OF LOOSE BODIES  . SHOULDER ARTHROSCOPY Right 02/12/2018   Procedure: RIGHT SHOULDER ARTHROSCOPY, BICEPS DEBRIDEMENT, REMOVAL OF LOOSE BODIES;  Surgeon: Eldred Manges, MD;  Location: [facility];  Service: Orthopedics;  Laterality: Right;  . SHOULDER OPEN ROTATOR CUFF REPAIR Right 2011  . TONSILLECTOMY    . TOTAL  HIP ARTHROPLASTY Right 03/13/2013   Procedure: TOTAL HIP ARTHROPLASTY;  Surgeon: Nestor Lewandowsky, MD;  Location: [facility];  Service: Orthopedics;  Laterality: Right;  Marland Kitchen VAGINAL HYSTERECTOMY      Social History   Socioeconomic History  . Marital status: Widowed    Spouse name: Not on file  . Number of children: 5  . Years of education: 8th grade  . Highest education level: Not on file  Occupational History  . Occupation: Worked in Geneticist, molecular for 30 years    Employer: RETIRED     Comment: Retired  . Occupation: Part time care giver  Social Needs  . Financial resource strain: Somewhat hard  . Food insecurity:    Worry: Sometimes true    Inability: Never true  . Transportation needs:    Medical: No    Non-medical: No  Tobacco Use  . Smoking status: Light Tobacco Smoker    Years: 60.00    Types: Cigarettes  . Smokeless tobacco: Never Used  Substance and Sexual Activity  . Alcohol use: Not Currently    Comment: 11/27/2017 "drank some in my 20s"  . Drug use: No  . Sexual activity: Not Currently  Lifestyle  . Physical activity:    Days per week: 0 days    Minutes per session: 0 min  . Stress: Only a little  Relationships  . Social connections:    Talks on phone: More than three times a week    Gets together: More than three times a week    Attends religious service: More than 4 times per year    Active member of club or organization: Yes    Attends meetings of clubs or organizations: More than 4 times per year    Relationship status: Widowed  . Intimate partner violence:    Fear of current or ex partner: No    Emotionally abused: No    Physically abused: No    Forced sexual activity: No  Other Topics Concern  . Not on file  Social History Narrative   Ms Prenatt is divorced. Her adult son lives with her and he is disabled due to bilateral AKA due to diabetes.  She helps to provide care to him. She has three adult daughters that live in the [place] area. She talks with them daily and they come to visit once or twice a year. She has 7 grandchildren also.     Outpatient Encounter Medications as of 06/30/2018  Medication Sig  . albuterol (PROVENTIL HFA;VENTOLIN HFA) 108 (90 Base) MCG/ACT inhaler Inhale 1-2 puffs into the lungs every 6 (six) hours as needed for wheezing or shortness of breath.  Marland Kitchen aspirin EC 81 MG tablet Take 81 mg by mouth every evening.   Marland Kitchen azelastine (ASTELIN) 0.1 % nasal spray Place 2 sprays into both nostrils 2 (two) times daily. Use in each  nostril as directed  . Biotin 5 MG TABS Take 5 mg by mouth every morning.   . budesonide-formoterol (SYMBICORT) 160-4.5 MCG/ACT inhaler Inhale 2 puffs into the lungs 2 (two) times daily. (Patient taking differently: Inhale 2 puffs into the lungs 2 (two) times daily as needed (shortness of breath). )  . buPROPion (WELLBUTRIN SR) 150 MG 12 hr tablet TAKE (1) TABLET TWICE A DAY. (Patient taking differently: Take 150 mg by mouth 2 (two) times daily. )  . carvedilol (COREG) 6.25 MG tablet TAKE  (1)  TABLET TWICE A DAY WITH MEALS (BREAKFAST AND SUPPER) (Patient taking differently: Take 6.25  mg by mouth 2 (two) times daily with a meal. )  . esomeprazole (NEXIUM) 40 MG capsule Take 1 capsule (40 mg total) by mouth daily before breakfast.  . gabapentin (NEURONTIN) 400 MG capsule Take 1 capsule (400 mg total) by mouth 3 (three) times daily.  Marland Kitchen loratadine (CLARITIN) 10 MG tablet Take 1 tablet (10 mg total) by mouth daily. (Patient taking differently: Take 10 mg by mouth every morning. )  . losartan (COZAAR) 50 MG tablet Take 1/2 tablet (25 mg total) by mouth daily. (Patient taking differently: Take 25 mg by mouth every morning. )  . memantine (NAMENDA) 5 MG tablet Take 1 tablet (5 mg total) by mouth 2 (two) times daily.  . Multiple Vitamins-Minerals (CENTRUM SILVER PO) Take 1 tablet by mouth every morning.   . ondansetron (ZOFRAN) 4 MG tablet Take 1 tablet (4 mg total) by mouth every 8 (eight) hours as needed for nausea or vomiting.  . potassium chloride SA (K-DUR,KLOR-CON) 20 MEQ tablet Take 1 tablet (20 mEq total) by mouth daily. (Patient taking differently: Take 20 mEq by mouth every morning. )  . pravastatin (PRAVACHOL) 80 MG tablet Take 1 tablet (80 mg total) by mouth daily. (Patient taking differently: Take 80 mg by mouth at bedtime. )  . raloxifene (EVISTA) 60 MG tablet TAKE 1 TABLET DAILY (Patient taking differently: Take 60 mg by mouth daily. )  . Spacer/Aero-Holding Chambers (AEROCHAMBER MV) inhaler  Use as instructed  . vitamin B-12 (CYANOCOBALAMIN) 1000 MCG tablet Take 1,000 mcg by mouth daily.  . furosemide (LASIX) 20 MG tablet Take 1 tablet (20 mg total) by mouth daily. Pt to take 40 mg for 2 days then decrease to 20 mg daily (Patient taking differently: Take 20 mg by mouth every morning. )   No facility-administered encounter medications on file as of 06/30/2018.     Allergies  Allergen Reactions  . Lisinopril-Hydrochlorothiazide Swelling, Other (See Comments) and Cough    Lip swelling and cough. Stopped by ENT. Patient is on Losartan without any issues   . Codeine Itching and Swelling    eye irritation (prescribed percocet)  . Penicillins Itching and Swelling    PATIENT HAS HAD A PCN REACTION WITH IMMEDIATE RASH, FACIAL/TONGUE/THROAT SWELLING, SOB, OR LIGHTHEADEDNESS WITH HYPOTENSION:  #  #  YES  #  #  Has patient had a PCN reaction causing severe rash involving mucus membranes or skin necrosis: No Has patient had a PCN reaction that required hospitalization No Has patient had a PCN reaction occurring within the last 10 years: No eye irritation  . Aricept [Donepezil Hcl] Nausea And Vomiting  . Namenda [Memantine Hcl] Nausea And Vomiting    Review of Systems  Constitutional: Negative for activity change, chills, diaphoresis, fatigue, fever and unexpected weight change.  Respiratory: Positive for shortness of breath (exertional). Negative for cough, chest tightness and wheezing.   Cardiovascular: Positive for palpitations (exertional). Negative for chest pain and leg swelling.  Gastrointestinal: Negative for abdominal pain, nausea and vomiting.  Neurological: Negative for dizziness, syncope, weakness, light-headedness and headaches.  Psychiatric/Behavioral: Negative for confusion.  All other systems reviewed and are negative.       Objective:  BP (!) 146/82   Pulse 73   Temp (!) 97.5 F (36.4 C) (Oral)   Ht  (1.575 m)   Wt 161 lb (73 kg)   BMI 29.45 kg/m    Wt  Readings from Last 3 Encounters:  06/30/18 161 lb (73 kg)  06/26/18 160 lb  6.4 oz (72.8 kg)  06/09/18 162 lb (73.5 kg)    Physical Exam Vitals signs and nursing note reviewed.  Constitutional:      General: She is not in acute distress.    Appearance: Normal appearance. She is well-developed and well-groomed. She is not ill-appearing or toxic-appearing.  HENT:     Head: Normocephalic and atraumatic.  Eyes:     Conjunctiva/sclera: Conjunctivae normal.     Pupils: Pupils are equal, round, and reactive to light.  Neck:     Musculoskeletal: Neck supple.  Cardiovascular:     Rate and Rhythm: Normal rate and regular rhythm.     Pulses: Normal pulses.     Heart sounds: Normal heart sounds. No murmur. No friction rub. No gallop.   Pulmonary:     Effort: Pulmonary effort is normal. No respiratory distress.     Breath sounds: Normal breath sounds. No wheezing, rhonchi or rales.  Musculoskeletal:     Right lower leg: No edema.     Left lower leg: No edema.  Skin:    General: Skin is warm and dry.     Capillary Refill: Capillary refill takes less than 2 seconds.  Neurological:     General: No focal deficit present.     Mental Status: She is alert and oriented to person, place, and time.  Psychiatric:        Mood and Affect: Mood normal.        Behavior: Behavior normal. Behavior is cooperative.        Thought Content: Thought content normal.        Judgment: Judgment normal.     Results for orders placed or performed during the hospital encounter of 06/26/18  Surgical pcr screen  Result Value Ref Range   MRSA, PCR NEGATIVE NEGATIVE   Staphylococcus aureus POSITIVE (A) NEGATIVE  CBC  Result Value Ref Range   WBC 8.2 4.0 - 10.5 K/uL   RBC 4.64 3.87 - 5.11 MIL/uL   Hemoglobin 14.1 12.0 - 15.0 g/dL   HCT 16.143.4 09.636.0 - 04.546.0 %   MCV 93.5 80.0 - 100.0 fL   MCH 30.4 26.0 - 34.0 pg   MCHC 32.5 30.0 - 36.0 g/dL   RDW 40.914.6 81.111.5 - 91.415.5 %   Platelets 324 150 - 400 K/uL   nRBC 0.0 0.0 -  0.2 %  Basic metabolic panel  Result Value Ref Range   Sodium 142 135 - 145 mmol/L   Potassium 4.3 3.5 - 5.1 mmol/L   Chloride 106 98 - 111 mmol/L   CO2 28 22 - 32 mmol/L   Glucose, Bld 96 70 - 99 mg/dL   BUN 16 8 - 23 mg/dL   Creatinine, Ser 7.821.24 (H) 0.44 - 1.00 mg/dL   Calcium 9.3 8.9 - 95.610.3 mg/dL   GFR calc non Af Amer 41 (L) >60 mL/min   GFR calc Af Amer 47 (L) >60 mL/min   Anion gap 8 5 - 15       Pertinent labs & imaging results that were available during my care of the patient were reviewed by me and considered in my medical decision making.  Assessment & Plan:  [name] was seen today for follow up irregular heartbeat.  Diagnoses and all orders for this visit:  Chronic diastolic heart failure (HCC) -     Ambulatory referral to Cardiology  Left bundle branch block -     Ambulatory referral to Cardiology  Secondary cardiomyopathy [facility](HCC) -  Ambulatory referral to Cardiology  Due to ongoing intermittent shortness of breath and palpitations, will refer back to cardiology. Pt aware to report any new or worsening symptoms. Pt aware of signs and symptoms that require emergent evaluation.    Continue all other maintenance medications.  Follow up plan: Return in about 3 months (around 09/30/2018).  Educational handout given for Left bundle branch block  The above assessment and management plan was discussed with the patient. The patient verbalized understanding of and has agreed to the management plan. Patient is aware to call the clinic if symptoms persist or worsen. Patient is aware when to return to the clinic for a follow-up visit. Patient educated on when it is appropriate to go to the emergency department.   Kari BaarsMichelle [name], FNP-C [facility] [phone]

## 2018-06-30 NOTE — Telephone Encounter (Signed)
New Message      [facility] Pre-operative Risk Assessment    Request for surgical clearance:  1. What type of surgery is being performed? Total right Shoulder replacement  2. When is this surgery scheduled? 07/02/2018   3. What type of clearance is required (medical clearance vs. Pharmacy clearance to hold med vs. Both)? Both   4. Are there any medications that need to be held prior to surgery and how long? Doesn't say   5. Practice name and name of physician performing surgery? [facility] Dr Rodell Perna   6. What is your office phone number [phone]    7.   What is your office fax number [phone]   8.   Anesthesia type (None, local, MAC, general) ? General    [name] 06/30/2018, 11:14 AM  _________________________________________________________________   (provider comments below)

## 2018-06-30 NOTE — Telephone Encounter (Signed)
Cardiac clearance has been received as well.

## 2018-06-30 NOTE — Telephone Encounter (Signed)
Ok thanks , I am OK with her getting cardiac clearance also with her Hx. thanks

## 2018-06-30 NOTE — Telephone Encounter (Signed)
[name] (anesthesia Short Stay) called in regards to patient's follow up visit with PCP today which was related to an irregular heart beat.  Patient has had exertional shortness of breath and intermittent palpitations with exertional shortness of breath.  She had a EKG  On 06-26-18.  Patient has not followed up with her cardiologist in a while. I called Dr. Fayrene Fearing Hochrein's office to let them know she is scheduled for Right Total Shoulder Arthroplasty this Wednesday (Mar 4th). I spoke with Marissa and a STAT message was taken regarding clearance.  At the request of Dr. Annell Greening, a pulmonary clearance was obtained from Dr. Chilton Greathouse dated 06-18-18.

## 2018-07-02 ENCOUNTER — Other Ambulatory Visit: Payer: Self-pay

## 2018-07-02 ENCOUNTER — Inpatient Hospital Stay (HOSPITAL_COMMUNITY): Payer: Medicare Other | Admitting: Certified Registered Nurse Anesthetist

## 2018-07-02 ENCOUNTER — Inpatient Hospital Stay (HOSPITAL_COMMUNITY): Payer: Medicare Other

## 2018-07-02 ENCOUNTER — Inpatient Hospital Stay (HOSPITAL_COMMUNITY)
Admission: RE | Admit: 2018-07-02 | Discharge: 2018-07-03 | DRG: 483 | Disposition: A | Discharge status: Home / Self Care | Payer: Medicare Other | Attending: Orthopaedic Surgery | Admitting: Orthopaedic Surgery

## 2018-07-02 ENCOUNTER — Inpatient Hospital Stay (HOSPITAL_COMMUNITY): Payer: Medicare Other | Admitting: Vascular Surgery

## 2018-07-02 ENCOUNTER — Encounter (HOSPITAL_COMMUNITY)
Admission: RE | Disposition: A | Discharge status: Home / Self Care | Payer: Self-pay | Source: Home / Self Care | Attending: Orthopaedic Surgery

## 2018-07-02 ENCOUNTER — Encounter (HOSPITAL_COMMUNITY): Payer: Self-pay

## 2018-07-02 DIAGNOSIS — M19011 Primary osteoarthritis, right shoulder: Principal | ICD-10-CM | POA: Diagnosis present

## 2018-07-02 DIAGNOSIS — Z96641 Presence of right artificial hip joint: Secondary | ICD-10-CM | POA: Diagnosis present

## 2018-07-02 DIAGNOSIS — M8580 Other specified disorders of bone density and structure, unspecified site: Secondary | ICD-10-CM | POA: Diagnosis present

## 2018-07-02 DIAGNOSIS — I110 Hypertensive heart disease with heart failure: Secondary | ICD-10-CM | POA: Diagnosis present

## 2018-07-02 DIAGNOSIS — F0390 Unspecified dementia without behavioral disturbance: Secondary | ICD-10-CM | POA: Diagnosis present

## 2018-07-02 DIAGNOSIS — Z800 Family history of malignant neoplasm of digestive organs: Secondary | ICD-10-CM

## 2018-07-02 DIAGNOSIS — Z7982 Long term (current) use of aspirin: Secondary | ICD-10-CM

## 2018-07-02 DIAGNOSIS — E782 Mixed hyperlipidemia: Secondary | ICD-10-CM | POA: Diagnosis present

## 2018-07-02 DIAGNOSIS — F17210 Nicotine dependence, cigarettes, uncomplicated: Secondary | ICD-10-CM | POA: Diagnosis present

## 2018-07-02 DIAGNOSIS — F329 Major depressive disorder, single episode, unspecified: Secondary | ICD-10-CM | POA: Diagnosis present

## 2018-07-02 DIAGNOSIS — Z8249 Family history of ischemic heart disease and other diseases of the circulatory system: Secondary | ICD-10-CM | POA: Diagnosis not present

## 2018-07-02 DIAGNOSIS — I5032 Chronic diastolic (congestive) heart failure: Secondary | ICD-10-CM | POA: Diagnosis present

## 2018-07-02 DIAGNOSIS — J449 Chronic obstructive pulmonary disease, unspecified: Secondary | ICD-10-CM | POA: Diagnosis present

## 2018-07-02 DIAGNOSIS — F411 Generalized anxiety disorder: Secondary | ICD-10-CM | POA: Diagnosis present

## 2018-07-02 DIAGNOSIS — Z7951 Long term (current) use of inhaled steroids: Secondary | ICD-10-CM

## 2018-07-02 DIAGNOSIS — Z23 Encounter for immunization: Secondary | ICD-10-CM | POA: Diagnosis present

## 2018-07-02 DIAGNOSIS — M25511 Pain in right shoulder: Secondary | ICD-10-CM | POA: Diagnosis present

## 2018-07-02 DIAGNOSIS — K219 Gastro-esophageal reflux disease without esophagitis: Secondary | ICD-10-CM | POA: Diagnosis present

## 2018-07-02 DIAGNOSIS — Z419 Encounter for procedure for purposes other than remedying health state, unspecified: Secondary | ICD-10-CM

## 2018-07-02 DIAGNOSIS — G4700 Insomnia, unspecified: Secondary | ICD-10-CM | POA: Diagnosis present

## 2018-07-02 DIAGNOSIS — I429 Cardiomyopathy, unspecified: Secondary | ICD-10-CM | POA: Diagnosis present

## 2018-07-02 DIAGNOSIS — Z833 Family history of diabetes mellitus: Secondary | ICD-10-CM | POA: Diagnosis not present

## 2018-07-02 DIAGNOSIS — M5481 Occipital neuralgia: Secondary | ICD-10-CM | POA: Diagnosis present

## 2018-07-02 DIAGNOSIS — M19019 Primary osteoarthritis, unspecified shoulder: Secondary | ICD-10-CM | POA: Diagnosis present

## 2018-07-02 HISTORY — PX: TOTAL SHOULDER ARTHROPLASTY: SHX126

## 2018-07-02 SURGERY — ARTHROPLASTY, SHOULDER, TOTAL
Anesthesia: General | Site: Shoulder | Laterality: Right

## 2018-07-02 MED ORDER — PANTOPRAZOLE SODIUM 40 MG PO TBEC
40.0000 mg | DELAYED_RELEASE_TABLET | Freq: Every day | ORAL | Status: DC
  Administered 2018-07-02 – 2018-07-03 (×2): 40 mg via ORAL
  Filled 2018-07-02 (×2): qty 1

## 2018-07-02 MED ORDER — FENTANYL CITRATE (PF) 100 MCG/2ML IJ SOLN: 25.0000 ug | INTRAMUSCULAR | Status: DC | PRN

## 2018-07-02 MED ORDER — VANCOMYCIN HCL IN DEXTROSE 1-5 GM/200ML-% IV SOLN
1000.0000 mg | INTRAVENOUS | Status: AC
  Administered 2018-07-02: 1000 mg via INTRAVENOUS

## 2018-07-02 MED ORDER — RALOXIFENE HCL 60 MG PO TABS
60.0000 mg | ORAL_TABLET | Freq: Every day | ORAL | Status: DC
  Administered 2018-07-03: 60 mg via ORAL
  Filled 2018-07-02 (×2): qty 1

## 2018-07-02 MED ORDER — FENTANYL CITRATE (PF) 100 MCG/2ML IJ SOLN
100.0000 ug | Freq: Once | INTRAMUSCULAR | Status: AC
  Administered 2018-07-02: 100 ug via INTRAVENOUS

## 2018-07-02 MED ORDER — DOCUSATE SODIUM 100 MG PO CAPS
100.0000 mg | ORAL_CAPSULE | Freq: Two times a day (BID) | ORAL | Status: DC
  Administered 2018-07-02 – 2018-07-03 (×2): 100 mg via ORAL
  Filled 2018-07-02 (×2): qty 1

## 2018-07-02 MED ORDER — PROPOFOL 10 MG/ML IV BOLUS
INTRAVENOUS | Status: AC
  Filled 2018-07-02: qty 20

## 2018-07-02 MED ORDER — SODIUM CHLORIDE 0.9 % IR SOLN
Status: DC | PRN
  Administered 2018-07-02: 1000 mL

## 2018-07-02 MED ORDER — ONDANSETRON HCL 4 MG PO TABS: 4.0000 mg | ORAL_TABLET | Freq: Four times a day (QID) | ORAL | Status: DC | PRN

## 2018-07-02 MED ORDER — SUGAMMADEX SODIUM 200 MG/2ML IV SOLN
INTRAVENOUS | Status: DC | PRN
  Administered 2018-07-02: 200 mg via INTRAVENOUS

## 2018-07-02 MED ORDER — ROCURONIUM BROMIDE 50 MG/5ML IV SOSY
PREFILLED_SYRINGE | INTRAVENOUS | Status: AC
  Filled 2018-07-02: qty 5

## 2018-07-02 MED ORDER — FENTANYL CITRATE (PF) 250 MCG/5ML IJ SOLN
INTRAMUSCULAR | Status: AC
  Filled 2018-07-02: qty 5

## 2018-07-02 MED ORDER — CHLORHEXIDINE GLUCONATE 4 % EX LIQD: 60.0000 mL | Freq: Once | CUTANEOUS | Status: DC

## 2018-07-02 MED ORDER — LORATADINE 10 MG PO TABS
10.0000 mg | ORAL_TABLET | Freq: Every morning | ORAL | Status: DC
  Administered 2018-07-03: 10 mg via ORAL
  Filled 2018-07-02: qty 1

## 2018-07-02 MED ORDER — GABAPENTIN 400 MG PO CAPS
400.0000 mg | ORAL_CAPSULE | Freq: Three times a day (TID) | ORAL | Status: DC
  Administered 2018-07-02 – 2018-07-03 (×2): 400 mg via ORAL
  Filled 2018-07-02 (×2): qty 1

## 2018-07-02 MED ORDER — SODIUM CHLORIDE 0.9 % IV SOLN
INTRAVENOUS | Status: DC
  Administered 2018-07-02: 22:00:00 via INTRAVENOUS

## 2018-07-02 MED ORDER — EPHEDRINE 5 MG/ML INJ
INTRAVENOUS | Status: AC
  Filled 2018-07-02: qty 10

## 2018-07-02 MED ORDER — BUPIVACAINE HCL (PF) 0.5 % IJ SOLN
INTRAMUSCULAR | Status: DC | PRN
  Administered 2018-07-02: 15 mL

## 2018-07-02 MED ORDER — DEXAMETHASONE SODIUM PHOSPHATE 10 MG/ML IJ SOLN
INTRAMUSCULAR | Status: AC
  Filled 2018-07-02: qty 1

## 2018-07-02 MED ORDER — ALBUTEROL SULFATE (2.5 MG/3ML) 0.083% IN NEBU: 2.5000 mg | INHALATION_SOLUTION | Freq: Four times a day (QID) | RESPIRATORY_TRACT | Status: DC | PRN

## 2018-07-02 MED ORDER — LACTATED RINGERS IV SOLN
INTRAVENOUS | Status: DC
  Administered 2018-07-02 (×2): via INTRAVENOUS

## 2018-07-02 MED ORDER — FUROSEMIDE 20 MG PO TABS
20.0000 mg | ORAL_TABLET | Freq: Every morning | ORAL | Status: DC
  Administered 2018-07-03: 20 mg via ORAL
  Filled 2018-07-02: qty 1

## 2018-07-02 MED ORDER — POTASSIUM CHLORIDE CRYS ER 20 MEQ PO TBCR
20.0000 meq | EXTENDED_RELEASE_TABLET | Freq: Every morning | ORAL | Status: DC
  Administered 2018-07-03: 20 meq via ORAL
  Filled 2018-07-02: qty 1

## 2018-07-02 MED ORDER — MENTHOL 3 MG MT LOZG: 1.0000 | LOZENGE | OROMUCOSAL | Status: DC | PRN

## 2018-07-02 MED ORDER — VANCOMYCIN HCL IN DEXTROSE 1-5 GM/200ML-% IV SOLN
1000.0000 mg | INTRAVENOUS | Status: AC
  Administered 2018-07-03: 1000 mg via INTRAVENOUS
  Filled 2018-07-02 (×2): qty 200

## 2018-07-02 MED ORDER — MEPERIDINE HCL 50 MG/ML IJ SOLN: 6.2500 mg | INTRAMUSCULAR | Status: DC | PRN

## 2018-07-02 MED ORDER — LOSARTAN POTASSIUM 25 MG PO TABS
25.0000 mg | ORAL_TABLET | Freq: Every morning | ORAL | Status: DC
  Administered 2018-07-03: 25 mg via ORAL

## 2018-07-02 MED ORDER — ONDANSETRON HCL 4 MG/2ML IJ SOLN
INTRAMUSCULAR | Status: DC | PRN
  Administered 2018-07-02: 4 mg via INTRAVENOUS

## 2018-07-02 MED ORDER — CARVEDILOL 6.25 MG PO TABS
6.2500 mg | ORAL_TABLET | Freq: Two times a day (BID) | ORAL | Status: DC
  Administered 2018-07-03: 6.25 mg via ORAL
  Filled 2018-07-02: qty 1

## 2018-07-02 MED ORDER — MIDAZOLAM HCL 2 MG/2ML IJ SOLN
INTRAMUSCULAR | Status: AC
  Filled 2018-07-02: qty 2

## 2018-07-02 MED ORDER — PHENYLEPHRINE 40 MCG/ML (10ML) SYRINGE FOR IV PUSH (FOR BLOOD PRESSURE SUPPORT)
PREFILLED_SYRINGE | INTRAVENOUS | Status: AC
  Filled 2018-07-02: qty 10

## 2018-07-02 MED ORDER — ONDANSETRON HCL 4 MG/2ML IJ SOLN
4.0000 mg | Freq: Four times a day (QID) | INTRAMUSCULAR | Status: DC | PRN
  Administered 2018-07-02: 4 mg via INTRAVENOUS
  Filled 2018-07-02: qty 2

## 2018-07-02 MED ORDER — SODIUM CHLORIDE 0.9 % IV SOLN
INTRAVENOUS | Status: DC | PRN
  Administered 2018-07-02: 25 ug/min via INTRAVENOUS

## 2018-07-02 MED ORDER — EPHEDRINE SULFATE-NACL 50-0.9 MG/10ML-% IV SOSY
PREFILLED_SYRINGE | INTRAVENOUS | Status: DC | PRN
  Administered 2018-07-02 (×2): 5 mg via INTRAVENOUS

## 2018-07-02 MED ORDER — PNEUMOCOCCAL VAC POLYVALENT 25 MCG/0.5ML IJ INJ
0.5000 mL | INJECTION | INTRAMUSCULAR | Status: AC
  Administered 2018-07-03: 0.5 mL via INTRAMUSCULAR
  Filled 2018-07-02: qty 0.5

## 2018-07-02 MED ORDER — FENTANYL CITRATE (PF) 100 MCG/2ML IJ SOLN
INTRAMUSCULAR | Status: AC
  Administered 2018-07-02: 100 ug via INTRAVENOUS
  Filled 2018-07-02: qty 2

## 2018-07-02 MED ORDER — BUPIVACAINE LIPOSOME 1.3 % IJ SUSP
INTRAMUSCULAR | Status: DC | PRN
  Administered 2018-07-02: 10 mL via PERINEURAL

## 2018-07-02 MED ORDER — MEMANTINE HCL 10 MG PO TABS
5.0000 mg | ORAL_TABLET | Freq: Two times a day (BID) | ORAL | Status: DC
  Administered 2018-07-03: 5 mg via ORAL
  Filled 2018-07-02: qty 1

## 2018-07-02 MED ORDER — FENTANYL CITRATE (PF) 250 MCG/5ML IJ SOLN
INTRAMUSCULAR | Status: DC | PRN
  Administered 2018-07-02 (×3): 50 ug via INTRAVENOUS

## 2018-07-02 MED ORDER — LIDOCAINE 2% (20 MG/ML) 5 ML SYRINGE
INTRAMUSCULAR | Status: DC | PRN
  Administered 2018-07-02: 40 mg via INTRAVENOUS

## 2018-07-02 MED ORDER — ROCURONIUM BROMIDE 10 MG/ML (PF) SYRINGE
PREFILLED_SYRINGE | INTRAVENOUS | Status: DC | PRN
  Administered 2018-07-02: 50 mg via INTRAVENOUS

## 2018-07-02 MED ORDER — MIDAZOLAM HCL 2 MG/2ML IJ SOLN
INTRAMUSCULAR | Status: DC | PRN
  Administered 2018-07-02: 1 mg via INTRAVENOUS

## 2018-07-02 MED ORDER — ONDANSETRON HCL 4 MG/2ML IJ SOLN
INTRAMUSCULAR | Status: AC
  Filled 2018-07-02: qty 2

## 2018-07-02 MED ORDER — PROPOFOL 10 MG/ML IV BOLUS
INTRAVENOUS | Status: DC | PRN
  Administered 2018-07-02: 20 mg via INTRAVENOUS
  Administered 2018-07-02: 150 mg via INTRAVENOUS
  Administered 2018-07-02: 30 mg via INTRAVENOUS

## 2018-07-02 MED ORDER — ONDANSETRON HCL 4 MG/2ML IJ SOLN
INTRAMUSCULAR | Status: AC
  Filled 2018-07-02: qty 4

## 2018-07-02 MED ORDER — VITAMIN B-12 1000 MCG PO TABS
1000.0000 ug | ORAL_TABLET | Freq: Every day | ORAL | Status: DC
  Administered 2018-07-03: 1000 ug via ORAL
  Filled 2018-07-02: qty 1

## 2018-07-02 MED ORDER — METOCLOPRAMIDE HCL 5 MG/ML IJ SOLN: 5.0000 mg | Freq: Three times a day (TID) | INTRAMUSCULAR | Status: DC | PRN

## 2018-07-02 MED ORDER — BUPROPION HCL ER (SR) 150 MG PO TB12
150.0000 mg | ORAL_TABLET | Freq: Two times a day (BID) | ORAL | Status: DC
  Administered 2018-07-02 – 2018-07-03 (×2): 150 mg via ORAL
  Filled 2018-07-02 (×2): qty 1

## 2018-07-02 MED ORDER — PHENOL 1.4 % MT LIQD: 1.0000 | OROMUCOSAL | Status: DC | PRN

## 2018-07-02 MED ORDER — LIDOCAINE 2% (20 MG/ML) 5 ML SYRINGE
INTRAMUSCULAR | Status: AC
  Filled 2018-07-02: qty 15

## 2018-07-02 MED ORDER — MOMETASONE FURO-FORMOTEROL FUM 200-5 MCG/ACT IN AERO
2.0000 | INHALATION_SPRAY | Freq: Two times a day (BID) | RESPIRATORY_TRACT | Status: DC | PRN
  Filled 2018-07-02: qty 8.8

## 2018-07-02 MED ORDER — ACETAMINOPHEN 325 MG PO TABS: 325.0000 mg | ORAL_TABLET | Freq: Four times a day (QID) | ORAL | Status: DC | PRN

## 2018-07-02 MED ORDER — HYDROCODONE-ACETAMINOPHEN 5-325 MG PO TABS
1.0000 | ORAL_TABLET | ORAL | Status: DC | PRN
  Administered 2018-07-03 (×2): 1 via ORAL
  Filled 2018-07-02 (×2): qty 1

## 2018-07-02 MED ORDER — METOCLOPRAMIDE HCL 5 MG PO TABS: 5.0000 mg | ORAL_TABLET | Freq: Three times a day (TID) | ORAL | Status: DC | PRN

## 2018-07-02 MED ORDER — PRAVASTATIN SODIUM 40 MG PO TABS
80.0000 mg | ORAL_TABLET | Freq: Every day | ORAL | Status: DC
  Administered 2018-07-02: 80 mg via ORAL
  Filled 2018-07-02: qty 2

## 2018-07-02 MED ORDER — METOCLOPRAMIDE HCL 5 MG/ML IJ SOLN
INTRAMUSCULAR | Status: AC
  Filled 2018-07-02: qty 2

## 2018-07-02 MED ORDER — METOCLOPRAMIDE HCL 5 MG/ML IJ SOLN
10.0000 mg | Freq: Once | INTRAMUSCULAR | Status: AC | PRN
  Administered 2018-07-02: 10 mg via INTRAVENOUS

## 2018-07-02 SURGICAL SUPPLY — 64 items
BLADE SAW SGTL 18X1.27X75 (BLADE) ×2 IMPLANT
BLADE SAW SGTL 18X1.27X75MM (BLADE) ×1
BODY ANATOMIC PROXIMAL SZ10 (Shoulder) ×3 IMPLANT
CEMENT BONE DEPUY (Cement) ×3 IMPLANT
CHLORAPREP W/TINT 26ML (MISCELLANEOUS) ×3 IMPLANT
CLOSURE WOUND 1/2 X4 (GAUZE/BANDAGES/DRESSINGS) ×1
COVER SURGICAL LIGHT HANDLE (MISCELLANEOUS) ×3 IMPLANT
COVER WAND RF STERILE (DRAPES) IMPLANT
DRAPE IMP U-DRAPE 54X76 (DRAPES) ×3 IMPLANT
DRAPE INCISE IOBAN 66X45 STRL (DRAPES) ×3 IMPLANT
DRAPE U-SHAPE 47X51 STRL (DRAPES) ×3 IMPLANT
DRSG ADAPTIC 3X8 NADH LF (GAUZE/BANDAGES/DRESSINGS) ×3 IMPLANT
DRSG MEPILEX BORDER 4X12 (GAUZE/BANDAGES/DRESSINGS) ×3 IMPLANT
DRSG PAD ABDOMINAL 8X10 ST (GAUZE/BANDAGES/DRESSINGS) ×6 IMPLANT
DURAPREP 26ML APPLICATOR (WOUND CARE) IMPLANT
ELECT REM PT RETURN 9FT ADLT (ELECTROSURGICAL) ×3
ELECTRODE REM PT RTRN 9FT ADLT (ELECTROSURGICAL) ×1 IMPLANT
EPIPHYSIS BODY POROCOAT SZ10 (Orthopedic Implant) ×3 IMPLANT
EVACUATOR 1/8 PVC DRAIN (DRAIN) IMPLANT
GAUZE SPONGE 4X4 12PLY STRL (GAUZE/BANDAGES/DRESSINGS) ×3 IMPLANT
GLENOID ANCHOR PEG CROSSLK 48 (Orthopedic Implant) ×3 IMPLANT
GLOVE BIOGEL PI IND STRL 8 (GLOVE) ×2 IMPLANT
GLOVE BIOGEL PI INDICATOR 8 (GLOVE) ×4
GLOVE ORTHO TXT STRL SZ7.5 (GLOVE) ×6 IMPLANT
GOWN STRL REUS W/ TWL LRG LVL3 (GOWN DISPOSABLE) ×1 IMPLANT
GOWN STRL REUS W/ TWL XL LVL3 (GOWN DISPOSABLE) ×1 IMPLANT
GOWN STRL REUS W/TWL 2XL LVL3 (GOWN DISPOSABLE) ×3 IMPLANT
GOWN STRL REUS W/TWL LRG LVL3 (GOWN DISPOSABLE) ×2
GOWN STRL REUS W/TWL XL LVL3 (GOWN DISPOSABLE) ×2
KIT BASIN OR (CUSTOM PROCEDURE TRAY) ×3 IMPLANT
KIT TURNOVER KIT B (KITS) ×3 IMPLANT
MANIFOLD NEPTUNE II (INSTRUMENTS) ×3 IMPLANT
NDL SUT 6 .5 CRC .975X.05 MAYO (NEEDLE) ×1 IMPLANT
NEEDLE HYPO 25GX1X1/2 BEV (NEEDLE) ×3 IMPLANT
NEEDLE MAYO TAPER (NEEDLE) ×2
NS IRRIG 1000ML POUR BTL (IV SOLUTION) ×3 IMPLANT
PACK SHOULDER (CUSTOM PROCEDURE TRAY) ×3 IMPLANT
PACK UNIVERSAL I (CUSTOM PROCEDURE TRAY) ×3 IMPLANT
PAD ABD 8X10 STRL (GAUZE/BANDAGES/DRESSINGS) ×3 IMPLANT
PAD ARMBOARD 7.5X6 YLW CONV (MISCELLANEOUS) ×6 IMPLANT
PASSER SUT SWANSON 36MM LOOP (INSTRUMENTS) IMPLANT
PIN METAGLENE 2.5 (PIN) ×3 IMPLANT
SLING ARM IMMOBILIZER LRG (SOFTGOODS) ×3 IMPLANT
SMARTMIX MINI TOWER (MISCELLANEOUS)
SPONGE LAP 18X18 RF (DISPOSABLE) ×3 IMPLANT
STAPLER VISISTAT 35W (STAPLE) ×3 IMPLANT
STEM STANDARD SZ 10 113MM (Stem) ×3 IMPLANT
STEM STD SZ 10 113MM (Stem) ×1 IMPLANT
STRIP CLOSURE SKIN 1/2X4 (GAUZE/BANDAGES/DRESSINGS) ×2 IMPLANT
SUCTION FRAZIER HANDLE 10FR (MISCELLANEOUS) ×2
SUCTION TUBE FRAZIER 10FR DISP (MISCELLANEOUS) ×1 IMPLANT
SUT FIBERWIRE #2 38 T-5 BLUE (SUTURE) ×15
SUT VIC AB 0 CT1 27 (SUTURE) ×4
SUT VIC AB 0 CT1 27XBRD ANBCTR (SUTURE) ×2 IMPLANT
SUT VIC AB 2-0 CT1 27 (SUTURE) ×4
SUT VIC AB 2-0 CT1 TAPERPNT 27 (SUTURE) ×2 IMPLANT
SUTURE FIBERWR #2 38 T-5 BLUE (SUTURE) ×5 IMPLANT
SYR CONTROL 10ML LL (SYRINGE) ×6 IMPLANT
TAPE CLOTH SURG 6X10 WHT LF (GAUZE/BANDAGES/DRESSINGS) ×3 IMPLANT
TOWEL OR 17X24 6PK STRL BLUE (TOWEL DISPOSABLE) ×3 IMPLANT
TOWEL OR 17X26 10 PK STRL BLUE (TOWEL DISPOSABLE) ×3 IMPLANT
TOWER SMARTMIX MINI (MISCELLANEOUS) IMPLANT
TRAY FOLEY MTR SLVR 16FR STAT (SET/KITS/TRAYS/PACK) IMPLANT
WATER STERILE IRR 1000ML POUR (IV SOLUTION) IMPLANT

## 2018-07-02 NOTE — Op Note (Signed)
Preop diagnosis: Right shoulder primary osteoarthritis  Postop diagnosis: Same  Procedure: Right total shoulder arthroplasty  Surgeon: Annell Greening, MD  Assistant: Zonia Kief, PA-C medically necessary and present for the entire procedure  Anesthesia preoperative interscalene block plus general anesthesia.  EBL: 200 cc  Drains: None  Implants Depuy 48 mm glenoid anchor peg.  Cemented glenoid.  Size 10 standard stem .  18 mm head.  Procedure: After preoperative block induction general anesthesia intubation patient was placed in the shoulder frame careful padding positioning including the strap across the chest.  Head was carefully checked and neck was in good position.  1015 drape was applied and ChloraPrep was performed down to the wrist.  Usual impervious stockinette split sheets drapes Covan sterile skin marker and Betadine Steri-Drape x2 was applied.  Patient had an old saber type incision for excision of the distal clavicle which was long 6 to 7 cm.  The top portion of the normal deltopectoral incision was curved and angled laterally to use the old scar.  Timeout procedure was completed Ancef was given prophylactically.  Incision was made cephalic vein was extremely small and had multiple branches off of it and it was coagulated.  Remaining portion of was taken with the deltoid laterally.  Conjoined tendon was identified biceps tendon and subscap was divided tagging sutures on each side for later repair with #2 FiberWire.  Capsule was opened and head was cut it was eburnated bone grade IV chondromalacia had large marginal osteophytes larger than visualized on preoperative radiograph due to humeral rotation.  And was measured at 18 mm.  Glenoid was exposed some marginal osteophytes removed off the glenoid and sequential preparation of the glenoid was performed for a 48 mm glenoid anchor peg.  Drilling of the 3 holes for cement after reaming was performed cement was mixed inserted with a  tiny cement gun with 3 and half turns per peg hole.  Cement was held tight until left .  Preparation of the humeral side was used with 30 degrees of version using the guide parallel to the forearm.  Sequential preparation was performed progressing up to 10 with the reamer there was bite of distal bone on the cortex and good tight proximal fill with good cancellus bone proximally and a 10 mm size 113 mm long humeral stem was selected press-fit with proximal porous coating permanent 1 was inserted after trials have been used with good position and alignment.  C-arm was brought in after humeral head was attached and C arm pictures were taken documenting head sitting parallel with the top of the greater tuberosity.  There was some additional inferior spurs that were removed with the rondure care taken to identify the position of the axillary nerve and using a Freer elevator to protect soft tissue.  Copious irrigation.  Operative field was dry.  Subtenons tissue reapproximated with 2-0 Vicryl skin staple closure postop dressing with Mepilex followed by 4 x 4's ABD and shoulder immobilizer transferred recovery room in stable condition.  Instrument count needle count was correct.

## 2018-07-02 NOTE — Transfer of Care (Signed)
Immediate Anesthesia Transfer of Care Note  Patient: [name]  Procedure(s) Performed: RIGHT TOTAL SHOULDER ARTHROPLASTY (Right Shoulder)  Patient Location: PACU  Anesthesia Type:General and Regional  Level of Consciousness: awake and drowsy  Airway & Oxygen Therapy: Patient Spontanous Breathing  Post-op Assessment: Report given to RN and Post -op Vital signs reviewed and stable  Post vital signs: Reviewed and stable  Last Vitals:  Vitals Value Taken Time  BP    Temp    Pulse 74 07/02/2018  3:31 PM  Resp 17 07/02/2018  3:31 PM  SpO2 95 % 07/02/2018  3:31 PM  Vitals shown include unvalidated device data.  Last Pain:  Vitals:   07/02/18 1200  TempSrc:   PainSc: 0-No pain         Complications: No apparent anesthesia complications

## 2018-07-02 NOTE — H&P (Signed)
Office Visit Note  Patient: [name]  Date of Birth: [birth year]  MRN: [id number]  Visit Date: 05/22/2018  Requested by: [name], MD  [address]  [place], [place] [address]  PCP: [name], MD  Assessment & Plan:  Visit Diagnoses: Right shoulder osteoarthritis  Plan: Discussed options for her for continued problems would be total shoulder arthroplasty we discussed the therapy that would be required. We discussed incision operative approach and postop therapy required to get satisfactory result. She like some time to think about this and she can return in 2 months to discuss it further.  Follow-Up Instructions: Return in about 2 years (around 05/22/2020).  Orders:  No orders of the defined types were placed in this encounter.   No orders of the defined types were placed in this encounter.   Procedures:  No procedures performed  Clinical Data:  No additional findings.  Subjective:      Chief Complaint  Patient presents with  . Right Shoulder - Pain    02/12/18 Right Shoulder Arthroscopy, Biceps Debridement, Removal of Loose Bodies   HPI patient returns post chondroplasty right shoulder removal of loose bodies. She has been to the emergency room for exacerbation of COPD since her surgery. She still has problems with outstretched reaching and overhead activity with grinding and pain particularly if she lifts something with her arm. Down below her in front of her is long as her shoulders not flex her shoulder functions much better. Patient had partial tearing the biceps tendon this was trimmed and released. She still has problems with sleeping at night if she rolls over on her right shoulder.  Review of Systems done unchanged from surgery other than as mentioned above with exacerbation of COPD and ED visit.  Objective:  Vital Signs: BP 113/63  Pulse 73  Ht 5\' 2"  (1.575 m)  Wt 149 lb (67.6 kg)  BMI 27.25 kg/m  Physical Exam  Constitutional:  Appearance: She is  well-developed.  HENT:  Head: Normocephalic.  Right Ear: External ear normal.  Left Ear: External ear normal.  Eyes:  Pupils: Pupils are equal, round, and reactive to light.  Neck:  Thyroid: No thyromegaly.  Trachea: No tracheal deviation.  Cardiovascular:  Rate and Rhythm: Normal rate.  Pulmonary:  Effort: Pulmonary effort is normal.  Breath sounds: No stridor. No wheezing.  Abdominal:  Palpations: Abdomen is soft.  Skin:  General: Skin is warm and dry.  Neurological:  Mental Status: She is alert and oriented to person, place, and time.  Psychiatric:  Behavior: Behavior normal.   Ortho Exam patient has pain with flexion over 100 degrees pain with abduction past 80 degrees. Passively using her opposite arm she can get R hand up of foot over the top of her head. Limitation of internal rotation with hand to mid axillary line.  Specialty Comments:  No specialty comments available.  Imaging:  No results found.  PMFS History:      Patient Active Problem List   Diagnosis Date Noted  . SOB (shortness of breath) 04/22/2018  . COPD exacerbation (HCC)   . Occipital neuralgia of left side 03/20/2018  . Glenohumeral arthritis, right 02/12/2018  . Tobacco use 12/24/2017  . GOLD COPD 0 with Asthmatic Component    . Biceps tendinosis of right shoulder 08/04/2017  . Pre-operative cardiovascular examination 08/01/2017  . Carotid artery disease (HCC) 07/03/2017  . DOE (dyspnea on exertion) 07/03/2017  . Chronic right shoulder pain 12/27/2016  . Constipation  04/11/2016  . Chronic diastolic heart failure (HCC) 04/07/2016  . Hyperglycemia 04/05/2016  . Syncope 11/15/2015  . Dehydration 10/05/2015  . UTI (lower urinary tract infection) 10/05/2015  . Mild dementia (HCC) 09/13/2015  . Memory problem 05/07/2015  . Neck pain 04/06/2015  . Insomnia 12/10/2014  . BMI 28.0-28.9,adult 12/10/2014  . Osteopenia 05/19/2014  . GAD (generalized anxiety disorder) 01/06/2014  . Depression  01/06/2014  . GERD (gastroesophageal reflux disease) 01/06/2014  . Vitamin D deficiency 01/06/2014  . Overactive bladder 01/06/2014  . Secondary cardiomyopathy (HCC) 10/06/2013  . Left bundle branch block 10/06/2013  . Dizziness 10/04/2013  . Osteoarthritis of right hip 03/14/2013  . Cough 02/01/2011  . Reflux 02/01/2011  . Allergic rhinitis 12/07/2008  . Chronic cough 12/07/2008  . Hepatitis B virus infection 08/16/2008  . Hyperlipidemia 08/16/2008  . Essential hypertension 08/16/2008  . Primary osteoarthritis, right shoulder 08/16/2008  . DISC DEGENERATION 07/24/2007       Past Medical History:  Diagnosis Date  . Allergic rhinitis   . Anxiety   . Arthritis   . Asthma   . CAP (community acquired pneumonia) 11/26/2017  . Depression   . Dyspnea    with exertion   . Elevated troponin 04/05/2016  . Essential hypertension, benign   . GERD (gastroesophageal reflux disease)   . Headache    occiptal neuralgia - left   . History of blood transfusion    "related to miscarriage"  . Left bundle branch block   . Memory loss   . Mixed hyperlipidemia   . Occipital neuralgia of left side   . Osteopenia   . 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 Urinary urgency         Family History  Problem Relation Age of Onset  . Diabetes Father   . Diabetes Brother   . Cancer Brother 56   colon  . Diabetes Brother   . Diabetes Brother   . Hypertension Sister   . Diabetes Sister   . Diabetes Sister   . Diabetes Other   . Allergies Other   . Diabetes Son   . Colon cancer Neg Hx   . Anesthesia problems Neg Hx   . Hypotension Neg Hx   . Malignant hyperthermia Neg Hx   . Pseudochol deficiency Neg Hx         Past Surgical History:  Procedure Laterality Date  . APPENDECTOMY    . BACK SURGERY    . CARDIAC CATHETERIZATION  2010   Normal coronaries  . CATARACT EXTRACTION W/PHACO  04/16/2011   Procedure: CATARACT EXTRACTION PHACO AND INTRAOCULAR LENS  PLACEMENT (IOC); Surgeon: Susa Simmonds; Location: [facility]; Service: Ophthalmology; Laterality: Left; CDE=11.35  . CATARACT EXTRACTION W/PHACO  01/28/2012   Procedure: CATARACT EXTRACTION PHACO AND INTRAOCULAR LENS PLACEMENT (IOC); Surgeon: Susa Simmonds, MD; Location: [facility]; Service: Ophthalmology; Laterality: Right; CDI:8.15  . CYSTO WITH HYDRODISTENSION  05/29/2012   Procedure: CYSTOSCOPY/HYDRODISTENSION; Surgeon: Martina Sinner, MD; Location: [facility]; Service: Urology; Laterality: N/A; INSTILLATION OF MARCAINE AND PYRIDIUM   . DILATION AND CURETTAGE OF UTERUS  <hysterectomy"  . HAMMER TOE SURGERY Bilateral 11/22/11   [facility], [name]  . JOINT REPLACEMENT    . LAPAROSCOPIC CHOLECYSTECTOMY  1991  . LUMBAR LAMINECTOMY  2011  . LUMBAR LAMINECTOMY/DECOMPRESSION MICRODISCECTOMY Left 06/06/2012   Procedure: LUMBAR LAMINECTOMY/DECOMPRESSION MICRODISCECTOMY 1 LEVEL; Surgeon: Maeola Harman, MD; Location: [facility]; Service: Neurosurgery; Laterality: Left; Left Lumbar two-three Laminectomy for resection  of synovial cyst  . SHOULDER ARTHROSCOPY Right 02/12/2018   SHOULDER ARTHROSCOPY, BICEPS DEBRIDEMENT, REMOVAL OF LOOSE BODIES  . SHOULDER ARTHROSCOPY Right 02/12/2018   Procedure: RIGHT SHOULDER ARTHROSCOPY, BICEPS DEBRIDEMENT, REMOVAL OF LOOSE BODIES; Surgeon: Eldred MangesYates, Nihira Puello C, MD; Location: [facility]; Service: Orthopedics; Laterality: Right;  . SHOULDER OPEN ROTATOR CUFF REPAIR Right 2011  . TONSILLECTOMY    . TOTAL HIP ARTHROPLASTY Right 03/13/2013   Procedure: TOTAL HIP ARTHROPLASTY; Surgeon: Nestor LewandowskyFrank J Rowan, MD; Location: [facility]; Service: Orthopedics; Laterality: Right;  Marland Kitchen. VAGINAL HYSTERECTOMY     Social History        Occupational History  . 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 Occupation: Part time care giver  Tobacco Use  . Smoking status: Current Some Day Smoker    Years: 60.00    Types: Cigarettes  .  Smokeless tobacco: Never Used  . Tobacco comment: 1- 2 cigs a day- 1 pkg will last 2 weeks  Substance and Sexual Activity  . Alcohol use: Not Currently    Comment: 11/27/2017 "drank some in my 20s"  . Drug use: No  . Sexual activity: Not Currently

## 2018-07-02 NOTE — Anesthesia Procedure Notes (Signed)
Anesthesia Regional Block: Supraclavicular block   Pre-Anesthetic Checklist: ,, timeout performed, Correct Patient, Correct Site, Correct Laterality, Correct Procedure, Correct Position, site marked, Risks and benefits discussed,  Surgical consent,  Pre-op evaluation,  At surgeon's request and post-op pain management  Laterality: Right and Upper  Prep: Maximum Sterile Barrier Precautions used, chloraprep       Needles:  Injection technique: Single-shot  Needle Type: Echogenic Stimulator Needle     Needle Length: 10cm      Additional Needles:   Procedures:,,,, ultrasound used (permanent image in chart),,,,  Narrative:  Start time: 07/02/2018 11:49 AM End time: 07/02/2018 11:59 AM Injection made incrementally with aspirations every 5 mL.  Performed by: Personally  Anesthesiologist: Phillips Grout, MD  Additional Notes: Risks, benefits and alternative to block explained extensively.  Patient tolerated procedure well, without complications.

## 2018-07-02 NOTE — Interval H&P Note (Signed)
History and Physical Interval Note:  07/02/2018 12:41 PM  [name]  has presented today for surgery, with the diagnosis of right shoulder osteoarthritis  The various methods of treatment have been discussed with the patient and family. After consideration of risks, benefits and other options for treatment, the patient has consented to  Procedure(s): RIGHT TOTAL SHOULDER ARTHROPLASTY (Right) as a surgical intervention .  The patient's history has been reviewed, patient examined, no change in status, stable for surgery.  I have reviewed the patient's chart and labs.  Questions were answered to the patient's satisfaction.     Eldred Manges

## 2018-07-02 NOTE — Anesthesia Procedure Notes (Signed)
Procedure Name: Intubation Date/Time: 07/02/2018 1:03 PM Performed by: Valda Favia, CRNA Pre-anesthesia Checklist: Patient identified, Emergency Drugs available, Suction available and Patient being monitored Patient Re-evaluated:Patient Re-evaluated prior to induction Oxygen Delivery Method: Circle System Utilized Preoxygenation: Pre-oxygenation with 100% oxygen Induction Type: IV induction Ventilation: Mask ventilation without difficulty Laryngoscope Size: Mac and 4 Grade View: Grade I Tube type: Oral Tube size: 7.0 mm Number of attempts: 1 Airway Equipment and Method: Stylet and Oral airway Placement Confirmation: ETT inserted through vocal cords under direct vision,  positive ETCO2 and breath sounds checked- equal and bilateral Secured at: 19 cm Tube secured with: Tape Dental Injury: Teeth and Oropharynx as per pre-operative assessment

## 2018-07-03 ENCOUNTER — Encounter: Payer: Self-pay | Admitting: Family Medicine

## 2018-07-03 ENCOUNTER — Encounter (HOSPITAL_COMMUNITY): Payer: Self-pay | Admitting: Orthopaedic Surgery

## 2018-07-03 MED ORDER — HYDROCODONE-ACETAMINOPHEN 7.5-325 MG PO TABS: 1.0000 | ORAL_TABLET | Freq: Four times a day (QID) | ORAL | 0 refills | Status: DC | PRN

## 2018-07-03 NOTE — Anesthesia Postprocedure Evaluation (Signed)
Anesthesia Post Note  Patient: [name]  Procedure(s) Performed: RIGHT TOTAL SHOULDER ARTHROPLASTY (Right Shoulder)     Patient location during evaluation: PACU Anesthesia Type: General Level of consciousness: awake and alert Pain management: pain level controlled Vital Signs Assessment: post-procedure vital signs reviewed and stable Respiratory status: spontaneous breathing, nonlabored ventilation and respiratory function stable Cardiovascular status: blood pressure returned to baseline and stable Postop Assessment: no apparent nausea or vomiting Anesthetic complications: no    Last Vitals:  Vitals:   07/03/18 0955 07/03/18 1300  BP: (!) 99/55 (!) 103/50  Pulse: 77 67  Resp:    Temp: 37.5 C 37.1 C  SpO2: (!) 89% 90%    Last Pain:  Vitals:   07/03/18 1300  TempSrc: Oral  PainSc:    Pain Goal: Patients Stated Pain Goal: 2 (07/03/18 1230)                 Lowella Curb

## 2018-07-03 NOTE — Progress Notes (Signed)
Orthopedic Tech Progress Note Patient Details:  [name] [birth year] [id number]  Ortho Devices Type of Ortho Device: Shoulder immobilizer Ortho Device/Splint Location: URE Ortho Device/Splint Interventions: Adjustment, Application, Ordered   Post Interventions Patient Tolerated: Well Instructions Provided: Care of device, Adjustment of device   Donald Pore 07/03/2018, 7:17 AM

## 2018-07-03 NOTE — Progress Notes (Signed)
Pt noted AAOX4, noted on room air. pox sat noted at 89%. Inc spirometer done and pt with return demo at 1800. Pt able to cough and deep breathe effectively. R shoulder maintained in sling and ice applied. Dressing changed staples dry and intact.   Pt verbalized understanding of discharge instructions and to maintain sling at all times, keep dressing C/D/I . And leave intact till f/u with doctor appt. . Pain rated 6/10. Follow up pain rated 3/10. Pt with grand daughter to travel home.

## 2018-07-03 NOTE — Progress Notes (Signed)
   Subjective: 1 Day Post-Op Procedure(s) (LRB): RIGHT TOTAL SHOULDER ARTHROPLASTY (Right) Patient reports pain as mild.    Objective: Vital signs in last 24 hours: Temp:  [97 F (36.1 C)-98.3 F (36.8 C)] 98 F (36.7 C) (03/05 0409) Pulse Rate:  [46-77] 77 (03/05 0409) Resp:  [12-25] 14 (03/05 0409) BP: (83-178)/(39-87) 83/39 (03/05 0409) SpO2:  [93 %-100 %] 98 % (03/05 0409) Weight:  [73 kg] 73 kg (03/04 1111)  Intake/Output from previous day: 03/04 0701 - 03/05 0700 In: 1456.9 [I.V.:1456.9] Out: 152 [Stool:2; Blood:150] Intake/Output this shift: No intake/output data recorded.  No results for input(s): HGB in the last 72 hours. No results for input(s): WBC, RBC, HCT, PLT in the last 72 hours. No results for input(s): NA, K, CL, CO2, BUN, CREATININE, GLUCOSE, CALCIUM in the last 72 hours. No results for input(s): LABPT, INR in the last 72 hours.  Neurologically intact Dg Shoulder Right  Result Date: 07/02/2018 CLINICAL DATA:  Right total shoulder arthroplasty. EXAM: DG C-ARM 61-120 MIN; RIGHT SHOULDER - 2+ VIEW COMPARISON:  MRI right shoulder dated July 12, 2017. FLUOROSCOPY TIME:  5 seconds. C-arm fluoroscopic images were obtained intraoperatively and submitted for post operative interpretation. FINDINGS: Internal and external rotation intraoperative fluoroscopic images demonstrate interval right total shoulder arthroplasty. The components are well aligned. The tip of the humeral stem is not included in the field of view. No acute osseous abnormality. IMPRESSION: 1. Intraoperative fluoroscopic guidance for right total shoulder arthroplasty. Electronically Signed   By: Obie Dredge M.D.   On: 07/02/2018 16:41   Dg C-arm 1-60 Min  Result Date: 07/02/2018 CLINICAL DATA:  Right total shoulder arthroplasty. EXAM: DG C-ARM 61-120 MIN; RIGHT SHOULDER - 2+ VIEW COMPARISON:  MRI right shoulder dated July 12, 2017. FLUOROSCOPY TIME:  5 seconds. C-arm fluoroscopic images were obtained  intraoperatively and submitted for post operative interpretation. FINDINGS: Internal and external rotation intraoperative fluoroscopic images demonstrate interval right total shoulder arthroplasty. The components are well aligned. The tip of the humeral stem is not included in the field of view. No acute osseous abnormality. IMPRESSION: 1. Intraoperative fluoroscopic guidance for right total shoulder arthroplasty. Electronically Signed   By: Obie Dredge M.D.   On: 07/02/2018 16:41    Assessment/Plan: 1 Day Post-Op Procedure(s) (LRB): RIGHT TOTAL SHOULDER ARTHROPLASTY (Right) Plan: discharge home today . Office one week   Eldred Manges 07/03/2018, 8:03 AM

## 2018-07-03 NOTE — Discharge Instructions (Signed)
Leave shoulder immobilizer on.  You can sponge bathe.  He has pain medicine as needed.  If you take too much pain medication you may get severely constipated.  You can use ice off and on on your shoulder to help with the pain.  He will do better if you are in a recliner or beachchair type position.  See Dr. [name] in 1 week.  Your pain medication is been sent to your pharmacy

## 2018-07-08 ENCOUNTER — Telehealth (INDEPENDENT_AMBULATORY_CARE_PROVIDER_SITE_OTHER): Payer: Self-pay | Admitting: Orthopaedic Surgery

## 2018-07-08 NOTE — Telephone Encounter (Signed)
I called discussed. Hand and fingers moving well. Swelling and bruising as discussed.

## 2018-07-08 NOTE — Telephone Encounter (Signed)
Pt called saying her arm is swollen and has moved down to her hand.  Pt had a shoulder replacement.

## 2018-07-08 NOTE — Telephone Encounter (Signed)
Please advise. I called patient. She stated her arm is very swollen from shoulder down to hand. Wearing sling, taking pain medication as prescribed, no fevers or chills.

## 2018-07-08 NOTE — Discharge Summary (Signed)
Patient ID: [name] MRN: [id number] DOB/AGE: [birth year] 84 y.o.  Admit date: 07/02/2018 Discharge date: 07/08/2018  Admission Diagnoses:  Active Problems:   Shoulder arthritis   Discharge Diagnoses:  Active Problems:   Shoulder arthritis  status post Procedure(s): RIGHT TOTAL SHOULDER ARTHROPLASTY  Past Medical History:  Diagnosis Date  . Allergic rhinitis   . Anxiety   . Arthritis   . Asthma   . CAP (community acquired pneumonia) 11/26/2017  . Depression   . Dyspnea    with exertion   . Elevated troponin 04/05/2016  . Essential hypertension, benign   . GERD (gastroesophageal reflux disease)   . Headache    occiptal neuralgia - left   . Heart murmur   . History of blood transfusion    "related to miscarriage"  . Left bundle branch block    in & out of A fib  . Memory loss   . Mixed hyperlipidemia   . Occipital neuralgia of left side   . Osteopenia   . 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 Urinary urgency     Surgeries: Procedure(s): RIGHT TOTAL SHOULDER ARTHROPLASTY on 07/02/2018   Consultants:   Discharged Condition: Improved  Hospital Course: [name] is an 84 y.o. female who was admitted 07/02/2018 for operative treatment of shoulder arthritis. Patient failed conservative treatments (please see the history and physical for the specifics) and had severe unremitting pain that affects sleep, daily activities and work/hobbies. After pre-op clearance, the patient was taken to the operating room on 07/02/2018 and underwent  Procedure(s): RIGHT TOTAL SHOULDER ARTHROPLASTY.    Patient was given perioperative antibiotics:  Anti-infectives (From admission, onward)   Start     Dose/Rate Route Frequency Ordered Stop   07/03/18 1000  vancomycin (VANCOCIN) IVPB 1000 mg/200 mL premix     1,000 mg 200 mL/hr over 60 Minutes Intravenous Every 24 hours 07/02/18 1958 07/03/18 1302   07/02/18 1100  vancomycin (VANCOCIN) IVPB 1000  mg/200 mL premix     1,000 mg 200 mL/hr over 60 Minutes Intravenous On call to O.R. 07/02/18 1048 07/02/18 1331       Patient was given sequential compression devices and early ambulation to prevent DVT.   Patient benefited maximally from hospital stay and there were no complications. At the time of discharge, the patient was urinating/moving their bowels without difficulty, tolerating a regular diet, pain is controlled with oral pain medications and they have been cleared by PT/OT.   Recent vital signs: No data found.   Recent laboratory studies: No results for input(s): WBC, HGB, HCT, PLT, NA, K, CL, CO2, BUN, CREATININE, GLUCOSE, INR, CALCIUM in the last 72 hours.  Invalid input(s): PT, 2   Discharge Medications:   Allergies as of 07/03/2018      Reactions   Lisinopril-hydrochlorothiazide Swelling, Other (See Comments), Cough   Lip swelling and cough. Stopped by ENT. Patient is on Losartan without any issues    Codeine Itching, Swelling   eye irritation (prescribed percocet)   Penicillins Itching, Swelling   PATIENT HAS HAD A PCN REACTION WITH IMMEDIATE RASH, FACIAL/TONGUE/THROAT SWELLING, SOB, OR LIGHTHEADEDNESS WITH HYPOTENSION:  #  #  YES  #  #  Has patient had a PCN reaction causing severe rash involving mucus membranes or skin necrosis: No Has patient had a PCN reaction that required hospitalization No Has patient had a PCN reaction occurring within the last 10 years: No eye irritation   Aricept Palma Holter[donepezil  Hcl] Nausea And Vomiting   Namenda [memantine Hcl] Nausea And Vomiting      Medication List    TAKE these medications   AeroChamber MV inhaler Use as instructed   albuterol 108 (90 Base) MCG/ACT inhaler Commonly known as:  PROVENTIL HFA;VENTOLIN HFA Inhale 1-2 puffs into the lungs every 6 (six) hours as needed for wheezing or shortness of breath.   aspirin EC 81 MG tablet Take 81 mg by mouth every evening.   azelastine 0.1 % nasal spray Commonly known as:   ASTELIN Place 2 sprays into both nostrils 2 (two) times daily. Use in each nostril as directed   Biotin 5 MG Tabs Take 5 mg by mouth every morning.   budesonide-formoterol 160-4.5 MCG/ACT inhaler Commonly known as:  SYMBICORT Inhale 2 puffs into the lungs 2 (two) times daily. What changed:    when to take this  reasons to take this   buPROPion 150 MG 12 hr tablet Commonly known as:  WELLBUTRIN SR TAKE (1) TABLET TWICE A DAY. What changed:    how much to take  how to take this  when to take this  additional instructions   carvedilol 6.25 MG tablet Commonly known as:  COREG TAKE  (1)  TABLET TWICE A DAY WITH MEALS (BREAKFAST AND SUPPER) What changed:    how much to take  how to take this  when to take this  additional instructions   CENTRUM SILVER PO Take 1 tablet by mouth every morning.   esomeprazole 40 MG capsule Commonly known as:  NEXIUM Take 1 capsule (40 mg total) by mouth daily before breakfast.   furosemide 20 MG tablet Commonly known as:  LASIX Take 1 tablet (20 mg total) by mouth daily. Pt to take 40 mg for 2 days then decrease to 20 mg daily What changed:    when to take this  additional instructions   gabapentin 400 MG capsule Commonly known as:  NEURONTIN Take 1 capsule (400 mg total) by mouth 3 (three) times daily.   HYDROcodone-acetaminophen 7.5-325 MG tablet Commonly known as:  Norco Take 1 tablet by mouth every 6 (six) hours as needed for moderate pain. post op pain   loratadine 10 MG tablet Commonly known as:  CLARITIN Take 1 tablet (10 mg total) by mouth daily. What changed:  when to take this   losartan 50 MG tablet Commonly known as:  COZAAR Take 1/2 tablet (25 mg total) by mouth daily. What changed:    how much to take  how to take this  when to take this  additional instructions   memantine 5 MG tablet Commonly known as:  Namenda Take 1 tablet (5 mg total) by mouth 2 (two) times daily.   ondansetron 4 MG  tablet Commonly known as:  ZOFRAN Take 1 tablet (4 mg total) by mouth every 8 (eight) hours as needed for nausea or vomiting.   potassium chloride SA 20 MEQ tablet Commonly known as:  K-DUR,KLOR-CON Take 1 tablet (20 mEq total) by mouth daily. What changed:  when to take this   pravastatin 80 MG tablet Commonly known as:  PRAVACHOL Take 1 tablet (80 mg total) by mouth daily. What changed:  when to take this   raloxifene 60 MG tablet Commonly known as:  EVISTA TAKE 1 TABLET DAILY   vitamin B-12 1000 MCG tablet Commonly known as:  CYANOCOBALAMIN Take 1,000 mcg by mouth daily.       Diagnostic Studies: Dg Shoulder Right  Result  Date: 07/02/2018 CLINICAL DATA:  Right total shoulder arthroplasty. EXAM: DG C-ARM 61-120 MIN; RIGHT SHOULDER - 2+ VIEW COMPARISON:  MRI right shoulder dated July 12, 2017. FLUOROSCOPY TIME:  5 seconds. C-arm fluoroscopic images were obtained intraoperatively and submitted for post operative interpretation. FINDINGS: Internal and external rotation intraoperative fluoroscopic images demonstrate interval right total shoulder arthroplasty. The components are well aligned. The tip of the humeral stem is not included in the field of view. No acute osseous abnormality. IMPRESSION: 1. Intraoperative fluoroscopic guidance for right total shoulder arthroplasty. Electronically Signed   By: Obie Dredge M.D.   On: 07/02/2018 16:41   Dg C-arm 1-60 Min  Result Date: 07/02/2018 CLINICAL DATA:  Right total shoulder arthroplasty. EXAM: DG C-ARM 61-120 MIN; RIGHT SHOULDER - 2+ VIEW COMPARISON:  MRI right shoulder dated July 12, 2017. FLUOROSCOPY TIME:  5 seconds. C-arm fluoroscopic images were obtained intraoperatively and submitted for post operative interpretation. FINDINGS: Internal and external rotation intraoperative fluoroscopic images demonstrate interval right total shoulder arthroplasty. The components are well aligned. The tip of the humeral stem is not included in the  field of view. No acute osseous abnormality. IMPRESSION: 1. Intraoperative fluoroscopic guidance for right total shoulder arthroplasty. Electronically Signed   By: Obie Dredge M.D.   On: 07/02/2018 16:41      Follow-up Information    Eldred Manges, MD Follow up in 1 week(s).   Specialty:  Orthopedic Surgery Contact information: [address] [place] [address] [phone]           Discharge Plan:  discharge to home  Disposition:     Signed: Zonia Kief for Annell Greening MD 07/08/2018, 9:22 AM

## 2018-07-17 ENCOUNTER — Ambulatory Visit (INDEPENDENT_AMBULATORY_CARE_PROVIDER_SITE_OTHER): Payer: Medicare Other | Admitting: Orthopaedic Surgery

## 2018-07-17 ENCOUNTER — Other Ambulatory Visit: Payer: Self-pay | Admitting: Family Medicine

## 2018-07-17 ENCOUNTER — Ambulatory Visit (INDEPENDENT_AMBULATORY_CARE_PROVIDER_SITE_OTHER): Payer: Medicare Other

## 2018-07-17 ENCOUNTER — Other Ambulatory Visit: Payer: Self-pay

## 2018-07-17 ENCOUNTER — Encounter (INDEPENDENT_AMBULATORY_CARE_PROVIDER_SITE_OTHER): Payer: Self-pay | Admitting: Orthopaedic Surgery

## 2018-07-17 VITALS — Temp 97.8°F | Ht 62.0 in | Wt 161.0 lb

## 2018-07-17 DIAGNOSIS — Z96611 Presence of right artificial shoulder joint: Secondary | ICD-10-CM

## 2018-07-17 DIAGNOSIS — M5481 Occipital neuralgia: Secondary | ICD-10-CM

## 2018-07-17 DIAGNOSIS — M19011 Primary osteoarthritis, right shoulder: Secondary | ICD-10-CM

## 2018-07-17 NOTE — Progress Notes (Signed)
Post-Op Visit Note   Patient: [name]           Date of Birth: [birth year]           MRN: [id number] Visit Date: 07/17/2018 PCP: Sonny Masters, FNP   Assessment & Plan: Postop right total shoulder arthroplasty.  Incision looks good staples removed Steri-Strips applied.  We will set rehab up at [facility] outpatient physical therapy facility.  Recheck 5 weeks.  Chief Complaint:  Chief Complaint  Patient presents with  . Right Shoulder - Routine Post Op    07/02/2018 Right Total Shoulder Arthroplasty   Visit Diagnoses:  1. Primary osteoarthritis, right shoulder   2. H/O total shoulder replacement, right     Plan: She can start doing some small paint  circles with her arm out of the sling.  She will avoid abduction external rotation together due to subscap repair.  Proceed with outpatient therapy and recheck in 5 weeks.  She can remove the Steri-Strips in 1 to 2 weeks if they are still present.  Okay to shower.  Follow-Up Instructions: Return in about 5 weeks (around 08/21/2018).   Orders:  Orders Placed This Encounter  Procedures  . XR Shoulder Right  . Ambulatory referral to Physical Therapy   No orders of the defined types were placed in this encounter.   Imaging: Xr Shoulder Right  Result Date: 07/17/2018 [facility] lateral right shoulder obtained and reviewed this shows total shoulder arthroplasty good position and stem glenoid component, cement and ball. Impression: Satisfactory right total shoulder arthroplasty.   PMFS History: Patient Active Problem List   Diagnosis Date Noted  . H/O total shoulder replacement, right 07/17/2018  . Shoulder arthritis 07/02/2018  . Dementia without behavioral disturbance (HCC) 05/29/2018  . SOB (shortness of breath) 04/22/2018  . COPD exacerbation (HCC)   . Occipital neuralgia of left side 03/20/2018  . Glenohumeral arthritis, right 02/12/2018  . Tobacco use 12/24/2017  . GOLD COPD 0 with Asthmatic Component    . Biceps  tendinosis of right shoulder 08/04/2017  . Pre-operative cardiovascular examination 08/01/2017  . Carotid artery disease (HCC) 07/03/2017  . DOE (dyspnea on exertion) 07/03/2017  . Chronic right shoulder pain 12/27/2016  . Constipation 04/11/2016  . Chronic diastolic heart failure (HCC) 04/07/2016  . Hyperglycemia 04/05/2016  . Syncope 11/15/2015  . Dehydration 10/05/2015  . UTI (lower urinary tract infection) 10/05/2015  . Mild dementia (HCC) 09/13/2015  . Memory problem 05/07/2015  . Neck pain 04/06/2015  . Insomnia 12/10/2014  . BMI 28.0-28.9,adult 12/10/2014  . Osteopenia 05/19/2014  . GAD (generalized anxiety disorder) 01/06/2014  . Depression 01/06/2014  . GERD (gastroesophageal reflux disease) 01/06/2014  . Vitamin D deficiency 01/06/2014  . Overactive bladder 01/06/2014  . Secondary cardiomyopathy (HCC) 10/06/2013  . Left bundle branch block 10/06/2013  . Dizziness 10/04/2013  . Osteoarthritis of right hip 03/14/2013  . Cough 02/01/2011  . Reflux 02/01/2011  . Allergic rhinitis 12/07/2008  . Chronic cough 12/07/2008  . Hepatitis B virus infection 08/16/2008  . Hyperlipidemia 08/16/2008  . Essential hypertension 08/16/2008  . DISC DEGENERATION 07/24/2007   Past Medical History:  Diagnosis Date  . Allergic rhinitis   . Anxiety   . Arthritis   . Asthma   . CAP (community acquired pneumonia) 11/26/2017  . Depression   . Dyspnea    with exertion   . Elevated troponin 04/05/2016  . Essential hypertension, benign   . GERD (gastroesophageal reflux disease)   . Headache  occiptal neuralgia - left   . Heart murmur   . History of blood transfusion    "related to miscarriage"  . Left bundle branch block    in & out of A fib  . Memory loss   . Mixed hyperlipidemia   . Occipital neuralgia of left side   . Osteopenia   . 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 Urinary urgency     Family History  Problem Relation Age of Onset  .  Diabetes Father   . Diabetes Brother   . Cancer Brother [phone]       colon  . Diabetes Brother   . Diabetes Brother   . Hypertension Sister   . Diabetes Sister   . Diabetes Sister   . Diabetes Other   . Allergies Other   . Diabetes Son   . Colon cancer Neg Hx   . Anesthesia problems Neg Hx   . Hypotension Neg Hx   . Malignant hyperthermia Neg Hx   . Pseudochol deficiency Neg Hx     Past Surgical History:  Procedure Laterality Date  . APPENDECTOMY    . BACK SURGERY    . CARDIAC CATHETERIZATION  2010   Normal coronaries  . CATARACT EXTRACTION W/PHACO  04/16/2011   Procedure: CATARACT EXTRACTION PHACO AND INTRAOCULAR LENS PLACEMENT (IOC);  Surgeon: Susa Simmondsarroll F Haines;  Location: [facility];  Service: Ophthalmology;  Laterality: Left;  CDE=11.35  . CATARACT EXTRACTION W/PHACO  01/28/2012   Procedure: CATARACT EXTRACTION PHACO AND INTRAOCULAR LENS PLACEMENT (IOC);  Surgeon: Susa Simmondsarroll F Haines, MD;  Location: [facility];  Service: Ophthalmology;  Laterality: Right;  CDI:8.15  . CYSTO WITH HYDRODISTENSION  05/29/2012   Procedure: CYSTOSCOPY/HYDRODISTENSION;  Surgeon: Martina SinnerScott A MacDiarmid, MD;  [facility];  Service: Urology;  Laterality: N/A;  INSTILLATION OF MARCAINE AND PYRIDIUM   . DILATION AND CURETTAGE OF UTERUS  <hysterectomy"  . EYE SURGERY    . HAMMER TOE SURGERY Bilateral 11/22/11   [facility], [facility]  . JOINT REPLACEMENT    . LAPAROSCOPIC CHOLECYSTECTOMY  1991  . LUMBAR LAMINECTOMY  2011  . LUMBAR LAMINECTOMY/DECOMPRESSION MICRODISCECTOMY Left 06/06/2012   Procedure: LUMBAR LAMINECTOMY/DECOMPRESSION MICRODISCECTOMY 1 LEVEL;  Surgeon: Maeola HarmanJoseph Stern, MD;  Location: [facility];  Service: Neurosurgery;  Laterality: Left;  Left Lumbar two-three Laminectomy for resection of synovial cyst  . SHOULDER ARTHROSCOPY Right 02/12/2018   SHOULDER ARTHROSCOPY, BICEPS DEBRIDEMENT, REMOVAL OF LOOSE BODIES  . SHOULDER ARTHROSCOPY Right 02/12/2018   Procedure: RIGHT SHOULDER ARTHROSCOPY, BICEPS  DEBRIDEMENT, REMOVAL OF LOOSE BODIES;  Surgeon: Eldred MangesYates, Uriel Dowding C, MD;  Location: [facility];  Service: Orthopedics;  Laterality: Right;  . SHOULDER OPEN ROTATOR CUFF REPAIR Right 2011  . TONSILLECTOMY    . TOTAL HIP ARTHROPLASTY Right 03/13/2013   Procedure: TOTAL HIP ARTHROPLASTY;  Surgeon: Nestor LewandowskyFrank J Rowan, MD;  Location: [facility];  Service: Orthopedics;  Laterality: Right;  . TOTAL SHOULDER ARTHROPLASTY Right 07/02/2018   Procedure: RIGHT TOTAL SHOULDER ARTHROPLASTY;  Surgeon: Eldred MangesYates, Maureena Dabbs C, MD;  Location: [facility];  Service: Orthopedics;  Laterality: Right;  Marland Kitchen. VAGINAL HYSTERECTOMY     Social History   Occupational History  . 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 Occupation: Part time care giver  Tobacco Use  . Smoking status: Light Tobacco Smoker    Years: 60.00    Types: Cigarettes  . Smokeless tobacco: Never Used  Substance and Sexual Activity  . Alcohol use: Not Currently    Comment: 11/27/2017 "drank some in my 20s"  . Drug use: No  . Sexual activity: Not Currently

## 2018-07-22 ENCOUNTER — Telehealth (INDEPENDENT_AMBULATORY_CARE_PROVIDER_SITE_OTHER): Payer: Self-pay | Admitting: Orthopaedic Surgery

## 2018-07-22 NOTE — Telephone Encounter (Signed)
All information faxed to [place] with [facility].

## 2018-07-22 NOTE — Telephone Encounter (Signed)
I called patient's daughter and advised. She is also requesting a home health aide to help her mother with her bath and ADL's.  I explained I would have to send to Dr. [name] for approval. [name] also expressed patient's other daughter Dedra Skeens was supposed to call me with a question and asked if I had a message from her. I explained there was only one message in the chart. [name] would like for me to call Gwen to see what she needs.   Please advise on order for [facility] Aide.

## 2018-07-22 NOTE — Telephone Encounter (Signed)
Ok

## 2018-07-22 NOTE — Telephone Encounter (Signed)
Please advise 

## 2018-07-22 NOTE — Telephone Encounter (Signed)
Patients daughter [name] called to state pt is needing HH service and PT at home. Unablae to get out.  Please call patients daughter to advise

## 2018-07-22 NOTE — Telephone Encounter (Signed)
Per Dr. [name], ok for HHPT. He spoke with patient and she is taking her own showers, loading the dishwasher, etc. He expressed the concern for her with multiple people coming into her home, especially under the current circumstance of COVID-19 if she was not having extreme difficulty with ADL's. Patient decided she would like to hold off on aide as well.

## 2018-07-22 NOTE — Telephone Encounter (Signed)
OK 

## 2018-07-22 NOTE — Telephone Encounter (Signed)
New Message  Pts daughter verbalized pt needs letter from Dr. [name] stating she is able to go back to work so patient can continue to get paid if pts job has a letter stating she can go back to work.  Pts daughter verbalized she's been driving and doing much better.  Please f/u with pts daughter

## 2018-07-23 ENCOUNTER — Telehealth (INDEPENDENT_AMBULATORY_CARE_PROVIDER_SITE_OTHER): Payer: Self-pay | Admitting: Orthopaedic Surgery

## 2018-07-23 NOTE — Telephone Encounter (Signed)
Return to work Engineer, maintenance. I called Malachi Bonds and verified email address. She discussed having someone come to her mother's house again to help with ADL's.  I explained that we cannot release the patient to go back to work and say that she needs help at home. She expressed understanding and wanted the return to work note.

## 2018-07-23 NOTE — Telephone Encounter (Signed)
I called discussed. Can have OP therapy and RTW or HH therapy and no work. She will discuss with pt and her sister and let us know. Daughter now [place]OW who lives here and can help with transportation.

## 2018-07-23 NOTE — Telephone Encounter (Signed)
Duplicate message in chart.  See other message.

## 2018-07-23 NOTE — Telephone Encounter (Signed)
Patient daughter called requesting to Write a note that [name] can return to work. Need to know if orders cna be set up for PT to be set up in [facility].  [phone]  Wants the note for work to be emailed to [email or web address]  There were several people on phone call speaking. Please check email address.

## 2018-07-24 ENCOUNTER — Other Ambulatory Visit: Payer: Self-pay

## 2018-07-24 ENCOUNTER — Ambulatory Visit (INDEPENDENT_AMBULATORY_CARE_PROVIDER_SITE_OTHER): Payer: Medicare Other | Admitting: Orthopaedic Surgery

## 2018-07-29 ENCOUNTER — Ambulatory Visit: Payer: Medicare Other | Admitting: Neurology

## 2018-07-29 ENCOUNTER — Other Ambulatory Visit: Payer: Self-pay | Admitting: Cardiology

## 2018-07-30 ENCOUNTER — Ambulatory Visit: Payer: Medicare Other | Admitting: Physical Therapy

## 2018-07-31 ENCOUNTER — Ambulatory Visit: Payer: Medicare Other | Attending: Orthopaedic Surgery | Admitting: Physical Therapy

## 2018-07-31 ENCOUNTER — Other Ambulatory Visit: Payer: Self-pay

## 2018-07-31 ENCOUNTER — Encounter: Payer: Self-pay | Admitting: Family Medicine

## 2018-07-31 ENCOUNTER — Ambulatory Visit (INDEPENDENT_AMBULATORY_CARE_PROVIDER_SITE_OTHER): Payer: Medicare Other | Admitting: Family Medicine

## 2018-07-31 ENCOUNTER — Ambulatory Visit: Payer: Medicare Other | Admitting: Physical Therapy

## 2018-07-31 ENCOUNTER — Encounter: Payer: Self-pay | Admitting: Physical Therapy

## 2018-07-31 DIAGNOSIS — M25611 Stiffness of right shoulder, not elsewhere classified: Secondary | ICD-10-CM

## 2018-07-31 DIAGNOSIS — M6281 Muscle weakness (generalized): Secondary | ICD-10-CM | POA: Diagnosis present

## 2018-07-31 DIAGNOSIS — M25472 Effusion, left ankle: Secondary | ICD-10-CM

## 2018-07-31 DIAGNOSIS — M25511 Pain in right shoulder: Secondary | ICD-10-CM

## 2018-07-31 NOTE — Progress Notes (Signed)
Virtual Visit via telephone Note Due to COVID-19, visit is conducted virtually and was requested by patient.  I connected with [name] on 07/31/18 at 1410 by telephone and verified that I am speaking with the correct person using two identifiers. ROYALE ALFRED is currently located at home and family is currently with them during visit. The provider, Kari Baars, FNP is located in their office at time of visit.  I discussed the limitations, risks, security and privacy concerns of performing an evaluation and management service by telephone and the availability of in person appointments. I also discussed with the patient that there may be a patient responsible charge related to this service. The patient expressed understanding and agreed to proceed.  Subjective:  Patient ID: [name], female    DOB: 1934/12/21, 84 y.o.   MRN: [id number]  Chief Complaint:  Joint Swelling   HPI: [name] is a 84 y.o. female presenting on 07/31/2018 for Joint Swelling   Pt reports left ankle swelling and tenderness. Pt states the swelling started today. She denies injury. States she has pain with ambulating. She has not taken anything for the pain. She denies increased shortness of breath, weight gain, orthopnea, palpitations, chest pain, or weakness. No bruising or erythema. States the pain is aching, 4/10 at worst.    Relevant past medical, surgical, family, and social history reviewed and updated as indicated.  Allergies and medications reviewed and updated.   Past Medical History:  Diagnosis Date  . Allergic rhinitis   . Anxiety   . Arthritis   . Asthma   . CAP (community acquired pneumonia) 11/26/2017  . Depression   . Dyspnea    with exertion   . Elevated troponin 04/05/2016  . Essential hypertension, benign   . GERD (gastroesophageal reflux disease)   . Headache    occiptal neuralgia - left   . Heart murmur   . History of blood transfusion    "related to  miscarriage"  . Left bundle branch block    in & out of A fib  . Memory loss   . Mixed hyperlipidemia   . Occipital neuralgia of left side   . Osteopenia   . 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 Urinary urgency     Past Surgical History:  Procedure Laterality Date  . APPENDECTOMY    . BACK SURGERY    . CARDIAC CATHETERIZATION  2010   Normal coronaries  . CATARACT EXTRACTION W/PHACO  04/16/2011   Procedure: CATARACT EXTRACTION PHACO AND INTRAOCULAR LENS PLACEMENT (IOC);  Surgeon: Susa Simmonds;  Location: [facility];  Service: Ophthalmology;  Laterality: Left;  CDE=11.35  . CATARACT EXTRACTION W/PHACO  01/28/2012   Procedure: CATARACT EXTRACTION PHACO AND INTRAOCULAR LENS PLACEMENT (IOC);  Surgeon: Susa Simmonds, MD;  Location: [facility];  Service: Ophthalmology;  Laterality: Right;  CDI:8.15  . CYSTO WITH HYDRODISTENSION  05/29/2012   Procedure: CYSTOSCOPY/HYDRODISTENSION;  Surgeon: Martina Sinner, MD;  [facility];  Service: Urology;  Laterality: N/A;  INSTILLATION OF MARCAINE AND PYRIDIUM   . DILATION AND CURETTAGE OF UTERUS  <hysterectomy"  . EYE SURGERY    . HAMMER TOE SURGERY Bilateral 11/22/11   [facility], [name]  . JOINT REPLACEMENT    . LAPAROSCOPIC CHOLECYSTECTOMY  1991  . LUMBAR LAMINECTOMY  2011  . LUMBAR LAMINECTOMY/DECOMPRESSION MICRODISCECTOMY Left 06/06/2012   Procedure: LUMBAR LAMINECTOMY/DECOMPRESSION MICRODISCECTOMY 1 LEVEL;  Surgeon: Maeola Harman, MD;  Location:  [facility];  Service: Neurosurgery;  Laterality: Left;  Left Lumbar two-three Laminectomy for resection of synovial cyst  . SHOULDER ARTHROSCOPY Right 02/12/2018   SHOULDER ARTHROSCOPY, BICEPS DEBRIDEMENT, REMOVAL OF LOOSE BODIES  . SHOULDER ARTHROSCOPY Right 02/12/2018   Procedure: RIGHT SHOULDER ARTHROSCOPY, BICEPS DEBRIDEMENT, REMOVAL OF LOOSE BODIES;  Surgeon: Eldred Manges, MD;  Location: [facility];  Service: Orthopedics;  Laterality: Right;  . SHOULDER  OPEN ROTATOR CUFF REPAIR Right 2011  . TONSILLECTOMY    . TOTAL HIP ARTHROPLASTY Right 03/13/2013   Procedure: TOTAL HIP ARTHROPLASTY;  Surgeon: Nestor Lewandowsky, MD;  Location: [facility];  Service: Orthopedics;  Laterality: Right;  . TOTAL SHOULDER ARTHROPLASTY Right 07/02/2018   Procedure: RIGHT TOTAL SHOULDER ARTHROPLASTY;  Surgeon: Eldred Manges, MD;  Location: [facility];  Service: Orthopedics;  Laterality: Right;  Marland Kitchen VAGINAL HYSTERECTOMY      Social History   Socioeconomic History  . Marital status: Widowed    Spouse name: Not on file  . Number of children: 5  . Years of education: 8th grade  . Highest education level: Not on file  Occupational History  . Occupation: Worked in Geneticist, molecular for 30 years    Employer: RETIRED    Comment: Retired  . Occupation: Part time care giver  Social Needs  . Financial resource strain: Somewhat hard  . Food insecurity:    Worry: Sometimes true    Inability: Never true  . Transportation needs:    Medical: No    Non-medical: No  Tobacco Use  . Smoking status: Light Tobacco Smoker    Years: 60.00    Types: Cigarettes  . Smokeless tobacco: Never Used  Substance and Sexual Activity  . Alcohol use: Not Currently    Comment: 11/27/2017 "drank some in my 20s"  . Drug use: No  . Sexual activity: Not Currently  Lifestyle  . Physical activity:    Days per week: 0 days    Minutes per session: 0 min  . Stress: Only a little  Relationships  . Social connections:    Talks on phone: More than three times a week    Gets together: More than three times a week    Attends religious service: More than 4 times per year    Active member of club or organization: Yes    Attends meetings of clubs or organizations: More than 4 times per year    Relationship status: Widowed  . Intimate partner violence:    Fear of current or ex partner: No    Emotionally abused: No    Physically abused: No    Forced sexual activity: No  Other Topics Concern  . Not  on file  Social History Narrative   Ms Burum is divorced. Her adult son lives with her and he is disabled due to bilateral AKA due to diabetes.  She helps to provide care to him. She has three adult daughters that live in the [place] area. She talks with them daily and they come to visit once or twice a year. She has 7 grandchildren also.     Outpatient Encounter Medications as of 07/31/2018  Medication Sig  . albuterol (PROVENTIL HFA;VENTOLIN HFA) 108 (90 Base) MCG/ACT inhaler Inhale 1-2 puffs into the lungs every 6 (six) hours as needed for wheezing or shortness of breath.  Marland Kitchen aspirin EC 81 MG tablet Take 81 mg by mouth every evening.   Marland Kitchen azelastine (ASTELIN) 0.1 % nasal spray Place 2 sprays into  both nostrils 2 (two) times daily. Use in each nostril as directed  . Biotin 5 MG TABS Take 5 mg by mouth every morning.   . budesonide-formoterol (SYMBICORT) 160-4.5 MCG/ACT inhaler Inhale 2 puffs into the lungs 2 (two) times daily. (Patient taking differently: Inhale 2 puffs into the lungs 2 (two) times daily as needed (shortness of breath). )  . buPROPion (WELLBUTRIN SR) 150 MG 12 hr tablet TAKE (1) TABLET TWICE A DAY. (Patient taking differently: Take 150 mg by mouth 2 (two) times daily. )  . carvedilol (COREG) 6.25 MG tablet TAKE  (1)  TABLET TWICE A DAY WITH MEALS (BREAKFAST AND SUPPER) (Patient taking differently: Take 6.25 mg by mouth 2 (two) times daily with a meal. )  . esomeprazole (NEXIUM) 40 MG capsule Take 1 capsule (40 mg total) by mouth daily before breakfast.  . furosemide (LASIX) 20 MG tablet TAKE 2 TABLETS DAILY FOR 2 DAYS, THEN DECREASE BACK TO 1 DAILY  . gabapentin (NEURONTIN) 400 MG capsule TAKE (1) CAPSULE THREE TIMES DAILY.  Marland Kitchen HYDROcodone-acetaminophen (NORCO) 7.5-325 MG tablet Take 1 tablet by mouth every 6 (six) hours as needed for moderate pain. post op pain  . loratadine (CLARITIN) 10 MG tablet Take 1 tablet (10 mg total) by mouth daily. (Patient taking differently: Take 10 mg by  mouth every morning. )  . losartan (COZAAR) 50 MG tablet Take 1/2 tablet (25 mg total) by mouth daily. (Patient taking differently: Take 25 mg by mouth every morning. )  . memantine (NAMENDA) 5 MG tablet Take 1 tablet (5 mg total) by mouth 2 (two) times daily.  . Multiple Vitamins-Minerals (CENTRUM SILVER PO) Take 1 tablet by mouth every morning.   . ondansetron (ZOFRAN) 4 MG tablet Take 1 tablet (4 mg total) by mouth every 8 (eight) hours as needed for nausea or vomiting.  . potassium chloride SA (K-DUR,KLOR-CON) 20 MEQ tablet Take 1 tablet (20 mEq total) by mouth daily. (Patient taking differently: Take 20 mEq by mouth every morning. )  . pravastatin (PRAVACHOL) 80 MG tablet Take 1 tablet (80 mg total) by mouth daily. (Patient taking differently: Take 80 mg by mouth at bedtime. )  . raloxifene (EVISTA) 60 MG tablet TAKE 1 TABLET DAILY (Patient taking differently: Take 60 mg by mouth daily. )  . Spacer/Aero-Holding Chambers (AEROCHAMBER MV) inhaler Use as instructed  . vitamin B-12 (CYANOCOBALAMIN) 1000 MCG tablet Take 1,000 mcg by mouth daily.   No facility-administered encounter medications on file as of 07/31/2018.     Allergies  Allergen Reactions  . Lisinopril-Hydrochlorothiazide Swelling, Other (See Comments) and Cough    Lip swelling and cough. Stopped by ENT. Patient is on Losartan without any issues   . Codeine Itching and Swelling    eye irritation (prescribed percocet)  . Penicillins Itching and Swelling    PATIENT HAS HAD A PCN REACTION WITH IMMEDIATE RASH, FACIAL/TONGUE/THROAT SWELLING, SOB, OR LIGHTHEADEDNESS WITH HYPOTENSION:  #  #  YES  #  #  Has patient had a PCN reaction causing severe rash involving mucus membranes or skin necrosis: No Has patient had a PCN reaction that required hospitalization No Has patient had a PCN reaction occurring within the last 10 years: No eye irritation  . Aricept [Donepezil Hcl] Nausea And Vomiting  . Namenda [Memantine Hcl] Nausea And  Vomiting    Review of Systems  Constitutional: Negative for chills, fatigue and fever.  Respiratory: Negative for cough, chest tightness and shortness of breath.   Cardiovascular: Positive for  leg swelling. Negative for chest pain and palpitations.  Gastrointestinal: Negative for abdominal distention.  Musculoskeletal: Positive for arthralgias (left ankle) and joint swelling (left ankle).  Skin: Negative for color change, rash and wound.  Neurological: Negative for dizziness, syncope, weakness, light-headedness and headaches.  Psychiatric/Behavioral: Negative for confusion.  All other systems reviewed and are negative.        Observations/Objective: No vital signs or physical exam, this was a telephone or virtual health encounter.  Pt alert and oriented, answers all questions appropriately, and able to speak in full sentences.    Assessment and Plan: Hilda LiasMarie was seen today for joint swelling.  Diagnoses and all orders for this visit:  Left ankle swelling Due to COVID-19, will initiate symptomatic care at home. If symptoms persist or worsen, will bring into the office for imaging. Unilateral lower extremity swelling with tenderness and without dyspnea or weight gain. Unlikely worsening heart failure. Will hold off on increasing Lasix at this time. Pt aware to report any new or worsening symptoms. Pt aware to report any weight changes 3 pounds or more. Limit sodium intake.    Follow Up Instructions: Return if symptoms worsen or fail to improve.    I discussed the assessment and treatment plan with the patient. The patient was provided an opportunity to ask questions and all were answered. The patient agreed with the plan and demonstrated an understanding of the instructions.   The patient was advised to call back or seek an in-person evaluation if the symptoms worsen or if the condition fails to improve as anticipated.  The above assessment and management plan was discussed with  the patient. The patient verbalized understanding of and has agreed to the management plan. Patient is aware to call the clinic if symptoms persist or worsen. Patient is aware when to return to the clinic for a follow-up visit. Patient educated on when it is appropriate to go to the emergency department.    I provided 15 minutes of non-face-to-face time during this encounter. The call started at 1410. The call ended at 1425.   Kari BaarsMichelle , FNP-C [facility] [address] [place], [place] [address] [phone]

## 2018-07-31 NOTE — Therapy (Signed)
[facility] [address] [place], [place], [address] Phone: [phone]   Fax:  [phone]  Physical Therapy Evaluation  Patient Details  Name: [name] MRN: [id number] Date of Birth: [birth year] Referring Provider (PT): Annell Greening, MD   Encounter Date: 07/31/2018  PT End of Session - 07/31/18 1025    Visit Number  1    Number of Visits  16    Date for PT Re-Evaluation  10/02/18    PT Start Time  0946    PT Stop Time  1031    PT Time Calculation (min)  45 min    Activity Tolerance  Patient tolerated treatment well    Behavior During Therapy  [facility] for tasks assessed/performed       Past Medical History:  Diagnosis Date  . Allergic rhinitis   . Anxiety   . Arthritis   . Asthma   . CAP (community acquired pneumonia) 11/26/2017  . Depression   . Dyspnea    with exertion   . Elevated troponin 04/05/2016  . Essential hypertension, benign   . GERD (gastroesophageal reflux disease)   . Headache    occiptal neuralgia - left   . Heart murmur   . History of blood transfusion    "related to miscarriage"  . Left bundle branch block    in & out of A fib  . Memory loss   . Mixed hyperlipidemia   . Occipital neuralgia of left side   . Osteopenia   . 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 Urinary urgency     Past Surgical History:  Procedure Laterality Date  . APPENDECTOMY    . BACK SURGERY    . CARDIAC CATHETERIZATION  2010   Normal coronaries  . CATARACT EXTRACTION W/PHACO  04/16/2011   Procedure: CATARACT EXTRACTION PHACO AND INTRAOCULAR LENS PLACEMENT (IOC);  Surgeon: Susa Simmonds;  Location: [facility];  Service: Ophthalmology;  Laterality: Left;  CDE=11.35  . CATARACT EXTRACTION W/PHACO  01/28/2012   Procedure: CATARACT EXTRACTION PHACO AND INTRAOCULAR LENS PLACEMENT (IOC);  Surgeon: Susa Simmonds, MD;  Location: [facility];  Service: Ophthalmology;  Laterality: Right;  CDI:8.15  . CYSTO  WITH HYDRODISTENSION  05/29/2012   Procedure: CYSTOSCOPY/HYDRODISTENSION;  Surgeon: Martina Sinner, MD;  [facility];  Service: Urology;  Laterality: N/A;  INSTILLATION OF MARCAINE AND PYRIDIUM   . DILATION AND CURETTAGE OF UTERUS  <hysterectomy"  . EYE SURGERY    . HAMMER TOE SURGERY Bilateral 11/22/11   [facility], [name]  . JOINT REPLACEMENT    . LAPAROSCOPIC CHOLECYSTECTOMY  1991  . LUMBAR LAMINECTOMY  2011  . LUMBAR LAMINECTOMY/DECOMPRESSION MICRODISCECTOMY Left 06/06/2012   Procedure: LUMBAR LAMINECTOMY/DECOMPRESSION MICRODISCECTOMY 1 LEVEL;  Surgeon: Maeola Harman, MD;  Location: [facility];  Service: Neurosurgery;  Laterality: Left;  Left Lumbar two-three Laminectomy for resection of synovial cyst  . SHOULDER ARTHROSCOPY Right 02/12/2018   SHOULDER ARTHROSCOPY, BICEPS DEBRIDEMENT, REMOVAL OF LOOSE BODIES  . SHOULDER ARTHROSCOPY Right 02/12/2018   Procedure: RIGHT SHOULDER ARTHROSCOPY, BICEPS DEBRIDEMENT, REMOVAL OF LOOSE BODIES;  Surgeon: Eldred Manges, MD;  Location: [facility];  Service: Orthopedics;  Laterality: Right;  . SHOULDER OPEN ROTATOR CUFF REPAIR Right 2011  . TONSILLECTOMY    . TOTAL HIP ARTHROPLASTY Right 03/13/2013   Procedure: TOTAL HIP ARTHROPLASTY;  Surgeon: Nestor Lewandowsky, MD;  Location: [facility];  Service: Orthopedics;  Laterality: Right;  . TOTAL SHOULDER ARTHROPLASTY Right 07/02/2018  Procedure: RIGHT TOTAL SHOULDER ARTHROPLASTY;  Surgeon: Eldred Manges, MD;  Location: [facility] OR;  Service: Orthopedics;  Laterality: Right;  Marland Kitchen VAGINAL HYSTERECTOMY      There were no vitals filed for this visit.   Subjective Assessment - 07/31/18 1049    Subjective  COVID-19 screening performed prior to patient entering the building. Patient arrives to physical therapy with right shoulder pain and difficulty with movement due to a right total shoulder replacement on 07/02/2018. Patient states she has been performing ADLs independently with increased time. Patient lives with  her son but states she does not ask for assistance due to his own disabilities. Patient reports pain at worst is 10/10 with movement; pain at best 0/10. Patient states she has not been wearing her sling but sometimes uses it at night because she is having difficulties with sleeping. Patient's goals are to decrease pain and to return to normal use of her right arm.    Pertinent History  R total shoulder replacement 07/02/2018, HTN, chronic diastolic heart failure, secondary cardiomyopathy, R THA, dementia and memory loss.    Limitations  Lifting;House hold activities    Patient Stated Goals  use arm normally    Currently in Pain?  Yes    Pain Score  8     Pain Location  Shoulder    Pain Orientation  Right;Anterior    Pain Descriptors / Indicators  Aching;Sore;Tightness    Pain Type  Surgical pain    Pain Onset  1 to 4 weeks ago    Pain Frequency  Intermittent    Aggravating Factors   movement    Pain Relieving Factors  tylenol and resting the arm.    Effect of Pain on Daily Activities  increased time to perform activities due to pain.         [facility] PT Assessment - 07/31/18 0001      Assessment   Medical Diagnosis  Primary Arthritis, Right shouldre    Referring Provider (PT)  Annell Greening, MD    Onset Date/Surgical Date  07/02/18    Hand Dominance  Right    Next MD Visit  n/a    Prior Therapy  no      Precautions   Precautions  Shoulder    Type of Shoulder Precautions  right total shoulder replacement      Balance Screen   Has the patient fallen in the past 6 months  No    Has the patient had a decrease in activity level because of a fear of falling?   No    Is the patient reluctant to leave their home because of a fear of falling?   No      Home Public house manager residence    Living Arrangements  Children      Prior Function   Level of Independence  Independent with basic ADLs    Vocation  Retired      Observation/Other Assessments   Skin Integrity   incision is healing well with posterior scabbing still notable      ROM / Strength   AROM / PROM / Strength  PROM      PROM   PROM Assessment Site  Shoulder    Right/Left Shoulder  Right    Right Shoulder Flexion  104 Degrees    Right Shoulder ABduction  60 Degrees    Right Shoulder Internal Rotation  --   to abdomen   Right Shoulder External Rotation  34 Degrees      Palpation   Palpation comment  noted with palpable scar tissue in inferior aspect; tenderness to palpation to right biceps and deltoid.      Transfers   Transfers  Supine to Sit    Supine to Sit  4: Min assist    Supine to Sit Details (indicate cue type and reason)  assist with supine to sit    Comments  weightbearing through right shoulder with transfers                Objective measurements completed on examination: See above findings.      [facility] Adult PT Treatment/Exercise - 07/31/18 0001      Modalities   Modalities  Electrical Stimulation;Cryotherapy      Cryotherapy   Number Minutes Cryotherapy  10 Minutes    Cryotherapy Location  Shoulder    Type of Cryotherapy  Ice pack      Electrical Stimulation   Electrical Stimulation Location  right shoulder    Electrical Stimulation Action  IFC    Electrical Stimulation Parameters  80-150 hz x10 mins    Electrical Stimulation Goals  Pain             PT Education - 07/31/18 1046    Education Details  pendulums, putty squeezes, scapular retractions, education on protocol for healing.    Person(s) Educated  Patient    Methods  Explanation;Demonstration;Handout    Comprehension  Verbalized understanding;Returned demonstration       PT Short Term Goals - 07/31/18 1246      PT SHORT TERM GOAL #1   Title  STG=LTG        PT Long Term Goals - 07/31/18 1246      PT LONG TERM GOAL #1   Title  Patient will be independent with HEP and progression.    Time  8    Period  Weeks    Status  New      PT LONG TERM GOAL #2   Title  Patient  will demonstrate 120+ degrees of right shoulder flexion AROM to improve ability to perform functional tasks.     Time  8    Period  Weeks    Status  New      PT LONG TERM GOAL #3   Title  Patient will demonstrate 60+ degrees of right shoulder ER AROM to improve ability to don/doff apparel.     Time  8    Period  Weeks    Status  New      PT LONG TERM GOAL #4   Title  Patient will report ability to perform ADLs with right shoulder pain less than 4/10.    Time  8    Period  Weeks    Status  New      PT LONG TERM GOAL #5   Title  Patient will demonstrate 4/5 or greater right shoulder MMT in all planes to improve stability during functional tasks.     Time  8    Period  Weeks    Status  New             Plan - 07/31/18 1240    Clinical Impression Statement  Patient is an 84 year old right handed female who presents to physical therapy with decreased right shoulder PROM and right shoulder pain. Patient is a poor historian and is unable to recall surgery date, precautions, or how follow up visit went with MD on 07/17/2018.  Patient was noted with right weightbearing during transfers. Patient noted with two scabs at the inferior aspect of her scar with increased scar tissue. Patient reported tenderness to palpation to right biceps and deltoid. Patient and PT reviewed HEP as well as importance of following protocol to allow for adequate healing. Patient reported understanding. Patient would benefit from skilled physical therapy to address deficits and address patient's goals.     Personal Factors and Comorbidities  Age;Finances;Comorbidity 3+    Comorbidities  HTN, secondary cardiomyopathy, Chronic diastolic heart failure, dementia    Examination-Activity Limitations  Bathing;Lift;Dressing;Sleep;Carry    Stability/Clinical Decision Making  Stable/Uncomplicated    Clinical Decision Making  Moderate    Rehab Potential  Fair    PT Frequency  2x / week    PT Duration  8 weeks    PT  Treatment/Interventions  ADLs/Self Care Home Management;Electrical Stimulation;Moist Heat;Cryotherapy;Functional mobility training;Neuromuscular re-education;Therapeutic exercise;Therapeutic activities;Patient/family education;Manual techniques;Passive range of motion;Scar mobilization;Vasopneumatic Device    PT Next Visit Plan  Per protocol, AAROM pulleys, seated UE ranger, PROM to right shoulder, modalities for pain relief    PT Home Exercise Plan  See patient education section    Consulted and Agree with Plan of Care  Patient       Patient will benefit from skilled therapeutic intervention in order to improve the following deficits and impairments:  Pain, Postural dysfunction, Decreased activity tolerance, Decreased range of motion, Decreased endurance, Decreased knowledge of precautions, Impaired UE functional use  Visit Diagnosis: Acute pain of right shoulder - Plan: PT plan of care cert/re-cert  Stiffness of right shoulder, not elsewhere classified - Plan: PT plan of care cert/re-cert  Muscle weakness (generalized) - Plan: PT plan of care cert/re-cert     Problem List Patient Active Problem List   Diagnosis Date Noted  . H/O total shoulder replacement, right 07/17/2018  . Shoulder arthritis 07/02/2018  . Dementia without behavioral disturbance (HCC) 05/29/2018  . SOB (shortness of breath) 04/22/2018  . COPD exacerbation (HCC)   . Occipital neuralgia of left side 03/20/2018  . Glenohumeral arthritis, right 02/12/2018  . Tobacco use 12/24/2017  . GOLD COPD 0 with Asthmatic Component    . Biceps tendinosis of right shoulder 08/04/2017  . Pre-operative cardiovascular examination 08/01/2017  . Carotid artery disease (HCC) 07/03/2017  . DOE (dyspnea on exertion) 07/03/2017  . Chronic right shoulder pain 12/27/2016  . Constipation 04/11/2016  . Chronic diastolic heart failure (HCC) 04/07/2016  . Hyperglycemia 04/05/2016  . Syncope 11/15/2015  . Dehydration 10/05/2015  . UTI  (lower urinary tract infection) 10/05/2015  . Mild dementia (HCC) 09/13/2015  . Memory problem 05/07/2015  . Neck pain 04/06/2015  . Insomnia 12/10/2014  . BMI 28.0-28.9,adult 12/10/2014  . Osteopenia 05/19/2014  . GAD (generalized anxiety disorder) 01/06/2014  . Depression 01/06/2014  . GERD (gastroesophageal reflux disease) 01/06/2014  . Vitamin D deficiency 01/06/2014  . Overactive bladder 01/06/2014  . Secondary cardiomyopathy (HCC) 10/06/2013  . Left bundle branch block 10/06/2013  . Dizziness 10/04/2013  . Osteoarthritis of right hip 03/14/2013  . Cough 02/01/2011  . Reflux 02/01/2011  . Allergic rhinitis 12/07/2008  . Chronic cough 12/07/2008  . Hepatitis B virus infection 08/16/2008  . Hyperlipidemia 08/16/2008  . Essential hypertension 08/16/2008  . DISC DEGENERATION 07/24/2007   Guss Bunde, PT, DPT 07/31/2018, 12:53 PM  [facility] [address] [place], [place], [address] Phone: [phone]   Fax:  [phone]  Name: [name] MRN: [id number] Date of  Birth: [birth year]

## 2018-08-01 ENCOUNTER — Telehealth: Payer: Self-pay | Admitting: Family Medicine

## 2018-08-01 ENCOUNTER — Other Ambulatory Visit: Payer: Self-pay | Admitting: Family Medicine

## 2018-08-01 DIAGNOSIS — M109 Gout, unspecified: Secondary | ICD-10-CM

## 2018-08-01 MED ORDER — COLCHICINE 0.6 MG PO TABS: 0.6000 mg | ORAL_TABLET | Freq: Two times a day (BID) | ORAL | 0 refills | Status: DC

## 2018-08-01 NOTE — Telephone Encounter (Signed)
Please tell her to take twice daily until symptoms resolve for a max of 5 days.

## 2018-08-01 NOTE — Telephone Encounter (Signed)
Spoke to [place] ([pt's daughter) and advised [facility] had advised pt to do symptomatic care at home and if symptoms worsened pt would need to come into office for imaging. Pt's daughter stated she needed to go for conference call and asked that I call her sister Fae Pippin who is also on ROI form. Spoke with [name] and she said her mom called her and was complaining that she can barely walk from 1 room to another with the swelling and pain in her ankle. They are requesting gout medication and says she has a history of gout. I don't see it on her problem list but did notice in med history 2015,2016,2017 she did get colchicine. Do you want her to come in for imaging and see another provider or will you send in rx?

## 2018-08-01 NOTE — Telephone Encounter (Signed)
I can send in the RX. I do not want her exposed to anything. She denies injury. I will send meds in. Tell her if this does not help, she will need imaging.

## 2018-08-01 NOTE — Telephone Encounter (Signed)
Patient aware and verbalizes understanding. 

## 2018-08-04 ENCOUNTER — Ambulatory Visit (INDEPENDENT_AMBULATORY_CARE_PROVIDER_SITE_OTHER): Payer: Medicare Other | Admitting: Family Medicine

## 2018-08-04 ENCOUNTER — Encounter: Payer: Self-pay | Admitting: Family Medicine

## 2018-08-04 ENCOUNTER — Ambulatory Visit (INDEPENDENT_AMBULATORY_CARE_PROVIDER_SITE_OTHER): Payer: Medicare Other

## 2018-08-04 ENCOUNTER — Telehealth: Payer: Self-pay | Admitting: Family Medicine

## 2018-08-04 ENCOUNTER — Other Ambulatory Visit: Payer: Self-pay

## 2018-08-04 VITALS — BP 138/72 | HR 79 | Temp 98.8°F | Ht 62.0 in | Wt 162.0 lb

## 2018-08-04 DIAGNOSIS — M25572 Pain in left ankle and joints of left foot: Secondary | ICD-10-CM | POA: Diagnosis not present

## 2018-08-04 DIAGNOSIS — M25472 Effusion, left ankle: Secondary | ICD-10-CM

## 2018-08-04 MED ORDER — FUROSEMIDE 40 MG PO TABS: 40.0000 mg | ORAL_TABLET | Freq: Every day | ORAL | 0 refills | Status: DC

## 2018-08-04 NOTE — Patient Instructions (Addendum)
Increase furosemide to 40 mg daily for the next three days.  Weigh daily. Call the office Thursday with your weights.   Edema  Edema is when you have too much fluid in your body or under your skin. Edema may make your legs, feet, and ankles swell up. Swelling is also common in looser tissues, like around your eyes. This is a common condition. It gets more common as you get older. There are many possible causes of edema. Eating too much salt (sodium) and being on your feet or sitting for a long time can cause edema in your legs, feet, and ankles. Hot weather may make edema worse. Edema is usually painless. Your skin may look swollen or shiny. Follow these instructions at home:  Keep the swollen body part raised (elevated) above the level of your heart when you are sitting or lying down.  Do not sit still or stand for a long time.  Do not wear tight clothes. Do not wear garters on your upper legs.  Exercise your legs. This can help the swelling go down.  Wear elastic bandages or support stockings as told by your doctor.  Eat a low-salt (low-sodium) diet to reduce fluid as told by your doctor.  Depending on the cause of your swelling, you may need to limit how much fluid you drink (fluid restriction).  Take over-the-counter and prescription medicines only as told by your doctor. Contact a doctor if:  Treatment is not working.  You have heart, liver, or kidney disease and have symptoms of edema.  You have sudden and unexplained weight gain. Get help right away if:  You have shortness of breath or chest pain.  You cannot breathe when you lie down.  You have pain, redness, or warmth in the swollen areas.  You have heart, liver, or kidney disease and get edema all of a sudden.  You have a fever and your symptoms get worse all of a sudden. Summary  Edema is when you have too much fluid in your body or under your skin.  Edema may make your legs, feet, and ankles swell up.  Swelling is also common in looser tissues, like around your eyes.  Raise (elevate) the swollen body part above the level of your heart when you are sitting or lying down.  Follow your doctor's instructions about diet and how much fluid you can drink (fluid restriction). This information is not intended to replace advice given to you by your health care provider. Make sure you discuss any questions you have with your health care provider. Document Released: 10/03/2007 Document Revised: 05/04/2016 Document Reviewed: 05/04/2016 Elsevier Interactive Patient Education  2019 [employer].

## 2018-08-04 NOTE — Telephone Encounter (Signed)
Patient was seen 4/2 by [name] for gout in left ankle- per daughter patient states that it is not gotten any better. Patient has been taking Colchicine. Please review and advise

## 2018-08-04 NOTE — Addendum Note (Signed)
Addended by: Sonny Masters on: 08/04/2018 02:15 PM   Modules accepted: Orders

## 2018-08-04 NOTE — Progress Notes (Signed)
Subjective:  Patient ID: [name], female    DOB: 1935/04/12, 84 y.o.   MRN: [id number]  Chief Complaint:  Joint Swelling   HPI: [name] is a 84 y.o. female presenting on 08/04/2018 for Joint Swelling  Pt presents today for ongoing left ankle pain and swelling. Pt states she thought this was gout at first. She has tried colchicine for the symptoms without relief. Pt denies injury. Pt states her ankle feels achy and tight, 3/10 at worst. She denies increased shortness of breath, orthopnea, chest pain, palpitations, dizziness, cough, abdominal distention, or weakness. No confusion or unexplained weight gain.   Relevant past medical, surgical, family, and social history reviewed and updated as indicated.  Allergies and medications reviewed and updated.   Past Medical History:  Diagnosis Date  . Allergic rhinitis   . Anxiety   . Arthritis   . Asthma   . CAP (community acquired pneumonia) 11/26/2017  . Depression   . Dyspnea    with exertion   . Elevated troponin 04/05/2016  . Essential hypertension, benign   . GERD (gastroesophageal reflux disease)   . Headache    occiptal neuralgia - left   . Heart murmur   . History of blood transfusion    "related to miscarriage"  . Left bundle branch block    in & out of A fib  . Memory loss   . Mixed hyperlipidemia   . Occipital neuralgia of left side   . Osteopenia   . 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 Urinary urgency     Past Surgical History:  Procedure Laterality Date  . APPENDECTOMY    . BACK SURGERY    . CARDIAC CATHETERIZATION  2010   Normal coronaries  . CATARACT EXTRACTION W/PHACO  04/16/2011   Procedure: CATARACT EXTRACTION PHACO AND INTRAOCULAR LENS PLACEMENT (IOC);  Surgeon: Williams Che;  Location: [facility];  Service: Ophthalmology;  Laterality: Left;  CDE=11.35  . CATARACT EXTRACTION W/PHACO  01/28/2012   Procedure: CATARACT EXTRACTION PHACO AND INTRAOCULAR LENS  PLACEMENT (IOC);  Surgeon: Williams Che, MD;  Location: [facility];  Service: Ophthalmology;  Laterality: Right;  CDI:8.15  . CYSTO WITH HYDRODISTENSION  05/29/2012   Procedure: CYSTOSCOPY/HYDRODISTENSION;  Surgeon: Reece Packer, MD;  Location: [facility];  Service: Urology;  Laterality: N/A;  INSTILLATION OF MARCAINE AND PYRIDIUM   . DILATION AND CURETTAGE OF UTERUS  <hysterectomy"  . EYE SURGERY    . HAMMER TOE SURGERY Bilateral 11/22/11   [facility] [place]  . JOINT REPLACEMENT    . LAPAROSCOPIC CHOLECYSTECTOMY  1991  . LUMBAR LAMINECTOMY  2011  . LUMBAR LAMINECTOMY/DECOMPRESSION MICRODISCECTOMY Left 06/06/2012   Procedure: LUMBAR LAMINECTOMY/DECOMPRESSION MICRODISCECTOMY 1 LEVEL;  Surgeon: Erline Levine, MD;  Location: [facility];  Service: Neurosurgery;  Laterality: Left;  Left Lumbar two-three Laminectomy for resection of synovial cyst  . SHOULDER ARTHROSCOPY Right 02/12/2018   SHOULDER ARTHROSCOPY, BICEPS DEBRIDEMENT, REMOVAL OF LOOSE BODIES  . SHOULDER ARTHROSCOPY Right 02/12/2018   Procedure: RIGHT SHOULDER ARTHROSCOPY, BICEPS DEBRIDEMENT, REMOVAL OF LOOSE BODIES;  Surgeon: Marybelle Killings, MD;  Location: [facility];  Service: Orthopedics;  Laterality: Right;  . SHOULDER OPEN ROTATOR CUFF REPAIR Right 2011  . TONSILLECTOMY    . TOTAL HIP ARTHROPLASTY Right 03/13/2013   Procedure: TOTAL HIP ARTHROPLASTY;  Surgeon: Kerin Salen, MD;  Location: [facility];  Service: Orthopedics;  Laterality: Right;  . TOTAL SHOULDER ARTHROPLASTY Right  07/02/2018   Procedure: RIGHT TOTAL SHOULDER ARTHROPLASTY;  Surgeon: Marybelle Killings, MD;  Location: [facility];  Service: Orthopedics;  Laterality: Right;  Marland Kitchen VAGINAL HYSTERECTOMY      Social History   Socioeconomic History  . Marital status: Widowed    Spouse name: Not on file  . Number of children: 5  . Years of education: 8th grade  . Highest education level: Not on file  Occupational History  . Occupation: Worked in [employer] representative for 30  years    Employer: RETIRED    Comment: Retired  . Occupation: Part time care giver  Social Needs  . Financial resource strain: Somewhat hard  . Food insecurity:    Worry: Sometimes true    Inability: Never true  . Transportation needs:    Medical: No    Non-medical: No  Tobacco Use  . Smoking status: Light Tobacco Smoker    Years: 60.00    Types: Cigarettes  . Smokeless tobacco: Never Used  Substance and Sexual Activity  . Alcohol use: Not Currently    Comment: 11/27/2017 "drank some in my 20s"  . Drug use: No  . Sexual activity: Not Currently  Lifestyle  . Physical activity:    Days per week: 0 days    Minutes per session: 0 min  . Stress: Only a little  Relationships  . Social connections:    Talks on phone: More than three times a week    Gets together: More than three times a week    Attends religious service: More than 4 times per year    Active member of club or organization: Yes    Attends meetings of clubs or organizations: More than 4 times per year    Relationship status: Widowed  . Intimate partner violence:    Fear of current or ex partner: No    Emotionally abused: No    Physically abused: No    Forced sexual activity: No  Other Topics Concern  . Not on file  Social History Narrative   Ms Grenz is divorced. Her adult son lives with her and he is disabled due to bilateral AKA due to diabetes.  She helps to provide care to him. She has three adult daughters that live in the [place] area. She talks with them daily and they come to visit once or twice a year. She has 7 grandchildren also.     Outpatient Encounter Medications as of 08/04/2018  Medication Sig  . albuterol (PROVENTIL HFA;VENTOLIN HFA) 108 (90 Base) MCG/ACT inhaler Inhale 1-2 puffs into the lungs every 6 (six) hours as needed for wheezing or shortness of breath.  Marland Kitchen aspirin EC 81 MG tablet Take 81 mg by mouth every evening.   Marland Kitchen azelastine (ASTELIN) 0.1 % nasal spray Place 2 sprays into both nostrils 2  (two) times daily. Use in each nostril as directed  . Biotin 5 MG TABS Take 5 mg by mouth every morning.   . budesonide-formoterol (SYMBICORT) 160-4.5 MCG/ACT inhaler Inhale 2 puffs into the lungs 2 (two) times daily. (Patient taking differently: Inhale 2 puffs into the lungs 2 (two) times daily as needed (shortness of breath). )  . buPROPion (WELLBUTRIN SR) 150 MG 12 hr tablet TAKE (1) TABLET TWICE A DAY. (Patient taking differently: Take 150 mg by mouth 2 (two) times daily. )  . carvedilol (COREG) 6.25 MG tablet TAKE  (1)  TABLET TWICE A DAY WITH MEALS (BREAKFAST AND SUPPER) (Patient taking differently: Take 6.25 mg by  mouth 2 (two) times daily with a meal. )  . colchicine 0.6 MG tablet Take 1 tablet (0.6 mg total) by mouth 2 (two) times daily for 5 days.  Marland Kitchen esomeprazole (NEXIUM) 40 MG capsule Take 1 capsule (40 mg total) by mouth daily before breakfast.  . furosemide (LASIX) 20 MG tablet TAKE 2 TABLETS DAILY FOR 2 DAYS, THEN DECREASE BACK TO 1 DAILY  . gabapentin (NEURONTIN) 400 MG capsule TAKE (1) CAPSULE THREE TIMES DAILY.  Marland Kitchen HYDROcodone-acetaminophen (NORCO) 7.5-325 MG tablet Take 1 tablet by mouth every 6 (six) hours as needed for moderate pain. post op pain  . loratadine (CLARITIN) 10 MG tablet Take 1 tablet (10 mg total) by mouth daily. (Patient taking differently: Take 10 mg by mouth every morning. )  . losartan (COZAAR) 50 MG tablet Take 1/2 tablet (25 mg total) by mouth daily. (Patient taking differently: Take 25 mg by mouth every morning. )  . memantine (NAMENDA) 5 MG tablet Take 1 tablet (5 mg total) by mouth 2 (two) times daily.  . Multiple Vitamins-Minerals (CENTRUM SILVER PO) Take 1 tablet by mouth every morning.   . ondansetron (ZOFRAN) 4 MG tablet Take 1 tablet (4 mg total) by mouth every 8 (eight) hours as needed for nausea or vomiting.  . potassium chloride SA (K-DUR,KLOR-CON) 20 MEQ tablet Take 1 tablet (20 mEq total) by mouth daily. (Patient taking differently: Take 20 mEq by  mouth every morning. )  . pravastatin (PRAVACHOL) 80 MG tablet Take 1 tablet (80 mg total) by mouth daily. (Patient taking differently: Take 80 mg by mouth at bedtime. )  . raloxifene (EVISTA) 60 MG tablet TAKE 1 TABLET DAILY (Patient taking differently: Take 60 mg by mouth daily. )  . Spacer/Aero-Holding Chambers (AEROCHAMBER MV) inhaler Use as instructed  . vitamin B-12 (CYANOCOBALAMIN) 1000 MCG tablet Take 1,000 mcg by mouth daily.   No facility-administered encounter medications on file as of 08/04/2018.     Allergies  Allergen Reactions  . Lisinopril-Hydrochlorothiazide Swelling, Other (See Comments) and Cough    Lip swelling and cough. Stopped by ENT. Patient is on Losartan without any issues   . Codeine Itching and Swelling    eye irritation (prescribed percocet)  . Penicillins Itching and Swelling    PATIENT HAS HAD A PCN REACTION WITH IMMEDIATE RASH, FACIAL/TONGUE/THROAT SWELLING, SOB, OR LIGHTHEADEDNESS WITH HYPOTENSION:  #  #  YES  #  #  Has patient had a PCN reaction causing severe rash involving mucus membranes or skin necrosis: No Has patient had a PCN reaction that required hospitalization No Has patient had a PCN reaction occurring within the last 10 years: No eye irritation  . Aricept [Donepezil Hcl] Nausea And Vomiting  . Namenda [Memantine Hcl] Nausea And Vomiting    Review of Systems  Constitutional: Negative for chills, fatigue and fever.  Respiratory: Positive for shortness of breath (exertional, no more than normal). Negative for cough, choking, chest tightness and wheezing.   Cardiovascular: Positive for leg swelling. Negative for chest pain and palpitations.  Gastrointestinal: Negative for abdominal distention.  Genitourinary: Negative for decreased urine volume and difficulty urinating.  Musculoskeletal: Positive for arthralgias (left ankle) and joint swelling (left ankle).  Neurological: Negative for dizziness, syncope, weakness, light-headedness, numbness and  headaches.  Hematological: Does not bruise/bleed easily.  Psychiatric/Behavioral: Negative for confusion.  All other systems reviewed and are negative.       Objective:  BP 138/72   Pulse 79   Temp 98.8 F (37.1 C) (Oral)  Ht 5' 2"  (1.575 m)   Wt 162 lb (73.5 kg)   BMI 29.63 kg/m    Wt Readings from Last 3 Encounters:  08/04/18 162 lb (73.5 kg)  07/17/18 161 lb (73 kg)  07/02/18 161 lb (73 kg)    Physical Exam Vitals signs and nursing note reviewed.  Constitutional:      General: She is not in acute distress.    Appearance: Normal appearance. She is not ill-appearing or toxic-appearing.  HENT:     Head: Normocephalic and atraumatic.  Eyes:     General: Lids are normal.     Extraocular Movements: Extraocular movements intact.     Conjunctiva/sclera: Conjunctivae normal.     Pupils: Pupils are equal, round, and reactive to light.  Neck:     Musculoskeletal: Normal range of motion and neck supple.     Vascular: No carotid bruit or JVD.     Trachea: Trachea and phonation normal.  Cardiovascular:     Rate and Rhythm: Normal rate and regular rhythm.     Chest Wall: PMI is not displaced.     Pulses: Normal pulses.          Dorsalis pedis pulses are 2+ on the right side and 2+ on the left side.       Posterior tibial pulses are 2+ on the right side and 2+ on the left side.     Heart sounds: Heart sounds not distant. No murmur. No gallop. No S3 sounds.   Pulmonary:     Effort: Pulmonary effort is normal.     Breath sounds: Examination of the right-lower field reveals rhonchi. Examination of the left-lower field reveals rhonchi. Rhonchi (mild) present. No rales.  Musculoskeletal:     Left knee: Normal.     Left ankle: She exhibits decreased range of motion and swelling. She exhibits no ecchymosis, no deformity, no laceration and normal pulse. Tenderness. Achilles tendon exhibits no pain, no defect and normal Thompson's test results.     Right lower leg: 1+ Edema present.      Left lower leg: 2+ Edema present.       Feet:  Feet:     Right foot:     Skin integrity: Skin integrity normal.     Left foot:     Skin integrity: Skin integrity normal.  Skin:    General: Skin is warm and dry.     Capillary Refill: Capillary refill takes less than 2 seconds.  Neurological:     General: No focal deficit present.     Mental Status: She is alert and oriented to person, place, and time.  Psychiatric:        Mood and Affect: Mood normal.        Behavior: Behavior normal. Behavior is cooperative.        Thought Content: Thought content normal.        Judgment: Judgment normal.     Results for orders placed or performed during the hospital encounter of 06/26/18  Surgical pcr screen  Result Value Ref Range   MRSA, PCR NEGATIVE NEGATIVE   Staphylococcus aureus POSITIVE (A) NEGATIVE  CBC  Result Value Ref Range   WBC 8.2 4.0 - 10.5 K/uL   RBC 4.64 3.87 - 5.11 MIL/uL   Hemoglobin 14.1 12.0 - 15.0 g/dL   HCT 43.4 36.0 - 46.0 %   MCV 93.5 80.0 - 100.0 fL   MCH 30.4 26.0 - 34.0 pg   MCHC 32.5 30.0 - 36.0  g/dL   RDW 14.6 11.5 - 15.5 %   Platelets 324 150 - 400 K/uL   nRBC 0.0 0.0 - 0.2 %  Basic metabolic panel  Result Value Ref Range   Sodium 142 135 - 145 mmol/L   Potassium 4.3 3.5 - 5.1 mmol/L   Chloride 106 98 - 111 mmol/L   CO2 28 22 - 32 mmol/L   Glucose, Bld 96 70 - 99 mg/dL   BUN 16 8 - 23 mg/dL   Creatinine, Ser 1.24 (H) 0.44 - 1.00 mg/dL   Calcium 9.3 8.9 - 10.3 mg/dL   GFR calc non Af Amer 41 (L) >60 mL/min   GFR calc Af Amer 47 (L) >60 mL/min   Anion gap 8 5 - 15     X-Ray: left ankle: No acute findings. Preliminary x-ray reading by Monia Pouch, FNP-C, [facility].   Pertinent labs & imaging results that were available during my care of the patient were reviewed by me and considered in my medical decision making.  Assessment & Plan:  Scarlette was seen today for joint swelling.  Diagnoses and all orders for this visit:  Acute left ankle pain  No acute findings on imaging. Will notify if radiology reading differs.  -     DG Ankle Complete Left; Future  Left ankle swelling Elevate feet as much as possible. Avoid excessive sodium in your diet. Increase lasix to 40 mg for the next three days. Weight daily and call office with weights on Thursday. Report any new or worsening symptoms. Labs pending.  -     DG Ankle Complete Left; Future -     Brain natriuretic peptide -     CMP14+EGFR     Continue all other maintenance medications.  Follow up plan: Return in about 2 weeks (around 08/18/2018), or if symptoms worsen or fail to improve.  Educational handout given for edema   The above assessment and management plan was discussed with the patient. The patient verbalized understanding of and has agreed to the management plan. Patient is aware to call the clinic if symptoms persist or worsen. Patient is aware when to return to the clinic for a follow-up visit. Patient educated on when it is appropriate to go to the emergency department.   Monia Pouch, FNP-C [facility] [phone]

## 2018-08-04 NOTE — Telephone Encounter (Signed)
Apt scheduled with [name] for today.

## 2018-08-04 NOTE — Telephone Encounter (Signed)
PT daughter [name] is calling wanting to speak to nurse states that mom was seen by [name] on Friday and the gout medication that was sent in doesn't seem to be helping.

## 2018-08-04 NOTE — Telephone Encounter (Signed)
Is she continues to have pain, she will need imaging.

## 2018-08-05 ENCOUNTER — Ambulatory Visit: Payer: Medicare Other | Admitting: Physical Therapy

## 2018-08-05 ENCOUNTER — Encounter: Payer: Self-pay | Admitting: Physical Therapy

## 2018-08-05 DIAGNOSIS — M25511 Pain in right shoulder: Secondary | ICD-10-CM | POA: Diagnosis not present

## 2018-08-05 DIAGNOSIS — M25611 Stiffness of right shoulder, not elsewhere classified: Secondary | ICD-10-CM

## 2018-08-05 LAB — CMP14+EGFR
ALT: 16 IU/L (ref 0–32)
AST: 21 IU/L (ref 0–40)
Albumin/Globulin Ratio: 1.4 (ref 1.2–2.2)
Albumin: 3.9 g/dL (ref 3.6–4.6)
Alkaline Phosphatase: 152 IU/L — ABNORMAL HIGH (ref 39–117)
BUN/Creatinine Ratio: 18 (ref 12–28)
BUN: 19 mg/dL (ref 8–27)
Bilirubin Total: <0.2 mg/dL (ref 0.0–1.2)
CO2: 23 mmol/L (ref 20–29)
Calcium: 9.5 mg/dL (ref 8.7–10.3)
Chloride: 106 mmol/L (ref 96–106)
Creatinine, Ser: 1.05 mg/dL — ABNORMAL HIGH (ref 0.57–1.00)
GFR calc Af Amer: 58 mL/min/{1.73_m2} — ABNORMAL LOW (ref >59)
GFR calc non Af Amer: 50 mL/min/{1.73_m2} — ABNORMAL LOW (ref >59)
Globulin, Total: 2.8 g/dL (ref 1.5–4.5)
Glucose: 103 mg/dL — ABNORMAL HIGH (ref 65–99)
Potassium: 4.2 mmol/L (ref 3.5–5.2)
Sodium: 144 mmol/L (ref 134–144)
Total Protein: 6.7 g/dL (ref 6.0–8.5)

## 2018-08-05 LAB — BRAIN NATRIURETIC PEPTIDE: BNP: 191.2 pg/mL — ABNORMAL HIGH (ref 0.0–100.0)

## 2018-08-05 LAB — URIC ACID: Uric Acid: 5.4 mg/dL (ref 2.5–7.1)

## 2018-08-05 NOTE — Therapy (Signed)
[facility] [address] [place], [place], [address] Phone: [phone]   Fax:  [phone]  Physical Therapy Treatment  Patient Details  Name: [name] MRN: [id number] Date of Birth: [birth year] Referring Provider (PT): Annell GreeningMark Yates, MD   Encounter Date: 08/05/2018  PT End of Session - 08/05/18 1035    Visit Number  2    Number of Visits  16    Date for PT Re-Evaluation  10/02/18    PT Start Time  0951    PT Stop Time  1043    PT Time Calculation (min)  52 min    Activity Tolerance  Patient tolerated treatment well    Behavior During Therapy  [facility] for tasks assessed/performed       Past Medical History:  Diagnosis Date  . Allergic rhinitis   . Anxiety   . Arthritis   . Asthma   . CAP (community acquired pneumonia) 11/26/2017  . Depression   . Dyspnea    with exertion   . Elevated troponin 04/05/2016  . Essential hypertension, benign   . GERD (gastroesophageal reflux disease)   . Headache    occiptal neuralgia - left   . Heart murmur   . History of blood transfusion    "related to miscarriage"  . Left bundle branch block    in & out of A fib  . Memory loss   . Mixed hyperlipidemia   . Occipital neuralgia of left side   . Osteopenia   . 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 Urinary urgency     Past Surgical History:  Procedure Laterality Date  . APPENDECTOMY    . BACK SURGERY    . CARDIAC CATHETERIZATION  2010   Normal coronaries  . CATARACT EXTRACTION W/PHACO  04/16/2011   Procedure: CATARACT EXTRACTION PHACO AND INTRAOCULAR LENS PLACEMENT (IOC);  Surgeon: Susa Simmondsarroll F Haines;  Location: [facility];  Service: Ophthalmology;  Laterality: Left;  CDE=11.35  . CATARACT EXTRACTION W/PHACO  01/28/2012   Procedure: CATARACT EXTRACTION PHACO AND INTRAOCULAR LENS PLACEMENT (IOC);  Surgeon: Susa Simmondsarroll F Haines, MD;  Location: [facility];  Service: Ophthalmology;  Laterality: Right;  CDI:8.15  . CYSTO  WITH HYDRODISTENSION  05/29/2012   Procedure: CYSTOSCOPY/HYDRODISTENSION;  Surgeon: Martina SinnerScott A MacDiarmid, MD;  [facility];  Service: Urology;  Laterality: N/A;  INSTILLATION OF MARCAINE AND PYRIDIUM   . DILATION AND CURETTAGE OF UTERUS  <hysterectomy"  . EYE SURGERY    . HAMMER TOE SURGERY Bilateral 11/22/11   [facility], Ulice [facility]  . JOINT REPLACEMENT    . LAPAROSCOPIC CHOLECYSTECTOMY  1991  . LUMBAR LAMINECTOMY  2011  . LUMBAR LAMINECTOMY/DECOMPRESSION MICRODISCECTOMY Left 06/06/2012   Procedure: LUMBAR LAMINECTOMY/DECOMPRESSION MICRODISCECTOMY 1 LEVEL;  Surgeon: Maeola HarmanJoseph Stern, MD;  Location: [facility];  Service: Neurosurgery;  Laterality: Left;  Left Lumbar two-three Laminectomy for resection of synovial cyst  . SHOULDER ARTHROSCOPY Right 02/12/2018   SHOULDER ARTHROSCOPY, BICEPS DEBRIDEMENT, REMOVAL OF LOOSE BODIES  . SHOULDER ARTHROSCOPY Right 02/12/2018   Procedure: RIGHT SHOULDER ARTHROSCOPY, BICEPS DEBRIDEMENT, REMOVAL OF LOOSE BODIES;  Surgeon: Eldred MangesYates, Mark C, MD;  Location: [facility];  Service: Orthopedics;  Laterality: Right;  . SHOULDER OPEN ROTATOR CUFF REPAIR Right 2011  . TONSILLECTOMY    . TOTAL HIP ARTHROPLASTY Right 03/13/2013   Procedure: TOTAL HIP ARTHROPLASTY;  Surgeon: Nestor LewandowskyFrank J Rowan, MD;  Location: [facility];  Service: Orthopedics;  Laterality: Right;  . TOTAL SHOULDER ARTHROPLASTY Right 07/02/2018  Procedure: RIGHT TOTAL SHOULDER ARTHROPLASTY;  Surgeon: Eldred MangesYates, Mark C, MD;  [facility];  Service: Orthopedics;  Laterality: Right;  Marland Kitchen. VAGINAL HYSTERECTOMY      There were no vitals filed for this visit.  Subjective Assessment - 08/05/18 1037    Subjective  COVID-19  screen performed prior to patient entering clinic.  Patient has no new complaints today.    Pertinent History  R total shoulder replacement 07/02/2018, HTN, chronic diastolic heart failure, secondary cardiomyopathy, R THA, dementia and memory loss.    Limitations  Lifting;House hold activities    Patient  Stated Goals  use arm normally    Currently in Pain?  Yes    Pain Score  7     Pain Location  Shoulder    Pain Orientation  Right;Anterior    Pain Descriptors / Indicators  Aching;Sore;Tightness    Pain Type  Surgical pain    Pain Onset  1 to 4 weeks ago                       [facility]/Exercise - 08/05/18 0001      Modalities   Modalities  Electrical Stimulation;Vasopneumatic      Electrical Stimulation   Electrical Stimulation Location  Right shoulder.      Vasopneumatic   Number Minutes Vasopneumatic   20 minutes    Vasopnuematic Location   --   Right shoulder.   Vasopneumatic Pressure  Low   Pillow between right elbow and thorax.     Manual Therapy   Manual Therapy  Passive ROM    Passive ROM  In supine:  Gentle PROM into right shoulder flexion and ER x 23 minutes.               PT Short Term Goals - 07/31/18 1246      PT SHORT TERM GOAL #1   Title  STG=LTG        PT Long Term Goals - 07/31/18 1246      PT LONG TERM GOAL #1   Title  Patient will be independent with HEP and progression.    Time  8    Period  Weeks    Status  New      PT LONG TERM GOAL #2   Title  Patient will demonstrate 120+ degrees of right shoulder flexion AROM to improve ability to perform functional tasks.     Time  8    Period  Weeks    Status  New      PT LONG TERM GOAL #3   Title  Patient will demonstrate 60+ degrees of right shoulder ER AROM to improve ability to don/doff apparel.     Time  8    Period  Weeks    Status  New      PT LONG TERM GOAL #4   Title  Patient will report ability to perform ADLs with right shoulder pain less than 4/10.    Time  8    Period  Weeks    Status  New      PT LONG TERM GOAL #5   Title  Patient will demonstrate 4/5 or greater right shoulder MMT in all planes to improve stability during functional tasks.     Time  8    Period  Weeks    Status  New            Plan - 08/05/18 1057  Clinical  Impression Statement  Patient did very well with passive range of motion to her right shoulder per protocol.  Range of motion improved since evaluated.      Personal Factors and Comorbidities  Age;Finances;Comorbidity 3+    Comorbidities  HTN, secondary cardiomyopathy, Chronic diastolic heart failure, dementia    Examination-Activity Limitations  Bathing;Lift;Dressing;Sleep;Carry    Rehab Potential  Fair    PT Frequency  2x / week    PT Duration  8 weeks    PT Treatment/Interventions  ADLs/Self Care Home Management;Electrical Stimulation;Moist Heat;Cryotherapy;Functional mobility training;Neuromuscular re-education;Therapeutic exercise;Therapeutic activities;Patient/family education;Manual techniques;Passive range of motion;Scar mobilization;Vasopneumatic Device    PT Next Visit Plan  Per protocol, AAROM pulleys, seated UE ranger, PROM to right shoulder, modalities for pain relief    PT Home Exercise Plan  See patient education section    Consulted and Agree with Plan of Care  Patient       Patient will benefit from skilled therapeutic intervention in order to improve the following deficits and impairments:  Pain, Postural dysfunction, Decreased activity tolerance, Decreased range of motion, Decreased endurance, Decreased knowledge of precautions, Impaired UE functional use  Visit Diagnosis: Acute pain of right shoulder  Stiffness of right shoulder, not elsewhere classified     Problem List Patient Active Problem List   Diagnosis Date Noted  . H/O total shoulder replacement, right 07/17/2018  . Shoulder arthritis 07/02/2018  . Dementia without behavioral disturbance (HCC) 05/29/2018  . SOB (shortness of breath) 04/22/2018  . COPD exacerbation (HCC)   . Occipital neuralgia of left side 03/20/2018  . Glenohumeral arthritis, right 02/12/2018  . Tobacco use 12/24/2017  . GOLD COPD 0 with Asthmatic Component    . Biceps tendinosis of right shoulder 08/04/2017  . Pre-operative  cardiovascular examination 08/01/2017  . Carotid artery disease (HCC) 07/03/2017  . DOE (dyspnea on exertion) 07/03/2017  . Chronic right shoulder pain 12/27/2016  . Constipation 04/11/2016  . Chronic diastolic heart failure (HCC) 04/07/2016  . Hyperglycemia 04/05/2016  . Syncope 11/15/2015  . Dehydration 10/05/2015  . UTI (lower urinary tract infection) 10/05/2015  . Mild dementia (HCC) 09/13/2015  . Memory problem 05/07/2015  . Neck pain 04/06/2015  . Insomnia 12/10/2014  . BMI 28.0-28.9,adult 12/10/2014  . Osteopenia 05/19/2014  . GAD (generalized anxiety disorder) 01/06/2014  . Depression 01/06/2014  . GERD (gastroesophageal reflux disease) 01/06/2014  . Vitamin D deficiency 01/06/2014  . Overactive bladder 01/06/2014  . Secondary cardiomyopathy (HCC) 10/06/2013  . Left bundle branch block 10/06/2013  . Dizziness 10/04/2013  . Osteoarthritis of right hip 03/14/2013  . Cough 02/01/2011  . Reflux 02/01/2011  . Allergic rhinitis 12/07/2008  . Chronic cough 12/07/2008  . Hepatitis B virus infection 08/16/2008  . Hyperlipidemia 08/16/2008  . Essential hypertension 08/16/2008  . DISC DEGENERATION 07/24/2007    [name], [place] MPT 08/05/2018, 11:01 AM  [facility] [address] [place], [place], [address] Phone: [phone]   Fax:  [phone]  Name: [name] MRN: [id number] Date of Birth: 10-11-1934

## 2018-08-07 ENCOUNTER — Other Ambulatory Visit: Payer: Self-pay

## 2018-08-07 ENCOUNTER — Encounter: Payer: Self-pay | Admitting: Family Medicine

## 2018-08-07 ENCOUNTER — Ambulatory Visit (INDEPENDENT_AMBULATORY_CARE_PROVIDER_SITE_OTHER): Payer: Medicare Other | Admitting: Family Medicine

## 2018-08-07 DIAGNOSIS — M25472 Effusion, left ankle: Secondary | ICD-10-CM

## 2018-08-07 MED ORDER — FUROSEMIDE 40 MG PO TABS: 40.0000 mg | ORAL_TABLET | Freq: Every day | ORAL | 0 refills | Status: DC

## 2018-08-07 NOTE — Progress Notes (Signed)
Virtual Visit via telephone Note Due to COVID-19, visit is conducted virtually and was requested by patient.  I connected with [name] on 08/07/18 at 1405 by telephone and verified that I am speaking with the correct person using two identifiers. [name] is currently located at home and family is currently with them during visit. The provider, Kari Baars, FNP is located in their office at time of visit.  I discussed the limitations, risks, security and privacy concerns of performing an evaluation and management service by telephone and the availability of in person appointments. I also discussed with the patient that there may be a patient responsible charge related to this service. The patient expressed understanding and agreed to proceed.  Subjective:  Patient ID: [name], female    DOB: Mar 19, 1935, 84 y.o.   MRN: [id number]  Chief Complaint:  Leg Swelling   HPI: [name] is a 84 y.o. female presenting on 08/07/2018 for Leg Swelling   Pt reports the swelling in her ankle has decreased some but has not completely resolved. Pt has been weighing at home over the last 3 days: 162, 151, 155. Pt states she has been taking the 40 mg lasix as prescribed. States she took the last dose today. She denies chest pain, increased shortness of breath, orthopnea, palpitations, or dizziness. No other associated symptoms.    Relevant past medical, surgical, family, and social history reviewed and updated as indicated.  Allergies and medications reviewed and updated.   Past Medical History:  Diagnosis Date  . Allergic rhinitis   . Anxiety   . Arthritis   . Asthma   . CAP (community acquired pneumonia) 11/26/2017  . Depression   . Dyspnea    with exertion   . Elevated troponin 04/05/2016  . Essential hypertension, benign   . GERD (gastroesophageal reflux disease)   . Headache    occiptal neuralgia - left   . Heart murmur   . History of blood transfusion    "related to miscarriage"  . Left bundle branch block    in & out of A fib  . Memory loss   . Mixed hyperlipidemia   . Occipital neuralgia of left side   . Osteopenia   . 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 Urinary urgency     Past Surgical History:  Procedure Laterality Date  . APPENDECTOMY    . BACK SURGERY    . CARDIAC CATHETERIZATION  2010   Normal coronaries  . CATARACT EXTRACTION W/PHACO  04/16/2011   Procedure: CATARACT EXTRACTION PHACO AND INTRAOCULAR LENS PLACEMENT (IOC);  Surgeon: Susa Simmonds;  Location: [facility];  Service: Ophthalmology;  Laterality: Left;  CDE=11.35  . CATARACT EXTRACTION W/PHACO  01/28/2012   Procedure: CATARACT EXTRACTION PHACO AND INTRAOCULAR LENS PLACEMENT (IOC);  Surgeon: Susa Simmonds, MD;  Location: [facility];  Service: Ophthalmology;  Laterality: Right;  CDI:8.15  . CYSTO WITH HYDRODISTENSION  05/29/2012   Procedure: CYSTOSCOPY/HYDRODISTENSION;  Surgeon: Martina Sinner, MD;  Location: [facility];  Service: Urology;  Laterality: N/A;  INSTILLATION OF MARCAINE AND PYRIDIUM   . DILATION AND CURETTAGE OF UTERUS  <hysterectomy"  . EYE SURGERY    . HAMMER TOE SURGERY Bilateral 11/22/11   [facility], [name]  . JOINT REPLACEMENT    . LAPAROSCOPIC CHOLECYSTECTOMY  1991  . LUMBAR LAMINECTOMY  2011  . LUMBAR LAMINECTOMY/DECOMPRESSION MICRODISCECTOMY Left 06/06/2012   Procedure: LUMBAR LAMINECTOMY/DECOMPRESSION MICRODISCECTOMY 1 LEVEL;  Surgeon: Maeola HarmanJoseph Stern, MD;  Location: [facility];  Service: Neurosurgery;  Laterality: Left;  Left Lumbar two-three Laminectomy for resection of synovial cyst  . SHOULDER ARTHROSCOPY Right 02/12/2018   SHOULDER ARTHROSCOPY, BICEPS DEBRIDEMENT, REMOVAL OF LOOSE BODIES  . SHOULDER ARTHROSCOPY Right 02/12/2018   Procedure: RIGHT SHOULDER ARTHROSCOPY, BICEPS DEBRIDEMENT, REMOVAL OF LOOSE BODIES;  Surgeon: Eldred MangesYates, Mark C, MD;  Location: [facility];  Service: Orthopedics;  Laterality: Right;   . SHOULDER OPEN ROTATOR CUFF REPAIR Right 2011  . TONSILLECTOMY    . TOTAL HIP ARTHROPLASTY Right 03/13/2013   Procedure: TOTAL HIP ARTHROPLASTY;  Surgeon: Nestor LewandowskyFrank J Rowan, MD;  Location: [facility];  Service: Orthopedics;  Laterality: Right;  . TOTAL SHOULDER ARTHROPLASTY Right 07/02/2018   Procedure: RIGHT TOTAL SHOULDER ARTHROPLASTY;  Surgeon: Eldred MangesYates, Mark C, MD;  Location: [facility];  Service: Orthopedics;  Laterality: Right;  Marland Kitchen. VAGINAL HYSTERECTOMY      Social History   Socioeconomic History  . Marital status: Widowed    Spouse name: Not on file  . Number of children: 5  . Years of education: 8th grade  . Highest education level: Not on file  Occupational History  . 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 Occupation: Part time care giver  Social Needs  . Financial resource strain: Somewhat hard  . Food insecurity:    Worry: Sometimes true    Inability: Never true  . Transportation needs:    Medical: No    Non-medical: No  Tobacco Use  . Smoking status: Light Tobacco Smoker    Years: 60.00    Types: Cigarettes  . Smokeless tobacco: Never Used  Substance and Sexual Activity  . Alcohol use: Not Currently    Comment: 11/27/2017 "drank some in my 20s"  . Drug use: No  . Sexual activity: Not Currently  Lifestyle  . Physical activity:    Days per week: 0 days    Minutes per session: 0 min  . Stress: Only a little  Relationships  . Social connections:    Talks on phone: More than three times a week    Gets together: More than three times a week    Attends religious service: More than 4 times per year    Active member of club or organization: Yes    Attends meetings of clubs or organizations: More than 4 times per year    Relationship status: Widowed  . Intimate partner violence:    Fear of current or ex partner: No    Emotionally abused: No    Physically abused: No    Forced sexual activity: No  Other Topics  Concern  . Not on file  Social History Narrative   [name] is divorced. Her adult son lives with her and he is disabled due to bilateral AKA due to diabetes.  She helps to provide care to him. She has three adult daughters that live in the [place] area. She talks with them daily and they come to visit once or twice a year. She has 7 grandchildren also.     Outpatient Encounter Medications as of 08/07/2018  Medication Sig  . albuterol (PROVENTIL HFA;VENTOLIN HFA) 108 (90 Base) MCG/ACT inhaler Inhale 1-2 puffs into the lungs every 6 (six) hours as needed for wheezing or shortness of breath.  Marland Kitchen. aspirin EC 81 MG tablet Take 81 mg by mouth every evening.   Marland Kitchen. azelastine (ASTELIN) 0.1 %  nasal spray Place 2 sprays into both nostrils 2 (two) times daily. Use in each nostril as directed  . Biotin 5 MG TABS Take 5 mg by mouth every morning.   . budesonide-formoterol (SYMBICORT) 160-4.5 MCG/ACT inhaler Inhale 2 puffs into the lungs 2 (two) times daily. (Patient taking differently: Inhale 2 puffs into the lungs 2 (two) times daily as needed (shortness of breath). )  . buPROPion (WELLBUTRIN SR) 150 MG 12 hr tablet TAKE (1) TABLET TWICE A DAY. (Patient taking differently: Take 150 mg by mouth 2 (two) times daily. )  . carvedilol (COREG) 6.25 MG tablet TAKE  (1)  TABLET TWICE A DAY WITH MEALS (BREAKFAST AND SUPPER) (Patient taking differently: Take 6.25 mg by mouth 2 (two) times daily with a meal. )  . colchicine 0.6 MG tablet Take 1 tablet (0.6 mg total) by mouth 2 (two) times daily for 5 days.  Marland Kitchen esomeprazole (NEXIUM) 40 MG capsule Take 1 capsule (40 mg total) by mouth daily before breakfast.  . furosemide (LASIX) 20 MG tablet TAKE 2 TABLETS DAILY FOR 2 DAYS, THEN DECREASE BACK TO 1 DAILY  . furosemide (LASIX) 40 MG tablet Take 1 tablet (40 mg total) by mouth daily for 5 days.  Marland Kitchen gabapentin (NEURONTIN) 400 MG capsule TAKE (1) CAPSULE THREE TIMES DAILY.  Marland Kitchen HYDROcodone-acetaminophen (NORCO) 7.5-325 MG tablet Take 1  tablet by mouth every 6 (six) hours as needed for moderate pain. post op pain  . loratadine (CLARITIN) 10 MG tablet Take 1 tablet (10 mg total) by mouth daily. (Patient taking differently: Take 10 mg by mouth every morning. )  . losartan (COZAAR) 50 MG tablet Take 1/2 tablet (25 mg total) by mouth daily. (Patient taking differently: Take 25 mg by mouth every morning. )  . memantine (NAMENDA) 5 MG tablet Take 1 tablet (5 mg total) by mouth 2 (two) times daily.  . Multiple Vitamins-Minerals (CENTRUM SILVER PO) Take 1 tablet by mouth every morning.   . ondansetron (ZOFRAN) 4 MG tablet Take 1 tablet (4 mg total) by mouth every 8 (eight) hours as needed for nausea or vomiting.  . potassium chloride SA (K-DUR,KLOR-CON) 20 MEQ tablet Take 1 tablet (20 mEq total) by mouth daily. (Patient taking differently: Take 20 mEq by mouth every morning. )  . pravastatin (PRAVACHOL) 80 MG tablet Take 1 tablet (80 mg total) by mouth daily. (Patient taking differently: Take 80 mg by mouth at bedtime. )  . raloxifene (EVISTA) 60 MG tablet TAKE 1 TABLET DAILY (Patient taking differently: Take 60 mg by mouth daily. )  . Spacer/Aero-Holding Chambers (AEROCHAMBER MV) inhaler Use as instructed  . vitamin B-12 (CYANOCOBALAMIN) 1000 MCG tablet Take 1,000 mcg by mouth daily.  . [DISCONTINUED] furosemide (LASIX) 40 MG tablet Take 1 tablet (40 mg total) by mouth daily for 3 doses.   No facility-administered encounter medications on file as of 08/07/2018.     Allergies  Allergen Reactions  . Lisinopril-Hydrochlorothiazide Swelling, Other (See Comments) and Cough    Lip swelling and cough. Stopped by ENT. Patient is on Losartan without any issues   . Codeine Itching and Swelling    eye irritation (prescribed percocet)  . Penicillins Itching and Swelling    PATIENT HAS HAD A PCN REACTION WITH IMMEDIATE RASH, FACIAL/TONGUE/THROAT SWELLING, SOB, OR LIGHTHEADEDNESS WITH HYPOTENSION:  #  #  YES  #  #  Has patient had a PCN reaction  causing severe rash involving mucus membranes or skin necrosis: No Has patient had a PCN  reaction that required hospitalization No Has patient had a PCN reaction occurring within the last 10 years: No eye irritation  . Aricept [Donepezil Hcl] Nausea And Vomiting  . Namenda [Memantine Hcl] Nausea And Vomiting    Review of Systems  Constitutional: Negative for chills, fatigue and fever.  Respiratory: Negative for cough and shortness of breath.   Cardiovascular: Positive for leg swelling. Negative for chest pain and palpitations.  Gastrointestinal: Negative for abdominal distention.  Musculoskeletal: Positive for joint swelling.  Neurological: Negative for dizziness, tremors, seizures, syncope, facial asymmetry, speech difficulty, weakness, light-headedness, numbness and headaches.  Psychiatric/Behavioral: Negative for confusion.         Observations/Objective: No vital signs or physical exam, this was a telephone or virtual health encounter.  Pt alert and oriented, answers all questions appropriately, and able to speak in full sentences.    Assessment and Plan: [name] was seen today for leg swelling.  Diagnoses and all orders for this visit:  Left ankle swelling Pt diuresing well at home with increased lasix dose. Will continue 40 mg for 5 more doses. Report any new or worsening symptoms. Medications as prescribed.  -     furosemide (LASIX) 40 MG tablet; Take 1 tablet (40 mg total) by mouth daily for 5 days.     Follow Up Instructions: Return in about 4 weeks (around 09/04/2018), or if symptoms worsen or fail to improve.    I discussed the assessment and treatment plan with the patient. The patient was provided an opportunity to ask questions and all were answered. The patient agreed with the plan and demonstrated an understanding of the instructions.   The patient was advised to call back or seek an in-person evaluation if the symptoms worsen or if the condition fails to  improve as anticipated.  The above assessment and management plan was discussed with the patient. The patient verbalized understanding of and has agreed to the management plan. Patient is aware to call the clinic if symptoms persist or worsen. Patient is aware when to return to the clinic for a follow-up visit. Patient educated on when it is appropriate to go to the emergency department.    I provided 15 minutes of non-face-to-face time during this encounter. The call started at 1405. The call ended at 1420.   Kari BaarsMichelle [name], FNP-C [facility] [address] [place], [place] [address] [phone]

## 2018-08-08 ENCOUNTER — Ambulatory Visit: Payer: Medicare Other | Admitting: Physical Therapy

## 2018-08-12 ENCOUNTER — Ambulatory Visit: Payer: Medicare Other | Admitting: Physical Therapy

## 2018-08-12 ENCOUNTER — Encounter: Payer: Self-pay | Admitting: Physical Therapy

## 2018-08-12 ENCOUNTER — Other Ambulatory Visit: Payer: Self-pay

## 2018-08-12 DIAGNOSIS — M6281 Muscle weakness (generalized): Secondary | ICD-10-CM

## 2018-08-12 DIAGNOSIS — M25511 Pain in right shoulder: Secondary | ICD-10-CM

## 2018-08-12 DIAGNOSIS — M25611 Stiffness of right shoulder, not elsewhere classified: Secondary | ICD-10-CM

## 2018-08-12 NOTE — Therapy (Signed)
[facility] [address] [place], [place], [address] Phone: [phone]   Fax:  [phone]  Physical Therapy Treatment  Patient Details  Name: [name] MRN: [id number] Date of Birth: 1934-10-03 Referring Provider (PT): Annell Greening, MD   Encounter Date: 08/12/2018  PT End of Session - 08/12/18 1257    Visit Number  3    Number of Visits  16    Date for PT Re-Evaluation  10/02/18    PT Start Time  1257    PT Stop Time  1337    PT Time Calculation (min)  40 min    Activity Tolerance  Patient tolerated treatment well    Behavior During Therapy  [facility] for tasks assessed/performed       Past Medical History:  Diagnosis Date  . Allergic rhinitis   . Anxiety   . Arthritis   . Asthma   . CAP (community acquired pneumonia) 11/26/2017  . Depression   . Dyspnea    with exertion   . Elevated troponin 04/05/2016  . Essential hypertension, benign   . GERD (gastroesophageal reflux disease)   . Headache    occiptal neuralgia - left   . Heart murmur   . History of blood transfusion    "related to miscarriage"  . Left bundle branch block    in & out of A fib  . Memory loss   . Mixed hyperlipidemia   . Occipital neuralgia of left side   . Osteopenia   . 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 Urinary urgency     Past Surgical History:  Procedure Laterality Date  . APPENDECTOMY    . BACK SURGERY    . CARDIAC CATHETERIZATION  2010   Normal coronaries  . CATARACT EXTRACTION W/PHACO  04/16/2011   Procedure: CATARACT EXTRACTION PHACO AND INTRAOCULAR LENS PLACEMENT (IOC);  Surgeon: Susa Simmonds;  Location: [facility];  Service: Ophthalmology;  Laterality: Left;  CDE=11.35  . CATARACT EXTRACTION W/PHACO  01/28/2012   Procedure: CATARACT EXTRACTION PHACO AND INTRAOCULAR LENS PLACEMENT (IOC);  Surgeon: Susa Simmonds, MD;  Location: [facility];  Service: Ophthalmology;  Laterality: Right;  CDI:8.15  . CYSTO  WITH HYDRODISTENSION  05/29/2012   Procedure: CYSTOSCOPY/HYDRODISTENSION;  Surgeon: Martina Sinner, MD;  Location: [facility];  Service: Urology;  Laterality: N/A;  INSTILLATION OF MARCAINE AND PYRIDIUM   . DILATION AND CURETTAGE OF UTERUS  <hysterectomy"  . EYE SURGERY    . HAMMER TOE SURGERY Bilateral 11/22/11   [facility], [name]  . JOINT REPLACEMENT    . LAPAROSCOPIC CHOLECYSTECTOMY  1991  . LUMBAR LAMINECTOMY  2011  . LUMBAR LAMINECTOMY/DECOMPRESSION MICRODISCECTOMY Left 06/06/2012   Procedure: LUMBAR LAMINECTOMY/DECOMPRESSION MICRODISCECTOMY 1 LEVEL;  Surgeon: Maeola Harman, MD;  Location: [facility];  Service: Neurosurgery;  Laterality: Left;  Left Lumbar two-three Laminectomy for resection of synovial cyst  . SHOULDER ARTHROSCOPY Right 02/12/2018   SHOULDER ARTHROSCOPY, BICEPS DEBRIDEMENT, REMOVAL OF LOOSE BODIES  . SHOULDER ARTHROSCOPY Right 02/12/2018   Procedure: RIGHT SHOULDER ARTHROSCOPY, BICEPS DEBRIDEMENT, REMOVAL OF LOOSE BODIES;  Surgeon: Eldred Manges, MD;  Location: [facility];  Service: Orthopedics;  Laterality: Right;  . SHOULDER OPEN ROTATOR CUFF REPAIR Right 2011  . TONSILLECTOMY    . TOTAL HIP ARTHROPLASTY Right 03/13/2013   Procedure: TOTAL HIP ARTHROPLASTY;  Surgeon: Nestor Lewandowsky, MD;  Location: [facility];  Service: Orthopedics;  Laterality: Right;  . TOTAL SHOULDER ARTHROPLASTY Right 07/02/2018  Procedure: RIGHT TOTAL SHOULDER ARTHROPLASTY;  Surgeon: Eldred Manges, MD;  Location: [facility] OR;  Service: Orthopedics;  Laterality: Right;  Marland Kitchen VAGINAL HYSTERECTOMY      There were no vitals filed for this visit.  Subjective Assessment - 08/12/18 1257    Subjective  COVID-19  screen performed prior to patient entering clinic.  Patient has no new complaints today other than reporting that her shoulder feels better. Patient reports that ankle swelling attributed to her heart per MD visit and was given medication.    Pertinent History  R total shoulder replacement 07/02/2018, HTN,  chronic diastolic heart failure, secondary cardiomyopathy, R THA, dementia and memory loss.    Limitations  Lifting;House hold activities    Patient Stated Goals  use arm normally    Currently in Pain?  No/denies         [facility] PT Assessment - 08/12/18 0001      Assessment   Medical Diagnosis  Primary Arthritis, Right shouldre    Referring Provider (PT)  Annell Greening, MD    Onset Date/Surgical Date  07/02/18    Hand Dominance  Right    Next MD Visit  n/a    Prior Therapy  no      Precautions   Precautions  Shoulder    Type of Shoulder Precautions  right total shoulder replacement                   [facility] Adult PT Treatment/Exercise - 08/12/18 0001      Modalities   Modalities  Electrical Stimulation;Vasopneumatic      Electrical Stimulation   Electrical Stimulation Location  R shoulder    Electrical Stimulation Action  Pre-Mod    Electrical Stimulation Parameters  80-150 hz x15 min    Electrical Stimulation Goals  Edema      Vasopneumatic   Number Minutes Vasopneumatic   15 minutes    Vasopnuematic Location   Shoulder    Vasopneumatic Pressure  Low    Vasopneumatic Temperature   34      Manual Therapy   Manual Therapy  Passive ROM    Passive ROM  PROM of R shoulder into flexion, ER, IR with gentle holds at end range               PT Short Term Goals - 07/31/18 1246      PT SHORT TERM GOAL #1   Title  STG=LTG        PT Long Term Goals - 07/31/18 1246      PT LONG TERM GOAL #1   Title  Patient will be independent with HEP and progression.    Time  8    Period  Weeks    Status  New      PT LONG TERM GOAL #2   Title  Patient will demonstrate 120+ degrees of right shoulder flexion AROM to improve ability to perform functional tasks.     Time  8    Period  Weeks    Status  New      PT LONG TERM GOAL #3   Title  Patient will demonstrate 60+ degrees of right shoulder ER AROM to improve ability to don/doff apparel.     Time  8    Period   Weeks    Status  New      PT LONG TERM GOAL #4   Title  Patient will report ability to perform ADLs with right shoulder pain less than  4/10.    Time  8    Period  Weeks    Status  New      PT LONG TERM GOAL #5   Title  Patient will demonstrate 4/5 or greater right shoulder MMT in all planes to improve stability during functional tasks.     Time  8    Period  Weeks    Status  New            Plan - 08/12/18 1330    Clinical Impression Statement  Patient presented in clinic with no complaints of any R shoulder pain. Great R shoulder ROM noted with flexion and ER during PROM. IR slightly more limited today during PROM. Firm end feels and smooth arc of motion noted in all directions of PROM. Minimal facial grimacing noted very intermittantly during PROM into flexion at end range. No other complaints of discomfort reported by patient. Good healing of R shoulder incision in anterior shoulder. Normal modalities response noted following removal of the modalities.    Personal Factors and Comorbidities  Age;Finances;Comorbidity 3+    Comorbidities  HTN, secondary cardiomyopathy, Chronic diastolic heart failure, dementia    Examination-Activity Limitations  Bathing;Lift;Dressing;Sleep;Carry    Stability/Clinical Decision Making  Stable/Uncomplicated    Rehab Potential  Fair    PT Frequency  2x / week    PT Duration  8 weeks    PT Treatment/Interventions  ADLs/Self Care Home Management;Electrical Stimulation;Moist Heat;Cryotherapy;Functional mobility training;Neuromuscular re-education;Therapeutic exercise;Therapeutic activities;Patient/family education;Manual techniques;Passive range of motion;Scar mobilization;Vasopneumatic Device    PT Next Visit Plan  Continue per protocol and progress to AAROM exercises.    PT Home Exercise Plan  See patient education section    Consulted and Agree with Plan of Care  Patient       Patient will benefit from skilled therapeutic intervention in order to  improve the following deficits and impairments:  Pain, Postural dysfunction, Decreased activity tolerance, Decreased range of motion, Decreased endurance, Decreased knowledge of precautions, Impaired UE functional use  Visit Diagnosis: Acute pain of right shoulder  Stiffness of right shoulder, not elsewhere classified  Muscle weakness (generalized)     Problem List Patient Active Problem List   Diagnosis Date Noted  . H/O total shoulder replacement, right 07/17/2018  . Shoulder arthritis 07/02/2018  . Dementia without behavioral disturbance (HCC) 05/29/2018  . SOB (shortness of breath) 04/22/2018  . COPD exacerbation (HCC)   . Occipital neuralgia of left side 03/20/2018  . Glenohumeral arthritis, right 02/12/2018  . Tobacco use 12/24/2017  . GOLD COPD 0 with Asthmatic Component    . Biceps tendinosis of right shoulder 08/04/2017  . Pre-operative cardiovascular examination 08/01/2017  . Carotid artery disease (HCC) 07/03/2017  . DOE (dyspnea on exertion) 07/03/2017  . Chronic right shoulder pain 12/27/2016  . Constipation 04/11/2016  . Chronic diastolic heart failure (HCC) 04/07/2016  . Hyperglycemia 04/05/2016  . Syncope 11/15/2015  . Dehydration 10/05/2015  . UTI (lower urinary tract infection) 10/05/2015  . Mild dementia (HCC) 09/13/2015  . Memory problem 05/07/2015  . Neck pain 04/06/2015  . Insomnia 12/10/2014  . BMI 28.0-28.9,adult 12/10/2014  . Osteopenia 05/19/2014  . GAD (generalized anxiety disorder) 01/06/2014  . Depression 01/06/2014  . GERD (gastroesophageal reflux disease) 01/06/2014  . Vitamin D deficiency 01/06/2014  . Overactive bladder 01/06/2014  . Secondary cardiomyopathy (HCC) 10/06/2013  . Left bundle branch block 10/06/2013  . Dizziness 10/04/2013  . Osteoarthritis of right hip 03/14/2013  . Cough 02/01/2011  . Reflux 02/01/2011  .  Allergic rhinitis 12/07/2008  . Chronic cough 12/07/2008  . Hepatitis B virus infection 08/16/2008  .  Hyperlipidemia 08/16/2008  . Essential hypertension 08/16/2008  . DISC DEGENERATION 07/24/2007    Marvell FullerKelsey P Atalaya Zappia, PTA 08/12/2018, 1:49 PM  [facility] [address] [place], [place], [address] Phone: [phone]   Fax:  [phone]  Name: [name] MRN: [id number] Date of Birth: Feb 08, 1935

## 2018-08-13 ENCOUNTER — Other Ambulatory Visit: Payer: Self-pay | Admitting: Family Medicine

## 2018-08-13 DIAGNOSIS — R110 Nausea: Secondary | ICD-10-CM

## 2018-08-15 ENCOUNTER — Other Ambulatory Visit: Payer: Self-pay

## 2018-08-15 ENCOUNTER — Other Ambulatory Visit: Payer: Self-pay | Admitting: Family Medicine

## 2018-08-15 ENCOUNTER — Encounter: Payer: Self-pay | Admitting: Physical Therapy

## 2018-08-15 ENCOUNTER — Ambulatory Visit: Payer: Medicare Other | Admitting: Physical Therapy

## 2018-08-15 DIAGNOSIS — M25511 Pain in right shoulder: Secondary | ICD-10-CM

## 2018-08-15 DIAGNOSIS — M5481 Occipital neuralgia: Secondary | ICD-10-CM

## 2018-08-15 DIAGNOSIS — M6281 Muscle weakness (generalized): Secondary | ICD-10-CM

## 2018-08-15 DIAGNOSIS — M25611 Stiffness of right shoulder, not elsewhere classified: Secondary | ICD-10-CM

## 2018-08-15 NOTE — Therapy (Signed)
[facility] [address] [place], [place], [address] Phone: [phone]   Fax:  [phone]  Physical Therapy Treatment  Patient Details  Name: [name] MRN: [id number] Date of Birth: 04/24/1935 Referring Provider (PT): Annell Greening, MD   Encounter Date: 08/15/2018  PT End of Session - 08/15/18 0950    Visit Number  4    Number of Visits  16    Date for PT Re-Evaluation  10/02/18    PT Start Time  0949    PT Stop Time  1033    PT Time Calculation (min)  44 min    Activity Tolerance  Patient tolerated treatment well    Behavior During Therapy  [facility] for tasks assessed/performed       Past Medical History:  Diagnosis Date  . Allergic rhinitis   . Anxiety   . Arthritis   . Asthma   . CAP (community acquired pneumonia) 11/26/2017  . Depression   . Dyspnea    with exertion   . Elevated troponin 04/05/2016  . Essential hypertension, benign   . GERD (gastroesophageal reflux disease)   . Headache    occiptal neuralgia - left   . Heart murmur   . History of blood transfusion    "related to miscarriage"  . Left bundle branch block    in & out of A fib  . Memory loss   . Mixed hyperlipidemia   . Occipital neuralgia of left side   . Osteopenia   . 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 Urinary urgency     Past Surgical History:  Procedure Laterality Date  . APPENDECTOMY    . BACK SURGERY    . CARDIAC CATHETERIZATION  2010   Normal coronaries  . CATARACT EXTRACTION W/PHACO  04/16/2011   Procedure: CATARACT EXTRACTION PHACO AND INTRAOCULAR LENS PLACEMENT (IOC);  Surgeon: Susa Simmonds;  Location: [facility];  Service: Ophthalmology;  Laterality: Left;  CDE=11.35  . CATARACT EXTRACTION W/PHACO  01/28/2012   Procedure: CATARACT EXTRACTION PHACO AND INTRAOCULAR LENS PLACEMENT (IOC);  Surgeon: Susa Simmonds, MD;  Location: [facility];  Service: Ophthalmology;  Laterality: Right;  CDI:8.15  . CYSTO  WITH HYDRODISTENSION  05/29/2012   Procedure: CYSTOSCOPY/HYDRODISTENSION;  Surgeon: Martina Sinner, MD;  Location: [facility];  Service: Urology;  Laterality: N/A;  INSTILLATION OF MARCAINE AND PYRIDIUM   . DILATION AND CURETTAGE OF UTERUS  <hysterectomy"  . EYE SURGERY    . HAMMER TOE SURGERY Bilateral 11/22/11   [facility], [name]  . JOINT REPLACEMENT    . LAPAROSCOPIC CHOLECYSTECTOMY  1991  . LUMBAR LAMINECTOMY  2011  . LUMBAR LAMINECTOMY/DECOMPRESSION MICRODISCECTOMY Left 06/06/2012   Procedure: LUMBAR LAMINECTOMY/DECOMPRESSION MICRODISCECTOMY 1 LEVEL;  Surgeon: Maeola Harman, MD;  Location: [facility];  Service: Neurosurgery;  Laterality: Left;  Left Lumbar two-three Laminectomy for resection of synovial cyst  . SHOULDER ARTHROSCOPY Right 02/12/2018   SHOULDER ARTHROSCOPY, BICEPS DEBRIDEMENT, REMOVAL OF LOOSE BODIES  . SHOULDER ARTHROSCOPY Right 02/12/2018   Procedure: RIGHT SHOULDER ARTHROSCOPY, BICEPS DEBRIDEMENT, REMOVAL OF LOOSE BODIES;  Surgeon: Eldred Manges, MD;  Location: [facility];  Service: Orthopedics;  Laterality: Right;  . SHOULDER OPEN ROTATOR CUFF REPAIR Right 2011  . TONSILLECTOMY    . TOTAL HIP ARTHROPLASTY Right 03/13/2013   Procedure: TOTAL HIP ARTHROPLASTY;  Surgeon: Nestor Lewandowsky, MD;  Location: [facility];  Service: Orthopedics;  Laterality: Right;  . TOTAL SHOULDER ARTHROPLASTY Right 07/02/2018  Procedure: RIGHT TOTAL SHOULDER ARTHROPLASTY;  Surgeon: Eldred Manges, MD;  Location: [facility] OR;  Service: Orthopedics;  Laterality: Right;  Marland Kitchen VAGINAL HYSTERECTOMY      There were no vitals filed for this visit.  Subjective Assessment - 08/15/18 0949    Subjective  COVID-19  screen performed prior to patient entering clinic.  Patient has no new complaints today other than reporting that her shoulder feels better.    Pertinent History  R total shoulder replacement 07/02/2018, HTN, chronic diastolic heart failure, secondary cardiomyopathy, R THA, dementia and memory loss.     Limitations  Lifting;House hold activities    Patient Stated Goals  use arm normally    Currently in Pain?  No/denies         [facility] PT Assessment - 08/15/18 0001      Assessment   Medical Diagnosis  Primary Arthritis, Right shouldre    Referring Provider (PT)  Annell Greening, MD    Onset Date/Surgical Date  07/02/18    Hand Dominance  Right    Next MD Visit  n/a    Prior Therapy  no      Precautions   Precautions  Shoulder    Type of Shoulder Precautions  right total shoulder replacement      ROM / Strength   AROM / PROM / Strength  AROM      AROM   Overall AROM   Deficits    AROM Assessment Site  Shoulder    Right/Left Shoulder  Right    Right Shoulder Flexion  142 Degrees    Right Shoulder Internal Rotation  55 Degrees    Right Shoulder External Rotation  62 Degrees                   [facility] Adult PT Treatment/Exercise - 08/15/18 0001      Exercises   Exercises  Shoulder      Shoulder Exercises: Supine   Protraction  AAROM;Both;20 reps    External Rotation  AAROM;Right;20 reps    Flexion  AAROM;Both;20 reps      Shoulder Exercises: Pulleys   Flexion  5 minutes      Shoulder Exercises: ROM/Strengthening   Ranger  Standing RUE ranger into flex x10 reps   fatigued   [place]  into flexion, CW and CCW x10 reps each   fatigued     Modalities   Modalities  Electrical Stimulation;Vasopneumatic      Electrical Stimulation   Electrical Stimulation Location  R shoulder     Electrical Stimulation Action  Pre-Mod    Electrical Stimulation Parameters  80-150 hz x15 min    Electrical Stimulation Goals  Edema      Vasopneumatic   Number Minutes Vasopneumatic   15 minutes    Vasopnuematic Location   Shoulder    Vasopneumatic Pressure  Low    Vasopneumatic Temperature   34      Manual Therapy   Manual Therapy  Passive ROM    Passive ROM  PROM of R shoulder into flexion, ER, IR with gentle holds at end range               PT Short Term Goals -  07/31/18 1246      PT SHORT TERM GOAL #1   Title  STG=LTG        PT Long Term Goals - 07/31/18 1246      PT LONG TERM GOAL #1   Title  Patient will  be independent with HEP and progression.    Time  8    Period  Weeks    Status  New      PT LONG TERM GOAL #2   Title  Patient will demonstrate 120+ degrees of right shoulder flexion AROM to improve ability to perform functional tasks.     Time  8    Period  Weeks    Status  New      PT LONG TERM GOAL #3   Title  Patient will demonstrate 60+ degrees of right shoulder ER AROM to improve ability to don/doff apparel.     Time  8    Period  Weeks    Status  New      PT LONG TERM GOAL #4   Title  Patient will report ability to perform ADLs with right shoulder pain less than 4/10.    Time  8    Period  Weeks    Status  New      PT LONG TERM GOAL #5   Title  Patient will demonstrate 4/5 or greater right shoulder MMT in all planes to improve stability during functional tasks.     Time  8    Period  Weeks    Status  New            Plan - 08/15/18 1031    Clinical Impression Statement  Patient presented in clinic with no complaints regarding her R shoulder and reports no pain. Patient progressed to more AAROM exercises today but her greatest complaint was of fatigue from exercises. More tactile and VCs were required during AAROM exercises in supine especially ER to maintain proper form. Firm end feels and smooth arc of motion noted during PROM of R shoulder in all directions. Patient more limited with R shoulder IR at this time. AROM measurements provided in today's note and all completed in supine. Normal modalities response noted following removal of the modalities.    Personal Factors and Comorbidities  Age;Finances;Comorbidity 3+    Comorbidities  HTN, secondary cardiomyopathy, Chronic diastolic heart failure, dementia    Examination-Activity Limitations  Bathing;Lift;Dressing;Sleep;Carry    Stability/Clinical Decision Making   Stable/Uncomplicated    Rehab Potential  Fair    PT Frequency  2x / week    PT Duration  8 weeks    PT Treatment/Interventions  ADLs/Self Care Home Management;Electrical Stimulation;Moist Heat;Cryotherapy;Functional mobility training;Neuromuscular re-education;Therapeutic exercise;Therapeutic activities;Patient/family education;Manual techniques;Passive range of motion;Scar mobilization;Vasopneumatic Device    PT Next Visit Plan  Continue per protocol and progress to AAROM exercises.    PT Home Exercise Plan  See patient education section    Consulted and Agree with Plan of Care  Patient       Patient will benefit from skilled therapeutic intervention in order to improve the following deficits and impairments:  Pain, Postural dysfunction, Decreased activity tolerance, Decreased range of motion, Decreased endurance, Decreased knowledge of precautions, Impaired UE functional use  Visit Diagnosis: Acute pain of right shoulder  Stiffness of right shoulder, not elsewhere classified  Muscle weakness (generalized)     Problem List Patient Active Problem List   Diagnosis Date Noted  . H/O total shoulder replacement, right 07/17/2018  . Shoulder arthritis 07/02/2018  . Dementia without behavioral disturbance (HCC) 05/29/2018  . SOB (shortness of breath) 04/22/2018  . COPD exacerbation (HCC)   . Occipital neuralgia of left side 03/20/2018  . Glenohumeral arthritis, right 02/12/2018  . Tobacco use 12/24/2017  . GOLD COPD 0  with Asthmatic Component    . Biceps tendinosis of right shoulder 08/04/2017  . Pre-operative cardiovascular examination 08/01/2017  . Carotid artery disease (HCC) 07/03/2017  . DOE (dyspnea on exertion) 07/03/2017  . Chronic right shoulder pain 12/27/2016  . Constipation 04/11/2016  . Chronic diastolic heart failure (HCC) 04/07/2016  . Hyperglycemia 04/05/2016  . Syncope 11/15/2015  . Dehydration 10/05/2015  . UTI (lower urinary tract infection) 10/05/2015  .  Mild dementia (HCC) 09/13/2015  . Memory problem 05/07/2015  . Neck pain 04/06/2015  . Insomnia 12/10/2014  . BMI 28.0-28.9,adult 12/10/2014  . Osteopenia 05/19/2014  . GAD (generalized anxiety disorder) 01/06/2014  . Depression 01/06/2014  . GERD (gastroesophageal reflux disease) 01/06/2014  . Vitamin D deficiency 01/06/2014  . Overactive bladder 01/06/2014  . Secondary cardiomyopathy (HCC) 10/06/2013  . Left bundle branch block 10/06/2013  . Dizziness 10/04/2013  . Osteoarthritis of right hip 03/14/2013  . Cough 02/01/2011  . Reflux 02/01/2011  . Allergic rhinitis 12/07/2008  . Chronic cough 12/07/2008  . Hepatitis B virus infection 08/16/2008  . Hyperlipidemia 08/16/2008  . Essential hypertension 08/16/2008  . DISC DEGENERATION 07/24/2007    Marvell FullerKelsey P , PTA 08/15/2018, 10:40 AM  [facility] [address] [place], [place], [address] Phone: [phone]   Fax:  [phone]  Name: [name] MRN: [id number] Date of Birth: 1935/01/01

## 2018-08-18 ENCOUNTER — Other Ambulatory Visit: Payer: Self-pay

## 2018-08-18 ENCOUNTER — Encounter: Payer: Self-pay | Admitting: Family Medicine

## 2018-08-18 ENCOUNTER — Telehealth: Payer: Self-pay | Admitting: Cardiology

## 2018-08-18 ENCOUNTER — Ambulatory Visit (INDEPENDENT_AMBULATORY_CARE_PROVIDER_SITE_OTHER): Payer: Medicare Other | Admitting: Family Medicine

## 2018-08-18 DIAGNOSIS — R1030 Lower abdominal pain, unspecified: Secondary | ICD-10-CM | POA: Diagnosis not present

## 2018-08-18 DIAGNOSIS — K5909 Other constipation: Secondary | ICD-10-CM

## 2018-08-18 DIAGNOSIS — R110 Nausea: Secondary | ICD-10-CM

## 2018-08-18 MED ORDER — ONDANSETRON HCL 4 MG PO TABS: 4.0000 mg | ORAL_TABLET | Freq: Three times a day (TID) | ORAL | 0 refills | Status: DC | PRN

## 2018-08-18 MED ORDER — POLYETHYLENE GLYCOL 3350 17 GM/SCOOP PO POWD: 1.0000 | Freq: Once | ORAL | 0 refills | Status: AC

## 2018-08-18 NOTE — Telephone Encounter (Signed)
Daughter, [name] (on [place]) is aware the appointment scheduled 4/29 with Dr Antoine Poche is a virtual appt.  Aware she will be called after the appt as rescheduled.  Her phone number is in the appt notes.  Pt has a iphone but daughter does not feel pt is capable of completing a video appt.

## 2018-08-18 NOTE — Telephone Encounter (Signed)
New Message:    Pt's daughter ca1led and wants pt to have a telephone visit on 08-27-18. She wants to know if after the doctor talk to the pt, if he could call her please. Pt is 89 and she might forget. Please calll her daughter, [name] afterward .Dean's phone number is [phone].

## 2018-08-18 NOTE — Progress Notes (Signed)
Virtual Visit via telephone Note Due to COVID-19, visit is conducted virtually and was requested by patient. This visit type was conducted due to national recommendations for restrictions regarding the COVID-19 Pandemic (e.g. social distancing) in an effort to limit this patient's exposure and mitigate transmission in our community.  Due to her co-morbid illnesses, this patient is at least at moderate risk for complications without adequate follow up.  This format is felt to be most appropriate for this patient at this time.  All issues noted in this document were discussed and addressed.  A physical exam was not performed with this format.   I connected with [name] on 08/18/18 at 1010 by telephone and verified that I am speaking with the correct person using two identifiers. [name] is currently located at home and no one is currently with them during visit. The provider, Kari BaarsMichelle Prisca Gearing, FNP is located in their office at time of visit.  I discussed the limitations, risks, security and privacy concerns of performing an evaluation and management service by telephone and the availability of in person appointments. I also discussed with the patient that there may be a patient responsible charge related to this service. The patient expressed understanding and agreed to proceed.  Subjective:  Patient ID: [name], female    DOB: [birth year], 84 y.o.   MRN: [id number]  Chief Complaint:  Abdominal Pain   HPI: [name] is a 84 y.o. female presenting on 08/18/2018 for Abdominal Pain   Pt reports she had a 15 minute bout of left sided abdominal pain today. She reports the pain was cramping to sharp. She denies nausea, vomiting, chest pain, shortness of breath, fever, chills, or diarrhea with the pain. States she has been constipated. States her last normal bowel movement was 2 weeks ago. States she had a very small hard bowel movement yesterday. She denies changes in stool  color or blood in her stool. No other associated symptoms.    Relevant past medical, surgical, family, and social history reviewed and updated as indicated.  Allergies and medications reviewed and updated.   Past Medical History:  Diagnosis Date   Allergic rhinitis    Anxiety    Arthritis    Asthma    CAP (community acquired pneumonia) 11/26/2017   Depression    Dyspnea    with exertion    Elevated troponin 04/05/2016   Essential hypertension, benign    GERD (gastroesophageal reflux disease)    Headache    occiptal neuralgia - left    Heart murmur    History of blood transfusion    "related to miscarriage"   Left bundle branch block    in & out of A fib   Memory loss    Mixed hyperlipidemia    Occipital neuralgia of left side    Osteopenia    Secondary cardiomyopathy (HCC)    LVEF 40-45%, likely nonischemic   Urinary urgency     Past Surgical History:  Procedure Laterality Date   APPENDECTOMY     BACK SURGERY     CARDIAC CATHETERIZATION  2010   Normal coronaries   CATARACT EXTRACTION W/PHACO  04/16/2011   Procedure: CATARACT EXTRACTION PHACO AND INTRAOCULAR LENS PLACEMENT (IOC);  Surgeon: Susa Simmondsarroll F Haines;  Location: [facility];  Service: Ophthalmology;  Laterality: Left;  CDE=11.35   CATARACT EXTRACTION W/PHACO  01/28/2012   Procedure: CATARACT EXTRACTION PHACO AND INTRAOCULAR LENS PLACEMENT (IOC);  Surgeon: Susa Simmondsarroll F Haines, MD;  Location:  [facility];  Service: Ophthalmology;  Laterality: Right;  CDI:8.15   CYSTO WITH HYDRODISTENSION  05/29/2012   Procedure: CYSTOSCOPY/HYDRODISTENSION;  Surgeon: Martina Sinner, MD;  Location: [facility];  Service: Urology;  Laterality: N/A;  INSTILLATION OF MARCAINE AND PYRIDIUM    DILATION AND CURETTAGE OF UTERUS  <hysterectomy"   EYE SURGERY     HAMMER TOE SURGERY Bilateral 11/22/11   [facility], [place]   JOINT REPLACEMENT     LAPAROSCOPIC CHOLECYSTECTOMY  1991   LUMBAR LAMINECTOMY  2011    LUMBAR LAMINECTOMY/DECOMPRESSION MICRODISCECTOMY Left 06/06/2012   Procedure: LUMBAR LAMINECTOMY/DECOMPRESSION MICRODISCECTOMY 1 LEVEL;  Surgeon: Maeola Harman, MD;  Location: [facility];  Service: Neurosurgery;  Laterality: Left;  Left Lumbar two-three Laminectomy for resection of synovial cyst   SHOULDER ARTHROSCOPY Right 02/12/2018   SHOULDER ARTHROSCOPY, BICEPS DEBRIDEMENT, REMOVAL OF LOOSE BODIES   SHOULDER ARTHROSCOPY Right 02/12/2018   Procedure: RIGHT SHOULDER ARTHROSCOPY, BICEPS DEBRIDEMENT, REMOVAL OF LOOSE BODIES;  Surgeon: Eldred Manges, MD;  Location: [facility];  Service: Orthopedics;  Laterality: Right;   SHOULDER OPEN ROTATOR CUFF REPAIR Right 2011   TONSILLECTOMY     TOTAL HIP ARTHROPLASTY Right 03/13/2013   Procedure: TOTAL HIP ARTHROPLASTY;  Surgeon: Nestor Lewandowsky, MD;  Location: [facility];  Service: Orthopedics;  Laterality: Right;   TOTAL SHOULDER ARTHROPLASTY Right 07/02/2018   Procedure: RIGHT TOTAL SHOULDER ARTHROPLASTY;  Surgeon: Eldred Manges, MD;  Location: [facility];  Service: Orthopedics;  Laterality: Right;   VAGINAL HYSTERECTOMY      Social History   Socioeconomic History   Marital status: Widowed    Spouse name: Not on file   Number of children: 5   Years of education: 8th grade   Highest education level: Not on file  Occupational History   Occupation: Worked in Geneticist, molecular for 30 years    Employer: RETIRED    Comment: Retired   Occupation: Part time care giver  Social Network engineer strain: Somewhat hard   Food insecurity:    Worry: Sometimes true    Inability: Never true   Transportation needs:    Medical: No    Non-medical: No  Tobacco Use   Smoking status: Light Tobacco Smoker    Years: 60.00    Types: Cigarettes   Smokeless tobacco: Never Used  Substance and Sexual Activity   Alcohol use: Not Currently    Comment: 11/27/2017 "drank some in my 20s"   Drug use: No   Sexual activity: Not Currently  Lifestyle     Physical activity:    Days per week: 0 days    Minutes per session: 0 min   Stress: Only a little  Relationships   Social connections:    Talks on phone: More than three times a week    Gets together: More than three times a week    Attends religious service: More than 4 times per year    Active member of club or organization: Yes    Attends meetings of clubs or organizations: More than 4 times per year    Relationship status: Widowed   Intimate partner violence:    Fear of current or ex partner: No    Emotionally abused: No    Physically abused: No    Forced sexual activity: No  Other Topics Concern   Not on file  Social History Narrative   Ms Levandoski is divorced. Her adult son lives with her and he is disabled due to  bilateral AKA due to diabetes.  She helps to provide care to him. She has three adult daughters that live in the [place] area. She talks with them daily and they come to visit once or twice a year. She has 7 grandchildren also.     Outpatient Encounter Medications as of 08/18/2018  Medication Sig   albuterol (PROVENTIL HFA;VENTOLIN HFA) 108 (90 Base) MCG/ACT inhaler Inhale 1-2 puffs into the lungs every 6 (six) hours as needed for wheezing or shortness of breath.   aspirin EC 81 MG tablet Take 81 mg by mouth every evening.    azelastine (ASTELIN) 0.1 % nasal spray Place 2 sprays into both nostrils 2 (two) times daily. Use in each nostril as directed   Biotin 5 MG TABS Take 5 mg by mouth every morning.    budesonide-formoterol (SYMBICORT) 160-4.5 MCG/ACT inhaler Inhale 2 puffs into the lungs 2 (two) times daily. (Patient taking differently: Inhale 2 puffs into the lungs 2 (two) times daily as needed (shortness of breath). )   buPROPion (WELLBUTRIN SR) 150 MG 12 hr tablet TAKE (1) TABLET TWICE A DAY. (Patient taking differently: Take 150 mg by mouth 2 (two) times daily. )   carvedilol (COREG) 6.25 MG tablet TAKE  (1)  TABLET TWICE A DAY WITH MEALS (BREAKFAST AND  SUPPER) (Patient taking differently: Take 6.25 mg by mouth 2 (two) times daily with a meal. )   colchicine 0.6 MG tablet Take 1 tablet (0.6 mg total) by mouth 2 (two) times daily for 5 days.   esomeprazole (NEXIUM) 40 MG capsule Take 1 capsule (40 mg total) by mouth daily before breakfast.   furosemide (LASIX) 20 MG tablet TAKE 2 TABLETS DAILY FOR 2 DAYS, THEN DECREASE BACK TO 1 DAILY   furosemide (LASIX) 40 MG tablet Take 1 tablet (40 mg total) by mouth daily for 5 days.   gabapentin (NEURONTIN) 400 MG capsule TAKE (1) CAPSULE THREE TIMES DAILY.   HYDROcodone-acetaminophen (NORCO) 7.5-325 MG tablet Take 1 tablet by mouth every 6 (six) hours as needed for moderate pain. post op pain   loratadine (CLARITIN) 10 MG tablet Take 1 tablet (10 mg total) by mouth daily. (Patient taking differently: Take 10 mg by mouth every morning. )   losartan (COZAAR) 50 MG tablet Take 1/2 tablet (25 mg total) by mouth daily. (Patient taking differently: Take 25 mg by mouth every morning. )   memantine (NAMENDA) 5 MG tablet Take 1 tablet (5 mg total) by mouth 2 (two) times daily.   Multiple Vitamins-Minerals (CENTRUM SILVER PO) Take 1 tablet by mouth every morning.    ondansetron (ZOFRAN) 4 MG tablet Take 1 tablet (4 mg total) by mouth every 8 (eight) hours as needed for nausea or vomiting.   polyethylene glycol powder (GLYCOLAX/MIRALAX) 17 GM/SCOOP powder Take 255 g by mouth once for 1 dose.   potassium chloride SA (K-DUR,KLOR-CON) 20 MEQ tablet Take 1 tablet (20 mEq total) by mouth daily. (Patient taking differently: Take 20 mEq by mouth every morning. )   pravastatin (PRAVACHOL) 80 MG tablet Take 1 tablet (80 mg total) by mouth daily. (Patient taking differently: Take 80 mg by mouth at bedtime. )   raloxifene (EVISTA) 60 MG tablet TAKE 1 TABLET DAILY (Patient taking differently: Take 60 mg by mouth daily. )   Spacer/Aero-Holding Chambers (AEROCHAMBER MV) inhaler Use as instructed   vitamin B-12  (CYANOCOBALAMIN) 1000 MCG tablet Take 1,000 mcg by mouth daily.   [DISCONTINUED] ondansetron (ZOFRAN) 4 MG tablet Take 1 tablet  by mouth every 8 hours as needed for nausea or vomiting.   No facility-administered encounter medications on file as of 08/18/2018.     Allergies  Allergen Reactions   Lisinopril-Hydrochlorothiazide Swelling, Other (See Comments) and Cough    Lip swelling and cough. Stopped by ENT. Patient is on Losartan without any issues    Codeine Itching and Swelling    eye irritation (prescribed percocet)   Penicillins Itching and Swelling    PATIENT HAS HAD A PCN REACTION WITH IMMEDIATE RASH, FACIAL/TONGUE/THROAT SWELLING, SOB, OR LIGHTHEADEDNESS WITH HYPOTENSION:  #  #  YES  #  #  Has patient had a PCN reaction causing severe rash involving mucus membranes or skin necrosis: No Has patient had a PCN reaction that required hospitalization No Has patient had a PCN reaction occurring within the last 10 years: No eye irritation   Aricept [Donepezil Hcl] Nausea And Vomiting   Namenda [Memantine Hcl] Nausea And Vomiting    Review of Systems  Constitutional: Negative for activity change, appetite change, chills, fatigue, fever and unexpected weight change.  Respiratory: Negative for cough and shortness of breath.   Cardiovascular: Negative for chest pain and palpitations.  Gastrointestinal: Positive for abdominal pain and constipation. Negative for abdominal distention, anal bleeding, blood in stool, diarrhea, nausea, rectal pain and vomiting.  Genitourinary: Negative for difficulty urinating, dysuria, flank pain, frequency, hematuria and urgency.  Neurological: Negative for dizziness, weakness, light-headedness and headaches.  Psychiatric/Behavioral: Negative for confusion.  All other systems reviewed and are negative.        Observations/Objective: No vital signs or physical exam, this was a telephone or virtual health encounter.  Pt alert and oriented, answers  all questions appropriately, and able to speak in full sentences.    Assessment and Plan: Asmara was seen today for abdominal pain.  Diagnoses and all orders for this visit:  Lower abdominal pain Pain has subsided, only lasted for 15 minutes. Pt to report any new or worsening symptoms. Increase water and fiber intake. Medications as prescribed.  -     polyethylene glycol powder (GLYCOLAX/MIRALAX) 17 GM/SCOOP powder; Take 255 g by mouth once for 1 dose.  Other constipation -     polyethylene glycol powder (GLYCOLAX/MIRALAX) 17 GM/SCOOP powder; Take 255 g by mouth once for 1 dose.  Nausea -     ondansetron (ZOFRAN) 4 MG tablet; Take 1 tablet (4 mg total) by mouth every 8 (eight) hours as needed for nausea or vomiting.     Follow Up Instructions: Return if symptoms worsen or fail to improve.    I discussed the assessment and treatment plan with the patient. The patient was provided an opportunity to ask questions and all were answered. The patient agreed with the plan and demonstrated an understanding of the instructions.   The patient was advised to call back or seek an in-person evaluation if the symptoms worsen or if the condition fails to improve as anticipated.  The above assessment and management plan was discussed with the patient. The patient verbalized understanding of and has agreed to the management plan. Patient is aware to call the clinic if symptoms persist or worsen. Patient is aware when to return to the clinic for a follow-up visit. Patient educated on when it is appropriate to go to the emergency department.    I provided 15 minutes of non-face-to-face time during this encounter. The call started at 1010. The call ended at 1025.   Kari Baars, FNP-C [facility] [phone]  [address] [place], [place] [address] [phone]

## 2018-08-19 ENCOUNTER — Other Ambulatory Visit: Payer: Self-pay

## 2018-08-19 ENCOUNTER — Encounter: Payer: Self-pay | Admitting: Physical Therapy

## 2018-08-19 ENCOUNTER — Ambulatory Visit: Payer: Medicare Other | Admitting: Physical Therapy

## 2018-08-19 DIAGNOSIS — M25511 Pain in right shoulder: Secondary | ICD-10-CM

## 2018-08-19 DIAGNOSIS — M6281 Muscle weakness (generalized): Secondary | ICD-10-CM

## 2018-08-19 DIAGNOSIS — M25611 Stiffness of right shoulder, not elsewhere classified: Secondary | ICD-10-CM

## 2018-08-19 NOTE — Therapy (Signed)
[facility] [address] [place], [place], [address] Phone: [phone]   Fax:  [phone]  Physical Therapy Treatment  Patient Details  Name: [name] MRN: [id number] Date of Birth: 1935/04/07 Referring Provider (PT): Annell Greening, MD   Encounter Date: 08/19/2018  PT End of Session - 08/19/18 0958    Visit Number  5    Number of Visits  16    Date for PT Re-Evaluation  10/02/18    PT Start Time  0953    PT Stop Time  1043    PT Time Calculation (min)  50 min    Activity Tolerance  Patient tolerated treatment well    Behavior During Therapy  [facility] for tasks assessed/performed       Past Medical History:  Diagnosis Date  . Allergic rhinitis   . Anxiety   . Arthritis   . Asthma   . CAP (community acquired pneumonia) 11/26/2017  . Depression   . Dyspnea    with exertion   . Elevated troponin 04/05/2016  . Essential hypertension, benign   . GERD (gastroesophageal reflux disease)   . Headache    occiptal neuralgia - left   . Heart murmur   . History of blood transfusion    "related to miscarriage"  . Left bundle branch block    in & out of A fib  . Memory loss   . Mixed hyperlipidemia   . Occipital neuralgia of left side   . Osteopenia   . 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 Urinary urgency     Past Surgical History:  Procedure Laterality Date  . APPENDECTOMY    . BACK SURGERY    . CARDIAC CATHETERIZATION  2010   Normal coronaries  . CATARACT EXTRACTION W/PHACO  04/16/2011   Procedure: CATARACT EXTRACTION PHACO AND INTRAOCULAR LENS PLACEMENT (IOC);  Surgeon: Susa Simmonds;  Location: [facility];  Service: Ophthalmology;  Laterality: Left;  CDE=11.35  . CATARACT EXTRACTION W/PHACO  01/28/2012   Procedure: CATARACT EXTRACTION PHACO AND INTRAOCULAR LENS PLACEMENT (IOC);  Surgeon: Susa Simmonds, MD;  Location: [facility];  Service: Ophthalmology;  Laterality: Right;  CDI:8.15  . CYSTO  WITH HYDRODISTENSION  05/29/2012   Procedure: CYSTOSCOPY/HYDRODISTENSION;  Surgeon: Martina Sinner, MD;  Location: [facility];  Service: Urology;  Laterality: N/A;  INSTILLATION OF MARCAINE AND PYRIDIUM   . DILATION AND CURETTAGE OF UTERUS  <hysterectomy"  . EYE SURGERY    . HAMMER TOE SURGERY Bilateral 11/22/11   [facility], [name]  . JOINT REPLACEMENT    . LAPAROSCOPIC CHOLECYSTECTOMY  1991  . LUMBAR LAMINECTOMY  2011  . LUMBAR LAMINECTOMY/DECOMPRESSION MICRODISCECTOMY Left 06/06/2012   Procedure: LUMBAR LAMINECTOMY/DECOMPRESSION MICRODISCECTOMY 1 LEVEL;  Surgeon: Maeola Harman, MD;  Location: [facility];  Service: Neurosurgery;  Laterality: Left;  Left Lumbar two-three Laminectomy for resection of synovial cyst  . SHOULDER ARTHROSCOPY Right 02/12/2018   SHOULDER ARTHROSCOPY, BICEPS DEBRIDEMENT, REMOVAL OF LOOSE BODIES  . SHOULDER ARTHROSCOPY Right 02/12/2018   Procedure: RIGHT SHOULDER ARTHROSCOPY, BICEPS DEBRIDEMENT, REMOVAL OF LOOSE BODIES;  Surgeon: Eldred Manges, MD;  Location: [facility];  Service: Orthopedics;  Laterality: Right;  . SHOULDER OPEN ROTATOR CUFF REPAIR Right 2011  . TONSILLECTOMY    . TOTAL HIP ARTHROPLASTY Right 03/13/2013   Procedure: TOTAL HIP ARTHROPLASTY;  Surgeon: Nestor Lewandowsky, MD;  Location: [facility];  Service: Orthopedics;  Laterality: Right;  . TOTAL SHOULDER ARTHROPLASTY Right 07/02/2018  Procedure: RIGHT TOTAL SHOULDER ARTHROPLASTY;  Surgeon: Eldred Manges, MD;  Location: [facility];  Service: Orthopedics;  Laterality: Right;  Marland Kitchen VAGINAL HYSTERECTOMY      There were no vitals filed for this visit.  Subjective Assessment - 08/19/18 0958    Subjective  COVID-19 screening performed prior to patient entering the building. Patient reports feeling better but still sore.     Pertinent History  R total shoulder replacement 07/02/2018, HTN, chronic diastolic heart failure, secondary cardiomyopathy, R THA, dementia and memory loss.    Limitations  Lifting;House hold  activities    Patient Stated Goals  use arm normally    Currently in Pain?  Yes    Pain Score  5     Pain Orientation  Right;Anterior    Pain Descriptors / Indicators  Aching    Pain Type  Surgical pain    Pain Onset  1 to 4 weeks ago         [facility] PT Assessment - 08/19/18 0001      Assessment   Medical Diagnosis  Primary Arthritis, Right shoulder    Referring Provider (PT)  Annell Greening, MD    Onset Date/Surgical Date  07/02/18    Hand Dominance  Right    Next MD Visit  n/a    Prior Therapy  no      Precautions   Precautions  Shoulder    Type of Shoulder Precautions  right total shoulder replacement                   [facility] Adult PT Treatment/Exercise - 08/19/18 0001      Exercises   Exercises  Shoulder      Shoulder Exercises: Supine   Protraction  AAROM;Both;20 reps    External Rotation  AAROM;Right;20 reps    Flexion  AAROM;Both;20 reps      Shoulder Exercises: Pulleys   Flexion  5 minutes      Shoulder Exercises: ROM/Strengthening   Ranger  seated ranger flexion, CW, CCW x2 minutes each      Shoulder Exercises: Isometric Strengthening   External Rotation  5X5";Supine    External Rotation Limitations  against PT resistance    Internal Rotation  5X5";Supine    Internal Rotation Limitations  against PT resistance      Modalities   Modalities  Electrical Stimulation;Vasopneumatic      Programme researcher, broadcasting/film/video Location  R shoulder     Electrical Stimulation Action  Pre-Mod    Electrical Stimulation Parameters  80-150 hz x15 mins    Electrical Stimulation Goals  Edema      Vasopneumatic   Number Minutes Vasopneumatic   15 minutes    Vasopnuematic Location   Shoulder    Vasopneumatic Pressure  Low    Vasopneumatic Temperature   34      Manual Therapy   Manual Therapy  Passive ROM    Passive ROM  PROM of R shoulder into flexion, ER, IR with gentle holds at end range               PT Short Term Goals - 07/31/18  1246      PT SHORT TERM GOAL #1   Title  STG=LTG        PT Long Term Goals - 07/31/18 1246      PT LONG TERM GOAL #1   Title  Patient will be independent with HEP and progression.    Time  8  Period  Weeks    Status  New      PT LONG TERM GOAL #2   Title  Patient will demonstrate 120+ degrees of right shoulder flexion AROM to improve ability to perform functional tasks.     Time  8    Period  Weeks    Status  New      PT LONG TERM GOAL #3   Title  Patient will demonstrate 60+ degrees of right shoulder ER AROM to improve ability to don/doff apparel.     Time  8    Period  Weeks    Status  New      PT LONG TERM GOAL #4   Title  Patient will report ability to perform ADLs with right shoulder pain less than 4/10.    Time  8    Period  Weeks    Status  New      PT LONG TERM GOAL #5   Title  Patient will demonstrate 4/5 or greater right shoulder MMT in all planes to improve stability during functional tasks.     Time  8    Period  Weeks    Status  New            Plan - 08/19/18 1030    Clinical Impression Statement  Patient was able to tolerate progression of treatment well but noted with fatigue with exercises. Patient was able to perfrom exercises with verbal and tactile cuing. Patient was able tolerate isometric strengthening in supine against PT resistance with no reports of increased pain. Normal response to modalities upon removal.     Personal Factors and Comorbidities  Age;Finances;Comorbidity 3+    Comorbidities  HTN, secondary cardiomyopathy, Chronic diastolic heart failure, dementia    Examination-Activity Limitations  Bathing;Lift;Dressing;Sleep;Carry    Stability/Clinical Decision Making  Stable/Uncomplicated    Clinical Decision Making  Moderate    Rehab Potential  Fair    PT Frequency  2x / week    PT Duration  8 weeks    PT Treatment/Interventions  ADLs/Self Care Home Management;Electrical Stimulation;Moist Heat;Cryotherapy;Functional mobility  training;Neuromuscular re-education;Therapeutic exercise;Therapeutic activities;Patient/family education;Manual techniques;Passive range of motion;Scar mobilization;Vasopneumatic Device    PT Next Visit Plan  Continue per protocol and progress to AAROM exercises.    Consulted and Agree with Plan of Care  Patient       Patient will benefit from skilled therapeutic intervention in order to improve the following deficits and impairments:  Pain, Postural dysfunction, Decreased activity tolerance, Decreased range of motion, Decreased endurance, Decreased knowledge of precautions, Impaired UE functional use  Visit Diagnosis: Stiffness of right shoulder, not elsewhere classified  Acute pain of right shoulder  Muscle weakness (generalized)     Problem List Patient Active Problem List   Diagnosis Date Noted  . H/O total shoulder replacement, right 07/17/2018  . Shoulder arthritis 07/02/2018  . Dementia without behavioral disturbance (HCC) 05/29/2018  . SOB (shortness of breath) 04/22/2018  . COPD exacerbation (HCC)   . Occipital neuralgia of left side 03/20/2018  . Glenohumeral arthritis, right 02/12/2018  . Tobacco use 12/24/2017  . GOLD COPD 0 with Asthmatic Component    . Biceps tendinosis of right shoulder 08/04/2017  . Pre-operative cardiovascular examination 08/01/2017  . Carotid artery disease (HCC) 07/03/2017  . DOE (dyspnea on exertion) 07/03/2017  . Chronic right shoulder pain 12/27/2016  . Constipation 04/11/2016  . Chronic diastolic heart failure (HCC) 04/07/2016  . Hyperglycemia 04/05/2016  . Syncope 11/15/2015  . Dehydration 10/05/2015  .  UTI (lower urinary tract infection) 10/05/2015  . Mild dementia (HCC) 09/13/2015  . Memory problem 05/07/2015  . Neck pain 04/06/2015  . Insomnia 12/10/2014  . BMI 28.0-28.9,adult 12/10/2014  . Osteopenia 05/19/2014  . GAD (generalized anxiety disorder) 01/06/2014  . Depression 01/06/2014  . GERD (gastroesophageal reflux  disease) 01/06/2014  . Vitamin D deficiency 01/06/2014  . Overactive bladder 01/06/2014  . Secondary cardiomyopathy (HCC) 10/06/2013  . Left bundle branch block 10/06/2013  . Dizziness 10/04/2013  . Osteoarthritis of right hip 03/14/2013  . Cough 02/01/2011  . Reflux 02/01/2011  . Allergic rhinitis 12/07/2008  . Chronic cough 12/07/2008  . Hepatitis B virus infection 08/16/2008  . Hyperlipidemia 08/16/2008  . Essential hypertension 08/16/2008  . DISC DEGENERATION 07/24/2007   Guss Bunde, PT, DPT 08/19/2018, 10:52 AM  [facility] [address] [place], [place], [address] Phone: [phone]   Fax:  [phone]  Name: MYLENA BLECK MRN: [id number] Date of Birth: 06-26-1934

## 2018-08-21 ENCOUNTER — Ambulatory Visit (INDEPENDENT_AMBULATORY_CARE_PROVIDER_SITE_OTHER): Payer: Medicare Other | Admitting: Orthopaedic Surgery

## 2018-08-21 ENCOUNTER — Other Ambulatory Visit: Payer: Self-pay

## 2018-08-21 ENCOUNTER — Encounter (INDEPENDENT_AMBULATORY_CARE_PROVIDER_SITE_OTHER): Payer: Self-pay | Admitting: Orthopaedic Surgery

## 2018-08-21 VITALS — Ht 62.0 in | Wt 162.0 lb

## 2018-08-21 DIAGNOSIS — Z96611 Presence of right artificial shoulder joint: Secondary | ICD-10-CM

## 2018-08-21 NOTE — Progress Notes (Signed)
Office Visit Note   Patient: [name]           Date of Birth: [birth year]           MRN: [id number] Visit Date: 08/21/2018              Requested by: Sonny Mastersakes, Linda M, FNP [address] [place], [place] [id number] PCP: Sonny Mastersakes, Linda M, FNP   Assessment & Plan: Visit Diagnoses:  1. H/O total shoulder replacement, right     Plan: Post total shoulder arthroplasty right.  Incision looks good she can get her arm above her head good relief of pain normal activities of daily living.  She is happy with the surgical result she can stop her therapy and just resume normal daily activities.  Follow-up with me will be on an as-needed basis she is happy with the surgical result.  She had some recent problems with her left ankle questionable gout which she has had in the past and uric acid was 5.4 which is in normal range.  The symptoms have resolved.  Creatinine was 1.05 with BUN of 19.  Follow-Up Instructions: Return if symptoms worsen or fail to improve.   Orders:  No orders of the defined types were placed in this encounter.  No orders of the defined types were placed in this encounter.     Procedures: No procedures performed   Clinical Data: No additional findings.   Subjective: Chief Complaint  Patient presents with  . Right Shoulder - Follow-up    07/02/2018 Right Total Shoulder Arthroplasty    HPI follow-up total shoulder arthroplasty right shoulder.  Review of Systems unchanged   Objective: Vital Signs: Ht 5\' 2"  (1.575 m)   Wt 162 lb (73.5 kg)   BMI 29.63 kg/m   Physical Exam shoulder arthroplasty incisions well-healed good sensation of her hand no swelling good flexion-extension she can reach overhead reach across to her opposite shoulder.  No tenderness about the shoulder.  Ortho Exam right anterior deltopectoral incision is well-healed.  Specialty Comments:  No specialty comments available.  Imaging: No results found.   PMFS History: Patient Active  Problem List   Diagnosis Date Noted  . H/O total shoulder replacement, right 07/17/2018  . Shoulder arthritis 07/02/2018  . Dementia without behavioral disturbance (HCC) 05/29/2018  . SOB (shortness of breath) 04/22/2018  . COPD exacerbation (HCC)   . Occipital neuralgia of left side 03/20/2018  . Glenohumeral arthritis, right 02/12/2018  . Tobacco use 12/24/2017  . GOLD COPD 0 with Asthmatic Component    . Biceps tendinosis of right shoulder 08/04/2017  . Pre-operative cardiovascular examination 08/01/2017  . Carotid artery disease (HCC) 07/03/2017  . DOE (dyspnea on exertion) 07/03/2017  . Chronic right shoulder pain 12/27/2016  . Constipation 04/11/2016  . Chronic diastolic heart failure (HCC) 04/07/2016  . Hyperglycemia 04/05/2016  . Syncope 11/15/2015  . Dehydration 10/05/2015  . UTI (lower urinary tract infection) 10/05/2015  . Mild dementia (HCC) 09/13/2015  . Memory problem 05/07/2015  . Neck pain 04/06/2015  . Insomnia 12/10/2014  . BMI 28.0-28.9,adult 12/10/2014  . Osteopenia 05/19/2014  . GAD (generalized anxiety disorder) 01/06/2014  . Depression 01/06/2014  . GERD (gastroesophageal reflux disease) 01/06/2014  . Vitamin D deficiency 01/06/2014  . Overactive bladder 01/06/2014  . Secondary cardiomyopathy (HCC) 10/06/2013  . Left bundle branch block 10/06/2013  . Dizziness 10/04/2013  . Osteoarthritis of right hip 03/14/2013  . Cough 02/01/2011  . Reflux 02/01/2011  . Allergic rhinitis 12/07/2008  .  Chronic cough 12/07/2008  . Hepatitis B virus infection 08/16/2008  . Hyperlipidemia 08/16/2008  . Essential hypertension 08/16/2008  . DISC DEGENERATION 07/24/2007   Past Medical History:  Diagnosis Date  . Allergic rhinitis   . Anxiety   . Arthritis   . Asthma   . CAP (community acquired pneumonia) 11/26/2017  . Depression   . Dyspnea    with exertion   . Elevated troponin 04/05/2016  . Essential hypertension, benign   . GERD (gastroesophageal reflux  disease)   . Headache    occiptal neuralgia - left   . Heart murmur   . History of blood transfusion    "related to miscarriage"  . Left bundle branch block    in & out of A fib  . Memory loss   . Mixed hyperlipidemia   . Occipital neuralgia of left side   . Osteopenia   . 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 Urinary urgency     Family History  Problem Relation Age of Onset  . Diabetes Father   . Diabetes Brother   . Cancer Brother [id number]       colon  . Diabetes Brother   . Diabetes Brother   . Hypertension Sister   . Diabetes Sister   . Diabetes Sister   . Diabetes Other   . Allergies Other   . Diabetes Son   . Colon cancer Neg Hx   . Anesthesia problems Neg Hx   . Hypotension Neg Hx   . Malignant hyperthermia Neg Hx   . Pseudochol deficiency Neg Hx     Past Surgical History:  Procedure Laterality Date  . APPENDECTOMY    . BACK SURGERY    . CARDIAC CATHETERIZATION  2010   Normal coronaries  . CATARACT EXTRACTION W/PHACO  04/16/2011   Procedure: CATARACT EXTRACTION PHACO AND INTRAOCULAR LENS PLACEMENT (IOC);  Surgeon: Susa Simmondsarroll F Haines;  Location: [facility];  Service: Ophthalmology;  Laterality: Left;  CDE=11.35  . CATARACT EXTRACTION W/PHACO  01/28/2012   Procedure: CATARACT EXTRACTION PHACO AND INTRAOCULAR LENS PLACEMENT (IOC);  Surgeon: Susa Simmondsarroll F Haines, MD;  Location: [facility];  Service: Ophthalmology;  Laterality: Right;  CDI:8.15  . CYSTO WITH HYDRODISTENSION  05/29/2012   Procedure: CYSTOSCOPY/HYDRODISTENSION;  Surgeon: Martina SinnerScott A MacDiarmid, MD;  [facility];  Service: Urology;  Laterality: N/A;  INSTILLATION OF MARCAINE AND PYRIDIUM   . DILATION AND CURETTAGE OF UTERUS  <hysterectomy"  . EYE SURGERY    . HAMMER TOE SURGERY Bilateral 11/22/11   [facility], [place]  . JOINT REPLACEMENT    . LAPAROSCOPIC CHOLECYSTECTOMY  1991  . LUMBAR LAMINECTOMY  2011  . LUMBAR LAMINECTOMY/DECOMPRESSION MICRODISCECTOMY Left 06/06/2012    Procedure: LUMBAR LAMINECTOMY/DECOMPRESSION MICRODISCECTOMY 1 LEVEL;  Surgeon: Maeola HarmanJoseph Stern, MD;  Location: [facility];  Service: Neurosurgery;  Laterality: Left;  Left Lumbar two-three Laminectomy for resection of synovial cyst  . SHOULDER ARTHROSCOPY Right 02/12/2018   SHOULDER ARTHROSCOPY, BICEPS DEBRIDEMENT, REMOVAL OF LOOSE BODIES  . SHOULDER ARTHROSCOPY Right 02/12/2018   Procedure: RIGHT SHOULDER ARTHROSCOPY, BICEPS DEBRIDEMENT, REMOVAL OF LOOSE BODIES;  Surgeon: Eldred MangesYates, Kella Splinter C, MD;  Location: [facility];  Service: Orthopedics;  Laterality: Right;  . SHOULDER OPEN ROTATOR CUFF REPAIR Right 2011  . TONSILLECTOMY    . TOTAL HIP ARTHROPLASTY Right 03/13/2013   Procedure: TOTAL HIP ARTHROPLASTY;  Surgeon: Nestor LewandowskyFrank J Rowan, MD;  Location: [facility];  Service: Orthopedics;  Laterality: Right;  . TOTAL SHOULDER ARTHROPLASTY Right  07/02/2018   Procedure: RIGHT TOTAL SHOULDER ARTHROPLASTY;  Surgeon: Eldred Manges, MD;  [facility];  Service: Orthopedics;  Laterality: Right;  Marland Kitchen VAGINAL HYSTERECTOMY     Social History   Occupational History  . Occupation: Worked in Geneticist, molecular for 30 years    Employer: RETIRED    Comment: Retired  . Occupation: Part time care giver  Tobacco Use  . Smoking status: Light Tobacco Smoker    Years: 60.00    Types: Cigarettes  . Smokeless tobacco: Never Used  Substance and Sexual Activity  . Alcohol use: Not Currently    Comment: 11/27/2017 "drank some in my 20s"  . Drug use: No  . Sexual activity: Not Currently

## 2018-08-22 ENCOUNTER — Ambulatory Visit: Payer: Medicare Other | Admitting: Physical Therapy

## 2018-08-22 ENCOUNTER — Other Ambulatory Visit: Payer: Self-pay

## 2018-08-22 DIAGNOSIS — M25611 Stiffness of right shoulder, not elsewhere classified: Secondary | ICD-10-CM

## 2018-08-22 DIAGNOSIS — M6281 Muscle weakness (generalized): Secondary | ICD-10-CM

## 2018-08-22 DIAGNOSIS — M25511 Pain in right shoulder: Secondary | ICD-10-CM | POA: Diagnosis not present

## 2018-08-22 NOTE — Therapy (Signed)
[facility], [place], [address] Phone: [phone]   Fax:  [phone]  Physical Therapy Treatment  Patient Details  Name: [name] MRN: [id number] Date of Birth: 16-Mar-1935 Referring Provider (PT): Rodell Perna, MD   Encounter Date: 08/22/2018  PT End of Session - 08/22/18 1019    Visit Number  6    Number of Visits  16    Date for PT Re-Evaluation  10/02/18    PT Start Time  0940    PT Stop Time  1033    PT Time Calculation (min)  53 min    Activity Tolerance  Patient tolerated treatment well    Behavior During Therapy  [facility] for tasks assessed/performed       Past Medical History:  Diagnosis Date  . Allergic rhinitis   . Anxiety   . Arthritis   . Asthma   . CAP (community acquired pneumonia) 11/26/2017  . Depression   . Dyspnea    with exertion   . Elevated troponin 04/05/2016  . Essential hypertension, benign   . GERD (gastroesophageal reflux disease)   . Headache    occiptal neuralgia - left   . Heart murmur   . History of blood transfusion    "related to miscarriage"  . Left bundle branch block    in & out of A fib  . Memory loss   . Mixed hyperlipidemia   . Occipital neuralgia of left side   . Osteopenia   . 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 Urinary urgency     Past Surgical History:  Procedure Laterality Date  . APPENDECTOMY    . BACK SURGERY    . CARDIAC CATHETERIZATION  2010   Normal coronaries  . CATARACT EXTRACTION W/PHACO  04/16/2011   Procedure: CATARACT EXTRACTION PHACO AND INTRAOCULAR LENS PLACEMENT (IOC);  Surgeon: Williams Che;  Location: [facility];  Service: Ophthalmology;  Laterality: Left;  CDE=11.35  . CATARACT EXTRACTION W/PHACO  01/28/2012   Procedure: CATARACT EXTRACTION PHACO AND INTRAOCULAR LENS PLACEMENT (IOC);  Surgeon: Williams Che, MD;  Location: [facility];  Service: Ophthalmology;  Laterality: Right;  CDI:8.15  . CYSTO  WITH HYDRODISTENSION  05/29/2012   Procedure: CYSTOSCOPY/HYDRODISTENSION;  Surgeon: Reece Packer, MD;  [facility];  Service: Urology;  Laterality: N/A;  INSTILLATION OF MARCAINE AND PYRIDIUM   . DILATION AND CURETTAGE OF UTERUS  <hysterectomy"  . EYE SURGERY    . HAMMER TOE SURGERY Bilateral 11/22/11   [place], [place]  . JOINT REPLACEMENT    . LAPAROSCOPIC CHOLECYSTECTOMY  1991  . LUMBAR LAMINECTOMY  2011  . LUMBAR LAMINECTOMY/DECOMPRESSION MICRODISCECTOMY Left 06/06/2012   Procedure: LUMBAR LAMINECTOMY/DECOMPRESSION MICRODISCECTOMY 1 LEVEL;  Surgeon: Erline Levine, MD;  Location: [facility];  Service: Neurosurgery;  Laterality: Left;  Left Lumbar two-three Laminectomy for resection of synovial cyst  . SHOULDER ARTHROSCOPY Right 02/12/2018   SHOULDER ARTHROSCOPY, BICEPS DEBRIDEMENT, REMOVAL OF LOOSE BODIES  . SHOULDER ARTHROSCOPY Right 02/12/2018   Procedure: RIGHT SHOULDER ARTHROSCOPY, BICEPS DEBRIDEMENT, REMOVAL OF LOOSE BODIES;  Surgeon: Marybelle Killings, MD;  Location: [place];  Service: Orthopedics;  Laterality: Right;  . SHOULDER OPEN ROTATOR CUFF REPAIR Right 2011  . TONSILLECTOMY    . TOTAL HIP ARTHROPLASTY Right 03/13/2013   Procedure: TOTAL HIP ARTHROPLASTY;  Surgeon: Kerin Salen, MD;  Location: [place];  Service: Orthopedics;  Laterality: Right;  . TOTAL SHOULDER ARTHROPLASTY Right 07/02/2018  Procedure: RIGHT TOTAL SHOULDER ARTHROPLASTY;  Surgeon: Marybelle Killings, MD;  Location: [place];  Service: Orthopedics;  Laterality: Right;  Marland Kitchen VAGINAL HYSTERECTOMY      There were no vitals filed for this visit.  Subjective Assessment - 08/22/18 1024    Subjective  COVID-19 screen performed prior to patient entering clinic.  Doctor was very pleased and said I won't need anymore PT.    Pertinent History  R total shoulder replacement 07/02/2018, HTN, chronic diastolic heart failure, secondary cardiomyopathy, R THA, dementia and memory loss.    Limitations  Lifting;House hold  activities    Patient Stated Goals  use arm normally    Currently in Pain?  Yes    Pain Score  2     Pain Onset  1 to 4 weeks ago                       [facility] Adult PT Treatment/Exercise - 08/22/18 0001      Exercises   Exercises  Shoulder      Shoulder Exercises: Pulleys   Flexion  5 minutes    Other Pulley Exercises  Seated UE Ranger x 5 minutes.    Other Pulley Exercises  Wall ladder x 5 minutes.      Shoulder Exercises: ROM/Strengthening   UBE (Upper Arm Bike)  8 minutes in an active-assistive fashion.      Acupuncturist Location  Right shoulder.    Electrical Stimulation Action  IFC    Electrical Stimulation Parameters  80-150 Hz x 20 minutes.    Electrical Stimulation Goals  Edema      Vasopneumatic   Number Minutes Vasopneumatic   20 minutes    Vasopnuematic Location   --   Right shoulder.  Pillow between rt elbow and thorax.   Vasopneumatic Pressure  Low               PT Short Term Goals - 07/31/18 1246      PT SHORT TERM GOAL #1   Title  STG=LTG        PT Long Term Goals - 08/22/18 1020      PT LONG TERM GOAL #1   Title  Patient will be independent with HEP and progression.    Time  8    Period  Weeks    Status  Achieved      PT LONG TERM GOAL #2   Title  Patient will demonstrate 120+ degrees of right shoulder flexion AROM to improve ability to perform functional tasks.     Baseline  147 degrees.    Time  8    Period  Weeks    Status  Achieved      PT LONG TERM GOAL #3   Baseline  84 degrees.    Time  8    Status  Achieved      PT LONG TERM GOAL #4   Title  Patient will report ability to perform ADLs with right shoulder pain less than 4/10.    Baseline  No     Time  8    Status  Achieved      PT LONG TERM GOAL #5   Title  Patient will demonstrate 4/5 or greater right shoulder MMT in all planes to improve stability during functional tasks.     Baseline  4/5.    Time  8    Period   Weeks    Status  Achieved              Patient will benefit from skilled therapeutic intervention in order to improve the following deficits and impairments:     Visit Diagnosis: Stiffness of right shoulder, not elsewhere classified  Acute pain of right shoulder  Muscle weakness (generalized)     Problem List Patient Active Problem List   Diagnosis Date Noted  . H/O total shoulder replacement, right 07/17/2018  . Shoulder arthritis 07/02/2018  . Dementia without behavioral disturbance ([place]) 05/29/2018  . SOB (shortness of breath) 04/22/2018  . COPD exacerbation ([place])   . Occipital neuralgia of left side 03/20/2018  . Glenohumeral arthritis, right 02/12/2018  . Tobacco use 12/24/2017  . GOLD COPD 0 with Asthmatic Component    . Biceps tendinosis of right shoulder 08/04/2017  . Pre-operative cardiovascular examination 08/01/2017  . Carotid artery disease ([place]) 07/03/2017  . DOE (dyspnea on exertion) 07/03/2017  . Chronic right shoulder pain 12/27/2016  . Constipation 04/11/2016  . Chronic diastolic heart failure ([place]) 04/07/2016  . Hyperglycemia 04/05/2016  . Syncope 11/15/2015  . Dehydration 10/05/2015  . UTI (lower urinary tract infection) 10/05/2015  . Mild dementia ([place]) 09/13/2015  . Memory problem 05/07/2015  . Neck pain 04/06/2015  . Insomnia 12/10/2014  . BMI 28.0-28.9,adult 12/10/2014  . Osteopenia 05/19/2014  . GAD (generalized anxiety disorder) 01/06/2014  . Depression 01/06/2014  . GERD (gastroesophageal reflux disease) 01/06/2014  . Vitamin D deficiency 01/06/2014  . Overactive bladder 01/06/2014  . Secondary cardiomyopathy ([place]) 10/06/2013  . Left bundle branch block 10/06/2013  . Dizziness 10/04/2013  . Osteoarthritis of right hip 03/14/2013  . Cough 02/01/2011  . Reflux 02/01/2011  . Allergic rhinitis 12/07/2008  . Chronic cough 12/07/2008  . Hepatitis B virus infection 08/16/2008  . Hyperlipidemia 08/16/2008  . Essential hypertension  08/16/2008  . [place] DEGENERATION 07/24/2007   PHYSICAL THERAPY DISCHARGE SUMMARY  Visits from Start of Care: 6.  Current functional level related to goals / functional outcomes: See above.   Remaining deficits: All goals met.   Education / Equipment: HEP. Plan: Patient agrees to discharge.  Patient goals were met. Patient is being discharged due to meeting the stated rehab goals.  ?????      Karsyn Jamie, [place] MPT 08/22/2018, 11:06 AM  [facility] [address] [place], [place], [address] Phone: [phone]   Fax:  [phone]  Name: ICELYNN ONKEN MRN: [id number] Date of Birth: Jun 30, 1934

## 2018-08-26 NOTE — Progress Notes (Signed)
Virtual Visit via Telephone Note   This visit type was conducted due to national recommendations for restrictions regarding the COVID-19 Pandemic (e.g. social distancing) in an effort to limit this patient's exposure and mitigate transmission in our community.  Due to her co-morbid illnesses, this patient is at least at moderate risk for complications without adequate follow up.  This format is felt to be most appropriate for this patient at this time.  The patient did not have access to video technology/had technical difficulties with video requiring transitioning to audio format only (telephone).  All issues noted in this document were discussed and addressed.  No physical exam could be performed with this format.  Please refer to the patient's chart for her  consent to telehealth for [facility].   Evaluation Performed:  Follow-up visit  Date:  08/27/2018   ID:  [name], DOB June 05, 1934, MRN [id number]  Patient Location: Home Provider Location: Home  PCP:  Sonny Mastersakes, Linda M, FNP  Cardiologist:  Rollene RotundaJames Nilda Keathley, MD  Electrophysiologist:  None   Chief Complaint:  Ankle swelling  History of Present Illness:    [name] is a 84 y.o. female who  presents for follow up of diastolic CHF.  Echo 07/08/17 demonstrated preserved EF.  Since I last saw her she did have some increased lower extremity ankle swelling.  She was given a couple of days of increased Lasix.  Her BNP was very minimally elevated at 191.  She said she might be a little short of breath.  She was somewhat vague about the symptoms.  She is otherwise done well.  She denies any PND or orthopnea.  She is watching her salt.  She denied having any chest pressure, neck or arm discomfort.  She denies any cough fevers or chills.  She is isolating although her son who is an amputee is with her and his nurse comes in and out.   The patient does not have symptoms concerning for COVID-19 infection (fever, chills, cough, or new  shortness of breath).    Past Medical History:  Diagnosis Date  . Allergic rhinitis   . Anxiety   . Arthritis   . Asthma   . CAP (community acquired pneumonia) 11/26/2017  . Depression   . Dyspnea    with exertion   . Elevated troponin 04/05/2016  . Essential hypertension, benign   . GERD (gastroesophageal reflux disease)   . Headache    occiptal neuralgia - left   . Heart murmur   . History of blood transfusion    "related to miscarriage"  . Left bundle branch block    in & out of A fib  . Memory loss   . Mixed hyperlipidemia   . Occipital neuralgia of left side   . Osteopenia   . 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 Urinary urgency    Past Surgical History:  Procedure Laterality Date  . APPENDECTOMY    . BACK SURGERY    . CARDIAC CATHETERIZATION  2010   Normal coronaries  . CATARACT EXTRACTION W/PHACO  04/16/2011   Procedure: CATARACT EXTRACTION PHACO AND INTRAOCULAR LENS PLACEMENT (IOC);  Surgeon: Susa Simmondsarroll F Haines;  Location: [facility];  Service: Ophthalmology;  Laterality: Left;  CDE=11.35  . CATARACT EXTRACTION W/PHACO  01/28/2012   Procedure: CATARACT EXTRACTION PHACO AND INTRAOCULAR LENS PLACEMENT (IOC);  Surgeon: Susa Simmondsarroll F Haines, MD;  Location: [facility];  Service: Ophthalmology;  Laterality: Right;  CDI:8.15  .  CYSTO WITH HYDRODISTENSION  05/29/2012   Procedure: CYSTOSCOPY/HYDRODISTENSION;  Surgeon: Martina SinnerScott A MacDiarmid, MD;  [facility];  Service: Urology;  Laterality: N/A;  INSTILLATION OF MARCAINE AND PYRIDIUM   . DILATION AND CURETTAGE OF UTERUS  <hysterectomy"  . EYE SURGERY    . HAMMER TOE SURGERY Bilateral 11/22/11   [facility] [place]  . JOINT REPLACEMENT    . LAPAROSCOPIC CHOLECYSTECTOMY  1991  . LUMBAR LAMINECTOMY  2011  . LUMBAR LAMINECTOMY/DECOMPRESSION MICRODISCECTOMY Left 06/06/2012   Procedure: LUMBAR LAMINECTOMY/DECOMPRESSION MICRODISCECTOMY 1 LEVEL;  Surgeon: Maeola HarmanJoseph Stern, MD;  Location: [facility];   Service: Neurosurgery;  Laterality: Left;  Left Lumbar two-three Laminectomy for resection of synovial cyst  . SHOULDER ARTHROSCOPY Right 02/12/2018   SHOULDER ARTHROSCOPY, BICEPS DEBRIDEMENT, REMOVAL OF LOOSE BODIES  . SHOULDER ARTHROSCOPY Right 02/12/2018   Procedure: RIGHT SHOULDER ARTHROSCOPY, BICEPS DEBRIDEMENT, REMOVAL OF LOOSE BODIES;  Surgeon: Eldred MangesYates, Mark C, MD;  Location: [facility];  Service: Orthopedics;  Laterality: Right;  . SHOULDER OPEN ROTATOR CUFF REPAIR Right 2011  . TONSILLECTOMY    . TOTAL HIP ARTHROPLASTY Right 03/13/2013   Procedure: TOTAL HIP ARTHROPLASTY;  Surgeon: Nestor LewandowskyFrank J Rowan, MD;  Location: [facility];  Service: Orthopedics;  Laterality: Right;  . TOTAL SHOULDER ARTHROPLASTY Right 07/02/2018   Procedure: RIGHT TOTAL SHOULDER ARTHROPLASTY;  Surgeon: Eldred MangesYates, Mark C, MD;  Location: [facility];  Service: Orthopedics;  Laterality: Right;  Marland Kitchen. VAGINAL HYSTERECTOMY       Current Meds  Medication Sig  . albuterol (PROVENTIL HFA;VENTOLIN HFA) 108 (90 Base) MCG/ACT inhaler Inhale 1-2 puffs into the lungs every 6 (six) hours as needed for wheezing or shortness of breath.  Marland Kitchen. aspirin EC 81 MG tablet Take 81 mg by mouth every evening.   Marland Kitchen. azelastine (ASTELIN) 0.1 % nasal spray Place 2 sprays into both nostrils 2 (two) times daily. Use in each nostril as directed  . budesonide-formoterol (SYMBICORT) 160-4.5 MCG/ACT inhaler Inhale 2 puffs into the lungs 2 (two) times daily. (Patient taking differently: Inhale 2 puffs into the lungs 2 (two) times daily as needed (shortness of breath). )  . buPROPion (WELLBUTRIN SR) 150 MG 12 hr tablet TAKE (1) TABLET TWICE A DAY. (Patient taking differently: Take 150 mg by mouth 2 (two) times daily. )  . carvedilol (COREG) 6.25 MG tablet TAKE  (1)  TABLET TWICE A DAY WITH MEALS (BREAKFAST AND SUPPER) (Patient taking differently: Take 6.25 mg by mouth 2 (two) times daily with a meal. )  . esomeprazole (NEXIUM) 40 MG capsule Take 1 capsule (40 mg total) by mouth daily  before breakfast.  . furosemide (LASIX) 20 MG tablet TAKE 2 TABLETS DAILY FOR 2 DAYS, THEN DECREASE BACK TO 1 DAILY  . HYDROcodone-acetaminophen (NORCO) 7.5-325 MG tablet Take 1 tablet by mouth every 6 (six) hours as needed for moderate pain. post op pain  . loratadine (CLARITIN) 10 MG tablet Take 1 tablet (10 mg total) by mouth daily. (Patient taking differently: Take 10 mg by mouth every morning. )  . losartan (COZAAR) 50 MG tablet Take 1/2 tablet (25 mg total) by mouth daily. (Patient taking differently: Take 25 mg by mouth every morning. )  . memantine (NAMENDA) 5 MG tablet Take 1 tablet (5 mg total) by mouth 2 (two) times daily.  . Multiple Vitamins-Minerals (CENTRUM SILVER PO) Take 1 tablet by mouth every morning.   . ondansetron (ZOFRAN) 4 MG tablet Take 1 tablet (4 mg total) by mouth every 8 (eight) hours as needed for  nausea or vomiting.  . potassium chloride SA (K-DUR,KLOR-CON) 20 MEQ tablet Take 1 tablet (20 mEq total) by mouth daily. (Patient taking differently: Take 20 mEq by mouth every morning. )  . pravastatin (PRAVACHOL) 80 MG tablet Take 1 tablet (80 mg total) by mouth daily. (Patient taking differently: Take 80 mg by mouth at bedtime. )  . raloxifene (EVISTA) 60 MG tablet TAKE 1 TABLET DAILY (Patient taking differently: Take 60 mg by mouth daily. )  . Spacer/Aero-Holding Chambers (AEROCHAMBER MV) inhaler Use as instructed     Allergies:   Lisinopril-hydrochlorothiazide; Codeine; Penicillins; Aricept [donepezil hcl]; and Namenda [memantine hcl]   Social History   Tobacco Use  . Smoking status: Light Tobacco Smoker    Years: 60.00    Types: Cigarettes  . Smokeless tobacco: Never Used  Substance Use Topics  . Alcohol use: Not Currently    Comment: 11/27/2017 "drank some in my 20s"  . Drug use: No     Family Hx: The patient's family history includes Allergies in an other family member; Cancer (age of onset: 17) in her brother; Diabetes in her brother, brother, brother,  father, sister, sister, son, and another family member; Hypertension in her sister. There is no history of Colon cancer, Anesthesia problems, Hypotension, Malignant hyperthermia, or Pseudochol deficiency.  ROS:   Please see the history of present illness.    As stated in the HPI and negative for all other systems.   Prior CV studies:   The following studies were reviewed today:  Labs and recent office note  Labs/Other Tests and Data Reviewed:    EKG:  NSR, rate 60, axis within normal limits, premature atrial contractions, interventricular conduction delay with repolarization changes.  Recent Labs: 09/07/2017: NT-Pro BNP 1,147 11/26/2017: Magnesium 2.3; TSH 0.859 06/26/2018: Hemoglobin 14.1; Platelets 324 08/04/2018: ALT 16; BNP 191.2; BUN 19; Creatinine, Ser 1.05; Potassium 4.2; Sodium 144   Recent Lipid Panel Lab Results  Component Value Date/Time   CHOL 226 (H) 06/12/2017 03:03 PM   CHOL 149 10/27/2012 10:28 AM   TRIG 93 12/10/2014 03:53 PM   TRIG 61 01/26/2013 09:27 AM   TRIG 72 10/27/2012 10:28 AM   HDL 88 06/12/2017 03:03 PM   HDL 76 01/26/2013 09:27 AM   HDL 59 10/27/2012 10:28 AM   CHOLHDL 2.5 12/10/2014 03:53 PM   LDLCALC 100 (H) 12/10/2014 03:53 PM   LDLCALC 98 01/26/2013 09:27 AM   LDLCALC 76 10/27/2012 10:28 AM   LDLDIRECT 135 (H) 06/12/2017 03:03 PM    Wt Readings from Last 3 Encounters:  08/27/18 160 lb (72.6 kg)  08/21/18 162 lb (73.5 kg)  08/04/18 162 lb (73.5 kg)     Objective:    Vital Signs:  Ht 5\' 4"  (1.626 m)   Wt 160 lb (72.6 kg)   BMI 27.46 kg/m    VITAL SIGNS:  reviewed NA  ASSESSMENT & PLAN:     ACUTE ON CHRONIC SYSTOLIC AND DIASTOLIC HF:    There was some mention of some fluid overload.  However, she thinks she is breathing better now.  Her ankle swelling went down.  She had a diuretic suggested for a day.  At this point no change in therapy is indicated.  HTN:  The blood pressure is at target. No change in medications is indicated.  We will continue with therapeutic lifestyle changes (TLC).  I reviewed recent readings in the office and they have all been fine.  These were checked in April and March.  CAROTID  DOPPLER:   She had less than 50% stenosis in March.  She will have this in 1 year follow-up.   TOBACCO ABUSE:   She still smoking but she knows she needs to quit.  DYSLIPIDEMIA:  Her LDL last month was 135.  She needs follow-up lipids and I will arrange that in 2 months.  COVID-19 Education: The signs and symptoms of COVID-19 were discussed with the patient and how to seek care for testing (follow up with PCP or arrange E-visit).  The importance of social distancing was discussed today.  Time:   Today, I have spent 16 minutes with the patient with telehealth technology discussing the above problems.     Medication Adjustments/Labs and Tests Ordered: Current medicines are reviewed at length with the patient today.  Concerns regarding medicines are outlined above.   Tests Ordered: No orders of the defined types were placed in this encounter.   Medication Changes: No orders of the defined types were placed in this encounter.   Disposition:  Follow up in year year(s)  Signed, Rollene Rotunda, MD  08/27/2018 3:10 PM    [facility]

## 2018-08-27 ENCOUNTER — Telehealth: Payer: Self-pay | Admitting: *Deleted

## 2018-08-27 ENCOUNTER — Telehealth (INDEPENDENT_AMBULATORY_CARE_PROVIDER_SITE_OTHER): Payer: Medicare Other | Admitting: Cardiology

## 2018-08-27 ENCOUNTER — Encounter: Payer: Self-pay | Admitting: Cardiology

## 2018-08-27 VITALS — Ht 64.0 in | Wt 160.0 lb

## 2018-08-27 DIAGNOSIS — E785 Hyperlipidemia, unspecified: Secondary | ICD-10-CM

## 2018-08-27 DIAGNOSIS — I5043 Acute on chronic combined systolic (congestive) and diastolic (congestive) heart failure: Secondary | ICD-10-CM

## 2018-08-27 DIAGNOSIS — I6523 Occlusion and stenosis of bilateral carotid arteries: Secondary | ICD-10-CM

## 2018-08-27 NOTE — Telephone Encounter (Signed)
I attempted to contact pt's daughter [name] as requested after pt's visit today.  The number listed rings to what sounds like a fax machine.  Dr Antoine Poche ordered pt to have fasting lipid panel in 2 months at her PCP office and to f/u with him in 1 year after a carotid doppler.  Orders placed for all of this.  AVS sent to pt's MyChart portal.

## 2018-08-27 NOTE — Patient Instructions (Signed)
Medication Instructions:  The current medical regimen is effective;  continue present plan and medications.  If you need a refill on your cardiac medications before your next appointment, please call your pharmacy.   Lab work: Please have blood work in 2 months at your primary care doctors office (Lipid) If you have labs (blood work) drawn today and your tests are completely normal, you will receive your results only by: Marland Kitchen MyChart Message (if you have MyChart) OR . A paper copy in the mail If you have any lab test that is abnormal or we need to change your treatment, we will call you to review the results.  Testing/Procedures: Your physician has requested that you have a carotid duplex in 1 year.  This test is an ultrasound of the carotid arteries in your neck. It looks at blood flow through these arteries that supply the brain with blood. Allow one hour for this exam. There are no restrictions or special instructions.  Follow-Up: Follow up in 1 year with Dr. Antoine Poche in [place].  You will receive a letter in the mail 2 months before you are due.  Please call us when you receive this letter to schedule your follow up appointment.  Thank you for choosing [facility]!!

## 2018-09-01 ENCOUNTER — Telehealth: Payer: Self-pay | Admitting: Cardiology

## 2018-09-01 NOTE — Telephone Encounter (Signed)
Spoke with pt's daughter re last visit and instructed on Dr Hochrein's recommendations and is aware Also daughter is with pt at this time and notes small amount of swelling to left ankle Informed daughter to continue to monitor and weigh daily if notes 3 # weight gain in 24 hour period to call office or 5 # weight in 7 days Also have pt elevate legs as much as possible and watch salt intake and if worsens to call office back Daughter verbalizes understanding .Zack Seal

## 2018-09-01 NOTE — Telephone Encounter (Signed)
New message   Patient's daughter would like to go over the notes from the visit with Dr. Antoine Poche on 08/27/2018. Please call to discuss.

## 2018-09-04 ENCOUNTER — Encounter: Payer: Self-pay | Admitting: Cardiology

## 2018-09-08 ENCOUNTER — Ambulatory Visit: Payer: Self-pay | Admitting: Pulmonary Disease

## 2018-09-15 ENCOUNTER — Other Ambulatory Visit: Payer: Self-pay | Admitting: Family Medicine

## 2018-09-15 ENCOUNTER — Other Ambulatory Visit: Payer: Self-pay | Admitting: Pediatrics

## 2018-09-15 DIAGNOSIS — I10 Essential (primary) hypertension: Secondary | ICD-10-CM

## 2018-09-16 ENCOUNTER — Other Ambulatory Visit: Payer: Self-pay | Admitting: *Deleted

## 2018-09-16 MED ORDER — GABAPENTIN 400 MG PO CAPS: ORAL_CAPSULE | ORAL | 0 refills | Status: DC

## 2018-09-16 MED ORDER — LORATADINE 10 MG PO TABS: 10.0000 mg | ORAL_TABLET | Freq: Every day | ORAL | 5 refills | Status: DC

## 2018-09-18 ENCOUNTER — Other Ambulatory Visit: Payer: Self-pay | Admitting: *Deleted

## 2018-09-18 DIAGNOSIS — I6523 Occlusion and stenosis of bilateral carotid arteries: Secondary | ICD-10-CM

## 2018-09-18 MED ORDER — PRAVASTATIN SODIUM 80 MG PO TABS: 80.0000 mg | ORAL_TABLET | Freq: Every day | ORAL | 1 refills | Status: DC

## 2018-09-18 NOTE — Telephone Encounter (Signed)
Fax from [facility] pharmacy Request 2 wk RF on Pravastatin to [facility] in [place] while staying with daughter. Has active lab order from Dr. Antoine Poche Please advise

## 2018-09-24 ENCOUNTER — Other Ambulatory Visit: Payer: Self-pay | Admitting: *Deleted

## 2018-09-24 DIAGNOSIS — J449 Chronic obstructive pulmonary disease, unspecified: Secondary | ICD-10-CM

## 2018-09-24 MED ORDER — BUDESONIDE-FORMOTEROL FUMARATE 160-4.5 MCG/ACT IN AERO: 2.0000 | INHALATION_SPRAY | Freq: Two times a day (BID) | RESPIRATORY_TRACT | 1 refills | Status: DC

## 2018-09-29 ENCOUNTER — Other Ambulatory Visit: Payer: Self-pay

## 2018-09-30 ENCOUNTER — Ambulatory Visit (INDEPENDENT_AMBULATORY_CARE_PROVIDER_SITE_OTHER): Payer: Medicare Other | Admitting: Family Medicine

## 2018-09-30 ENCOUNTER — Encounter: Payer: Self-pay | Admitting: Family Medicine

## 2018-09-30 ENCOUNTER — Other Ambulatory Visit: Payer: Self-pay | Admitting: Family Medicine

## 2018-09-30 VITALS — BP 136/72 | HR 68 | Temp 97.3°F | Ht 64.0 in | Wt 160.4 lb

## 2018-09-30 DIAGNOSIS — I6523 Occlusion and stenosis of bilateral carotid arteries: Secondary | ICD-10-CM | POA: Diagnosis not present

## 2018-09-30 DIAGNOSIS — N951 Menopausal and female climacteric states: Secondary | ICD-10-CM | POA: Insufficient documentation

## 2018-09-30 DIAGNOSIS — F03A0 Unspecified dementia, mild, without behavioral disturbance, psychotic disturbance, mood disturbance, and anxiety: Secondary | ICD-10-CM

## 2018-09-30 DIAGNOSIS — I10 Essential (primary) hypertension: Secondary | ICD-10-CM | POA: Diagnosis not present

## 2018-09-30 DIAGNOSIS — I5032 Chronic diastolic (congestive) heart failure: Secondary | ICD-10-CM | POA: Diagnosis not present

## 2018-09-30 DIAGNOSIS — E782 Mixed hyperlipidemia: Secondary | ICD-10-CM | POA: Diagnosis not present

## 2018-09-30 DIAGNOSIS — F0390 Unspecified dementia without behavioral disturbance: Secondary | ICD-10-CM

## 2018-09-30 DIAGNOSIS — J449 Chronic obstructive pulmonary disease, unspecified: Secondary | ICD-10-CM

## 2018-09-30 DIAGNOSIS — M5481 Occipital neuralgia: Secondary | ICD-10-CM

## 2018-09-30 LAB — LIPID PANEL

## 2018-09-30 MED ORDER — MEMANTINE HCL 10 MG PO TABS: 10.0000 mg | ORAL_TABLET | Freq: Two times a day (BID) | ORAL | 3 refills | Status: DC

## 2018-09-30 MED ORDER — GABAPENTIN 100 MG PO CAPS: 100.0000 mg | ORAL_CAPSULE | Freq: Three times a day (TID) | ORAL | 3 refills | Status: DC

## 2018-09-30 NOTE — Progress Notes (Signed)
Subjective:  Patient ID: [name], female    DOB: [birth year], 84 y.o.   MRN: [id number]  Chief Complaint:  Medical Management of Chronic Issues (three month recheck)   HPI: [name] is a 84 y.o. female presenting on 09/30/2018 for Medical Management of Chronic Issues (three month recheck)  [name] is in the office today. Her daughters, [name] and [name], are on speaker phone during visit. Pt states it is ok to conduct the visit with the family on the phone. Daughters were on pts speaker phone.   1. Essential hypertension  Complaint with meds - Yes Checking BP at home - No Exercising Regularly - No Watching Salt intake - Yes Pertinent ROS:  Headache - Yes, chronic headaches, no new symptoms Chest pain - No Dyspnea - No Palpitations - No LE edema - No They report good compliance with medications and can restate their regimen by memory. No medication side effects.  BP Readings from Last 3 Encounters:  09/30/18 136/72  08/04/18 138/72  07/03/18 (!) 103/50     2. Chronic diastolic heart failure (HCC)  Pt denies leg swelling, weight changes, dyspnea, cough, orthopnea, PND, palpitations, or fatigue.  Compliant with medications without associated side effects.    3. Bilateral carotid artery stenosis  Pt denies dizziness, syncope, changes in chronic headaches, or focal deficits. Last carotid US 2019, will repeat in 2021.   4. Mixed hyperlipidemia  Compliant with medications. No arthralgias or myalgias. Does try to watch diet. Does not exercise.    5. Mild dementia [facility])  Daughters feel her dementia is progressing. States she is getting more forgetful. Pt states she will go to the kitchen and forget what she went to the kitchen for. States this is becoming more frequent. Pt states she has help from her daughters. States they help with her bills. She is able to cook for self and perform ADLs. She states she is taking her Namenda. No agitation or wondering.    6.  Hot flashes due to menopause  Daughters are concerned about pts hot flashes. States she has several per day. Pt has been staying with daughter in [place] for the past month. Daughter states she noticed an increase in the hot flashes and would like her hormone levels checked. Pt states she does have hot flashes, states they are worse at night. No vaginal bleeding, pain, or discharge. Denies vaginal irritation.    7. Occipital neuralgia of left side  Ongoing left sided headache. Has been on gabapentin for a while. Daughters are concerned about her dose of gabapentin. States they have noticed fatigue and unsteady gait. States they are concerned the medication is causing this fatigue and unsteady gait. They are concerned she may fall and get hurt. Pt and daughters are willing to decrease dose and potentially stop all together. She has a follow up appointment with neurology and will discuss further at that appointment.      Relevant past medical, surgical, family, and social history reviewed and updated as indicated.  Allergies and medications reviewed and updated.   Past Medical History:  Diagnosis Date  . Allergic rhinitis   . Anxiety   . Arthritis   . Asthma   . CAP (community acquired pneumonia) 11/26/2017  . Depression   . Dyspnea    with exertion   . Elevated troponin 04/05/2016  . Essential hypertension, benign   . GERD (gastroesophageal reflux disease)   . Headache    occiptal neuralgia -  left   . Heart murmur   . History of blood transfusion    "related to miscarriage"  . Left bundle branch block    in & out of A fib  . Memory loss   . Mixed hyperlipidemia   . Occipital neuralgia of left side   . Osteopenia   . 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 Urinary urgency     Past Surgical History:  Procedure Laterality Date  . APPENDECTOMY    . BACK SURGERY    . CARDIAC CATHETERIZATION  2010   Normal coronaries  . CATARACT EXTRACTION W/PHACO   04/16/2011   Procedure: CATARACT EXTRACTION PHACO AND INTRAOCULAR LENS PLACEMENT (IOC);  Surgeon: Williams Che;  Location: [facility];  Service: Ophthalmology;  Laterality: Left;  CDE=11.35  . CATARACT EXTRACTION W/PHACO  01/28/2012   Procedure: CATARACT EXTRACTION PHACO AND INTRAOCULAR LENS PLACEMENT (IOC);  Surgeon: Williams Che, MD;  Location: [facility];  Service: Ophthalmology;  Laterality: Right;  CDI:8.15  . CYSTO WITH HYDRODISTENSION  05/29/2012   Procedure: CYSTOSCOPY/HYDRODISTENSION;  Surgeon: Reece Packer, MD;  Location: [facility];  Service: Urology;  Laterality: N/A;  INSTILLATION OF MARCAINE AND PYRIDIUM   . DILATION AND CURETTAGE OF UTERUS  <hysterectomy"  . EYE SURGERY    . HAMMER TOE SURGERY Bilateral 11/22/11   [place] [place]  . JOINT REPLACEMENT    . LAPAROSCOPIC CHOLECYSTECTOMY  1991  . LUMBAR LAMINECTOMY  2011  . LUMBAR LAMINECTOMY/DECOMPRESSION MICRODISCECTOMY Left 06/06/2012   Procedure: LUMBAR LAMINECTOMY/DECOMPRESSION MICRODISCECTOMY 1 LEVEL;  Surgeon: Erline Levine, MD;  Location: [place] [facility];  Service: Neurosurgery;  Laterality: Left;  Left Lumbar two-three Laminectomy for resection of synovial cyst  . SHOULDER ARTHROSCOPY Right 02/12/2018   SHOULDER ARTHROSCOPY, BICEPS DEBRIDEMENT, REMOVAL OF LOOSE BODIES  . SHOULDER ARTHROSCOPY Right 02/12/2018   Procedure: RIGHT SHOULDER ARTHROSCOPY, BICEPS DEBRIDEMENT, REMOVAL OF LOOSE BODIES;  Surgeon: Marybelle Killings, MD;  Location: [place];  Service: Orthopedics;  Laterality: Right;  . SHOULDER OPEN ROTATOR CUFF REPAIR Right 2011  . TONSILLECTOMY    . TOTAL HIP ARTHROPLASTY Right 03/13/2013   Procedure: TOTAL HIP ARTHROPLASTY;  Surgeon: Kerin Salen, MD;  Location: [place];  Service: Orthopedics;  Laterality: Right;  . TOTAL SHOULDER ARTHROPLASTY Right 07/02/2018   Procedure: RIGHT TOTAL SHOULDER ARTHROPLASTY;  Surgeon: Marybelle Killings, MD;  Location: [place];  Service: Orthopedics;  Laterality: Right;  Marland Kitchen VAGINAL  HYSTERECTOMY      Social History   Socioeconomic History  . Marital status: Widowed    Spouse name: Not on file  . Number of children: 5  . Years of education: 8th grade  . Highest education level: Not on file  Occupational History  . Occupation: Worked in Holiday representative for 30 years    Employer: RETIRED    Comment: Retired  . Occupation: Part time care giver  Social Needs  . Financial resource strain: Somewhat hard  . Food insecurity:    Worry: Sometimes true    Inability: Never true  . Transportation needs:    Medical: No    Non-medical: No  Tobacco Use  . Smoking status: Former Smoker    Years: 60.00    Types: Cigarettes    Last attempt to quit: 09/12/2018    Years since quitting: 0.0  . Smokeless tobacco: Never Used  Substance and Sexual Activity  . Alcohol use: Not Currently    Comment: 11/27/2017 "drank some in  my 20s"  . Drug use: No  . Sexual activity: Not Currently  Lifestyle  . Physical activity:    Days per week: 0 days    Minutes per session: 0 min  . Stress: Only a little  Relationships  . Social connections:    Talks on phone: More than three times a week    Gets together: More than three times a week    Attends religious service: More than 4 times per year    Active member of club or organization: Yes    Attends meetings of clubs or organizations: More than 4 times per year    Relationship status: Widowed  . Intimate partner violence:    Fear of current or ex partner: No    Emotionally abused: No    Physically abused: No    Forced sexual activity: No  Other Topics Concern  . Not on file  Social History Narrative   Ms Buch is divorced. Her adult son lives with her and he is disabled due to bilateral AKA due to diabetes.  She helps to provide care to him. She has three adult daughters that live in the [place] area. She talks with them daily and they come to visit once or twice a year. She has 7 grandchildren also.     Outpatient Encounter  Medications as of 09/30/2018  Medication Sig  . albuterol (PROVENTIL HFA;VENTOLIN HFA) 108 (90 Base) MCG/ACT inhaler Inhale 1-2 puffs into the lungs every 6 (six) hours as needed for wheezing or shortness of breath.  Marland Kitchen albuterol (PROVENTIL) (2.5 MG/3ML) 0.083% nebulizer solution   . aspirin EC 81 MG tablet Take 81 mg by mouth every evening.   Marland Kitchen azelastine (ASTELIN) 0.1 % nasal spray Place 2 sprays into both nostrils 2 (two) times daily. Use in each nostril as directed  . Biotin 5 MG TABS Take 5 mg by mouth every morning.   . budesonide-formoterol (SYMBICORT) 160-4.5 MCG/ACT inhaler Inhale 2 puffs into the lungs 2 (two) times daily.  Marland Kitchen buPROPion (WELLBUTRIN SR) 150 MG 12 hr tablet TAKE (1) TABLET TWICE A DAY. (Patient taking differently: Take 150 mg by mouth 2 (two) times daily. )  . carvedilol (COREG) 6.25 MG tablet TAKE  (1)  TABLET TWICE A DAY WITH MEALS (BREAKFAST AND SUPPER) (Patient taking differently: Take 6.25 mg by mouth 2 (two) times daily with a meal. )  . esomeprazole (NEXIUM) 40 MG capsule Take 1 capsule (40 mg total) by mouth daily before breakfast.  . furosemide (LASIX) 20 MG tablet TAKE 2 TABLETS DAILY FOR 2 DAYS, THEN DECREASE BACK TO 1 DAILY  . HYDROcodone-acetaminophen (NORCO) 7.5-325 MG tablet Take 1 tablet by mouth every 6 (six) hours as needed for moderate pain. post op pain  . loratadine (CLARITIN) 10 MG tablet Take 1 tablet (10 mg total) by mouth daily.  Marland Kitchen losartan (COZAAR) 50 MG tablet TAKE (1/2) TABLET DAILY.  . Multiple Vitamins-Minerals (CENTRUM SILVER PO) Take 1 tablet by mouth every morning.   . ondansetron (ZOFRAN) 4 MG tablet Take 1 tablet (4 mg total) by mouth every 8 (eight) hours as needed for nausea or vomiting.  . potassium chloride [name] (K-DUR,KLOR-CON) 20 MEQ tablet Take 1 tablet (20 mEq total) by mouth daily. (Patient taking differently: Take 20 mEq by mouth every morning. )  . pravastatin (PRAVACHOL) 80 MG tablet Take 1 tablet (80 mg total) by mouth daily.  .  raloxifene (EVISTA) 60 MG tablet TAKE 1 TABLET DAILY (Patient taking differently: Take  60 mg by mouth daily. )  . Spacer/Aero-Holding Chambers (AEROCHAMBER MV) inhaler Use as instructed  . [DISCONTINUED] gabapentin (NEURONTIN) 400 MG capsule TAKE (1) CAPSULE THREE TIMES DAILY.  . [DISCONTINUED] memantine (NAMENDA) 5 MG tablet Take 1 tablet (5 mg total) by mouth 2 (two) times daily.  Marland Kitchen gabapentin (NEURONTIN) 100 MG capsule Take 1 capsule (100 mg total) by mouth 3 (three) times daily.  . memantine (NAMENDA) 10 MG tablet Take 1 tablet (10 mg total) by mouth 2 (two) times daily for 30 days.   No facility-administered encounter medications on file as of 09/30/2018.     Allergies  Allergen Reactions  . Lisinopril-Hydrochlorothiazide Swelling, Other (See Comments) and Cough    Lip swelling and cough. Stopped by ENT. Patient is on Losartan without any issues   . Codeine Itching and Swelling    eye irritation (prescribed percocet)  . Penicillins Itching and Swelling    PATIENT HAS HAD A PCN REACTION WITH IMMEDIATE RASH, FACIAL/TONGUE/THROAT SWELLING, SOB, OR LIGHTHEADEDNESS WITH HYPOTENSION:  #  #  YES  #  #  Has patient had a PCN reaction causing severe rash involving mucus membranes or skin necrosis: No Has patient had a PCN reaction that required hospitalization No Has patient had a PCN reaction occurring within the last 10 years: No eye irritation  . Aricept [Donepezil Hcl] Nausea And Vomiting  . Namenda [Memantine Hcl] Nausea And Vomiting    Review of Systems  Constitutional: Positive for diaphoresis (hot flashes with diaphoresis) and fatigue. Negative for activity change, appetite change, chills, fever and unexpected weight change.  Eyes: Negative for photophobia and visual disturbance.  Respiratory: Negative for cough, chest tightness, shortness of breath and wheezing.   Cardiovascular: Negative for chest pain, palpitations and leg swelling.  Gastrointestinal: Negative for abdominal pain,  anal bleeding, blood in stool, constipation, diarrhea, nausea and vomiting.  Endocrine: Negative for polydipsia, polyphagia and polyuria.  Genitourinary: Negative for decreased urine volume, difficulty urinating, vaginal bleeding, vaginal discharge and vaginal pain.  Musculoskeletal: Positive for gait problem (unsteady). Negative for arthralgias and myalgias.  Neurological: Positive for headaches. Negative for dizziness, tremors, seizures, syncope, facial asymmetry, speech difficulty, weakness, light-headedness and numbness.  Hematological: Does not bruise/bleed easily.  Psychiatric/Behavioral: Positive for confusion (history of dementia). Negative for agitation and sleep disturbance.  All other systems reviewed and are negative.       Objective:  BP 136/72   Pulse 68   Temp (!) 97.3 F (36.3 C) (Oral)   Ht 5' 4"  (1.626 m)   Wt 160 lb 6.4 oz (72.8 kg)   BMI 27.53 kg/m    Wt Readings from Last 3 Encounters:  09/30/18 160 lb 6.4 oz (72.8 kg)  08/27/18 160 lb (72.6 kg)  08/21/18 162 lb (73.5 kg)    Physical Exam Vitals signs and nursing note reviewed.  Constitutional:      General: She is not in acute distress.    Appearance: Normal appearance. She is well-developed and well-groomed. She is not ill-appearing, toxic-appearing or diaphoretic.  HENT:     Head: Normocephalic and atraumatic.     Jaw: There is normal jaw occlusion.     Right Ear: Hearing normal.     Left Ear: Hearing normal.     Nose: Nose normal.     Mouth/Throat:     Lips: Pink.     Mouth: Mucous membranes are moist.     Pharynx: Oropharynx is clear. Uvula midline.  Eyes:     General: Lids  are normal.     Extraocular Movements: Extraocular movements intact.     Conjunctiva/sclera: Conjunctivae normal.     Pupils: Pupils are equal, round, and reactive to light.  Neck:     Musculoskeletal: Normal range of motion and neck supple.     Thyroid: No thyroid mass, thyromegaly or thyroid tenderness.     Vascular:  No carotid bruit or JVD.     Trachea: Trachea and phonation normal.  Cardiovascular:     Rate and Rhythm: Normal rate and regular rhythm.     Chest Wall: PMI is not displaced.     Pulses: Normal pulses.     Heart sounds: Normal heart sounds. No murmur. No friction rub. No gallop.   Pulmonary:     Effort: Pulmonary effort is normal. No respiratory distress.     Breath sounds: Normal breath sounds. No wheezing.  Abdominal:     General: Bowel sounds are normal. There is no distension or abdominal bruit.     Palpations: Abdomen is soft. There is no hepatomegaly or splenomegaly.     Tenderness: There is no abdominal tenderness. There is no right CVA tenderness or left CVA tenderness.     Hernia: No hernia is present.  Musculoskeletal: Normal range of motion.     Right lower leg: No edema.     Left lower leg: No edema.  Lymphadenopathy:     Cervical: No cervical adenopathy.  Skin:    General: Skin is warm and dry.     Capillary Refill: Capillary refill takes less than 2 seconds.     Coloration: Skin is not cyanotic, jaundiced or pale.     Findings: No rash.  Neurological:     General: No focal deficit present.     Mental Status: She is alert and oriented to person, place, and time.     Cranial Nerves: Cranial nerves are intact.     Sensory: Sensation is intact.     Motor: Motor function is intact.     Coordination: Coordination is intact.     Gait: Gait abnormal (unsteady, slow).     Deep Tendon Reflexes: Reflexes are normal and symmetric.  Psychiatric:        Attention and Perception: Attention and perception normal.        Mood and Affect: Mood and affect normal.        Speech: Speech normal.        Behavior: Behavior normal. Behavior is cooperative.        Thought Content: Thought content normal.        Cognition and Memory: Cognition normal. Memory is impaired.        Judgment: Judgment normal.     Results for orders placed or performed in visit on 08/04/18  Brain  natriuretic peptide  Result Value Ref Range   BNP 191.2 (H) 0.0 - 100.0 pg/mL  CMP14+EGFR  Result Value Ref Range   Glucose 103 (H) 65 - 99 mg/dL   BUN 19 8 - 27 mg/dL   Creatinine, Ser 1.05 (H) 0.57 - 1.00 mg/dL   GFR calc non Af Amer 50 (L) >59 mL/min/1.73   GFR calc Af Amer 58 (L) >59 mL/min/1.73   BUN/Creatinine Ratio 18 12 - 28   Sodium 144 134 - 144 mmol/L   Potassium 4.2 3.5 - 5.2 mmol/L   Chloride 106 96 - 106 mmol/L   CO2 23 20 - 29 mmol/L   Calcium 9.5 8.7 - 10.3 mg/dL   Total  Protein 6.7 6.0 - 8.5 g/dL   Albumin 3.9 3.6 - 4.6 g/dL   Globulin, Total 2.8 1.5 - 4.5 g/dL   Albumin/Globulin Ratio 1.4 1.2 - 2.2   Bilirubin Total <0.2 0.0 - 1.2 mg/dL   Alkaline Phosphatase 152 (H) 39 - 117 IU/L   AST 21 0 - 40 IU/L   ALT 16 0 - 32 IU/L  Uric Acid  Result Value Ref Range   Uric Acid 5.4 2.5 - 7.1 mg/dL       Pertinent labs & imaging results that were available during my care of the patient were reviewed by me and considered in my medical decision making.  Assessment & Plan:  Jonet was seen today for medical management of chronic issues.  Diagnoses and all orders for this visit:  Essential hypertension Stable. Diet and exercise encouraged. Labs pending. Continue medications as prescribed.  -     CMP14+EGFR -     CBC with Differential/Platelet  Chronic diastolic heart failure (HCC) No increased dyspnea, swelling, PND, orthopnea, or weight gain. Labs pending. Continue medications as prescribed.  -     CMP14+EGFR  Bilateral carotid artery stenosis Due to carotid US in 2021.  Mixed hyperlipidemia Det and exercise encouraged. Labs pending. Continue medications as prescribed.  -     Lipid panel  Mild dementia (HCC) Increasing symptoms. Will increase Namenda to 10 mg. Report any new or worsening symptoms or intolerance to medications.  -     memantine (NAMENDA) 10 MG tablet; Take 1 tablet (10 mg total) by mouth 2 (two) times daily for 30 days.  Hot flashes due to  menopause Pt declines intervention at this time. Labs pending. Report any new or worsening symptoms.  -     TSH -     Estrogens, Total -     17-Hydroxyprogesterone -     Testosterone  Occipital neuralgia of left side Ongoing. Has upcoming appointment with neurology. Will decrease gabapentin today.  -     gabapentin (NEURONTIN) 100 MG capsule; Take 1 capsule (100 mg total) by mouth 3 (three) times daily.     Continue all other maintenance medications.  Follow up plan: Return in about 1 month (around 10/30/2018), or if symptoms worsen or fail to improve.  Total time spent with patient 50 minutes  Greater than 50% of encounter spent in coordination of care/counseling.   The above assessment and management plan was discussed with the patient. The patient verbalized understanding of and has agreed to the management plan. Patient is aware to call the clinic if symptoms persist or worsen. Patient is aware when to return to the clinic for a follow-up visit. Patient educated on when it is appropriate to go to the emergency department.   Monia Pouch, FNP-C [facility] [phone]

## 2018-10-01 MED ORDER — BUDESONIDE-FORMOTEROL FUMARATE 160-4.5 MCG/ACT IN AERO: INHALATION_SPRAY | RESPIRATORY_TRACT | 0 refills | Status: DC

## 2018-10-01 NOTE — Telephone Encounter (Signed)
Refill failed, resent 

## 2018-10-01 NOTE — Addendum Note (Signed)
Addended by: Julious Payer D on: 10/01/2018 12:09 PM   Modules accepted: Orders

## 2018-10-04 ENCOUNTER — Inpatient Hospital Stay (HOSPITAL_COMMUNITY)
Admission: EM | Admit: 2018-10-04 | Discharge: 2018-10-06 | DRG: 202 | Disposition: A | Discharge status: Home Health | Payer: Medicare Other | Attending: Internal Medicine | Admitting: Internal Medicine

## 2018-10-04 ENCOUNTER — Emergency Department (HOSPITAL_COMMUNITY): Payer: Medicare Other

## 2018-10-04 ENCOUNTER — Other Ambulatory Visit: Payer: Self-pay

## 2018-10-04 ENCOUNTER — Encounter (HOSPITAL_COMMUNITY): Payer: Self-pay | Admitting: Emergency Medicine

## 2018-10-04 DIAGNOSIS — R0902 Hypoxemia: Secondary | ICD-10-CM | POA: Diagnosis present

## 2018-10-04 DIAGNOSIS — R778 Other specified abnormalities of plasma proteins: Secondary | ICD-10-CM | POA: Diagnosis present

## 2018-10-04 DIAGNOSIS — R9431 Abnormal electrocardiogram [ECG] [EKG]: Secondary | ICD-10-CM | POA: Diagnosis not present

## 2018-10-04 DIAGNOSIS — Z9981 Dependence on supplemental oxygen: Secondary | ICD-10-CM | POA: Diagnosis not present

## 2018-10-04 DIAGNOSIS — J45901 Unspecified asthma with (acute) exacerbation: Secondary | ICD-10-CM | POA: Diagnosis present

## 2018-10-04 DIAGNOSIS — J441 Chronic obstructive pulmonary disease with (acute) exacerbation: Secondary | ICD-10-CM | POA: Diagnosis not present

## 2018-10-04 DIAGNOSIS — I5032 Chronic diastolic (congestive) heart failure: Secondary | ICD-10-CM | POA: Diagnosis present

## 2018-10-04 DIAGNOSIS — F0390 Unspecified dementia without behavioral disturbance: Secondary | ICD-10-CM | POA: Diagnosis present

## 2018-10-04 DIAGNOSIS — I248 Other forms of acute ischemic heart disease: Secondary | ICD-10-CM | POA: Diagnosis present

## 2018-10-04 DIAGNOSIS — J449 Chronic obstructive pulmonary disease, unspecified: Secondary | ICD-10-CM | POA: Diagnosis present

## 2018-10-04 DIAGNOSIS — Z96641 Presence of right artificial hip joint: Secondary | ICD-10-CM | POA: Diagnosis present

## 2018-10-04 DIAGNOSIS — E782 Mixed hyperlipidemia: Secondary | ICD-10-CM | POA: Diagnosis present

## 2018-10-04 DIAGNOSIS — Z8249 Family history of ischemic heart disease and other diseases of the circulatory system: Secondary | ICD-10-CM

## 2018-10-04 DIAGNOSIS — Z833 Family history of diabetes mellitus: Secondary | ICD-10-CM

## 2018-10-04 DIAGNOSIS — I779 Disorder of arteries and arterioles, unspecified: Secondary | ICD-10-CM | POA: Diagnosis present

## 2018-10-04 DIAGNOSIS — Z7982 Long term (current) use of aspirin: Secondary | ICD-10-CM

## 2018-10-04 DIAGNOSIS — I5022 Chronic systolic (congestive) heart failure: Secondary | ICD-10-CM | POA: Diagnosis present

## 2018-10-04 DIAGNOSIS — K219 Gastro-esophageal reflux disease without esophagitis: Secondary | ICD-10-CM | POA: Diagnosis present

## 2018-10-04 DIAGNOSIS — Z96611 Presence of right artificial shoulder joint: Secondary | ICD-10-CM | POA: Diagnosis present

## 2018-10-04 DIAGNOSIS — R0603 Acute respiratory distress: Secondary | ICD-10-CM | POA: Diagnosis present

## 2018-10-04 DIAGNOSIS — I10 Essential (primary) hypertension: Secondary | ICD-10-CM | POA: Diagnosis present

## 2018-10-04 DIAGNOSIS — Z7951 Long term (current) use of inhaled steroids: Secondary | ICD-10-CM

## 2018-10-04 DIAGNOSIS — Z87891 Personal history of nicotine dependence: Secondary | ICD-10-CM | POA: Diagnosis not present

## 2018-10-04 DIAGNOSIS — E785 Hyperlipidemia, unspecified: Secondary | ICD-10-CM | POA: Diagnosis present

## 2018-10-04 DIAGNOSIS — R7989 Other specified abnormal findings of blood chemistry: Secondary | ICD-10-CM | POA: Diagnosis not present

## 2018-10-04 DIAGNOSIS — I6529 Occlusion and stenosis of unspecified carotid artery: Secondary | ICD-10-CM | POA: Diagnosis present

## 2018-10-04 DIAGNOSIS — I110 Hypertensive heart disease with heart failure: Secondary | ICD-10-CM | POA: Diagnosis present

## 2018-10-04 DIAGNOSIS — F329 Major depressive disorder, single episode, unspecified: Secondary | ICD-10-CM | POA: Diagnosis present

## 2018-10-04 DIAGNOSIS — Z20828 Contact with and (suspected) exposure to other viral communicable diseases: Secondary | ICD-10-CM | POA: Diagnosis present

## 2018-10-04 HISTORY — DX: Chronic obstructive pulmonary disease, unspecified: J44.9

## 2018-10-04 LAB — BASIC METABOLIC PANEL
Anion gap: 9 (ref 5–15)
BUN: 22 mg/dL (ref 8–23)
CO2: 25 mmol/L (ref 22–32)
Calcium: 9.2 mg/dL (ref 8.9–10.3)
Chloride: 110 mmol/L (ref 98–111)
Creatinine, Ser: 1.07 mg/dL — ABNORMAL HIGH (ref 0.44–1.00)
GFR calc Af Amer: 56 mL/min — ABNORMAL LOW (ref >60)
GFR calc non Af Amer: 49 mL/min — ABNORMAL LOW (ref >60)
Glucose, Bld: 147 mg/dL — ABNORMAL HIGH (ref 70–99)
Potassium: 3.6 mmol/L (ref 3.5–5.1)
Sodium: 144 mmol/L (ref 135–145)

## 2018-10-04 LAB — CBC WITH DIFFERENTIAL/PLATELET
Abs Immature Granulocytes: 0.02 10*3/uL (ref 0.00–0.07)
Basophils Absolute: 0 10*3/uL (ref 0.0–0.2)
Basophils Absolute: 0 10*3/uL (ref 0.0–0.1)
Basophils Relative: 1 %
Basos: 1 %
EOS (ABSOLUTE): 0.3 10*3/uL (ref 0.0–0.4)
Eos: 3 %
Eosinophils Absolute: 0.4 10*3/uL (ref 0.0–0.5)
Eosinophils Relative: 5 %
HCT: 42.6 % (ref 36.0–46.0)
Hematocrit: 44.6 % (ref 34.0–46.6)
Hemoglobin: 14.3 g/dL (ref 11.1–15.9)
Hemoglobin: 13.9 g/dL (ref 12.0–15.0)
Immature Grans (Abs): 0 10*3/uL (ref 0.0–0.1)
Immature Granulocytes: 0 %
Immature Granulocytes: 0 %
Lymphocytes Absolute: 2.1 10*3/uL (ref 0.7–3.1)
Lymphocytes Relative: 13 %
Lymphs Abs: 1 10*3/uL (ref 0.7–4.0)
Lymphs: 27 %
MCH: 29.9 pg (ref 26.6–33.0)
MCH: 30.2 pg (ref 26.0–34.0)
MCHC: 32.1 g/dL (ref 31.5–35.7)
MCHC: 32.6 g/dL (ref 30.0–36.0)
MCV: 93 fL (ref 79–97)
MCV: 92.4 fL (ref 80.0–100.0)
Monocytes Absolute: 0.4 10*3/uL (ref 0.1–0.9)
Monocytes Absolute: 0.3 10*3/uL (ref 0.1–1.0)
Monocytes Relative: 3 %
Monocytes: 6 %
Neutro Abs: 6.3 10*3/uL (ref 1.7–7.7)
Neutrophils Absolute: 4.9 10*3/uL (ref 1.4–7.0)
Neutrophils Relative %: 78 %
Neutrophils: 63 %
Platelets: 277 10*3/uL (ref 150–450)
Platelets: 258 10*3/uL (ref 150–400)
RBC: 4.79 x10E6/uL (ref 3.77–5.28)
RBC: 4.61 MIL/uL (ref 3.87–5.11)
RDW: 14.1 % (ref 11.7–15.4)
RDW: 14.8 % (ref 11.5–15.5)
WBC: 7.8 10*3/uL (ref 3.4–10.8)
WBC: 8.1 10*3/uL (ref 4.0–10.5)
nRBC: 0 % (ref 0.0–0.2)

## 2018-10-04 LAB — CMP14+EGFR
ALT: 19 [IU]/L (ref 0–32)
AST: 27 [IU]/L (ref 0–40)
Albumin/Globulin Ratio: 1.9 (ref 1.2–2.2)
Albumin: 4.3 g/dL (ref 3.6–4.6)
Alkaline Phosphatase: 105 [IU]/L (ref 39–117)
BUN/Creatinine Ratio: 13 (ref 12–28)
BUN: 15 mg/dL (ref 8–27)
Bilirubin Total: 0.2 mg/dL (ref 0.0–1.2)
CO2: 21 mmol/L (ref 20–29)
Calcium: 9.4 mg/dL (ref 8.7–10.3)
Chloride: 108 mmol/L — ABNORMAL HIGH (ref 96–106)
Creatinine, Ser: 1.16 mg/dL — ABNORMAL HIGH (ref 0.57–1.00)
GFR calc Af Amer: 51 mL/min/{1.73_m2} — ABNORMAL LOW
GFR calc non Af Amer: 44 mL/min/{1.73_m2} — ABNORMAL LOW
Globulin, Total: 2.3 g/dL (ref 1.5–4.5)
Glucose: 98 mg/dL (ref 65–99)
Potassium: 4.1 mmol/L (ref 3.5–5.2)
Sodium: 144 mmol/L (ref 134–144)
Total Protein: 6.6 g/dL (ref 6.0–8.5)

## 2018-10-04 LAB — BLOOD GAS, VENOUS
Acid-Base Excess: 0.8 mmol/L (ref 0.0–2.0)
Bicarbonate: 26.6 mmol/L (ref 20.0–28.0)
FIO2: 21
O2 Saturation: 82.3 %
Patient temperature: 98.6
pCO2, Ven: 49.5 mmHg (ref 44.0–60.0)
pH, Ven: 7.35 (ref 7.250–7.430)
pO2, Ven: 51.7 mmHg — ABNORMAL HIGH (ref 32.0–45.0)

## 2018-10-04 LAB — SARS CORONAVIRUS 2 BY RT PCR (HOSPITAL ORDER, PERFORMED IN ~~LOC~~ HOSPITAL LAB): SARS Coronavirus 2: NEGATIVE

## 2018-10-04 LAB — LIPID PANEL
Chol/HDL Ratio: 2.4 ratio (ref 0.0–4.4)
Cholesterol, Total: 176 mg/dL (ref 100–199)
HDL: 73 mg/dL (ref >39)
LDL Calculated: 80 mg/dL (ref 0–99)
Triglycerides: 117 mg/dL (ref 0–149)
VLDL Cholesterol Cal: 23 mg/dL (ref 5–40)

## 2018-10-04 LAB — TSH: TSH: 2.04 u[IU]/mL (ref 0.450–4.500)

## 2018-10-04 LAB — TESTOSTERONE: Testosterone: 24 ng/dL (ref 3–41)

## 2018-10-04 LAB — 17-HYDROXYPROGESTERONE: 17-Hydroxyprogesterone: 30 ng/dL

## 2018-10-04 LAB — BRAIN NATRIURETIC PEPTIDE: B Natriuretic Peptide: 100.7 pg/mL — ABNORMAL HIGH (ref 0.0–100.0)

## 2018-10-04 LAB — ESTROGENS, TOTAL: Estrogen: 135 pg/mL

## 2018-10-04 LAB — TROPONIN I: Troponin I: 0.07 ng/mL (ref <0.03)

## 2018-10-04 MED ORDER — ALBUTEROL SULFATE HFA 108 (90 BASE) MCG/ACT IN AERS
4.0000 | INHALATION_SPRAY | Freq: Once | RESPIRATORY_TRACT | Status: AC
  Administered 2018-10-04: 4 via RESPIRATORY_TRACT
  Filled 2018-10-04: qty 6.7

## 2018-10-04 MED ORDER — ASPIRIN 81 MG PO CHEW
324.0000 mg | CHEWABLE_TABLET | Freq: Once | ORAL | Status: AC
  Administered 2018-10-04: 324 mg via ORAL
  Filled 2018-10-04: qty 4

## 2018-10-04 NOTE — ED Notes (Signed)
Bed: WA20 Expected date:  Expected time:  Means of arrival:  Comments: EMS [facility] 84 yo female from home,SOB, tight and wheezing-3 duonebs, Mag 2 grams, Solumedrol 125 mg IV BP 163/94 RR 21 O2 sat 99% on duonebs-MP ST with LBBB Temp 97.2

## 2018-10-04 NOTE — H&P (Signed)
[name] KGS:[id number] DOB: April 25, 1935 DOA: 10/04/2018     PCP: Sonny Masters, FNP   Outpatient Specialists:   CARDS:   Dr.Hochrein  Patient arrived to ER on 10/04/18 at 2024  Patient coming from: home Lives  With family with son who is paralyzed     Chief Complaint:  Chief Complaint  Patient presents with   Shortness of Breath    HPI: [name] is a 84 y.o. female with medical history significant of HTN, CHF, Carotid Artery stenosis, HLD, dementia, asthma, PD GERD    Presented with shortness of breath and wheezing started today she is unsure what has triggered this. Reports had a mild cold for the past few days. No longer smokes, no t on home oxygen at baseline but does hav a tank available at home.  Denies any fever chills no sick contacts she has a aide who comes in to help her move her son on a daily basis who does the grocery shopping for them.  It has been well and have not had any respiratory complaints.  Today given increased shortness of breath EMS was called they used 3 duo nebs EMS administered magnesium 2 g and Solu-Medrol 125 on arrival blood pressure 163/94 respirations 21 satting 99% Oxygen saturation 94% room air Started suddenly no associated chest pain no fevers no abdominal pain or leg swelling she improved somewhat after DuoNeb Patient has had frequent episodes similar to this in the past Last admission was in December 2019 for COPD exacerbation. Patient no longer smokes but used to smoke years ago. Infectious risk factors:  Reports  shortness of breath,   In RAPID COVID TEST NEGATIVE     Regarding pertinent Chronic problems:     Hyperlipidemia - on statins Pravachol   HTN on Coreg cozaar    CHF diastolic  - last echo 11/26/2017 LV EF: 55% -   60% grade 1 diastolic dysfunction).     Asthma -well  ontrolled on home inhalers        While in ER: Noted to have elevated troponin Hypoxic down to 88% on room air was started on 2 L and  currently satting 97%  The following Work up has been ordered so far:  Orders Placed This Encounter  Procedures   SARS Coronavirus 2 (CEPHEID- Performed in [facility] hospital lab), Hosp Order   DG Chest [place] 1 View   Basic metabolic panel   CBC with Differential   Troponin I - Once   Brain natriuretic peptide   Blood gas, venous   Troponin I - Now Then Q6H   D-dimer, quantitative (not at [facility])   Cardiac monitoring   Repeat Vital Signs   Cardiac monitoring   Consult to hospitalist  ALL PATIENTS BEING ADMITTED/HAVING PROCEDURES NEED COVID-19 SCREENING   Droplet and Contact precautions   ED EKG   EKG 12-Lead   EKG 12-Lead   Saline lock IV   Admit to Inpatient (patient's expected length of stay will be greater than 2 midnights or inpatient only procedure)     Following Medications were ordered in ER: Medications  albuterol (VENTOLIN HFA) 108 (90 Base) MCG/ACT inhaler 4 puff (4 puffs Inhalation Given 10/04/18 2136)  aspirin chewable tablet 324 mg (324 mg Oral Given 10/04/18 2318)        Consult Orders  (From admission, onward)         Start     Ordered   10/04/18 2335  Consult to  hospitalist  ALL PATIENTS BEING ADMITTED/HAVING PROCEDURES NEED COVID-19 SCREENING  Once    Comments:  ALL PATIENTS BEING ADMITTED/HAVING PROCEDURES NEED COVID-19 SCREENING  Provider:  (Not yet assigned)  Question Answer Comment  Place call to: [facility]   Reason for Consult Admit      10/04/18 2334           Significant initial  Findings: Abnormal Labs Reviewed  BASIC METABOLIC PANEL - Abnormal; Notable for the following components:      Result Value   Glucose, Bld 147 (*)    Creatinine, Ser 1.07 (*)    GFR calc non Af Amer 49 (*)    GFR calc Af Amer 56 (*)    All other components within normal limits  TROPONIN I - Abnormal; Notable for the following components:   Troponin I 0.07 (*)    All other components within normal limits  BRAIN NATRIURETIC PEPTIDE  - Abnormal; Notable for the following components:   B Natriuretic Peptide 100.7 (*)    All other components within normal limits  BLOOD GAS, VENOUS - Abnormal; Notable for the following components:   pO2, Ven 51.7 (*)    All other components within normal limits     Otherwise labs showing:    Recent Labs  Lab 09/30/18 0843 10/04/18 2135  NA 144 144  K 4.1 3.6  CO2 21 25  GLUCOSE 98 147*  BUN 15 22  CREATININE 1.16* 1.07*  CALCIUM 9.4 9.2    Cr    stable,   Lab Results  Component Value Date   CREATININE 1.07 (H) 10/04/2018   CREATININE 1.16 (H) 09/30/2018   CREATININE 1.05 (H) 08/04/2018    Recent Labs  Lab 09/30/18 0843  AST 27  ALT 19  ALKPHOS 105  BILITOT 0.2  PROT 6.6  ALBUMIN 4.3   Lab Results  Component Value Date   CALCIUM 9.2 10/04/2018      WBC      Component Value Date/Time   WBC 8.1 10/04/2018 2135   ANC    Component Value Date/Time   NEUTROABS 6.3 10/04/2018 2135   NEUTROABS 4.9 09/30/2018 0843     Plt: Lab Results  Component Value Date   PLT 258 10/04/2018     Lactic Acid, Venous    Component Value Date/Time   LATICACIDVEN 1.5 04/05/2016 1654       COVID-19 Labs    Lab Results  Component Value Date   SARSCOV2NAA NEGATIVE 10/04/2018       Venous  Blood Gas result:  pH 7.350 PCO2 49; .    HG/HCT  Stable     Component Value Date/Time   HGB 13.9 10/04/2018 2135   HGB 14.3 09/30/2018 0843   HCT 42.6 10/04/2018 2135   HCT 44.6 09/30/2018 0843    Troponin baseline troponin around 0.04 Cardiac Panel (last 3 results) Recent Labs    10/04/18 2135  TROPONINI 0.07*        BNP (last 3 results) Recent Labs    04/21/18 0425 08/04/18 1425 10/04/18 2134  BNP 187.6* 191.2* 100.7*      UA  not ordered     CXR - NON acute    ECG:  Personally reviewed by me showing: HR : 94 Rhythm:  LBBB,    no evidence of ischemic changes QTC 503      ED Triage Vitals [10/04/18 2032]  Enc Vitals Group     BP (!)  146/93  Pulse Rate 100     Resp 14     Temp 98.1 F (36.7 C)     Temp Source Oral     SpO2 (!) 88 %     Weight      Height      Head Circumference      Peak Flow      Pain Score      Pain Loc      Pain Edu?      Excl. in GC?   ZOXW(96)@TMAX(24)@       Latest  Blood pressure (!) 180/69, pulse 60, temperature 98.1 F (36.7 C), temperature source Oral, resp. rate (!) 24, SpO2 96 %.     Hospitalist was called for admission for COPD exacerbation   Review of Systems:    Pertinent positives include: shortness of breath at rest.  dyspnea on exertion,  non-productive cough, wheezing.  Constitutional:  No weight loss, night sweats, Fevers, chills, fatigue, weight loss  HEENT:  No headaches, Difficulty swallowing,Tooth/dental problems,Sore throat,  No sneezing, itching, ear ache, nasal congestion, post nasal drip,  Cardio-vascular:  No chest pain, Orthopnea, PND, anasarca, dizziness, palpitations.no Bilateral lower extremity swelling  GI:  No heartburn, indigestion, abdominal pain, nausea, vomiting, diarrhea, change in bowel habits, loss of appetite, melena, blood in stool, hematemesis Resp:    No excess mucus, no productive cough,  No coughing up of blood.No change in color of mucus.  Skin:  no rash or lesions. No jaundice GU:  no dysuria, change in color of urine, no urgency or frequency. No straining to urinate.  No flank pain.  Musculoskeletal:  No joint pain or no joint swelling. No decreased range of motion. No back pain.  Psych:  No change in mood or affect. No depression or anxiety. No memory loss.  Neuro: no localizing neurological complaints, no tingling, no weakness, no double vision, no gait abnormality, no slurred speech, no confusion  All systems reviewed and apart from HOPI all are negative  Past Medical History:   Past Medical History:  Diagnosis Date   Allergic rhinitis    Anxiety    Arthritis    Asthma    CAP (community acquired pneumonia)  11/26/2017   COPD (chronic obstructive pulmonary disease) (HCC)    Depression    Dyspnea    with exertion    Elevated troponin 04/05/2016   Essential hypertension, benign    GERD (gastroesophageal reflux disease)    Headache    occiptal neuralgia - left    Heart murmur    History of blood transfusion    "related to miscarriage"   Left bundle branch block    in & out of A fib   Memory loss    Mixed hyperlipidemia    Occipital neuralgia of left side    Osteopenia    Secondary cardiomyopathy (HCC)    LVEF 40-45%, likely nonischemic   Urinary urgency      Past Surgical History:  Procedure Laterality Date   APPENDECTOMY     BACK SURGERY     CARDIAC CATHETERIZATION  2010   Normal coronaries   CATARACT EXTRACTION W/PHACO  04/16/2011   Procedure: CATARACT EXTRACTION PHACO AND INTRAOCULAR LENS PLACEMENT (IOC);  Surgeon: Susa Simmondsarroll F Haines;  Location: [facility];  Service: Ophthalmology;  Laterality: Left;  CDE=11.35   CATARACT EXTRACTION W/PHACO  01/28/2012   Procedure: CATARACT EXTRACTION PHACO AND INTRAOCULAR LENS PLACEMENT (IOC);  Surgeon: Susa Simmondsarroll F Haines, MD;  Location: [facility];  Service: Ophthalmology;  Laterality: Right;  CDI:8.15   CYSTO WITH HYDRODISTENSION  05/29/2012   Procedure: CYSTOSCOPY/HYDRODISTENSION;  Surgeon: Reece Packer, MD;  Location[facility];  Service: Urology;  Laterality: N/A;  INSTILLATION OF MARCAINE AND PYRIDIUM    DILATION AND CURETTAGE OF UTERUS  <hysterectomy"   EYE SURGERY     HAMMER TOE SURGERY Bilateral 11/22/11   [place], [place]   JOINT REPLACEMENT     LAPAROSCOPIC CHOLECYSTECTOMY  1991   LUMBAR LAMINECTOMY  2011   LUMBAR LAMINECTOMY/DECOMPRESSION MICRODISCECTOMY Left 06/06/2012   Procedure: LUMBAR LAMINECTOMY/DECOMPRESSION MICRODISCECTOMY 1 LEVEL;  Surgeon: Erline Levine, MD;  Location: [facility];  Service: Neurosurgery;  Laterality: Left;  Left Lumbar two-three Laminectomy for resection of synovial cyst    SHOULDER ARTHROSCOPY Right 02/12/2018   SHOULDER ARTHROSCOPY, BICEPS DEBRIDEMENT, REMOVAL OF LOOSE BODIES   SHOULDER ARTHROSCOPY Right 02/12/2018   Procedure: RIGHT SHOULDER ARTHROSCOPY, BICEPS DEBRIDEMENT, REMOVAL OF LOOSE BODIES;  Surgeon: Marybelle Killings, MD;  Location: [place];  Service: Orthopedics;  Laterality: Right;   SHOULDER OPEN ROTATOR CUFF REPAIR Right 2011   TONSILLECTOMY     TOTAL HIP ARTHROPLASTY Right 03/13/2013   Procedure: TOTAL HIP ARTHROPLASTY;  Surgeon: Kerin Salen, MD;  Location: [place];  Service: Orthopedics;  Laterality: Right;   TOTAL SHOULDER ARTHROPLASTY Right 07/02/2018   Procedure: RIGHT TOTAL SHOULDER ARTHROPLASTY;  Surgeon: Marybelle Killings, MD;  Location: [place];  Service: Orthopedics;  Laterality: Right;   VAGINAL HYSTERECTOMY      Social History:  Ambulatory independently       reports that she quit smoking about 3 weeks ago. Her smoking use included cigarettes. She quit after 60.00 years of use. She has never used smokeless tobacco. She reports previous alcohol use. She reports that she does not use drugs.     Family History:   Family History  Problem Relation Age of Onset   Diabetes Father    Diabetes Brother    Cancer Brother 28       colon   Diabetes Brother    Diabetes Brother    Hypertension Sister    Diabetes Sister    Diabetes Sister    Diabetes Other    Allergies Other    Diabetes Son    Colon cancer Neg Hx    Anesthesia problems Neg Hx    Hypotension Neg Hx    Malignant hyperthermia Neg Hx    Pseudochol deficiency Neg Hx     Allergies: Allergies  Allergen Reactions   Lisinopril-Hydrochlorothiazide Swelling, Other (See Comments) and Cough    Lip swelling and cough. Stopped by ENT. Patient is on Losartan without any issues    Codeine Itching and Swelling    eye irritation (prescribed percocet)   Penicillins Itching and Swelling    PATIENT HAS HAD A PCN REACTION WITH IMMEDIATE RASH, FACIAL/TONGUE/THROAT  SWELLING, SOB, OR LIGHTHEADEDNESS WITH HYPOTENSION:  #  #  YES  #  #  Has patient had a PCN reaction causing severe rash involving mucus membranes or skin necrosis: No Has patient had a PCN reaction that required hospitalization No Has patient had a PCN reaction occurring within the last 10 years: No eye irritation   Aricept [Donepezil Hcl] Nausea And Vomiting   Namenda [Memantine Hcl] Nausea And Vomiting     Prior to Admission medications   Medication Sig Start Date End Date Taking? Authorizing Provider  albuterol (PROVENTIL HFA;VENTOLIN HFA) 108 (90 Base) MCG/ACT inhaler Inhale 1-2 puffs into the lungs every 6 (  six) hours as needed for wheezing or shortness of breath.    [provider]  albuterol (PROVENTIL) (2.5 MG/3ML) 0.083% nebulizer solution  09/16/18   [provider]  aspirin EC 81 MG tablet Take 81 mg by mouth every evening.     [provider]  azelastine (ASTELIN) 0.1 % nasal spray Place 2 sprays into both nostrils 2 (two) times daily. Use in each nostril as directed 11/28/17   Rhetta Mura, MD  Biotin 5 MG TABS Take 5 mg by mouth every morning.     [provider]  budesonide-formoterol (SYMBICORT) 160-4.5 MCG/ACT inhaler TAKE 2 PUFFS BY MOUTH TWICE A DAY 10/01/18   Sonny Masters, FNP  buPROPion (WELLBUTRIN SR) 150 MG 12 hr tablet TAKE (1) TABLET TWICE A DAY. Patient taking differently: Take 150 mg by mouth 2 (two) times daily.  03/20/18   [name], MD  carvedilol (COREG) 6.25 MG tablet TAKE  (1)  TABLET TWICE A DAY WITH MEALS (BREAKFAST AND SUPPER) Patient taking differently: Take 6.25 mg by mouth 2 (two) times daily with a meal.  03/20/18   [name], MD  esomeprazole (NEXIUM) 40 MG capsule Take 1 capsule (40 mg total) by mouth daily before breakfast. 03/20/18   [name], MD  furosemide (LASIX) 20 MG tablet TAKE 2 TABLETS DAILY FOR 2 DAYS, THEN DECREASE BACK TO 1 DAILY 07/29/18   Rollene Rotunda, MD  gabapentin  (NEURONTIN) 100 MG capsule Take 1 capsule (100 mg total) by mouth 3 (three) times daily. 09/30/18   Sonny Masters, FNP  HYDROcodone-acetaminophen (NORCO) 7.5-325 MG tablet Take 1 tablet by mouth every 6 (six) hours as needed for moderate pain. post op pain 07/03/18   Eldred Manges, MD  loratadine (CLARITIN) 10 MG tablet Take 1 tablet (10 mg total) by mouth daily. 09/16/18   Sonny Masters, FNP  losartan (COZAAR) 50 MG tablet TAKE (1/2) TABLET DAILY. 09/15/18   Sonny Masters, FNP  memantine (NAMENDA) 10 MG tablet Take 1 tablet (10 mg total) by mouth 2 (two) times daily for 30 days. 09/30/18 10/30/18  Sonny Masters, FNP  Multiple Vitamins-Minerals (CENTRUM SILVER PO) Take 1 tablet by mouth every morning.     [provider]  ondansetron (ZOFRAN) 4 MG tablet Take 1 tablet (4 mg total) by mouth every 8 (eight) hours as needed for nausea or vomiting. 08/18/18   [name], [name], FNP  potassium chloride SA (K-DUR,KLOR-CON) 20 MEQ tablet Take 1 tablet (20 mEq total) by mouth daily. Patient taking differently: Take 20 mEq by mouth every morning.  07/04/17   Rollene Rotunda, MD  pravastatin (PRAVACHOL) 80 MG tablet Take 1 tablet (80 mg total) by mouth daily. 09/18/18 12/17/18  Sonny Masters, FNP  raloxifene (EVISTA) 60 MG tablet TAKE 1 TABLET DAILY Patient taking differently: Take 60 mg by mouth daily.  06/19/18   Sonny Masters, FNP  Spacer/Aero-Holding Chambers (AEROCHAMBER MV) inhaler Use as instructed 12/24/17   Coral Ceo, NP   Physical Exam: Blood pressure (!) 180/69, pulse 60, temperature 98.1 F (36.7 C), temperature source Oral, resp. rate (!) 24, SpO2 96 %. 1. General:  in No Acute distress    Chronically ill  -appearing 2. Psychological: Alert and Oriented 3. Head/ENT:    Dry Mucous Membranes                          Head Non traumatic, neck  supple                             Poor Dentition 4. SKIN:   decreased Skin turgor,  Skin clean Dry and intact no rash 5. Heart: Regular rate and rhythm  no  Murmur, no Rub or gallop 6. Lungs: mild occasional  wheezes or crackles   7. Abdomen: Soft,  non-tender, Non distended  obese  bowel sounds present 8. Lower extremities: no clubbing, cyanosis, no  edema 9. Neurologically Grossly intact, moving all 4 extremities equally  10. MSK: Normal range of motion   All other LABS:     Recent Labs  Lab 09/30/18 0843 10/04/18 2135  WBC 7.8 8.1  NEUTROABS 4.9 6.3  HGB 14.3 13.9  HCT 44.6 42.6  MCV 93 92.4  PLT 277 258     Recent Labs  Lab 09/30/18 0843 10/04/18 2135  NA 144 144  K 4.1 3.6  CL 108* 110  CO2 21 25  GLUCOSE 98 147*  BUN 15 22  CREATININE 1.16* 1.07*  CALCIUM 9.4 9.2     Recent Labs  Lab 09/30/18 0843  AST 27  ALT 19  ALKPHOS 105  BILITOT 0.2  PROT 6.6  ALBUMIN 4.3       Cultures:    Component Value Date/Time   SDES BLOOD RIGHT HAND 11/26/2017 0528   SPECREQUEST  11/26/2017 0528    BOTTLES DRAWN AEROBIC ONLY Blood Culture results may not be optimal due to an excessive volume of blood received in culture bottles   CULT  11/26/2017 0528    NO GROWTH 5 DAYS Performed at [facility], [address], [place], [place] [address]    REPTSTATUS 12/01/2017 FINAL 11/26/2017 0528     Radiological Exams on Admission: Dg Chest Port 1 View  Result Date: 10/04/2018 CLINICAL DATA:  84 year old female with history of shortness of breath. EXAM: PORTABLE CHEST 1 VIEW COMPARISON:  Chest x-ray 05/05/2018. FINDINGS: Lung volumes are normal. No consolidative airspace disease. No pleural effusions. No evidence of pulmonary edema. Heart size is mildly enlarged. Upper mediastinal contours are within normal limits. Aortic atherosclerosis. Status post right shoulder arthroplasty. IMPRESSION: 1. No radiographic evidence of acute cardiopulmonary disease. 2. Mild cardiomegaly. 3. Aortic atherosclerosis. Electronically Signed   By: Trudie Reed M.D.   On: 10/04/2018 22:05    Chart has been  reviewed    Assessment/Plan  84 y.o. female with medical history significant of HTN, CHF, Carotid Artery stenosis, HLD, dementia, asthma, PD GERD    Admitted for COPD exacerbation  Present on Admission:  COPD exacerbation (HCC) -  -  - Will initiate: Steroid taper  -  Antibiotics  Doxycycline, -  XopenexPRN, - scheduled duoneb,  -  Breo or Dulera at discharge   -  Mucinex.  Titrate O2 to saturation >90%. Follow patients respiratory status.   VBG no evidence of hypercarbia    Currently mentating well no evidence of symptomatic hypercarbia    Chronic diastolic heart failure (HCC) - - currently appears to be slightly on the dry side, hold home diuretics for tonight and restart when appears euvolemic, carefuly follow fluid status and Cr   Dementia without behavioral disturbance (HCC) -dementia chronic continue home medications watch for any signs of sundowning  Essential hypertension chronic continue home medications  Hyperlipidemia stable chronic continue home medications  Prolonged QT interval- - will monitor on tele avoid QT prolonging medications, rehydrate correct  electrolytes  Elevated troponin -somewhat above baseline she has chronically elevated troponin patient currently denies any chest pain  And sudden onset of dyspnea will check d-dimer and order CTA if positive Probably demand ischemia in the setting of dehydration increased work of breathing and COPD exacerbation  - EKG without evidence of acute ischemia  - Admitted to telemetry   - continue to cycle cardiac enzymes      Other plan as per orders.  DVT prophylaxis:    Lovenox     Code Status:  FULL CODE as per patient  I had personally discussed CODE STATUS with patient    Family Communication:   Family not at  Bedside    Disposition Plan:       To home once workup is complete and patient is stable                     Would benefit from PT/OT eval prior to DC  Ordered                                        Consults called: none Admission status:  ED Disposition    ED Disposition Condition Comment   Admit  Hospital Area: [facility] [[id number]]  Level of Care: Telemetry [5]  Admit to tele based on following criteria: Monitor QTC interval  Covid Evaluation: N/A  Diagnosis: COPD exacerbation (HCC) [035465]  Admitting Physician: Therisa Doyne [[id number]]  Attending Physician: Therisa Doyne [[id number]]  Estimated length of stay: 3 - 4 days  Certification:: I certify this patient will need inpatient services for at least 2 midnights  PT Class (Do Not Modify): Inpatient [101]  PT Acc Code (Do Not Modify): Private [1]        inpatient     Expect 2 midnight stay secondary to severity of patient's current illness including   hemodynamic instability despite optimal treatment (  Hypoxia, )   and extensive comorbidities including:     CHF     COPD     dementia   That are currently affecting medical management.   I expect  patient to be hospitalized for 2 midnights requiring inpatient medical care.  Patient is at high risk for adverse outcome (such as loss of life or disability) if not treated.  Indication for inpatient stay as follows:     New or worsening hypoxia       Level of care    tele  For 12H    Precautions: NONE     PPE: Used by the provider:   P100  eye Goggles,  Gloves       Julanne Schlueter 10/05/2018, 12:49 AM    [facility]     after 2 AM please page floor coverage PA If 7AM-7PM, please contact the day team taking care of the patient using [email or web address]

## 2018-10-04 NOTE — ED Triage Notes (Signed)
Patient is from home and presents with shortness of breath for about 1.5 hours. EMS noticed wheezing in the lung fields. He has a history of COPD and Asthma. EMS administered 3 duonebs, 125 mg of solumedrol and 2 g of magnesium.    EMS vitals: 97.2 Temp 97 HR 94% O2 sats on room air 175/84 BP

## 2018-10-04 NOTE — ED Provider Notes (Signed)
[facility]-EMERGENCY DEPT Provider Note   CSN: [id number] Arrival date & time: 10/04/18  2024    History   Chief Complaint Chief Complaint  Patient presents with  . Shortness of Breath    HPI [name] is a 84 y.o. female.     The history is provided by the patient, the EMS personnel and medical records. No language interpreter was used.  Shortness of Breath    [name] is a 84 y.o. female who presents to the Emergency Department complaining of sob.  Sxs began at 3pm today and began abruptly with sob, cough (nonproductive).  Denies chest pain, fever, abdominal pain, leg swelling or pain.  She has mild improvement following treatments provided by EMS. She lives with her son. She reports similar episodes in the past but is unsure of what the diagnosis was at that time.  Past Medical History:  Diagnosis Date  . Allergic rhinitis   . Anxiety   . Arthritis   . Asthma   . CAP (community acquired pneumonia) 11/26/2017  . COPD (chronic obstructive pulmonary disease) (HCC)   . Depression   . Dyspnea    with exertion   . Elevated troponin 04/05/2016  . Essential hypertension, benign   . GERD (gastroesophageal reflux disease)   . Headache    occiptal neuralgia - left   . Heart murmur   . History of blood transfusion    "related to miscarriage"  . Left bundle branch block    in & out of A fib  . Memory loss   . Mixed hyperlipidemia   . Occipital neuralgia of left side   . Osteopenia   . 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 Urinary urgency     Patient Active Problem List   Diagnosis Date Noted  . Hot flashes due to menopause 09/30/2018  . H/O total shoulder replacement, right 07/17/2018  . Shoulder arthritis 07/02/2018  . Dementia without behavioral disturbance (HCC) 05/29/2018  . SOB (shortness of breath) 04/22/2018  . COPD exacerbation (HCC)   . Occipital neuralgia of left side 03/20/2018  .  Glenohumeral arthritis, right 02/12/2018  . Tobacco use 12/24/2017  . GOLD COPD 0 with Asthmatic Component    . Biceps tendinosis of right shoulder 08/04/2017  . Pre-operative cardiovascular examination 08/01/2017  . Carotid artery disease (HCC) 07/03/2017  . DOE (dyspnea on exertion) 07/03/2017  . Chronic right shoulder pain 12/27/2016  . Constipation 04/11/2016  . Chronic diastolic heart failure (HCC) 04/07/2016  . Hyperglycemia 04/05/2016  . Syncope 11/15/2015  . Dehydration 10/05/2015  . UTI (lower urinary tract infection) 10/05/2015  . Mild dementia (HCC) 09/13/2015  . Memory problem 05/07/2015  . Neck pain 04/06/2015  . Insomnia 12/10/2014  . BMI 28.0-28.9,adult 12/10/2014  . Osteopenia 05/19/2014  . GAD (generalized anxiety disorder) 01/06/2014  . Depression 01/06/2014  . GERD (gastroesophageal reflux disease) 01/06/2014  . Vitamin D deficiency 01/06/2014  . Overactive bladder 01/06/2014  . Secondary cardiomyopathy (HCC) 10/06/2013  . Left bundle branch block 10/06/2013  . Dizziness 10/04/2013  . Osteoarthritis of right hip 03/14/2013  . Cough 02/01/2011  . Reflux 02/01/2011  . Allergic rhinitis 12/07/2008  . Chronic cough 12/07/2008  . Hepatitis B virus infection 08/16/2008  . Hyperlipidemia 08/16/2008  . Essential hypertension 08/16/2008  . DISC DEGENERATION 07/24/2007    Past Surgical History:  Procedure Laterality Date  . APPENDECTOMY    . BACK SURGERY    .  CARDIAC CATHETERIZATION  2010   Normal coronaries  . CATARACT EXTRACTION W/PHACO  04/16/2011   Procedure: CATARACT EXTRACTION PHACO AND INTRAOCULAR LENS PLACEMENT (IOC);  Surgeon: Susa Simmondsarroll F Haines;  Location: [facility];  Service: Ophthalmology;  Laterality: Left;  CDE=11.35  . CATARACT EXTRACTION W/PHACO  01/28/2012   Procedure: CATARACT EXTRACTION PHACO AND INTRAOCULAR LENS PLACEMENT (IOC);  Surgeon: Susa Simmondsarroll F Haines, MD;  Location: [facility];  Service: Ophthalmology;  Laterality: Right;  CDI:8.15  . CYSTO  WITH HYDRODISTENSION  05/29/2012   Procedure: CYSTOSCOPY/HYDRODISTENSION;  Surgeon: Martina SinnerScott A MacDiarmid, MD;  [facility];  Service: Urology;  Laterality: N/A;  INSTILLATION OF MARCAINE AND PYRIDIUM   . DILATION AND CURETTAGE OF UTERUS  <hysterectomy"  . EYE SURGERY    . HAMMER TOE SURGERY Bilateral 11/22/11   [facility], [facility]  . JOINT REPLACEMENT    . LAPAROSCOPIC CHOLECYSTECTOMY  1991  . LUMBAR LAMINECTOMY  2011  . LUMBAR LAMINECTOMY/DECOMPRESSION MICRODISCECTOMY Left 06/06/2012   Procedure: LUMBAR LAMINECTOMY/DECOMPRESSION MICRODISCECTOMY 1 LEVEL;  Surgeon: Maeola HarmanJoseph Stern, MD;  Location: [facility];  Service: Neurosurgery;  Laterality: Left;  Left Lumbar two-three Laminectomy for resection of synovial cyst  . SHOULDER ARTHROSCOPY Right 02/12/2018   SHOULDER ARTHROSCOPY, BICEPS DEBRIDEMENT, REMOVAL OF LOOSE BODIES  . SHOULDER ARTHROSCOPY Right 02/12/2018   Procedure: RIGHT SHOULDER ARTHROSCOPY, BICEPS DEBRIDEMENT, REMOVAL OF LOOSE BODIES;  Surgeon: Eldred MangesYates, Mark C, MD;  Location: [facility];  Service: Orthopedics;  Laterality: Right;  . SHOULDER OPEN ROTATOR CUFF REPAIR Right 2011  . TONSILLECTOMY    . TOTAL HIP ARTHROPLASTY Right 03/13/2013   Procedure: TOTAL HIP ARTHROPLASTY;  Surgeon: Nestor LewandowskyFrank J Rowan, MD;  Location: [facility];  Service: Orthopedics;  Laterality: Right;  . TOTAL SHOULDER ARTHROPLASTY Right 07/02/2018   Procedure: RIGHT TOTAL SHOULDER ARTHROPLASTY;  Surgeon: Eldred MangesYates, Mark C, MD;  Location: [facility];  Service: Orthopedics;  Laterality: Right;  Marland Kitchen. VAGINAL HYSTERECTOMY       OB History    Gravida  5   Para  5   Term  5   Preterm      AB      Living  4     SAB      TAB      Ectopic      Multiple      Live Births               Home Medications    Prior to Admission medications   Medication Sig Start Date End Date Taking? Authorizing Provider  albuterol (PROVENTIL HFA;VENTOLIN HFA) 108 (90 Base) MCG/ACT inhaler Inhale 1-2 puffs into the lungs every 6  (six) hours as needed for wheezing or shortness of breath.    [provider]  albuterol (PROVENTIL) (2.5 MG/3ML) 0.083% nebulizer solution  09/16/18   [provider]  aspirin EC 81 MG tablet Take 81 mg by mouth every evening.     [provider]  azelastine (ASTELIN) 0.1 % nasal spray Place 2 sprays into both nostrils 2 (two) times daily. Use in each nostril as directed 11/28/17   Rhetta MuraSamtani, Jai-Gurmukh, MD  Biotin 5 MG TABS Take 5 mg by mouth every morning.     [provider]  budesonide-formoterol (SYMBICORT) 160-4.5 MCG/ACT inhaler TAKE 2 PUFFS BY MOUTH TWICE A DAY 10/01/18   Sonny Mastersakes, Linda M, FNP  buPROPion (WELLBUTRIN SR) 150 MG 12 hr tablet TAKE (1) TABLET TWICE A DAY. Patient taking differently: Take 150 mg by mouth 2 (two) times daily.  03/20/18   Johna SheriffVincent, Carol L, MD  carvedilol (COREG) 6.25 MG tablet TAKE  (1)  TABLET TWICE A DAY WITH MEALS (BREAKFAST AND SUPPER) Patient taking differently: Take 6.25 mg by mouth 2 (two) times daily with a meal.  03/20/18   Johna SheriffVincent, Carol L, MD  esomeprazole (NEXIUM) 40 MG capsule Take 1 capsule (40 mg total) by mouth daily before breakfast. 03/20/18   Johna SheriffVincent, Carol L, MD  furosemide (LASIX) 20 MG tablet TAKE 2 TABLETS DAILY FOR 2 DAYS, THEN DECREASE BACK TO 1 DAILY 07/29/18   Rollene RotundaHochrein, [name], MD  gabapentin (NEURONTIN) 100 MG capsule Take 1 capsule (100 mg total) by mouth 3 (three) times daily. 09/30/18   Sonny Mastersakes, Linda M, FNP  HYDROcodone-acetaminophen (NORCO) 7.5-325 MG tablet Take 1 tablet by mouth every 6 (six) hours as needed for moderate pain. post op pain 07/03/18   Eldred MangesYates, Mark C, MD  loratadine (CLARITIN) 10 MG tablet Take 1 tablet (10 mg total) by mouth daily. 09/16/18   Sonny Mastersakes, Linda M, FNP  losartan (COZAAR) 50 MG tablet TAKE (1/2) TABLET DAILY. 09/15/18   Sonny Mastersakes, Linda M, FNP  memantine (NAMENDA) 10 MG tablet Take 1 tablet (10 mg total) by mouth 2 (two) times daily for 30 days. 09/30/18 10/30/18  Sonny Mastersakes, Linda M, FNP  Multiple  Vitamins-Minerals (CENTRUM SILVER PO) Take 1 tablet by mouth every morning.     [provider]  ondansetron (ZOFRAN) 4 MG tablet Take 1 tablet (4 mg total) by mouth every 8 (eight) hours as needed for nausea or vomiting. 08/18/18   [name], Doralee AlbinoLinda M, FNP  potassium chloride SA (K-DUR,KLOR-CON) 20 MEQ tablet Take 1 tablet (20 mEq total) by mouth daily. Patient taking differently: Take 20 mEq by mouth every morning.  07/04/17   Rollene RotundaHochrein, [name], MD  pravastatin (PRAVACHOL) 80 MG tablet Take 1 tablet (80 mg total) by mouth daily. 09/18/18 12/17/18  Sonny Mastersakes, Linda M, FNP  raloxifene (EVISTA) 60 MG tablet TAKE 1 TABLET DAILY Patient taking differently: Take 60 mg by mouth daily.  06/19/18   Sonny Mastersakes, Linda M, FNP  Spacer/Aero-Holding Chambers (AEROCHAMBER MV) inhaler Use as instructed 12/24/17   Coral CeoMack, Brian P, NP    Family History Family History  Problem Relation Age of Onset  . Diabetes Father   . Diabetes Brother   . Cancer Brother [id number]       colon  . Diabetes Brother   . Diabetes Brother   . Hypertension Sister   . Diabetes Sister   . Diabetes Sister   . Diabetes Other   . Allergies Other   . Diabetes Son   . Colon cancer Neg Hx   . Anesthesia problems Neg Hx   . Hypotension Neg Hx   . Malignant hyperthermia Neg Hx   . Pseudochol deficiency Neg Hx     Social History Social History   Tobacco Use  . Smoking status: Former Smoker    Years: 60.00    Types: Cigarettes    Last attempt to quit: 09/12/2018    Years since quitting: 0.0  . Smokeless tobacco: Never Used  Substance Use Topics  . Alcohol use: Not Currently    Comment: 11/27/2017 "drank some in my 20s"  . Drug use: No     Allergies   Lisinopril-hydrochlorothiazide; Codeine; Penicillins; Aricept [donepezil hcl]; and Namenda [memantine hcl]   Review of Systems Review of Systems  Respiratory: Positive for shortness of breath.   All other systems reviewed and are negative.    Physical Exam Updated  Vital Signs BP (!)  186/59   Pulse (!) 58   Temp 98.1 F (36.7 C) (Oral)   Resp (!) 21   SpO2 95%   Physical Exam Vitals signs and nursing note reviewed.  Constitutional:      Appearance: She is well-developed. She is ill-appearing.  HENT:     Head: Normocephalic and atraumatic.  Cardiovascular:     Rate and Rhythm: Normal rate and regular rhythm.     Heart sounds: No murmur.  Pulmonary:     Effort: Respiratory distress present.     Comments: Fair air movement bilaterally with occasional and expiratory wheezes. Abdominal:     Palpations: Abdomen is soft.     Tenderness: There is no abdominal tenderness. There is no guarding or rebound.  Musculoskeletal:        General: No tenderness.  Skin:    General: Skin is warm and dry.  Neurological:     Mental Status: She is alert and oriented to person, place, and time.  Psychiatric:        Behavior: Behavior normal.      ED Treatments / Results  Labs (all labs ordered are listed, but only abnormal results are displayed) Labs Reviewed  BASIC METABOLIC PANEL - Abnormal; Notable for the following components:      Result Value   Glucose, Bld 147 (*)    Creatinine, Ser 1.07 (*)    GFR calc non Af Amer 49 (*)    GFR calc Af Amer 56 (*)    All other components within normal limits  TROPONIN I - Abnormal; Notable for the following components:   Troponin I 0.07 (*)    All other components within normal limits  BRAIN NATRIURETIC PEPTIDE - Abnormal; Notable for the following components:   B Natriuretic Peptide 100.7 (*)    All other components within normal limits  BLOOD GAS, VENOUS - Abnormal; Notable for the following components:   pO2, Ven 51.7 (*)    All other components within normal limits  SARS CORONAVIRUS 2 (HOSPITAL ORDER, PERFORMED IN [facility])  CBC WITH DIFFERENTIAL/PLATELET  TROPONIN I  TROPONIN I  TROPONIN I  D-DIMER, QUANTITATIVE (NOT AT [facility])    EKG EKG Interpretation  Date/Time:  Saturday October 04 2018 22:54:22  EDT Ventricular Rate:  94 PR Interval:    QRS Duration: 137 QT Interval:  415 QTC Calculation: 503 R Axis:   80 Text Interpretation:  Sinus rhythm Atrial premature complexes Left bundle branch block Confirmed by Tilden Fossa [phone]) on 10/04/2018 11:11:16 PM   Radiology Dg Chest Port 1 View  Result Date: 10/04/2018 CLINICAL DATA:  84 year old female with history of shortness of breath. EXAM: PORTABLE CHEST 1 VIEW COMPARISON:  Chest x-ray 05/05/2018. FINDINGS: Lung volumes are normal. No consolidative airspace disease. No pleural effusions. No evidence of pulmonary edema. Heart size is mildly enlarged. Upper mediastinal contours are within normal limits. Aortic atherosclerosis. Status post right shoulder arthroplasty. IMPRESSION: 1. No radiographic evidence of acute cardiopulmonary disease. 2. Mild cardiomegaly. 3. Aortic atherosclerosis. Electronically Signed   By: Trudie Reed M.D.   On: 10/04/2018 22:05    Procedures Procedures (including critical care time)  Medications Ordered in ED Medications  albuterol (VENTOLIN HFA) 108 (90 Base) MCG/ACT inhaler 4 puff (4 puffs Inhalation Given 10/04/18 2136)  aspirin chewable tablet 324 mg (324 mg Oral Given 10/04/18 2318)     Initial Impression / Assessment and Plan / ED Course  I have reviewed the triage  vital signs and the nursing notes.  Pertinent labs & imaging results that were available during my care of the patient were reviewed by me and considered in my medical decision making (see chart for details).       Patient with history of COPD here for evaluation of shortness of breath that began abruptly at 3 PM today. She is comfortable at rest but developed significant distress with minimal activity. She was treated with albuterol, Solu-Medrol and magnesium prior to ED arrival. She was treated with additional albuterol in the emergency department. She does have a 2 L oxygen requirement in the emergency department. EKG demonstrates a  left bundle branch block, present on prior EKG's. Troponin is mildly elevated, slightly increased when compared to priors. BNP is minimally elevated but improved when compared to priors. Plan to admit for COPD exacerbation. Hospitalist consulted for admission.  Final Clinical Impressions(s) / ED Diagnoses   Final diagnoses:  COPD exacerbation ([place])  Acute respiratory distress    ED Discharge Orders    None       Quintella Reichert, MD 10/05/18 [phone]

## 2018-10-04 NOTE — ED Notes (Addendum)
Daughter requested to be called with updates.  Elenora Fender [phone]  Also has two other daughters and a god daughter who may call for updates.  Pt consented previously to them being able to know her medical information, per the daughter.  If pt needs a ride home or other immediate assistance, her god daughter is local and she requests that we contact her.  Elicia Lamp [phone]

## 2018-10-05 ENCOUNTER — Encounter (HOSPITAL_COMMUNITY): Payer: Self-pay | Admitting: Internal Medicine

## 2018-10-05 ENCOUNTER — Inpatient Hospital Stay (HOSPITAL_COMMUNITY): Payer: Medicare Other

## 2018-10-05 DIAGNOSIS — R9431 Abnormal electrocardiogram [ECG] [EKG]: Secondary | ICD-10-CM | POA: Diagnosis present

## 2018-10-05 DIAGNOSIS — R778 Other specified abnormalities of plasma proteins: Secondary | ICD-10-CM | POA: Diagnosis present

## 2018-10-05 LAB — COMPREHENSIVE METABOLIC PANEL
ALT: 26 U/L (ref 0–44)
AST: 33 U/L (ref 15–41)
Albumin: 3.6 g/dL (ref 3.5–5.0)
Alkaline Phosphatase: 92 U/L (ref 38–126)
Anion gap: 10 (ref 5–15)
BUN: 21 mg/dL (ref 8–23)
CO2: 19 mmol/L — ABNORMAL LOW (ref 22–32)
Calcium: 8.8 mg/dL — ABNORMAL LOW (ref 8.9–10.3)
Chloride: 113 mmol/L — ABNORMAL HIGH (ref 98–111)
Creatinine, Ser: 0.99 mg/dL (ref 0.44–1.00)
GFR calc Af Amer: >60 mL/min (ref >60)
GFR calc non Af Amer: 53 mL/min — ABNORMAL LOW (ref >60)
Glucose, Bld: 150 mg/dL — ABNORMAL HIGH (ref 70–99)
Potassium: 4.5 mmol/L (ref 3.5–5.1)
Sodium: 142 mmol/L (ref 135–145)
Total Bilirubin: <0.1 mg/dL — ABNORMAL LOW (ref 0.3–1.2)
Total Protein: 6.7 g/dL (ref 6.5–8.1)

## 2018-10-05 LAB — CBC
HCT: 41.8 % (ref 36.0–46.0)
Hemoglobin: 13.5 g/dL (ref 12.0–15.0)
MCH: 30.2 pg (ref 26.0–34.0)
MCHC: 32.3 g/dL (ref 30.0–36.0)
MCV: 93.5 fL (ref 80.0–100.0)
Platelets: 259 10*3/uL (ref 150–400)
RBC: 4.47 MIL/uL (ref 3.87–5.11)
RDW: 14.8 % (ref 11.5–15.5)
WBC: 5.2 10*3/uL (ref 4.0–10.5)
nRBC: 0 % (ref 0.0–0.2)

## 2018-10-05 LAB — TSH: TSH: 0.325 u[IU]/mL — ABNORMAL LOW (ref 0.350–4.500)

## 2018-10-05 LAB — MAGNESIUM
Magnesium: 3 mg/dL — ABNORMAL HIGH (ref 1.7–2.4)
Magnesium: 2.5 mg/dL — ABNORMAL HIGH (ref 1.7–2.4)

## 2018-10-05 LAB — TROPONIN I
Troponin I: 0.07 ng/mL (ref <0.03)
Troponin I: 0.08 ng/mL (ref <0.03)

## 2018-10-05 LAB — D-DIMER, QUANTITATIVE: D-Dimer, Quant: 1.74 ug/mL-FEU — ABNORMAL HIGH (ref 0.00–0.50)

## 2018-10-05 LAB — PHOSPHORUS: Phosphorus: 1.5 mg/dL — ABNORMAL LOW (ref 2.5–4.6)

## 2018-10-05 MED ORDER — POLYETHYLENE GLYCOL 3350 17 G PO PACK
17.0000 g | PACK | Freq: Every day | ORAL | Status: DC | PRN
  Administered 2018-10-05: 17 g via ORAL
  Filled 2018-10-05: qty 1

## 2018-10-05 MED ORDER — ONDANSETRON HCL 4 MG/2ML IJ SOLN: 4.0000 mg | Freq: Four times a day (QID) | INTRAMUSCULAR | Status: DC | PRN

## 2018-10-05 MED ORDER — PANTOPRAZOLE SODIUM 40 MG PO TBEC: 40.0000 mg | DELAYED_RELEASE_TABLET | Freq: Every day | ORAL | Status: DC

## 2018-10-05 MED ORDER — ENOXAPARIN SODIUM 40 MG/0.4ML ~~LOC~~ SOLN
40.0000 mg | SUBCUTANEOUS | Status: DC
  Administered 2018-10-05 – 2018-10-06 (×2): 40 mg via SUBCUTANEOUS
  Filled 2018-10-05 (×2): qty 0.4

## 2018-10-05 MED ORDER — IOHEXOL 350 MG/ML SOLN
100.0000 mL | Freq: Once | INTRAVENOUS | Status: AC | PRN
  Administered 2018-10-05: 100 mL via INTRAVENOUS

## 2018-10-05 MED ORDER — SODIUM CHLORIDE 0.9 % IV SOLN: 250.0000 mL | INTRAVENOUS | Status: DC | PRN

## 2018-10-05 MED ORDER — ACETAMINOPHEN 650 MG RE SUPP: 650.0000 mg | Freq: Four times a day (QID) | RECTAL | Status: DC | PRN

## 2018-10-05 MED ORDER — BUPROPION HCL ER (SR) 150 MG PO TB12
150.0000 mg | ORAL_TABLET | Freq: Two times a day (BID) | ORAL | Status: DC
  Administered 2018-10-05 – 2018-10-06 (×3): 150 mg via ORAL
  Filled 2018-10-05 (×3): qty 1

## 2018-10-05 MED ORDER — DOXYCYCLINE HYCLATE 100 MG PO TABS
100.0000 mg | ORAL_TABLET | Freq: Two times a day (BID) | ORAL | Status: DC
  Administered 2018-10-05 – 2018-10-06 (×4): 100 mg via ORAL
  Filled 2018-10-05 (×4): qty 1

## 2018-10-05 MED ORDER — GABAPENTIN 100 MG PO CAPS
100.0000 mg | ORAL_CAPSULE | Freq: Three times a day (TID) | ORAL | Status: DC
  Administered 2018-10-05 – 2018-10-06 (×4): 100 mg via ORAL
  Filled 2018-10-05 (×4): qty 1

## 2018-10-05 MED ORDER — CARVEDILOL 6.25 MG PO TABS
6.2500 mg | ORAL_TABLET | Freq: Two times a day (BID) | ORAL | Status: DC
  Administered 2018-10-05 – 2018-10-06 (×3): 6.25 mg via ORAL
  Filled 2018-10-05 (×3): qty 1

## 2018-10-05 MED ORDER — ACETAMINOPHEN 325 MG PO TABS
650.0000 mg | ORAL_TABLET | Freq: Four times a day (QID) | ORAL | Status: DC | PRN
  Administered 2018-10-05 – 2018-10-06 (×2): 650 mg via ORAL
  Filled 2018-10-05 (×2): qty 2

## 2018-10-05 MED ORDER — PRAVASTATIN SODIUM 20 MG PO TABS
80.0000 mg | ORAL_TABLET | Freq: Every day | ORAL | Status: DC
  Administered 2018-10-05 – 2018-10-06 (×2): 80 mg via ORAL
  Filled 2018-10-05 (×2): qty 4

## 2018-10-05 MED ORDER — POTASSIUM PHOSPHATE MONOBASIC 500 MG PO TABS
500.0000 mg | ORAL_TABLET | Freq: Three times a day (TID) | ORAL | Status: DC
  Administered 2018-10-05 – 2018-10-06 (×6): 500 mg via ORAL
  Filled 2018-10-05 (×7): qty 1

## 2018-10-05 MED ORDER — SODIUM CHLORIDE 0.9% FLUSH
3.0000 mL | Freq: Two times a day (BID) | INTRAVENOUS | Status: DC
  Administered 2018-10-05: 3 mL via INTRAVENOUS

## 2018-10-05 MED ORDER — IPRATROPIUM-ALBUTEROL 0.5-2.5 (3) MG/3ML IN SOLN
RESPIRATORY_TRACT | Status: AC
  Filled 2018-10-05: qty 3

## 2018-10-05 MED ORDER — RALOXIFENE HCL 60 MG PO TABS
60.0000 mg | ORAL_TABLET | Freq: Every day | ORAL | Status: DC
  Administered 2018-10-05 – 2018-10-06 (×2): 60 mg via ORAL
  Filled 2018-10-05 (×2): qty 1

## 2018-10-05 MED ORDER — SODIUM CHLORIDE 0.9% FLUSH: 3.0000 mL | INTRAVENOUS | Status: DC | PRN

## 2018-10-05 MED ORDER — LORATADINE 10 MG PO TABS
10.0000 mg | ORAL_TABLET | Freq: Every day | ORAL | Status: DC
  Administered 2018-10-05 – 2018-10-06 (×2): 10 mg via ORAL
  Filled 2018-10-05 (×2): qty 1

## 2018-10-05 MED ORDER — GUAIFENESIN-DM 100-10 MG/5ML PO SYRP
5.0000 mL | ORAL_SOLUTION | ORAL | Status: DC | PRN
  Filled 2018-10-05: qty 10

## 2018-10-05 MED ORDER — LOSARTAN POTASSIUM 50 MG PO TABS
50.0000 mg | ORAL_TABLET | Freq: Every day | ORAL | Status: DC
  Administered 2018-10-05 – 2018-10-06 (×2): 50 mg via ORAL
  Filled 2018-10-05 (×2): qty 1

## 2018-10-05 MED ORDER — PREDNISONE 20 MG PO TABS
40.0000 mg | ORAL_TABLET | Freq: Every day | ORAL | Status: DC
  Administered 2018-10-05 – 2018-10-06 (×2): 40 mg via ORAL
  Filled 2018-10-05 (×2): qty 2

## 2018-10-05 MED ORDER — IPRATROPIUM-ALBUTEROL 0.5-2.5 (3) MG/3ML IN SOLN
3.0000 mL | Freq: Four times a day (QID) | RESPIRATORY_TRACT | Status: DC
  Administered 2018-10-05 – 2018-10-06 (×6): 3 mL via RESPIRATORY_TRACT
  Filled 2018-10-05 (×5): qty 3

## 2018-10-05 MED ORDER — MOMETASONE FURO-FORMOTEROL FUM 200-5 MCG/ACT IN AERO
2.0000 | INHALATION_SPRAY | Freq: Two times a day (BID) | RESPIRATORY_TRACT | Status: DC
  Administered 2018-10-05 – 2018-10-06 (×3): 2 via RESPIRATORY_TRACT
  Filled 2018-10-05: qty 8.8

## 2018-10-05 MED ORDER — MEMANTINE HCL 10 MG PO TABS
10.0000 mg | ORAL_TABLET | Freq: Two times a day (BID) | ORAL | Status: DC
  Administered 2018-10-05 – 2018-10-06 (×3): 10 mg via ORAL
  Filled 2018-10-05 (×3): qty 1

## 2018-10-05 MED ORDER — ONDANSETRON HCL 4 MG PO TABS: 4.0000 mg | ORAL_TABLET | Freq: Four times a day (QID) | ORAL | Status: DC | PRN

## 2018-10-05 MED ORDER — LEVALBUTEROL HCL 0.63 MG/3ML IN NEBU: 0.6300 mg | INHALATION_SOLUTION | Freq: Four times a day (QID) | RESPIRATORY_TRACT | Status: DC | PRN

## 2018-10-05 MED ORDER — ALUM & MAG HYDROXIDE-SIMETH 200-200-20 MG/5ML PO SUSP
30.0000 mL | ORAL | Status: DC | PRN
  Administered 2018-10-05: 30 mL via ORAL
  Filled 2018-10-05: qty 30

## 2018-10-05 MED ORDER — SODIUM CHLORIDE (PF) 0.9 % IJ SOLN
INTRAMUSCULAR | Status: AC
  Filled 2018-10-05: qty 50

## 2018-10-05 MED ORDER — SODIUM CHLORIDE 0.9% FLUSH
3.0000 mL | Freq: Two times a day (BID) | INTRAVENOUS | Status: DC
  Administered 2018-10-05 – 2018-10-06 (×3): 3 mL via INTRAVENOUS

## 2018-10-05 NOTE — Progress Notes (Signed)
PROGRESS NOTE    [name]  [id number] DOB: 06/28/1934 DOA: 10/04/2018 PCP: Sonny Mastersakes, Linda M, FNP   Brief Narrative: Patient is 84 year old female with history of hypertension, CHF, carotid artery stenosis, hyperlipidemia, dementia, asthma who presents to the emergency department with shortness of breath and wheezing.  She is a past smoker.  She has an oxygen tank at home but uses only when necessary.  She was saturating 92% on room air on arrival.  Also complained of chest pain denies any abdomen pain or leg swelling.  D-dimer found to elevated.  Patient was initially admitted for the management of possible COPD exacerbation.Her last Admission was in December 2019 for COPD exacerbation.She follows with pulmonology as an outpatient for the management of her asthma.  Covid-19 negative.  Assessment & Plan:   Active Problems:   Hyperlipidemia   Essential hypertension   Chronic diastolic heart failure (HCC)   Carotid artery disease (HCC)   COPD exacerbation (HCC)   Dementia without behavioral disturbance (HCC)   Prolonged QT interval   Elevated troponin   COPD versus asthma exacerbation: Has no clear documentation of COPD as per pulmonology note as an outpatient.  She follows with Dr. Isaiah SergeMannam. She presented with shortness of breath, wheezing.  She has history of asthma.  Started on steroids which we will continue.  Also started on antibiotics.  Continue bronchodilators.  Currently on 1 L of oxygen per minute.  She has oxygen tank at home and uses only if necessary. She is a past smoker.  Elevated d-dimer: We will do a CT chest angiogram to rule out pulmonary embolism.  She does not have any leg swelling.  Continue DVT prophylaxis.  Chronic diastolic heart failure: Currently euvolemic.  Appears dry on presentation.  Home diuretics on hold.  She is on Lasix at home  Hypertension: Currently blood stable.  Continue home meds  Hyperlipidemia: Continue home medicines  Prolonged QT  interval: QT of 531.Avoid QT prolonging medications.  Elevated troponin: Most likely secondary to supply demand ischemia.  Troponins flat.  Hypophosphatemia: Being supplemented.  Will check phosphorus tomorrow        DVT prophylaxis: Lovenox Code Status: Full Family Communication: Discussed with patient. Disposition Plan: Likely home tomorrow   Consultants: None  Procedures: None  Antimicrobials:  Anti-infectives (From admission, onward)   Start     Dose/Rate Route Frequency Ordered Stop   10/05/18 0115  doxycycline (VIBRA-TABS) tablet 100 mg     100 mg Oral Every 12 hours 10/05/18 0104        Subjective: Patient seen and examined the bedside this morning.  Hemodynamically stable.  Comfortable.  Sitting in the chair.  Denies any shortness of breath.  Lungs were clear on examination.  Currently on 1 L of oxygen.  Objective: Vitals:   10/05/18 0125 10/05/18 0204 10/05/18 0541 10/05/18 0756  BP:  (!) 153/76 (!) 150/99   Pulse:   64   Resp:   20   Temp:   97.8 F (36.6 C)   TempSrc:   Oral   SpO2: 98% 97% 96% 97%  Weight:      Height:       No intake or output data in the 24 hours ending 10/05/18 1202 Filed Weights   10/05/18 0110  Weight: 71.9 kg    Examination:  General exam: Appears calm and comfortable ,Not in distress,average built elderly female HEENT:PERRL,Oral mucosa moist, Ear/Nose normal on gross exam Respiratory system: Bilateral equal air entry, normal vesicular breath  sounds, no wheezes or crackles  Cardiovascular system: S1 & S2 heard, RRR. No JVD, murmurs, rubs, gallops or clicks. No pedal edema. Gastrointestinal system: Abdomen is nondistended, soft and nontender. No organomegaly or masses felt. Normal bowel sounds heard. Central nervous system: Alert and oriented. No focal neurological deficits. Extremities: No edema, no clubbing ,no cyanosis, distal peripheral pulses palpable. Skin: No rashes, lesions or ulcers,no icterus ,no pallor MSK:  Normal muscle bulk,tone ,power Psychiatry: Judgement and insight appear normal. Mood & affect appropriate.     Data Reviewed: I have personally reviewed following labs and imaging studies  CBC: Recent Labs  Lab 09/30/18 0843 10/04/18 2135 10/05/18 0420  WBC 7.8 8.1 5.2  NEUTROABS 4.9 6.3  --   HGB 14.3 13.9 13.5  HCT 44.6 42.6 41.8  MCV 93 92.4 93.5  PLT 277 258 259   Basic Metabolic Panel: Recent Labs  Lab 09/30/18 0843 10/04/18 2135 10/05/18 0014 10/05/18 0420  NA 144 144  --  142  K 4.1 3.6  --  4.5  CL 108* 110  --  113*  CO2 21 25  --  19*  GLUCOSE 98 147*  --  150*  BUN 15 22  --  21  CREATININE 1.16* 1.07*  --  0.99  CALCIUM 9.4 9.2  --  8.8*  MG  --   --  3.0* 2.5*  PHOS  --   --   --  1.5*   GFR: Estimated Creatinine Clearance: 43.3 mL/min (by C-G formula based on SCr of 0.99 mg/dL). Liver Function Tests: Recent Labs  Lab 09/30/18 0843 10/05/18 0420  AST 27 33  ALT 19 26  ALKPHOS 105 92  BILITOT 0.2 <0.1*  PROT 6.6 6.7  ALBUMIN 4.3 3.6   No results for input(s): LIPASE, AMYLASE in the last 168 hours. No results for input(s): AMMONIA in the last 168 hours. Coagulation Profile: No results for input(s): INR, PROTIME in the last 168 hours. Cardiac Enzymes: Recent Labs  Lab 10/04/18 2135 10/05/18 0014 10/05/18 0420  TROPONINI 0.07* 0.07* 0.08*   BNP (last 3 results) No results for input(s): PROBNP in the last 8760 hours. HbA1C: No results for input(s): HGBA1C in the last 72 hours. CBG: No results for input(s): GLUCAP in the last 168 hours. Lipid Profile: No results for input(s): CHOL, HDL, LDLCALC, TRIG, CHOLHDL, LDLDIRECT in the last 72 hours. Thyroid Function Tests: Recent Labs    10/05/18 0420  TSH 0.325*   Anemia Panel: No results for input(s): VITAMINB12, FOLATE, FERRITIN, TIBC, IRON, RETICCTPCT in the last 72 hours. Sepsis Labs: No results for input(s): PROCALCITON, LATICACIDVEN in the last 168 hours.  Recent Results (from  the past 240 hour(s))  SARS Coronavirus 2 (CEPHEID- Performed in [facility] hospital lab), Hosp Order     Status: None   Collection Time: 10/04/18  9:35 PM  Result Value Ref Range Status   SARS Coronavirus 2 NEGATIVE NEGATIVE Final    Comment: (NOTE) If result is NEGATIVE SARS-CoV-2 target nucleic acids are NOT DETECTED. The SARS-CoV-2 RNA is generally detectable in upper and lower  respiratory specimens during the acute phase of infection. The lowest  concentration of SARS-CoV-2 viral copies this assay can detect is 250  copies / mL. A negative result does not preclude SARS-CoV-2 infection  and should not be used as the sole basis for treatment or other  patient management decisions.  A negative result may occur with  improper specimen collection / handling, submission of specimen other  than nasopharyngeal swab, presence of viral mutation(s) within the  areas targeted by this assay, and inadequate number of viral copies  (<250 copies / mL). A negative result must be combined with clinical  observations, patient history, and epidemiological information. If result is POSITIVE SARS-CoV-2 target nucleic acids are DETECTED. The SARS-CoV-2 RNA is generally detectable in upper and lower  respiratory specimens dur ing the acute phase of infection.  Positive  results are indicative of active infection with SARS-CoV-2.  Clinical  correlation with patient history and other diagnostic information is  necessary to determine patient infection status.  Positive results do  not rule out bacterial infection or co-infection with other viruses. If result is PRESUMPTIVE POSTIVE SARS-CoV-2 nucleic acids MAY BE PRESENT.   A presumptive positive result was obtained on the submitted specimen  and confirmed on repeat testing.  While 2019 novel coronavirus  (SARS-CoV-2) nucleic acids may be present in the submitted sample  additional confirmatory testing may be necessary for epidemiological  and / or  clinical management purposes  to differentiate between  SARS-CoV-2 and other Sarbecovirus currently known to infect humans.  If clinically indicated additional testing with an alternate test  methodology [phone]) is advised. The SARS-CoV-2 RNA is generally  detectable in upper and lower respiratory sp ecimens during the acute  phase of infection. The expected result is Negative. Fact Sheet for Patients:  StrictlyIdeas.no Fact Sheet for Healthcare Providers: BankingDealers.co.za This test is not yet approved or cleared by the [place] FDA and has been authorized for detection and/or diagnosis of SARS-CoV-2 by FDA under an Emergency Use Authorization (EUA).  This EUA will remain in effect (meaning this test can be used) for the duration of the COVID-19 declaration under Section 564(b)(1) of the Act, 21 U.S.C. section 360bbb-3(b)(1), unless the authorization is terminated or revoked sooner. Performed at [facility], [place] [address], [place], [place] [address]          Radiology Studies: Dg Chest Port 1 View  Result Date: 10/04/2018 CLINICAL DATA:  84 year old female with history of shortness of breath. EXAM: PORTABLE CHEST 1 VIEW COMPARISON:  Chest x-ray 05/05/2018. FINDINGS: Lung volumes are normal. No consolidative airspace disease. No pleural effusions. No evidence of pulmonary edema. Heart size is mildly enlarged. Upper mediastinal contours are within normal limits. Aortic atherosclerosis. Status post right shoulder arthroplasty. IMPRESSION: 1. No radiographic evidence of acute cardiopulmonary disease. 2. Mild cardiomegaly. 3. Aortic atherosclerosis. Electronically Signed   By: Vinnie Langton M.D.   On: 10/04/2018 22:05        Scheduled Meds: . buPROPion  150 mg Oral BID  . carvedilol  6.25 mg Oral BID WC  . doxycycline  100 mg Oral Q12H  . enoxaparin (LOVENOX) injection  40 mg Subcutaneous Q24H  .  gabapentin  100 mg Oral TID  . ipratropium-albuterol  3 mL Nebulization Q6H  . loratadine  10 mg Oral Daily  . losartan  50 mg Oral Daily  . memantine  10 mg Oral BID  . mometasone-formoterol  2 puff Inhalation BID  . potassium phosphate (monobasic)  500 mg Oral TID WC & HS  . pravastatin  80 mg Oral Daily  . predniSONE  40 mg Oral Q breakfast  . raloxifene  60 mg Oral Daily  . sodium chloride flush  3 mL Intravenous Q12H  . sodium chloride flush  3 mL Intravenous Q12H   Continuous Infusions: . sodium chloride       LOS: 1 day  Time spent: More than 50% of that time was spent in counseling and/or coordination of care.      Burnadette Pop, MD [facility] Pager [phone]  If 7PM-7AM, please contact night-coverage [email or web address] Password [email or web address] 10/05/2018, 12:02 PM

## 2018-10-05 NOTE — Progress Notes (Signed)
Spoke extensively with daughter [name] in front of patient regarding patients lab work, diagnosis and treatment plan.  Patient in no distress this morning, up with one assist to chair, maintaining oxygen level in mid to high 90's on 2 liters.

## 2018-10-05 NOTE — Evaluation (Signed)
Physical Therapy Evaluation Patient Details Name: [name] MRN: [id number] DOB: [birth year] Today's Date: 10/05/2018   History of Present Illness  84 y.o. female admitted with COPD exacerbation. Hx of HTN, CHF, Carotid Artery stenosis, HLD, dementia, asthma, PD GERD, COPD.   Clinical Impression  On eval, pt was Min guard assist for mobility. She walked ~75 feet with a RW. Pt fatigues fairly easily. Dyspnea 3/4 with activity. O2 sat level 92% on RA. Will continue to follow and progress activity as tolerated.     Follow Up Recommendations Home health PT    Equipment Recommendations  None recommended by PT    Recommendations for Other Services       Precautions / Restrictions Precautions Precautions: Fall Restrictions Weight Bearing Restrictions: No      Mobility  Bed Mobility               General bed mobility comments: oob in recliner  Transfers Overall transfer level: Needs assistance Equipment used: Rolling walker (2 wheeled) Transfers: Sit to/from Stand Sit to Stand: Min guard         General transfer comment: close guard for safety.  Ambulation/Gait Ambulation/Gait assistance: Min guard Gait Distance (Feet): 75 Feet Assistive device: Rolling walker (2 wheeled) Gait Pattern/deviations: Step-through pattern;Decreased stride length     General Gait Details: Pt fatigues easily. 1 standing rest break needed/taken after ~50 feet. Dyspnea 3/4.  O2 sat 92% on RA.   Stairs            Wheelchair Mobility    Modified Rankin (Stroke Patients Only)       Balance Overall balance assessment: Needs assistance         Standing balance support: Bilateral upper extremity supported Standing balance-Leahy Scale: Poor                               Pertinent Vitals/Pain Pain Assessment: No/denies pain    Home Living Family/patient expects to be discharged to:: Private residence Living Arrangements: Children(son is an amputee. Pt and  aide help care for him)   Type of Home: House Home Access: [place]: One level Home Equipment: [place] - 2 wheels;Cane - single point      Prior Function Level of Independence: Independent with assistive device(s)         Comments: pt stated she uses her walker mostly     Hand Dominance        Extremity/Trunk Assessment   Upper Extremity Assessment Upper Extremity Assessment: Defer to OT evaluation    Lower Extremity Assessment Lower Extremity Assessment: Generalized weakness    Cervical / Trunk Assessment Cervical / Trunk Assessment: Normal  Communication   Communication: No difficulties  Cognition Arousal/Alertness: Awake/alert Behavior During Therapy: WFL for tasks assessed/performed Overall Cognitive Status: Within Functional Limits for tasks assessed                                        General Comments      Exercises     Assessment/Plan    PT Assessment Patient needs continued PT services  PT Problem List Decreased balance;Decreased mobility;Decreased activity tolerance       PT Treatment Interventions Gait training;DME instruction;Functional mobility training;Therapeutic activities;Balance training;Patient/family education;Therapeutic exercise    PT Goals (Current goals can be found in the  Care Plan section)  Acute Rehab PT Goals Patient Stated Goal: to get better PT Goal Formulation: With patient Time For Goal Achievement: 10/19/18 Potential to Achieve Goals: Good    Frequency Min 3X/week   Barriers to discharge        Co-evaluation               AM-PAC PT "6 Clicks" Mobility  Outcome Measure Help needed turning from your back to your side while in a flat bed without using bedrails?: A Little Help needed moving from lying on your back to sitting on the side of a flat bed without using bedrails?: A Little Help needed moving to and from a bed to a chair (including a wheelchair)?: A  Little Help needed standing up from a chair using your arms (e.g., wheelchair or bedside chair)?: A Little Help needed to walk in hospital room?: A Little Help needed climbing 3-5 steps with a railing? : A Little 6 Click Score: 18    End of Session Equipment Utilized During Treatment: Gait belt Activity Tolerance: Patient limited by fatigue Patient left: in chair;with call bell/phone within reach   PT Visit Diagnosis: Unsteadiness on feet (R26.81);Difficulty in walking, not elsewhere classified (R26.2)    Time: 1033-1050 PT Time Calculation (min) (ACUTE ONLY): 17 min   Charges:   PT Evaluation $PT Eval Moderate Complexity: 1 Mod           Rebeca Alert, PT Acute Rehabilitation Services Pager: [phone] Office: [phone]

## 2018-10-05 NOTE — Plan of Care (Signed)
Patient able to maintain oxygen saturation in the mid 90's on room air even with ambulation however continues to feel short of breath with exertion.  Otherwise denies pain or other complaints.

## 2018-10-05 NOTE — ED Notes (Signed)
ED TO INPATIENT HANDOFF REPORT  ED Nurse Name and Phone #: Alexia Freestone RN  [id number] [phone]  S Name/Age/Gender [name] 84 y.o. female Room/Bed: WA20/WA20  Code Status   Code Status: Prior  Home/SNF/Other Home Patient oriented to: self, place and situation Is this baseline? Yes   Triage Complete: Triage complete  Chief Complaint SOB  Triage Note Patient is from home and presents with shortness of breath for about 1.5 hours. EMS noticed wheezing in the lung fields. He has a history of COPD and Asthma. EMS administered 3 duonebs, 125 mg of solumedrol and 2 g of magnesium.    EMS vitals: 97.2 Temp 97 HR 94% O2 sats on room air 175/84 BP       Allergies Allergies  Allergen Reactions  . Lisinopril-Hydrochlorothiazide Swelling, Other (See Comments) and Cough    Lip swelling and cough. Stopped by ENT. Patient is on Losartan without any issues   . Codeine Itching and Swelling    eye irritation (prescribed percocet)  . Penicillins Itching and Swelling    PATIENT HAS HAD A PCN REACTION WITH IMMEDIATE RASH, FACIAL/TONGUE/THROAT SWELLING, SOB, OR LIGHTHEADEDNESS WITH HYPOTENSION:  #  #  YES  #  #  Has patient had a PCN reaction causing severe rash involving mucus membranes or skin necrosis: No Has patient had a PCN reaction that required hospitalization No Has patient had a PCN reaction occurring within the last 10 years: No eye irritation  . Aricept [Donepezil Hcl] Nausea And Vomiting  . Namenda [Memantine Hcl] Nausea And Vomiting    Level of Care/Admitting Diagnosis ED Disposition    ED Disposition Condition Comment   Admit  Hospital Area: [facility]  HOSPITAL [[id number]]  Level of Care: Telemetry [5]  Admit to tele based on following criteria: Monitor QTC interval  Covid Evaluation: N/A  Diagnosis: COPD exacerbation (HCC) [431540]  Admitting Physician: Therisa Doyne [[id number]]  Attending Physician: Therisa Doyne [[id number]]  Estimated length of stay: 3 - 4  days  Certification:: I certify this patient will need inpatient services for at least 2 midnights  PT Class (Do Not Modify): Inpatient [101]  PT Acc Code (Do Not Modify): Private [1]       B Medical/Surgery History Past Medical History:  Diagnosis Date  . Allergic rhinitis   . Anxiety   . Arthritis   . Asthma   . CAP (community acquired pneumonia) 11/26/2017  . COPD (chronic obstructive pulmonary disease) (HCC)   . Depression   . Dyspnea    with exertion   . Elevated troponin 04/05/2016  . Essential hypertension, benign   . GERD (gastroesophageal reflux disease)   . Headache    occiptal neuralgia - left   . Heart murmur   . History of blood transfusion    "related to miscarriage"  . Left bundle branch block    in & out of A fib  . Memory loss   . Mixed hyperlipidemia   . Occipital neuralgia of left side   . Osteopenia   . 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 Urinary urgency    Past Surgical History:  Procedure Laterality Date  . APPENDECTOMY    . BACK SURGERY    . CARDIAC CATHETERIZATION  2010   Normal coronaries  . CATARACT EXTRACTION W/PHACO  04/16/2011   Procedure: CATARACT EXTRACTION PHACO AND INTRAOCULAR LENS PLACEMENT (IOC);  Surgeon: Susa Simmonds;  Location: [facility];  Service: Ophthalmology;  Laterality: Left;  CDE=11.35  .  CATARACT EXTRACTION W/PHACO  01/28/2012   Procedure: CATARACT EXTRACTION PHACO AND INTRAOCULAR LENS PLACEMENT (IOC);  Surgeon: Susa Simmondsarroll F Haines, MD;  Location: [facility];  Service: Ophthalmology;  Laterality: Right;  CDI:8.15  . CYSTO WITH HYDRODISTENSION  05/29/2012   Procedure: CYSTOSCOPY/HYDRODISTENSION;  Surgeon: Martina SinnerScott A MacDiarmid, MD;  [facility];  Service: Urology;  Laterality: N/A;  INSTILLATION OF MARCAINE AND PYRIDIUM   . DILATION AND CURETTAGE OF UTERUS  <hysterectomy"  . EYE SURGERY    . HAMMER TOE SURGERY Bilateral 11/22/11   [facility]  . JOINT REPLACEMENT    .  LAPAROSCOPIC CHOLECYSTECTOMY  1991  . LUMBAR LAMINECTOMY  2011  . LUMBAR LAMINECTOMY/DECOMPRESSION MICRODISCECTOMY Left 06/06/2012   Procedure: LUMBAR LAMINECTOMY/DECOMPRESSION MICRODISCECTOMY 1 LEVEL;  Surgeon: Maeola HarmanJoseph Stern, MD;  Location: [facility];  Service: Neurosurgery;  Laterality: Left;  Left Lumbar two-three Laminectomy for resection of synovial cyst  . SHOULDER ARTHROSCOPY Right 02/12/2018   SHOULDER ARTHROSCOPY, BICEPS DEBRIDEMENT, REMOVAL OF LOOSE BODIES  . SHOULDER ARTHROSCOPY Right 02/12/2018   Procedure: RIGHT SHOULDER ARTHROSCOPY, BICEPS DEBRIDEMENT, REMOVAL OF LOOSE BODIES;  Surgeon: Eldred MangesYates, Mark C, MD;  Location: [facility];  Service: Orthopedics;  Laterality: Right;  . SHOULDER OPEN ROTATOR CUFF REPAIR Right 2011  . TONSILLECTOMY    . TOTAL HIP ARTHROPLASTY Right 03/13/2013   Procedure: TOTAL HIP ARTHROPLASTY;  Surgeon: Nestor LewandowskyFrank J Rowan, MD;  Location: [facility];  Service: Orthopedics;  Laterality: Right;  . TOTAL SHOULDER ARTHROPLASTY Right 07/02/2018   Procedure: RIGHT TOTAL SHOULDER ARTHROPLASTY;  Surgeon: Eldred MangesYates, Mark C, MD;  Location: [facility];  Service: Orthopedics;  Laterality: Right;  Marland Kitchen. VAGINAL HYSTERECTOMY       A IV Location/Drains/Wounds Patient Lines/Drains/Airways Status   Active Line/Drains/Airways    Name:   Placement date:   Placement time:   Site:   Days:   Peripheral IV 10/04/18 Left Arm   10/04/18    2043    Arm   1   Incision (Closed) 07/02/18 Shoulder Right   07/02/18    1524     95          Intake/Output Last 24 hours No intake or output data in the 24 hours ending 10/05/18 0055  Labs/Imaging Results for orders placed or performed during the hospital encounter of 10/04/18 (from the past 48 hour(s))  Brain natriuretic peptide     Status: Abnormal   Collection Time: 10/04/18  9:34 PM  Result Value Ref Range   B Natriuretic Peptide 100.7 (H) 0.0 - 100.0 pg/mL    Comment: Performed at [facility], [address] [place], [place] [address]   Basic metabolic panel     Status: Abnormal   Collection Time: 10/04/18  9:35 PM  Result Value Ref Range   Sodium 144 135 - 145 mmol/L   Potassium 3.6 3.5 - 5.1 mmol/L   Chloride 110 98 - 111 mmol/L   CO2 25 22 - 32 mmol/L   Glucose, Bld 147 (H) 70 - 99 mg/dL   BUN 22 8 - 23 mg/dL   Creatinine, Ser 6.041.07 (H) 0.44 - 1.00 mg/dL   Calcium 9.2 8.9 - 54.010.3 mg/dL   GFR calc non Af Amer 49 (L) >60 mL/min   GFR calc Af Amer 56 (L) >60 mL/min   Anion gap 9 5 - 15    Comment: Performed at [facility], [address] [place], [place] [address]  CBC with Differential     Status: None  Collection Time: 10/04/18  9:35 PM  Result Value Ref Range   WBC 8.1 4.0 - 10.5 K/uL   RBC 4.61 3.87 - 5.11 MIL/uL   Hemoglobin 13.9 12.0 - 15.0 g/dL   HCT 95.0 93.2 - 67.1 %   MCV 92.4 80.0 - 100.0 fL   MCH 30.2 26.0 - 34.0 pg   MCHC 32.6 30.0 - 36.0 g/dL   RDW 24.5 80.9 - 98.3 %   Platelets 258 150 - 400 K/uL   nRBC 0.0 0.0 - 0.2 %   Neutrophils Relative % 78 %   Neutro Abs 6.3 1.7 - 7.7 K/uL   Lymphocytes Relative 13 %   Lymphs Abs 1.0 0.7 - 4.0 K/uL   Monocytes Relative 3 %   Monocytes Absolute 0.3 0.1 - 1.0 K/uL   Eosinophils Relative 5 %   Eosinophils Absolute 0.4 0.0 - 0.5 K/uL   Basophils Relative 1 %   Basophils Absolute 0.0 0.0 - 0.1 K/uL   Immature Granulocytes 0 %   Abs Immature Granulocytes 0.02 0.00 - 0.07 K/uL    Comment: Performed at [facility], [address], [place], [place] [address]  Troponin I - Once     Status: Abnormal   Collection Time: 10/04/18  9:35 PM  Result Value Ref Range   Troponin I 0.07 (HH) <0.03 ng/mL    Comment: CRITICAL RESULT CALLED TO, READ BACK BY AND VERIFIED WITH: WALTEN,S RN @2252  ON 10/04/2018 JACKSON,K Performed at [facility], [address], [place], [place] [address]   SARS Coronavirus 2 (CEPHEID- Performed in [facility] hospital lab), Hosp Order     Status: None   Collection Time: 10/04/18   9:35 PM  Result Value Ref Range   SARS Coronavirus 2 NEGATIVE NEGATIVE    Comment: (NOTE) If result is NEGATIVE SARS-CoV-2 target nucleic acids are NOT DETECTED. The SARS-CoV-2 RNA is generally detectable in upper and lower  respiratory specimens during the acute phase of infection. The lowest  concentration of SARS-CoV-2 viral copies this assay can detect is 250  copies / mL. A negative result does not preclude SARS-CoV-2 infection  and should not be used as the sole basis for treatment or other  patient management decisions.  A negative result may occur with  improper specimen collection / handling, submission of specimen other  than nasopharyngeal swab, presence of viral mutation(s) within the  areas targeted by this assay, and inadequate number of viral copies  (<250 copies / mL). A negative result must be combined with clinical  observations, patient history, and epidemiological information. If result is POSITIVE SARS-CoV-2 target nucleic acids are DETECTED. The SARS-CoV-2 RNA is generally detectable in upper and lower  respiratory specimens dur ing the acute phase of infection.  Positive  results are indicative of active infection with SARS-CoV-2.  Clinical  correlation with patient history and other diagnostic information is  necessary to determine patient infection status.  Positive results do  not rule out bacterial infection or co-infection with other viruses. If result is PRESUMPTIVE POSTIVE SARS-CoV-2 nucleic acids MAY BE PRESENT.   A presumptive positive result was obtained on the submitted specimen  and confirmed on repeat testing.  While 2019 novel coronavirus  (SARS-CoV-2) nucleic acids may be present in the submitted sample  additional confirmatory testing may be necessary for epidemiological  and / or clinical management purposes  to differentiate between  SARS-CoV-2 and other Sarbecovirus currently known to infect humans.  If clinically indicated additional  testing with an alternate test  methodology [phone]) is advised. The SARS-CoV-2 RNA is generally  detectable in upper and lower respiratory sp ecimens during the acute  phase of infection. The expected result is Negative. Fact Sheet for Patients:  StrictlyIdeas.no Fact Sheet for Healthcare Providers: BankingDealers.co.za This test is not yet approved or cleared by the [place] FDA and has been authorized for detection and/or diagnosis of SARS-CoV-2 by FDA under an Emergency Use Authorization (EUA).  This EUA will remain in effect (meaning this test can be used) for the duration of the COVID-19 declaration under Section 564(b)(1) of the Act, 21 U.S.C. section 360bbb-3(b)(1), unless the authorization is terminated or revoked sooner. Performed at [facility], [place] [address] [place], [place] [address]   Blood gas, venous     Status: Abnormal   Collection Time: 10/04/18  9:45 PM  Result Value Ref Range   FIO2 21.00    Delivery systems ROOM AIR    pH, Ven 7.350 7.250 - 7.430   pCO2, Ven 49.5 44.0 - 60.0 mmHg   pO2, Ven 51.7 (H) 32.0 - 45.0 mmHg   Bicarbonate 26.6 20.0 - 28.0 mmol/L   Acid-Base Excess 0.8 0.0 - 2.0 mmol/L   O2 Saturation 82.3 %   Patient temperature 98.6    Collection site VEIN    Drawn by COLLECTED BY NURSE    Sample type VENOUS     Comment: Performed at [facility], [place] [address] [place], [place] [address]  D-dimer, quantitative (not at [facility])     Status: Abnormal   Collection Time: 10/05/18 12:14 AM  Result Value Ref Range   D-Dimer, Quant 1.74 (H) 0.00 - 0.50 ug/mL-FEU    Comment: (NOTE) At the manufacturer cut-off of 0.50 ug/mL FEU, this assay has been documented to exclude PE with a sensitivity and negative predictive value of 97 to 99%.  At this time, this assay has not been approved by the FDA to exclude DVT/VTE. Results should be correlated with clinical  presentation. Performed at [facility], [place] [address], [place], [place] [address]    Dg Chest Port 1 View  Result Date: 10/04/2018 CLINICAL DATA:  84 year old female with history of shortness of breath. EXAM: PORTABLE CHEST 1 VIEW COMPARISON:  Chest x-ray 05/05/2018. FINDINGS: Lung volumes are normal. No consolidative airspace disease. No pleural effusions. No evidence of pulmonary edema. Heart size is mildly enlarged. Upper mediastinal contours are within normal limits. Aortic atherosclerosis. Status post right shoulder arthroplasty. IMPRESSION: 1. No radiographic evidence of acute cardiopulmonary disease. 2. Mild cardiomegaly. 3. Aortic atherosclerosis. Electronically Signed   By: Vinnie Langton M.D.   On: 10/04/2018 22:05    Pending Labs Unresulted Labs (From admission, onward)    Start     Ordered   10/04/18 2348  Troponin I - Now Then Q6H  Now then every 6 hours,   R     10/04/18 2347   Signed and Held  Magnesium  Tomorrow morning,   R    Comments:  Call MD if <1.5    Signed and Held   Signed and Held  Phosphorus  Tomorrow morning,   R     Signed and Held   Signed and Held  TSH  Once,   R    Comments:  Cancel if already done within 1 month and notify MD    Signed and Held   Signed and Held  Comprehensive metabolic panel  Once,   R  Comments:  Cal MD for K<3.5 or >5.0    Signed and Held   Signed and Held  CBC  Once,   R    Comments:  Call for hg <8.0    Signed and Held   Signed and Held  Magnesium  Add-on,   R     Signed and Held   Signed and Held  Culture, sputum-assessment  Once,   R    Question:  Patient immune status  Answer:  Normal   Signed and Held          Vitals/Pain Today's Vitals   10/04/18 2345 10/05/18 0000 10/05/18 0010 10/05/18 0030  BP:  (!) 186/59  (!) 171/66  Pulse: 93 85 (!) 58 64  Resp: (!) 22 (!) 25 (!) 21 19  Temp:    97.9 F (36.6 C)  TempSrc:      SpO2: 94% 92% 95% 96%    Isolation Precautions Droplet and  Contact precautions  Medications Medications  albuterol (VENTOLIN HFA) 108 (90 Base) MCG/ACT inhaler 4 puff (4 puffs Inhalation Given 10/04/18 2136)  aspirin chewable tablet 324 mg (324 mg Oral Given 10/04/18 2318)    Mobility walks with device Low fall risk   Focused Assessments Pulmonary Assessment Handoff:  Lung sounds:   O2 Device: Nasal Cannula O2 Flow Rate (L/min): 2 L/min      R Recommendations: See Admitting Provider Note  Report given to:   Additional Notes: copd exacerbation

## 2018-10-05 NOTE — Evaluation (Signed)
Occupational Therapy Evaluation Patient Details Name: [name] MRN: [id number] DOB: 08-13-1934 Today's Date: 10/05/2018    History of Present Illness [name] is a 84 y.o. female with medical history significant of HTN, CHF, Carotid Artery stenosis, HLD, dementia, asthma, PD GERD, COPD. Presented with SOB/weezing. Covid NEGATIVE 6/6.    Clinical Impression   PTA Pt mod I at home with RW. She does IADL for her and her son who is a double amputee. She also cares for him occasionally (he does have an aide 3xa week). Today Pt is min guard for transfers, in room mobility, and sink level grooming. She is dependent on BUE during functional standing ADL and gets SOB however O2 saturations remain above 94% throughout session on RA. OT will continue to follow acutely with next session to focus on energy conservation. She will need handout. Do not anticipate need for post-acute follow up at this time.    Follow Up Recommendations  Supervision - Intermittent;No OT follow up    Equipment Recommendations  None recommended by OT(Pt has appropriate DME)    Recommendations for Other Services       Precautions / Restrictions Precautions Precautions: Fall Restrictions Weight Bearing Restrictions: No      Mobility Bed Mobility               General bed mobility comments: OOB at beginning and end of session  Transfers Overall transfer level: Needs assistance Equipment used: Rolling walker (2 wheeled) Transfers: Sit to/from Stand Sit to Stand: Min guard         General transfer comment: close guard for safety.    Balance Overall balance assessment: Needs assistance Sitting-balance support: No upper extremity supported;Feet supported Sitting balance-Leahy Scale: Good     Standing balance support: Bilateral upper extremity supported Standing balance-Leahy Scale: Poor Standing balance comment: dependent on RW or BUE support                           ADL  either performed or assessed with clinical judgement   ADL Overall ADL's : Needs assistance/impaired Eating/Feeding: Modified independent   Grooming: Min guard;Wash/dry hands;Wash/dry face;Oral care;Standing Grooming Details (indicate cue type and reason): sink level, leans on BUE at sink and takes rest breaks  Upper Body Bathing: Set up;Sitting   Lower Body Bathing: Min guard;Sitting/lateral leans   Upper Body Dressing : Sitting;Modified independent   Lower Body Dressing: Min guard;Sit to/from stand Lower Body Dressing Details (indicate cue type and reason): able to don/doff underwear and pajama pants Toilet Transfer: Min guard;Ambulation;RW   Toileting- Clothing Manipulation and Hygiene: Modified independent;Sit to/from stand Toileting - Clothing Manipulation Details (indicate cue type and reason): perianal care performed with warm wash cloth Tub/ Shower Transfer: Tub transfer;Supervision/safety;Ambulation;Rolling walker;Shower seat   Functional mobility during ADLs: Min guard;Rolling walker General ADL Comments: SOB but O2 saturations >94 throughout session     Vision Patient Visual Report: No change from baseline       Perception     Praxis      Pertinent Vitals/Pain Pain Assessment: No/denies pain     Hand Dominance Right   Extremity/Trunk Assessment Upper Extremity Assessment Upper Extremity Assessment: Overall WFL for tasks assessed   Lower Extremity Assessment Lower Extremity Assessment: Defer to PT evaluation   Cervical / Trunk Assessment Cervical / Trunk Assessment: Normal   Communication Communication Communication: No difficulties   Cognition Arousal/Alertness: Awake/alert Behavior During Therapy: WFL for tasks assessed/performed Overall  Cognitive Status: Within Functional Limits for tasks assessed                                     General Comments  on RA throughout session, SOB but O2 remained >94% throughout session     Exercises     Shoulder Instructions      Home Living Family/patient expects to be discharged to:: Private residence Living Arrangements: Children(son is a double amputee, has aide 3x a week, Pt does IADL) Available Help at Discharge: Family;Available PRN/intermittently Type of Home: House Home Access: Ramped entrance     Home Layout: One level     Bathroom Shower/Tub: Chief Strategy Officer: Standard Bathroom Accessibility: Yes How Accessible: Accessible via wheelchair Home Equipment: Walker - 2 wheels;Cane - single point;Shower seat          Prior Functioning/Environment Level of Independence: Independent with assistive device(s)        Comments: pt stated she uses her walker mostly        OT Problem List: Decreased activity tolerance;Impaired balance (sitting and/or standing);Decreased safety awareness;Decreased knowledge of use of DME or AE;Cardiopulmonary status limiting activity      OT Treatment/Interventions: Self-care/ADL training;Energy conservation;DME and/or AE instruction;Therapeutic activities;Patient/family education;Balance training    OT Goals(Current goals can be found in the care plan section) Acute Rehab OT Goals Patient Stated Goal: to get better OT Goal Formulation: With patient Time For Goal Achievement: 10/19/18 Potential to Achieve Goals: Good ADL Goals Pt Will Perform Grooming: with modified independence;standing Pt Will Transfer to Toilet: with modified independence;ambulating Pt Will Perform Toileting - Clothing Manipulation and hygiene: with modified independence;sit to/from stand Pt Will Perform Tub/Shower Transfer: with modified independence;ambulating;shower seat;rolling walker Additional ADL Goal #1: Pt will recall 3 ways of conserving energy throughout ADL at independent level  OT Frequency: Min 2X/week   Barriers to D/C:            Co-evaluation              AM-PAC OT "6 Clicks" Daily Activity      Outcome Measure Help from another person eating meals?: None Help from another person taking care of personal grooming?: A Little Help from another person toileting, which includes using toliet, bedpan, or urinal?: A Little Help from another person bathing (including washing, rinsing, drying)?: A Little Help from another person to put on and taking off regular upper body clothing?: None Help from another person to put on and taking off regular lower body clothing?: None 6 Click Score: 21   End of Session Equipment Utilized During Treatment: Gait belt;Rolling walker Nurse Communication: Mobility status;Precautions;Other (comment)(O2 levels, no chair alarm, bathing)  Activity Tolerance: Patient tolerated treatment well Patient left: in chair;with call bell/phone within reach(wash bin for giving herself a bath)  OT Visit Diagnosis: Unsteadiness on feet (R26.81);Muscle weakness (generalized) (M62.81)                Time: 2671-2458 OT Time Calculation (min): 25 min Charges:  OT General Charges $OT Visit: 1 Visit OT Evaluation $OT Eval Moderate Complexity: 1 Mod OT Treatments $Self Care/Home Management : 8-22 mins  Sherryl Manges OTR/L Acute Rehabilitation Services Pager: [phone] Office: [phone]  Evern Bio Rayma Hegg 10/05/2018, 12:33 PM

## 2018-10-06 LAB — PHOSPHORUS: Phosphorus: 2.4 mg/dL — ABNORMAL LOW (ref 2.5–4.6)

## 2018-10-06 LAB — GLUCOSE, CAPILLARY: Glucose-Capillary: 70 mg/dL (ref 70–99)

## 2018-10-06 MED ORDER — PREDNISONE 20 MG PO TABS: 40.0000 mg | ORAL_TABLET | Freq: Every day | ORAL | 0 refills | Status: DC

## 2018-10-06 MED ORDER — DOXYCYCLINE HYCLATE 100 MG PO TABS: 100.0000 mg | ORAL_TABLET | Freq: Two times a day (BID) | ORAL | 0 refills | Status: AC

## 2018-10-06 NOTE — TOC Initial Note (Signed)
Transition of Care [facility]) - Initial/Assessment Note    Patient Details  Name: [name] MRN: [id number] Date of Birth: 1935-03-09  Transition of [facility]) CM/SW Contact:    [name] Charon, LCSW Phone Number: 10/06/2018, 12:32 PM  Clinical Narrative:     Received consult for [facility] needs for patient. Patient at bedside stated that she would prefer [facility]. CSW reached out to [name] Braun from [facility] to see if they will be able to take patient in the home             Expected Discharge Plan: Home w Home Health Services Barriers to Discharge: No Barriers Identified   Patient Goals and CMS Choice   CMS Medicare.gov Compare Post Acute Care list provided to:: Patient(pt preferred [facility]) Choice offered to / list presented to : Patient  Expected Discharge Plan and Services Expected Discharge Plan: Home w Home Health Services       Living arrangements for the past 2 months: Single Family Home Expected Discharge Date: 10/06/18                           [facility] Agency: ([facility]) Date HH Agency Contacted: 10/06/18 Time HH Agency Contacted: 1231 Representative spoke with at [facility] Agency: [name]  Prior Living Arrangements/Services Living arrangements for the past 2 months: Single Family Home Lives with:: Self Patient language and need for interpreter reviewed:: Yes Do you feel safe going back to the place where you live?: Yes      Need for Family Participation in Patient Care: Yes (Comment) Care giver support system in place?: Yes (comment)      Activities of Daily Living Home Assistive Devices/Equipment: Eyeglasses, Dentures (specify type), Walker (specify type), Shower chair without back, Oxygen(pt only uses O2 prn) ADL Screening (condition at time of admission) Patient's cognitive ability adequate to safely complete daily activities?: Yes Is the patient deaf or have difficulty hearing?: No Does the patient have difficulty seeing, even when wearing glasses/contacts?:  No Does the patient have difficulty concentrating, remembering, or making decisions?: Yes Patient able to express need for assistance with ADLs?: Yes Does the patient have difficulty dressing or bathing?: No Independently performs ADLs?: Yes (appropriate for developmental age) Does the patient have difficulty walking or climbing stairs?: No Weakness of Legs: None Weakness of Arms/Hands: None  Permission Sought/Granted Permission sought to share information with : Family Supports Permission granted to share information with : Yes, Verbal Permission Granted  Share Information with NAME: [name]      Permission granted to share info w Relationship: [phone]  Permission granted to share info w Contact Information: daughter  Emotional Assessment Appearance:: Appears stated age   Affect (typically observed): Accepting Orientation: : Oriented to Self, Oriented to Place, Oriented to  Time, Oriented to Situation Alcohol / Substance Use: Not Applicable Psych Involvement: No (comment)  Admission diagnosis:  Acute respiratory distress [R06.03] COPD exacerbation (HCC) [J44.1] Patient Active Problem List   Diagnosis Date Noted  . Prolonged QT interval 10/05/2018  . Elevated troponin 10/05/2018  . Hot flashes due to menopause 09/30/2018  . H/O total shoulder replacement, right 07/17/2018  . Shoulder arthritis 07/02/2018  . Dementia without behavioral disturbance (HCC) 05/29/2018  . SOB (shortness of breath) 04/22/2018  . COPD exacerbation (HCC)   . Occipital neuralgia of left side 03/20/2018  . Glenohumeral arthritis, right 02/12/2018  . Tobacco use 12/24/2017  . GOLD COPD 0 with Asthmatic Component    .  Biceps tendinosis of right shoulder 08/04/2017  . Pre-operative cardiovascular examination 08/01/2017  . Carotid artery disease ([place]) 07/03/2017  . DOE (dyspnea on exertion) 07/03/2017  . Chronic right shoulder pain 12/27/2016  . Constipation 04/11/2016  . Chronic diastolic  heart failure ([place]) 04/07/2016  . Hyperglycemia 04/05/2016  . Syncope 11/15/2015  . Dehydration 10/05/2015  . UTI (lower urinary tract infection) 10/05/2015  . Mild dementia ([place]) 09/13/2015  . Memory problem 05/07/2015  . Neck pain 04/06/2015  . Insomnia 12/10/2014  . BMI 28.0-28.9,adult 12/10/2014  . Osteopenia 05/19/2014  . GAD (generalized anxiety disorder) 01/06/2014  . Depression 01/06/2014  . GERD (gastroesophageal reflux disease) 01/06/2014  . Vitamin D deficiency 01/06/2014  . Overactive bladder 01/06/2014  . Secondary cardiomyopathy ([place]) 10/06/2013  . Left bundle branch block 10/06/2013  . Dizziness 10/04/2013  . Osteoarthritis of right hip 03/14/2013  . Cough 02/01/2011  . Reflux 02/01/2011  . Allergic rhinitis 12/07/2008  . Chronic cough 12/07/2008  . Hepatitis B virus infection 08/16/2008  . Hyperlipidemia 08/16/2008  . Essential hypertension 08/16/2008  . [place] DEGENERATION 07/24/2007   PCP:  [name] Gouty, FNP Pharmacy:   [name], [address] [place] [address] Phone: [phone] Fax: [phone]  [id number] [address], [address] [place] [address] Phone: [phone] Fax: [phone]     Social Determinants of Health (SDOH) Interventions    Readmission Risk Interventions No flowsheet data found.

## 2018-10-06 NOTE — Discharge Summary (Signed)
Physician Discharge Summary  [name] UJW:[id number] DOB: 1934/08/18 DOA: 10/04/2018  PCP: Sonny Masters, FNP  Admit date: 10/04/2018 Discharge date: 10/06/2018  Admitted From: Home Disposition:  Home  Discharge Condition:Stable CODE STATUS:FULL Diet recommendation: Heart Healthy  Brief/Interim Summary: Patient is 84 year old female with history of hypertension, CHF, carotid artery stenosis, hyperlipidemia, dementia, asthma who presents to the emergency department with shortness of breath and wheezing.  She is a past smoker.  She has an oxygen tank at home but uses only when necessary.  She was saturating 92% on room air on arrival.  Also complained of chest pain denies any abdomen pain or leg swelling.  D-dimer found to elevated.  Patient was initially admitted for the management of possible COPD exacerbation.Her last Admission was in December 2019 for COPD exacerbation.She follows with pulmonology as an outpatient for the management of her asthma.  Covid-19 negative. Patient's respiratory status gradually improved.  Currently she is saturating on room air without oxygen.  Feels better.  Stable for discharge to home.  Following problems were addressed during her  Hospitalization:  COPD versus asthma exacerbation: Has no clear documentation of COPD as per pulmonology note as an outpatient.  She follows with Dr. Isaiah Serge. She presented with shortness of breath, wheezing.  She has history of asthma.  Started on steroids which we will continue.  Also started on antibiotics.  Continue bronchodilators.  Currently on 1 L of oxygen per minute but can maintain her saturation in mid 90s without oxygen. She has oxygen tank at home and uses only if necessary. She is a past smoker.  Elevated d-dimer:  CT chest angiogram did not show  pulmonary embolism.  She does not have any leg swelling.    Chronic diastolic heart failure: Currently euvolemic.  Appeared dry on presentation.  Home diuretics on hold.   She is on Lasix at home which she can continue on discharge.  Hypertension: Currently blood stable.  Continue home meds  Hyperlipidemia: Continue home medicines  Prolonged QT interval: QT of 531 which improved to 492 today.Avoid QT prolonging medications.  Elevated troponin: Most likely secondary to supply demand ischemia.  Troponins flat.  Hypophosphatemia: Supplemented.     Discharge Diagnoses:  Active Problems:   Hyperlipidemia   Essential hypertension   Chronic diastolic heart failure (HCC)   Carotid artery disease (HCC)   COPD exacerbation (HCC)   Dementia without behavioral disturbance (HCC)   Prolonged QT interval   Elevated troponin    Discharge Instructions  Discharge Instructions    Diet - low sodium heart healthy   Complete by:  As directed    Discharge instructions   Complete by:  As directed    1) Please take prescribed medications as instructed. 2)Follow up with your PCP in a week.   Increase activity slowly   Complete by:  As directed      Allergies as of 10/06/2018      Reactions   Lisinopril-hydrochlorothiazide Swelling, Other (See Comments), Cough   Lip swelling and cough. Stopped by ENT. Patient is on Losartan without any issues    Codeine Itching, Swelling   eye irritation (prescribed percocet)   Penicillins Itching, Swelling   PATIENT HAS HAD A PCN REACTION WITH IMMEDIATE RASH, FACIAL/TONGUE/THROAT SWELLING, SOB, OR LIGHTHEADEDNESS WITH HYPOTENSION:  #  #  YES  #  #  Has patient had a PCN reaction causing severe rash involving mucus membranes or skin necrosis: No Has patient had a PCN reaction that required hospitalization  No Has patient had a PCN reaction occurring within the last 10 years: No eye irritation   Aricept [donepezil Hcl] Nausea And Vomiting   Namenda [memantine Hcl] Nausea And Vomiting      Medication List    STOP taking these medications   azelastine 0.1 % nasal spray Commonly known as:  ASTELIN   esomeprazole 40 MG  capsule Commonly known as:  NEXIUM   HYDROcodone-acetaminophen 7.5-325 MG tablet Commonly known as:  Norco   potassium chloride SA 20 MEQ tablet Commonly known as:  K-DUR     TAKE these medications   AeroChamber MV inhaler Use as instructed   albuterol 108 (90 Base) MCG/ACT inhaler Commonly known as:  VENTOLIN HFA Inhale 1-2 puffs into the lungs every 6 (six) hours as needed for wheezing or shortness of breath.   albuterol (2.5 MG/3ML) 0.083% nebulizer solution Commonly known as:  PROVENTIL Take 2.5 mg by nebulization every 6 (six) hours as needed for wheezing or shortness of breath.   aspirin EC 81 MG tablet Take 81 mg by mouth every evening.   Biotin 5 MG Tabs Take 5 mg by mouth every morning.   budesonide-formoterol 160-4.5 MCG/ACT inhaler Commonly known as:  Symbicort TAKE 2 PUFFS BY MOUTH TWICE A DAY What changed:    how much to take  how to take this  when to take this  additional instructions   buPROPion 150 MG 12 hr tablet Commonly known as:  WELLBUTRIN SR TAKE (1) TABLET TWICE A DAY. What changed:    how much to take  how to take this  when to take this  additional instructions   carvedilol 6.25 MG tablet Commonly known as:  COREG TAKE  (1)  TABLET TWICE A DAY WITH MEALS (BREAKFAST AND SUPPER) What changed:    how much to take  how to take this  when to take this  additional instructions   CENTRUM SILVER PO Take 1 tablet by mouth every morning.   doxycycline 100 MG tablet Commonly known as:  VIBRA-TABS Take 1 tablet (100 mg total) by mouth every 12 (twelve) hours for 3 days.   furosemide 20 MG tablet Commonly known as:  LASIX TAKE 2 TABLETS DAILY FOR 2 DAYS, THEN DECREASE BACK TO 1 DAILY What changed:  See the new instructions.   gabapentin 100 MG capsule Commonly known as:  NEURONTIN Take 1 capsule (100 mg total) by mouth 3 (three) times daily.   loratadine 10 MG tablet Commonly known as:  CLARITIN Take 1 tablet (10 mg  total) by mouth daily.   losartan 50 MG tablet Commonly known as:  COZAAR TAKE (1/2) TABLET DAILY. What changed:    how much to take  how to take this  when to take this   memantine 10 MG tablet Commonly known as:  Namenda Take 1 tablet (10 mg total) by mouth 2 (two) times daily for 30 days.   omeprazole 40 MG capsule Commonly known as:  PRILOSEC Take 40 mg by mouth daily.   ondansetron 4 MG tablet Commonly known as:  ZOFRAN Take 1 tablet (4 mg total) by mouth every 8 (eight) hours as needed for nausea or vomiting.   phentermine 37.5 MG tablet Commonly known as:  ADIPEX-P Take 37.5 mg by mouth daily before breakfast.   pravastatin 80 MG tablet Commonly known as:  PRAVACHOL Take 1 tablet (80 mg total) by mouth daily.   predniSONE 20 MG tablet Commonly known as:  DELTASONE Take 2 tablets (40  mg total) by mouth daily with breakfast. Start taking on:  October 07, 2018   raloxifene 60 MG tablet Commonly known as:  EVISTA TAKE 1 TABLET DAILY       Allergies  Allergen Reactions  . Lisinopril-Hydrochlorothiazide Swelling, Other (See Comments) and Cough    Lip swelling and cough. Stopped by ENT. Patient is on Losartan without any issues   . Codeine Itching and Swelling    eye irritation (prescribed percocet)  . Penicillins Itching and Swelling    PATIENT HAS HAD A PCN REACTION WITH IMMEDIATE RASH, FACIAL/TONGUE/THROAT SWELLING, SOB, OR LIGHTHEADEDNESS WITH HYPOTENSION:  #  #  YES  #  #  Has patient had a PCN reaction causing severe rash involving mucus membranes or skin necrosis: No Has patient had a PCN reaction that required hospitalization No Has patient had a PCN reaction occurring within the last 10 years: No eye irritation  . Aricept [Donepezil Hcl] Nausea And Vomiting  . Namenda [Memantine Hcl] Nausea And Vomiting    Consultations:  None   Procedures/Studies: Ct Angio Chest Pe W Or Wo Contrast  Result Date: 10/05/2018 CLINICAL DATA:  Shortness of breath  EXAM: CT ANGIOGRAPHY CHEST WITH CONTRAST TECHNIQUE: Multidetector CT imaging of the chest was performed using the standard protocol during bolus administration of intravenous contrast. Multiplanar CT image reconstructions and MIPs were obtained to evaluate the vascular anatomy. CONTRAST:  OMNIPAQUE IOHEXOL 350 MG/ML SOLN COMPARISON:  Chest radiograph May 05, 2018; chest radiograph October 04, 2018 FINDINGS: Cardiovascular: There is no demonstrable pulmonary embolus. There is no thoracic aortic aneurysm or dissection. The visualized great vessels appear unremarkable. There is aortic atherosclerosis. There is no pericardial effusion or pericardial thickening. The main pulmonary outflow tract measures 3.4 cm in diameter, prominent. Mediastinum/Nodes: Thyroid appears diminutive. There are subcentimeter mediastinal lymph nodes. There is no adenopathy in the thoracic region by size criteria. There is a small hiatal hernia. Lungs/Pleura: There is patchy atelectatic change in each lower lobe. There is no evident edema or consolidation. On axial slice 57 series 10, there is a stable 4 mm nodular opacity in the anterior segment of the right upper lobe. There is no evident pleural effusion or pleural thickening. Upper Abdomen: There is aortic atherosclerosis in the visualized upper abdomen. Gallbladder is absent. There is a calcification in the spleen, likely indicative of prior granulomatous disease. Musculoskeletal: There is a small hernia along the posterior right hemidiaphragm containing only fat, stable. There are no blastic or lytic bone lesions. There is a shoulder replacement on the right. There is advanced arthropathy in the left shoulder. There are no chest wall lesions evident. Review of the MIP images confirms the above findings. IMPRESSION: 1. No demonstrable pulmonary embolus. No thoracic aortic aneurysm or dissection. There is aortic atherosclerosis. 2. Prominence of the main pulmonary outflow tract,  indicative of pulmonary arterial hypertension. 3. Areas of bilateral lower lobe atelectasis. No frank edema or consolidation. Stable 4 mm nodular opacity right upper lobe. 4.  Small hiatal hernia. 5.  No adenopathy by size criteria. 6.  Small posterior right diaphragmatic hernia containing only fat. Aortic Atherosclerosis (ICD10-I70.0). Electronically Signed   By: Bretta Bang III M.D.   On: 10/05/2018 14:20   Dg Chest Port 1 View  Result Date: 10/04/2018 CLINICAL DATA:  84 year old female with history of shortness of breath. EXAM: PORTABLE CHEST 1 VIEW COMPARISON:  Chest x-ray 05/05/2018. FINDINGS: Lung volumes are normal. No consolidative airspace disease. No pleural effusions. No evidence of pulmonary edema.  Heart size is mildly enlarged. Upper mediastinal contours are within normal limits. Aortic atherosclerosis. Status post right shoulder arthroplasty. IMPRESSION: 1. No radiographic evidence of acute cardiopulmonary disease. 2. Mild cardiomegaly. 3. Aortic atherosclerosis. Electronically Signed   By: Trudie Reedaniel  Entrikin M.D.   On: 10/04/2018 22:05       Subjective: Patient seen and examined the bedside this morning.  Appears comfortable, hemodynamically stable.  Respiratory status stable .Stable for discharge.  Discharge Exam: Vitals:   10/06/18 0528 10/06/18 0735  BP: (!) 150/80   Pulse: (!) 57   Resp: 18   Temp: 98.1 F (36.7 C)   SpO2: 96% 95%   Vitals:   10/05/18 2207 10/06/18 0147 10/06/18 0528 10/06/18 0735  BP: (!) 147/60  (!) 150/80   Pulse: 85  (!) 57   Resp: 18  18   Temp: 98.2 F (36.8 C)  98.1 F (36.7 C)   TempSrc: Oral  Oral   SpO2: 97% 98% 96% 95%  Weight:      Height:        General: Pt is alert, awake, not in acute distress Cardiovascular: RRR, S1/S2 +, no rubs, no gallops Respiratory: CTA bilaterally, no wheezing, no rhonchi Abdominal: Soft, NT, ND, bowel sounds + Extremities: no edema, no cyanosis    The results of significant diagnostics from  this hospitalization (including imaging, microbiology, ancillary and laboratory) are listed below for reference.     Microbiology: Recent Results (from the past 240 hour(s))  SARS Coronavirus 2 (CEPHEID- Performed in [facility] hospital lab), Hosp Order     Status: None   Collection Time: 10/04/18  9:35 PM  Result Value Ref Range Status   SARS Coronavirus 2 NEGATIVE NEGATIVE Final    Comment: (NOTE) If result is NEGATIVE SARS-CoV-2 target nucleic acids are NOT DETECTED. The SARS-CoV-2 RNA is generally detectable in upper and lower  respiratory specimens during the acute phase of infection. The lowest  concentration of SARS-CoV-2 viral copies this assay can detect is 250  copies / mL. A negative result does not preclude SARS-CoV-2 infection  and should not be used as the sole basis for treatment or other  patient management decisions.  A negative result may occur with  improper specimen collection / handling, submission of specimen other  than nasopharyngeal swab, presence of viral mutation(s) within the  areas targeted by this assay, and inadequate number of viral copies  (<250 copies / mL). A negative result must be combined with clinical  observations, patient history, and epidemiological information. If result is POSITIVE SARS-CoV-2 target nucleic acids are DETECTED. The SARS-CoV-2 RNA is generally detectable in upper and lower  respiratory specimens dur ing the acute phase of infection.  Positive  results are indicative of active infection with SARS-CoV-2.  Clinical  correlation with patient history and other diagnostic information is  necessary to determine patient infection status.  Positive results do  not rule out bacterial infection or co-infection with other viruses. If result is PRESUMPTIVE POSTIVE SARS-CoV-2 nucleic acids MAY BE PRESENT.   A presumptive positive result was obtained on the submitted specimen  and confirmed on repeat testing.  While 2019 novel  coronavirus  (SARS-CoV-2) nucleic acids may be present in the submitted sample  additional confirmatory testing may be necessary for epidemiological  and / or clinical management purposes  to differentiate between  SARS-CoV-2 and other Sarbecovirus currently known to infect humans.  If clinically indicated additional testing with an alternate test  methodology [phone]([id number]) is advised. The  SARS-CoV-2 RNA is generally  detectable in upper and lower respiratory sp ecimens during the acute  phase of infection. The expected result is Negative. Fact Sheet for Patients:  StrictlyIdeas.no Fact Sheet for Healthcare Providers: BankingDealers.co.za This test is not yet approved or cleared by the [place] FDA and has been authorized for detection and/or diagnosis of SARS-CoV-2 by FDA under an Emergency Use Authorization (EUA).  This EUA will remain in effect (meaning this test can be used) for the duration of the COVID-19 declaration under Section 564(b)(1) of the Act, 21 U.S.C. section 360bbb-3(b)(1), unless the authorization is terminated or revoked sooner. Performed at [facility], [place] [address], [place], [place] [address]      Labs: BNP (last 3 results) Recent Labs    04/21/18 0425 08/04/18 1425 10/04/18 2134  BNP 187.6* 191.2* 778.2*   Basic Metabolic Panel: Recent Labs  Lab 09/30/18 0843 10/04/18 2135 10/05/18 0014 10/05/18 0420 10/06/18 0418  NA 144 144  --  142  --   K 4.1 3.6  --  4.5  --   CL 108* 110  --  113*  --   CO2 21 25  --  19*  --   GLUCOSE 98 147*  --  150*  --   BUN 15 22  --  21  --   CREATININE 1.16* 1.07*  --  0.99  --   CALCIUM 9.4 9.2  --  8.8*  --   MG  --   --  3.0* 2.5*  --   PHOS  --   --   --  1.5* 2.4*   Liver Function Tests: Recent Labs  Lab 09/30/18 0843 10/05/18 0420  AST 27 33  ALT 19 26  ALKPHOS 105 92  BILITOT 0.2 <0.1*  PROT 6.6 6.7  ALBUMIN 4.3 3.6   No  results for input(s): LIPASE, AMYLASE in the last 168 hours. No results for input(s): AMMONIA in the last 168 hours. CBC: Recent Labs  Lab 09/30/18 0843 10/04/18 2135 10/05/18 0420  WBC 7.8 8.1 5.2  NEUTROABS 4.9 6.3  --   HGB 14.3 13.9 13.5  HCT 44.6 42.6 41.8  MCV 93 92.4 93.5  PLT 277 258 259   Cardiac Enzymes: Recent Labs  Lab 10/04/18 2135 10/05/18 0014 10/05/18 0420  TROPONINI 0.07* 0.07* 0.08*   BNP: Invalid input(s): POCBNP CBG: Recent Labs  Lab 10/06/18 0731  GLUCAP 70   D-Dimer Recent Labs    10/05/18 0014  DDIMER 1.74*   Hgb A1c No results for input(s): HGBA1C in the last 72 hours. Lipid Profile No results for input(s): CHOL, HDL, LDLCALC, TRIG, CHOLHDL, LDLDIRECT in the last 72 hours. Thyroid function studies Recent Labs    10/05/18 0420  TSH 0.325*   Anemia work up No results for input(s): VITAMINB12, FOLATE, FERRITIN, TIBC, IRON, RETICCTPCT in the last 72 hours. Urinalysis    Component Value Date/Time   COLORURINE YELLOW 04/05/2016 1654   APPEARANCEUR HAZY (A) 04/05/2016 1654   APPEARANCEUR Clear 10/28/2015 1622   LABSPEC 1.039 (H) 04/05/2016 1654   PHURINE 8.0 04/05/2016 1654   GLUCOSEU NEGATIVE 04/05/2016 1654   HGBUR NEGATIVE 04/05/2016 1654   BILIRUBINUR NEGATIVE 04/05/2016 1654   BILIRUBINUR Negative 10/28/2015 1622   KETONESUR NEGATIVE 04/05/2016 1654   PROTEINUR NEGATIVE 04/05/2016 1654   UROBILINOGEN 0.2 10/04/2013 1457   NITRITE NEGATIVE 04/05/2016 1654   LEUKOCYTESUR NEGATIVE 04/05/2016 1654   LEUKOCYTESUR Trace (A) 10/28/2015 1622   Sepsis Labs Invalid input(s):  PROCALCITONIN,  WBC,  LACTICIDVEN Microbiology Recent Results (from the past 240 hour(s))  SARS Coronavirus 2 (CEPHEID- Performed in [facility] hospital lab), Hosp Order     Status: None   Collection Time: 10/04/18  9:35 PM  Result Value Ref Range Status   SARS Coronavirus 2 NEGATIVE NEGATIVE Final    Comment: (NOTE) If result is NEGATIVE SARS-CoV-2 target  nucleic acids are NOT DETECTED. The SARS-CoV-2 RNA is generally detectable in upper and lower  respiratory specimens during the acute phase of infection. The lowest  concentration of SARS-CoV-2 viral copies this assay can detect is 250  copies / mL. A negative result does not preclude SARS-CoV-2 infection  and should not be used as the sole basis for treatment or other  patient management decisions.  A negative result may occur with  improper specimen collection / handling, submission of specimen other  than nasopharyngeal swab, presence of viral mutation(s) within the  areas targeted by this assay, and inadequate number of viral copies  (<250 copies / mL). A negative result must be combined with clinical  observations, patient history, and epidemiological information. If result is POSITIVE SARS-CoV-2 target nucleic acids are DETECTED. The SARS-CoV-2 RNA is generally detectable in upper and lower  respiratory specimens dur ing the acute phase of infection.  Positive  results are indicative of active infection with SARS-CoV-2.  Clinical  correlation with patient history and other diagnostic information is  necessary to determine patient infection status.  Positive results do  not rule out bacterial infection or co-infection with other viruses. If result is PRESUMPTIVE POSTIVE SARS-CoV-2 nucleic acids MAY BE PRESENT.   A presumptive positive result was obtained on the submitted specimen  and confirmed on repeat testing.  While 2019 novel coronavirus  (SARS-CoV-2) nucleic acids may be present in the submitted sample  additional confirmatory testing may be necessary for epidemiological  and / or clinical management purposes  to differentiate between  SARS-CoV-2 and other Sarbecovirus currently known to infect humans.  If clinically indicated additional testing with an alternate test  methodology [phone]) is advised. The SARS-CoV-2 RNA is generally  detectable in upper and lower  respiratory sp ecimens during the acute  phase of infection. The expected result is Negative. Fact Sheet for Patients:  BoilerBrush.com.cyhttps://www.fda.gov/media/136312/download Fact Sheet for Healthcare Providers: https://pope.com/https://www.fda.gov/media/136313/download This test is not yet approved or cleared by the [place] FDA and has been authorized for detection and/or diagnosis of SARS-CoV-2 by FDA under an Emergency Use Authorization (EUA).  This EUA will remain in effect (meaning this test can be used) for the duration of the COVID-19 declaration under Section 564(b)(1) of the Act, 21 U.S.C. section 360bbb-3(b)(1), unless the authorization is terminated or revoked sooner. Performed at [facility], [address] [place], [place] [address]     Please note: You were cared for by a hospitalist during your hospital stay. Once you are discharged, your primary care physician will handle any further medical issues. Please note that NO REFILLS for any discharge medications will be authorized once you are discharged, as it is imperative that you return to your primary care physician (or establish a relationship with a primary care physician if you do not have one) for your post hospital discharge needs so that they can reassess your need for medications and monitor your lab values.    Time coordinating discharge: 40 minutes  SIGNED:   Burnadette PopAmrit Shanikka Wonders, MD  [facility] 10/06/2018, 11:35 AM Pager [phone]  If 7PM-7AM, please  contact night-coverage [email or web address] Password [email or web address]

## 2018-10-07 ENCOUNTER — Other Ambulatory Visit: Payer: Self-pay | Admitting: Cardiology

## 2018-10-08 ENCOUNTER — Other Ambulatory Visit: Payer: Self-pay | Admitting: *Deleted

## 2018-10-08 MED ORDER — RALOXIFENE HCL 60 MG PO TABS: 60.0000 mg | ORAL_TABLET | Freq: Every day | ORAL | 0 refills | Status: DC

## 2018-10-13 ENCOUNTER — Other Ambulatory Visit: Payer: Self-pay

## 2018-10-14 ENCOUNTER — Encounter: Payer: Self-pay | Admitting: Family Medicine

## 2018-10-14 ENCOUNTER — Other Ambulatory Visit: Payer: Self-pay

## 2018-10-14 ENCOUNTER — Ambulatory Visit (INDEPENDENT_AMBULATORY_CARE_PROVIDER_SITE_OTHER): Payer: Medicare Other | Admitting: Family Medicine

## 2018-10-14 VITALS — BP 142/56 | HR 89 | Temp 98.4°F | Ht 64.0 in | Wt 162.6 lb

## 2018-10-14 DIAGNOSIS — I5032 Chronic diastolic (congestive) heart failure: Secondary | ICD-10-CM

## 2018-10-14 DIAGNOSIS — J449 Chronic obstructive pulmonary disease, unspecified: Secondary | ICD-10-CM

## 2018-10-14 DIAGNOSIS — Z09 Encounter for follow-up examination after completed treatment for conditions other than malignant neoplasm: Secondary | ICD-10-CM | POA: Diagnosis not present

## 2018-10-14 DIAGNOSIS — R7989 Other specified abnormal findings of blood chemistry: Secondary | ICD-10-CM

## 2018-10-14 NOTE — Patient Instructions (Signed)
DASH Eating Plan  DASH stands for "Dietary Approaches to Stop Hypertension." The DASH eating plan is a healthy eating plan that has been shown to reduce high blood pressure (hypertension). It may also reduce your risk for type 2 diabetes, heart disease, and stroke. The DASH eating plan may also help with weight loss.  What are tips for following this plan?    General guidelines   Avoid eating more than 2,300 mg (milligrams) of salt (sodium) a day. If you have hypertension, you may need to reduce your sodium intake to 1,500 mg a day.   Limit alcohol intake to no more than 1 drink a day for nonpregnant women and 2 drinks a day for men. One drink equals 12 oz of beer, 5 oz of wine, or 1 oz of hard liquor.   Work with your health care provider to maintain a healthy body weight or to lose weight. Ask what an ideal weight is for you.   Get at least 30 minutes of exercise that causes your heart to beat faster (aerobic exercise) most days of the week. Activities may include walking, swimming, or biking.   Work with your health care provider or diet and nutrition specialist (dietitian) to adjust your eating plan to your individual calorie needs.  Reading food labels     Check food labels for the amount of sodium per serving. Choose foods with less than 5 percent of the Daily Value of sodium. Generally, foods with less than 300 mg of sodium per serving fit into this eating plan.   To find whole grains, look for the word "whole" as the first word in the ingredient list.  Shopping   Buy products labeled as "low-sodium" or "no salt added."   Buy fresh foods. Avoid canned foods and premade or frozen meals.  Cooking   Avoid adding salt when cooking. Use salt-free seasonings or herbs instead of table salt or sea salt. Check with your health care provider or pharmacist before using salt substitutes.   Do not fry foods. Cook foods using healthy methods such as baking, boiling, grilling, and broiling instead.   Cook with  heart-healthy oils, such as olive, canola, soybean, or sunflower oil.  Meal planning   Eat a balanced diet that includes:  ? 5 or more servings of fruits and vegetables each day. At each meal, try to fill half of your plate with fruits and vegetables.  ? Up to 6-8 servings of whole grains each day.  ? Less than 6 oz of lean meat, poultry, or fish each day. A 3-oz serving of meat is about the same size as a deck of cards. One egg equals 1 oz.  ? 2 servings of low-fat dairy each day.  ? A serving of nuts, seeds, or beans 5 times each week.  ? Heart-healthy fats. Healthy fats called Omega-3 fatty acids are found in foods such as flaxseeds and coldwater fish, like sardines, salmon, and mackerel.   Limit how much you eat of the following:  ? Canned or prepackaged foods.  ? Food that is high in trans fat, such as fried foods.  ? Food that is high in saturated fat, such as fatty meat.  ? Sweets, desserts, sugary drinks, and other foods with added sugar.  ? Full-fat dairy products.   Do not salt foods before eating.   Try to eat at least 2 vegetarian meals each week.   Eat more home-cooked food and less restaurant, buffet, and fast food.     When eating at a restaurant, ask that your food be prepared with less salt or no salt, if possible.  What foods are recommended?  The items listed may not be a complete list. Talk with your dietitian about what dietary choices are best for you.  Grains  Whole-grain or whole-wheat bread. Whole-grain or whole-wheat pasta. Brown rice. Oatmeal. Quinoa. Bulgur. Whole-grain and low-sodium cereals. Pita bread. Low-fat, low-sodium crackers. Whole-wheat flour tortillas.  Vegetables  Fresh or frozen vegetables (raw, steamed, roasted, or grilled). Low-sodium or reduced-sodium tomato and vegetable juice. Low-sodium or reduced-sodium tomato sauce and tomato paste. Low-sodium or reduced-sodium canned vegetables.  Fruits  All fresh, dried, or frozen fruit. Canned fruit in natural juice (without  added sugar).  Meat and other protein foods  Skinless chicken or turkey. Ground chicken or turkey. Pork with fat trimmed off. Fish and seafood. Egg whites. Dried beans, peas, or lentils. Unsalted nuts, nut butters, and seeds. Unsalted canned beans. Lean cuts of beef with fat trimmed off. Low-sodium, lean deli meat.  Dairy  Low-fat (1%) or fat-free (skim) milk. Fat-free, low-fat, or reduced-fat cheeses. Nonfat, low-sodium ricotta or cottage cheese. Low-fat or nonfat yogurt. Low-fat, low-sodium cheese.  Fats and oils  Soft margarine without trans fats. Vegetable oil. Low-fat, reduced-fat, or light mayonnaise and salad dressings (reduced-sodium). Canola, safflower, olive, soybean, and sunflower oils. Avocado.  Seasoning and other foods  Herbs. Spices. Seasoning mixes without salt. Unsalted popcorn and pretzels. Fat-free sweets.  What foods are not recommended?  The items listed may not be a complete list. Talk with your dietitian about what dietary choices are best for you.  Grains  Baked goods made with fat, such as croissants, muffins, or some breads. Dry pasta or rice meal packs.  Vegetables  Creamed or fried vegetables. Vegetables in a cheese sauce. Regular canned vegetables (not low-sodium or reduced-sodium). Regular canned tomato sauce and paste (not low-sodium or reduced-sodium). Regular tomato and vegetable juice (not low-sodium or reduced-sodium). Pickles. Olives.  Fruits  Canned fruit in a light or heavy syrup. Fried fruit. Fruit in cream or butter sauce.  Meat and other protein foods  Fatty cuts of meat. Ribs. Fried meat. Bacon. Sausage. Bologna and other processed lunch meats. Salami. Fatback. Hotdogs. Bratwurst. Salted nuts and seeds. Canned beans with added salt. Canned or smoked fish. Whole eggs or egg yolks. Chicken or turkey with skin.  Dairy  Whole or 2% milk, cream, and half-and-half. Whole or full-fat cream cheese. Whole-fat or sweetened yogurt. Full-fat cheese. Nondairy creamers. Whipped toppings.  Processed cheese and cheese spreads.  Fats and oils  Butter. Stick margarine. Lard. Shortening. Ghee. Bacon fat. Tropical oils, such as coconut, palm kernel, or palm oil.  Seasoning and other foods  Salted popcorn and pretzels. Onion salt, garlic salt, seasoned salt, table salt, and sea salt. Worcestershire sauce. Tartar sauce. Barbecue sauce. Teriyaki sauce. Soy sauce, including reduced-sodium. Steak sauce. Canned and packaged gravies. Fish sauce. Oyster sauce. Cocktail sauce. Horseradish that you find on the shelf. Ketchup. Mustard. Meat flavorings and tenderizers. Bouillon cubes. Hot sauce and Tabasco sauce. Premade or packaged marinades. Premade or packaged taco seasonings. Relishes. Regular salad dressings.  Where to find more information:   National Heart, [employer], and [employer] Institute: www.nhlbi.nih.gov   American Heart Association: www.heart.org  Summary   The DASH eating plan is a healthy eating plan that has been shown to reduce high blood pressure (hypertension). It may also reduce your risk for type 2 diabetes, heart disease, and stroke.   With the   DASH eating plan, you should limit salt (sodium) intake to 2,300 mg a day. If you have hypertension, you may need to reduce your sodium intake to 1,500 mg a day.   When on the DASH eating plan, aim to eat more fresh fruits and vegetables, whole grains, lean proteins, low-fat dairy, and heart-healthy fats.   Work with your health care provider or diet and nutrition specialist (dietitian) to adjust your eating plan to your individual calorie needs.  This information is not intended to replace advice given to you by your health care provider. Make sure you discuss any questions you have with your health care provider.  Document Released: 04/05/2011 Document Revised: 04/09/2016 Document Reviewed: 04/09/2016  [employer] Interactive Patient Education  2019 [employer]

## 2018-10-14 NOTE — Progress Notes (Addendum)
Subjective:  Patient ID: [name], female    DOB: 1934-08-15, 84 y.o.   MRN: [id number]  Chief Complaint:  Hospitalization Follow-up ([facility]- COPD. Patient states she is still having some exertional SOB)   HPI: [name] is a 84 y.o. female presenting on 10/14/2018 for Hospitalization Follow-up ([facility]- COPD. Patient states she is still having some exertional SOB)  Pt presents today for hospital discharge follow up. Pt was admitted to [facility] on 10/04/2018 and discharged on 10/06/2018. Discharge diagnoses include: COPD vs asthma exacerbation, chronic diastolic heart failure, HTN, hyperlipidemia, prolonged QT interval, and hypophosphatemia. Pt states she has been doing well since discharge home. Pt states she using her oxygen every other day. Does not require on a daily basis. States she does have exertional shortness of breath, states this is normal for her. She denies chest pain, fatigue, PND, palpitations, or swelling. States she does prop on 2 pillows at night. No fever, chills, weakness, increased cough, or sputum production.   Relevant past medical, surgical, family, and social history reviewed and updated as indicated.  Allergies and medications reviewed and updated.   Past Medical History:  Diagnosis Date  . Allergic rhinitis   . Anxiety   . Arthritis   . Asthma   . CAP (community acquired pneumonia) 11/26/2017  . COPD (chronic obstructive pulmonary disease) ([place])   . Depression   . Dyspnea    with exertion   . Elevated troponin 04/05/2016  . Essential hypertension, benign   . GERD (gastroesophageal reflux disease)   . Headache    occiptal neuralgia - left   . Heart murmur   . History of blood transfusion    "related to miscarriage"  . Left bundle branch block    in & out of A fib  . Memory loss   . Mixed hyperlipidemia   . Occipital neuralgia of left side   . Osteopenia   . 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 Urinary urgency     Past Surgical History:  Procedure Laterality Date  . APPENDECTOMY    . BACK SURGERY    . CARDIAC CATHETERIZATION  2010   Normal coronaries  . CATARACT EXTRACTION W/PHACO  04/16/2011   Procedure: CATARACT EXTRACTION PHACO AND INTRAOCULAR LENS PLACEMENT (IOC);  Surgeon: Williams Che;  Location: [facility];  Service: Ophthalmology;  Laterality: Left;  CDE=11.35  . CATARACT EXTRACTION W/PHACO  01/28/2012   Procedure: CATARACT EXTRACTION PHACO AND INTRAOCULAR LENS PLACEMENT (IOC);  Surgeon: Williams Che, MD;  Location: [facility];  Service: Ophthalmology;  Laterality: Right;  CDI:8.15  . CYSTO WITH HYDRODISTENSION  05/29/2012   Procedure: CYSTOSCOPY/HYDRODISTENSION;  Surgeon: Reece Packer, MD;  Location: [facility];  Service: Urology;  Laterality: N/A;  INSTILLATION OF MARCAINE AND PYRIDIUM   . DILATION AND CURETTAGE OF UTERUS  <hysterectomy"  . EYE SURGERY    . HAMMER TOE SURGERY Bilateral 11/22/11   [place], [place] Sh[place]  . JOINT REPLACEMENT    . LAPAROSCOPIC CHOLECYSTECTOMY  1991  . LUMBAR LAMINECTOMY  2011  . LUMBAR LAMINECTOMY/DECOMPRESSION MICRODISCECTOMY Left 06/06/2012   Procedure: LUMBAR LAMINECTOMY/DECOMPRESSION MICRODISCECTOMY 1 LEVEL;  Surgeon: Erline Levine, MD;  Location: [place] [facility];  Service: Neurosurgery;  Laterality: Left;  Left Lumbar two-three Laminectomy for resection of synovial cyst  . SHOULDER ARTHROSCOPY Right 02/12/2018   SHOULDER ARTHROSCOPY, BICEPS DEBRIDEMENT, REMOVAL OF LOOSE BODIES  . SHOULDER ARTHROSCOPY Right 02/12/2018   Procedure: RIGHT SHOULDER ARTHROSCOPY, BICEPS  DEBRIDEMENT, REMOVAL OF LOOSE BODIES;  Surgeon: Marybelle Killings, MD;  Location: [place];  Service: Orthopedics;  Laterality: Right;  . SHOULDER OPEN ROTATOR CUFF REPAIR Right 2011  . TONSILLECTOMY    . TOTAL HIP ARTHROPLASTY Right 03/13/2013   Procedure: TOTAL HIP ARTHROPLASTY;  Surgeon: Kerin Salen, MD;  Location: [place];  Service: Orthopedics;  Laterality: Right;  . TOTAL SHOULDER  ARTHROPLASTY Right 07/02/2018   Procedure: RIGHT TOTAL SHOULDER ARTHROPLASTY;  Surgeon: Marybelle Killings, MD;  Location: [place];  Service: Orthopedics;  Laterality: Right;  Marland Kitchen VAGINAL HYSTERECTOMY      Social History   Socioeconomic History  . Marital status: Widowed    Spouse name: Not on file  . Number of children: 5  . Years of education: 8th grade  . Highest education level: Not on file  Occupational History  . Occupation: Worked in Holiday representative for 30 years    Employer: RETIRED    Comment: Retired  . Occupation: Part time care giver  Social Needs  . Financial resource strain: Somewhat hard  . Food insecurity    Worry: Sometimes true    Inability: Never true  . Transportation needs    Medical: No    Non-medical: No  Tobacco Use  . Smoking status: Former Smoker    Years: 60.00    Types: Cigarettes    Quit date: 09/12/2018    Years since quitting: 0.0  . Smokeless tobacco: Never Used  Substance and Sexual Activity  . Alcohol use: Not Currently    Comment: 11/27/2017 "drank some in my 20s"  . Drug use: No  . Sexual activity: Not Currently  Lifestyle  . Physical activity    Days per week: 0 days    Minutes per session: 0 min  . Stress: Only a little  Relationships  . Social connections    Talks on phone: More than three times a week    Gets together: More than three times a week    Attends religious service: More than 4 times per year    Active member of club or organization: Yes    Attends meetings of clubs or organizations: More than 4 times per year    Relationship status: Widowed  . Intimate partner violence    Fear of current or ex partner: No    Emotionally abused: No    Physically abused: No    Forced sexual activity: No  Other Topics Concern  . Not on file  Social History Narrative   Ms Bonello is divorced. Her adult son lives with her and he is disabled due to bilateral AKA due to diabetes.  She helps to provide care to him. She has three adult  daughters that live in the [place] area. She talks with them daily and they come to visit once or twice a year. She has 7 grandchildren also.     Outpatient Encounter Medications as of 10/14/2018  Medication Sig  . albuterol (PROVENTIL HFA;VENTOLIN HFA) 108 (90 Base) MCG/ACT inhaler Inhale 1-2 puffs into the lungs every 6 (six) hours as needed for wheezing or shortness of breath.  Marland Kitchen albuterol (PROVENTIL) (2.5 MG/3ML) 0.083% nebulizer solution Take 2.5 mg by nebulization every 6 (six) hours as needed for wheezing or shortness of breath.   Marland Kitchen aspirin EC 81 MG tablet Take 81 mg by mouth every evening.   . Biotin 5 MG TABS Take 5 mg by mouth every morning.   . budesonide-formoterol (SYMBICORT) 160-4.5 MCG/ACT inhaler  TAKE 2 PUFFS BY MOUTH TWICE A DAY (Patient taking differently: Inhale 2 puffs into the lungs 2 (two) times a day. )  . buPROPion (WELLBUTRIN SR) 150 MG 12 hr tablet TAKE (1) TABLET TWICE A DAY. (Patient taking differently: Take 150 mg by mouth 2 (two) times daily. )  . carvedilol (COREG) 6.25 MG tablet TAKE  (1)  TABLET TWICE A DAY WITH MEALS (BREAKFAST AND SUPPER) (Patient taking differently: Take 6.25 mg by mouth 2 (two) times daily with a meal. )  . furosemide (LASIX) 20 MG tablet TAKE 2 TABLETS DAILY FOR 2 DAYS, THEN DECREASE BACK TO 1 DAILY (Patient taking differently: Take 20 mg by mouth daily. )  . gabapentin (NEURONTIN) 100 MG capsule Take 1 capsule (100 mg total) by mouth 3 (three) times daily.  Marland Kitchen loratadine (CLARITIN) 10 MG tablet Take 1 tablet (10 mg total) by mouth daily.  Marland Kitchen losartan (COZAAR) 50 MG tablet TAKE (1/2) TABLET DAILY. (Patient taking differently: Take 25 mg by mouth daily. TAKE (1/2) TABLET DAILY.)  . memantine (NAMENDA) 10 MG tablet Take 1 tablet (10 mg total) by mouth 2 (two) times daily for 30 days.  . Multiple Vitamins-Minerals (CENTRUM SILVER PO) Take 1 tablet by mouth every morning.   Marland Kitchen omeprazole (PRILOSEC) 40 MG capsule Take 40 mg by mouth daily.  . ondansetron  (ZOFRAN) 4 MG tablet Take 1 tablet (4 mg total) by mouth every 8 (eight) hours as needed for nausea or vomiting.  . phentermine (ADIPEX-P) 37.5 MG tablet Take 37.5 mg by mouth daily before breakfast.  . potassium chloride SA (K-DUR) 20 MEQ tablet TAKE 1 TABLET DAILY  . pravastatin (PRAVACHOL) 80 MG tablet Take 1 tablet (80 mg total) by mouth daily.  . raloxifene (EVISTA) 60 MG tablet Take 1 tablet (60 mg total) by mouth daily.  Marland Kitchen Spacer/Aero-Holding Chambers (AEROCHAMBER MV) inhaler Use as instructed  . [DISCONTINUED] predniSONE (DELTASONE) 20 MG tablet Take 2 tablets (40 mg total) by mouth daily with breakfast.   No facility-administered encounter medications on file as of 10/14/2018.     Allergies  Allergen Reactions  . Lisinopril-Hydrochlorothiazide Swelling, Other (See Comments) and Cough    Lip swelling and cough. Stopped by ENT. Patient is on Losartan without any issues   . Codeine Itching and Swelling    eye irritation (prescribed percocet)  . Penicillins Itching and Swelling    PATIENT HAS HAD A PCN REACTION WITH IMMEDIATE RASH, FACIAL/TONGUE/THROAT SWELLING, SOB, OR LIGHTHEADEDNESS WITH HYPOTENSION:  #  #  YES  #  #  Has patient had a PCN reaction causing severe rash involving mucus membranes or skin necrosis: No Has patient had a PCN reaction that required hospitalization No Has patient had a PCN reaction occurring within the last 10 years: No eye irritation  . Aricept [Donepezil Hcl] Nausea And Vomiting  . Namenda [Memantine Hcl] Nausea And Vomiting    Review of Systems  Constitutional: Negative for chills, fatigue and fever.  Eyes: Negative for photophobia and visual disturbance.  Respiratory: Positive for cough and shortness of breath (exertional). Negative for chest tightness and wheezing.   Cardiovascular: Negative for chest pain, palpitations and leg swelling.  Gastrointestinal: Negative for abdominal distention and abdominal pain.  Genitourinary: Negative for  decreased urine volume and difficulty urinating.  Musculoskeletal: Negative for joint swelling.  Neurological: Negative for dizziness, tremors, seizures, syncope, facial asymmetry, speech difficulty, weakness, light-headedness, numbness and headaches.  Psychiatric/Behavioral: Positive for confusion (baseline). Negative for sleep disturbance.  All other systems  reviewed and are negative.       Objective:  BP (!) 142/56   Pulse 89   Temp 98.4 F (36.9 C) (Oral)   Ht _0  (1.626 m)   Wt 162 lb 9.6 oz (73.8 kg)   SpO2 95%   BMI 27.91 kg/m    Wt Readings from Last 3 Encounters:  10/14/18 162 lb 9.6 oz (73.8 kg)  10/05/18 158 lb 8.2 oz (71.9 kg)  09/30/18 160 lb 6.4 oz (72.8 kg)    Physical Exam Vitals signs and nursing note reviewed.  Constitutional:      General: She is not in acute distress.    Appearance: Normal appearance. She is well-developed and well-groomed. She is not ill-appearing, toxic-appearing or diaphoretic.  HENT:     Head: Normocephalic and atraumatic.     Jaw: There is normal jaw occlusion.     Right Ear: Hearing normal.     Left Ear: Hearing normal.     Nose: Nose normal.     Mouth/Throat:     Lips: Pink.     Mouth: Mucous membranes are moist.     Pharynx: Oropharynx is clear. Uvula midline.  Eyes:     General: Lids are normal.     Extraocular Movements: Extraocular movements intact.     Conjunctiva/sclera: Conjunctivae normal.     Pupils: Pupils are equal, round, and reactive to light.  Neck:     Musculoskeletal: Normal range of motion and neck supple.     Thyroid: No thyroid mass, thyromegaly or thyroid tenderness.     Vascular: No carotid bruit or JVD.     Trachea: Trachea and phonation normal.  Cardiovascular:     Rate and Rhythm: Normal rate and regular rhythm.     Chest Wall: PMI is not displaced.     Pulses: Normal pulses.     Heart sounds: Normal heart sounds. No murmur. No friction rub. No gallop.   Pulmonary:     Effort: Pulmonary  effort is normal. No respiratory distress.     Breath sounds: No wheezing or rhonchi.  Abdominal:     General: Bowel sounds are normal. There is no distension or abdominal bruit.     Palpations: Abdomen is soft. There is no hepatomegaly or splenomegaly.     Tenderness: There is no abdominal tenderness. There is no right CVA tenderness or left CVA tenderness.     Hernia: No hernia is present.  Musculoskeletal: Normal range of motion.     Right lower leg: No edema.     Left lower leg: No edema.  Lymphadenopathy:     Cervical: No cervical adenopathy.  Skin:    General: Skin is warm and dry.     Capillary Refill: Capillary refill takes less than 2 seconds.     Coloration: Skin is not cyanotic, jaundiced or pale.     Findings: No rash.  Neurological:     General: No focal deficit present.     Mental Status: She is alert. Mental status is at baseline.     Cranial Nerves: Cranial nerves are intact.     Sensory: Sensation is intact.     Motor: Motor function is intact. No weakness.     Coordination: Coordination is intact.     Gait: Gait is intact.     Deep Tendon Reflexes: Reflexes are normal and symmetric.  Psychiatric:        Attention and Perception: Attention and perception normal.  Mood and Affect: Mood and affect normal.        Speech: Speech normal.        Behavior: Behavior normal. Behavior is cooperative.        Thought Content: Thought content normal.        Cognition and Memory: Cognition and memory normal.        Judgment: Judgment normal.     Results for orders placed or performed during the hospital encounter of 10/04/18  SARS Coronavirus 2 (CEPHEID- Performed in [place] hospital lab), [facility] Order   Specimen: Nasopharyngeal Swab  Result Value Ref Range   SARS Coronavirus 2 NEGATIVE NEGATIVE  Basic metabolic panel  Result Value Ref Range   Sodium 144 135 - 145 mmol/L   Potassium 3.6 3.5 - 5.1 mmol/L   Chloride 110 98 - 111 mmol/L   CO2 25 22 - 32 mmol/L    Glucose, Bld 147 (H) 70 - 99 mg/dL   BUN 22 8 - 23 mg/dL   Creatinine, Ser 1.07 (H) 0.44 - 1.00 mg/dL   Calcium 9.2 8.9 - 10.3 mg/dL   GFR calc non Af Amer 49 (L) >60 mL/min   GFR calc Af Amer 56 (L) >60 mL/min   Anion gap 9 5 - 15  CBC with Differential  Result Value Ref Range   WBC 8.1 4.0 - 10.5 K/uL   RBC 4.61 3.87 - 5.11 MIL/uL   Hemoglobin 13.9 12.0 - 15.0 g/dL   HCT 42.6 36.0 - 46.0 %   MCV 92.4 80.0 - 100.0 fL   MCH 30.2 26.0 - 34.0 pg   MCHC 32.6 30.0 - 36.0 g/dL   RDW 14.8 11.5 - 15.5 %   Platelets 258 150 - 400 K/uL   nRBC 0.0 0.0 - 0.2 %   Neutrophils Relative % 78 %   Neutro Abs 6.3 1.7 - 7.7 K/uL   Lymphocytes Relative 13 %   Lymphs Abs 1.0 0.7 - 4.0 K/uL   Monocytes Relative 3 %   Monocytes Absolute 0.3 0.1 - 1.0 K/uL   Eosinophils Relative 5 %   Eosinophils Absolute 0.4 0.0 - 0.5 K/uL   Basophils Relative 1 %   Basophils Absolute 0.0 0.0 - 0.1 K/uL   Immature Granulocytes 0 %   Abs Immature Granulocytes 0.02 0.00 - 0.07 K/uL  Troponin I - Once  Result Value Ref Range   Troponin I 0.07 (HH) <0.03 ng/mL  Brain natriuretic peptide  Result Value Ref Range   B Natriuretic Peptide 100.7 (H) 0.0 - 100.0 pg/mL  Blood gas, venous  Result Value Ref Range   FIO2 21.00    Delivery systems ROOM AIR    pH, Ven 7.350 7.250 - 7.430   pCO2, Ven 49.5 44.0 - 60.0 mmHg   pO2, Ven 51.7 (H) 32.0 - 45.0 mmHg   Bicarbonate 26.6 20.0 - 28.0 mmol/L   Acid-Base Excess 0.8 0.0 - 2.0 mmol/L   O2 Saturation 82.3 %   Patient temperature 98.6    Collection site VEIN    Drawn by COLLECTED BY NURSE    Sample type VENOUS   Troponin I - Now Then Q6H  Result Value Ref Range   Troponin I 0.07 (HH) <0.03 ng/mL  Troponin I - Now Then Q6H  Result Value Ref Range   Troponin I 0.08 (HH) <0.03 ng/mL  D-dimer, quantitative (not at [facility])  Result Value Ref Range   D-Dimer, Quant 1.74 (H) 0.00 - 0.50 ug/mL-FEU  Magnesium  Result  Value Ref Range   Magnesium 3.0 (H) 1.7 - 2.4 mg/dL   Magnesium  Result Value Ref Range   Magnesium 2.5 (H) 1.7 - 2.4 mg/dL  Phosphorus  Result Value Ref Range   Phosphorus 1.5 (L) 2.5 - 4.6 mg/dL  TSH  Result Value Ref Range   TSH 0.325 (L) 0.350 - 4.500 uIU/mL  Comprehensive metabolic panel  Result Value Ref Range   Sodium 142 135 - 145 mmol/L   Potassium 4.5 3.5 - 5.1 mmol/L   Chloride 113 (H) 98 - 111 mmol/L   CO2 19 (L) 22 - 32 mmol/L   Glucose, Bld 150 (H) 70 - 99 mg/dL   BUN 21 8 - 23 mg/dL   Creatinine, Ser 0.99 0.44 - 1.00 mg/dL   Calcium 8.8 (L) 8.9 - 10.3 mg/dL   Total Protein 6.7 6.5 - 8.1 g/dL   Albumin 3.6 3.5 - 5.0 g/dL   AST 33 15 - 41 U/L   ALT 26 0 - 44 U/L   Alkaline Phosphatase 92 38 - 126 U/L   Total Bilirubin <0.1 (L) 0.3 - 1.2 mg/dL   GFR calc non Af Amer 53 (L) >60 mL/min   GFR calc Af Amer >60 >60 mL/min   Anion gap 10 5 - 15  CBC  Result Value Ref Range   WBC 5.2 4.0 - 10.5 K/uL   RBC 4.47 3.87 - 5.11 MIL/uL   Hemoglobin 13.5 12.0 - 15.0 g/dL   HCT 41.8 36.0 - 46.0 %   MCV 93.5 80.0 - 100.0 fL   MCH 30.2 26.0 - 34.0 pg   MCHC 32.3 30.0 - 36.0 g/dL   RDW 14.8 11.5 - 15.5 %   Platelets 259 150 - 400 K/uL   nRBC 0.0 0.0 - 0.2 %  Phosphorus  Result Value Ref Range   Phosphorus 2.4 (L) 2.5 - 4.6 mg/dL  Glucose, capillary  Result Value Ref Range   Glucose-Capillary 70 70 - 99 mg/dL       Pertinent labs & imaging results that were available during my care of the patient were reviewed by me and considered in my medical decision making.  Assessment & Plan:  Yazleen was seen today for hospitalization follow-up.  Diagnoses and all orders for this visit:  Hospital discharge follow-up Pt states she is feeling well since discharge from the hospital. States she uses her oxygen every other day or so. States it is not needed on a daily basis. No concerns since her discharge.   Hypophosphatemia Phosphorous 2.4 during admission, will recheck today.  -     CMP14+EGFR -     Phosphorus  Hypermagnesemia  Magnesium 2.5 in hospital, will recheck today.  -     Magnesium -     [id number]+APOL  Chronic diastolic heart failure ([place]) Doing well. Denies chest pain or excessive fatigue. Does have slight exertional dyspnea and right ankle swelling. BNP was slightly elevated in hospital, will recheck today.  -     Brain natriuretic peptide  GOLD COPD 0 with Asthmatic Component  Doing well on current regimen. Was admitted 06/06 - 10/06/2018 for COPD exacerbation. Has been doing well since coming home. No need for increase of oxygen or daily use of oxygen.   Elevated TSH -     Thyroid Panel With TSH     Continue all other maintenance medications.  Follow up plan: Return in about 3 months (around 01/14/2019), or if symptoms worsen or fail to improve, for HTN, Dementia, COPD.  Educational handout given for DASH diet  The above assessment and management plan was discussed with the patient. The patient verbalized understanding of and has agreed to the management plan. Patient is aware to call the clinic if symptoms persist or worsen. Patient is aware when to return to the clinic for a follow-up visit. Patient educated on when it is appropriate to go to the emergency department.   Monia Pouch, FNP-C [facility] [phone]

## 2018-10-15 LAB — CMP14+EGFR
ALT: 19 IU/L (ref 0–32)
AST: 24 IU/L (ref 0–40)
Albumin/Globulin Ratio: 2.2 (ref 1.2–2.2)
Albumin: 4 g/dL (ref 3.6–4.6)
Alkaline Phosphatase: 98 IU/L (ref 39–117)
BUN/Creatinine Ratio: 18 (ref 12–28)
BUN: 20 mg/dL (ref 8–27)
Bilirubin Total: <0.2 mg/dL (ref 0.0–1.2)
CO2: 22 mmol/L (ref 20–29)
Calcium: 8.9 mg/dL (ref 8.7–10.3)
Chloride: 108 mmol/L — ABNORMAL HIGH (ref 96–106)
Creatinine, Ser: 1.09 mg/dL — ABNORMAL HIGH (ref 0.57–1.00)
GFR calc Af Amer: 55 mL/min/{1.73_m2} — ABNORMAL LOW (ref >59)
GFR calc non Af Amer: 48 mL/min/{1.73_m2} — ABNORMAL LOW (ref >59)
Globulin, Total: 1.8 g/dL (ref 1.5–4.5)
Glucose: 98 mg/dL (ref 65–99)
Potassium: 3.9 mmol/L (ref 3.5–5.2)
Sodium: 145 mmol/L — ABNORMAL HIGH (ref 134–144)
Total Protein: 5.8 g/dL — ABNORMAL LOW (ref 6.0–8.5)

## 2018-10-15 LAB — THYROID PANEL WITH TSH
Free Thyroxine Index: 1.8 (ref 1.2–4.9)
T3 Uptake Ratio: 27 % (ref 24–39)
T4, Total: 6.7 ug/dL (ref 4.5–12.0)
TSH: 0.832 u[IU]/mL (ref 0.450–4.500)

## 2018-10-15 LAB — PHOSPHORUS: Phosphorus: 2.7 mg/dL — ABNORMAL LOW (ref 3.0–4.3)

## 2018-10-15 LAB — MAGNESIUM: Magnesium: 2.1 mg/dL (ref 1.6–2.3)

## 2018-10-15 LAB — BRAIN NATRIURETIC PEPTIDE: BNP: 168.7 pg/mL — ABNORMAL HIGH (ref 0.0–100.0)

## 2018-10-17 ENCOUNTER — Telehealth: Payer: Self-pay | Admitting: *Deleted

## 2018-10-17 NOTE — Telephone Encounter (Signed)
I she continues to get [facility] and have weakness, she needs to be reevaluated. If this becomes severe, she needs to return to the ED.

## 2018-10-17 NOTE — Telephone Encounter (Signed)
VM from [place] w/ [facility] Pt was a bit more lethargic today & SOB BP 140/90 HR 60 regular Had taken her nebulizer once today, was encouraged to take as prescribed 'Q 6 hrs prn' and to use her oxygen more as needed Please advise if there are any other recommendations or a televisit needed

## 2018-10-17 NOTE — Telephone Encounter (Signed)
Spoke with [place] nurse and advised of provider feedback. She states she will call pt and tell her and also cont to make sure pt is doing her breathing treatments.

## 2018-10-23 ENCOUNTER — Other Ambulatory Visit: Payer: Self-pay | Admitting: *Deleted

## 2018-10-23 DIAGNOSIS — F339 Major depressive disorder, recurrent, unspecified: Secondary | ICD-10-CM

## 2018-10-23 MED ORDER — BUPROPION HCL ER (SR) 150 MG PO TB12: ORAL_TABLET | ORAL | 1 refills | Status: AC

## 2018-10-27 ENCOUNTER — Telehealth: Payer: Self-pay | Admitting: Family Medicine

## 2018-10-28 ENCOUNTER — Other Ambulatory Visit: Payer: Self-pay | Admitting: Family Medicine

## 2018-10-28 ENCOUNTER — Other Ambulatory Visit: Payer: Self-pay | Admitting: *Deleted

## 2018-10-28 DIAGNOSIS — I429 Cardiomyopathy, unspecified: Secondary | ICD-10-CM

## 2018-10-28 DIAGNOSIS — R0902 Hypoxemia: Secondary | ICD-10-CM

## 2018-10-28 DIAGNOSIS — J449 Chronic obstructive pulmonary disease, unspecified: Secondary | ICD-10-CM

## 2018-10-28 DIAGNOSIS — F03A0 Unspecified dementia, mild, without behavioral disturbance, psychotic disturbance, mood disturbance, and anxiety: Secondary | ICD-10-CM

## 2018-10-28 DIAGNOSIS — M5481 Occipital neuralgia: Secondary | ICD-10-CM

## 2018-10-28 DIAGNOSIS — F0390 Unspecified dementia without behavioral disturbance: Secondary | ICD-10-CM

## 2018-10-28 DIAGNOSIS — I5032 Chronic diastolic (congestive) heart failure: Secondary | ICD-10-CM

## 2018-10-28 DIAGNOSIS — R42 Dizziness and giddiness: Secondary | ICD-10-CM

## 2018-10-28 DIAGNOSIS — I10 Essential (primary) hypertension: Secondary | ICD-10-CM

## 2018-10-28 DIAGNOSIS — E782 Mixed hyperlipidemia: Secondary | ICD-10-CM

## 2018-10-28 DIAGNOSIS — F339 Major depressive disorder, recurrent, unspecified: Secondary | ICD-10-CM

## 2018-10-28 MED ORDER — BUDESONIDE-FORMOTEROL FUMARATE 160-4.5 MCG/ACT IN AERO: INHALATION_SPRAY | RESPIRATORY_TRACT | 3 refills | Status: DC

## 2018-10-28 NOTE — Telephone Encounter (Signed)
Called and spoke with pts daughter after getting permission from pt.

## 2018-10-28 NOTE — Telephone Encounter (Signed)
Completed.

## 2018-10-29 ENCOUNTER — Other Ambulatory Visit: Payer: Self-pay

## 2018-10-30 ENCOUNTER — Ambulatory Visit: Payer: Medicare Other | Admitting: Family Medicine

## 2018-10-30 ENCOUNTER — Telehealth: Payer: Self-pay | Admitting: Family Medicine

## 2018-10-30 DIAGNOSIS — I5032 Chronic diastolic (congestive) heart failure: Secondary | ICD-10-CM

## 2018-10-30 DIAGNOSIS — J45909 Unspecified asthma, uncomplicated: Secondary | ICD-10-CM

## 2018-10-30 DIAGNOSIS — J449 Chronic obstructive pulmonary disease, unspecified: Secondary | ICD-10-CM

## 2018-10-30 DIAGNOSIS — R0602 Shortness of breath: Secondary | ICD-10-CM

## 2018-10-30 NOTE — Telephone Encounter (Signed)
Daughter called  - aware of company [place] Also DME order for pulse OX sent to [employer].

## 2018-11-03 ENCOUNTER — Ambulatory Visit: Payer: Self-pay | Admitting: Licensed Clinical Social Worker

## 2018-11-03 ENCOUNTER — Other Ambulatory Visit: Payer: Self-pay

## 2018-11-03 DIAGNOSIS — F339 Major depressive disorder, recurrent, unspecified: Secondary | ICD-10-CM

## 2018-11-03 DIAGNOSIS — J449 Chronic obstructive pulmonary disease, unspecified: Secondary | ICD-10-CM

## 2018-11-03 DIAGNOSIS — F411 Generalized anxiety disorder: Secondary | ICD-10-CM

## 2018-11-03 DIAGNOSIS — F03A0 Unspecified dementia, mild, without behavioral disturbance, psychotic disturbance, mood disturbance, and anxiety: Secondary | ICD-10-CM

## 2018-11-03 DIAGNOSIS — F0390 Unspecified dementia without behavioral disturbance: Secondary | ICD-10-CM

## 2018-11-03 DIAGNOSIS — R0602 Shortness of breath: Secondary | ICD-10-CM

## 2018-11-03 DIAGNOSIS — I10 Essential (primary) hypertension: Secondary | ICD-10-CM

## 2018-11-03 NOTE — Chronic Care Management (AMB) (Signed)
  Care Management Note   [name] is a 84 y.o. year old female who is a primary care patient of [name], Connye Burkitt, FNP. The CM team was consulted for assistance with chronic disease management and care coordination.   I reached out to [name] by phone today.   [name] was given information about Chronic Care Management services today including:  1. CCM service includes personalized support from designated clinical staff supervised by her physician, including individualized plan of care and coordination with other care providers 2. 24/7 contact phone numbers for assistance for urgent and routine care needs. 3. Service will only be billed when office clinical staff spend 20 minutes or more in a month to coordinate care. 4. Only one practitioner may furnish and bill the service in a calendar month. 5. The patient may stop CCM services at any time (effective at the end of the month) by phone call to the office staff. 6. The patient will be responsible for cost sharing (co-pay) of up to 20% of the service fee (after annual deductible is met). Patient did not agree to services and wishes to consider information provided before deciding about enrollment in care management services.    Review of patient status, including review of consultants reports, relevant laboratory and other test results, and collaboration with appropriate care team members and the patient's provider was performed as part of comprehensive patient evaluation and provision of chronic care management services.   Social Determinants of Health:Risk of tobacco use; risk for financial strain; risk for social isolation occasionally ; risk for food insecurity  [name] spoke of her 2 daughters who live in the [place] area.  She said she is mostly independent but occasionally could use some assistance. LCSW talked with [name] about CCM program services in nursing and social work.  LCSW talked with [name] about ways nursing  support with CCM program could be helpful to her. LCSW talked with her about ways social work support through [employer] could be helpful to her. She said she would think about program and she wants to talk with her daughters about CCM program support. She agreed for LCSW to call her in next 2 weeks to talk further about CCM program support  Follow Up Plan: LCSW to call client in next 2 weeks to talk further with client about CCM program services available  [name].Kanyon Seibold MSW, LCSW Licensed Clinical Social Worker [facility] [phone]

## 2018-11-03 NOTE — Patient Instructions (Addendum)
Licensed Clinical Social Worker Visit Information  Materials Provided: No  Ms. Quinonez was given information about Chronic Care Management services today including:  1. CCM service includes personalized support from designated clinical staff supervised by her physician, including individualized plan of care and coordination with other care providers 2. 24/7 contact phone numbers for assistance for urgent and routine care needs. 3. Service will only be billed when office clinical staff spend 20 minutes or more in a month to coordinate care. 4. Only one practitioner may furnish and bill the service in a calendar month. 5. The patient may stop CCM services at any time (effective at the end of the month) by phone call to the office staff. 6. The patient will be responsible for cost sharing (co-pay) of up to 20% of the service fee (after annual deductible is met).  Patient did not agree to services and wishes to consider information provided before deciding about enrollment in care management services.   Tanayah Squitieri spoke of her 2 daughters who live in the [place] area.  She said she is mostly independent but occasionally could use some assistance. LCSW talked with [name] about CCM program services in nursing and social work.  LCSW talked with [name] about ways nursing support with CCM program could be helpful to her. LCSW talked with her about ways social work support through [employer] could be helpful to her. She said she would think about program and she wants to talk with her daughters about CCM program support. She agreed for LCSW to call her in next 2 weeks to talk further about CCM program support  Follow Up Plan: LCSW to call client in next 2 weeks to talk further with client about  CCM program services available  The patient verbalized understanding of instructions provided today and declined a print copy of patient instruction materials.   [name].Prisha Hiley MSW, LCSW Licensed Clinical Social  Worker [facility] [phone]

## 2018-11-04 ENCOUNTER — Ambulatory Visit (INDEPENDENT_AMBULATORY_CARE_PROVIDER_SITE_OTHER): Payer: Medicare Other | Admitting: Family Medicine

## 2018-11-04 ENCOUNTER — Encounter: Payer: Self-pay | Admitting: Family Medicine

## 2018-11-04 ENCOUNTER — Telehealth: Payer: Self-pay | Admitting: *Deleted

## 2018-11-04 ENCOUNTER — Other Ambulatory Visit: Payer: Self-pay | Admitting: Cardiology

## 2018-11-04 VITALS — BP 128/70 | HR 56 | Temp 97.7°F | Ht 64.0 in | Wt 158.6 lb

## 2018-11-04 DIAGNOSIS — J449 Chronic obstructive pulmonary disease, unspecified: Secondary | ICD-10-CM

## 2018-11-04 DIAGNOSIS — I10 Essential (primary) hypertension: Secondary | ICD-10-CM | POA: Diagnosis not present

## 2018-11-04 DIAGNOSIS — I5032 Chronic diastolic (congestive) heart failure: Secondary | ICD-10-CM | POA: Diagnosis not present

## 2018-11-04 MED ORDER — TRELEGY ELLIPTA 100-62.5-25 MCG/INH IN AEPB: 1.0000 | INHALATION_SPRAY | Freq: Every day | RESPIRATORY_TRACT | 3 refills | Status: DC

## 2018-11-04 MED ORDER — ALBUTEROL SULFATE HFA 108 (90 BASE) MCG/ACT IN AERS: 1.0000 | INHALATION_SPRAY | Freq: Four times a day (QID) | RESPIRATORY_TRACT | 11 refills | Status: DC | PRN

## 2018-11-04 NOTE — Patient Instructions (Signed)
Fluticasone; Umeclidinium; Vilanterol inhalation powder What is this medicine? FLUTICASONE; UMECLIDINIUM; VILANTEROL (floo TIK a sone; ue MEK li DIN ee um; vye LAN ter ol) inhalation is a combination of 3 medicines that decrease inflammation and help to open up the airways of your lungs. It is for chronic obstructive pulmonary disease (COPD), including chronic bronchitis or emphysema. Do NOT use for asthma or an acute asthma attack. Do NOT use for a COPD attack. This medicine may be used for other purposes; ask your health care provider or pharmacist if you have questions. COMMON BRAND NAME(S): TRELEGY ELLIPTA What should I tell my health care provider before I take this medicine? They need to know if you have any of these conditions:  bone problems  diabetes  eye disease, vision problems  heart disease  high blood pressure  history of irregular heartbeat  immune system problems  infection  kidney disease  pheochromocytoma  prostate disease  seizures  thyroid disease  trouble passing urine  an unusual or allergic reaction to fluticasone, umeclidinium, vilanterol, lactose, milk proteins, other medicines, foods, dyes, or preservatives  pregnant or trying to get pregnant  breast-feeding How should I use this medicine? This medicine is inhaled through the mouth. It is used once per day. Follow the directions on the prescription label. Do not use a spacer device with this inhaler. Take your medicine at regular intervals. Do not take your medicine more often than directed. Do not stop taking except on your doctor's advice. Make sure that you are using your inhaler correctly. Ask you doctor or health care provider if you have any questions. A special MedGuide will be given to you by the pharmacist with each prescription and refill. Be sure to read this information carefully each time. Talk to your pediatrician regarding the use of this medicine in children. Special care may be  needed. Overdosage: If you think you have taken too much of this medicine contact a poison control center or emergency room at once. NOTE: This medicine is only for you. Do not share this medicine with others. What if I miss a dose? If you miss a dose, use it as soon as you can. Do not take more than 1 inhalation per day. If it is almost time for your next dose, use only that dose and continue with your regular schedule. Do not use double or extra doses. What may interact with this medicine? Do not take this medicine with any of the following medications:  cisapride  dofetilide  dronedarone  MAOIs like Carbex, Eldepryl, Marplan, Nardil, and Parnate  pimozide  thioridazine  ziprasidone This medicine may also interact with the following medications:  aclidinium  antihistamines for allergy  antiviral medicines for HIV or AIDS  atropine  beta-blockers like metoprolol and propranolol  certain antibiotics like clarithromycin and telithromycin  certain medicines for bladder problems like oxybutynin, tolterodine  certain medicines for depression, anxiety, or psychotic disturbances  certain medicines for fungal infections like ketoconazole, itraconazole, posaconazole, voriconazole  certain medicines for Parkinson's disease like benztropine, trihexyphenidyl  certain medicines for stomach problems like dicyclomine, hyoscyamine  certain medicines for travel sickness like scopolamine  conivaptan  diuretics  ipratropium  medicines for colds  other medicines for breathing problems  other medicines that prolong the QT interval (cause an abnormal heart rhythm)  nefazodone  tiotropium This list may not describe all possible interactions. Give your health care provider a list of all the medicines, herbs, non-prescription drugs, or dietary supplements you use. Also  tell them if you smoke, drink alcohol, or use illegal drugs. Some items may interact with your medicine. What  should I watch for while using this medicine? Visit your doctor or health care professional for regular checkups. Tell your doctor or health care professional if your symptoms do not get better. Do not use this medicine more than once every 24 hours. NEVER use this medicine for an acute COPD attack. You should use your short-acting rescue inhalers for this purpose. If your symptoms get worse or if you need your short-acting inhalers more often, call your doctor right away. If you are going to have surgery tell your doctor or health care professional that you are using this medicine. Try not to come in contact with people with the chicken pox or measles. If you do, call your doctor. This medicine may increase blood sugar. Ask your healthcare provider if changes in diet or medicines are needed if you have diabetes. What side effects may I notice from receiving this medicine? Side effects that you should report to your doctor or health care professional as soon as possible:  allergic reactions like skin rash or hives, swelling of the face, lips, or tongue  breathing problems right after inhaling your medicine  chest pain  eye pain  fast, irregular heartbeat  feeling faint or lightheaded, falls  fever or chills  nausea, vomiting  signs and symptoms of high blood sugar such as being more thirsty or hungry or having to urinate more than normal. You may also feel very tired or have blurry vision.  trouble passing urine Side effects that usually do not require medical attention (report these to your doctor or health care professional if they continue or are bothersome):  back pain  changes in taste  cough  diarrhea  headache  nervousness  sore throat  tremor This list may not describe all possible side effects. Call your doctor for medical advice about side effects. You may report side effects to FDA at 1-800-FDA-1088. Where should I keep my medicine? Keep out of the reach of  children. Store at room temperature between 15 and 30 degrees C (59 and 86 degrees F). Store in a dry place away from direct heat or sunlight. Throw away 6 weeks after you remove the inhaler from the foil tray, or after the dose indicator reads 0, whichever comes first. Throw away any unopened packages after the expiration date. NOTE: This sheet is a summary. It may not cover all possible information. If you have questions about this medicine, talk to your doctor, pharmacist, or health care provider.  2020 [employer] Standard (2018-01-15 11:22:15)

## 2018-11-04 NOTE — Progress Notes (Signed)
Subjective:  Patient ID: [name], female    DOB: 1934-11-02, 84 y.o.   MRN: [id number]  Chief Complaint:  Hypertension, Shortness of Breath (4-5 days ), and Medical Management of Chronic Issues   HPI: [name] is a 84 y.o. female presenting on 11/04/2018 for Hypertension, Shortness of Breath (4-5 days ), and Medical Management of Chronic Issues   1. Essential hypertension  Complaint with meds - Yes Checking BP at home - No Exercising Regularly - No Watching Salt intake - No Pertinent ROS:  Headache - No Chest pain - No Dyspnea - Yes Palpitations - No LE edema - No They report good compliance with medications and can restate their regimen by memory. No medication side effects.  BP Readings from Last 3 Encounters:  11/04/18 128/70  10/14/18 (!) 142/56  10/06/18 (!) 150/80     2. Chronic obstructive pulmonary disease, unspecified COPD type (HCC)  Increased exertional shortness of breath. No increase in sputum production or cough. States she has the shortness of breath daily. States she is taking her medications and using her inhaler as prescribed. States she still has the exertional shortness of breath. Pt denies fever, chills., fatigue, weakness, syncope, chest pain, or palpitations.    3. Chronic diastolic heart failure (HCC)  Denies PDN, orthopnea, chest pain, leg swelling, weight gain, fatigue or syncope. She does have exertional shortness of breath.      Relevant past medical, surgical, family, and social history reviewed and updated as indicated.  Allergies and medications reviewed and updated.   Past Medical History:  Diagnosis Date  . Allergic rhinitis   . Anxiety   . Arthritis   . Asthma   . CAP (community acquired pneumonia) 11/26/2017  . COPD (chronic obstructive pulmonary disease) ([place])   . Depression   . Dyspnea    with exertion   . Elevated troponin 04/05/2016  . Essential hypertension, benign   . GERD (gastroesophageal reflux  disease)   . Headache    occiptal neuralgia - left   . Heart murmur   . History of blood transfusion    "related to miscarriage"  . Left bundle branch block    in & out of A fib  . Memory loss   . Mixed hyperlipidemia   . Occipital neuralgia of left side   . Osteopenia   . 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 Urinary urgency     Past Surgical History:  Procedure Laterality Date  . APPENDECTOMY    . BACK SURGERY    . CARDIAC CATHETERIZATION  2010   Normal coronaries  . CATARACT EXTRACTION W/PHACO  04/16/2011   Procedure: CATARACT EXTRACTION PHACO AND INTRAOCULAR LENS PLACEMENT (IOC);  Surgeon: Williams Che;  Location: [facility];  Service: Ophthalmology;  Laterality: Left;  CDE=11.35  . CATARACT EXTRACTION W/PHACO  01/28/2012   Procedure: CATARACT EXTRACTION PHACO AND INTRAOCULAR LENS PLACEMENT (IOC);  Surgeon: Williams Che, MD;  Location: [facility];  Service: Ophthalmology;  Laterality: Right;  CDI:8.15  . CYSTO WITH HYDRODISTENSION  05/29/2012   Procedure: CYSTOSCOPY/HYDRODISTENSION;  Surgeon: Reece Packer, MD;  Location: [facility];  Service: Urology;  Laterality: N/A;  INSTILLATION OF MARCAINE AND PYRIDIUM   . DILATION AND CURETTAGE OF UTERUS  <hysterectomy"  . EYE SURGERY    . HAMMER TOE SURGERY Bilateral 11/22/11   [place], [place]  . JOINT REPLACEMENT    . LAPAROSCOPIC CHOLECYSTECTOMY  1991  .  LUMBAR LAMINECTOMY  2011  . LUMBAR LAMINECTOMY/DECOMPRESSION MICRODISCECTOMY Left 06/06/2012   Procedure: LUMBAR LAMINECTOMY/DECOMPRESSION MICRODISCECTOMY 1 LEVEL;  Surgeon: Erline Levine, MD;  Location: [facility];  Service: Neurosurgery;  Laterality: Left;  Left Lumbar two-three Laminectomy for resection of synovial cyst  . SHOULDER ARTHROSCOPY Right 02/12/2018   SHOULDER ARTHROSCOPY, BICEPS DEBRIDEMENT, REMOVAL OF LOOSE BODIES  . SHOULDER ARTHROSCOPY Right 02/12/2018   Procedure: RIGHT SHOULDER ARTHROSCOPY, BICEPS DEBRIDEMENT,  REMOVAL OF LOOSE BODIES;  Surgeon: Marybelle Killings, MD;  Location: [facility];  Service: Orthopedics;  Laterality: Right;  . SHOULDER OPEN ROTATOR CUFF REPAIR Right 2011  . TONSILLECTOMY    . TOTAL HIP ARTHROPLASTY Right 03/13/2013   Procedure: TOTAL HIP ARTHROPLASTY;  Surgeon: Kerin Salen, MD;  Location: [facility];  Service: Orthopedics;  Laterality: Right;  . TOTAL SHOULDER ARTHROPLASTY Right 07/02/2018   Procedure: RIGHT TOTAL SHOULDER ARTHROPLASTY;  Surgeon: Marybelle Killings, MD;  Location: [facility];  Service: Orthopedics;  Laterality: Right;  Marland Kitchen VAGINAL HYSTERECTOMY      Social History   Socioeconomic History  . Marital status: Widowed    Spouse name: Not on file  . Number of children: 5  . Years of education: 8th grade  . Highest education level: Not on file  Occupational History  . Occupation: Worked in Holiday representative for 30 years    Employer: RETIRED    Comment: Retired  . Occupation: Part time care giver  Social Needs  . Financial resource strain: Somewhat hard  . Food insecurity    Worry: Sometimes true    Inability: Never true  . Transportation needs    Medical: No    Non-medical: No  Tobacco Use  . Smoking status: Former Smoker    Years: 60.00    Types: Cigarettes    Quit date: 09/12/2018    Years since quitting: 0.1  . Smokeless tobacco: Never Used  Substance and Sexual Activity  . Alcohol use: Not Currently    Comment: 11/27/2017 "drank some in my 20s"  . Drug use: No  . Sexual activity: Not Currently  Lifestyle  . Physical activity    Days per week: 0 days    Minutes per session: 0 min  . Stress: Only a little  Relationships  . Social connections    Talks on phone: More than three times a week    Gets together: More than three times a week    Attends religious service: More than 4 times per year    Active member of club or organization: Yes    Attends meetings of clubs or organizations: More than 4 times per year    Relationship status: Widowed  . Intimate  partner violence    Fear of current or ex partner: No    Emotionally abused: No    Physically abused: No    Forced sexual activity: No  Other Topics Concern  . Not on file  Social History Narrative   Ms Loyd is divorced. Her adult son lives with her and he is disabled due to bilateral AKA due to diabetes.  She helps to provide care to him. She has three adult daughters that live in the [place] area. She talks with them daily and they come to visit once or twice a year. She has 7 grandchildren also.     Outpatient Encounter Medications as of 11/04/2018  Medication Sig  . albuterol (PROVENTIL) (2.5 MG/3ML) 0.083% nebulizer solution Take 2.5 mg by nebulization every 6 (six) hours  as needed for wheezing or shortness of breath.   Marland Kitchen albuterol (VENTOLIN HFA) 108 (90 Base) MCG/ACT inhaler Inhale 1-2 puffs into the lungs every 6 (six) hours as needed for wheezing or shortness of breath.  Marland Kitchen aspirin EC 81 MG tablet Take 81 mg by mouth every evening.   . Biotin 5 MG TABS Take 5 mg by mouth every morning.   Marland Kitchen buPROPion (WELLBUTRIN SR) 150 MG 12 hr tablet TAKE (1) TABLET TWICE A DAY.  . carvedilol (COREG) 6.25 MG tablet TAKE  (1)  TABLET TWICE A DAY WITH MEALS (BREAKFAST AND SUPPER) (Patient taking differently: Take 6.25 mg by mouth 2 (two) times daily with a meal. )  . furosemide (LASIX) 20 MG tablet TAKE 2 TABLETS DAILY FOR 2 DAYS, THEN DECREASE BACK TO 1 DAILY (Patient taking differently: Take 20 mg by mouth daily. )  . gabapentin (NEURONTIN) 100 MG capsule Take 1 capsule (100 mg total) by mouth 3 (three) times daily.  Marland Kitchen loratadine (CLARITIN) 10 MG tablet Take 1 tablet (10 mg total) by mouth daily.  Marland Kitchen losartan (COZAAR) 50 MG tablet TAKE (1/2) TABLET DAILY. (Patient taking differently: Take 25 mg by mouth daily. TAKE (1/2) TABLET DAILY.)  . memantine (NAMENDA) 10 MG tablet Take 1 tablet (10 mg total) by mouth 2 (two) times daily for 30 days.  . Multiple Vitamins-Minerals (CENTRUM SILVER PO) Take 1 tablet by  mouth every morning.   Marland Kitchen omeprazole (PRILOSEC) 40 MG capsule Take 40 mg by mouth daily.  . ondansetron (ZOFRAN) 4 MG tablet Take 1 tablet (4 mg total) by mouth every 8 (eight) hours as needed for nausea or vomiting.  . potassium chloride SA (K-DUR) 20 MEQ tablet TAKE 1 TABLET DAILY  . pravastatin (PRAVACHOL) 80 MG tablet Take 1 tablet (80 mg total) by mouth daily.  . raloxifene (EVISTA) 60 MG tablet Take 1 tablet (60 mg total) by mouth daily.  Marland Kitchen Spacer/Aero-Holding Chambers (AEROCHAMBER MV) inhaler Use as instructed  . [DISCONTINUED] albuterol (PROVENTIL HFA;VENTOLIN HFA) 108 (90 Base) MCG/ACT inhaler Inhale 1-2 puffs into the lungs every 6 (six) hours as needed for wheezing or shortness of breath.  . [DISCONTINUED] budesonide-formoterol (SYMBICORT) 160-4.5 MCG/ACT inhaler TAKE 2 PUFFS BY MOUTH TWICE A DAY  . Fluticasone-Umeclidin-Vilant (TRELEGY ELLIPTA) 100-62.5-25 MCG/INH AEPB Inhale 1 Inhaler into the lungs daily.  . [DISCONTINUED] phentermine (ADIPEX-P) 37.5 MG tablet Take 37.5 mg by mouth daily before breakfast.   No facility-administered encounter medications on file as of 11/04/2018.     Allergies  Allergen Reactions  . Lisinopril-Hydrochlorothiazide Swelling, Other (See Comments) and Cough    Lip swelling and cough. Stopped by ENT. Patient is on Losartan without any issues   . Codeine Itching and Swelling    eye irritation (prescribed percocet)  . Penicillins Itching and Swelling    PATIENT HAS HAD A PCN REACTION WITH IMMEDIATE RASH, FACIAL/TONGUE/THROAT SWELLING, SOB, OR LIGHTHEADEDNESS WITH HYPOTENSION:  #  #  YES  #  #  Has patient had a PCN reaction causing severe rash involving mucus membranes or skin necrosis: No Has patient had a PCN reaction that required hospitalization No Has patient had a PCN reaction occurring within the last 10 years: No eye irritation  . Aricept [Donepezil Hcl] Nausea And Vomiting  . Namenda [Memantine Hcl] Nausea And Vomiting    Review of Systems   Constitutional: Negative for activity change, appetite change, chills, diaphoresis, fatigue, fever and unexpected weight change.  HENT: Negative for congestion.   Respiratory: Positive for  shortness of breath. Negative for cough, chest tightness and wheezing.   Cardiovascular: Negative for chest pain, palpitations and leg swelling.  Gastrointestinal: Negative for abdominal pain, anal bleeding, blood in stool, diarrhea, nausea and vomiting.  Genitourinary: Negative for decreased urine volume and difficulty urinating.  Neurological: Negative for dizziness, tremors, seizures, syncope, facial asymmetry, speech difficulty, weakness, light-headedness, numbness and headaches.  Psychiatric/Behavioral: Positive for confusion (baseline).  All other systems reviewed and are negative.       Objective:  BP 128/70   Pulse (!) 56   Temp 97.7 F (36.5 C) (Oral)   Ht 5' 4" (1.626 m)   Wt 158 lb 9.6 oz (71.9 kg)   SpO2 97%   BMI 27.22 kg/m    Wt Readings from Last 3 Encounters:  11/04/18 158 lb 9.6 oz (71.9 kg)  10/14/18 162 lb 9.6 oz (73.8 kg)  10/05/18 158 lb 8.2 oz (71.9 kg)    Physical Exam Vitals signs and nursing note reviewed.  Constitutional:      General: She is not in acute distress.    Appearance: Normal appearance. She is well-developed and well-groomed. She is not ill-appearing, toxic-appearing or diaphoretic.  HENT:     Head: Normocephalic and atraumatic.     Jaw: There is normal jaw occlusion.     Right Ear: Hearing normal.     Left Ear: Hearing normal.     Nose: Nose normal.     Mouth/Throat:     Lips: Pink.     Mouth: Mucous membranes are moist.     Pharynx: Oropharynx is clear. Uvula midline.  Eyes:     General: Lids are normal.     Extraocular Movements: Extraocular movements intact.     Conjunctiva/sclera: Conjunctivae normal.     Pupils: Pupils are equal, round, and reactive to light.  Neck:     Musculoskeletal: Normal range of motion and neck supple.      Thyroid: No thyroid mass, thyromegaly or thyroid tenderness.     Vascular: No carotid bruit or JVD.     Trachea: Trachea and phonation normal.  Cardiovascular:     Rate and Rhythm: Normal rate and regular rhythm.     Chest Wall: PMI is not displaced.     Pulses: Normal pulses.     Heart sounds: Normal heart sounds. No murmur. No friction rub. No gallop.   Pulmonary:     Effort: Pulmonary effort is normal. No respiratory distress.     Breath sounds: Examination of the right-lower field reveals rhonchi. Examination of the left-lower field reveals rhonchi. Wheezing (mild, scattered) and rhonchi (mild) present.  Abdominal:     General: Bowel sounds are normal. There is no distension or abdominal bruit.     Palpations: Abdomen is soft. There is no hepatomegaly or splenomegaly.     Tenderness: There is no abdominal tenderness. There is no right CVA tenderness or left CVA tenderness.     Hernia: No hernia is present.  Musculoskeletal: Normal range of motion.     Right lower leg: No edema.     Left lower leg: No edema.  Lymphadenopathy:     Cervical: No cervical adenopathy.  Skin:    General: Skin is warm and dry.     Capillary Refill: Capillary refill takes less than 2 seconds.     Coloration: Skin is not cyanotic, jaundiced or pale.     Findings: No rash.  Neurological:     General: No focal deficit present.  Mental Status: She is alert and oriented to person, place, and time.     Cranial Nerves: Cranial nerves are intact.     Sensory: Sensation is intact.     Motor: Motor function is intact.     Coordination: Coordination is intact.     Gait: Gait is intact.     Deep Tendon Reflexes: Reflexes are normal and symmetric.  Psychiatric:        Attention and Perception: Attention and perception normal.        Mood and Affect: Mood and affect normal.        Speech: Speech normal.        Behavior: Behavior normal. Behavior is cooperative.        Thought Content: Thought content normal.         Cognition and Memory: Cognition and memory normal.        Judgment: Judgment normal.     Results for orders placed or performed in visit on 10/14/18  Magnesium  Result Value Ref Range   Magnesium 2.1 1.6 - 2.3 mg/dL  CMP14+EGFR  Result Value Ref Range   Glucose 98 65 - 99 mg/dL   BUN 20 8 - 27 mg/dL   Creatinine, Ser 1.09 (H) 0.57 - 1.00 mg/dL   GFR calc non Af Amer 48 (L) >59 mL/min/1.73   GFR calc Af Amer 55 (L) >59 mL/min/1.73   BUN/Creatinine Ratio 18 12 - 28   Sodium 145 (H) 134 - 144 mmol/L   Potassium 3.9 3.5 - 5.2 mmol/L   Chloride 108 (H) 96 - 106 mmol/L   CO2 22 20 - 29 mmol/L   Calcium 8.9 8.7 - 10.3 mg/dL   Total Protein 5.8 (L) 6.0 - 8.5 g/dL   Albumin 4.0 3.6 - 4.6 g/dL   Globulin, Total 1.8 1.5 - 4.5 g/dL   Albumin/Globulin Ratio 2.2 1.2 - 2.2   Bilirubin Total <0.2 0.0 - 1.2 mg/dL   Alkaline Phosphatase 98 39 - 117 IU/L   AST 24 0 - 40 IU/L   ALT 19 0 - 32 IU/L  Thyroid Panel With TSH  Result Value Ref Range   TSH 0.832 0.450 - 4.500 uIU/mL   T4, Total 6.7 4.5 - 12.0 ug/dL   T3 Uptake Ratio 27 24 - 39 %   Free Thyroxine Index 1.8 1.2 - 4.9  Phosphorus  Result Value Ref Range   Phosphorus 2.7 (L) 3.0 - 4.3 mg/dL  Brain natriuretic peptide  Result Value Ref Range   BNP 168.7 (H) 0.0 - 100.0 pg/mL       Pertinent labs & imaging results that were available during my care of the patient were reviewed by me and considered in my medical decision making.  Assessment & Plan:  [place] was seen today for hypertension, shortness of breath and medical management of chronic issues.  Diagnoses and all orders for this visit:  Essential hypertension Doing well on current medications. Continue. DASH diet discussed. Follow up in 3 months.   Chronic obstructive pulmonary disease, unspecified COPD type ([place]) Due to increased exertional shortness of breath will stop Symbicort and initiate Trelegy. No increased cough, sputum production, or fever. No indication  for antibiotic therapy. Pt to report any new or worsening symptoms. Follow up in 6 weeks for reevaluation.  -     Fluticasone-Umeclidin-Vilant (TRELEGY ELLIPTA) 100-62.5-25 MCG/INH AEPB; Inhale 1 Inhaler into the lungs daily. -     albuterol (VENTOLIN HFA) 108 (90 Base) MCG/ACT inhaler; Inhale 1-2  puffs into the lungs every 6 (six) hours as needed for wheezing or shortness of breath.  Chronic diastolic heart failure (HCC) Swelling in lower extremities has subsided. Pt denies orthopnea. Only has exertional shortness of breath. Taking medications as prescribed. No chest pain, palpitations, weight gain, or syncope.     Continue all other maintenance medications.  Follow up plan: Return in about 6 weeks (around 12/16/2018), or if symptoms worsen or fail to improve, for COPD.  Educational handout provided for trelegy  The above assessment and management plan was discussed with the patient. The patient verbalized understanding of and has agreed to the management plan. Patient is aware to call the clinic if symptoms persist or worsen. Patient is aware when to return to the clinic for a follow-up visit. Patient educated on when it is appropriate to go to the emergency department.   Monia Pouch, FNP-C [facility] [phone]

## 2018-11-04 NOTE — Telephone Encounter (Signed)
Patients daughter wants to know how the visit went today and also would like her alcohol level tested next time she comes in.

## 2018-11-05 NOTE — Telephone Encounter (Signed)
Spoke with daughter and pt

## 2018-11-12 ENCOUNTER — Ambulatory Visit (INDEPENDENT_AMBULATORY_CARE_PROVIDER_SITE_OTHER): Payer: Medicare Other

## 2018-11-12 ENCOUNTER — Other Ambulatory Visit: Payer: Self-pay

## 2018-11-12 DIAGNOSIS — I6523 Occlusion and stenosis of bilateral carotid arteries: Secondary | ICD-10-CM | POA: Diagnosis not present

## 2018-11-13 ENCOUNTER — Ambulatory Visit: Payer: Medicare Other | Admitting: *Deleted

## 2018-11-13 DIAGNOSIS — R413 Other amnesia: Secondary | ICD-10-CM

## 2018-11-13 DIAGNOSIS — I10 Essential (primary) hypertension: Secondary | ICD-10-CM

## 2018-11-13 NOTE — Chronic Care Management (AMB) (Signed)
  Chronic Care Management   Note  11/13/2018 Name: [name] MRN: [id number] DOB: 21-Jul-1934  [name] was referred by her PCP for CCM services. She was contacted and offered services by [name] Lipa, LCSW on 11/03/2018. She did not consent to services at that time but agreed to a follow-up telephone call with him in 2 weeks.   Chart reviewed and I consulted with [name].  [name] has multiple chronic medical conditions that include diastolic heart failure, COPD, HTN, mild dementia, occipital nueralgia, cardiomyopathy, depression, and dizziness and she would likely benefit from chronic care management and care coordination. She has [employer] which covers CCM services at 100% and she would not owe anything out-of-pocket.    Follow up plan: LCSW scheduled to contact patient tomorrow RNCM to folllow-up over the 30 days if patient consents to [place] BSN, RN-BC [place] [facility] [place] Direct Dial: [phone]

## 2018-11-14 ENCOUNTER — Ambulatory Visit (INDEPENDENT_AMBULATORY_CARE_PROVIDER_SITE_OTHER): Payer: Medicare Other | Admitting: Physician Assistant

## 2018-11-14 ENCOUNTER — Ambulatory Visit: Payer: Self-pay | Admitting: Licensed Clinical Social Worker

## 2018-11-14 ENCOUNTER — Encounter: Payer: Self-pay | Admitting: Physician Assistant

## 2018-11-14 DIAGNOSIS — R0602 Shortness of breath: Secondary | ICD-10-CM

## 2018-11-14 DIAGNOSIS — F03A0 Unspecified dementia, mild, without behavioral disturbance, psychotic disturbance, mood disturbance, and anxiety: Secondary | ICD-10-CM

## 2018-11-14 DIAGNOSIS — K219 Gastro-esophageal reflux disease without esophagitis: Secondary | ICD-10-CM

## 2018-11-14 DIAGNOSIS — F339 Major depressive disorder, recurrent, unspecified: Secondary | ICD-10-CM

## 2018-11-14 DIAGNOSIS — F0390 Unspecified dementia without behavioral disturbance: Secondary | ICD-10-CM

## 2018-11-14 DIAGNOSIS — F411 Generalized anxiety disorder: Secondary | ICD-10-CM

## 2018-11-14 DIAGNOSIS — J449 Chronic obstructive pulmonary disease, unspecified: Secondary | ICD-10-CM

## 2018-11-14 DIAGNOSIS — I10 Essential (primary) hypertension: Secondary | ICD-10-CM

## 2018-11-14 MED ORDER — OMEPRAZOLE 40 MG PO CPDR: 40.0000 mg | DELAYED_RELEASE_CAPSULE | Freq: Every day | ORAL | 11 refills | Status: AC

## 2018-11-14 NOTE — Progress Notes (Signed)
Telephone visit  Subjective: CC: Indigestion PCP: Sonny Masters, FNP [name] is a 84 y.o. female calls for telephone consult today. Patient provides verbal consent for consult held via phone.  Patient is identified with 2 separate identifiers.  At this time the entire area is on COVID-19 social distancing and stay home orders are in place.  Patient is of higher risk and therefore we are performing this by a virtual method.  Location of patient: Home Location of provider: [facility] Others present for call: Daughter  This patient is accompanied by her daughter.  She has been having significant indigestion over the past 2 or 3 days.  She is experiencing a little bit of runny nose and feels even a little bit short of breath.  In reviewing her chart it does show that she has GERD.  However she has not been taking her omeprazole 40 mg a day.  They have looked all through the medication and cannot find it.  I am going to resend the medication and have asked them to have a very bland diet for the next 2 or 3 days.  She had been taking a lot of Tums.  She can still use those occasionally but her need for them should go down drastically.  Also the runny nose and shortness of breath can be coming from the reflux being so bad.  She denies any vomiting or blood.  Her bowel movements are normal.  She denies any fever or chills.    ROS: Per HPI  Allergies  Allergen Reactions  . Lisinopril-Hydrochlorothiazide Swelling, Other (See Comments) and Cough    Lip swelling and cough. Stopped by ENT. Patient is on Losartan without any issues   . Codeine Itching and Swelling    eye irritation (prescribed percocet)  . Penicillins Itching and Swelling    PATIENT HAS HAD A PCN REACTION WITH IMMEDIATE RASH, FACIAL/TONGUE/THROAT SWELLING, SOB, OR LIGHTHEADEDNESS WITH HYPOTENSION:  #  #  YES  #  #  Has patient had a PCN reaction causing severe rash involving mucus membranes or skin necrosis: No Has  patient had a PCN reaction that required hospitalization No Has patient had a PCN reaction occurring within the last 10 years: No eye irritation  . Aricept [Donepezil Hcl] Nausea And Vomiting  . Namenda [Memantine Hcl] Nausea And Vomiting   Past Medical History:  Diagnosis Date  . Allergic rhinitis   . Anxiety   . Arthritis   . Asthma   . CAP (community acquired pneumonia) 11/26/2017  . COPD (chronic obstructive pulmonary disease) (HCC)   . Depression   . Dyspnea    with exertion   . Elevated troponin 04/05/2016  . Essential hypertension, benign   . GERD (gastroesophageal reflux disease)   . Headache    occiptal neuralgia - left   . Heart murmur   . History of blood transfusion    "related to miscarriage"  . Left bundle branch block    in & out of A fib  . Memory loss   . Mixed hyperlipidemia   . Occipital neuralgia of left side   . Osteopenia   . 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 Urinary urgency     Current Outpatient Medications:  .  albuterol (PROVENTIL) (2.5 MG/3ML) 0.083% nebulizer solution, Take 2.5 mg by nebulization every 6 (six) hours as needed for wheezing or shortness of breath. , Disp: , Rfl:  .  albuterol (  VENTOLIN HFA) 108 (90 Base) MCG/ACT inhaler, Inhale 1-2 puffs into the lungs every 6 (six) hours as needed for wheezing or shortness of breath., Disp: 18 g, Rfl: 11 .  aspirin EC 81 MG tablet, Take 81 mg by mouth every evening. , Disp: , Rfl:  .  Biotin 5 MG TABS, Take 5 mg by mouth every morning. , Disp: , Rfl:  .  buPROPion (WELLBUTRIN SR) 150 MG 12 hr tablet, TAKE (1) TABLET TWICE A DAY., Disp: 180 tablet, Rfl: 1 .  carvedilol (COREG) 6.25 MG tablet, TAKE  (1)  TABLET TWICE A DAY WITH MEALS (BREAKFAST AND SUPPER) (Patient taking differently: Take 6.25 mg by mouth 2 (two) times daily with a meal. ), Disp: 180 tablet, Rfl: 2 .  Fluticasone-Umeclidin-Vilant (TRELEGY ELLIPTA) 100-62.5-25 MCG/INH AEPB, Inhale 1 Inhaler into the  lungs daily., Disp: 60 each, Rfl: 3 .  furosemide (LASIX) 20 MG tablet, TAKE 1 TABLET ONCE DAILY, Disp: 90 tablet, Rfl: 3 .  gabapentin (NEURONTIN) 100 MG capsule, Take 1 capsule (100 mg total) by mouth 3 (three) times daily., Disp: 90 capsule, Rfl: 3 .  loratadine (CLARITIN) 10 MG tablet, Take 1 tablet (10 mg total) by mouth daily., Disp: 30 tablet, Rfl: 5 .  losartan (COZAAR) 50 MG tablet, TAKE (1/2) TABLET DAILY. (Patient taking differently: Take 25 mg by mouth daily. TAKE (1/2) TABLET DAILY.), Disp: 45 tablet, Rfl: 0 .  memantine (NAMENDA) 10 MG tablet, Take 1 tablet (10 mg total) by mouth 2 (two) times daily for 30 days., Disp: 60 tablet, Rfl: 3 .  Multiple Vitamins-Minerals (CENTRUM SILVER PO), Take 1 tablet by mouth every morning. , Disp: , Rfl:  .  omeprazole (PRILOSEC) 40 MG capsule, Take 1 capsule (40 mg total) by mouth daily., Disp: 30 capsule, Rfl: 11 .  ondansetron (ZOFRAN) 4 MG tablet, Take 1 tablet (4 mg total) by mouth every 8 (eight) hours as needed for nausea or vomiting., Disp: 20 tablet, Rfl: 0 .  potassium chloride SA (K-DUR) 20 MEQ tablet, TAKE 1 TABLET DAILY, Disp: 90 tablet, Rfl: 1 .  pravastatin (PRAVACHOL) 80 MG tablet, Take 1 tablet (80 mg total) by mouth daily., Disp: 90 tablet, Rfl: 1 .  raloxifene (EVISTA) 60 MG tablet, Take 1 tablet (60 mg total) by mouth daily., Disp: 90 tablet, Rfl: 0 .  Spacer/Aero-Holding Chambers (AEROCHAMBER MV) inhaler, Use as instructed, Disp: 1 each, Rfl: 0  Assessment/ Plan: 84 y.o. female   1. Gastroesophageal reflux disease without esophagitis [place] diet 2-3 days - omeprazole (PRILOSEC) 40 MG capsule; Take 1 capsule (40 mg total) by mouth daily.  Dispense: 30 capsule; Refill: 11   No follow-ups on file.  Continue all other maintenance medications as listed above.  Start time: 9:41 AM End time: 9:53 AM  Meds ordered this encounter  Medications  . omeprazole (PRILOSEC) 40 MG capsule    Sig: Take 1 capsule (40 mg total) by mouth  daily.    Dispense:  30 capsule    Refill:  11    Order Specific Question:   Supervising Provider    Answer:   Janora Norlander [[id number]]    Particia Nearing PA-C [place] [phone]

## 2018-11-14 NOTE — Chronic Care Management (AMB) (Signed)
  Care Management Note   CHEZNEY HUETHER is a 84 y.o. year old female who is a primary care patient of [name], Connye Burkitt, FNP. The CM team was consulted for assistance with chronic disease management and care coordination.   I reached out to [name] Letitia Caul  by phone today.   Ms. Leiber Evon Slack, daughter, were given information about Chronic Care Management services today including:  1. CCM service includes personalized support from designated clinical staff supervised by her physician, including individualized plan of care and coordination with other care providers 2. 24/7 contact phone numbers for assistance for urgent and routine care needs. 3. Service will only be billed when office clinical staff spend 20 minutes or more in a month to coordinate care. 4. Only one practitioner may furnish and bill the service in a calendar month. 5. The patient may stop CCM services at any time (effective at the end of the month) by phone call to the office staff. 6. The patient will be responsible for cost sharing (co-pay) of up to 20% of the service fee (after annual deductible is met). Patient/daughter Evon Slack did not agree to services and wish to consider information provided before deciding about enrollment in care management services.    Review of patient status, including review of consultants reports, relevant laboratory and other test results, and collaboration with appropriate care team members and the patient's provider was performed as part of comprehensive patient evaluation and provision of chronic care management services.   LCSW spoke via phone on 11/14/2018 with [name] and daughter, Evon Slack. LCSW talked with [name] and [name] about CCM program services with [facility] for nursing support and with LCSW for social work support LCSW informed Lauriana and [name] that a copay charge  could be charged monthly to [facility] client account.  [name] and [name] understood  this information. [name] reported that client was having memory issues. LCSW talked with [name] and [name] about HCPOA and how a HCPOA is sometimes helpful in caring for family members in need. [name] said she would talk with her older sister about CCM program information shared by LCSW. LCSW gave Cena Benton LCSW phone number of [phone] to call LCSW to further discuss CCM program support.,   Follow Up Plan: LCSW to call client or daughter, Evon Slack, in next 3 weeks to talk further with [name] or [name] about CCM program services.  [name].Kathelene Rumberger MSW, LCSW Licensed Clinical Social Worker [facility] [phone]

## 2018-11-14 NOTE — Patient Instructions (Signed)
Licensed Clinical Social Worker Visit Information  Materials Provided:  No   Ms. [name]  [name] were given information about Chronic Care Management services today including:   1. CCM service includes personalized support from designated clinical staff supervised by her physician, including individualized plan of care and coordination with other care providers 2. 24/7 contact phone numbers for assistance for urgent and routine care needs. 3. Service will only be billed when office clinical staff spend 20 minutes or more in a month to coordinate care. 4. Only one practitioner may furnish and bill the service in a calendar month. 5. The patient may stop CCM services at any time (effective at the end of the month) by phone call to the office staff. 6. The patient will be responsible for cost sharing (co-pay) of up to 20% of the service fee (after annual deductible is met).  Patient /daughter, [name] did not agree to services and wish to consider information provided before deciding about enrollment in care management servieces.   Follow Up Plan: LCSW to call client or daughter, [name] in next 3 weeks to talk with client or daughter further about CCM program services  The patient/daughter, [name] verbalized understanding of instructions provided today and declined a print copy of patient instruction materials.   [name].Collis Thede MSW, LCSW Licensed Clinical Social Worker [facility] [phone]

## 2018-11-19 ENCOUNTER — Other Ambulatory Visit: Payer: Self-pay | Admitting: Cardiology

## 2018-11-19 DIAGNOSIS — I6523 Occlusion and stenosis of bilateral carotid arteries: Secondary | ICD-10-CM

## 2018-11-26 NOTE — Progress Notes (Signed)
PATIENT: [name] DOB: [birth year]  REASON FOR VISIT: follow up HISTORY FROM: patient  HISTORY OF PRESENT ILLNESS: Today 11/27/18  HISTORY [name] is a 84 years old right-handed female, accompanied by her daughter [name], seen in refer by her primary care physician Dr.  Eustaquio [name] for evaluation of memory loss, abnormal MRI scan in Sep 13 2015  She had a history of hypertension, hyperlipidemia, depression, lumbar decompression surgery twice, history of right hip replacement.  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   She noticed memory loass since 2015, gradually getting worse, she got lost while driving on the familiar route, she could not find her way home after she dropped her son to work one morning, she has to call her daughter to find her, she began to make mistakes with her bill payment, her daughter now take over, she has lost interest in cooking, Her mother has Alzheimer's disease   have personally reviewed MRI brian in April 2017 significant atrophy Laboratory evaluation normal CBC, CMP showed elevated creatinine 6.73, normal A19 folic acid.  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 Marked bilateral parietal lobe atrophy.  Mild chronic microvascular changes. 1.8 cm cyst superior to the velum interpositum unchanged and most likely an incidental finding.  Stable  marked left C1-2 lateral mass degenerative changes  05/29/18 SS: [name] is an 84 year old female presenting for worsening memory accompanied by her granddaughter, [name]. She lives with her son. She is still driving short distances in [place]. Today she took public transportation to this appointment. She has gotten lost twice and she didn't know her way home. It has been over a year since she has gotten lost. Her memory problems aren't everyday. She is wearing oxygen PRN, and her memory has gotten better. She does her own housekeeping and ADLs. Her daughter, [name] handles her finances. Her appetite is pretty good and she does her own cooking. Denies weight loss. Denies falls. At times she will use a cane. She sleeps good at night, mostly. She finds difficulty remembering where she puts things. Her son has to remind her of appointments and obligations. She finds that she forgets conversations she has had. She just recently went on a cruise to the [place] in December. She reports being treated for pneumonia recently and is now using oxygen as needed. She presents today for follow-up.  Update November 26, 3344 SS: 84 year old with history of memory disturbance, strong family history of Alzheimer's disease, significant atrophy on brain MRI, was unable to tolerate Aricept, restarted Namenda after last visit.  She was hospitalized June 2020 for shortness of breath and wheezing she had right shoulder replacement earlier this year with Dr. Lorin [name]. She lives in  [place], [place] drive there, only short distances. She doesn't drive at night. She had transportation bring her today. Her son lives with her, he  is an amputee. She does her own ADLs. Her daughter manages her finances. Her pills come in a pill pack that she can manage. She doesn't have much appetite. She feels her memory is getting worse, she will start doing something at home and forget. She has her son watch behind her when she is cooking. She  likes to walk to her mailbox a few times a day. During the day, watch TV, manage her household. She has tracker on her car, has not gotten lost recently. Her walking is doing well, no falls. She sleeps well at night, may wake up around 4 or 5 am, not able to go back to sleep.  Is alone at today's appointment, has asked that I contact her daughter [name].  REVIEW OF SYSTEMS: Out of a complete 14 system review of symptoms, the patient complains only of the following symptoms, and all other reviewed systems are negative.  Memory loss  ALLERGIES: Allergies  Allergen Reactions   Lisinopril-Hydrochlorothiazide Swelling, Other (See Comments) and Cough    Lip swelling and cough. Stopped by ENT. Patient is on Losartan without any issues    Codeine Itching and Swelling    eye irritation (prescribed percocet)   Penicillins Itching and Swelling    PATIENT HAS HAD A PCN REACTION WITH IMMEDIATE RASH, FACIAL/TONGUE/THROAT SWELLING, SOB, OR LIGHTHEADEDNESS WITH HYPOTENSION:  #  #  YES  #  #  Has patient had a PCN reaction causing severe rash involving mucus membranes or skin necrosis: No Has patient had a PCN reaction that required hospitalization No Has patient had a PCN reaction occurring within the last 10 years: No eye irritation   Aricept [Donepezil Hcl] Nausea And Vomiting   Namenda [Memantine Hcl] Nausea And Vomiting    HOME MEDICATIONS: Outpatient Medications Prior to Visit  Medication Sig Dispense Refill   albuterol (PROVENTIL) (2.5 MG/3ML) 0.083% nebulizer solution Take 2.5 mg by nebulization every 6 (six) hours as needed for wheezing or shortness of breath.      albuterol (VENTOLIN HFA) 108 (90 Base) MCG/ACT inhaler Inhale 1-2 puffs into the lungs every 6 (six) hours as needed for wheezing or shortness of breath. 18 g 11   aspirin EC 81 MG tablet Take 81 mg by mouth every evening.      Biotin 5 MG TABS Take 5 mg by mouth every morning.      buPROPion (WELLBUTRIN SR) 150 MG 12 hr  tablet TAKE (1) TABLET TWICE A DAY. 180 tablet 1   carvedilol (COREG) 6.25 MG tablet TAKE  (1)  TABLET TWICE A DAY WITH MEALS (BREAKFAST AND SUPPER) (Patient taking differently: Take 6.25 mg by mouth 2 (two) times daily with a meal. ) 180 tablet 2   Fluticasone-Umeclidin-Vilant (TRELEGY ELLIPTA) 100-62.5-25 MCG/INH AEPB Inhale 1 Inhaler into the lungs daily. 60 each 3   furosemide (LASIX) 20 MG tablet TAKE 1 TABLET ONCE DAILY 90 tablet 3   gabapentin (NEURONTIN) 100 MG capsule Take 1 capsule (100 mg total) by mouth 3 (three) times daily. 90 capsule 3   loratadine (CLARITIN) 10 MG tablet Take 1 tablet (10 mg total) by mouth daily. 30 tablet 5   losartan (COZAAR) 50 MG tablet TAKE (1/2) TABLET DAILY. (Patient taking differently: Take 25 mg by mouth daily. TAKE (1/2) TABLET DAILY.) 45 tablet 0   Multiple Vitamins-Minerals (CENTRUM SILVER PO) Take 1 tablet by mouth every morning.      omeprazole (PRILOSEC) 40 MG capsule Take 1 capsule (40 mg total) by mouth daily.  30 capsule 11   ondansetron (ZOFRAN) 4 MG tablet Take 1 tablet (4 mg total) by mouth every 8 (eight) hours as needed for nausea or vomiting. 20 tablet 0   potassium chloride [name] (K-DUR) 20 MEQ tablet TAKE 1 TABLET DAILY 90 tablet 1   pravastatin (PRAVACHOL) 80 MG tablet TAKE 1 TABLET DAILY 90 tablet 3   raloxifene (EVISTA) 60 MG tablet Take 1 tablet (60 mg total) by mouth daily. 90 tablet 0   Spacer/Aero-Holding Chambers (AEROCHAMBER MV) inhaler Use as instructed 1 each 0   memantine (NAMENDA) 10 MG tablet Take 1 tablet (10 mg total) by mouth 2 (two) times daily for 30 days. 60 tablet 3   No facility-administered medications prior to visit.     PAST MEDICAL HISTORY: Past Medical History:  Diagnosis Date   Allergic rhinitis    Anxiety    Arthritis    Asthma    CAP (community acquired pneumonia) 11/26/2017   COPD (chronic obstructive pulmonary disease) (HCC)    Depression    Dyspnea    with exertion     Elevated troponin 04/05/2016   Essential hypertension, benign    GERD (gastroesophageal reflux disease)    Headache    occiptal neuralgia - left    Heart murmur    History of blood transfusion    "related to miscarriage"   Left bundle branch block    in & out of A fib   Memory loss    Mixed hyperlipidemia    Occipital neuralgia of left side    Osteopenia    Secondary cardiomyopathy (HCC)    LVEF 40-45%, likely nonischemic   Urinary urgency     PAST SURGICAL HISTORY: Past Surgical History:  Procedure Laterality Date   APPENDECTOMY     BACK SURGERY     CARDIAC CATHETERIZATION  2010   Normal coronaries   CATARACT EXTRACTION W/PHACO  04/16/2011   Procedure: CATARACT EXTRACTION PHACO AND INTRAOCULAR LENS PLACEMENT (IOC);  Surgeon: Susa Simmonds;  Location: [facility];  Service: Ophthalmology;  Laterality: Left;  CDE=11.35   CATARACT EXTRACTION W/PHACO  01/28/2012   Procedure: CATARACT EXTRACTION PHACO AND INTRAOCULAR LENS PLACEMENT (IOC);  Surgeon: Susa Simmonds, MD;  Location: [facility];  Service: Ophthalmology;  Laterality: Right;  CDI:8.15   CYSTO WITH HYDRODISTENSION  05/29/2012   Procedure: CYSTOSCOPY/HYDRODISTENSION;  Surgeon: Martina Sinner, MD;  Location: [facility];  Service: Urology;  Laterality: N/A;  INSTILLATION OF MARCAINE AND PYRIDIUM    DILATION AND CURETTAGE OF UTERUS  <hysterectomy"   EYE SURGERY     HAMMER TOE SURGERY Bilateral 11/22/11   [facility]   JOINT REPLACEMENT     LAPAROSCOPIC CHOLECYSTECTOMY  1991   LUMBAR LAMINECTOMY  2011   LUMBAR LAMINECTOMY/DECOMPRESSION MICRODISCECTOMY Left 06/06/2012   Procedure: LUMBAR LAMINECTOMY/DECOMPRESSION MICRODISCECTOMY 1 LEVEL;  Surgeon: Maeola Harman, MD;  Location: [facility];  Service: Neurosurgery;  Laterality: Left;  Left Lumbar two-three Laminectomy for resection of synovial cyst   SHOULDER ARTHROSCOPY Right 02/12/2018   SHOULDER ARTHROSCOPY, BICEPS DEBRIDEMENT, REMOVAL OF  LOOSE BODIES   SHOULDER ARTHROSCOPY Right 02/12/2018   Procedure: RIGHT SHOULDER ARTHROSCOPY, BICEPS DEBRIDEMENT, REMOVAL OF LOOSE BODIES;  Surgeon: Eldred Manges, MD;  Location: [facility];  Service: Orthopedics;  Laterality: Right;   SHOULDER OPEN ROTATOR CUFF REPAIR Right 2011   TONSILLECTOMY     TOTAL HIP ARTHROPLASTY Right 03/13/2013   Procedure: TOTAL HIP ARTHROPLASTY;  Surgeon: Nestor Lewandowsky, MD;  Location: [facility];  Service: Orthopedics;  Laterality: Right;   TOTAL SHOULDER ARTHROPLASTY Right 07/02/2018   Procedure: RIGHT TOTAL SHOULDER ARTHROPLASTY;  Surgeon: Eldred MangesYates, Mark C, MD;  Location: [facility];  Service: Orthopedics;  Laterality: Right;   VAGINAL HYSTERECTOMY      FAMILY HISTORY: Family History  Problem Relation Age of Onset   Diabetes Father    Diabetes Brother    Cancer Brother [id number]       colon   Diabetes Brother    Diabetes Brother    Hypertension Sister    Diabetes Sister    Diabetes Sister    Diabetes Other    Allergies Other    Diabetes Son    Colon cancer Neg Hx    Anesthesia problems Neg Hx    Hypotension Neg Hx    Malignant hyperthermia Neg Hx    Pseudochol deficiency Neg Hx     SOCIAL HISTORY: Social History   Socioeconomic History   Marital status: Widowed    Spouse name: Not on file   Number of children: 5   Years of education: 8th grade   Highest education level: Not on file  Occupational History   Occupation: Worked in Geneticist, 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    Employer: RETIRED    Comment: Retired   Occupation: Part time care giver  [employer]   Financial resource strain: Somewhat hard   Food insecurity    Worry: Sometimes true    Inability: Never true   Transportation needs    Medical: No    Non-medical: No  Tobacco Use   Smoking status: Former Smoker    Years: 60.00    Types: Cigarettes    Quit date: 09/12/2018    Years since quitting: 0.2   Smokeless tobacco: Never Used  Substance and Sexual Activity    Alcohol use: Not Currently    Comment: 11/27/2017 "drank some in my 20s"   Drug use: No   Sexual activity: Not Currently  Lifestyle   Physical activity    Days per week: 0 days    Minutes per session: 0 min   Stress: Only a little  Relationships   Social connections    Talks on phone: More than three times a week    Gets together: More than three times a week    Attends religious service: More than 4 times per year    Active member of club or organization: Yes    Attends meetings of clubs or organizations: More than 4 times per year    Relationship status: Widowed   Intimate partner violence    Fear of current or ex partner: No    Emotionally abused: No    Physically abused: No    Forced sexual activity: No  Other Topics Concern   Not on file  Social History Narrative   [name] is divorced. Her adult son lives with her and he is disabled due to bilateral AKA due to diabetes.  She helps to provide care to him. She has three adult daughters that live in the [place] area. She talks with them daily and they come to visit once or twice a year. She has 7 grandchildren also.     PHYSICAL EXAM  Vitals:   11/27/18 0825  BP: 127/70  Pulse: 70  Temp: (!) 96.9 F (36.1 C)  Weight: 161 lb 12.8 oz (73.4 kg)  Height: 5\' 4"  (1.626 m)   Body mass index is 27.77 kg/m.  Generalized: Well developed, in no acute  distress  MMSE - Mini Mental State Exam 11/27/2018 05/29/2018 06/12/2017  Orientation to time 2 2 3   Orientation to Place 2 4 3   Registration 3 3 3   Attention/ Calculation 0 0 4  Recall 2 1 2   Language- name 2 objects 2 2 2   Language- repeat 0 1 1  Language- follow 3 step command 3 3 3   Language- read & follow direction 1 1 1   Write a sentence 1 0 1  Copy design 1 1 1   Copy design-comments named 5 animals - -  Total score 17 18 24     Neurological examination  Mentation: Alert oriented to time, place, history taking. Follows all commands speech and language  fluent Cranial nerve II-XII: Pupils were equal round reactive to light. Extraocular movements were full, visual field were full on confrontational test. Facial sensation and strength were normal.  Head turning and shoulder shrug  were normal and symmetric. Motor: The motor testing reveals 5 over 5 strength of all 4 extremities. Good symmetric motor tone is noted throughout.  Sensory: Sensory testing is intact to soft touch on all 4 extremities. No evidence of extinction is noted.  Coordination: Cerebellar testing reveals good finger-nose-finger and heel-to-shin bilaterally.  Gait and station: Gait is normal.  Reflexes: Deep tendon reflexes are symmetric and normal bilaterally.   DIAGNOSTIC DATA (LABS, IMAGING, TESTING) - I reviewed patient records, labs, notes, testing and imaging myself where available.  Lab Results  Component Value Date   WBC 5.2 10/05/2018   HGB 13.5 10/05/2018   HCT 41.8 10/05/2018   MCV 93.5 10/05/2018   PLT 259 10/05/2018      Component Value Date/Time   NA 145 (H) 10/14/2018 1137   K 3.9 10/14/2018 1137   CL 108 (H) 10/14/2018 1137   CO2 22 10/14/2018 1137   GLUCOSE 98 10/14/2018 1137   GLUCOSE 150 (H) 10/05/2018 0420   BUN 20 10/14/2018 1137   CREATININE 1.09 (H) 10/14/2018 1137   CREATININE 0.94 10/27/2012 1028   CALCIUM 8.9 10/14/2018 1137   PROT 5.8 (L) 10/14/2018 1137   ALBUMIN 4.0 10/14/2018 1137   AST 24 10/14/2018 1137   ALT 19 10/14/2018 1137   ALKPHOS 98 10/14/2018 1137   BILITOT <0.2 10/14/2018 1137   GFRNONAA 48 (L) 10/14/2018 1137   GFRNONAA 60 10/27/2012 1028   GFRAA 55 (L) 10/14/2018 1137   GFRAA 69 10/27/2012 1028   Lab Results  Component Value Date   CHOL 176 09/30/2018   HDL 73 09/30/2018   LDLCALC 80 09/30/2018   LDLDIRECT 135 (H) 06/12/2017   TRIG 117 09/30/2018   CHOLHDL 2.4 09/30/2018   Lab Results  Component Value Date   HGBA1C 5.8 (H) 10/04/2013   Lab Results  Component Value Date   VITAMINB12 1,457 (H)  02/06/2018   Lab Results  Component Value Date   TSH 0.832 10/14/2018   ASSESSMENT AND PLAN 84 y.o. year old female  has a past medical history of Allergic rhinitis, Anxiety, Arthritis, Asthma, CAP (community acquired pneumonia) (11/26/2017), COPD (chronic obstructive pulmonary disease) (HCC), Depression, Dyspnea, Elevated troponin (04/05/2016), Essential hypertension, benign, GERD (gastroesophageal reflux disease), Headache, Heart murmur, History of blood transfusion, Left bundle branch block, Memory loss, Mixed hyperlipidemia, Occipital neuralgia of left side, Osteopenia, Secondary cardiomyopathy (HCC), and Urinary urgency. here with:  1. Memory disorder -MRI of the brain in 2017 showed significant atrophy -Her memory score is stable was 17/30 today, 18/30 last visit -She can continue Namenda 10 mg twice a  day, was filled by PCP in June -Unable to tolerate Aricept in the past -Her driving should be closely scrutinized, she tells me she only drives very short distances in her town, she has not had any problems, has not gotten lost, her daughter has a tracker on her car (I called her daughter, [name] and we discussed) -I encouraged her to exercise, eat healthy, drink plenty of water, incorporate brain stimulating exercises to promote memory -She will follow-up in 6 months or sooner if needed   I spent 15 minutes with the patient. 50% of this time was spent discussing her plan of care.   Margie Ege, AGNP-C, DNP 11/27/2018, 9:11 AM [facility] [address] [place], [place] [address] [phone]

## 2018-11-27 ENCOUNTER — Encounter: Payer: Self-pay | Admitting: Neurology

## 2018-11-27 ENCOUNTER — Ambulatory Visit (INDEPENDENT_AMBULATORY_CARE_PROVIDER_SITE_OTHER): Payer: Medicare Other | Admitting: Neurology

## 2018-11-27 ENCOUNTER — Telehealth: Payer: Self-pay | Admitting: *Deleted

## 2018-11-27 ENCOUNTER — Other Ambulatory Visit: Payer: Self-pay

## 2018-11-27 VITALS — BP 127/70 | HR 70 | Temp 96.9°F | Ht 64.0 in | Wt 161.8 lb

## 2018-11-27 DIAGNOSIS — F0390 Unspecified dementia without behavioral disturbance: Secondary | ICD-10-CM

## 2018-11-27 NOTE — Telephone Encounter (Signed)
Advised patient, verbalized understanding  

## 2018-11-27 NOTE — Telephone Encounter (Signed)
-----   Message from Minus Breeding, MD sent at 11/12/2018  1:34 PM EDT ----- Very mild plaque.  No change in therapy.  Call Ms. Bennion with the results and send results to [name], Connye Burkitt, FNP

## 2018-11-27 NOTE — Patient Instructions (Signed)
It was wonderful to see you today! You memory score is stable today. Please continue Namenda for memory. Please incorporate exercise, healthy eating, drinking plenty of water, brain stimulating exercises to promote memory such as, reading, playing a game, doing a cross word, etc. I will see you in 6 months. I will call your daughter, [name] to discuss.

## 2018-12-01 ENCOUNTER — Ambulatory Visit (INDEPENDENT_AMBULATORY_CARE_PROVIDER_SITE_OTHER): Payer: Medicare Other | Admitting: Nurse Practitioner

## 2018-12-01 ENCOUNTER — Encounter: Payer: Self-pay | Admitting: Nurse Practitioner

## 2018-12-01 ENCOUNTER — Other Ambulatory Visit: Payer: Self-pay

## 2018-12-01 DIAGNOSIS — H1031 Unspecified acute conjunctivitis, right eye: Secondary | ICD-10-CM

## 2018-12-01 MED ORDER — POLYMYXIN B-TRIMETHOPRIM 10000-0.1 UNIT/ML-% OP SOLN: 2.0000 [drp] | OPHTHALMIC | 0 refills | Status: DC

## 2018-12-01 NOTE — Progress Notes (Signed)
   Virtual Visit via telephone Note Due to COVID-19 pandemic this visit was conducted virtually. This visit type was conducted due to national recommendations for restrictions regarding the COVID-19 Pandemic (e.g. social distancing, sheltering in place) in an effort to limit this patient's exposure and mitigate transmission in our community. All issues noted in this document were discussed and addressed.  A physical exam was not performed with this format.  I connected with [name] on 12/01/18 at 10:50 by telephone and verified that I am speaking with the correct person using two identifiers. [name] is currently located at home and no one is currently with her during visit. The provider, Mary-Margaret Hassell Done, FNP is located in their office at time of visit.  I discussed the limitations, risks, security and privacy concerns of performing an evaluation and management service by telephone and the availability of in person appointments. I also discussed with the patient that there may be a patient responsible charge related to this service. The patient expressed understanding and agreed to proceed.   History and Present Illness:  Patient calls in today stating that right eye is red. She noticed it Saturday night. No drainage. Slight itching and no pain.    Review of Systems  Constitutional: Negative.   HENT: Negative.   Eyes: Positive for redness. Negative for blurred vision, double vision, photophobia, pain and discharge.  Respiratory: Negative.   Cardiovascular: Negative.   Musculoskeletal: Negative.   Skin: Negative.   Neurological: Negative.   Psychiatric/Behavioral: Negative.   All other systems reviewed and are negative.    Observations/Objective: Alert and oriented- answers all questions appropraitely No distress  Assessment and Plan: [name] in today with chief complaint of Conjunctivitis   1. Acute bacterial conjunctivitis of right eye Avoid rubbing  eye Cool compresses Good handwashing when touching face Meds ordered this encounter  Medications  . trimethoprim-polymyxin b (POLYTRIM) ophthalmic solution    Sig: Place 2 drops into both eyes every 4 (four) hours.    Dispense:  10 mL    Refill:  0    Order Specific Question:   Supervising Provider    Answer:   Caryl Pina A [[id number]]      Follow Up Instructions: prn    I discussed the assessment and treatment plan with the patient. The patient was provided an opportunity to ask questions and all were answered. The patient agreed with the plan and demonstrated an understanding of the instructions.   The patient was advised to call back or seek an in-person evaluation if the symptoms worsen or if the condition fails to improve as anticipated.  The above assessment and management plan was discussed with the patient. The patient verbalized understanding of and has agreed to the management plan. Patient is aware to call the clinic if symptoms persist or worsen. Patient is aware when to return to the clinic for a follow-up visit. Patient educated on when it is appropriate to go to the emergency department.   Time call ended:  11:00  I provided 10 minutes of non-face-to-face time during this encounter.    Mary-Margaret Hassell Done, FNP

## 2018-12-03 NOTE — Progress Notes (Signed)
I have reviewed and agreed above plan. 

## 2018-12-05 ENCOUNTER — Telehealth: Payer: Self-pay

## 2018-12-05 ENCOUNTER — Other Ambulatory Visit: Payer: Self-pay | Admitting: *Deleted

## 2018-12-05 DIAGNOSIS — I5032 Chronic diastolic (congestive) heart failure: Secondary | ICD-10-CM

## 2018-12-05 MED ORDER — CARVEDILOL 6.25 MG PO TABS: ORAL_TABLET | ORAL | 2 refills | Status: AC

## 2018-12-10 ENCOUNTER — Ambulatory Visit: Payer: Self-pay | Admitting: Licensed Clinical Social Worker

## 2018-12-10 DIAGNOSIS — F0390 Unspecified dementia without behavioral disturbance: Secondary | ICD-10-CM

## 2018-12-10 DIAGNOSIS — F411 Generalized anxiety disorder: Secondary | ICD-10-CM

## 2018-12-10 DIAGNOSIS — I10 Essential (primary) hypertension: Secondary | ICD-10-CM

## 2018-12-10 DIAGNOSIS — F03A0 Unspecified dementia, mild, without behavioral disturbance, psychotic disturbance, mood disturbance, and anxiety: Secondary | ICD-10-CM

## 2018-12-10 DIAGNOSIS — J449 Chronic obstructive pulmonary disease, unspecified: Secondary | ICD-10-CM

## 2018-12-10 DIAGNOSIS — Z6828 Body mass index (BMI) 28.0-28.9, adult: Secondary | ICD-10-CM

## 2018-12-10 DIAGNOSIS — F339 Major depressive disorder, recurrent, unspecified: Secondary | ICD-10-CM

## 2018-12-10 DIAGNOSIS — R0602 Shortness of breath: Secondary | ICD-10-CM

## 2018-12-10 NOTE — Patient Instructions (Signed)
Licensed Clinical Social Worker Visit Information  Materials provided: No  [name] was given information about Chronic Care Management services today including:  1. CCM service includes personalized support from designated clinical staff supervised by her physician, including individualized plan of care and coordination with other care providers 2. 24/7 contact phone numbers for assistance for urgent and routine care needs. 3. Service will only be billed when office clinical staff spend 20 minutes or more in a month to coordinate care. 4. Only one practitioner may furnish and bill the service in a calendar month. 5. The patient may stop CCM services at any time (effective at the end of the month) by phone call to the office staff. 6. The patient will be responsible for cost sharing (co-pay) of up to 20% of the service fee (after annual deductible is met).  Patient did not agree to services and wishes to consider information provided before deciding about enrollment in care management services.   Follow up plan:  LCSW to call [name] or  daughter, [name], in next 3 weeks to talk with [name] or [name] about CCM program services.   The patient verbalized understanding of instructions provided today and declined a print copy of patient instruction materials.   [name].[name] MSW, LCSW Licensed Clinical Social Worker [facility] [phone]

## 2018-12-10 NOTE — Chronic Care Management (AMB) (Signed)
  Care Management Note   [name] is a 84 y.o. year old female who is a primary care patient of [name], Connye Burkitt, FNP. The CM team was consulted for assistance with chronic disease management and care coordination.   I reached out to [name] by phone today.   [name] was given information about Chronic Care Management services today including:  1. CCM service includes personalized support from designated clinical staff supervised by her physician, including individualized plan of care and coordination with other care providers 2. 24/7 contact phone numbers for assistance for urgent and routine care needs. 3. Service will only be billed when office clinical staff spend 20 minutes or more in a month to coordinate care. 4. Only one practitioner may furnish and bill the service in a calendar month. 5. The patient may stop CCM services at any time (effective at the end of the month) by phone call to the office staff. 6. The patient will be responsible for cost sharing (co-pay) of up to 20% of the service fee (after annual deductible is met). Patient did not agree to services and wishes to consider information provided before deciding about enrollment in care management services.    Review of patient status, including review of consultants reports, relevant laboratory and other test results, and collaboration with appropriate care team members and the patient's provider was performed as part of comprehensive patient evaluation and provision of chronic care management services.   LCSW talked with [name] today via phone. about CCM program support. LCSW had talked to  [name] and her daughter, [name], previously about CCM program support. [name] said she wished to talk further with [name], her daughter about CCM program support   Follow Up Plan: LCSW to call [name] or daughter, [name] , in next 3 weeks to talk with [name] or [name] about CCM program support.    [name].[name] MSW, LCSW Licensed Clinical Social Worker [facility] [phone]

## 2018-12-13 ENCOUNTER — Other Ambulatory Visit: Payer: Self-pay

## 2018-12-13 ENCOUNTER — Emergency Department (HOSPITAL_COMMUNITY): Payer: Medicare Other

## 2018-12-13 ENCOUNTER — Encounter (HOSPITAL_COMMUNITY): Payer: Self-pay | Admitting: *Deleted

## 2018-12-13 ENCOUNTER — Inpatient Hospital Stay (HOSPITAL_COMMUNITY)
Admission: EM | Admit: 2018-12-13 | Discharge: 2018-12-18 | DRG: 190 | Disposition: A | Discharge status: Home Health | Payer: Medicare Other | Attending: Internal Medicine | Admitting: Internal Medicine

## 2018-12-13 DIAGNOSIS — Z7982 Long term (current) use of aspirin: Secondary | ICD-10-CM | POA: Diagnosis not present

## 2018-12-13 DIAGNOSIS — I130 Hypertensive heart and chronic kidney disease with heart failure and stage 1 through stage 4 chronic kidney disease, or unspecified chronic kidney disease: Secondary | ICD-10-CM | POA: Diagnosis present

## 2018-12-13 DIAGNOSIS — Z20828 Contact with and (suspected) exposure to other viral communicable diseases: Secondary | ICD-10-CM | POA: Diagnosis present

## 2018-12-13 DIAGNOSIS — K219 Gastro-esophageal reflux disease without esophagitis: Secondary | ICD-10-CM | POA: Diagnosis present

## 2018-12-13 DIAGNOSIS — F329 Major depressive disorder, single episode, unspecified: Secondary | ICD-10-CM | POA: Diagnosis present

## 2018-12-13 DIAGNOSIS — I10 Essential (primary) hypertension: Secondary | ICD-10-CM | POA: Diagnosis not present

## 2018-12-13 DIAGNOSIS — J9602 Acute respiratory failure with hypercapnia: Secondary | ICD-10-CM | POA: Diagnosis present

## 2018-12-13 DIAGNOSIS — F17210 Nicotine dependence, cigarettes, uncomplicated: Secondary | ICD-10-CM | POA: Diagnosis present

## 2018-12-13 DIAGNOSIS — I5042 Chronic combined systolic (congestive) and diastolic (congestive) heart failure: Secondary | ICD-10-CM | POA: Diagnosis present

## 2018-12-13 DIAGNOSIS — J441 Chronic obstructive pulmonary disease with (acute) exacerbation: Secondary | ICD-10-CM | POA: Diagnosis not present

## 2018-12-13 DIAGNOSIS — Z96641 Presence of right artificial hip joint: Secondary | ICD-10-CM | POA: Diagnosis present

## 2018-12-13 DIAGNOSIS — Z96611 Presence of right artificial shoulder joint: Secondary | ICD-10-CM | POA: Diagnosis present

## 2018-12-13 DIAGNOSIS — E785 Hyperlipidemia, unspecified: Secondary | ICD-10-CM | POA: Diagnosis present

## 2018-12-13 DIAGNOSIS — I5032 Chronic diastolic (congestive) heart failure: Secondary | ICD-10-CM | POA: Diagnosis not present

## 2018-12-13 DIAGNOSIS — Z79899 Other long term (current) drug therapy: Secondary | ICD-10-CM | POA: Diagnosis not present

## 2018-12-13 DIAGNOSIS — N183 Chronic kidney disease, stage 3 (moderate): Secondary | ICD-10-CM | POA: Diagnosis present

## 2018-12-13 DIAGNOSIS — Z888 Allergy status to other drugs, medicaments and biological substances status: Secondary | ICD-10-CM

## 2018-12-13 DIAGNOSIS — E782 Mixed hyperlipidemia: Secondary | ICD-10-CM | POA: Diagnosis present

## 2018-12-13 DIAGNOSIS — Z9049 Acquired absence of other specified parts of digestive tract: Secondary | ICD-10-CM | POA: Diagnosis not present

## 2018-12-13 DIAGNOSIS — J9601 Acute respiratory failure with hypoxia: Secondary | ICD-10-CM | POA: Diagnosis present

## 2018-12-13 DIAGNOSIS — Z800 Family history of malignant neoplasm of digestive organs: Secondary | ICD-10-CM

## 2018-12-13 DIAGNOSIS — I447 Left bundle-branch block, unspecified: Secondary | ICD-10-CM | POA: Diagnosis present

## 2018-12-13 DIAGNOSIS — Z8249 Family history of ischemic heart disease and other diseases of the circulatory system: Secondary | ICD-10-CM

## 2018-12-13 DIAGNOSIS — Z880 Allergy status to penicillin: Secondary | ICD-10-CM | POA: Diagnosis not present

## 2018-12-13 DIAGNOSIS — I429 Cardiomyopathy, unspecified: Secondary | ICD-10-CM | POA: Diagnosis present

## 2018-12-13 DIAGNOSIS — Z833 Family history of diabetes mellitus: Secondary | ICD-10-CM

## 2018-12-13 DIAGNOSIS — Z9114 Patient's other noncompliance with medication regimen: Secondary | ICD-10-CM

## 2018-12-13 DIAGNOSIS — Z885 Allergy status to narcotic agent status: Secondary | ICD-10-CM

## 2018-12-13 DIAGNOSIS — M8580 Other specified disorders of bone density and structure, unspecified site: Secondary | ICD-10-CM | POA: Diagnosis present

## 2018-12-13 DIAGNOSIS — I5022 Chronic systolic (congestive) heart failure: Secondary | ICD-10-CM | POA: Diagnosis present

## 2018-12-13 DIAGNOSIS — J449 Chronic obstructive pulmonary disease, unspecified: Secondary | ICD-10-CM

## 2018-12-13 DIAGNOSIS — R0602 Shortness of breath: Secondary | ICD-10-CM | POA: Diagnosis not present

## 2018-12-13 LAB — CBC WITH DIFFERENTIAL/PLATELET
Abs Immature Granulocytes: 0.01 10*3/uL (ref 0.00–0.07)
Basophils Absolute: 0.1 10*3/uL (ref 0.0–0.1)
Basophils Relative: 1 %
Eosinophils Absolute: 0.6 10*3/uL — ABNORMAL HIGH (ref 0.0–0.5)
Eosinophils Relative: 9 %
HCT: 42.9 % (ref 36.0–46.0)
Hemoglobin: 13.8 g/dL (ref 12.0–15.0)
Immature Granulocytes: 0 %
Lymphocytes Relative: 27 %
Lymphs Abs: 1.8 10*3/uL (ref 0.7–4.0)
MCH: 29.9 pg (ref 26.0–34.0)
MCHC: 32.2 g/dL (ref 30.0–36.0)
MCV: 92.9 fL (ref 80.0–100.0)
Monocytes Absolute: 0.3 10*3/uL (ref 0.1–1.0)
Monocytes Relative: 5 %
Neutro Abs: 3.7 10*3/uL (ref 1.7–7.7)
Neutrophils Relative %: 58 %
Platelets: 186 10*3/uL (ref 150–400)
RBC: 4.62 MIL/uL (ref 3.87–5.11)
RDW: 15 % (ref 11.5–15.5)
WBC: 6.4 10*3/uL (ref 4.0–10.5)
nRBC: 0 % (ref 0.0–0.2)

## 2018-12-13 LAB — BASIC METABOLIC PANEL
Anion gap: 10 (ref 5–15)
BUN: 11 mg/dL (ref 8–23)
CO2: 25 mmol/L (ref 22–32)
Calcium: 9 mg/dL (ref 8.9–10.3)
Chloride: 108 mmol/L (ref 98–111)
Creatinine, Ser: 1.22 mg/dL — ABNORMAL HIGH (ref 0.44–1.00)
GFR calc Af Amer: 48 mL/min — ABNORMAL LOW (ref >60)
GFR calc non Af Amer: 42 mL/min — ABNORMAL LOW (ref >60)
Glucose, Bld: 103 mg/dL — ABNORMAL HIGH (ref 70–99)
Potassium: 3.9 mmol/L (ref 3.5–5.1)
Sodium: 143 mmol/L (ref 135–145)

## 2018-12-13 LAB — SARS CORONAVIRUS 2 BY RT PCR (HOSPITAL ORDER, PERFORMED IN ~~LOC~~ HOSPITAL LAB): SARS Coronavirus 2: NEGATIVE

## 2018-12-13 MED ORDER — POTASSIUM CHLORIDE CRYS ER 20 MEQ PO TBCR
20.0000 meq | EXTENDED_RELEASE_TABLET | Freq: Every day | ORAL | Status: DC
  Administered 2018-12-13 – 2018-12-18 (×6): 20 meq via ORAL
  Filled 2018-12-13 (×6): qty 1

## 2018-12-13 MED ORDER — GABAPENTIN 100 MG PO CAPS
100.0000 mg | ORAL_CAPSULE | Freq: Three times a day (TID) | ORAL | Status: DC
  Administered 2018-12-13 – 2018-12-18 (×14): 100 mg via ORAL
  Filled 2018-12-13 (×14): qty 1

## 2018-12-13 MED ORDER — METHYLPREDNISOLONE SODIUM SUCC 125 MG IJ SOLR
60.0000 mg | Freq: Four times a day (QID) | INTRAMUSCULAR | Status: DC
  Administered 2018-12-13 – 2018-12-16 (×11): 60 mg via INTRAVENOUS
  Filled 2018-12-13 (×11): qty 2

## 2018-12-13 MED ORDER — FUROSEMIDE 20 MG PO TABS
20.0000 mg | ORAL_TABLET | Freq: Every day | ORAL | Status: DC
  Administered 2018-12-14 – 2018-12-18 (×5): 20 mg via ORAL
  Filled 2018-12-13 (×5): qty 1

## 2018-12-13 MED ORDER — RALOXIFENE HCL 60 MG PO TABS
60.0000 mg | ORAL_TABLET | Freq: Every day | ORAL | Status: DC
  Administered 2018-12-14 – 2018-12-18 (×5): 60 mg via ORAL
  Filled 2018-12-13 (×6): qty 1

## 2018-12-13 MED ORDER — CARVEDILOL 6.25 MG PO TABS
6.2500 mg | ORAL_TABLET | Freq: Two times a day (BID) | ORAL | Status: DC
  Administered 2018-12-14 – 2018-12-18 (×9): 6.25 mg via ORAL
  Filled 2018-12-13 (×9): qty 1

## 2018-12-13 MED ORDER — LORATADINE 10 MG PO TABS
10.0000 mg | ORAL_TABLET | Freq: Every day | ORAL | Status: DC
  Administered 2018-12-14 – 2018-12-18 (×5): 10 mg via ORAL
  Filled 2018-12-13 (×5): qty 1

## 2018-12-13 MED ORDER — ALBUTEROL SULFATE (2.5 MG/3ML) 0.083% IN NEBU
2.5000 mg | INHALATION_SOLUTION | RESPIRATORY_TRACT | Status: DC | PRN
  Administered 2018-12-14: 2.5 mg via RESPIRATORY_TRACT
  Filled 2018-12-13: qty 3

## 2018-12-13 MED ORDER — LOSARTAN POTASSIUM 25 MG PO TABS
25.0000 mg | ORAL_TABLET | Freq: Every day | ORAL | Status: DC
  Administered 2018-12-14 – 2018-12-18 (×5): 25 mg via ORAL
  Filled 2018-12-13 (×5): qty 1

## 2018-12-13 MED ORDER — BIOTIN 5 MG PO TABS: 5.0000 mg | ORAL_TABLET | Freq: Every morning | ORAL | Status: DC

## 2018-12-13 MED ORDER — SODIUM CHLORIDE 0.9% FLUSH
3.0000 mL | Freq: Two times a day (BID) | INTRAVENOUS | Status: DC
  Administered 2018-12-13 – 2018-12-17 (×9): 3 mL via INTRAVENOUS

## 2018-12-13 MED ORDER — IPRATROPIUM-ALBUTEROL 0.5-2.5 (3) MG/3ML IN SOLN
RESPIRATORY_TRACT | Status: AC
  Administered 2018-12-13: 3 mL via RESPIRATORY_TRACT
  Filled 2018-12-13: qty 3

## 2018-12-13 MED ORDER — ALBUTEROL SULFATE (2.5 MG/3ML) 0.083% IN NEBU
5.0000 mg | INHALATION_SOLUTION | Freq: Once | RESPIRATORY_TRACT | Status: AC
  Administered 2018-12-13: 5 mg via RESPIRATORY_TRACT
  Filled 2018-12-13: qty 6

## 2018-12-13 MED ORDER — BUPROPION HCL ER (SR) 150 MG PO TB12
150.0000 mg | ORAL_TABLET | Freq: Two times a day (BID) | ORAL | Status: DC
  Administered 2018-12-13 – 2018-12-18 (×10): 150 mg via ORAL
  Filled 2018-12-13 (×11): qty 1

## 2018-12-13 MED ORDER — PANTOPRAZOLE SODIUM 40 MG PO TBEC
40.0000 mg | DELAYED_RELEASE_TABLET | Freq: Every day | ORAL | Status: DC
  Administered 2018-12-14 – 2018-12-18 (×5): 40 mg via ORAL
  Filled 2018-12-13 (×5): qty 1

## 2018-12-13 MED ORDER — PRAVASTATIN SODIUM 40 MG PO TABS
80.0000 mg | ORAL_TABLET | Freq: Every day | ORAL | Status: DC
  Administered 2018-12-13 – 2018-12-18 (×6): 80 mg via ORAL
  Filled 2018-12-13 (×6): qty 2

## 2018-12-13 MED ORDER — IPRATROPIUM BROMIDE 0.02 % IN SOLN: 0.5000 mg | Freq: Four times a day (QID) | RESPIRATORY_TRACT | Status: DC

## 2018-12-13 MED ORDER — CENTRUM SILVER PO TABS: ORAL_TABLET | Freq: Every morning | ORAL | Status: DC

## 2018-12-13 MED ORDER — ENOXAPARIN SODIUM 30 MG/0.3ML ~~LOC~~ SOLN
30.0000 mg | SUBCUTANEOUS | Status: DC
  Administered 2018-12-13: 30 mg via SUBCUTANEOUS
  Filled 2018-12-13: qty 0.3

## 2018-12-13 MED ORDER — IPRATROPIUM-ALBUTEROL 0.5-2.5 (3) MG/3ML IN SOLN
3.0000 mL | Freq: Three times a day (TID) | RESPIRATORY_TRACT | Status: DC
  Administered 2018-12-13 – 2018-12-15 (×5): 3 mL via RESPIRATORY_TRACT
  Filled 2018-12-13 (×4): qty 3

## 2018-12-13 MED ORDER — SODIUM CHLORIDE 0.9 % IV SOLN: 250.0000 mL | INTRAVENOUS | Status: DC | PRN

## 2018-12-13 MED ORDER — MEMANTINE HCL 10 MG PO TABS
10.0000 mg | ORAL_TABLET | Freq: Two times a day (BID) | ORAL | Status: DC
  Administered 2018-12-13 – 2018-12-18 (×10): 10 mg via ORAL
  Filled 2018-12-13 (×12): qty 1

## 2018-12-13 MED ORDER — ASPIRIN EC 81 MG PO TBEC
81.0000 mg | DELAYED_RELEASE_TABLET | Freq: Every evening | ORAL | Status: DC
  Administered 2018-12-13 – 2018-12-17 (×5): 81 mg via ORAL
  Filled 2018-12-13 (×5): qty 1

## 2018-12-13 MED ORDER — ALBUTEROL SULFATE (2.5 MG/3ML) 0.083% IN NEBU: 2.5000 mg | INHALATION_SOLUTION | Freq: Four times a day (QID) | RESPIRATORY_TRACT | Status: DC

## 2018-12-13 MED ORDER — SODIUM CHLORIDE 0.9% FLUSH: 3.0000 mL | INTRAVENOUS | Status: DC | PRN

## 2018-12-13 MED ORDER — CEFDINIR 300 MG PO CAPS
300.0000 mg | ORAL_CAPSULE | Freq: Two times a day (BID) | ORAL | Status: DC
  Administered 2018-12-13 – 2018-12-14 (×2): 300 mg via ORAL
  Filled 2018-12-13 (×2): qty 1

## 2018-12-13 MED ORDER — POLYMYXIN B-TRIMETHOPRIM 10000-0.1 UNIT/ML-% OP SOLN
2.0000 [drp] | OPHTHALMIC | Status: DC
  Administered 2018-12-13 – 2018-12-18 (×29): 2 [drp] via OPHTHALMIC
  Filled 2018-12-13: qty 10

## 2018-12-13 MED ORDER — SODIUM CHLORIDE 0.9 % IV SOLN
INTRAVENOUS | Status: DC
  Administered 2018-12-13: 13:00:00 via INTRAVENOUS

## 2018-12-13 NOTE — ED Triage Notes (Signed)
Pt arrived by EMS with complaints of difficultly breathing X2 days. Pt self administered 1 albuterol treatment with no relief. EMS states Pt was 86% RA upon arrival. Gave 3X duoneb treatments, 125mg  solumedrol with some relief. Pt at 99% RA.  Pt has auditory wheezing during triage.

## 2018-12-13 NOTE — H&P (Addendum)
[facility]                                                                                    Patient Demographics  [name], is a 84 y.o. female  CSN: [id number]  MRN: [id number]  DOB - 1934-06-10  Admit Date - 12/13/2018  Outpatient Primary MD for the patient is [name], [name], FNP   With History of -  Past Medical History:  Diagnosis Date  . Allergic rhinitis   . Anxiety   . Arthritis   . Asthma   . CAP (community acquired pneumonia) 11/26/2017  . COPD (chronic obstructive pulmonary disease) (HCC)   . Depression   . Dyspnea    with exertion   . Elevated troponin 04/05/2016  . Essential hypertension, benign   . GERD (gastroesophageal reflux disease)   . Headache    occiptal neuralgia - left   . Heart murmur   . History of blood transfusion    "related to miscarriage"  . Left bundle branch block    in & out of A fib  . Memory loss   . Mixed hyperlipidemia   . Occipital neuralgia of left side   . Osteopenia   . 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 Urinary urgency       Past Surgical History:  Procedure Laterality Date  . APPENDECTOMY    . BACK SURGERY    . CARDIAC CATHETERIZATION  2010   Normal coronaries  . CATARACT EXTRACTION W/PHACO  04/16/2011   Procedure: CATARACT EXTRACTION PHACO AND INTRAOCULAR LENS PLACEMENT (IOC);  Surgeon: Susa Simmonds;  Location: [facility];  Service: Ophthalmology;  Laterality: Left;  CDE=11.35  . CATARACT EXTRACTION W/PHACO  01/28/2012   Procedure: CATARACT EXTRACTION PHACO AND INTRAOCULAR LENS PLACEMENT (IOC);  Surgeon: Susa Simmonds, MD;  Location: [facility];  Service: Ophthalmology;  Laterality: Right;  CDI:8.15  . CYSTO WITH HYDRODISTENSION  05/29/2012   Procedure: CYSTOSCOPY/HYDRODISTENSION;  Surgeon: Martina Sinner, MD;  Location: [facility];  Service: Urology;  Laterality: N/A;  INSTILLATION OF MARCAINE AND PYRIDIUM   . DILATION AND CURETTAGE OF UTERUS   <hysterectomy"  . EYE SURGERY    . HAMMER TOE SURGERY Bilateral 11/22/11   [facility], [name]  . JOINT REPLACEMENT    . LAPAROSCOPIC CHOLECYSTECTOMY  1991  . LUMBAR LAMINECTOMY  2011  . LUMBAR LAMINECTOMY/DECOMPRESSION MICRODISCECTOMY Left 06/06/2012   Procedure: LUMBAR LAMINECTOMY/DECOMPRESSION MICRODISCECTOMY 1 LEVEL;  Surgeon: Maeola Harman, MD;  Location: [facility];  Service: Neurosurgery;  Laterality: Left;  Left Lumbar two-three Laminectomy for resection of synovial cyst  . SHOULDER ARTHROSCOPY Right 02/12/2018   SHOULDER ARTHROSCOPY, BICEPS DEBRIDEMENT, REMOVAL OF LOOSE BODIES  . SHOULDER ARTHROSCOPY Right 02/12/2018   Procedure: RIGHT SHOULDER ARTHROSCOPY, BICEPS DEBRIDEMENT, REMOVAL OF LOOSE BODIES;  Surgeon: Eldred Manges, MD;  Location: [facility];  Service: Orthopedics;  Laterality: Right;  . SHOULDER OPEN ROTATOR CUFF REPAIR Right 2011  . TONSILLECTOMY    . TOTAL HIP ARTHROPLASTY Right 03/13/2013   Procedure: TOTAL HIP ARTHROPLASTY;  Surgeon: Nestor Lewandowsky, MD;  Location: [facility];  Service: Orthopedics;  Laterality:  Right;  Marland Kitchen. TOTAL SHOULDER ARTHROPLASTY Right 07/02/2018   Procedure: RIGHT TOTAL SHOULDER ARTHROPLASTY;  Surgeon: Eldred MangesYates, Mark C, MD;  Location: [facility];  Service: Orthopedics;  Laterality: Right;  Marland Kitchen. VAGINAL HYSTERECTOMY      in for   Chief Complaint  Patient presents with  . Shortness of Breath     HPI  [name]  is a 84 y.o. female, COPD, CAD, hypertension and history of cardiomyopathy with ejection fraction 40-45% presenting with 2 day history of worsening shortness of breath and cough.  Patient denies any chest pains, nausea, vomiting or diarrhea.  No fever or chills.  No headache, dizziness or loss of consciousness. In the emergency room her blood work was unremarkable and chest x-ray was negative for acute changes.    Review of Systems    In addition to the HPI above,  No Fever-chills, No Headache, No changes with Vision or hearing, No problems swallowing food  or Liquids, No Chest pain,  No Abdominal pain, No Nausea or Vommitting, Bowel movements are regular, No Blood in stool or Urine, No dysuria, No new skin rashes or bruises, No new joints pains-aches,  No new weakness, tingling, numbness in any extremity, No recent weight gain or loss, No polyuria, polydypsia or polyphagia, No significant Mental Stressors.  A full 10 point Review of Systems was done, except as stated above, all other Review of Systems were negative.   Social History Social History   Tobacco Use  . Smoking status: Former Smoker    Years: 60.00    Types: Cigarettes    Quit date: 09/12/2018    Years since quitting: 0.2  . Smokeless tobacco: Never Used  Substance Use Topics  . Alcohol use: Not Currently    Comment: 11/27/2017 "drank some in my 6020s"     Family History Family History  Problem Relation Age of Onset  . Diabetes Father   . Diabetes Brother   . Cancer Brother [id number]       colon  . Diabetes Brother   . Diabetes Brother   . Hypertension Sister   . Diabetes Sister   . Diabetes Sister   . Diabetes Other   . Allergies Other   . Diabetes Son   . Colon cancer Neg Hx   . Anesthesia problems Neg Hx   . Hypotension Neg Hx   . Malignant hyperthermia Neg Hx   . Pseudochol deficiency Neg Hx      Prior to Admission medications   Medication Sig Start Date End Date Taking? Authorizing Provider  albuterol (PROVENTIL) (2.5 MG/3ML) 0.083% nebulizer solution Take 2.5 mg by nebulization every 6 (six) hours as needed for wheezing or shortness of breath.  09/16/18   [provider]  albuterol (VENTOLIN HFA) 108 (90 Base) MCG/ACT inhaler Inhale 1-2 puffs into the lungs every 6 (six) hours as needed for wheezing or shortness of breath. 11/04/18   Sonny Mastersakes, Linda M, FNP  aspirin EC 81 MG tablet Take 81 mg by mouth every evening.     [provider]  Biotin 5 MG TABS Take 5 mg by mouth every morning.     [provider]  buPROPion (WELLBUTRIN SR)  150 MG 12 hr tablet TAKE (1) TABLET TWICE A DAY. 10/23/18   Sonny Mastersakes, Linda M, FNP  carvedilol (COREG) 6.25 MG tablet TAKE  (1)  TABLET TWICE A DAY WITH MEALS (BREAKFAST AND SUPPER) 12/05/18   [name], Doralee AlbinoLinda M, FNP  furosemide (LASIX) 20 MG tablet TAKE 1 TABLET  ONCE DAILY 11/04/18   Rollene Rotunda, MD  gabapentin (NEURONTIN) 100 MG capsule Take 1 capsule (100 mg total) by mouth 3 (three) times daily. 09/30/18   Sonny Masters, FNP  loratadine (CLARITIN) 10 MG tablet Take 1 tablet (10 mg total) by mouth daily. 09/16/18   Sonny Masters, FNP  losartan (COZAAR) 50 MG tablet TAKE (1/2) TABLET DAILY. Patient taking differently: Take 25 mg by mouth daily. TAKE (1/2) TABLET DAILY. 09/15/18   Sonny Masters, FNP  memantine (NAMENDA) 10 MG tablet Take 1 tablet (10 mg total) by mouth 2 (two) times daily for 30 days. 09/30/18 11/04/18  Sonny Masters, FNP  Multiple Vitamins-Minerals (CENTRUM SILVER PO) Take 1 tablet by mouth every morning.     [provider]  omeprazole (PRILOSEC) 40 MG capsule Take 1 capsule (40 mg total) by mouth daily. 11/14/18   Remus Loffler, PA-C  ondansetron (ZOFRAN) 4 MG tablet Take 1 tablet (4 mg total) by mouth every 8 (eight) hours as needed for nausea or vomiting. 08/18/18   [name], [name], FNP  potassium chloride SA (K-DUR) 20 MEQ tablet TAKE 1 TABLET DAILY 10/07/18   Rollene Rotunda, MD  pravastatin (PRAVACHOL) 80 MG tablet TAKE 1 TABLET DAILY 11/20/18   Rollene Rotunda, MD  raloxifene (EVISTA) 60 MG tablet Take 1 tablet (60 mg total) by mouth daily. 10/08/18   Sonny Masters, FNP  Spacer/Aero-Holding Chambers (AEROCHAMBER MV) inhaler Use as instructed 12/24/17   Coral Ceo, NP  trimethoprim-polymyxin b (POLYTRIM) ophthalmic solution Place 2 drops into both eyes every 4 (four) hours. 12/01/18   Bennie Pierini, FNP    Allergies  Allergen Reactions  . Lisinopril-Hydrochlorothiazide Swelling, Other (See Comments) and Cough    Lip swelling and cough. Stopped by ENT. Patient is on  Losartan without any issues   . Codeine Itching and Swelling    eye irritation (prescribed percocet)  . Penicillins Itching and Swelling    PATIENT HAS HAD A PCN REACTION WITH IMMEDIATE RASH, FACIAL/TONGUE/THROAT SWELLING, SOB, OR LIGHTHEADEDNESS WITH HYPOTENSION:  #  #  YES  #  #  Has patient had a PCN reaction causing severe rash involving mucus membranes or skin necrosis: No Has patient had a PCN reaction that required hospitalization No Has patient had a PCN reaction occurring within the last 10 years: No eye irritation  . Aricept [Donepezil Hcl] Nausea And Vomiting  . Namenda [Memantine Hcl] Nausea And Vomiting    Physical Exam  Vitals  Blood pressure 123/78, pulse (!) 46, temperature 98.6 F (37 C), temperature source Oral, resp. rate 20, SpO2 96 %.  General appearance well-developed, well-nourished female looks tired and continuously coughing HEENT no jaundice or pallor, no facial deviation or oral thrush Neck supple, no neck vein distention Chest scattered rhonchi and wheezing Heart normal S1-S2, tachycardic Abdomen soft nontender bowel sounds present Extremities no clubbing cyanosis or edema Neuro grossly nonfocal, patient moving all extremities Psychological not homicidal or suicidal  Data Review  CBC Recent Labs  Lab 12/13/18 1247  WBC 6.4  HGB 13.8  HCT 42.9  PLT 186  MCV 92.9  MCH 29.9  MCHC 32.2  RDW 15.0  LYMPHSABS 1.8  MONOABS 0.3  EOSABS 0.6*  BASOSABS 0.1   ------------------------------------------------------------------------------------------------------------------  Chemistries  Recent Labs  Lab 12/13/18 1247  NA 143  K 3.9  CL 108  CO2 25  GLUCOSE 103*  BUN 11  CREATININE 1.22*  CALCIUM 9.0   ------------------------------------------------------------------------------------------------------------------ CrCl cannot be calculated (  Unknown ideal  weight.). ------------------------------------------------------------------------------------------------------------------ No results for input(s): TSH, T4TOTAL, T3FREE, THYROIDAB in the last 72 hours.  Invalid input(s): FREET3   Coagulation profile No results for input(s): INR, PROTIME in the last 168 hours. ------------------------------------------------------------------------------------------------------------------- No results for input(s): DDIMER in the last 72 hours. -------------------------------------------------------------------------------------------------------------------  Cardiac Enzymes No results for input(s): CKMB, TROPONINI, MYOGLOBIN in the last 168 hours.  Invalid input(s): CK ------------------------------------------------------------------------------------------------------------------ Invalid input(s): POCBNP   ---------------------------------------------------------------------------------------------------------------  Urinalysis    Component Value Date/Time   COLORURINE YELLOW 04/05/2016 1654   APPEARANCEUR HAZY (A) 04/05/2016 1654   APPEARANCEUR Clear 10/28/2015 1622   LABSPEC 1.039 (H) 04/05/2016 1654   PHURINE 8.0 04/05/2016 1654   GLUCOSEU NEGATIVE 04/05/2016 1654   HGBUR NEGATIVE 04/05/2016 1654   BILIRUBINUR NEGATIVE 04/05/2016 1654   BILIRUBINUR Negative 10/28/2015 1622   KETONESUR NEGATIVE 04/05/2016 1654   PROTEINUR NEGATIVE 04/05/2016 1654   UROBILINOGEN 0.2 10/04/2013 1457   NITRITE NEGATIVE 04/05/2016 1654   LEUKOCYTESUR NEGATIVE 04/05/2016 1654   LEUKOCYTESUR Trace (A) 10/28/2015 1622    ----------------------------------------------------------------------------------------------------------------   Imaging results:   Dg Chest Port 1 View  Result Date: 12/13/2018 CLINICAL DATA:  84 year old female with shortness of breath for 2 days EXAM: PORTABLE CHEST 1 VIEW COMPARISON:  Prior chest x-ray 10/04/2018 FINDINGS: Stable  cardiomegaly. Atherosclerotic calcifications again noted in the transverse aorta. The lungs are clear save for minimal chronic bronchitic changes. Prior surgical changes of right shoulder arthroplasty. No acute osseous abnormality. IMPRESSION: Stable chest x-ray without evidence of acute cardiopulmonary process. Electronically Signed   By: Jacqulynn Cadet M.D.   On: 12/13/2018 13:14      Assessment & Plan  COPD exacerbation/bronchitis Solu-Medrol/nebulizer treatments Mucinex Cefdinir  Hypertension Continue with Coreg/losartan  History of depression Continue with bupropion  Hypercholesterolemia Continue with Pravachol  Bradycardia Check EKG  DVT Prophylaxis Lovenox  AM Labs Ordered, also please review Full Orders  Family Communication: called tonya daughter.  No answer  Code Status full  Disposition Plan: home  Time spent in minutes :42 min  Condition GUARDED   @SIGNATURE @

## 2018-12-13 NOTE — ED Provider Notes (Signed)
[facility] EMERGENCY DEPARTMENT Provider Note   CSN: [id number] Arrival date & time: 12/13/18  1222     History   Chief Complaint No chief complaint on file.   HPI [name] is a 84 y.o. female.     84 year old female with history of COPD presents with increased shortness of breath x24 hours.  Cough is been nonproductive and unrelieved with her home nebulizer treatments.  No fever or chills.  No chest pain or chest pressure.  No peripheral edema.  Called EMS and patient given albuterol treatments x3 along with Solu-Medrol and transported here.     Past Medical History:  Diagnosis Date  . Allergic rhinitis   . Anxiety   . Arthritis   . Asthma   . CAP (community acquired pneumonia) 11/26/2017  . COPD (chronic obstructive pulmonary disease) (HCC)   . Depression   . Dyspnea    with exertion   . Elevated troponin 04/05/2016  . Essential hypertension, benign   . GERD (gastroesophageal reflux disease)   . Headache    occiptal neuralgia - left   . Heart murmur   . History of blood transfusion    "related to miscarriage"  . Left bundle branch block    in & out of A fib  . Memory loss   . Mixed hyperlipidemia   . Occipital neuralgia of left side   . Osteopenia   . 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 Urinary urgency     Patient Active Problem List   Diagnosis Date Noted  . Prolonged QT interval 10/05/2018  . Elevated troponin 10/05/2018  . Hot flashes due to menopause 09/30/2018  . H/O total shoulder replacement, right 07/17/2018  . Shoulder arthritis 07/02/2018  . Dementia without behavioral disturbance (HCC) 05/29/2018  . SOB (shortness of breath) 04/22/2018  . COPD exacerbation (HCC)   . Occipital neuralgia of left side 03/20/2018  . Glenohumeral arthritis, right 02/12/2018  . Tobacco use 12/24/2017  . Chronic obstructive pulmonary disease (HCC)   . Biceps tendinosis of right shoulder 08/04/2017  .  Pre-operative cardiovascular examination 08/01/2017  . Carotid artery disease (HCC) 07/03/2017  . DOE (dyspnea on exertion) 07/03/2017  . Chronic right shoulder pain 12/27/2016  . Constipation 04/11/2016  . Chronic diastolic heart failure (HCC) 04/07/2016  . Hyperglycemia 04/05/2016  . Syncope 11/15/2015  . Dehydration 10/05/2015  . UTI (lower urinary tract infection) 10/05/2015  . Mild dementia (HCC) 09/13/2015  . Memory problem 05/07/2015  . Neck pain 04/06/2015  . Insomnia 12/10/2014  . BMI 28.0-28.9,adult 12/10/2014  . Osteopenia 05/19/2014  . GAD (generalized anxiety disorder) 01/06/2014  . Depression 01/06/2014  . GERD (gastroesophageal reflux disease) 01/06/2014  . Vitamin D deficiency 01/06/2014  . Overactive bladder 01/06/2014  . Secondary cardiomyopathy (HCC) 10/06/2013  . Left bundle branch block 10/06/2013  . Dizziness 10/04/2013  . Osteoarthritis of right hip 03/14/2013  . Cough 02/01/2011  . Reflux 02/01/2011  . Allergic rhinitis 12/07/2008  . Chronic cough 12/07/2008  . Hepatitis B virus infection 08/16/2008  . Hyperlipidemia 08/16/2008  . Essential hypertension 08/16/2008  . DISC DEGENERATION 07/24/2007    Past Surgical History:  Procedure Laterality Date  . APPENDECTOMY    . BACK SURGERY    . CARDIAC CATHETERIZATION  2010   Normal coronaries  . CATARACT EXTRACTION W/PHACO  04/16/2011   Procedure: CATARACT EXTRACTION PHACO AND INTRAOCULAR LENS PLACEMENT (IOC);  Surgeon: Susa Simmondsarroll F Haines;  Location: [facility];  Service: Ophthalmology;  Laterality: Left;  CDE=11.35  . CATARACT EXTRACTION W/PHACO  01/28/2012   Procedure: CATARACT EXTRACTION PHACO AND INTRAOCULAR LENS PLACEMENT (IOC);  Surgeon: Susa Simmondsarroll F Haines, MD;  Location: [facility];  Service: Ophthalmology;  Laterality: Right;  CDI:8.15  . CYSTO WITH HYDRODISTENSION  05/29/2012   Procedure: CYSTOSCOPY/HYDRODISTENSION;  Surgeon: Martina SinnerScott A MacDiarmid, MD;  [facility];  Service: Urology;   Laterality: N/A;  INSTILLATION OF MARCAINE AND PYRIDIUM   . DILATION AND CURETTAGE OF UTERUS  <hysterectomy"  . EYE SURGERY    . HAMMER TOE SURGERY Bilateral 11/22/11   [facility], Ulice Brilli[facility]rake  . JOINT REPLACEMENT    . LAPAROSCOPIC CHOLECYSTECTOMY  1991  . LUMBAR LAMINECTOMY  2011  . LUMBAR LAMINECTOMY/DECOMPRESSION MICRODISCECTOMY Left 06/06/2012   Procedure: LUMBAR LAMINECTOMY/DECOMPRESSION MICRODISCECTOMY 1 LEVEL;  Surgeon: Maeola HarmanJoseph Stern, MD;  [facility];  Service: Neurosurgery;  Laterality: Left;  Left Lumbar two-three Laminectomy for resection of synovial cyst  . SHOULDER ARTHROSCOPY Right 02/12/2018   SHOULDER ARTHROSCOPY, BICEPS DEBRIDEMENT, REMOVAL OF LOOSE BODIES  . SHOULDER ARTHROSCOPY Right 02/12/2018   Procedure: RIGHT SHOULDER ARTHROSCOPY, BICEPS DEBRIDEMENT, REMOVAL OF LOOSE BODIES;  Surgeon: Eldred MangesYates, Mark C, MD;  Location: [facility];  Service: Orthopedics;  Laterality: Right;  . SHOULDER OPEN ROTATOR CUFF REPAIR Right 2011  . TONSILLECTOMY    . TOTAL HIP ARTHROPLASTY Right 03/13/2013   Procedure: TOTAL HIP ARTHROPLASTY;  Surgeon: Nestor LewandowskyFrank J Rowan, MD;  Location: [facility];  Service: Orthopedics;  Laterality: Right;  . TOTAL SHOULDER ARTHROPLASTY Right 07/02/2018   Procedure: RIGHT TOTAL SHOULDER ARTHROPLASTY;  Surgeon: Eldred MangesYates, Mark C, MD;  Location: [facility];  Service: Orthopedics;  Laterality: Right;  Marland Kitchen. VAGINAL HYSTERECTOMY       OB History    Gravida  5   Para  5   Term  5   Preterm      AB      Living  4     SAB      TAB      Ectopic      Multiple      Live Births               Home Medications    Prior to Admission medications   Medication Sig Start Date End Date Taking? Authorizing Provider  albuterol (PROVENTIL) (2.5 MG/3ML) 0.083% nebulizer solution Take 2.5 mg by nebulization every 6 (six) hours as needed for wheezing or shortness of breath.  09/16/18   [provider]  albuterol (VENTOLIN HFA) 108 (90 Base) MCG/ACT inhaler Inhale 1-2 puffs into the  lungs every 6 (six) hours as needed for wheezing or shortness of breath. 11/04/18   Sonny Mastersakes, Linda M, FNP  aspirin EC 81 MG tablet Take 81 mg by mouth every evening.     [provider]  Biotin 5 MG TABS Take 5 mg by mouth every morning.     [provider]  buPROPion (WELLBUTRIN SR) 150 MG 12 hr tablet TAKE (1) TABLET TWICE A DAY. 10/23/18   Sonny Mastersakes, Linda M, FNP  carvedilol (COREG) 6.25 MG tablet TAKE  (1)  TABLET TWICE A DAY WITH MEALS (BREAKFAST AND SUPPER) 12/05/18   [name], Doralee AlbinoLinda M, FNP  furosemide (LASIX) 20 MG tablet TAKE 1 TABLET ONCE DAILY 11/04/18   Rollene RotundaHochrein, [name], MD  gabapentin (NEURONTIN) 100 MG capsule Take 1 capsule (100 mg total) by mouth 3 (three) times daily. 09/30/18   Sonny Mastersakes, Linda M, FNP  loratadine (CLARITIN) 10 MG tablet Take  1 tablet (10 mg total) by mouth daily. 09/16/18   Baruch Gouty, FNP  losartan (COZAAR) 50 MG tablet TAKE (1/2) TABLET DAILY. Patient taking differently: Take 25 mg by mouth daily. TAKE (1/2) TABLET DAILY. 09/15/18   Baruch Gouty, FNP  memantine (NAMENDA) 10 MG tablet Take 1 tablet (10 mg total) by mouth 2 (two) times daily for 30 days. 09/30/18 11/04/18  Baruch Gouty, FNP  Multiple Vitamins-Minerals (CENTRUM SILVER PO) Take 1 tablet by mouth every morning.     [provider]  omeprazole (PRILOSEC) 40 MG capsule Take 1 capsule (40 mg total) by mouth daily. 11/14/18   Terald Sleeper, PA-C  ondansetron (ZOFRAN) 4 MG tablet Take 1 tablet (4 mg total) by mouth every 8 (eight) hours as needed for nausea or vomiting. 08/18/18   [name], Connye Burkitt, FNP  potassium chloride SA (K-DUR) 20 MEQ tablet TAKE 1 TABLET DAILY 10/07/18   Minus Breeding, MD  pravastatin (PRAVACHOL) 80 MG tablet TAKE 1 TABLET DAILY 11/20/18   Minus Breeding, MD  raloxifene (EVISTA) 60 MG tablet Take 1 tablet (60 mg total) by mouth daily. 10/08/18   Baruch Gouty, FNP  Spacer/Aero-Holding Chambers (AEROCHAMBER MV) inhaler Use as instructed 12/24/17   Lauraine Rinne, NP   trimethoprim-polymyxin b (POLYTRIM) ophthalmic solution Place 2 drops into both eyes every 4 (four) hours. 12/01/18   Chevis Pretty, FNP    Family History Family History  Problem Relation Age of Onset  . Diabetes Father   . Diabetes Brother   . Cancer Brother 52       colon  . Diabetes Brother   . Diabetes Brother   . Hypertension Sister   . Diabetes Sister   . Diabetes Sister   . Diabetes Other   . Allergies Other   . Diabetes Son   . Colon cancer Neg Hx   . Anesthesia problems Neg Hx   . Hypotension Neg Hx   . Malignant hyperthermia Neg Hx   . Pseudochol deficiency Neg Hx     Social History Social History   Tobacco Use  . Smoking status: Former Smoker    Years: 60.00    Types: Cigarettes    Quit date: 09/12/2018    Years since quitting: 0.2  . Smokeless tobacco: Never Used  Substance Use Topics  . Alcohol use: Not Currently    Comment: 11/27/2017 "drank some in my 20s"  . Drug use: No     Allergies   Lisinopril-hydrochlorothiazide, Codeine, Penicillins, Aricept [donepezil hcl], and Namenda [memantine hcl]   Review of Systems Review of Systems  All other systems reviewed and are negative.    Physical Exam Updated Vital Signs There were no vitals taken for this visit.  Physical Exam Vitals signs and nursing note reviewed.  Constitutional:      General: She is not in acute distress.    Appearance: Normal appearance. She is well-developed. She is not toxic-appearing.  HENT:     Head: Normocephalic and atraumatic.  Eyes:     General: Lids are normal.     Conjunctiva/sclera: Conjunctivae normal.     Pupils: Pupils are equal, round, and reactive to light.  Neck:     Musculoskeletal: Normal range of motion and neck supple.     Thyroid: No thyroid mass.     Trachea: No tracheal deviation.  Cardiovascular:     Rate and Rhythm: Normal rate and regular rhythm.     Heart sounds: Normal heart sounds. No  murmur. No gallop.   Pulmonary:     Effort:  Pulmonary effort is normal. No respiratory distress.     Breath sounds: No stridor. Examination of the right-upper field reveals decreased breath sounds and wheezing. Examination of the left-upper field reveals decreased breath sounds and wheezing. Decreased breath sounds and wheezing present. No rhonchi or rales.  Abdominal:     General: Bowel sounds are normal. There is no distension.     Palpations: Abdomen is soft.     Tenderness: There is no abdominal tenderness. There is no rebound.  Musculoskeletal: Normal range of motion.        General: No tenderness.  Skin:    General: Skin is warm and dry.     Findings: No abrasion or rash.  Neurological:     Mental Status: She is alert and oriented to person, place, and time.     GCS: GCS eye subscore is 4. GCS verbal subscore is 5. GCS motor subscore is 6.     Cranial Nerves: No cranial nerve deficit.     Sensory: No sensory deficit.  Psychiatric:        Speech: Speech normal.        Behavior: Behavior normal.      ED Treatments / Results  Labs (all labs ordered are listed, but only abnormal results are displayed) Labs Reviewed  CBC WITH DIFFERENTIAL/PLATELET  BASIC METABOLIC PANEL    EKG None  Radiology No results found.  Procedures Procedures (including critical care time)  Medications Ordered in ED Medications  0.9 %  sodium chloride infusion (has no administration in time range)     Initial Impression / Assessment and Plan / ED Course  I have reviewed the triage vital signs and the nursing notes.  Pertinent labs & imaging results that were available during my care of the patient were reviewed by me and considered in my medical decision making (see chart for details).        Patient received Solu-Medrol prior to arrival.  Given albuterol 10 mg aerosol with some improvement.  Wheezing remains.  Will be admitted to the hospitalist service  Final Clinical Impressions(s) / ED Diagnoses   Final diagnoses:  None     ED Discharge Orders    None       Lorre Nick, MD 12/13/18 1737

## 2018-12-13 NOTE — ED Notes (Signed)
Attempted to call report.  Nurse unavailable. 

## 2018-12-13 NOTE — ED Notes (Signed)
Please call pt's daughter Halina Andreas at [phone] to give her an update on pt.

## 2018-12-13 NOTE — ED Notes (Signed)
ED TO INPATIENT HANDOFF REPORT  ED Nurse Name and Phone #: Harriette Bouillon [phone]  S Name/Age/Gender [name] 84 y.o. female Room/Bed: 044C/044C  Code Status   Code Status: Prior  Home/SNF/Other Home Patient oriented to: self, place, time and situation Is this baseline? Yes   Triage Complete: Triage complete  Chief Complaint difficulty breathing  Triage Note Pt arrived by EMS with complaints of difficultly breathing X2 days. Pt self administered 1 albuterol treatment with no relief. EMS states Pt was 86% RA upon arrival. Gave 3X duoneb treatments, 125mg  solumedrol with some relief. Pt at 99% RA.  Pt has auditory wheezing during triage.     Allergies Allergies  Allergen Reactions  . Lisinopril-Hydrochlorothiazide Swelling, Other (See Comments) and Cough    Lip swelling and cough. Stopped by ENT. Patient is on Losartan without any issues   . Codeine Itching and Swelling    eye irritation (prescribed percocet)  . Penicillins Itching and Swelling    PATIENT HAS HAD A PCN REACTION WITH IMMEDIATE RASH, FACIAL/TONGUE/THROAT SWELLING, SOB, OR LIGHTHEADEDNESS WITH HYPOTENSION:  yes Has patient had a PCN reaction causing severe rash involving mucus membranes or skin necrosis: No Has patient had a PCN reaction that required hospitalization No Has patient had a PCN reaction occurring within the last 10 years: No eye irritation  . Aricept [Donepezil Hcl] Nausea And Vomiting    Level of Care/Admitting Diagnosis ED Disposition    ED Disposition Condition Comment   Admit  Hospital Area: [facility] [[id number]]  Level of Care: Med-Surg [16]  Covid Evaluation: Asymptomatic Screening Protocol (No Symptoms)  Diagnosis: COPD exacerbation ([place]) [[id number]]  Admitting Physician: Merton Border Marshal.Browner  Attending Physician: HIJAZI, ALI Marshal.Browner  Estimated length of stay: past midnight tomorrow  Certification:: I certify this patient will need inpatient services for at least 2  midnights  PT Class (Do Not Modify): Inpatient [101]  PT Acc Code (Do Not Modify): Private [1]       B Medical/Surgery History Past Medical History:  Diagnosis Date  . Allergic rhinitis   . Anxiety   . Arthritis   . Asthma   . CAP (community acquired pneumonia) 11/26/2017  . COPD (chronic obstructive pulmonary disease) ([place])   . Depression   . Dyspnea    with exertion   . Elevated troponin 04/05/2016  . Essential hypertension, benign   . GERD (gastroesophageal reflux disease)   . Headache    occiptal neuralgia - left   . Heart murmur   . History of blood transfusion    "related to miscarriage"  . Left bundle branch block    in & out of A fib  . Memory loss   . Mixed hyperlipidemia   . Occipital neuralgia of left side   . Osteopenia   . 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 Urinary urgency    Past Surgical History:  Procedure Laterality Date  . APPENDECTOMY    . BACK SURGERY    . CARDIAC CATHETERIZATION  2010   Normal coronaries  . CATARACT EXTRACTION W/PHACO  04/16/2011   Procedure: CATARACT EXTRACTION PHACO AND INTRAOCULAR LENS PLACEMENT (IOC);  Surgeon: Williams Che;  Location: [facility];  Service: Ophthalmology;  Laterality: Left;  CDE=11.35  . CATARACT EXTRACTION W/PHACO  01/28/2012   Procedure: CATARACT EXTRACTION PHACO AND INTRAOCULAR LENS PLACEMENT (IOC);  Surgeon: Williams Che, MD;  Location: [facility];  Service: Ophthalmology;  Laterality: Right;  CDI:8.15  .  CYSTO WITH HYDRODISTENSION  05/29/2012   Procedure: CYSTOSCOPY/HYDRODISTENSION;  Surgeon: Martina Sinner, MD;  [facility];  Service: Urology;  Laterality: N/A;  INSTILLATION OF MARCAINE AND PYRIDIUM   . DILATION AND CURETTAGE OF UTERUS  <hysterectomy"  . EYE SURGERY    . HAMMER TOE SURGERY Bilateral 11/22/11   [facility], [name]  . JOINT REPLACEMENT    . LAPAROSCOPIC CHOLECYSTECTOMY  1991  . LUMBAR LAMINECTOMY  2011  . LUMBAR  LAMINECTOMY/DECOMPRESSION MICRODISCECTOMY Left 06/06/2012   Procedure: LUMBAR LAMINECTOMY/DECOMPRESSION MICRODISCECTOMY 1 LEVEL;  Surgeon: Maeola Harman, MD;  [facility];  Service: Neurosurgery;  Laterality: Left;  Left Lumbar two-three Laminectomy for resection of synovial cyst  . SHOULDER ARTHROSCOPY Right 02/12/2018   SHOULDER ARTHROSCOPY, BICEPS DEBRIDEMENT, REMOVAL OF LOOSE BODIES  . SHOULDER ARTHROSCOPY Right 02/12/2018   Procedure: RIGHT SHOULDER ARTHROSCOPY, BICEPS DEBRIDEMENT, REMOVAL OF LOOSE BODIES;  Surgeon: Eldred Manges, MD;  Location: [facility];  Service: Orthopedics;  Laterality: Right;  . SHOULDER OPEN ROTATOR CUFF REPAIR Right 2011  . TONSILLECTOMY    . TOTAL HIP ARTHROPLASTY Right 03/13/2013   Procedure: TOTAL HIP ARTHROPLASTY;  Surgeon: Nestor Lewandowsky, MD;  Location: [facility];  Service: Orthopedics;  Laterality: Right;  . TOTAL SHOULDER ARTHROPLASTY Right 07/02/2018   Procedure: RIGHT TOTAL SHOULDER ARTHROPLASTY;  Surgeon: Eldred Manges, MD;  Location[facility];  Service: Orthopedics;  Laterality: Right;  Marland Kitchen VAGINAL HYSTERECTOMY       A IV Location/Drains/Wounds Patient Lines/Drains/Airways Status   Active Line/Drains/Airways    Name:   Placement date:   Placement time:   Site:   Days:   Peripheral IV 12/13/18 Anterior;Right Forearm   12/13/18    1244    Forearm   less than 1   External Urinary Catheter   12/13/18    1733    -   less than 1   Incision (Closed) 07/02/18 Shoulder Right   07/02/18    1524     164          Intake/Output Last 24 hours  Intake/Output Summary (Last 24 hours) at 12/13/2018 1836 Last data filed at 12/13/2018 1719 Gross per 24 hour  Intake 1000 ml  Output -  Net 1000 ml    Labs/Imaging Results for orders placed or performed during the hospital encounter of 12/13/18 (from the past 48 hour(s))  CBC with Differential/Platelet     Status: Abnormal   Collection Time: 12/13/18 12:47 PM  Result Value Ref Range   WBC 6.4 4.0 - 10.5 K/uL   RBC  4.62 3.87 - 5.11 MIL/uL   Hemoglobin 13.8 12.0 - 15.0 g/dL   HCT 54.6 50.3 - 54.6 %   MCV 92.9 80.0 - 100.0 fL   MCH 29.9 26.0 - 34.0 pg   MCHC 32.2 30.0 - 36.0 g/dL   RDW 56.8 12.7 - 51.7 %   Platelets 186 150 - 400 K/uL   nRBC 0.0 0.0 - 0.2 %   Neutrophils Relative % 58 %   Neutro Abs 3.7 1.7 - 7.7 K/uL   Lymphocytes Relative 27 %   Lymphs Abs 1.8 0.7 - 4.0 K/uL   Monocytes Relative 5 %   Monocytes Absolute 0.3 0.1 - 1.0 K/uL   Eosinophils Relative 9 %   Eosinophils Absolute 0.6 (H) 0.0 - 0.5 K/uL   Basophils Relative 1 %   Basophils Absolute 0.1 0.0 - 0.1 K/uL   Immature Granulocytes 0 %   Abs Immature Granulocytes 0.01  0.00 - 0.07 K/uL    Comment: Performed at [facility], [address], [place], [place] [address]  Basic metabolic panel     Status: Abnormal   Collection Time: 12/13/18 12:47 PM  Result Value Ref Range   Sodium 143 135 - 145 mmol/L   Potassium 3.9 3.5 - 5.1 mmol/L   Chloride 108 98 - 111 mmol/L   CO2 25 22 - 32 mmol/L   Glucose, Bld 103 (H) 70 - 99 mg/dL   BUN 11 8 - 23 mg/dL   Creatinine, Ser 0.10 (H) 0.44 - 1.00 mg/dL   Calcium 9.0 8.9 - 93.2 mg/dL   GFR calc non Af Amer 42 (L) >60 mL/min   GFR calc Af Amer 48 (L) >60 mL/min   Anion gap 10 5 - 15    Comment: Performed at [facility], [address] [place], [place] [address]  SARS Coronavirus 2 [facility] order, Performed in [facility] hospital lab) Nasopharyngeal Nasopharyngeal Swab     Status: None   Collection Time: 12/13/18  3:08 PM   Specimen: Nasopharyngeal Swab  Result Value Ref Range   SARS Coronavirus 2 NEGATIVE NEGATIVE    Comment: (NOTE) If result is NEGATIVE SARS-CoV-2 target nucleic acids are NOT DETECTED. The SARS-CoV-2 RNA is generally detectable in upper and lower  respiratory specimens during the acute phase of infection. The lowest  concentration of SARS-CoV-2 viral copies this assay can detect is 250  copies / mL. A negative result does not preclude SARS-CoV-2  infection  and should not be used as the sole basis for treatment or other  patient management decisions.  A negative result may occur with  improper specimen collection / handling, submission of specimen other  than nasopharyngeal swab, presence of viral mutation(s) within the  areas targeted by this assay, and inadequate number of viral copies  (<250 copies / mL). A negative result must be combined with clinical  observations, patient history, and epidemiological information. If result is POSITIVE SARS-CoV-2 target nucleic acids are DETECTED. The SARS-CoV-2 RNA is generally detectable in upper and lower  respiratory specimens dur ing the acute phase of infection.  Positive  results are indicative of active infection with SARS-CoV-2.  Clinical  correlation with patient history and other diagnostic information is  necessary to determine patient infection status.  Positive results do  not rule out bacterial infection or co-infection with other viruses. If result is PRESUMPTIVE POSTIVE SARS-CoV-2 nucleic acids MAY BE PRESENT.   A presumptive positive result was obtained on the submitted specimen  and confirmed on repeat testing.  While 2019 novel coronavirus  (SARS-CoV-2) nucleic acids may be present in the submitted sample  additional confirmatory testing may be necessary for epidemiological  and / or clinical management purposes  to differentiate between  SARS-CoV-2 and other Sarbecovirus currently known to infect humans.  If clinically indicated additional testing with an alternate test  methodology [phone]) is advised. The SARS-CoV-2 RNA is generally  detectable in upper and lower respiratory sp ecimens during the acute  phase of infection. The expected result is Negative. Fact Sheet for Patients:  BoilerBrush.com.cy Fact Sheet for Healthcare Providers: https://pope.com/ This test is not yet approved or cleared by the [place]  FDA and has been authorized for detection and/or diagnosis of SARS-CoV-2 by FDA under an Emergency Use Authorization (EUA).  This EUA will remain in effect (meaning this test can be used) for the duration of the COVID-19 declaration under Section 564(b)(1)  of the Act, 21 U.S.C. section 360bbb-3(b)(1), unless the authorization is terminated or revoked sooner. Performed at [facility], [address] [place], [place] [address]    Dg Chest Port 1 View  Result Date: 12/13/2018 CLINICAL DATA:  84 year old female with shortness of breath for 2 days EXAM: PORTABLE CHEST 1 VIEW COMPARISON:  Prior chest x-ray 10/04/2018 FINDINGS: Stable cardiomegaly. Atherosclerotic calcifications again noted in the transverse aorta. The lungs are clear save for minimal chronic bronchitic changes. Prior surgical changes of right shoulder arthroplasty. No acute osseous abnormality. IMPRESSION: Stable chest x-ray without evidence of acute cardiopulmonary process. Electronically Signed   By: Malachy MoanHeath  McCullough M.D.   On: 12/13/2018 13:14    Pending Labs [facility] (From admission, onward)    Start     Ordered   Signed and Held  Basic metabolic panel  Tomorrow morning,   R     Signed and Held          Vitals/Pain Today's Vitals   12/13/18 1242 12/13/18 1245 12/13/18 1300 12/13/18 1500  BP: (!) 150/86 114/82 132/82 123/78  Pulse: 77 (!) 142 77 (!) 46  Resp: 20  16 20   Temp: 98.6 F (37 C)     TempSrc: Oral     SpO2: 93% 99% 100% 96%    Isolation Precautions No active isolations  Medications Medications  0.9 %  sodium chloride infusion ( Intravenous Stopped 12/13/18 1719)  albuterol (PROVENTIL) (2.5 MG/3ML) 0.083% nebulizer solution 5 mg (5 mg Nebulization Given 12/13/18 1357)    Mobility walks Low fall risk   Focused Assessments Pulmonary Assessment Handoff:  Lung sounds: L Breath Sounds: Expiratory wheezes R Breath Sounds: Expiratory wheezes O2 Device: Room Air         R Recommendations: See Admitting Provider Note  Report given to:   Additional Notes:

## 2018-12-14 DIAGNOSIS — J9601 Acute respiratory failure with hypoxia: Secondary | ICD-10-CM

## 2018-12-14 DIAGNOSIS — J9602 Acute respiratory failure with hypercapnia: Secondary | ICD-10-CM

## 2018-12-14 LAB — BASIC METABOLIC PANEL
Anion gap: 10 (ref 5–15)
BUN: 16 mg/dL (ref 8–23)
CO2: 20 mmol/L — ABNORMAL LOW (ref 22–32)
Calcium: 8.9 mg/dL (ref 8.9–10.3)
Chloride: 107 mmol/L (ref 98–111)
Creatinine, Ser: 1.14 mg/dL — ABNORMAL HIGH (ref 0.44–1.00)
GFR calc Af Amer: 52 mL/min — ABNORMAL LOW (ref >60)
GFR calc non Af Amer: 45 mL/min — ABNORMAL LOW (ref >60)
Glucose, Bld: 196 mg/dL — ABNORMAL HIGH (ref 70–99)
Potassium: 4.1 mmol/L (ref 3.5–5.1)
Sodium: 137 mmol/L (ref 135–145)

## 2018-12-14 MED ORDER — FLUTICASONE FUROATE-VILANTEROL 100-25 MCG/INH IN AEPB
1.0000 | INHALATION_SPRAY | Freq: Every day | RESPIRATORY_TRACT | Status: DC
  Administered 2018-12-15 – 2018-12-18 (×4): 1 via RESPIRATORY_TRACT
  Filled 2018-12-14: qty 28

## 2018-12-14 MED ORDER — ENOXAPARIN SODIUM 40 MG/0.4ML ~~LOC~~ SOLN
40.0000 mg | SUBCUTANEOUS | Status: DC
  Administered 2018-12-14 – 2018-12-17 (×4): 40 mg via SUBCUTANEOUS
  Filled 2018-12-14 (×4): qty 0.4

## 2018-12-14 NOTE — Progress Notes (Signed)
Admitted to 5M08 from ED via stretcher. No family with patient.Patient has meds,money and jewelry with her. Explained that hospital does not accept responsibility if lost or stolen. Patient agreed to have locked in Pharmacy and Security. See assessment as charted.

## 2018-12-14 NOTE — Progress Notes (Signed)
Patient's "oldest daughter", [place] called and stated that patient has not stopped smoking or drinking. Ms.Cox stated that patient smokes at least 2 cigarettes per day and drinks alcohol every couple of days. Ms.Cox lives in [place] and stated she had been speaking with her brother who lives with patient. Ms.Cox would like patient to be "tested for alcohol and cigarettes."

## 2018-12-14 NOTE — Plan of Care (Signed)
  Problem: Activity: Goal: Risk for activity intolerance will decrease Outcome: Progressing   

## 2018-12-14 NOTE — Progress Notes (Signed)
PROGRESS NOTE    [name]  NUU:[id number] DOB: Oct 01, 1934 DOA: 12/13/2018 PCP: Sonny Masters, FNP   Brief Narrative:  [name]  is a 84 y.o. female, COPD, CAD, hypertension and history of cardiomyopathy with ejection fraction 40-45% presenting with 2 day history of worsening shortness of breath and cough.  Patient denies any chest pains, nausea, vomiting or diarrhea.  No fever or chills. No headache, dizziness or loss of consciousness. In the emergency room her blood work was unremarkable and chest x-ray was negative for acute changes. Noted to be hypoxic at intake - 86% on room air at rest - received solumedrol 125 x1.    Subjective: No acute issues or events overnight, and gets her respiratory status is improving but not yet back to baseline, continues to require oxygen, dyspneic with minimal exertion.  Otherwise declines chest pain, nausea, vomiting, diarrhea, constipation, headache, fevers, chills.   Assessment & Plan:   Principal Problem:   Acute respiratory failure with hypoxia and hypercapnia (HCC) Active Problems:   Hyperlipidemia   Essential hypertension   Chronic diastolic heart failure (HCC)   COPD exacerbation (HCC)   Acute hypoxic respiratory failure 2/2 COPD exacerbation/bronchitis -Secondary to poor medical compliance, indicates that she has run out of Breo for some weeks now -Continue to wean oxygen, patient continues on 2 L nasal cannula at rest, baseline at home is room air even with exertion -Resume Breo at 100/25 daily -Continue Solu-Medrol/nebulizer treatments -Discontinue antibiotics, cefdinir not currently indicated as patient's chest x-ray appears to be without overt infiltrate, patient does not meet sepsis criteria or appear to have any ongoing pneumonia  HF with reduced EF 40-45% likely 2/2 cardiomyopathy per chart review -Patient appears euvolemic, continue to follow I's and O's, continue home meds including furosemide, losartan, potassium,  carvedilol.  Hypertension, essential -Currently well-controlled on Coreg/losartan  Depression -Continue with bupropion  Hypercholesterolemia -Continue with Pravachol  Bradycardia -Appears to be transient event, rate now markedly well controlled  -EKG remarkable for left bundle branch block, elevated QTC (of note patient does have history of torsades per chart review)  DVT prophylaxis: Lovenox Code Status: Full Family Communication: Daughter updated by phone Disposition Plan: Pending clinical improvement - likely dispo home in 48-72h pending resolution of symptoms  Consultants:   None  Procedures:   None  Antimicrobials:  Discontinued, cefdinir at admission   Objective: Vitals:   12/14/18 0455 12/14/18 0457 12/14/18 0841 12/14/18 0853  BP: (!) 119/104 127/76  (!) 136/59  Pulse: 90 99  (!) 112  Resp: 18   18  Temp: 98.4 F (36.9 C)   98.6 F (37 C)  TempSrc: Oral   Oral  SpO2: 96% 98% 98% 98%  Weight: 72.5 kg     Height: 5\' 4"  (1.626 m)       Intake/Output Summary (Last 24 hours) at 12/14/2018 1209 Last data filed at 12/14/2018 1049 Gross per 24 hour  Intake 2060 ml  Output 550 ml  Net 1510 ml   Filed Weights   12/13/18 2104 12/14/18 0455  Weight: 72.5 kg 72.5 kg    Examination:  General:  Pleasantly resting in bed, No acute distress. HEENT:  Normocephalic atraumatic.  Sclerae nonicteric, noninjected.  Extraocular movements intact bilaterally. Neck:  Without mass or deformity.  Trachea is midline. Lungs: Scant end expiratory bilateral wheeze, otherwise without overt rhonchi or rales. Heart:  Regular rate and rhythm.  Without murmurs, rubs, or gallops. Abdomen:  Soft, nontender, nondistended.  Without guarding or rebound.  Extremities: Without cyanosis, clubbing, edema, or obvious deformity. Vascular:  Dorsalis pedis and posterior tibial pulses palpable bilaterally. Skin:  Warm and dry, no erythema, no ulcerations.  Data Reviewed: I have personally  reviewed following labs and imaging studies  CBC: Recent Labs  Lab 12/13/18 1247  WBC 6.4  NEUTROABS 3.7  HGB 13.8  HCT 42.9  MCV 92.9  PLT 186   Basic Metabolic Panel: Recent Labs  Lab 12/13/18 1247 12/14/18 0547  NA 143 137  K 3.9 4.1  CL 108 107  CO2 25 20*  GLUCOSE 103* 196*  BUN 11 16  CREATININE 1.22* 1.14*  CALCIUM 9.0 8.9    Recent Results (from the past 240 hour(s))  SARS Coronavirus 2 [facility] order, Performed in [facility] hospital lab) Nasopharyngeal Nasopharyngeal Swab     Status: None   Collection Time: 12/13/18  3:08 PM   Specimen: Nasopharyngeal Swab  Result Value Ref Range Status   SARS Coronavirus 2 NEGATIVE NEGATIVE Final    Comment: (NOTE) If result is NEGATIVE SARS-CoV-2 target nucleic acids are NOT DETECTED. The SARS-CoV-2 RNA is generally detectable in upper and lower  respiratory specimens during the acute phase of infection. The lowest  concentration of SARS-CoV-2 viral copies this assay can detect is 250  copies / mL. A negative result does not preclude SARS-CoV-2 infection  and should not be used as the sole basis for treatment or other  patient management decisions.  A negative result may occur with  improper specimen collection / handling, submission of specimen other  than nasopharyngeal swab, presence of viral mutation(s) within the  areas targeted by this assay, and inadequate number of viral copies  (<250 copies / mL). A negative result must be combined with clinical  observations, patient history, and epidemiological information. If result is POSITIVE SARS-CoV-2 target nucleic acids are DETECTED. The SARS-CoV-2 RNA is generally detectable in upper and lower  respiratory specimens dur ing the acute phase of infection.  Positive  results are indicative of active infection with SARS-CoV-2.  Clinical  correlation with patient history and other diagnostic information is  necessary to determine patient infection status.  Positive  results do  not rule out bacterial infection or co-infection with other viruses. If result is PRESUMPTIVE POSTIVE SARS-CoV-2 nucleic acids MAY BE PRESENT.   A presumptive positive result was obtained on the submitted specimen  and confirmed on repeat testing.  While 2019 novel coronavirus  (SARS-CoV-2) nucleic acids may be present in the submitted sample  additional confirmatory testing may be necessary for epidemiological  and / or clinical management purposes  to differentiate between  SARS-CoV-2 and other Sarbecovirus currently known to infect humans.  If clinically indicated additional testing with an alternate test  methodology [phone]) is advised. The SARS-CoV-2 RNA is generally  detectable in upper and lower respiratory sp ecimens during the acute  phase of infection. The expected result is Negative. Fact Sheet for Patients:  BoilerBrush.com.cy Fact Sheet for Healthcare Providers: https://pope.com/ This test is not yet approved or cleared by the [place] FDA and has been authorized for detection and/or diagnosis of SARS-CoV-2 by FDA under an Emergency Use Authorization (EUA).  This EUA will remain in effect (meaning this test can be used) for the duration of the COVID-19 declaration under Section 564(b)(1) of the Act, 21 U.S.C. section 360bbb-3(b)(1), unless the authorization is terminated or revoked sooner. Performed at [facility], [address], [place], [place] [address]     Radiology Studies: Dg  Chest Port 1 View  Result Date: 12/13/2018 CLINICAL DATA:  84 year old female with shortness of breath for 2 days EXAM: PORTABLE CHEST 1 VIEW COMPARISON:  Prior chest x-ray 10/04/2018 FINDINGS: Stable cardiomegaly. Atherosclerotic calcifications again noted in the transverse aorta. The lungs are clear save for minimal chronic bronchitic changes. Prior surgical changes of right shoulder arthroplasty. No acute osseous  abnormality. IMPRESSION: Stable chest x-ray without evidence of acute cardiopulmonary process. Electronically Signed   By: Jacqulynn Cadet M.D.   On: 12/13/2018 13:14   Scheduled Meds: . aspirin EC  81 mg Oral QPM  . buPROPion  150 mg Oral BID  . carvedilol  6.25 mg Oral BID WC  . enoxaparin (LOVENOX) injection  30 mg Subcutaneous Q24H  . fluticasone furoate-vilanterol  1 puff Inhalation Daily  . furosemide  20 mg Oral Daily  . gabapentin  100 mg Oral TID  . ipratropium-albuterol  3 mL Nebulization TID  . loratadine  10 mg Oral Daily  . losartan  25 mg Oral Daily  . memantine  10 mg Oral BID  . methylPREDNISolone (SOLU-MEDROL) injection  60 mg Intravenous Q6H  . pantoprazole  40 mg Oral Daily  . potassium chloride SA  20 mEq Oral Daily  . pravastatin  80 mg Oral Daily  . raloxifene  60 mg Oral Daily  . sodium chloride flush  3 mL Intravenous Q12H  . trimethoprim-polymyxin b  2 drop Both Eyes Q4H   Continuous Infusions: . sodium chloride Stopped (12/13/18 1719)  . sodium chloride       LOS: 1 day   Time spent: [place], DO [facility]  If 7PM-7AM, please contact night-coverage [email or web address] Password [email or web address] 12/14/2018, 12:09 PM

## 2018-12-14 NOTE — Progress Notes (Signed)
Patient c/o " SOB, couldn't breathe" after breathing treatment tonight. Patient also c/o being hot. Patient pulled up in bed, nasal cannula readjusted, covers removed. pOx 98% on 2 L, resp 18, no further c/o sob,difficulty breathing. K.Eubanks texted. No change in orders.

## 2018-12-14 NOTE — Progress Notes (Signed)
Patient decided to keep belongings/valuables @ bedside. Anticipates d/c on Monday.

## 2018-12-15 ENCOUNTER — Ambulatory Visit: Payer: Medicare Other | Admitting: Family Medicine

## 2018-12-15 LAB — CBC
HCT: 42.4 % (ref 36.0–46.0)
Hemoglobin: 13.9 g/dL (ref 12.0–15.0)
MCH: 30.3 pg (ref 26.0–34.0)
MCHC: 32.8 g/dL (ref 30.0–36.0)
MCV: 92.4 fL (ref 80.0–100.0)
Platelets: 307 10*3/uL (ref 150–400)
RBC: 4.59 MIL/uL (ref 3.87–5.11)
RDW: 15.1 % (ref 11.5–15.5)
WBC: 18.9 10*3/uL — ABNORMAL HIGH (ref 4.0–10.5)
nRBC: 0 % (ref 0.0–0.2)

## 2018-12-15 LAB — BASIC METABOLIC PANEL
Anion gap: 9 (ref 5–15)
BUN: 19 mg/dL (ref 8–23)
CO2: 23 mmol/L (ref 22–32)
Calcium: 9 mg/dL (ref 8.9–10.3)
Chloride: 110 mmol/L (ref 98–111)
Creatinine, Ser: 1.15 mg/dL — ABNORMAL HIGH (ref 0.44–1.00)
GFR calc Af Amer: 52 mL/min — ABNORMAL LOW (ref >60)
GFR calc non Af Amer: 45 mL/min — ABNORMAL LOW (ref >60)
Glucose, Bld: 136 mg/dL — ABNORMAL HIGH (ref 70–99)
Potassium: 4.1 mmol/L (ref 3.5–5.1)
Sodium: 142 mmol/L (ref 135–145)

## 2018-12-15 MED ORDER — ACETAMINOPHEN 325 MG PO TABS
650.0000 mg | ORAL_TABLET | ORAL | Status: DC | PRN
  Administered 2018-12-15 – 2018-12-16 (×2): 650 mg via ORAL
  Filled 2018-12-15: qty 2

## 2018-12-15 MED ORDER — IPRATROPIUM-ALBUTEROL 0.5-2.5 (3) MG/3ML IN SOLN
3.0000 mL | Freq: Two times a day (BID) | RESPIRATORY_TRACT | Status: DC
  Administered 2018-12-15 – 2018-12-18 (×6): 3 mL via RESPIRATORY_TRACT
  Filled 2018-12-15 (×6): qty 3

## 2018-12-15 NOTE — Progress Notes (Signed)
PROGRESS NOTE    ANNMARGARET CARUSONE  OVF:[id number] DOB: 22-Jan-1935 DOA: 12/13/2018 PCP: Sonny Masters, FNP   Brief Narrative:  Heilyn Gagne  is a 84 y.o. female, COPD, CAD, hypertension and history of cardiomyopathy with ejection fraction 40-45% presenting with 2 day history of worsening shortness of breath and cough.  Patient denies any chest pains, nausea, vomiting or diarrhea.  No fever or chills. No headache, dizziness or loss of consciousness. In the emergency room her blood work was unremarkable and chest x-ray was negative for acute changes. Noted to be hypoxic at intake - 86% on room air at rest - received solumedrol 125 x1.    Subjective: No acute issues or events overnight, and gets her respiratory status is improving but not yet back to baseline, continues to require oxygen, dyspneic with minimal exertion.  Otherwise declines chest pain, nausea, vomiting, diarrhea, constipation, headache, fevers, chills.  Assessment & Plan:   Principal Problem:   Acute respiratory failure with hypoxia and hypercapnia (HCC) Active Problems:   Hyperlipidemia   Essential hypertension   Chronic diastolic heart failure (HCC)   COPD exacerbation (HCC)   Acute hypoxic respiratory failure 2/2 COPD exacerbation/bronchitis -Secondary to poor medical compliance, indicates that she has run out of Breo for some weeks now -Continue to wean oxygen, patient continues on 2 L nasal cannula at rest, baseline at home is room air even with exertion -Resume Breo at 100/25 daily -Continue Solu-Medrol/nebulizer treatments -Previously discontinued all antibiotics, on cefdinir at admission not currently indicated as patient's chest x-ray appears to be without overt infiltrate, patient does not meet sepsis criteria or appear to have any ongoing pneumonia  HF with reduced EF 40-45% likely 2/2 cardiomyopathy per chart review -Patient appears euvolemic, continue to follow I's and O's, continue home meds including  furosemide, losartan, potassium, carvedilol.  Hypertension, essential -Currently well-controlled on Coreg/losartan  Depression -Continue with bupropion  Hypercholesterolemia -Continue with Pravachol  Bradycardia -Appears to be transient event, rate now markedly well controlled  -EKG remarkable for left bundle branch block, elevated QTC (of note patient does have history of torsades per chart review)  DVT prophylaxis: Lovenox Code Status: Full Family Communication: Daughter updated by phone Disposition Plan: Pending clinical improvement - likely dispo home in 48-72h pending resolution of symptoms, ambulation without hypoxia or requiring supplemental oxygen.  Consultants:   None  Procedures:   None  Antimicrobials:  Discontinued, cefdinir at admission   Objective: Vitals:   12/15/18 0500 12/15/18 0826 12/15/18 0828 12/15/18 0856  BP:    (!) 155/90  Pulse:    82  Resp:    18  Temp:    98.3 F (36.8 C)  TempSrc:    Oral  SpO2: 97% 96% 96% 94%  Weight:      Height:        Intake/Output Summary (Last 24 hours) at 12/15/2018 1317 Last data filed at 12/15/2018 1042 Gross per 24 hour  Intake 1360 ml  Output 451 ml  Net 909 ml   Filed Weights   12/13/18 2104 12/14/18 0455  Weight: 72.5 kg 72.5 kg    Examination:  General:  Pleasantly resting in bed, No acute distress. HEENT:  Normocephalic atraumatic.  Sclerae nonicteric, noninjected.  Extraocular movements intact bilaterally. Neck:  Without mass or deformity.  Trachea is midline. Lungs: Scant end expiratory bilateral wheeze, otherwise without overt rhonchi or rales. Heart:  Regular rate and rhythm.  Without murmurs, rubs, or gallops. Abdomen:  Soft, nontender, nondistended.  Without guarding  or rebound. Extremities: Without cyanosis, clubbing, edema, or obvious deformity. Vascular:  Dorsalis pedis and posterior tibial pulses palpable bilaterally. Skin:  Warm and dry, no erythema, no ulcerations.  Data  Reviewed: I have personally reviewed following labs and imaging studies  CBC: Recent Labs  Lab 12/13/18 1247 12/15/18 0711  WBC 6.4 18.9*  NEUTROABS 3.7  --   HGB 13.8 13.9  HCT 42.9 42.4  MCV 92.9 92.4  PLT 186 307   Basic Metabolic Panel: Recent Labs  Lab 12/13/18 1247 12/14/18 0547 12/15/18 0711  NA 143 137 142  K 3.9 4.1 4.1  CL 108 107 110  CO2 25 20* 23  GLUCOSE 103* 196* 136*  BUN 11 16 19   CREATININE 1.22* 1.14* 1.15*  CALCIUM 9.0 8.9 9.0    Recent Results (from the past 240 hour(s))  SARS Coronavirus 2 [facility] order, Performed in [facility] hospital lab) Nasopharyngeal Nasopharyngeal Swab     Status: None   Collection Time: 12/13/18  3:08 PM   Specimen: Nasopharyngeal Swab  Result Value Ref Range Status   SARS Coronavirus 2 NEGATIVE NEGATIVE Final    Comment: (NOTE) If result is NEGATIVE SARS-CoV-2 target nucleic acids are NOT DETECTED. The SARS-CoV-2 RNA is generally detectable in upper and lower  respiratory specimens during the acute phase of infection. The lowest  concentration of SARS-CoV-2 viral copies this assay can detect is 250  copies / mL. A negative result does not preclude SARS-CoV-2 infection  and should not be used as the sole basis for treatment or other  patient management decisions.  A negative result may occur with  improper specimen collection / handling, submission of specimen other  than nasopharyngeal swab, presence of viral mutation(s) within the  areas targeted by this assay, and inadequate number of viral copies  (<250 copies / mL). A negative result must be combined with clinical  observations, patient history, and epidemiological information. If result is POSITIVE SARS-CoV-2 target nucleic acids are DETECTED. The SARS-CoV-2 RNA is generally detectable in upper and lower  respiratory specimens dur ing the acute phase of infection.  Positive  results are indicative of active infection with SARS-CoV-2.  Clinical  correlation  with patient history and other diagnostic information is  necessary to determine patient infection status.  Positive results do  not rule out bacterial infection or co-infection with other viruses. If result is PRESUMPTIVE POSTIVE SARS-CoV-2 nucleic acids MAY BE PRESENT.   A presumptive positive result was obtained on the submitted specimen  and confirmed on repeat testing.  While 2019 novel coronavirus  (SARS-CoV-2) nucleic acids may be present in the submitted sample  additional confirmatory testing may be necessary for epidemiological  and / or clinical management purposes  to differentiate between  SARS-CoV-2 and other Sarbecovirus currently known to infect humans.  If clinically indicated additional testing with an alternate test  methodology [phone]) is advised. The SARS-CoV-2 RNA is generally  detectable in upper and lower respiratory sp ecimens during the acute  phase of infection. The expected result is Negative. Fact Sheet for Patients:  (YIF0277 Fact Sheet for Healthcare Providers: BoilerBrush.com.cy This test is not yet approved or cleared by the https://pope.com/ FDA and has been authorized for detection and/or diagnosis of SARS-CoV-2 by FDA under an Emergency Use Authorization (EUA).  This EUA will remain in effect (meaning this test can be used) for the duration of the COVID-19 declaration under Section 564(b)(1) of the Act, 21 U.S.C. section 360bbb-3(b)(1), unless the authorization is terminated or  revoked sooner. Performed at [facility], [place] [address], [place], [place] [address]     Radiology Studies: No results found. Scheduled Meds: . aspirin EC  81 mg Oral QPM  . buPROPion  150 mg Oral BID  . carvedilol  6.25 mg Oral BID WC  . enoxaparin (LOVENOX) injection  40 mg Subcutaneous Q24H  . fluticasone furoate-vilanterol  1 puff Inhalation Daily  . furosemide  20 mg Oral Daily  . gabapentin  100 mg  Oral TID  . ipratropium-albuterol  3 mL Nebulization BID  . loratadine  10 mg Oral Daily  . losartan  25 mg Oral Daily  . memantine  10 mg Oral BID  . methylPREDNISolone (SOLU-MEDROL) injection  60 mg Intravenous Q6H  . pantoprazole  40 mg Oral Daily  . potassium chloride SA  20 mEq Oral Daily  . pravastatin  80 mg Oral Daily  . raloxifene  60 mg Oral Daily  . sodium chloride flush  3 mL Intravenous Q12H  . trimethoprim-polymyxin b  2 drop Both Eyes Q4H   Continuous Infusions: . sodium chloride Stopped (12/13/18 1719)  . sodium chloride       LOS: 2 days   Time spent: [place], DO [facility]  If 7PM-7AM, please contact night-coverage [email or web address] Password [email or web address] 12/15/2018, 1:17 PM

## 2018-12-15 NOTE — Plan of Care (Signed)
  Problem: Clinical Measurements: Goal: Respiratory complications will improve Outcome: Progressing   Problem: Activity: Goal: Risk for activity intolerance will decrease Outcome: Progressing   Problem: Education: Goal: Knowledge of General Education information will improve Description: Including pain rating scale, medication(s)/side effects and non-pharmacologic comfort measures Outcome: Progressing   

## 2018-12-16 ENCOUNTER — Telehealth: Payer: Self-pay | Admitting: Family Medicine

## 2018-12-16 LAB — CBC
HCT: 40.9 % (ref 36.0–46.0)
Hemoglobin: 13.3 g/dL (ref 12.0–15.0)
MCH: 29.6 pg (ref 26.0–34.0)
MCHC: 32.5 g/dL (ref 30.0–36.0)
MCV: 90.9 fL (ref 80.0–100.0)
Platelets: 280 10*3/uL (ref 150–400)
RBC: 4.5 MIL/uL (ref 3.87–5.11)
RDW: 15.1 % (ref 11.5–15.5)
WBC: 14.6 10*3/uL — ABNORMAL HIGH (ref 4.0–10.5)
nRBC: 0 % (ref 0.0–0.2)

## 2018-12-16 LAB — BASIC METABOLIC PANEL
Anion gap: 8 (ref 5–15)
BUN: 20 mg/dL (ref 8–23)
CO2: 25 mmol/L (ref 22–32)
Calcium: 8.6 mg/dL — ABNORMAL LOW (ref 8.9–10.3)
Chloride: 109 mmol/L (ref 98–111)
Creatinine, Ser: 1.19 mg/dL — ABNORMAL HIGH (ref 0.44–1.00)
GFR calc Af Amer: 50 mL/min — ABNORMAL LOW (ref >60)
GFR calc non Af Amer: 43 mL/min — ABNORMAL LOW (ref >60)
Glucose, Bld: 110 mg/dL — ABNORMAL HIGH (ref 70–99)
Potassium: 4 mmol/L (ref 3.5–5.1)
Sodium: 142 mmol/L (ref 135–145)

## 2018-12-16 MED ORDER — METHYLPREDNISOLONE SODIUM SUCC 40 MG IJ SOLR
40.0000 mg | Freq: Two times a day (BID) | INTRAMUSCULAR | Status: DC
  Administered 2018-12-16 – 2018-12-18 (×4): 40 mg via INTRAVENOUS
  Filled 2018-12-16 (×4): qty 1

## 2018-12-16 NOTE — Progress Notes (Signed)
PROGRESS NOTE    HAELEIGH [name]  VFI:[id number] DOB: 13-Aug-1934 DOA: 12/13/2018 PCP: Sonny Masters, FNP   Brief Narrative:  [name]  is a 84 y.o. female, COPD, CAD, hypertension and history of cardiomyopathy with ejection fraction 40-45% presenting with 2 day history of worsening shortness of breath and cough.  Patient denies any chest pains, nausea, vomiting or diarrhea.  No fever or chills. No headache, dizziness or loss of consciousness. In the emergency room her blood work was unremarkable and chest x-ray was negative for acute changes. Noted to be hypoxic at intake - 86% on room air at rest - received solumedrol 125 x1.    Subjective: No acute issues or events overnight, patient indicates her respiratory status is improving but not yet back to baseline, continues to require oxygen, patient remains dyspneic with minimal exertion. Otherwise declines chest pain, nausea, vomiting, diarrhea, constipation, headache, fevers, chills.  Assessment & Plan:   Principal Problem:   Acute respiratory failure with hypoxia and hypercapnia (HCC) Active Problems:   Hyperlipidemia   Essential hypertension   Chronic diastolic heart failure (HCC)   COPD exacerbation (HCC)   Acute hypoxic respiratory failure 2/2 COPD exacerbation/bronchitis -Secondary to poor medication compliance, indicates that she has run out of Breo for some weeks now; unclear if she uses home nebulizers as often as indicated -Patient continues to smoke -Continue to wean oxygen, patient continues on 2 L nasal cannula at rest, baseline at home is room air even with exertion -Continue Breo at 100/25 daily; Solu-Medrol/nebulizer treatments -Begin to wean methylprednisolone down - 40q12 (previously 60q6) -Previously discontinued all antibiotics, on cefdinir at admission not currently indicated as patient's chest x-ray appears to be without overt infiltrate, patient does not meet sepsis criteria or appear to have any ongoing  pneumonia  HF with reduced EF 40-45% likely 2/2 cardiomyopathy per chart review -Patient appears euvolemic, continue to follow I's and O's, continue home meds including furosemide, losartan, potassium, carvedilol.  Hypertension, essential -Currently well-controlled on Coreg/losartan  Depression -Continue with bupropion  Hypercholesterolemia -Continue with Pravachol  Bradycardia -Appears to be transient event, rate now markedly well controlled  -EKG remarkable for left bundle branch block, elevated QTC (of note patient does have history of torsades per chart review)  DVT prophylaxis: Lovenox Code Status: Full Family Communication: Daughter updated by phone Disposition Plan: Pending clinical improvement - likely dispo home in 24-48h pending resolution of symptoms, ambulation without hypoxia and is no longer requiring supplemental oxygen.  Consultants:   None  Procedures:   None  Antimicrobials:  Discontinued (cefdinir at admission x 1 day)   Objective: Vitals:   12/15/18 2124 12/16/18 0515 12/16/18 0805 12/16/18 0807  BP: (!) 141/83 (!) 163/74    Pulse: 64 62    Resp: 20 20    Temp: 97.8 F (36.6 C) 98.4 F (36.9 C)    TempSrc: Oral Oral    SpO2: 93% 98% 97% 97%  Weight:      Height:        Intake/Output Summary (Last 24 hours) at 12/16/2018 0811 Last data filed at 12/15/2018 1802 Gross per 24 hour  Intake 1050 ml  Output 750 ml  Net 300 ml   Filed Weights   12/13/18 2104 12/14/18 0455  Weight: 72.5 kg 72.5 kg    Examination:  General:  Pleasantly resting in bed, No acute distress. HEENT:  Normocephalic atraumatic.  Sclerae nonicteric, noninjected.  Extraocular movements intact bilaterally. Neck:  Without mass or deformity.  Trachea is  midline. Lungs: CTAB, without overt wheeze, rales, or rhonchi Heart:  Regular rate and rhythm.  Without murmurs, rubs, or gallops. Abdomen:  Soft, nontender, nondistended.  Without guarding or rebound. Extremities:  Without cyanosis, clubbing, edema, or obvious deformity. Vascular:  Dorsalis pedis and posterior tibial pulses palpable bilaterally. Skin:  Warm and dry, no erythema, no ulcerations.  Data Reviewed: I have personally reviewed following labs and imaging studies  CBC: Recent Labs  Lab 12/13/18 1247 12/15/18 0711 12/16/18 0555  WBC 6.4 18.9* 14.6*  NEUTROABS 3.7  --   --   HGB 13.8 13.9 13.3  HCT 42.9 42.4 40.9  MCV 92.9 92.4 90.9  PLT 186 307 280   Basic Metabolic Panel: Recent Labs  Lab 12/13/18 1247 12/14/18 0547 12/15/18 0711 12/16/18 0555  NA 143 137 142 142  K 3.9 4.1 4.1 4.0  CL 108 107 110 109  CO2 25 20* 23 25  GLUCOSE 103* 196* 136* 110*  BUN 11 16 19 20   CREATININE 1.22* 1.14* 1.15* 1.19*  CALCIUM 9.0 8.9 9.0 8.6*    Recent Results (from the past 240 hour(s))  SARS Coronavirus 2 [facility] order, Performed in [facility] hospital lab) Nasopharyngeal Nasopharyngeal Swab     Status: None   Collection Time: 12/13/18  3:08 PM   Specimen: Nasopharyngeal Swab  Result Value Ref Range Status   SARS Coronavirus 2 NEGATIVE NEGATIVE Final    Comment: (NOTE) If result is NEGATIVE SARS-CoV-2 target nucleic acids are NOT DETECTED. The SARS-CoV-2 RNA is generally detectable in upper and lower  respiratory specimens during the acute phase of infection. The lowest  concentration of SARS-CoV-2 viral copies this assay can detect is 250  copies / mL. A negative result does not preclude SARS-CoV-2 infection  and should not be used as the sole basis for treatment or other  patient management decisions.  A negative result may occur with  improper specimen collection / handling, submission of specimen other  than nasopharyngeal swab, presence of viral mutation(s) within the  areas targeted by this assay, and inadequate number of viral copies  (<250 copies / mL). A negative result must be combined with clinical  observations, patient history, and epidemiological information. If  result is POSITIVE SARS-CoV-2 target nucleic acids are DETECTED. The SARS-CoV-2 RNA is generally detectable in upper and lower  respiratory specimens dur ing the acute phase of infection.  Positive  results are indicative of active infection with SARS-CoV-2.  Clinical  correlation with patient history and other diagnostic information is  necessary to determine patient infection status.  Positive results do  not rule out bacterial infection or co-infection with other viruses. If result is PRESUMPTIVE POSTIVE SARS-CoV-2 nucleic acids MAY BE PRESENT.   A presumptive positive result was obtained on the submitted specimen  and confirmed on repeat testing.  While 2019 novel coronavirus  (SARS-CoV-2) nucleic acids may be present in the submitted sample  additional confirmatory testing may be necessary for epidemiological  and / or clinical management purposes  to differentiate between  SARS-CoV-2 and other Sarbecovirus currently known to infect humans.  If clinically indicated additional testing with an alternate test  methodology [phone]) is advised. The SARS-CoV-2 RNA is generally  detectable in upper and lower respiratory sp ecimens during the acute  phase of infection. The expected result is Negative. Fact Sheet for Patients:  BoilerBrush.com.cy Fact Sheet for Healthcare Providers: https://pope.com/ This test is not yet approved or cleared by the [place] FDA and has been authorized for  detection and/or diagnosis of SARS-CoV-2 by FDA under an Emergency Use Authorization (EUA).  This EUA will remain in effect (meaning this test can be used) for the duration of the COVID-19 declaration under Section 564(b)(1) of the Act, 21 U.S.C. section 360bbb-3(b)(1), unless the authorization is terminated or revoked sooner. Performed at [facility], [place] [address], [place], [place] [address]     Radiology Studies: No results found.  Scheduled Meds: . aspirin EC  81 mg Oral QPM  . buPROPion  150 mg Oral BID  . carvedilol  6.25 mg Oral BID WC  . enoxaparin (LOVENOX) injection  40 mg Subcutaneous Q24H  . fluticasone furoate-vilanterol  1 puff Inhalation Daily  . furosemide  20 mg Oral Daily  . gabapentin  100 mg Oral TID  . ipratropium-albuterol  3 mL Nebulization BID  . loratadine  10 mg Oral Daily  . losartan  25 mg Oral Daily  . memantine  10 mg Oral BID  . methylPREDNISolone (SOLU-MEDROL) injection  60 mg Intravenous Q6H  . pantoprazole  40 mg Oral Daily  . potassium chloride SA  20 mEq Oral Daily  . pravastatin  80 mg Oral Daily  . raloxifene  60 mg Oral Daily  . sodium chloride flush  3 mL Intravenous Q12H  . trimethoprim-polymyxin b  2 drop Both Eyes Q4H   Continuous Infusions: . sodium chloride Stopped (12/13/18 1719)  . sodium chloride       LOS: 3 days   Time spent: [place], DO [facility]  If 7PM-7AM, please contact night-coverage [email or web address] Password [email or web address] 12/16/2018, 8:11 AM

## 2018-12-16 NOTE — Plan of Care (Signed)
  Problem: Education: Goal: Knowledge of General Education information will improve Description: Including pain rating scale, medication(s)/side effects and non-pharmacologic comfort measures Outcome: Progressing   Problem: Clinical Measurements: Goal: Respiratory complications will improve Outcome: Progressing   Problem: Activity: Goal: Risk for activity intolerance will decrease Outcome: Progressing   

## 2018-12-16 NOTE — Care Management Important Message (Signed)
Important Message  Patient Details  Name: [name] MRN: [id number] Date of Birth: 1934-07-31   Medicare Important Message Given:  Yes     Orbie Pyo 12/16/2018, 3:39 PM

## 2018-12-17 ENCOUNTER — Ambulatory Visit: Payer: Medicare Other | Admitting: Family Medicine

## 2018-12-17 DIAGNOSIS — I5032 Chronic diastolic (congestive) heart failure: Secondary | ICD-10-CM

## 2018-12-17 DIAGNOSIS — E782 Mixed hyperlipidemia: Secondary | ICD-10-CM

## 2018-12-17 LAB — CBC
HCT: 45.7 % (ref 36.0–46.0)
Hemoglobin: 14.9 g/dL (ref 12.0–15.0)
MCH: 30 pg (ref 26.0–34.0)
MCHC: 32.6 g/dL (ref 30.0–36.0)
MCV: 92.1 fL (ref 80.0–100.0)
Platelets: 316 10*3/uL (ref 150–400)
RBC: 4.96 MIL/uL (ref 3.87–5.11)
RDW: 15.2 % (ref 11.5–15.5)
WBC: 14.7 10*3/uL — ABNORMAL HIGH (ref 4.0–10.5)
nRBC: 0.1 % (ref 0.0–0.2)

## 2018-12-17 LAB — BASIC METABOLIC PANEL
Anion gap: 9 (ref 5–15)
BUN: 25 mg/dL — ABNORMAL HIGH (ref 8–23)
CO2: 27 mmol/L (ref 22–32)
Calcium: 8.7 mg/dL — ABNORMAL LOW (ref 8.9–10.3)
Chloride: 106 mmol/L (ref 98–111)
Creatinine, Ser: 1.24 mg/dL — ABNORMAL HIGH (ref 0.44–1.00)
GFR calc Af Amer: 47 mL/min — ABNORMAL LOW (ref >60)
GFR calc non Af Amer: 41 mL/min — ABNORMAL LOW (ref >60)
Glucose, Bld: 120 mg/dL — ABNORMAL HIGH (ref 70–99)
Potassium: 3.9 mmol/L (ref 3.5–5.1)
Sodium: 142 mmol/L (ref 135–145)

## 2018-12-17 NOTE — Plan of Care (Signed)
  Problem: Education: Goal: Knowledge of General Education information will improve Description: Including pain rating scale, medication(s)/side effects and non-pharmacologic comfort measures Outcome: Progressing   Problem: Safety: Goal: Ability to remain free from injury will improve Outcome: Progressing   

## 2018-12-17 NOTE — Progress Notes (Signed)
PROGRESS NOTE    [name]  [id number]:[id number] DOB: [birth year] DOA: 12/13/2018 PCP: Sonny Mastersakes, Linda M, FNP   Brief Narrative:  MarieWebsteris a83 y.o.female,COPD, CAD, hypertension and history of cardiomyopathy with ejection fraction 40-45% presenting with 2 day history of worsening shortness of breath and cough. Patient denies any chest pains, nausea, vomiting or diarrhea. No fever or chills. No headache, dizziness or loss of consciousness. In the emergency room her blood work was unremarkable and chest x-ray was negative for acute changes. Noted to be hypoxic at intake - 86% on room air at rest - received solumedrol 125 x1.  Assessment & Plan   Acute hypoxic respiratory failure secondary to COPD exacerbation/bronchitis -Secondary to poor medication compliance as well as continued smoking  -Patient states that she has run out of her Brio for some weeks, unclear if she uses her home nebulizers as indicated -Patient currently on room air and maintaining oxygen saturations in the high 90s -Patient was started on Solu-Medrol as well as nebulizer treatments -Chest x-ray reviewed and does not show any infiltrate.  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  Echo cardiogram in 2016 2017 showed an EF of 45 to 50%. -Continue Lasix, losartan, Coreg  Essential hypertension -Stable, continue Coreg, losartan  Depression -Continue bupropion  Hypercholesterolemia -Continue statin  Bradycardia -Appears to be transient event, rate now markedly well controlled  -EKG remarkable for left bundle branch block, elevated QTC (of note patient does have history of torsades per chart review)  Chronic kidney  disease, stage III -Creatinine currently stable  DVT Prophylaxis  lovenox  Code Status: Full  Family Communication: None at bedside  Disposition Plan: Admitted. [facility]  Consultants None  Procedures  None  Antibiotics   Anti-infectives (From admission, onward)   Start     Dose/Rate Route Frequency Ordered Stop   12/13/18 2200  cefdinir (OMNICEF) capsule 300 mg  Status:  Discontinued     300 mg Oral Every 12 hours 12/13/18 2120 12/14/18 1159      Subjective:   Baxter HireMarie Godbey seen and examined today.  Feels her breathing has improved but not back to normal. Denies current chest pain, abdominal pain, nausea vomiting, diarrhea constipation, dizziness or headache. Objective:   Vitals:   12/17/18 0529 12/17/18 0600 12/17/18 0812 12/17/18 1000  BP: (!) 146/86   (!) 148/79  Pulse: (!) 118 62 78 62  Resp: 18  14   Temp: 98 F (36.7 C)   98.1 F (36.7 C)  TempSrc:    Oral  SpO2: 93% 97% 97% 96%  Weight:      Height:        Intake/Output Summary (Last 24 hours) at 12/17/2018 1522 Last data filed at 12/17/2018 0600 Gross per 24 hour  Intake 460 ml  Output 0 ml  Net 460 ml   Filed Weights   12/13/18 2104 12/14/18 0455 12/16/18 2112  Weight: 72.5 kg 72.5 kg 72.8 kg    Exam  General: Well developed, well nourished, NAD, appears stated age  HEENT: NCAT, , mucous membranes moist.   Cardiovascular: S1 S2 auscultated, RRR, no murmur  Respiratory: Clear to auscultation bilaterally with equal chest rise  Abdomen: Soft, nontender, nondistended, + bowel sounds  Extremities: warm dry without cyanosis clubbing  or edema  Neuro: AAOx3, nonfocal  Psych: Appropriate mood and affect, pleasant    Data Reviewed: I have personally reviewed following labs and imaging studies  CBC: Recent Labs  Lab 12/13/18 1247 12/15/18 0711 12/16/18 0555 12/17/18 0626  WBC 6.4 18.9* 14.6* 14.7*  NEUTROABS 3.7  --   --   --   HGB 13.8 13.9 13.3 14.9   HCT 42.9 42.4 40.9 45.7  MCV 92.9 92.4 90.9 92.1  PLT 186 307 280 316   Basic Metabolic Panel: Recent Labs  Lab 12/13/18 1247 12/14/18 0547 12/15/18 0711 12/16/18 0555 12/17/18 0626  NA 143 137 142 142 142  K 3.9 4.1 4.1 4.0 3.9  CL 108 107 110 109 106  CO2 25 20* 23 25 27   GLUCOSE 103* 196* 136* 110* 120*  BUN 11 16 19 20  25*  CREATININE 1.22* 1.14* 1.15* 1.19* 1.24*  CALCIUM 9.0 8.9 9.0 8.6* 8.7*   GFR: Estimated Creatinine Clearance: 34.8 mL/min (A) (by C-G formula based on SCr of 1.24 mg/dL (H)). Liver Function Tests: No results for input(s): AST, ALT, ALKPHOS, BILITOT, PROT, ALBUMIN in the last 168 hours. No results for input(s): LIPASE, AMYLASE in the last 168 hours. No results for input(s): AMMONIA in the last 168 hours. Coagulation Profile: No results for input(s): INR, PROTIME in the last 168 hours. Cardiac Enzymes: No results for input(s): CKTOTAL, CKMB, CKMBINDEX, TROPONINI in the last 168 hours. BNP (last 3 results) No results for input(s): PROBNP in the last 8760 hours. HbA1C: No results for input(s): HGBA1C in the last 72 hours. CBG: No results for input(s): GLUCAP in the last 168 hours. Lipid Profile: No results for input(s): CHOL, HDL, LDLCALC, TRIG, CHOLHDL, LDLDIRECT in the last 72 hours. Thyroid Function Tests: No results for input(s): TSH, T4TOTAL, FREET4, T3FREE, THYROIDAB in the last 72 hours. Anemia Panel: No results for input(s): VITAMINB12, FOLATE, FERRITIN, TIBC, IRON, RETICCTPCT in the last 72 hours. Urine analysis:    Component Value Date/Time   COLORURINE YELLOW 04/05/2016 1654   APPEARANCEUR HAZY (A) 04/05/2016 1654   APPEARANCEUR Clear 10/28/2015 1622   LABSPEC 1.039 (H) 04/05/2016 1654   PHURINE 8.0 04/05/2016 1654   GLUCOSEU NEGATIVE 04/05/2016 1654   HGBUR NEGATIVE 04/05/2016 1654   BILIRUBINUR NEGATIVE 04/05/2016 1654   BILIRUBINUR Negative 10/28/2015 1622   KETONESUR NEGATIVE 04/05/2016 1654   PROTEINUR NEGATIVE 04/05/2016  1654   UROBILINOGEN 0.2 10/04/2013 1457   NITRITE NEGATIVE 04/05/2016 1654   LEUKOCYTESUR NEGATIVE 04/05/2016 1654   LEUKOCYTESUR Trace (A) 10/28/2015 1622   Sepsis Labs: @LABRCNTIP (procalcitonin:4,lacticidven:4)  ) Recent Results (from the past 240 hour(s))  SARS Coronavirus 2 [facility] order, Performed in [facility] hospital lab) Nasopharyngeal Nasopharyngeal Swab     Status: None   Collection Time: 12/13/18  3:08 PM   Specimen: Nasopharyngeal Swab  Result Value Ref Range Status   SARS Coronavirus 2 NEGATIVE NEGATIVE Final    Comment: (NOTE) If result is NEGATIVE SARS-CoV-2 target nucleic acids are NOT DETECTED. The SARS-CoV-2 RNA is generally detectable in upper and lower  respiratory specimens during the acute phase of infection. The lowest  concentration of SARS-CoV-2 viral copies this assay can detect is 250  copies / mL. A negative result does not preclude SARS-CoV-2 infection  and should not be used as the sole basis for treatment or other  patient management decisions.  A negative result may occur with  improper specimen collection / handling, submission of specimen other  than nasopharyngeal swab, presence of viral mutation(s)  within the  areas targeted by this assay, and inadequate number of viral copies  (<250 copies / mL). A negative result must be combined with clinical  observations, patient history, and epidemiological information. If result is POSITIVE SARS-CoV-2 target nucleic acids are DETECTED. The SARS-CoV-2 RNA is generally detectable in upper and lower  respiratory specimens dur ing the acute phase of infection.  Positive  results are indicative of active infection with SARS-CoV-2.  Clinical  correlation with patient history and other diagnostic information is  necessary to determine patient infection status.  Positive results do  not rule out bacterial infection or co-infection with other viruses. If result is PRESUMPTIVE POSTIVE SARS-CoV-2 nucleic  acids MAY BE PRESENT.   A presumptive positive result was obtained on the submitted specimen  and confirmed on repeat testing.  While 2019 novel coronavirus  (SARS-CoV-2) nucleic acids may be present in the submitted sample  additional confirmatory testing may be necessary for epidemiological  and / or clinical management purposes  to differentiate between  SARS-CoV-2 and other Sarbecovirus currently known to infect humans.  If clinically indicated additional testing with an alternate test  methodology [phone]) is advised. The SARS-CoV-2 RNA is generally  detectable in upper and lower respiratory sp ecimens during the acute  phase of infection. The expected result is Negative. Fact Sheet for Patients:  StrictlyIdeas.no Fact Sheet for Healthcare Providers: BankingDealers.co.za This test is not yet approved or cleared by the [place] FDA and has been authorized for detection and/or diagnosis of SARS-CoV-2 by FDA under an Emergency Use Authorization (EUA).  This EUA will remain in effect (meaning this test can be used) for the duration of the COVID-19 declaration under Section 564(b)(1) of the Act, 21 U.S.C. section 360bbb-3(b)(1), unless the authorization is terminated or revoked sooner. Performed at [facility], [place] [address], [place], [place] [address]       Radiology Studies: No results found.   Scheduled Meds: . aspirin EC  81 mg Oral QPM  . buPROPion  150 mg Oral BID  . carvedilol  6.25 mg Oral BID WC  . enoxaparin (LOVENOX) injection  40 mg Subcutaneous Q24H  . fluticasone furoate-vilanterol  1 puff Inhalation Daily  . furosemide  20 mg Oral Daily  . gabapentin  100 mg Oral TID  . ipratropium-albuterol  3 mL Nebulization BID  . loratadine  10 mg Oral Daily  . losartan  25 mg Oral Daily  . memantine  10 mg Oral BID  . methylPREDNISolone (SOLU-MEDROL) injection  40 mg Intravenous Q12H  . pantoprazole  40 mg  Oral Daily  . potassium chloride SA  20 mEq Oral Daily  . pravastatin  80 mg Oral Daily  . raloxifene  60 mg Oral Daily  . sodium chloride flush  3 mL Intravenous Q12H  . trimethoprim-polymyxin b  2 drop Both Eyes Q4H   Continuous Infusions: . sodium chloride Stopped (12/13/18 1719)  . sodium chloride       LOS: 4 days   Time Spent in minutes   30 minutes  Jedrick Hutcherson D.O. on 12/17/2018 at 3:22 PM  Between 7am to 7pm - Please see pager noted on [email or web address]  After 7pm go to [email or web address]  And look for the night coverage person covering for me after hours  [facility] Office  [phone]

## 2018-12-17 NOTE — Discharge Instructions (Signed)
Chronic Obstructive Pulmonary Disease °Chronic obstructive pulmonary disease (COPD) is a long-term (chronic) lung problem. When you have COPD, it is hard for air to get in and out of your lungs. Usually the condition gets worse over time, and your lungs will never return to normal. There are things you can do to keep yourself as healthy as possible. °· Your doctor may treat your condition with: °? Medicines. °? Oxygen. °? Lung surgery. °· Your doctor may also recommend: °? Rehabilitation. This includes steps to make your body work better. It may involve a team of specialists. °? Quitting smoking, if you smoke. °? Exercise and changes to your diet. °? Comfort measures (palliative care). °Follow these instructions at home: °Medicines °· Take over-the-counter and prescription medicines only as told by your doctor. °· Talk to your doctor before taking any cough or allergy medicines. You may need to avoid medicines that cause your lungs to be dry. °Lifestyle °· If you smoke, stop. Smoking makes the problem worse. If you need help quitting, ask your doctor. °· Avoid being around things that make your breathing worse. This may include smoke, chemicals, and fumes. °· Stay active, but remember to rest as well. °· Learn and use tips on how to relax. °· Make sure you get enough sleep. Most adults need at least 7 hours of sleep every night. °· Eat healthy foods. Eat smaller meals more often. Rest before meals. °Controlled breathing °Learn and use tips on how to control your breathing as told by your doctor. Try: °· Breathing in (inhaling) through your nose for 1 second. Then, pucker your lips and breath out (exhale) through your lips for 2 seconds. °· Putting one hand on your belly (abdomen). Breathe in slowly through your nose for 1 second. Your hand on your belly should move out. Pucker your lips and breathe out slowly through your lips. Your hand on your belly should move in as you breathe out. ° °Controlled coughing °Learn  and use controlled coughing to clear mucus from your lungs. Follow these steps: °1. Lean your head a little forward. °2. Breathe in deeply. °3. Try to hold your breath for 3 seconds. °4. Keep your mouth slightly open while coughing 2 times. °5. Spit any mucus out into a tissue. °6. Rest and do the steps again 1 or 2 times as needed. °General instructions °· Make sure you get all the shots (vaccines) that your doctor recommends. Ask your doctor about a flu shot and a pneumonia shot. °· Use oxygen therapy and pulmonary rehabilitation if told by your doctor. If you need home oxygen therapy, ask your doctor if you should buy a tool to measure your oxygen level (oximeter). °· Make a COPD action plan with your doctor. This helps you to know what to do if you feel worse than usual. °· Manage any other conditions you have as told by your doctor. °· Avoid going outside when it is very hot, cold, or humid. °· Avoid people who have a sickness you can catch (contagious). °· Keep all follow-up visits as told by your doctor. This is important. °Contact a doctor if: °· You cough up more mucus than usual. °· There is a change in the color or thickness of the mucus. °· It is harder to breathe than usual. °· Your breathing is faster than usual. °· You have trouble sleeping. °· You need to use your medicines more often than usual. °· You have trouble doing your normal activities such as getting dressed   or walking around the house. °Get help right away if: °· You have shortness of breath while resting. °· You have shortness of breath that stops you from: °? Being able to talk. °? Doing normal activities. °· Your chest hurts for longer than 5 minutes. °· Your skin color is more blue than usual. °· Your pulse oximeter shows that you have low oxygen for longer than 5 minutes. °· You have a fever. °· You feel too tired to breathe normally. °Summary °· Chronic obstructive pulmonary disease (COPD) is a long-term lung problem. °· The way your  lungs work will never return to normal. Usually the condition gets worse over time. There are things you can do to keep yourself as healthy as possible. °· Take over-the-counter and prescription medicines only as told by your doctor. °· If you smoke, stop. Smoking makes the problem worse. °This information is not intended to replace advice given to you by your health care provider. Make sure you discuss any questions you have with your health care provider. °Document Released: 10/03/2007 Document Revised: 03/29/2017 Document Reviewed: 05/21/2016 °Elsevier Patient Education © 2020 [employer] ° °

## 2018-12-17 NOTE — Progress Notes (Addendum)
Pt on RA at rest= 96% O2 saturation.   Pt ambulated in the hallway on RA, 40 feet. O2 saturation stayed at 95% the whole time.    No supplemental O2 was needed.   Paulla Fore, RN

## 2018-12-17 NOTE — Evaluation (Signed)
Physical Therapy Evaluation Patient Details Name: [name] MRN: [id number] DOB: June 09, 1934 Today's Date: 12/17/2018   History of Present Illness  84 y.o. female was admitted with acute respiratory failure and hypoxia, with exacerbation of COPD and bronchitis, bradycardia.  Pt is Covid (-).   PMHx:  depression, HTN, cardiomyopathy, CHF, carotid stenosis, HLD, dementia, asthma, GERD, HA, elevated troponin, CAP, osteopenia, L BBB, EF 40-45%.    Clinical Impression  Pt was seen for gait on RW today, and was able to walk on room air with care for her pacing.  Became SOB with the effort just to wash hands at sink but O2 sat was 97%.  Walked on hallway with presat 99% and pulse 82, post sat 97% with pulse 116, and had a brief moment of sat to 85% which may have been gripping walker on the hallway.  Pt is on room air for entire session, and will continue to work with her on endurance and safety with monitoring of pulse and sats.    Follow Up Recommendations Home health PT;Supervision for mobility/OOB    Equipment Recommendations  Rolling walker with 5" wheels(if a sturdy walker is not home)    Recommendations for Other Services       Precautions / Restrictions Precautions Precautions: Fall Precaution Comments: monitor O2 sats with gait Restrictions Weight Bearing Restrictions: No      Mobility  Bed Mobility Overal bed mobility: Needs Assistance             General bed mobility comments: pt is sitting bedside when PT arrived  Transfers Overall transfer level: Needs assistance Equipment used: Rolling walker (2 wheeled);1 person hand held assist Transfers: Sit to/from [employer] Transfers Sit to Stand: Supervision Stand pivot transfers: Supervision       General transfer comment: pt is careful to use good hand placement  Ambulation/Gait Ambulation/Gait assistance: Min guard Gait Distance (Feet): 75 Feet Assistive device: Rolling walker (2 wheeled);1 person hand  held assist Gait Pattern/deviations: Step-through pattern;Decreased stride length;Trunk flexed;Narrow base of support Gait velocity: reduced, controlled Gait velocity interpretation: <1.31 ft/sec, indicative of household ambulator General Gait Details: pt is fairly safe to walk with RW but needs to be closely monitored for O2 sats, one short drop mid walk that returned to 95% with short stop and loosening grip on L hand   Stairs            Wheelchair Mobility    Modified Rankin (Stroke Patients Only)       Balance Overall balance assessment: Needs assistance Sitting-balance support: Feet supported Sitting balance-Leahy Scale: Good     Standing balance support: Bilateral upper extremity supported;During functional activity Standing balance-Leahy Scale: Fair                               Pertinent Vitals/Pain Pain Assessment: No/denies pain    Home Living Family/patient expects to be discharged to:: Private residence Living Arrangements: Children Available Help at Discharge: Family;Available PRN/intermittently Type of Home: House Home Access: Ramped entrance     Home Layout: One level Home Equipment: Walker - 2 wheels;Cane - single point;Shower seat Additional Comments: was on [facility] but now on RW    Prior Function Level of Independence: Independent with assistive device(s)         Comments: SPC typically     Hand Dominance   Dominant Hand: Right    Extremity/Trunk Assessment   Upper Extremity Assessment Upper Extremity  Assessment: Overall WFL for tasks assessed    Lower Extremity Assessment Lower Extremity Assessment: Generalized weakness    Cervical / Trunk Assessment Cervical / Trunk Assessment: Kyphotic  Communication   Communication: No difficulties  Cognition Arousal/Alertness: Awake/alert Behavior During Therapy: WFL for tasks assessed/performed Overall Cognitive Status: Within Functional Limits for tasks assessed                                  General Comments: pt is able to give history to PT       General Comments General comments (skin integrity, edema, etc.): pt is up to walk with RW today, typically uses SPC per her report but chart from last admit states used RW    Exercises     Assessment/Plan    PT Assessment Patient needs continued PT services  PT Problem List Decreased strength;Decreased range of motion;Decreased activity tolerance;Decreased balance;Decreased mobility;Decreased coordination;Decreased safety awareness;Cardiopulmonary status limiting activity       PT Treatment Interventions DME instruction;Gait training;Functional mobility training;Therapeutic activities;Therapeutic exercise;Balance training;Neuromuscular re-education;Patient/family education    PT Goals (Current goals can be found in the Care Plan section)  Acute Rehab PT Goals Patient Stated Goal: to get home and feel less SOB PT Goal Formulation: With patient Time For Goal Achievement: 12/31/18 Potential to Achieve Goals: Good    Frequency Min 3X/week   Barriers to discharge   has family and home access    Co-evaluation               AM-PAC PT "6 Clicks" Mobility  Outcome Measure Help needed turning from your back to your side while in a flat bed without using bedrails?: None Help needed moving from lying on your back to sitting on the side of a flat bed without using bedrails?: A Little Help needed moving to and from a bed to a chair (including a wheelchair)?: A Little Help needed standing up from a chair using your arms (e.g., wheelchair or bedside chair)?: A Little Help needed to walk in hospital room?: A Little Help needed climbing 3-5 steps with a railing? : A Little 6 Click Score: 19    End of Session Equipment Utilized During Treatment: Gait belt Activity Tolerance: Treatment limited secondary to medical complications (Comment)(brief drop on O2 sat to 85% but returned to baseline  ) Patient left: in bed;with call bell/phone within reach;with bed alarm set Nurse Communication: Mobility status PT Visit Diagnosis: Muscle weakness (generalized) (M62.81);Unsteadiness on feet (R26.81);Other (comment)(hypoxia)    Time: 0865-7846 PT Time Calculation (min) (ACUTE ONLY): 24 min   Charges:   PT Evaluation $PT Eval Moderate Complexity: 1 Mod PT Treatments $Gait Training: 8-22 mins       Ivar Drape 12/17/2018, 1:18 PM   Samul Dada, PT MS Acute Rehab Dept. Number: [facility] [id number] and [facility] [phone]

## 2018-12-17 NOTE — Discharge Summary (Signed)
Physician Discharge Summary  Gwendalyn EgeMarie S Roseland UJW:[id number]:[id number] DOB: 24-Mar-1935 DOA: 12/13/2018  PCP: Sonny Mastersakes, Linda M, FNP  Admit date: 12/13/2018 Discharge date: 12/18/2018  Time spent: 45 minutes  Recommendations for Outpatient Follow-up:  Patient will be discharged to home with home health physical therapy.  Patient will need to follow up with primary care provider within one week of discharge.  Patient should continue medications as prescribed.  Patient should follow a heart healthy diet.   Discharge Diagnoses:  Acute hypoxic respiratory failure secondary to COPD exacerbation/bronchitis Chronic combined systolic/diastolic heart failure/cardiomyopathy Essential hypertension Depression Hypercholesterolemia Bradycardia Chronic kidney disease, stage III  Discharge Condition: Stable  Diet recommendation: heart healthy  Filed Weights   12/14/18 0455 12/16/18 2112 12/17/18 2022  Weight: 72.5 kg 72.8 kg 74.9 kg    History of present illness:  On 12/13/2018 by Dr. Carron CurieAli Hijazi Baxter HireMarie Nadeau  is a 84 y.o. female, COPD, CAD, hypertension and history of cardiomyopathy with ejection fraction 40-45% presenting with 2 day history of worsening shortness of breath and cough.  Patient denies any chest pains, nausea, vomiting or diarrhea.  No fever or chills.  No headache, dizziness or loss of consciousness. In the emergency room her blood work was unremarkable and chest x-ray was negative for acute changes.  Hospital Course:  Acute hypoxic respiratory failure secondary to COPD exacerbation/bronchitis -Secondary to poor medication compliance as well as continued smoking  -Patient states that she has run out of her Brio for some weeks, unclear if she uses her home nebulizers as indicated -Patient currently on room air and maintaining oxygen saturations in the high 90s -Patient was started on Solu-Medrol as well as nebulizer treatments, will discharge with prednisone taper, Breo, nebs -Chest x-ray  reviewed and does not show any infiltrate.  Patient was on antibiotics earlier this admission, cefdinir was discontinued -Oxygen saturations with ambulation remained around 95% -PT consulted, recommended HH  Chronic combined systolic/diastolic heart failure/cardiomyopathy -Currently euvolemic and compensated -Echocardiogram 11/27/2017 shows an EF of 55 to 60%, grade 1 diastolic dysfunction.  Echo cardiogram in 2016 2017 showed an EF of 45 to 50%. -Continue Lasix, losartan, Coreg  Essential hypertension -Stable, continue Coreg, losartan  Depression -Continue bupropion  Hypercholesterolemia -Continue statin  Bradycardia -Appears to be transient event, rate now markedly well controlled  -EKG remarkable for left bundle branch block, elevated QTC (of note patient does have history of torsades per chart review)  Chronic kidney disease, stage III -Creatinine currently stable  Procedures: none  Consultations: None  Discharge Exam: Vitals:   12/18/18 0823 12/18/18 0910  BP:  (!) 147/71  Pulse: 72 72  Resp: 18 12  Temp:  98.3 F (36.8 C)  SpO2:  97%     General: Well developed, well nourished, NAD, appears stated age  HEENT: NCAT, mucous membranes moist.  Cardiovascular: S1 S2 auscultated, no rubs, murmurs or gallops. Regular rate and rhythm.  Respiratory: Clear to auscultation bilaterally with equal chest rise.  No wheezing  Abdomen: Soft, nontender, nondistended, + bowel sounds  Extremities: warm dry without cyanosis clubbing or edema  Neuro: AAOx3, nonfocal  Psych: Pleasant, appropriate mood and affect  Discharge Instructions Discharge Instructions    Discharge instructions   Complete by: As directed    Patient will be discharged to home with home health physical therapy.  Patient will need to follow up with primary care provider within one week of discharge.  Patient should continue medications as prescribed.  Patient should follow a heart healthy diet.  Allergies as of 12/18/2018      Reactions   Lisinopril-hydrochlorothiazide Swelling, Other (See Comments), Cough   Lip swelling and cough. Stopped by ENT. Patient is on Losartan without any issues    Codeine Itching, Swelling   eye irritation (prescribed percocet)   Penicillins Itching, Swelling   PATIENT HAS HAD A PCN REACTION WITH IMMEDIATE RASH, FACIAL/TONGUE/THROAT SWELLING, SOB, OR LIGHTHEADEDNESS WITH HYPOTENSION:  yes Has patient had a PCN reaction causing severe rash involving mucus membranes or skin necrosis: No Has patient had a PCN reaction that required hospitalization No Has patient had a PCN reaction occurring within the last 10 years: No eye irritation   Aricept [donepezil Hcl] Nausea And Vomiting      Medication List    STOP taking these medications   ondansetron 4 MG tablet Commonly known as: ZOFRAN   potassium chloride SA 20 MEQ tablet Commonly known as: K-DUR     TAKE these medications   AeroChamber MV inhaler Use as instructed   albuterol (2.5 MG/3ML) 0.083% nebulizer solution Commonly known as: PROVENTIL Take 2.5 mg by nebulization See admin instructions. Inhale 2.5 mg as via nebulizer twice daily, may also use every 6 hours as needed for shortness of breath/wheezing   albuterol 108 (90 Base) MCG/ACT inhaler Commonly known as: VENTOLIN HFA Inhale 1-2 puffs into the lungs every 6 (six) hours as needed for wheezing or shortness of breath.   aspirin EC 81 MG tablet Take 81 mg by mouth every evening.   Biotin 1000 MCG tablet Take 1,000 mcg by mouth every morning.   buPROPion 150 MG 12 hr tablet Commonly known as: WELLBUTRIN SR TAKE (1) TABLET TWICE A DAY. What changed:   how much to take  how to take this  when to take this  additional instructions   carvedilol 6.25 MG tablet Commonly known as: COREG TAKE  (1)  TABLET TWICE A DAY WITH MEALS (BREAKFAST AND SUPPER) What changed:   how much to take  how to take this  when to take this   additional instructions   fluticasone furoate-vilanterol 100-25 MCG/INH Aepb Commonly known as: BREO ELLIPTA Inhale 1 puff into the lungs daily.   furosemide 20 MG tablet Commonly known as: LASIX TAKE 1 TABLET ONCE DAILY   gabapentin 100 MG capsule Commonly known as: NEURONTIN Take 1 capsule (100 mg total) by mouth 3 (three) times daily.   loratadine 10 MG tablet Commonly known as: CLARITIN Take 1 tablet (10 mg total) by mouth daily.   losartan 50 MG tablet Commonly known as: COZAAR TAKE (1/2) TABLET DAILY. What changed:   how much to take  how to take this  when to take this  additional instructions   memantine 10 MG tablet Commonly known as: Namenda Take 1 tablet (10 mg total) by mouth 2 (two) times daily for 30 days.   multivitamin with minerals Tabs tablet Take 1 tablet by mouth daily. Centrum Silver   omeprazole 40 MG capsule Commonly known as: PRILOSEC Take 1 capsule (40 mg total) by mouth daily.   pravastatin 80 MG tablet Commonly known as: PRAVACHOL TAKE 1 TABLET DAILY What changed: when to take this   predniSONE 10 MG tablet Commonly known as: Deltasone Take in the morning. Take 4 tabs x 2 days, then 3 tabs x 2 days, then 2 tabs x 2 days, then 1 tab x 2 days   raloxifene 60 MG tablet Commonly known as: EVISTA Take 1 tablet (60 mg total) by mouth daily.  trimethoprim-polymyxin b ophthalmic solution Commonly known as: Polytrim Place 2 drops into both eyes every 4 (four) hours.   vitamin B-12 1000 MCG tablet Commonly known as: CYANOCOBALAMIN Take 1,000 mcg by mouth daily.      Allergies  Allergen Reactions  . Lisinopril-Hydrochlorothiazide Swelling, Other (See Comments) and Cough    Lip swelling and cough. Stopped by ENT. Patient is on Losartan without any issues   . Codeine Itching and Swelling    eye irritation (prescribed percocet)  . Penicillins Itching and Swelling    PATIENT HAS HAD A PCN REACTION WITH IMMEDIATE RASH,  FACIAL/TONGUE/THROAT SWELLING, SOB, OR LIGHTHEADEDNESS WITH HYPOTENSION:  yes Has patient had a PCN reaction causing severe rash involving mucus membranes or skin necrosis: No Has patient had a PCN reaction that required hospitalization No Has patient had a PCN reaction occurring within the last 10 years: No eye irritation  . Aricept [Donepezil Hcl] Nausea And Vomiting   Follow-up Information    Baruch Gouty, FNP. Schedule an appointment as soon as possible for a visit in 1 week(s).   Specialty: Family Medicine Why: Hospita follow up Contact information: [place] [address] [phone]        Minus Breeding, MD .   Specialty: Cardiology Contact information: [address] [place] [address] [phone]            The results of significant diagnostics from this hospitalization (including imaging, microbiology, ancillary and laboratory) are listed below for reference.    Significant Diagnostic Studies: Dg Chest Port 1 View  Result Date: 12/13/2018 CLINICAL DATA:  84 year old female with shortness of breath for 2 days EXAM: PORTABLE CHEST 1 VIEW COMPARISON:  Prior chest x-ray 10/04/2018 FINDINGS: Stable cardiomegaly. Atherosclerotic calcifications again noted in the transverse aorta. The lungs are clear save for minimal chronic bronchitic changes. Prior surgical changes of right shoulder arthroplasty. No acute osseous abnormality. IMPRESSION: Stable chest x-ray without evidence of acute cardiopulmonary process. Electronically Signed   By: Jacqulynn Cadet M.D.   On: 12/13/2018 13:14    Microbiology: Recent Results (from the past 240 hour(s))  SARS Coronavirus 2 [facility] order, Performed in [facility] hospital lab) Nasopharyngeal Nasopharyngeal Swab     Status: None   Collection Time: 12/13/18  3:08 PM   Specimen: Nasopharyngeal Swab  Result Value Ref Range Status   SARS Coronavirus 2 NEGATIVE NEGATIVE Final    Comment: (NOTE) If  result is NEGATIVE SARS-CoV-2 target nucleic acids are NOT DETECTED. The SARS-CoV-2 RNA is generally detectable in upper and lower  respiratory specimens during the acute phase of infection. The lowest  concentration of SARS-CoV-2 viral copies this assay can detect is 250  copies / mL. A negative result does not preclude SARS-CoV-2 infection  and should not be used as the sole basis for treatment or other  patient management decisions.  A negative result may occur with  improper specimen collection / handling, submission of specimen other  than nasopharyngeal swab, presence of viral mutation(s) within the  areas targeted by this assay, and inadequate number of viral copies  (<250 copies / mL). A negative result must be combined with clinical  observations, patient history, and epidemiological information. If result is POSITIVE SARS-CoV-2 target nucleic acids are DETECTED. The SARS-CoV-2 RNA is generally detectable in upper and lower  respiratory specimens dur ing the acute phase of infection.  Positive  results are indicative of active infection with SARS-CoV-2.  Clinical  correlation with patient history  and other diagnostic information is  necessary to determine patient infection status.  Positive results do  not rule out bacterial infection or co-infection with other viruses. If result is PRESUMPTIVE POSTIVE SARS-CoV-2 nucleic acids MAY BE PRESENT.   A presumptive positive result was obtained on the submitted specimen  and confirmed on repeat testing.  While 2019 novel coronavirus  (SARS-CoV-2) nucleic acids may be present in the submitted sample  additional confirmatory testing may be necessary for epidemiological  and / or clinical management purposes  to differentiate between  SARS-CoV-2 and other Sarbecovirus currently known to infect humans.  If clinically indicated additional testing with an alternate test  methodology [phone]) is advised. The SARS-CoV-2 RNA is generally   detectable in upper and lower respiratory sp ecimens during the acute  phase of infection. The expected result is Negative. Fact Sheet for Patients:  BoilerBrush.com.cy Fact Sheet for Healthcare Providers: https://pope.com/ This test is not yet approved or cleared by the [place] FDA and has been authorized for detection and/or diagnosis of SARS-CoV-2 by FDA under an Emergency Use Authorization (EUA).  This EUA will remain in effect (meaning this test can be used) for the duration of the COVID-19 declaration under Section 564(b)(1) of the Act, 21 U.S.C. section 360bbb-3(b)(1), unless the authorization is terminated or revoked sooner. Performed at [facility], [address], [place], [place] [address]      Labs: Basic Metabolic Panel: Recent Labs  Lab 12/14/18 0547 12/15/18 0711 12/16/18 0555 12/17/18 0626 12/18/18 0345  NA 137 142 142 142 141  K 4.1 4.1 4.0 3.9 4.2  CL 107 110 109 106 109  CO2 20* 23 25 27 23   GLUCOSE 196* 136* 110* 120* 127*  BUN 16 19 20  25* 28*  CREATININE 1.14* 1.15* 1.19* 1.24* 1.17*  CALCIUM 8.9 9.0 8.6* 8.7* 8.2*   Liver Function Tests: No results for input(s): AST, ALT, ALKPHOS, BILITOT, PROT, ALBUMIN in the last 168 hours. No results for input(s): LIPASE, AMYLASE in the last 168 hours. No results for input(s): AMMONIA in the last 168 hours. CBC: Recent Labs  Lab 12/13/18 1247 12/15/18 0711 12/16/18 0555 12/17/18 0626  WBC 6.4 18.9* 14.6* 14.7*  NEUTROABS 3.7  --   --   --   HGB 13.8 13.9 13.3 14.9  HCT 42.9 42.4 40.9 45.7  MCV 92.9 92.4 90.9 92.1  PLT 186 307 280 316   Cardiac Enzymes: No results for input(s): CKTOTAL, CKMB, CKMBINDEX, TROPONINI in the last 168 hours. BNP: BNP (last 3 results) Recent Labs    08/04/18 1425 10/04/18 2134 10/14/18 1137  BNP 191.2* 100.7* 168.7*    ProBNP (last 3 results) No results for input(s): PROBNP in the last 8760 hours.  CBG:  No results for input(s): GLUCAP in the last 168 hours.     Signed:  12/04/18  [facility] 12/18/2018, 9:15 AM

## 2018-12-18 LAB — BASIC METABOLIC PANEL
Anion gap: 9 (ref 5–15)
BUN: 28 mg/dL — ABNORMAL HIGH (ref 8–23)
CO2: 23 mmol/L (ref 22–32)
Calcium: 8.2 mg/dL — ABNORMAL LOW (ref 8.9–10.3)
Chloride: 109 mmol/L (ref 98–111)
Creatinine, Ser: 1.17 mg/dL — ABNORMAL HIGH (ref 0.44–1.00)
GFR calc Af Amer: 51 mL/min — ABNORMAL LOW (ref >60)
GFR calc non Af Amer: 44 mL/min — ABNORMAL LOW (ref >60)
Glucose, Bld: 127 mg/dL — ABNORMAL HIGH (ref 70–99)
Potassium: 4.2 mmol/L (ref 3.5–5.1)
Sodium: 141 mmol/L (ref 135–145)

## 2018-12-18 MED ORDER — PREDNISONE 10 MG PO TABS: ORAL_TABLET | ORAL | 0 refills | Status: DC

## 2018-12-18 MED ORDER — FLUTICASONE FUROATE-VILANTEROL 100-25 MCG/INH IN AEPB: 1.0000 | INHALATION_SPRAY | Freq: Every day | RESPIRATORY_TRACT | 0 refills | Status: DC

## 2018-12-18 MED ORDER — ALBUTEROL SULFATE HFA 108 (90 BASE) MCG/ACT IN AERS: 1.0000 | INHALATION_SPRAY | Freq: Four times a day (QID) | RESPIRATORY_TRACT | 0 refills | Status: DC | PRN

## 2018-12-18 NOTE — TOC Transition Note (Signed)
Transition of Care [facility]) - CM/SW Discharge Note   Patient Details  Name: [name] MRN: [id number] Date of Birth: 08-05-1934  [facility] CM/SW Contact:  Sharin Mons, RN Phone Number: 12/18/2018, 1:59 PM   Clinical Narrative:    Pt will transition to home today. Hx of COPD, CAD, hypertension and history of cardiomyopathy.    Final next level of care: [place] Barriers to Discharge: No Barriers Identified   Patient Goals and CMS Choice Patient states their goals for this hospitalization and ongoing recovery are:: to go home CMS Medicare.gov Compare Post Acute Care list provided to:: Patient Choice offered to / list presented to : Patient  Discharge Placement                       Discharge Plan and Services   Discharge Planning Services: CM Consult            DME Arranged: N/A(owns cane, walker) DME Agency: NA       HH Arranged: PT, OT HH Agency: [facility] Date [place]: 12/18/18 Time [place]: 7209 Representative spoke with at [place]: [place] ([place]) Interventions     Readmission Risk Interventions No flowsheet data found.

## 2018-12-18 NOTE — Plan of Care (Signed)
She is medically to be discharged.

## 2018-12-22 ENCOUNTER — Telehealth: Payer: Self-pay | Admitting: *Deleted

## 2018-12-22 NOTE — Telephone Encounter (Signed)
Was this the RX for COPD? She was prescribed this after discharge from the hospital for her COPD. If so, and she is still symptomatic, she should take.

## 2018-12-22 NOTE — Telephone Encounter (Signed)
Vm from [place] w/ [facility] Pt was Rxd Prednisone after surgery, it was delivered, has not taken and not able to find at home. Please advise if she should take, if so would need new Rx

## 2018-12-22 NOTE — Telephone Encounter (Signed)
Called and spoke with Magda Paganini, she states that pt seemed well today - only SOB with a lot of walking and exertion. She did not hear anything in lungs today. Will hold prednisone for now.

## 2018-12-24 ENCOUNTER — Other Ambulatory Visit: Payer: Self-pay | Admitting: *Deleted

## 2018-12-24 DIAGNOSIS — I10 Essential (primary) hypertension: Secondary | ICD-10-CM

## 2018-12-24 MED ORDER — LOSARTAN POTASSIUM 50 MG PO TABS: ORAL_TABLET | ORAL | 0 refills | Status: DC

## 2018-12-30 ENCOUNTER — Telehealth: Payer: Self-pay | Admitting: Family Medicine

## 2018-12-30 NOTE — Telephone Encounter (Signed)
No answer, mailbox full

## 2018-12-30 NOTE — Telephone Encounter (Signed)
Patient is requesting a letter stating because of her COPD and chronic cough she cannot wear a mask four hours at a time while she works.  Please advise.

## 2018-12-30 NOTE — Telephone Encounter (Signed)
Where is she working? What hours is she working?

## 2018-12-31 ENCOUNTER — Other Ambulatory Visit: Payer: Self-pay | Admitting: Family Medicine

## 2019-01-01 NOTE — Telephone Encounter (Signed)
She has been in the hospital multiple times this year because of her COPD. I feel she needs to refrain from working if the mask is causing difficulties with her COPD. I do not want her to risk getting sick or causing someone else to get sick. Is she willing to wear the mask and take frequent breaks so she can remove the mask for a few minutes?

## 2019-01-01 NOTE — Telephone Encounter (Signed)
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

## 2019-01-02 ENCOUNTER — Ambulatory Visit: Payer: Self-pay | Admitting: Licensed Clinical Social Worker

## 2019-01-02 DIAGNOSIS — F0390 Unspecified dementia without behavioral disturbance: Secondary | ICD-10-CM

## 2019-01-02 DIAGNOSIS — F03A0 Unspecified dementia, mild, without behavioral disturbance, psychotic disturbance, mood disturbance, and anxiety: Secondary | ICD-10-CM

## 2019-01-02 DIAGNOSIS — J449 Chronic obstructive pulmonary disease, unspecified: Secondary | ICD-10-CM

## 2019-01-02 DIAGNOSIS — I10 Essential (primary) hypertension: Secondary | ICD-10-CM

## 2019-01-02 DIAGNOSIS — F411 Generalized anxiety disorder: Secondary | ICD-10-CM

## 2019-01-02 DIAGNOSIS — F339 Major depressive disorder, recurrent, unspecified: Secondary | ICD-10-CM

## 2019-01-02 DIAGNOSIS — R0602 Shortness of breath: Secondary | ICD-10-CM

## 2019-01-02 NOTE — Chronic Care Management (AMB) (Signed)
  Care Management Note   [name] is a 84 y.o. year old female who is a primary care patient of [name], Connye Burkitt, FNP. The CM team was consulted for assistance with chronic disease management and care coordination.   I reached out to [name] by phone today.   [name] was given information about Chronic Care Management services today including:  1. CCM service includes personalized support from designated clinical staff supervised by her physician, including individualized plan of care and coordination with other care providers 2. 24/7 contact phone numbers for assistance for urgent and routine care needs. 3. Service will only be billed when office clinical staff spend 20 minutes or more in a month to coordinate care. 4. Only one practitioner may furnish and bill the service in a calendar month. 5. The patient may stop CCM services at any time (effective at the end of the month) by phone call to the office staff. 6. The patient will be responsible for cost sharing (co-pay) of up to 20% of the service fee (after annual deductible is met). Patient did not agree to services and wishes to consider information provided before deciding about enrollment in care management services.    Review of patient status, including review of consultants reports, relevant laboratory and other test results, and collaboration with appropriate care team members and the patient's provider was performed as part of comprehensive patient evaluation and provision of chronic care management services  Medications    albuterol (PROVENTIL) (2.5 MG/3ML) 0.083% nebulizer solution    albuterol (VENTOLIN HFA) 108 (90 Base) MCG/ACT inhaler    aspirin EC 81 MG tablet    Biotin 1000 MCG tablet    buPROPion (WELLBUTRIN SR) 150 MG 12 hr tablet    carvedilol (COREG) 6.25 MG tablet    fluticasone furoate-vilanterol (BREO ELLIPTA) 100-25 MCG/INH AEPB    furosemide (LASIX) 20 MG tablet    gabapentin (NEURONTIN) 100 MG  capsule    loratadine (CLARITIN) 10 MG tablet    losartan (COZAAR) 50 MG tablet    memantine (NAMENDA) 10 MG tablet(Expired)    Multiple Vitamin (MULTIVITAMIN WITH MINERALS) TABS tablet    omeprazole (PRILOSEC) 40 MG capsule    pravastatin (PRAVACHOL) 80 MG tablet    predniSONE (DELTASONE) 10 MG tablet    raloxifene (EVISTA) 60 MG tablet    Spacer/Aero-Holding Chambers (AEROCHAMBER MV) inhaler    trimethoprim-polymyxin b (POLYTRIM) ophthalmic solution    vitamin B-12 (CYANOCOBALAMIN) 1000 MCG tablet      LCSW talked via phone today with [name].  LCSW described CCM program support. LCSW talked with [name] about RNCM support with CCM program. LCSW talked with [name] about LCSW support with CCM program. [name] said she would think about program participation; she said she would also talk with her daughter, [name], about CCM program participation for client  Follow Up Plan: LCSW to call client in next 3 weeks to talk further with client about CCM program services.  [name].Emireth Cockerham MSW, LCSW Licensed Clinical Social Worker [facility] Care Management [phone]

## 2019-01-02 NOTE — Telephone Encounter (Signed)
Patient aware and verbalizes understanding. 

## 2019-01-02 NOTE — Patient Instructions (Addendum)
Licensed Clinical Social Worker Visit Information   I reached out to [name] by phone today.   [name] was given information about Chronic Care Management services today including:  1. CCM service includes personalized support from designated clinical staff supervised by her physician, including individualized plan of care and coordination with other care providers 2. 24/7 contact phone numbers for assistance for urgent and routine care needs. 3. Service will only be billed when office clinical staff spend 20 minutes or more in a month to coordinate care. 4. Only one practitioner may furnish and bill the service in a calendar month. 5. The patient may stop CCM services at any time (effective at the end of the month) by phone call to the office staff. 6. The patient will be responsible for cost sharing (co-pay) of up to 20% of the service fee (after annual deductible is met). Patient did not agree to services and wishes to consider information provided before deciding about enrollment in care management services.     Materials Provided: No  LCSW talked via phone today with [name].  LCSW described CCM program support. LCSW talked with [name] about RNCM support with CCM program. LCSW talked with [name] about LCSW support with CCM program. [name] said she would think about program participation; she said she would also talk with her daughter, [name], about CCM program participation for client   Follow Up Plan: LCSW to call client in next 3 weeks to talk further with client about CCM program services  The patient verbalized understanding of instructions provided today and declined a print copy of patient instruction materials.   [name].Damara Klunder MSW, LCSW Licensed Clinical Social Worker [facility] Care Management [phone]

## 2019-01-21 ENCOUNTER — Other Ambulatory Visit: Payer: Self-pay

## 2019-01-21 ENCOUNTER — Ambulatory Visit (INDEPENDENT_AMBULATORY_CARE_PROVIDER_SITE_OTHER): Payer: Medicare Other

## 2019-01-21 ENCOUNTER — Other Ambulatory Visit: Payer: Self-pay | Admitting: *Deleted

## 2019-01-21 DIAGNOSIS — Z7982 Long term (current) use of aspirin: Secondary | ICD-10-CM

## 2019-01-21 DIAGNOSIS — I429 Cardiomyopathy, unspecified: Secondary | ICD-10-CM

## 2019-01-21 DIAGNOSIS — I130 Hypertensive heart and chronic kidney disease with heart failure and stage 1 through stage 4 chronic kidney disease, or unspecified chronic kidney disease: Secondary | ICD-10-CM

## 2019-01-21 DIAGNOSIS — F17210 Nicotine dependence, cigarettes, uncomplicated: Secondary | ICD-10-CM

## 2019-01-21 DIAGNOSIS — F329 Major depressive disorder, single episode, unspecified: Secondary | ICD-10-CM

## 2019-01-21 DIAGNOSIS — J9601 Acute respiratory failure with hypoxia: Secondary | ICD-10-CM

## 2019-01-21 DIAGNOSIS — Z9981 Dependence on supplemental oxygen: Secondary | ICD-10-CM

## 2019-01-21 DIAGNOSIS — N183 Chronic kidney disease, stage 3 (moderate): Secondary | ICD-10-CM

## 2019-01-21 DIAGNOSIS — I2510 Atherosclerotic heart disease of native coronary artery without angina pectoris: Secondary | ICD-10-CM

## 2019-01-21 DIAGNOSIS — Z8679 Personal history of other diseases of the circulatory system: Secondary | ICD-10-CM

## 2019-01-21 DIAGNOSIS — J441 Chronic obstructive pulmonary disease with (acute) exacerbation: Secondary | ICD-10-CM | POA: Diagnosis not present

## 2019-01-21 DIAGNOSIS — Z7951 Long term (current) use of inhaled steroids: Secondary | ICD-10-CM

## 2019-01-21 DIAGNOSIS — I447 Left bundle-branch block, unspecified: Secondary | ICD-10-CM

## 2019-01-21 DIAGNOSIS — E7800 Pure hypercholesterolemia, unspecified: Secondary | ICD-10-CM

## 2019-01-21 DIAGNOSIS — I5042 Chronic combined systolic (congestive) and diastolic (congestive) heart failure: Secondary | ICD-10-CM

## 2019-01-21 DIAGNOSIS — R001 Bradycardia, unspecified: Secondary | ICD-10-CM

## 2019-01-21 MED ORDER — FLUTICASONE FUROATE-VILANTEROL 100-25 MCG/INH IN AEPB: 1.0000 | INHALATION_SPRAY | Freq: Every day | RESPIRATORY_TRACT | 0 refills | Status: DC

## 2019-01-22 ENCOUNTER — Other Ambulatory Visit: Payer: Self-pay

## 2019-01-22 ENCOUNTER — Other Ambulatory Visit: Payer: Self-pay | Admitting: *Deleted

## 2019-01-22 ENCOUNTER — Other Ambulatory Visit: Payer: Self-pay | Admitting: Family Medicine

## 2019-01-22 DIAGNOSIS — F03A0 Unspecified dementia, mild, without behavioral disturbance, psychotic disturbance, mood disturbance, and anxiety: Secondary | ICD-10-CM

## 2019-01-22 DIAGNOSIS — F0390 Unspecified dementia without behavioral disturbance: Secondary | ICD-10-CM

## 2019-01-22 MED ORDER — ALBUTEROL SULFATE (2.5 MG/3ML) 0.083% IN NEBU: 2.5000 mg | INHALATION_SOLUTION | RESPIRATORY_TRACT | 0 refills | Status: AC

## 2019-01-23 ENCOUNTER — Ambulatory Visit (INDEPENDENT_AMBULATORY_CARE_PROVIDER_SITE_OTHER): Payer: Medicare Other | Admitting: Family Medicine

## 2019-01-23 ENCOUNTER — Encounter: Payer: Self-pay | Admitting: Family Medicine

## 2019-01-23 VITALS — BP 155/81 | HR 68 | Temp 97.5°F | Resp 20 | Ht 64.0 in | Wt 158.6 lb

## 2019-01-23 DIAGNOSIS — R7309 Other abnormal glucose: Secondary | ICD-10-CM | POA: Diagnosis not present

## 2019-01-23 DIAGNOSIS — J418 Mixed simple and mucopurulent chronic bronchitis: Secondary | ICD-10-CM | POA: Diagnosis not present

## 2019-01-23 DIAGNOSIS — Z23 Encounter for immunization: Secondary | ICD-10-CM | POA: Diagnosis not present

## 2019-01-23 DIAGNOSIS — F0390 Unspecified dementia without behavioral disturbance: Secondary | ICD-10-CM | POA: Diagnosis not present

## 2019-01-23 DIAGNOSIS — I5032 Chronic diastolic (congestive) heart failure: Secondary | ICD-10-CM | POA: Diagnosis not present

## 2019-01-23 LAB — BAYER DCA HB A1C WAIVED: HB A1C (BAYER DCA - WAIVED): 5.9 % (ref <7.0)

## 2019-01-23 MED ORDER — ALBUTEROL SULFATE HFA 108 (90 BASE) MCG/ACT IN AERS: 1.0000 | INHALATION_SPRAY | Freq: Four times a day (QID) | RESPIRATORY_TRACT | 0 refills | Status: AC | PRN

## 2019-01-23 NOTE — Patient Instructions (Signed)
It was a pleasure seeing you today, [name].  Information regarding what we discussed is included in this packet.  Please make an appointment to see me in 3 months.   In a few days you may receive a survey in the mail or online from [employer] regarding your visit with [place] today. Please take a moment to fill this out. Your feedback is very important to our office. It can help [place] better understand your needs as well as improve your experience and satisfaction. Thank you for taking your time to complete it. We care about you.  Because of recent events of COVID-19 ("Coronavirus"), please follow CDC recommendations:   1. Wash your hand frequently 2. Avoid touching your face 3. Stay away from people who are sick 4. If you have symptoms such as fever, cough, shortness of breath then call your healthcare provider for further guidance 5. If you are sick, STAY AT HOME, unless otherwise directed by your healthcare provider. 6. Follow directions from state and national officials regarding staying safe    Please feel free to call our office if any questions or concerns arise.  Warm Regards, Monia Pouch, FNP-C [facility] [address] [place], [place] [address] [phone]

## 2019-01-24 LAB — CBC WITH DIFFERENTIAL/PLATELET
Basophils Absolute: 0.1 10*3/uL (ref 0.0–0.2)
Basos: 1 %
EOS (ABSOLUTE): 0.4 10*3/uL (ref 0.0–0.4)
Eos: 5 %
Hematocrit: 40.5 % (ref 34.0–46.6)
Hemoglobin: 13.5 g/dL (ref 11.1–15.9)
Immature Grans (Abs): 0 10*3/uL (ref 0.0–0.1)
Immature Granulocytes: 0 %
Lymphocytes Absolute: 2.2 10*3/uL (ref 0.7–3.1)
Lymphs: 29 %
MCH: 29.5 pg (ref 26.6–33.0)
MCHC: 33.3 g/dL (ref 31.5–35.7)
MCV: 88 fL (ref 79–97)
Monocytes Absolute: 0.5 10*3/uL (ref 0.1–0.9)
Monocytes: 6 %
Neutrophils Absolute: 4.6 10*3/uL (ref 1.4–7.0)
Neutrophils: 59 %
Platelets: 359 10*3/uL (ref 150–450)
RBC: 4.58 x10E6/uL (ref 3.77–5.28)
RDW: 13.4 % (ref 11.7–15.4)
WBC: 7.7 10*3/uL (ref 3.4–10.8)

## 2019-01-24 LAB — CMP14+EGFR
ALT: 15 IU/L (ref 0–32)
AST: 22 IU/L (ref 0–40)
Albumin/Globulin Ratio: 1.6 (ref 1.2–2.2)
Albumin: 3.9 g/dL (ref 3.6–4.6)
Alkaline Phosphatase: 103 IU/L (ref 39–117)
BUN/Creatinine Ratio: 17 (ref 12–28)
BUN: 19 mg/dL (ref 8–27)
Bilirubin Total: 0.2 mg/dL (ref 0.0–1.2)
CO2: 25 mmol/L (ref 20–29)
Calcium: 9.5 mg/dL (ref 8.7–10.3)
Chloride: 109 mmol/L — ABNORMAL HIGH (ref 96–106)
Creatinine, Ser: 1.14 mg/dL — ABNORMAL HIGH (ref 0.57–1.00)
GFR calc Af Amer: 52 mL/min/{1.73_m2} — ABNORMAL LOW (ref >59)
GFR calc non Af Amer: 45 mL/min/{1.73_m2} — ABNORMAL LOW (ref >59)
Globulin, Total: 2.4 g/dL (ref 1.5–4.5)
Glucose: 104 mg/dL — ABNORMAL HIGH (ref 65–99)
Potassium: 3.7 mmol/L (ref 3.5–5.2)
Sodium: 146 mmol/L — ABNORMAL HIGH (ref 134–144)
Total Protein: 6.3 g/dL (ref 6.0–8.5)

## 2019-01-24 NOTE — Progress Notes (Signed)
Subjective:  Patient ID: [name], female    DOB: 01/04/1935, 84 y.o.   MRN: [id number]  Patient Care Team: Baruch Gouty, FNP as PCP - General (Family Medicine) Minus Breeding, MD as PCP - Cardiology (Cardiology) Shea Evans [name], LCSW as Social Worker (Licensed Clinical Social Worker)   Chief Complaint:  Medical Management of Chronic Issues (burning on scalp)   HPI: [name] is a 84 y.o. female presenting on 01/23/2019 for Medical Management of Chronic Issues (burning on scalp)  Pt was on the phone with her daughter during the visit. Daughter expressed concerns that pt was not taking her medications as prescribed as she finds extra pill packs when they should all be gone. States this has been ongoing for a while. Daughter states when her mother comes to [place] to stay with her she takes her medications as she is supposed to and she does very well. Pt reports she takes her medications like she is supposed to and is not sure what her daughter is talking about.   1. Dementia without behavioral disturbance, unspecified dementia type [facility])  Daughter feels symptoms are worsening and that pt is now forgetting to take her medications. States the pts symptoms have been steadily increasing over the last several months. Pt states she is able to manage fine.    2. Mixed simple and mucopurulent chronic bronchitis (HCC)  Does have exertional shortness of breath. No fatigue, increased sputum production, increased cough, fever, chills, weakness, or fatigue.    3. Elevated glucose  Last several blood sugars have been elevated. Will check A1C to see if pt has type 2 diabetes. Pt denies polyuria, polyphagia, ond polydipsia.    4. Chronic diastolic heart failure (HCC)  Symptoms well controlled. No weigh changes or fatigue. No PND or orthopnea. Followed by cardiology.      Relevant past medical, surgical, family, and social history reviewed and updated as indicated.  Allergies and  medications reviewed and updated. Date reviewed: Chart in Epic.   Past Medical History:  Diagnosis Date   Allergic rhinitis    Anxiety    Arthritis    Asthma    CAP (community acquired pneumonia) 11/26/2017   COPD (chronic obstructive pulmonary disease) ([place])    Depression    Dyspnea    with exertion    Elevated troponin 04/05/2016   Essential hypertension, benign    GERD (gastroesophageal reflux disease)    Headache    occiptal neuralgia - left    Heart murmur    History of blood transfusion    "related to miscarriage"   Left bundle branch block    in & out of A fib   Memory loss    Mixed hyperlipidemia    Occipital neuralgia of left side    Osteopenia    Secondary cardiomyopathy ([place])    LVEF 40-45%, likely nonischemic   Urinary urgency     Past Surgical History:  Procedure Laterality Date   APPENDECTOMY     BACK SURGERY     CARDIAC CATHETERIZATION  2010   Normal coronaries   CATARACT EXTRACTION W/PHACO  04/16/2011   Procedure: CATARACT EXTRACTION PHACO AND INTRAOCULAR LENS PLACEMENT ([place]);  Surgeon: Williams Che;  Location: [facility];  Service: Ophthalmology;  Laterality: Left;  CDE=11.35   CATARACT EXTRACTION W/PHACO  01/28/2012   Procedure: CATARACT EXTRACTION PHACO AND INTRAOCULAR LENS PLACEMENT ([facility]);  Surgeon: Williams Che, MD;  Location: [facility];  Service: Ophthalmology;  Laterality:  Right;  CDI:8.15   CYSTO WITH HYDRODISTENSION  05/29/2012   Procedure: CYSTOSCOPY/HYDRODISTENSION;  Surgeon: Reece Packer, MD;  Location: [facility];  Service: Urology;  Laterality: N/A;  INSTILLATION OF MARCAINE AND PYRIDIUM    DILATION AND CURETTAGE OF UTERUS  <hysterectomy"   EYE SURGERY     HAMMER TOE SURGERY Bilateral 11/22/11   [place] [place]   JOINT REPLACEMENT     LAPAROSCOPIC CHOLECYSTECTOMY  1991   LUMBAR LAMINECTOMY  2011   LUMBAR LAMINECTOMY/DECOMPRESSION MICRODISCECTOMY Left 06/06/2012   Procedure: LUMBAR  LAMINECTOMY/DECOMPRESSION MICRODISCECTOMY 1 LEVEL;  Surgeon: Erline Levine, MD;  Location: [place] [facility];  Service: Neurosurgery;  Laterality: Left;  Left Lumbar two-three Laminectomy for resection of synovial cyst   SHOULDER ARTHROSCOPY Right 02/12/2018   SHOULDER ARTHROSCOPY, BICEPS DEBRIDEMENT, REMOVAL OF LOOSE BODIES   SHOULDER ARTHROSCOPY Right 02/12/2018   Procedure: RIGHT SHOULDER ARTHROSCOPY, BICEPS DEBRIDEMENT, REMOVAL OF LOOSE BODIES;  Surgeon: Marybelle Killings, MD;  Location: [place];  Service: Orthopedics;  Laterality: Right;   SHOULDER OPEN ROTATOR CUFF REPAIR Right 2011   TONSILLECTOMY     TOTAL HIP ARTHROPLASTY Right 03/13/2013   Procedure: TOTAL HIP ARTHROPLASTY;  Surgeon: Kerin Salen, MD;  Location: [place];  Service: Orthopedics;  Laterality: Right;   TOTAL SHOULDER ARTHROPLASTY Right 07/02/2018   Procedure: RIGHT TOTAL SHOULDER ARTHROPLASTY;  Surgeon: Marybelle Killings, MD;  Location: [place];  Service: Orthopedics;  Laterality: Right;   VAGINAL HYSTERECTOMY      Social History   Socioeconomic History   Marital status: Widowed    Spouse name: Not on file   Number of children: 5   Years of education: 8th grade   Highest education level: Not on file  Occupational History   Occupation: Worked in Holiday representative for 30 years    Employer: RETIRED    Comment: Retired   Occupation: Part time care giver  Social Designer, fashion/clothing strain: Somewhat hard   Food insecurity    Worry: Never true    Inability: Never true   Transportation needs    Medical: No    Non-medical: No  Tobacco Use   Smoking status: Former Smoker    Years: 60.00    Types: Cigarettes    Quit date: 09/12/2018    Years since quitting: 0.3   Smokeless tobacco: Never Used  Substance and Sexual Activity   Alcohol use: Not Currently    Comment: 11/27/2017 "drank some in my 20s"   Drug use: No   Sexual activity: Not Currently  Lifestyle   Physical activity    Days per week: 0  days    Minutes per session: 0 min   Stress: Only a little  Relationships   Social connections    Talks on phone: More than three times a week    Gets together: More than three times a week    Attends religious service: More than 4 times per year    Active member of club or organization: Yes    Attends meetings of clubs or organizations: More than 4 times per year    Relationship status: Widowed   Intimate partner violence    Fear of current or ex partner: No    Emotionally abused: No    Physically abused: No    Forced sexual activity: No  Other Topics Concern   Not on file  Social History Narrative   Ms Fedrick is divorced. Her adult son lives with her and he  is disabled due to bilateral AKA due to diabetes.  She helps to provide care to him. She has three adult daughters that live in the [place] area. She talks with them daily and they come to visit once or twice a year. She has 7 grandchildren also.     Outpatient Encounter Medications as of 01/23/2019  Medication Sig   albuterol (PROVENTIL) (2.5 MG/3ML) 0.083% nebulizer solution Take 3 mLs (2.5 mg total) by nebulization See admin instructions. Inhale 2.5 mg as via nebulizer twice daily, may also use every 6 hours as needed for shortness of breath/wheezing   albuterol (VENTOLIN HFA) 108 (90 Base) MCG/ACT inhaler Inhale 1-2 puffs into the lungs every 6 (six) hours as needed for wheezing or shortness of breath.   aspirin EC 81 MG tablet Take 81 mg by mouth every evening.    Biotin 1000 MCG tablet Take 1,000 mcg by mouth every morning.    buPROPion (WELLBUTRIN SR) 150 MG 12 hr tablet TAKE (1) TABLET TWICE A DAY. (Patient taking differently: Take 150 mg by mouth daily. )   carvedilol (COREG) 6.25 MG tablet TAKE  (1)  TABLET TWICE A DAY WITH MEALS (BREAKFAST AND SUPPER) (Patient taking differently: Take 6.25 mg by mouth 2 (two) times daily with a meal. )   fluticasone furoate-vilanterol (BREO ELLIPTA) 100-25 MCG/INH AEPB Inhale 1  puff into the lungs daily.   furosemide (LASIX) 20 MG tablet TAKE 1 TABLET ONCE DAILY (Patient taking differently: Take 20 mg by mouth daily. )   gabapentin (NEURONTIN) 100 MG capsule Take 1 capsule (100 mg total) by mouth 3 (three) times daily.   loratadine (CLARITIN) 10 MG tablet Take 1 tablet (10 mg total) by mouth daily.   losartan (COZAAR) 50 MG tablet TAKE (1/2) TABLET DAILY.   memantine (NAMENDA) 10 MG tablet TAKE  (1)  TABLET TWICE A DAY.   Multiple Vitamin (MULTIVITAMIN WITH MINERALS) TABS tablet Take 1 tablet by mouth daily. Centrum Silver   omeprazole (PRILOSEC) 40 MG capsule Take 1 capsule (40 mg total) by mouth daily.   pravastatin (PRAVACHOL) 80 MG tablet TAKE 1 TABLET DAILY (Patient taking differently: Take 80 mg by mouth at bedtime. )   raloxifene (EVISTA) 60 MG tablet TAKE 1 TABLET DAILY   Spacer/Aero-Holding Chambers (AEROCHAMBER MV) inhaler Use as instructed   trimethoprim-polymyxin b (POLYTRIM) ophthalmic solution Place 2 drops into both eyes every 4 (four) hours.   vitamin B-12 (CYANOCOBALAMIN) 1000 MCG tablet Take 1,000 mcg by mouth daily.   [DISCONTINUED] albuterol (VENTOLIN HFA) 108 (90 Base) MCG/ACT inhaler Inhale 1-2 puffs into the lungs every 6 (six) hours as needed for wheezing or shortness of breath.   [DISCONTINUED] predniSONE (DELTASONE) 10 MG tablet Take in the morning. Take 4 tabs x 2 days, then 3 tabs x 2 days, then 2 tabs x 2 days, then 1 tab x 2 days (Patient not taking: Reported on 01/23/2019)   No facility-administered encounter medications on file as of 01/23/2019.     Allergies  Allergen Reactions   Lisinopril-Hydrochlorothiazide Swelling, Other (See Comments) and Cough    Lip swelling and cough. Stopped by ENT. Patient is on Losartan without any issues    Codeine Itching and Swelling    eye irritation (prescribed percocet)   Penicillins Itching and Swelling    PATIENT HAS HAD A PCN REACTION WITH IMMEDIATE RASH, FACIAL/TONGUE/THROAT  SWELLING, SOB, OR LIGHTHEADEDNESS WITH HYPOTENSION:  yes Has patient had a PCN reaction causing severe rash involving mucus membranes or skin necrosis:  No Has patient had a PCN reaction that required hospitalization No Has patient had a PCN reaction occurring within the last 10 years: No eye irritation   Aricept [Donepezil Hcl] Nausea And Vomiting    Review of Systems  Constitutional: Negative for activity change, appetite change, chills, fever and unexpected weight change.  HENT: Negative.   Eyes: Negative.  Negative for photophobia and visual disturbance.  Respiratory: Positive for shortness of breath. Negative for cough, choking, chest tightness and wheezing.   Cardiovascular: Negative for chest pain, palpitations and leg swelling.  Gastrointestinal: Negative for abdominal distention, anal bleeding, blood in stool, constipation, diarrhea, nausea, rectal pain and vomiting.  Endocrine: Negative.  Negative for cold intolerance, heat intolerance, polydipsia and polyphagia.  Genitourinary: Negative for decreased urine volume, difficulty urinating, dysuria, frequency and urgency.  Musculoskeletal: Negative for arthralgias and myalgias.  Skin: Negative.  Negative for color change and pallor.  Allergic/Immunologic: Negative.   Neurological: Negative for dizziness, tremors, seizures, syncope, speech difficulty, weakness, light-headedness, numbness and headaches.  Hematological: Negative.  Does not bruise/bleed easily.  Psychiatric/Behavioral: Positive for confusion (baseline). Negative for behavioral problems, hallucinations, self-injury, sleep disturbance and suicidal ideas. The patient is not nervous/anxious.   All other systems reviewed and are negative.       Objective:  BP (!) 155/81    Pulse 68    Temp (!) 97.5 F (36.4 C) (Temporal)    Resp 20    Ht 5' 4"  (1.626 m)    Wt 158 lb 9.6 oz (71.9 kg)    SpO2 97%    BMI 27.22 kg/m    Wt Readings from Last 3 Encounters:  01/23/19 158 lb  9.6 oz (71.9 kg)  12/17/18 165 lb 1.6 oz (74.9 kg)  11/27/18 161 lb 12.8 oz (73.4 kg)    Physical Exam Vitals signs and nursing note reviewed.  Constitutional:      General: She is not in acute distress.    Appearance: Normal appearance. She is well-developed, well-groomed and overweight. She is not ill-appearing, toxic-appearing or diaphoretic.  HENT:     Head: Normocephalic and atraumatic.     Jaw: There is normal jaw occlusion.     Right Ear: Hearing, tympanic membrane, ear canal and external ear normal.     Left Ear: Hearing, tympanic membrane, ear canal and external ear normal.     Nose: Nose normal.     Mouth/Throat:     Lips: Pink.     Mouth: Mucous membranes are moist.     Pharynx: Oropharynx is clear. Uvula midline.  Eyes:     General: Lids are normal.     Extraocular Movements: Extraocular movements intact.     Conjunctiva/sclera: Conjunctivae normal.     Pupils: Pupils are equal, round, and reactive to light.  Neck:     Musculoskeletal: Normal range of motion and neck supple.     Thyroid: No thyroid mass, thyromegaly or thyroid tenderness.     Vascular: No carotid bruit or JVD.     Trachea: Trachea and phonation normal.  Cardiovascular:     Rate and Rhythm: Normal rate and regular rhythm.     Chest Wall: PMI is not displaced.     Pulses: Normal pulses.     Heart sounds: Normal heart sounds. No murmur. No friction rub. No gallop.   Pulmonary:     Effort: Pulmonary effort is normal. No respiratory distress.     Breath sounds: Normal breath sounds. No wheezing.  Abdominal:  General: Bowel sounds are normal. There is no distension or abdominal bruit.     Palpations: Abdomen is soft. There is no hepatomegaly or splenomegaly.     Tenderness: There is no abdominal tenderness. There is no right CVA tenderness or left CVA tenderness.     Hernia: No hernia is present.  Musculoskeletal: Normal range of motion.     Right lower leg: No edema.     Left lower leg: No  edema.  Lymphadenopathy:     Cervical: No cervical adenopathy.  Skin:    General: Skin is warm and dry.     Capillary Refill: Capillary refill takes less than 2 seconds.     Coloration: Skin is not cyanotic, jaundiced or pale.     Findings: No rash.  Neurological:     General: No focal deficit present.     Mental Status: She is alert. Mental status is at baseline.     Cranial Nerves: Cranial nerves are intact.     Sensory: Sensation is intact.     Coordination: Coordination is intact.     Gait: Gait is intact.     Deep Tendon Reflexes: Reflexes are normal and symmetric.  Psychiatric:        Attention and Perception: Attention and perception normal.        Mood and Affect: Mood and affect normal.        Speech: Speech normal.        Behavior: Behavior normal. Behavior is cooperative.        Thought Content: Thought content normal.        Cognition and Memory: Cognition and memory normal.        Judgment: Judgment normal.     Results for orders placed or performed in visit on 01/23/19  CMP14+EGFR  Result Value Ref Range   Glucose 104 (H) 65 - 99 mg/dL   BUN 19 8 - 27 mg/dL   Creatinine, Ser 1.14 (H) 0.57 - 1.00 mg/dL   GFR calc non Af Amer 45 (L) >59 mL/min/1.73   GFR calc Af Amer 52 (L) >59 mL/min/1.73   BUN/Creatinine Ratio 17 12 - 28   Sodium 146 (H) 134 - 144 mmol/L   Potassium 3.7 3.5 - 5.2 mmol/L   Chloride 109 (H) 96 - 106 mmol/L   CO2 25 20 - 29 mmol/L   Calcium 9.5 8.7 - 10.3 mg/dL   Total Protein 6.3 6.0 - 8.5 g/dL   Albumin 3.9 3.6 - 4.6 g/dL   Globulin, Total 2.4 1.5 - 4.5 g/dL   Albumin/Globulin Ratio 1.6 1.2 - 2.2   Bilirubin Total 0.2 0.0 - 1.2 mg/dL   Alkaline Phosphatase 103 39 - 117 IU/L   AST 22 0 - 40 IU/L   ALT 15 0 - 32 IU/L  CBC with Differential/Platelet  Result Value Ref Range   WBC 7.7 3.4 - 10.8 x10E3/uL   RBC 4.58 3.77 - 5.28 x10E6/uL   Hemoglobin 13.5 11.1 - 15.9 g/dL   Hematocrit 40.5 34.0 - 46.6 %   MCV 88 79 - 97 fL   MCH 29.5 26.6  - 33.0 pg   MCHC 33.3 31.5 - 35.7 g/dL   RDW 13.4 11.7 - 15.4 %   Platelets 359 150 - 450 x10E3/uL   Neutrophils 59 Not Estab. %   Lymphs 29 Not Estab. %   Monocytes 6 Not Estab. %   Eos 5 Not Estab. %   Basos 1 Not Estab. %   Neutrophils  Absolute 4.6 1.4 - 7.0 x10E3/uL   Lymphocytes Absolute 2.2 0.7 - 3.1 x10E3/uL   Monocytes Absolute 0.5 0.1 - 0.9 x10E3/uL   EOS (ABSOLUTE) 0.4 0.0 - 0.4 x10E3/uL   Basophils Absolute 0.1 0.0 - 0.2 x10E3/uL   Immature Granulocytes 0 Not Estab. %   Immature Grans (Abs) 0.0 0.0 - 0.1 x10E3/uL  Bayer DCA Hb A1c Waived  Result Value Ref Range   HB A1C (BAYER DCA - WAIVED) 5.9 <7.0 %       Pertinent labs & imaging results that were available during my care of the patient were reviewed by me and considered in my medical decision making.  Assessment & Plan:  Stephane was seen today for medical management of chronic issues.  Diagnoses and all orders for this visit:  Dementia without behavioral disturbance, unspecified dementia type [facility]) Daughter feels dementia symptoms are worsening and that pt is not taking her medications as prescribed. Pt feels she is able to manage fine. Due to concerns of daughter and increasing dementia, will place referral to home health for medication management.  -     CMP14+EGFR -     CBC with Differential/Platelet -     Ambulatory referral to [place]  Mixed simple and mucopurulent chronic bronchitis (HCC) Pt continues to have exertional fatigue but is not using her daily controller medication as prescribed. Pt aware to only use the ventolin inhaler as needed and to mae sure she is using the [facility] as prescribed. Pt does have exertional shortness of breath. Denies resting shortness of breath.  -     albuterol (VENTOLIN HFA) 108 (90 Base) MCG/ACT inhaler; Inhale 1-2 puffs into the lungs every 6 (six) hours as needed for wheezing or shortness of breath.  Elevated glucose Previous blood sugars have been elevated, most above  115. Diet and exercise discussed. A1C in office today indicated prediabetes, 5.9. Pt aware and knows of lifestyle changes to avoid progression to diabetes.  -     CMP14+EGFR -     Bayer DCA Hb A1c Waived  Chronic diastolic heart failure ([place]) Followed by cardiology. No changes in weight. No significant lower extremity swelling. Does have exertional fatigue. No PND or orthopnea.  -     CMP14+EGFR -     CBC with Differential/Platelet  Need for immunization against influenza -     Flu Vaccine QUAD High Dose(Fluad)     Continue all other maintenance medications.  Follow up plan: Return in about 3 months (around 04/24/2019), or if symptoms worsen or fail to improve.  Continue healthy lifestyle choices, including diet (rich in fruits, vegetables, and lean proteins, and low in salt and simple carbohydrates) and exercise (at least 30 minutes of moderate physical activity daily).  Educational handout given for survey, COVID-19  The above assessment and management plan was discussed with the patient. The patient verbalized understanding of and has agreed to the management plan. Patient is aware to call the clinic if they develop any new symptoms or if symptoms persist or worsen. Patient is aware when to return to the clinic for a follow-up visit. Patient educated on when it is appropriate to go to the emergency department.   Monia Pouch, FNP-C [facility] [phone]

## 2019-01-28 ENCOUNTER — Ambulatory Visit: Payer: Self-pay | Admitting: Licensed Clinical Social Worker

## 2019-01-28 DIAGNOSIS — F339 Major depressive disorder, recurrent, unspecified: Secondary | ICD-10-CM

## 2019-01-28 DIAGNOSIS — I10 Essential (primary) hypertension: Secondary | ICD-10-CM

## 2019-01-28 DIAGNOSIS — F411 Generalized anxiety disorder: Secondary | ICD-10-CM

## 2019-01-28 DIAGNOSIS — F0390 Unspecified dementia without behavioral disturbance: Secondary | ICD-10-CM

## 2019-01-28 DIAGNOSIS — F03A0 Unspecified dementia, mild, without behavioral disturbance, psychotic disturbance, mood disturbance, and anxiety: Secondary | ICD-10-CM

## 2019-01-28 DIAGNOSIS — R0602 Shortness of breath: Secondary | ICD-10-CM

## 2019-01-28 NOTE — Patient Instructions (Addendum)
Licensed Clinical Water engineer Provided: No   I reached out to [name] by phone today.   [name] was given information about Chronic Care Management services today including:  1. CCM service includes personalized support from designated clinical staff supervised by her physician, including individualized plan of care and coordination with other care providers 2. 24/7 contact phone numbers for assistance for urgent and routine care needs. 3. Service will only be billed when office clinical staff spend 20 minutes or more in a month to coordinate care. 4. Only one practitioner may furnish and bill the service in a calendar month. 5. The patient may stop CCM services at any time (effective at the end of the month) by phone call to the office staff. 6. The patient will be responsible for cost sharing (co-pay) of up to 20% of the service fee (after annual deductible is met).   Patient did not agree to services and wishes to consider information provided before deciding about enrollment in care management services.   LCSW talked via phone today with [name]. LCSW described CCM program support. LCSW talked with [name] about RNCM support with CCM program. LCSW talked with [name] about LCSW support with CCM program. [name] said she would think about program participation; she said she would also talk with her daughter, [name], about CCM program participation for client   Follow Up Plan: LCSW to call client in next 3 weeks to talk with client further about CCM program  services available   The patient verbalized understanding of instructions provided today and declined a print copy of patient instruction materials.   [name].Hrithik Boschee MSW, LCSW Licensed Clinical Social Worker [facility] [phone]

## 2019-01-28 NOTE — Chronic Care Management (AMB) (Signed)
  Care Management Note   [name] is a 83 y.o. year old female who is a primary care patient of [name]. The CM team was consulted for assistance with chronic disease management and care coordination.   I reached out to [name] by phone today.   [name] was given information about Chronic Care Management services today including:  1. CCM service includes personalized support from designated clinical staff supervised by her physician, including individualized plan of care and coordination with other care providers 2. 24/7 contact phone numbers for assistance for urgent and routine care needs. 3. Service will only be billed when office clinical staff spend 20 minutes or more in a month to coordinate care. 4. Only one practitioner may furnish and bill the service in a calendar month. 5. The patient may stop CCM services at any time (effective at the end of the month) by phone call to the office staff. 6. The patient will be responsible for cost sharing (co-pay) of up to 20% of the service fee (after annual deductible is met). Patient did not agree to services and wishes to consider information provided before deciding about enrollment in care management services.    Review of patient status, including review of consultants reports, relevant laboratory and other test results, and collaboration with appropriate care team members and the patient'[name] provider was performed as part of comprehensive patient evaluation and provision of chronic care management services  Medications    albuterol (PROVENTIL) (2.5 MG/3ML) 0.083% nebulizer solution    albuterol (VENTOLIN HFA) 108 (90 Base) MCG/ACT inhaler    aspirin EC 81 MG tablet    Biotin 1000 MCG tablet    buPROPion (WELLBUTRIN SR) 150 MG 12 hr tablet    carvedilol (COREG) 6.25 MG tablet    fluticasone furoate-vilanterol (BREO ELLIPTA) 100-25 MCG/INH AEPB    furosemide (LASIX) 20 MG tablet    gabapentin (NEURONTIN) 100 MG  capsule    loratadine (CLARITIN) 10 MG tablet    losartan (COZAAR) 50 MG tablet    memantine (NAMENDA) 10 MG tablet(Expired)    Multiple Vitamin (MULTIVITAMIN WITH MINERALS) TABS tablet    omeprazole (PRILOSEC) 40 MG capsule    pravastatin (PRAVACHOL) 80 MG tablet    predniSONE (DELTASONE) 10 MG tablet    raloxifene (EVISTA) 60 MG tablet    Spacer/Aero-Holding Chambers (AEROCHAMBER MV) inhaler    trimethoprim-polymyxin b (POLYTRIM) ophthalmic solution    vitamin B-12 (CYANOCOBALAMIN) 1000 MCG tablet      [name] talked via phone today with [name].  [name] described CCM program support. [name] talked with [name] about RNCM support with CCM program. [name] talked with [name] about [name] support with CCM program. [name] said she would think about program participation; she said she would also talk with her daughter, [name], about CCM program participation for client  Follow Up Plan: [name] to call client in next 3 weeks to talk further with client about CCM program services.  [name].[name] MSW, [name] Licensed Clinical Social Worker [facility] [phone] 

## 2019-01-29 ENCOUNTER — Telehealth: Payer: Self-pay | Admitting: Family Medicine

## 2019-02-02 ENCOUNTER — Ambulatory Visit (INDEPENDENT_AMBULATORY_CARE_PROVIDER_SITE_OTHER): Payer: Medicare Other | Admitting: Family Medicine

## 2019-02-02 ENCOUNTER — Encounter: Payer: Self-pay | Admitting: Family Medicine

## 2019-02-02 ENCOUNTER — Other Ambulatory Visit: Payer: Self-pay

## 2019-02-02 DIAGNOSIS — R42 Dizziness and giddiness: Secondary | ICD-10-CM | POA: Diagnosis not present

## 2019-02-02 DIAGNOSIS — R0602 Shortness of breath: Secondary | ICD-10-CM | POA: Diagnosis not present

## 2019-02-02 DIAGNOSIS — R079 Chest pain, unspecified: Secondary | ICD-10-CM

## 2019-02-02 DIAGNOSIS — R531 Weakness: Secondary | ICD-10-CM

## 2019-02-02 NOTE — Progress Notes (Signed)
Virtual Visit via telephone Note Due to COVID-19 pandemic this visit was conducted virtually. This visit type was conducted due to national recommendations for restrictions regarding the COVID-19 Pandemic (e.g. social distancing, sheltering in place) in an effort to limit this patient's exposure and mitigate transmission in our community. All issues noted in this document were discussed and addressed.  A physical exam was not performed with this format.   I connected with [name] on 02/02/19 at 1120 by telephone and verified that I am speaking with the correct person using two identifiers. [name] is currently located at home and no one is currently with them during visit. The provider, Kari Baars, FNP is located in their office at time of visit.  I discussed the limitations, risks, security and privacy concerns of performing an evaluation and management service by telephone and the availability of in person appointments. I also discussed with the patient that there may be a patient responsible charge related to this service. The patient expressed understanding and agreed to proceed.  Subjective:  Patient ID: [name], female    DOB: 11-14-1934, 84 y.o.   MRN: [id number]  Chief Complaint:  Fatigue, Shortness of Breath, Dizziness, and Chest Pain   HPI: [name] is a 84 y.o. female presenting on 02/02/2019 for Fatigue, Shortness of Breath, Dizziness, and Chest Pain   Pt reports general weakness, shortness of breath, fatigue, dizziness, and intermittent chest pain. Pt states this started on Saturday and is not getting better. Pt states the shortness of breath is constant. States she just feels tired and weak all over. No fever or chills. States she has dizziness when she stands or changes positions quickly. States on Saturday she had chest pain that lasted for a few minutes and then subsided. States this pain will come and go. No radiation of pain. No palpitations or  syncope. She denies pain at present. No nausea, vomiting, or diarrhea. States she has not been eating and drinking as well as she should.     Relevant past medical, surgical, family, and social history reviewed and updated as indicated.  Allergies and medications reviewed and updated.   Past Medical History:  Diagnosis Date   Allergic rhinitis    Anxiety    Arthritis    Asthma    CAP (community acquired pneumonia) 11/26/2017   COPD (chronic obstructive pulmonary disease) (HCC)    Depression    Dyspnea    with exertion    Elevated troponin 04/05/2016   Essential hypertension, benign    GERD (gastroesophageal reflux disease)    Headache    occiptal neuralgia - left    Heart murmur    History of blood transfusion    "related to miscarriage"   Left bundle branch block    in & out of A fib   Memory loss    Mixed hyperlipidemia    Occipital neuralgia of left side    Osteopenia    Secondary cardiomyopathy (HCC)    LVEF 40-45%, likely nonischemic   Urinary urgency     Past Surgical History:  Procedure Laterality Date   APPENDECTOMY     BACK SURGERY     CARDIAC CATHETERIZATION  2010   Normal coronaries   CATARACT EXTRACTION W/PHACO  04/16/2011   Procedure: CATARACT EXTRACTION PHACO AND INTRAOCULAR LENS PLACEMENT (IOC);  Surgeon: Susa Simmonds;  Location: [facility];  Service: Ophthalmology;  Laterality: Left;  CDE=11.35   CATARACT EXTRACTION W/PHACO  01/28/2012  Procedure: CATARACT EXTRACTION PHACO AND INTRAOCULAR LENS PLACEMENT (IOC);  Surgeon: Williams Che, MD;  Location: [facility];  Service: Ophthalmology;  Laterality: Right;  CDI:8.15   CYSTO WITH HYDRODISTENSION  05/29/2012   Procedure: CYSTOSCOPY/HYDRODISTENSION;  Surgeon: Reece Packer, MD;  Location: [facility];  Service: Urology;  Laterality: N/A;  INSTILLATION OF MARCAINE AND PYRIDIUM    DILATION AND CURETTAGE OF UTERUS  <hysterectomy"   EYE SURGERY     HAMMER TOE  SURGERY Bilateral 11/22/11   [place], [place]   JOINT REPLACEMENT     LAPAROSCOPIC CHOLECYSTECTOMY  1991   LUMBAR LAMINECTOMY  2011   LUMBAR LAMINECTOMY/DECOMPRESSION MICRODISCECTOMY Left 06/06/2012   Procedure: LUMBAR LAMINECTOMY/DECOMPRESSION MICRODISCECTOMY 1 LEVEL;  Surgeon: Erline Levine, MD;  Location: [place] [facility];  Service: Neurosurgery;  Laterality: Left;  Left Lumbar two-three Laminectomy for resection of synovial cyst   SHOULDER ARTHROSCOPY Right 02/12/2018   SHOULDER ARTHROSCOPY, BICEPS DEBRIDEMENT, REMOVAL OF LOOSE BODIES   SHOULDER ARTHROSCOPY Right 02/12/2018   Procedure: RIGHT SHOULDER ARTHROSCOPY, BICEPS DEBRIDEMENT, REMOVAL OF LOOSE BODIES;  Surgeon: Marybelle Killings, MD;  Location: [place];  Service: Orthopedics;  Laterality: Right;   SHOULDER OPEN ROTATOR CUFF REPAIR Right 2011   TONSILLECTOMY     TOTAL HIP ARTHROPLASTY Right 03/13/2013   Procedure: TOTAL HIP ARTHROPLASTY;  Surgeon: Kerin Salen, MD;  Location: [place];  Service: Orthopedics;  Laterality: Right;   TOTAL SHOULDER ARTHROPLASTY Right 07/02/2018   Procedure: RIGHT TOTAL SHOULDER ARTHROPLASTY;  Surgeon: Marybelle Killings, MD;  Location: [place];  Service: Orthopedics;  Laterality: Right;   VAGINAL HYSTERECTOMY      Social History   Socioeconomic History   Marital status: Widowed    Spouse name: Not on file   Number of children: 5   Years of education: 8th grade   Highest education level: Not on file  Occupational History   Occupation: Worked in Holiday representative for 30 years    Employer: RETIRED    Comment: Retired   Occupation: Part time care giver  Social Designer, fashion/clothing strain: Somewhat hard   Food insecurity    Worry: Never true    Inability: Never true   Transportation needs    Medical: No    Non-medical: No  Tobacco Use   Smoking status: Former Smoker    Years: 60.00    Types: Cigarettes    Quit date: 09/12/2018    Years since quitting: 0.3   Smokeless tobacco: Never  Used  Substance and Sexual Activity   Alcohol use: Not Currently    Comment: 11/27/2017 "drank some in my 20s"   Drug use: No   Sexual activity: Not Currently  Lifestyle   Physical activity    Days per week: 0 days    Minutes per session: 0 min   Stress: Only a little  Relationships   Social connections    Talks on phone: More than three times a week    Gets together: More than three times a week    Attends religious service: More than 4 times per year    Active member of club or organization: Yes    Attends meetings of clubs or organizations: More than 4 times per year    Relationship status: Widowed   Intimate partner violence    Fear of current or ex partner: No    Emotionally abused: No    Physically abused: No    Forced sexual activity: No  Other Topics  Concern   Not on file  Social History Narrative   Ms Zenda AlpersWebster is divorced. Her adult son lives with her and he is disabled due to bilateral AKA due to diabetes.  She helps to provide care to him. She has three adult daughters that live in the [place] area. She talks with them daily and they come to visit once or twice a year. She has 7 grandchildren also.     Outpatient Encounter Medications as of 02/02/2019  Medication Sig   albuterol (PROVENTIL) (2.5 MG/3ML) 0.083% nebulizer solution Take 3 mLs (2.5 mg total) by nebulization See admin instructions. Inhale 2.5 mg as via nebulizer twice daily, may also use every 6 hours as needed for shortness of breath/wheezing   albuterol (VENTOLIN HFA) 108 (90 Base) MCG/ACT inhaler Inhale 1-2 puffs into the lungs every 6 (six) hours as needed for wheezing or shortness of breath.   aspirin EC 81 MG tablet Take 81 mg by mouth every evening.    Biotin 1000 MCG tablet Take 1,000 mcg by mouth every morning.    buPROPion (WELLBUTRIN SR) 150 MG 12 hr tablet TAKE (1) TABLET TWICE A DAY. (Patient taking differently: Take 150 mg by mouth daily. )   carvedilol (COREG) 6.25 MG tablet TAKE  (1)   TABLET TWICE A DAY WITH MEALS (BREAKFAST AND SUPPER) (Patient taking differently: Take 6.25 mg by mouth 2 (two) times daily with a meal. )   fluticasone furoate-vilanterol (BREO ELLIPTA) 100-25 MCG/INH AEPB Inhale 1 puff into the lungs daily.   furosemide (LASIX) 20 MG tablet TAKE 1 TABLET ONCE DAILY (Patient taking differently: Take 20 mg by mouth daily. )   gabapentin (NEURONTIN) 100 MG capsule Take 1 capsule (100 mg total) by mouth 3 (three) times daily.   loratadine (CLARITIN) 10 MG tablet Take 1 tablet (10 mg total) by mouth daily.   losartan (COZAAR) 50 MG tablet TAKE (1/2) TABLET DAILY.   memantine (NAMENDA) 10 MG tablet TAKE  (1)  TABLET TWICE A DAY.   Multiple Vitamin (MULTIVITAMIN WITH MINERALS) TABS tablet Take 1 tablet by mouth daily. Centrum Silver   omeprazole (PRILOSEC) 40 MG capsule Take 1 capsule (40 mg total) by mouth daily.   pravastatin (PRAVACHOL) 80 MG tablet TAKE 1 TABLET DAILY (Patient taking differently: Take 80 mg by mouth at bedtime. )   raloxifene (EVISTA) 60 MG tablet TAKE 1 TABLET DAILY   Spacer/Aero-Holding Chambers (AEROCHAMBER MV) inhaler Use as instructed   trimethoprim-polymyxin b (POLYTRIM) ophthalmic solution Place 2 drops into both eyes every 4 (four) hours.   vitamin B-12 (CYANOCOBALAMIN) 1000 MCG tablet Take 1,000 mcg by mouth daily.   No facility-administered encounter medications on file as of 02/02/2019.     Allergies  Allergen Reactions   Lisinopril-Hydrochlorothiazide Swelling, Other (See Comments) and Cough    Lip swelling and cough. Stopped by ENT. Patient is on Losartan without any issues    Codeine Itching and Swelling    eye irritation (prescribed percocet)   Penicillins Itching and Swelling    PATIENT HAS HAD A PCN REACTION WITH IMMEDIATE RASH, FACIAL/TONGUE/THROAT SWELLING, SOB, OR LIGHTHEADEDNESS WITH HYPOTENSION:  yes Has patient had a PCN reaction causing severe rash involving mucus membranes or skin necrosis: No Has  patient had a PCN reaction that required hospitalization No Has patient had a PCN reaction occurring within the last 10 years: No eye irritation   Aricept [Donepezil Hcl] Nausea And Vomiting    Review of Systems  Constitutional: Positive for activity change, appetite  change and fatigue. Negative for chills, diaphoresis, fever and unexpected weight change.  HENT: Negative.   Eyes: Negative.  Negative for photophobia and visual disturbance.  Respiratory: Positive for cough, shortness of breath and wheezing. Negative for chest tightness.   Cardiovascular: Positive for chest pain. Negative for palpitations and leg swelling.  Gastrointestinal: Negative for abdominal pain, blood in stool, constipation, diarrhea, nausea and vomiting.  Endocrine: Negative.   Genitourinary: Negative for decreased urine volume, difficulty urinating, dysuria, frequency and urgency.  Musculoskeletal: Negative for arthralgias and myalgias.  Skin: Negative.  Negative for color change.  Allergic/Immunologic: Negative.   Neurological: Positive for dizziness and weakness. Negative for tremors, seizures, syncope, facial asymmetry, speech difficulty, light-headedness, numbness and headaches.  Hematological: Negative.   Psychiatric/Behavioral: Negative for confusion, hallucinations, sleep disturbance and suicidal ideas.  All other systems reviewed and are negative.        Observations/Objective: No vital signs or physical exam, this was a telephone or virtual health encounter.  Pt alert and oriented, answers all questions appropriately, and able to speak in full sentences.    Assessment and Plan: Alyson was seen today for fatigue, shortness of breath, dizziness and chest pain.  Diagnoses and all orders for this visit:  Shortness of breath General weakness Dizziness Chest pain in adult Pt unable to come into clinic today due to reported symptoms. Pt has reported shortness of breath, general weakness, fatigue,  dizziness, and intermittent chest pain. It was recommended the pt go to the ED for evaluation and treatment. Pt agrees and will have someone take her to the hospital.       Follow Up Instructions: Return if symptoms worsen or fail to improve.    I discussed the assessment and treatment plan with the patient. The patient was provided an opportunity to ask questions and all were answered. The patient agreed with the plan and demonstrated an understanding of the instructions.   The patient was advised to call back or seek an in-person evaluation if the symptoms worsen or if the condition fails to improve as anticipated.  The above assessment and management plan was discussed with the patient. The patient verbalized understanding of and has agreed to the management plan. Patient is aware to call the clinic if they develop any new symptoms or if symptoms persist or worsen. Patient is aware when to return to the clinic for a follow-up visit. Patient educated on when it is appropriate to go to the emergency department.    I provided 15 minutes of non-face-to-face time during this encounter. The call started at 1120. The call ended at 1135. The other time was used for coordination of care.    Kari Baars, FNP-C [facility] [address] [place], [place] [address] [phone] 02/02/19

## 2019-02-02 NOTE — Patient Instructions (Signed)
To ED for evaluation and treatment.

## 2019-02-03 ENCOUNTER — Telehealth: Payer: Self-pay | Admitting: Family Medicine

## 2019-02-03 NOTE — Telephone Encounter (Signed)
lmtcb

## 2019-02-04 ENCOUNTER — Other Ambulatory Visit: Payer: Self-pay | Admitting: Family Medicine

## 2019-02-04 DIAGNOSIS — M5481 Occipital neuralgia: Secondary | ICD-10-CM

## 2019-02-05 NOTE — Telephone Encounter (Signed)
lmtcb- no call back. This encounter will be closed.  

## 2019-02-19 ENCOUNTER — Other Ambulatory Visit: Payer: Self-pay | Admitting: Family Medicine

## 2019-02-19 ENCOUNTER — Ambulatory Visit: Payer: Self-pay | Admitting: Licensed Clinical Social Worker

## 2019-02-19 DIAGNOSIS — R0602 Shortness of breath: Secondary | ICD-10-CM

## 2019-02-19 DIAGNOSIS — F0390 Unspecified dementia without behavioral disturbance: Secondary | ICD-10-CM

## 2019-02-19 DIAGNOSIS — J449 Chronic obstructive pulmonary disease, unspecified: Secondary | ICD-10-CM

## 2019-02-19 DIAGNOSIS — F411 Generalized anxiety disorder: Secondary | ICD-10-CM

## 2019-02-19 DIAGNOSIS — F03A0 Unspecified dementia, mild, without behavioral disturbance, psychotic disturbance, mood disturbance, and anxiety: Secondary | ICD-10-CM

## 2019-02-19 DIAGNOSIS — F339 Major depressive disorder, recurrent, unspecified: Secondary | ICD-10-CM

## 2019-02-19 DIAGNOSIS — I10 Essential (primary) hypertension: Secondary | ICD-10-CM

## 2019-02-19 NOTE — Patient Instructions (Addendum)
Licensed Clinical Water engineer Provided: No   I reached out to [name] by phone today.   Ms. Fodge was given information about Chronic Care Management services today including:  1. CCM service includes personalized support from designated clinical staff supervised by her physician, including individualized plan of care and coordination with other care providers 2. 24/7 contact phone numbers for assistance for urgent and routine care needs. 3. Service will only be billed when office clinical staff spend 20 minutes or more in a month to coordinate care. 4. Only one practitioner may furnish and bill the service in a calendar month. 5. The patient may stop CCM services at any time (effective at the end of the month) by phone call to the office staff. 6. The patient will be responsible for cost sharing (co-pay) of up to 20% of the service fee (after annual deductible is met). Patient did not agree to services and wishes to consider information provided before deciding about enrollment in care management services.    LCSW spoke with Marylan today about CCM program services. She said she had spoken to her daughter about this program. She said her daughter may call [facility] soon to talk more about CCM program services available  Follow Up Plan: LCSW to call client in next 3 weeks talk with client about CCM program services  The patient verbalized understanding of instructions provided today and declined a print copy of patient instruction materials.   [name].Katalin Colledge MSW, LCSW Licensed Clinical Social Worker [facility] Care Management [phone]

## 2019-02-19 NOTE — Chronic Care Management (AMB) (Signed)
  Care Management Note   AYDAN PHOENIX is a 84 y.o. year old female who is a primary care patient of [name], Connye Burkitt, FNP. The CM team was consulted for assistance with chronic disease management and care coordination.   I reached out to [name] by phone today.   Ms. Hadlock was given information about Chronic Care Management services today including:  1. CCM service includes personalized support from designated clinical staff supervised by her physician, including individualized plan of care and coordination with other care providers 2. 24/7 contact phone numbers for assistance for urgent and routine care needs. 3. Service will only be billed when office clinical staff spend 20 minutes or more in a month to coordinate care. 4. Only one practitioner may furnish and bill the service in a calendar month. 5. The patient may stop CCM services at any time (effective at the end of the month) by phone call to the office staff. 6. The patient will be responsible for cost sharing (co-pay) of up to 20% of the service fee (after annual deductible is met). Patient did not agree to services and wishes to consider information provided before deciding about enrollment in care management services.    Review of patient status, including review of consultants reports, relevant laboratory and other test results, and collaboration with appropriate care team members and the patient's provider was performed as part of comprehensive patient evaluation and provision of chronic care management services.   Medications   New medications from outside sources are available for reconciliation   albuterol (PROVENTIL) (2.5 MG/3ML) 0.083% nebulizer solution    albuterol (VENTOLIN HFA) 108 (90 Base) MCG/ACT inhaler    aspirin EC 81 MG tablet    Biotin 1000 MCG tablet    buPROPion (WELLBUTRIN SR) 150 MG 12 hr tablet    carvedilol (COREG) 6.25 MG tablet    fluticasone furoate-vilanterol (BREO ELLIPTA) 100-25 MCG/INH  AEPB    furosemide (LASIX) 20 MG tablet    gabapentin (NEURONTIN) 100 MG capsule    loratadine (CLARITIN) 10 MG tablet    losartan (COZAAR) 50 MG tablet    memantine (NAMENDA) 10 MG tablet    Multiple Vitamin (MULTIVITAMIN WITH MINERALS) TABS tablet    omeprazole (PRILOSEC) 40 MG capsule    pravastatin (PRAVACHOL) 80 MG tablet    raloxifene (EVISTA) 60 MG tablet    Spacer/Aero-Holding Chambers (AEROCHAMBER MV) inhaler    trimethoprim-polymyxin b (POLYTRIM) ophthalmic solution    vitamin B-12 (CYANOCOBALAMIN) 1000 MCG tablet     LCSW spoke with [name] today about CCM program services. She said she had spoken to her daughter about this program. She said her daughter may call [facility] soon to talk more about CCM program services available  Follow Up Plan: LCSW to call client in next 3 weeks talk with client about CCM program services  [name].Ray Glacken MSW, LCSW Licensed Clinical Social Worker [facility] [phone]

## 2019-02-20 ENCOUNTER — Other Ambulatory Visit: Payer: Self-pay | Admitting: Family Medicine

## 2019-02-23 ENCOUNTER — Telehealth: Payer: Self-pay

## 2019-02-23 ENCOUNTER — Telehealth: Payer: Self-pay | Admitting: Family Medicine

## 2019-02-23 NOTE — Telephone Encounter (Signed)
Patient's daughter, Evon Slack, wants for a referral for a dexa scan for her mom.  She is aware we are not doing them in our office at this time and she does not want to wait to have this done.  Please call her with the appointment information so she can arrange transportation for her mother.

## 2019-02-23 NOTE — Telephone Encounter (Signed)
Appointment scheduled with Evelina Dun on 02/24/2019.

## 2019-02-24 ENCOUNTER — Other Ambulatory Visit: Payer: Self-pay

## 2019-02-24 ENCOUNTER — Ambulatory Visit: Payer: Medicare Other | Admitting: Family

## 2019-02-25 ENCOUNTER — Other Ambulatory Visit: Payer: Self-pay | Admitting: Family Medicine

## 2019-02-25 ENCOUNTER — Ambulatory Visit (INDEPENDENT_AMBULATORY_CARE_PROVIDER_SITE_OTHER): Payer: Medicare Other | Admitting: Family Medicine

## 2019-02-25 VITALS — BP 130/70 | HR 52 | Temp 97.8°F | Ht 64.0 in | Wt 163.0 lb

## 2019-02-25 DIAGNOSIS — R600 Localized edema: Secondary | ICD-10-CM | POA: Diagnosis not present

## 2019-02-25 DIAGNOSIS — N1831 Chronic kidney disease, stage 3a: Secondary | ICD-10-CM | POA: Diagnosis not present

## 2019-02-25 DIAGNOSIS — M8589 Other specified disorders of bone density and structure, multiple sites: Secondary | ICD-10-CM

## 2019-02-25 NOTE — Progress Notes (Signed)
Subjective: CC: Ankle swelling PCP: Baruch Gouty, FNP AST:[name] is a 84 y.o. female presenting to clinic today for:  1. Ankle swelling Patient reports onset on Thursday.  She notes this occurred out of the blue and has remained fairly consistent since Thursday.  She has been taking some Aleve for low back pain.  She denies any change in diet and notes that she tries avoid salt at all cost.  No change in activity.  No recent travel.  No new medications.  She is on Lasix once daily with good urine output.  In fact she reports overactive bladder.   ROS: Per HPI  Allergies  Allergen Reactions  . Lisinopril-Hydrochlorothiazide Swelling, Other (See Comments) and Cough    Lip swelling and cough. Stopped by ENT. Patient is on Losartan without any issues   . Codeine Itching and Swelling    eye irritation (prescribed percocet)  . Penicillins Itching and Swelling    PATIENT HAS HAD A PCN REACTION WITH IMMEDIATE RASH, FACIAL/TONGUE/THROAT SWELLING, SOB, OR LIGHTHEADEDNESS WITH HYPOTENSION:  yes Has patient had a PCN reaction causing severe rash involving mucus membranes or skin necrosis: No Has patient had a PCN reaction that required hospitalization No Has patient had a PCN reaction occurring within the last 10 years: No eye irritation  . Aricept [Donepezil Hcl] Nausea And Vomiting   Past Medical History:  Diagnosis Date  . Allergic rhinitis   . Anxiety   . Arthritis   . Asthma   . CAP (community acquired pneumonia) 11/26/2017  . COPD (chronic obstructive pulmonary disease) ([place])   . Depression   . Dyspnea    with exertion   . Elevated troponin 04/05/2016  . Essential hypertension, benign   . GERD (gastroesophageal reflux disease)   . Headache    occiptal neuralgia - left   . Heart murmur   . History of blood transfusion    "related to miscarriage"  . Left bundle branch block    in & out of A fib  . Memory loss   . Mixed hyperlipidemia   . Occipital neuralgia of  left side   . Osteopenia   . 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 Urinary urgency     Current Outpatient Medications:  .  albuterol (PROVENTIL) (2.5 MG/3ML) 0.083% nebulizer solution, Take 3 mLs (2.5 mg total) by nebulization See admin instructions. Inhale 2.5 mg as via nebulizer twice daily, may also use every 6 hours as needed for shortness of breath/wheezing, Disp: 75 mL, Rfl: 0 .  albuterol (VENTOLIN HFA) 108 (90 Base) MCG/ACT inhaler, Inhale 1-2 puffs into the lungs every 6 (six) hours as needed for wheezing or shortness of breath., Disp: 18 g, Rfl: 0 .  aspirin EC 81 MG tablet, Take 81 mg by mouth every evening. , Disp: , Rfl:  .  Biotin 1000 MCG tablet, Take 1,000 mcg by mouth every morning. , Disp: , Rfl:  .  BREO ELLIPTA 100-25 MCG/INH AEPB, Inhale 1 puff into the lungs daily., Disp: 60 each, Rfl: 0 .  buPROPion (WELLBUTRIN SR) 150 MG 12 hr tablet, TAKE (1) TABLET TWICE A DAY. (Patient taking differently: Take 150 mg by mouth daily. ), Disp: 180 tablet, Rfl: 1 .  carvedilol (COREG) 6.25 MG tablet, TAKE  (1)  TABLET TWICE A DAY WITH MEALS (BREAKFAST AND SUPPER) (Patient taking differently: Take 6.25 mg by mouth 2 (two) times daily with a meal. ), Disp: 180 tablet, Rfl:  2 .  furosemide (LASIX) 20 MG tablet, TAKE 1 TABLET ONCE DAILY (Patient taking differently: Take 20 mg by mouth daily. ), Disp: 90 tablet, Rfl: 3 .  gabapentin (NEURONTIN) 100 MG capsule, TAKE (1) CAPSULE THREE TIMES DAILY., Disp: 90 capsule, Rfl: 0 .  loratadine (CLARITIN) 10 MG tablet, Take 1 tablet (10 mg total) by mouth daily., Disp: 30 tablet, Rfl: 5 .  losartan (COZAAR) 50 MG tablet, TAKE (1/2) TABLET DAILY., Disp: 45 tablet, Rfl: 0 .  memantine (NAMENDA) 10 MG tablet, TAKE  (1)  TABLET TWICE A DAY., Disp: 60 tablet, Rfl: 1 .  Multiple Vitamin (MULTIVITAMIN WITH MINERALS) TABS tablet, Take 1 tablet by mouth daily. Centrum Silver, Disp: , Rfl:  .  omeprazole (PRILOSEC) 40 MG capsule,  Take 1 capsule (40 mg total) by mouth daily., Disp: 30 capsule, Rfl: 11 .  pravastatin (PRAVACHOL) 80 MG tablet, TAKE 1 TABLET DAILY (Patient taking differently: Take 80 mg by mouth at bedtime. ), Disp: 90 tablet, Rfl: 3 .  raloxifene (EVISTA) 60 MG tablet, TAKE 1 TABLET DAILY, Disp: 90 tablet, Rfl: 0 .  Spacer/Aero-Holding Chambers (AEROCHAMBER MV) inhaler, Use as instructed, Disp: 1 each, Rfl: 0 .  trimethoprim-polymyxin b (POLYTRIM) ophthalmic solution, Place 2 drops into both eyes every 4 (four) hours., Disp: 10 mL, Rfl: 0 .  vitamin B-12 (CYANOCOBALAMIN) 1000 MCG tablet, Take 1,000 mcg by mouth daily., Disp: , Rfl:  Social History   Socioeconomic History  . Marital status: Widowed    Spouse name: Not on file  . Number of children: 5  . Years of education: 8th grade  . Highest education level: Not on file  Occupational History  . Occupation: Worked in Geneticist, molecular for 30 years    Employer: RETIRED    Comment: Retired  . Occupation: Part time care giver  Social Needs  . Financial resource strain: Somewhat hard  . Food insecurity    Worry: Never true    Inability: Never true  . Transportation needs    Medical: No    Non-medical: No  Tobacco Use  . Smoking status: Former Smoker    Years: 60.00    Types: Cigarettes    Quit date: 09/12/2018    Years since quitting: 0.4  . Smokeless tobacco: Never Used  Substance and Sexual Activity  . Alcohol use: Not Currently    Comment: 11/27/2017 "drank some in my 20s"  . Drug use: No  . Sexual activity: Not Currently  Lifestyle  . Physical activity    Days per week: 0 days    Minutes per session: 0 min  . Stress: Only a little  Relationships  . Social connections    Talks on phone: More than three times a week    Gets together: More than three times a week    Attends religious service: More than 4 times per year    Active member of club or organization: Yes    Attends meetings of clubs or organizations: More than 4  times per year    Relationship status: Widowed  . Intimate partner violence    Fear of current or ex partner: No    Emotionally abused: No    Physically abused: No    Forced sexual activity: No  Other Topics Concern  . Not on file  Social History Narrative   Ms Budd is divorced. Her adult son lives with her and he is disabled due to bilateral AKA due to diabetes.  She helps to  provide care to him. She has three adult daughters that live in the [place] area. She talks with them daily and they come to visit once or twice a year. She has 7 grandchildren also.    Family History  Problem Relation Age of Onset  . Diabetes Father   . Diabetes Brother   . Cancer Brother 14       colon  . Diabetes Brother   . Diabetes Brother   . Hypertension Sister   . Diabetes Sister   . Diabetes Sister   . Diabetes Other   . Allergies Other   . Diabetes Son   . Colon cancer Neg Hx   . Anesthesia problems Neg Hx   . Hypotension Neg Hx   . Malignant hyperthermia Neg Hx   . Pseudochol deficiency Neg Hx     Objective: Office vital signs reviewed. BP 130/70   Pulse (!) 52   Temp 97.8 F (36.6 C) (Temporal)   Ht 5\' 4"  (1.626 m)   Wt 163 lb (73.9 kg)   BMI 27.98 kg/m   Physical Examination:  General: Awake, alert, well nourished, No acute distress HEENT: Normal; no JVD Cardio: regular rate and rhythm Pulm: clear to auscultation bilaterally, intermittent left-sided wheezes.no rhonchi or rales; normal work of breathing on room air Extremities: warm, well perfused, 1+ pitting ankle edema. no cyanosis or clubbing; +1 pulses bilaterally  Assessment/ Plan: 84 y.o. female   1. Bilateral lower extremity edema Possibly related to NSAID use in the setting of CKD 3.  We discussed avoiding NSAIDs totally.  Okay to use Tylenol if needed for aches and pains.  I have recommended compression hose, continued current dose of Lasix.  Nothing on exam to suggest fluid overload otherwise.  I reviewed her last  echocardiogram which showed mild diastolic dysfunction but normal ejection fraction.  Home care instructions reviewed with patient reasons for return discussed.  She was good understanding of follow-up as needed  2. Stage 3a chronic kidney disease As above   No orders of the defined types were placed in this encounter.  No orders of the defined types were placed in this encounter.    94, DO [facility] [phone]

## 2019-02-25 NOTE — Telephone Encounter (Signed)
Will place order to have completed at [facility]

## 2019-02-25 NOTE — Telephone Encounter (Signed)
Orders entered

## 2019-02-25 NOTE — Telephone Encounter (Signed)
LM for patient regarding order has been placed and she can have done at [facility].

## 2019-02-25 NOTE — Patient Instructions (Addendum)
STOP taking aleve. NSAID medications (aleve, motrin, advil, naproxen, ibuprofen) can all make swelling and kidney function worse.  You have impaired kidney function at baseline so you should not take that class of medication.  OKAY to take Tylenol every 8 hours if needed for pain.  Use the compression hose as we discussed.   Edema  Edema is when you have too much fluid in your body or under your skin. Edema may make your legs, feet, and ankles swell up. Swelling is also common in looser tissues, like around your eyes. This is a common condition. It gets more common as you get older. There are many possible causes of edema. Eating too much salt (sodium) and being on your feet or sitting for a long time can cause edema in your legs, feet, and ankles. Hot weather may make edema worse. Edema is usually painless. Your skin may look swollen or shiny. Follow these instructions at home:  Keep the swollen body part raised (elevated) above the level of your heart when you are sitting or lying down.  Do not sit still or stand for a long time.  Do not wear tight clothes. Do not wear garters on your upper legs.  Exercise your legs. This can help the swelling go down.  Wear elastic bandages or support stockings as told by your doctor.  Eat a low-salt (low-sodium) diet to reduce fluid as told by your doctor.  Depending on the cause of your swelling, you may need to limit how much fluid you drink (fluid restriction).  Take over-the-counter and prescription medicines only as told by your doctor. Contact a doctor if:  Treatment is not working.  You have heart, liver, or kidney disease and have symptoms of edema.  You have sudden and unexplained weight gain. Get help right away if:  You have shortness of breath or chest pain.  You cannot breathe when you lie down.  You have pain, redness, or warmth in the swollen areas.  You have heart, liver, or kidney disease and get edema all of a  sudden.  You have a fever and your symptoms get worse all of a sudden. Summary  Edema is when you have too much fluid in your body or under your skin.  Edema may make your legs, feet, and ankles swell up. Swelling is also common in looser tissues, like around your eyes.  Raise (elevate) the swollen body part above the level of your heart when you are sitting or lying down.  Follow your doctor's instructions about diet and how much fluid you can drink (fluid restriction). This information is not intended to replace advice given to you by your health care provider. Make sure you discuss any questions you have with your health care provider. Document Released: 10/03/2007 Document Revised: 04/19/2017 Document Reviewed: 05/04/2016 Elsevier Patient Education  2020 [employer].

## 2019-02-26 ENCOUNTER — Other Ambulatory Visit: Payer: Self-pay

## 2019-02-26 ENCOUNTER — Ambulatory Visit (INDEPENDENT_AMBULATORY_CARE_PROVIDER_SITE_OTHER): Payer: Medicare Other | Admitting: *Deleted

## 2019-02-26 VITALS — BP 130/70 | Ht 64.0 in | Wt 163.0 lb

## 2019-02-26 DIAGNOSIS — Z0000 Encounter for general adult medical examination without abnormal findings: Secondary | ICD-10-CM | POA: Diagnosis not present

## 2019-02-26 NOTE — Progress Notes (Addendum)
MEDICARE ANNUAL WELLNESS VISIT  02/26/2019  Telephone Visit Disclaimer This Medicare AWV was conducted by telephone due to national recommendations for restrictions regarding the COVID-19 Pandemic (e.g. social distancing).  I verified, using two identifiers, that I am speaking with [name] or their authorized healthcare agent. I discussed the limitations, risks, security, and privacy concerns of performing an evaluation and management service by telephone and the potential availability of an in-person appointment in the future. The patient expressed understanding and agreed to proceed.   Subjective:  [name] is a 84 y.o. female patient of [name], [name], FNP who had a Medicare Annual Wellness Visit today via telephone. [name] is Retired and lives with their son. she has 4 children living, 2 passed away. she reports that she is socially active and does interact with friends/family regularly. she is not physically active and enjoys reading.  Patient Care Team: Sonny Masters, FNP as PCP - General (Family Medicine) Rollene Rotunda, MD as PCP - Cardiology (Cardiology) Randa Spike Kelton Pillar, LCSW as Social Worker (Licensed Clinical Social Worker)  Advanced Directives 02/26/2019 12/13/2018 12/13/2018 10/04/2018 07/31/2018 07/02/2018 06/26/2018  Does Patient Have a Medical Advance Directive? No No No No No No No  Would patient like information on creating a medical advance directive? No - Patient declined No - Patient declined No - Patient declined No - Patient declined - Yes (MAU/Ambulatory/Procedural Areas - Information given) Yes (MAU/Ambulatory/Procedural Areas - Information given)  Pre-existing out of facility DNR order (yellow form or pink MOST form) - - - - - - -    Hospital Utilization Over the Past 12 Months: # of hospitalizations or ER visits: 3  # of surgeries: 0  Review of Systems    Patient reports that her overall health is worse compared to last year.  General ROS:  negative  Patient Reported Readings (BP, Pulse, CBG, Weight, etc) BP 130/70 Comment: home reading  Ht 5\' 4"  (1.626 m)   Wt 163 lb (73.9 kg)   BMI 27.98 kg/m    Pain Assessment       Current Medications & Allergies (verified) Allergies as of 02/26/2019      Reactions   Lisinopril-hydrochlorothiazide Swelling, Other (See Comments), Cough   Lip swelling and cough. Stopped by ENT. Patient is on Losartan without any issues    Codeine Itching, Swelling   eye irritation (prescribed percocet)   Penicillins Itching, Swelling   PATIENT HAS HAD A PCN REACTION WITH IMMEDIATE RASH, FACIAL/TONGUE/THROAT SWELLING, SOB, OR LIGHTHEADEDNESS WITH HYPOTENSION:  yes Has patient had a PCN reaction causing severe rash involving mucus membranes or skin necrosis: No Has patient had a PCN reaction that required hospitalization No Has patient had a PCN reaction occurring within the last 10 years: No eye irritation   Aricept [donepezil Hcl] Nausea And Vomiting      Medication List       Accurate as of February 26, 2019  9:48 AM. If you have any questions, ask your nurse or doctor.        STOP taking these medications   trimethoprim-polymyxin b ophthalmic solution Commonly known as: Polytrim     TAKE these medications   AeroChamber MV inhaler Use as instructed   albuterol (2.5 MG/3ML) 0.083% nebulizer solution Commonly known as: PROVENTIL Take 3 mLs (2.5 mg total) by nebulization See admin instructions. Inhale 2.5 mg as via nebulizer twice daily, may also use every 6 hours as needed for shortness of breath/wheezing   albuterol  108 (90 Base) MCG/ACT inhaler Commonly known as: VENTOLIN HFA Inhale 1-2 puffs into the lungs every 6 (six) hours as needed for wheezing or shortness of breath.   aspirin EC 81 MG tablet Take 81 mg by mouth every evening.   Biotin 1000 MCG tablet Take 1,000 mcg by mouth every morning.   Breo Ellipta 100-25 MCG/INH Aepb Generic drug: fluticasone  furoate-vilanterol Inhale 1 puff into the lungs daily.   buPROPion 150 MG 12 hr tablet Commonly known as: WELLBUTRIN SR TAKE (1) TABLET TWICE A DAY. What changed:   how much to take  how to take this  when to take this  additional instructions   carvedilol 6.25 MG tablet Commonly known as: COREG TAKE  (1)  TABLET TWICE A DAY WITH MEALS (BREAKFAST AND SUPPER) What changed:   how much to take  how to take this  when to take this  additional instructions   furosemide 20 MG tablet Commonly known as: LASIX TAKE 1 TABLET ONCE DAILY   gabapentin 100 MG capsule Commonly known as: NEURONTIN TAKE (1) CAPSULE THREE TIMES DAILY.   loratadine 10 MG tablet Commonly known as: CLARITIN Take 1 tablet (10 mg total) by mouth daily.   losartan 50 MG tablet Commonly known as: COZAAR TAKE (1/2) TABLET DAILY.   memantine 10 MG tablet Commonly known as: NAMENDA TAKE  (1)  TABLET TWICE A DAY.   multivitamin with minerals Tabs tablet Take 1 tablet by mouth daily. Centrum Silver   omeprazole 40 MG capsule Commonly known as: PRILOSEC Take 1 capsule (40 mg total) by mouth daily.   pravastatin 80 MG tablet Commonly known as: PRAVACHOL TAKE 1 TABLET DAILY What changed: when to take this   raloxifene 60 MG tablet Commonly known as: EVISTA TAKE 1 TABLET DAILY   vitamin B-12 1000 MCG tablet Commonly known as: CYANOCOBALAMIN Take 1,000 mcg by mouth daily.       History (reviewed): Past Medical History:  Diagnosis Date  . Allergic rhinitis   . Anxiety   . Arthritis   . Asthma   . CAP (community acquired pneumonia) 11/26/2017  . COPD (chronic obstructive pulmonary disease) ([facility])   . Depression   . Dyspnea    with exertion   . Elevated troponin 04/05/2016  . Essential hypertension, benign   . GERD (gastroesophageal reflux disease)   . Headache    occiptal neuralgia - left   . Heart murmur   . History of blood transfusion    "related to miscarriage"  . Left bundle  branch block    in & out of A fib  . Memory loss   . Mixed hyperlipidemia   . Occipital neuralgia of left side   . Osteopenia   . 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 Urinary urgency    Past Surgical History:  Procedure Laterality Date  . APPENDECTOMY    . BACK SURGERY    . CARDIAC CATHETERIZATION  2010   Normal coronaries  . CATARACT EXTRACTION W/PHACO  04/16/2011   Procedure: CATARACT EXTRACTION PHACO AND INTRAOCULAR LENS PLACEMENT (IOC);  Surgeon: Williams Che;  Location: [facility];  Service: Ophthalmology;  Laterality: Left;  CDE=11.35  . CATARACT EXTRACTION W/PHACO  01/28/2012   Procedure: CATARACT EXTRACTION PHACO AND INTRAOCULAR LENS PLACEMENT (IOC);  Surgeon: Williams Che, MD;  Location: [facility];  Service: Ophthalmology;  Laterality: Right;  CDI:8.15  . CYSTO WITH HYDRODISTENSION  05/29/2012   Procedure: CYSTOSCOPY/HYDRODISTENSION;  Surgeon:  Martina SinnerScott A MacDiarmid, MD;  [facility];  Service: Urology;  Laterality: N/A;  INSTILLATION OF MARCAINE AND PYRIDIUM   . DILATION AND CURETTAGE OF UTERUS  <hysterectomy"  . EYE SURGERY    . HAMMER TOE SURGERY Bilateral 11/22/11   [facility], [facility]  . JOINT REPLACEMENT    . LAPAROSCOPIC CHOLECYSTECTOMY  1991  . LUMBAR LAMINECTOMY  2011  . LUMBAR LAMINECTOMY/DECOMPRESSION MICRODISCECTOMY Left 06/06/2012   Procedure: LUMBAR LAMINECTOMY/DECOMPRESSION MICRODISCECTOMY 1 LEVEL;  Surgeon: Maeola HarmanJoseph Stern, MD;  Location: [facility];  Service: Neurosurgery;  Laterality: Left;  Left Lumbar two-three Laminectomy for resection of synovial cyst  . SHOULDER ARTHROSCOPY Right 02/12/2018   SHOULDER ARTHROSCOPY, BICEPS DEBRIDEMENT, REMOVAL OF LOOSE BODIES  . SHOULDER ARTHROSCOPY Right 02/12/2018   Procedure: RIGHT SHOULDER ARTHROSCOPY, BICEPS DEBRIDEMENT, REMOVAL OF LOOSE BODIES;  Surgeon: Eldred MangesYates, Mark C, MD;  Location: [facility];  Service: Orthopedics;  Laterality: Right;  . SHOULDER OPEN ROTATOR CUFF REPAIR Right  2011  . TONSILLECTOMY    . TOTAL HIP ARTHROPLASTY Right 03/13/2013   Procedure: TOTAL HIP ARTHROPLASTY;  Surgeon: Nestor LewandowskyFrank J Rowan, MD;  Location: [facility];  Service: Orthopedics;  Laterality: Right;  . TOTAL SHOULDER ARTHROPLASTY Right 07/02/2018   Procedure: RIGHT TOTAL SHOULDER ARTHROPLASTY;  Surgeon: Eldred MangesYates, Mark C, MD;  Location: [facility];  Service: Orthopedics;  Laterality: Right;  Marland Kitchen. VAGINAL HYSTERECTOMY     Family History  Problem Relation Age of Onset  . Diabetes Father   . Diabetes Brother   . Cancer Brother [id number]       colon  . Diabetes Brother   . Diabetes Brother   . Hypertension Sister   . Diabetes Sister   . Diabetes Sister   . Diabetes Other   . Allergies Other   . Diabetes Son   . Colon cancer Neg Hx   . Anesthesia problems Neg Hx   . Hypotension Neg Hx   . Malignant hyperthermia Neg Hx   . Pseudochol deficiency Neg Hx    Social History   Socioeconomic History  . Marital status: Widowed    Spouse name: Not on file  . Number of children: 5  . Years of education: 8th grade  . Highest education level: Not on file  Occupational History  . 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 Occupation: Part time care giver  Social Needs  . Financial resource strain: Somewhat hard  . Food insecurity    Worry: Never true    Inability: Never true  . Transportation needs    Medical: No    Non-medical: No  Tobacco Use  . Smoking status: Former Smoker    Years: 60.00    Types: Cigarettes    Quit date: 09/12/2018    Years since quitting: 0.4  . Smokeless tobacco: Never Used  Substance and Sexual Activity  . Alcohol use: Not Currently    Comment: 11/27/2017 "drank some in my 20s"  . Drug use: No  . Sexual activity: Not Currently  Lifestyle  . Physical activity    Days per week: 0 days    Minutes per session: 0 min  . Stress: Only a little  Relationships  . Social connections    Talks on phone: More than three  times a week    Gets together: More than three times a week    Attends religious service: More than 4 times per year  Active member of club or organization: Yes    Attends meetings of clubs or organizations: More than 4 times per year    Relationship status: Widowed  Other Topics Concern  . Not on file  Social History Narrative   Ms Zenda AlpersWebster is divorced. Her adult son lives with her and he is disabled due to bilateral AKA due to diabetes.  She helps to provide care to him. She has three adult daughters that live in the [place] area. She talks with them daily and they come to visit once or twice a year. She has 7 grandchildren also.     Activities of Daily Living In your present state of health, do you have any difficulty performing the following activities: 02/26/2019 12/13/2018  Hearing? Y N  Comment left ear worse -  Vision? N N  Comment for reading only -  Difficulty concentrating or making decisions? Y N  Walking or climbing stairs? Y Y  Comment due to breathing -  Dressing or bathing? N N  Doing errands, shopping? Y Y  Comment - neighbor sometimes goes  Quarry managerreparing Food and eating ? N -  Using the Toilet? N -  In the past six months, have you accidently leaked urine? N -  Do you have problems with loss of bowel control? N -  Managing your Medications? Y -  Managing your Finances? N -  Housekeeping or managing your Housekeeping? N -  Some recent data might be hidden    Patient Education/ Literacy    Exercise Current Exercise Habits: The patient does not participate in regular exercise at present  Diet Patient reports consuming 2 meals a day and 1 snack(s) a day Patient reports that her primary diet is: Regular Patient reports that she does have regular access to food.   Depression Screen PHQ 2/9 Scores 02/26/2019 02/25/2019 01/23/2019 11/04/2018 10/14/2018 09/30/2018 08/04/2018  PHQ - 2 Score 0 0 0 0 0 0 0  PHQ- 9 Score - 0 - - - - 0     Fall Risk Fall Risk  02/26/2019  02/25/2019 01/23/2019 11/04/2018 10/14/2018  Falls in the past year? 0 0 0 0 0  Comment - - - - -  Number falls in past yr: - - - - -  Injury with Fall? - - - - -  Follow up - - - - -     Objective:  [name] seemed alert and oriented and she participated appropriately during our telephone visit.  Blood Pressure Weight BMI  BP Readings from Last 3 Encounters:  02/26/19 130/70  02/25/19 130/70  01/23/19 (!) 155/81   Wt Readings from Last 3 Encounters:  02/26/19 163 lb (73.9 kg)  02/25/19 163 lb (73.9 kg)  01/23/19 158 lb 9.6 oz (71.9 kg)   BMI Readings from Last 1 Encounters:  02/26/19 27.98 kg/m    *Unable to obtain current vital signs, weight, and BMI due to telephone visit type  Hearing/Vision  . [name] did not seem to have difficulty with hearing/understanding during the telephone conversation . Reports that she has not had a formal eye exam by an eye care professional within the past year . Reports that she has not had a formal hearing evaluation within the past year *Unable to fully assess hearing and vision during telephone visit type  Cognitive Function: 6CIT Screen 02/26/2019 02/24/2018  What Year? 4 points 0 points  What month? 3 points 3 points  What time? 0 points 0 points  Count back  from 20 2 points 0 points  Months in reverse 0 points 4 points  Repeat phrase 6 points 0 points  Total Score 15 7   (Normal:0-7, Significant for Dysfunction: >8)  Normal Cognitive Function Screening: No: she was somewhat confused and had several inaccurate answers.   Immunization & Health Maintenance Record Immunization History  Administered Date(s) Administered  . Fluad Quad(high Dose 65+) 01/23/2019  . Influenza Split 01/29/2011, 01/29/2012, 01/26/2013  . Influenza, High Dose Seasonal PF 02/15/2015, 01/23/2016, 02/08/2017, 01/18/2018  . Influenza,inj,Quad PF,6+ Mos 12/07/2013  . Pneumococcal Conjugate-13 07/07/2014  . Pneumococcal Polysaccharide-23 01/28/2010,  07/03/2018  . Zoster 10/25/2011    Health Maintenance  Topic Date Due  . TETANUS/TDAP  11/28/2016  . DEXA SCAN  02/15/2019  . INFLUENZA VACCINE  Completed  . PNA vac Low Risk Adult  Completed       Assessment  This is a routine wellness examination for [name].  Health Maintenance: Due or Overdue Health Maintenance Due  Topic Date Due  . TETANUS/TDAP  11/28/2016  . DEXA SCAN  02/15/2019    [name] does not need a referral for Community Assistance: Care Management:   no Social Work:    no Prescription Assistance:  no Nutrition/Diabetes Education:  no   Plan:  Personalized Goals Goals Addressed            This Visit's Progress   . Exercise 150 minutes per week (moderate activity)   Not on track    Walk for 30 minutes daily    . Prevent falls       Stay active       Personalized Health Maintenance & Screening Recommendations  Td vaccine  Lung Cancer Screening Recommended: no (Low Dose CT Chest recommended if Age 11-80 years, 30 pack-year currently smoking OR have quit w/in past 15 years) Hepatitis C Screening recommended: no HIV Screening recommended: no  Advanced Directives: Written information was not prepared per patient's request.  Referrals & Orders No orders of the defined types were placed in this encounter.   Follow-up Plan . Follow-up with Sonny Masters, FNP as planned    I have personally reviewed and noted the following in the patient's chart:   . Medical and social history . Use of alcohol, tobacco or illicit drugs  . Current medications and supplements . Functional ability and status . Nutritional status . Physical activity . Advanced directives . List of other physicians . Hospitalizations, surgeries, and ER visits in previous 12 months . Vitals . Screenings to include cognitive, depression, and falls . Referrals and appointments  In addition, I have reviewed and discussed with [name] certain preventive  protocols, quality metrics, and best practice recommendations. A written personalized care plan for preventive services as well as general preventive health recommendations is available and can be mailed to the patient at her request.      Kline Bulthuis, Almond Lint  02/26/2019   I have reviewed and agree with the above AWV documentation.   Mary-Margaret Daphine Deutscher, FNP

## 2019-02-26 NOTE — Patient Instructions (Signed)
  Ms. Hannen , Thank you for taking time to come for your Medicare Wellness Visit. I appreciate your ongoing commitment to your health goals. Please review the following plan we discussed and let me know if I can assist you in the future.   These are the goals we discussed: Goals    . Exercise 150 minutes per week (moderate activity)     Walk for 30 minutes daily    . Prevent falls     Stay active        This is a list of the screening recommended for you and due dates:  Health Maintenance  Topic Date Due  . Tetanus Vaccine  11/28/2016  . DEXA scan (bone density measurement)  02/15/2019  . Flu Shot  Completed  . Pneumonia vaccines  Completed

## 2019-03-06 ENCOUNTER — Other Ambulatory Visit: Payer: Self-pay | Admitting: Family Medicine

## 2019-03-06 DIAGNOSIS — J449 Chronic obstructive pulmonary disease, unspecified: Secondary | ICD-10-CM

## 2019-03-11 ENCOUNTER — Other Ambulatory Visit: Payer: Self-pay | Admitting: Family Medicine

## 2019-03-11 DIAGNOSIS — I469 Cardiac arrest, cause unspecified: Secondary | ICD-10-CM | POA: Insufficient documentation

## 2019-03-11 DIAGNOSIS — M5481 Occipital neuralgia: Secondary | ICD-10-CM

## 2019-03-12 ENCOUNTER — Encounter: Payer: Self-pay | Admitting: Cardiology

## 2019-03-12 ENCOUNTER — Ambulatory Visit: Payer: Self-pay | Admitting: Licensed Clinical Social Worker

## 2019-03-12 DIAGNOSIS — F339 Major depressive disorder, recurrent, unspecified: Secondary | ICD-10-CM

## 2019-03-12 DIAGNOSIS — R0602 Shortness of breath: Secondary | ICD-10-CM

## 2019-03-12 DIAGNOSIS — F0390 Unspecified dementia without behavioral disturbance: Secondary | ICD-10-CM

## 2019-03-12 DIAGNOSIS — I10 Essential (primary) hypertension: Secondary | ICD-10-CM

## 2019-03-12 DIAGNOSIS — F03A0 Unspecified dementia, mild, without behavioral disturbance, psychotic disturbance, mood disturbance, and anxiety: Secondary | ICD-10-CM

## 2019-03-12 DIAGNOSIS — J449 Chronic obstructive pulmonary disease, unspecified: Secondary | ICD-10-CM

## 2019-03-12 DIAGNOSIS — E785 Hyperlipidemia, unspecified: Secondary | ICD-10-CM | POA: Insufficient documentation

## 2019-03-12 DIAGNOSIS — F411 Generalized anxiety disorder: Secondary | ICD-10-CM

## 2019-03-12 MED ORDER — LOSARTAN POTASSIUM 25 MG PO TABS
25.00 | ORAL_TABLET | ORAL | Status: DC
Start: 2019-03-12 — End: 2019-03-12

## 2019-03-12 MED ORDER — ENOXAPARIN SODIUM 40 MG/0.4ML ~~LOC~~ SOLN
40.00 | SUBCUTANEOUS | Status: DC
Start: 2019-03-11 — End: 2019-03-12

## 2019-03-12 MED ORDER — CARVEDILOL 3.125 MG PO TABS
6.25 | ORAL_TABLET | ORAL | Status: DC
Start: 2019-03-11 — End: 2019-03-12

## 2019-03-12 MED ORDER — LIDOCAINE 4 % EX PTCH
1.00 | MEDICATED_PATCH | CUTANEOUS | Status: DC
Start: 2019-03-11 — End: 2019-03-12

## 2019-03-12 MED ORDER — ARFORMOTEROL TARTRATE 15 MCG/2ML IN NEBU
15.00 | INHALATION_SOLUTION | RESPIRATORY_TRACT | Status: DC
Start: 2019-03-11 — End: 2019-03-12

## 2019-03-12 MED ORDER — ASPIRIN EC 81 MG PO TBEC
81.00 | DELAYED_RELEASE_TABLET | ORAL | Status: DC
Start: 2019-03-12 — End: 2019-03-12

## 2019-03-12 MED ORDER — MEMANTINE HCL 10 MG PO TABS
10.00 | ORAL_TABLET | ORAL | Status: DC
Start: 2019-03-11 — End: 2019-03-12

## 2019-03-12 MED ORDER — RALOXIFENE HCL 60 MG PO TABS
60.00 | ORAL_TABLET | ORAL | Status: DC
Start: 2019-03-12 — End: 2019-03-12

## 2019-03-12 MED ORDER — ATORVASTATIN CALCIUM 40 MG PO TABS
40.00 | ORAL_TABLET | ORAL | Status: DC
Start: 2019-03-11 — End: 2019-03-12

## 2019-03-12 MED ORDER — IPRATROPIUM BROMIDE 0.02 % IN SOLN
0.50 | RESPIRATORY_TRACT | Status: DC
Start: 2019-03-11 — End: 2019-03-12

## 2019-03-12 MED ORDER — BUDESONIDE 0.5 MG/2ML IN SUSP
0.50 | RESPIRATORY_TRACT | Status: DC
Start: 2019-03-11 — End: 2019-03-12

## 2019-03-12 MED ORDER — ACETAMINOPHEN 500 MG PO TABS
1000.00 | ORAL_TABLET | ORAL | Status: DC
Start: 2019-03-11 — End: 2019-03-12

## 2019-03-12 MED ORDER — ALBUTEROL SULFATE (2.5 MG/3ML) 0.083% IN NEBU
2.50 | INHALATION_SOLUTION | RESPIRATORY_TRACT | Status: DC
Start: ? — End: 2019-03-12

## 2019-03-12 MED ORDER — PANTOPRAZOLE SODIUM 40 MG PO TBEC
40.00 | DELAYED_RELEASE_TABLET | ORAL | Status: DC
Start: 2019-03-12 — End: 2019-03-12

## 2019-03-12 MED ORDER — BUPROPION HCL ER (SR) 150 MG PO TB12
150.00 | ORAL_TABLET | ORAL | Status: DC
Start: 2019-03-11 — End: 2019-03-12

## 2019-03-12 NOTE — Patient Instructions (Addendum)
Licensed Clinical Water engineer Provided: No  I reached out to [name] by phone today.   Ms. Sibilia was given information about Chronic Care Management services today including:  1. CCM service includes personalized support from designated clinical staff supervised by her physician, including individualized plan of care and coordination with other care providers 2. 24/7 contact phone numbers for assistance for urgent and routine care needs. 3. Service will only be billed when office clinical staff spend 20 minutes or more in a month to coordinate care. 4. Only one practitioner may furnish and bill the service in a calendar month. 5. The patient may stop CCM services at any time (effective at the end of the month) by phone call to the office staff. 6. The patient will be responsible for cost sharing (co-pay) of up to 20% of the service fee (after annual deductible is met). Patient did not agree to services and wishes to consider information provided before deciding about enrollment in care management services.    LCSW spoke with [name] via phone . She said she is now at [facility] facility in [place], [place].  She said she is weak and chest is sore. She said she is using oxygen as needed. She said she thinks she will start getting physical therapy in the near future at facility. She has support from her daughters. They do live out of the local area.  She said she is eating well and sleeping well.   Follow Up Plan: LCSW to call [name] in next 3 weeks to assess the social work needs of Ryn at that time    The patient verbalized understanding of instructions provided today and declined a print copy of patient instruction materials.   [name].Dontrey Snellgrove MSW, LCSW Licensed Clinical Social Worker [facility] [phone]

## 2019-03-12 NOTE — Progress Notes (Signed)
Cardiology Office Note   Date:  03/13/2019   ID:  [name], DOB 25-Dec-1934, MRN [id number]  PCP:  Sonny Mastersakes, Linda M, FNP  Cardiologist:   Rollene RotundaJames Latonya Nelon, MD   Chief Complaint  Patient presents with  . Shortness of Breath      History of Present Illness: [name] is a 84 y.o. female who presents for follow up of diastolicCHF.  [facility].     I reviewed these records for this visit.  She was in the hospital after an apparent respiratory arrest.  She says it was while he was leaving work.  The records say it was at home.  I could not get a hold of her daughter to discuss it.  It sounds however like it was purely respiratory related to her chronic lung disease.  She is on O2 at home and has COPD.  She does have diastolic and systolic dysfunction.  I do note that high-sensitivity troponins were negative x2.  I did not suspect an acute cardiac event.  I did do an echocardiogram and her ejection fraction which had been 40 to 45% was 35 to 40%.  The uptitrated her beta-blocker.  She returns for follow-up.  She is at rehab.  She says she is doing a little bit of walking.  There seems to be some confusion and this was mentioned during the hospitalization 2.  However, she is denying any acute symptoms.  She does not have any chest pressure, neck or arm discomfort.  She is not having any palpitations, presyncope or syncope.  Has had no weight gain or edema   Past Medical History:  Diagnosis Date  . Allergic rhinitis   . Anxiety   . Arthritis   . Asthma   . CAP (community acquired pneumonia) 11/26/2017  . COPD (chronic obstructive pulmonary disease) (HCC)   . Depression   . Dyspnea    with exertion   . Elevated troponin 04/05/2016  . Essential hypertension, benign   . GERD (gastroesophageal reflux disease)   . Headache    occiptal neuralgia - left   . History of blood transfusion    "related to miscarriage"  . Left bundle branch block     in & out of A fib  . Memory loss   . Mixed hyperlipidemia   . Occipital neuralgia of left side   . Osteopenia   . Secondary cardiomyopathy (HCC)    LVEF 40-45%, likely nonischemic    Past Surgical History:  Procedure Laterality Date  . APPENDECTOMY    . BACK SURGERY    . CARDIAC CATHETERIZATION  2010   Normal coronaries  . CATARACT EXTRACTION W/PHACO  04/16/2011   Procedure: CATARACT EXTRACTION PHACO AND INTRAOCULAR LENS PLACEMENT (IOC);  Surgeon: Susa Simmondsarroll F Haines;  Location: [facility];  Service: Ophthalmology;  Laterality: Left;  CDE=11.35  . CATARACT EXTRACTION W/PHACO  01/28/2012   Procedure: CATARACT EXTRACTION PHACO AND INTRAOCULAR LENS PLACEMENT (IOC);  Surgeon: Susa Simmondsarroll F Haines, MD;  Location: [facility];  Service: Ophthalmology;  Laterality: Right;  CDI:8.15  . CYSTO WITH HYDRODISTENSION  05/29/2012   Procedure: CYSTOSCOPY/HYDRODISTENSION;  Surgeon: Martina SinnerScott A MacDiarmid, MD;  [facility];  Service: Urology;  Laterality: N/A;  INSTILLATION OF MARCAINE AND PYRIDIUM   . DILATION AND CURETTAGE OF UTERUS  <hysterectomy"  . EYE SURGERY    . HAMMER TOE SURGERY Bilateral 11/22/11   [facility]  . JOINT REPLACEMENT    .  LAPAROSCOPIC CHOLECYSTECTOMY  1991  . LUMBAR LAMINECTOMY  2011  . LUMBAR LAMINECTOMY/DECOMPRESSION MICRODISCECTOMY Left 06/06/2012   Procedure: LUMBAR LAMINECTOMY/DECOMPRESSION MICRODISCECTOMY 1 LEVEL;  Surgeon: Erline Levine, MD;  Location: [place] [facility];  Service: Neurosurgery;  Laterality: Left;  Left Lumbar two-three Laminectomy for resection of synovial cyst  . SHOULDER ARTHROSCOPY Right 02/12/2018   SHOULDER ARTHROSCOPY, BICEPS DEBRIDEMENT, REMOVAL OF LOOSE BODIES  . SHOULDER ARTHROSCOPY Right 02/12/2018   Procedure: RIGHT SHOULDER ARTHROSCOPY, BICEPS DEBRIDEMENT, REMOVAL OF LOOSE BODIES;  Surgeon: Marybelle Killings, MD;  Location: [place];  Service: Orthopedics;  Laterality: Right;  . SHOULDER OPEN ROTATOR CUFF REPAIR Right 2011  . TONSILLECTOMY    .  TOTAL HIP ARTHROPLASTY Right 03/13/2013   Procedure: TOTAL HIP ARTHROPLASTY;  Surgeon: Kerin Salen, MD;  Location: [place];  Service: Orthopedics;  Laterality: Right;  . TOTAL SHOULDER ARTHROPLASTY Right 07/02/2018   Procedure: RIGHT TOTAL SHOULDER ARTHROPLASTY;  Surgeon: Marybelle Killings, MD;  Location: [place];  Service: Orthopedics;  Laterality: Right;  Marland Kitchen VAGINAL HYSTERECTOMY       Current Outpatient Medications  Medication Sig Dispense Refill  . albuterol (PROVENTIL) (2.5 MG/3ML) 0.083% nebulizer solution Take 3 mLs (2.5 mg total) by nebulization See admin instructions. Inhale 2.5 mg as via nebulizer twice daily, may also use every 6 hours as needed for shortness of breath/wheezing 75 mL 0  . albuterol (VENTOLIN HFA) 108 (90 Base) MCG/ACT inhaler Inhale 1-2 puffs into the lungs every 6 (six) hours as needed for wheezing or shortness of breath. 18 g 0  . aspirin EC 81 MG tablet Take 81 mg by mouth every evening.     Marland Kitchen atorvastatin (LIPITOR) 40 MG tablet Take 40 mg by mouth daily.    . Biotin 1000 MCG tablet Take 1,000 mcg by mouth every morning.     Marland Kitchen BREO ELLIPTA 100-25 MCG/INH AEPB Inhale 1 puff into the lungs daily. 60 each 0  . buPROPion (WELLBUTRIN SR) 150 MG 12 hr tablet TAKE (1) TABLET TWICE A DAY. (Patient taking differently: Take 150 mg by mouth daily. ) 180 tablet 1  . carvedilol (COREG) 6.25 MG tablet TAKE  (1)  TABLET TWICE A DAY WITH MEALS (BREAKFAST AND SUPPER) (Patient taking differently: Take 6.25 mg by mouth 2 (two) times daily with a meal. ) 180 tablet 2  . furosemide (LASIX) 20 MG tablet TAKE 1 TABLET ONCE DAILY (Patient taking differently: Take 20 mg by mouth daily. ) 90 tablet 3  . gabapentin (NEURONTIN) 100 MG capsule TAKE (1) CAPSULE THREE TIMES DAILY. 90 capsule 0  . loratadine (CLARITIN) 10 MG tablet Take 1 tablet (10 mg total) by mouth daily. 30 tablet 5  . losartan (COZAAR) 50 MG tablet Take 1 tablet (50 mg total) by mouth daily. 90 tablet 3  . memantine (NAMENDA) 10 MG  tablet TAKE  (1)  TABLET TWICE A DAY. 60 tablet 1  . Multiple Vitamin (MULTIVITAMIN WITH MINERALS) TABS tablet Take 1 tablet by mouth daily. Centrum Silver    . omeprazole (PRILOSEC) 40 MG capsule Take 1 capsule (40 mg total) by mouth daily. 30 capsule 11  . raloxifene (EVISTA) 60 MG tablet TAKE 1 TABLET DAILY 90 tablet 0  . Spacer/Aero-Holding Chambers (AEROCHAMBER MV) inhaler Use as instructed 1 each 0  . TRELEGY ELLIPTA 100-62.5-25 MCG/INH AEPB INHALE 1 DOSE AS DIRECTEDDAILY 60 each 0  . vitamin B-12 (CYANOCOBALAMIN) 1000 MCG tablet Take 1,000 mcg by mouth daily.     No current facility-administered medications  for this visit.     Allergies:   Lisinopril-hydrochlorothiazide, Codeine, Penicillins, and Aricept [donepezil hcl]     ROS:  Please see the history of present illness.   Otherwise, review of systems are positive for none.   All other systems are reviewed and negative.    PHYSICAL EXAM: VS:  BP 140/76   Pulse 84   Temp (!) 97 F (36.1 C)   Ht 5\' 4"  (1.626 m)   Wt 156 lb 9.6 oz (71 kg)   SpO2 96%   BMI 26.88 kg/m  , BMI Body mass index is 26.88 kg/m. GEN:  No distress, slightly frail appearing NECK:  No jugular venous distention at 90 degrees, waveform within normal limits, carotid upstroke brisk and symmetric, no bruits, no thyromegaly LYMPHATICS:  No cervical adenopathy LUNGS:  Clear to auscultation bilaterally BACK:  No CVA tenderness CHEST:  Unremarkable HEART:  S1 and S2 within normal limits, no S3, no S4, no clicks, no rubs, no murmurs ABD:  Positive bowel sounds normal in frequency in pitch, no bruits, no rebound, no guarding, unable to assess midline mass or bruit with the patient seated. EXT:  2 plus pulses throughout, moderate edema, no cyanosis no clubbing SKIN:  No rashes no nodules NEURO:  Cranial nerves II through XII grossly intact, motor grossly intact throughout PSYCH:  Cognitively intact, oriented to person place and time     GENERAL:  Well  appearing HEENT:  Pupils equal round and reactive, fundi not visualized, oral mucosa unremarkable NECK:  No jugular venous distention, waveform within normal limits, carotid upstroke brisk and symmetric, no bruits, no thyromegaly LYMPHATICS:  No cervical, inguinal adenopathy LUNGS:  Clear to auscultation bilaterally BACK:  No CVA tenderness CHEST:  Unremarkable HEART:  PMI not displaced or sustained,S1 and S2 within normal limits, no S3, no S4, no clicks, no rubs, no murmurs ABD:  Flat, positive bowel sounds normal in frequency in pitch, no bruits, no rebound, no guarding, no midline pulsatile mass, no hepatomegaly, no splenomegaly EXT:  2 plus pulses throughout, no edema, no cyanosis no clubbing SKIN:  No rashes no nodules NEURO:  Cranial nerves II through XII grossly intact, motor grossly intact throughout PSYCH:  Cognitively intact, oriented to person place and time    EKG:  EKG is ordered today. The ekg ordered today demonstrates sinus rhythm, premature atrial contractions, left bundle branch block   Recent Labs: 10/14/2018: BNP 168.7; Magnesium 2.1; TSH 0.832 01/23/2019: ALT 15; BUN 19; Creatinine, Ser 1.14; Hemoglobin 13.5; Platelets 359; Potassium 3.7; Sodium 146    Lipid Panel    Component Value Date/Time   CHOL 176 09/30/2018 0843   CHOL 149 10/27/2012 1028   TRIG 117 09/30/2018 0843   TRIG 61 01/26/2013 0927   TRIG 72 10/27/2012 1028   HDL 73 09/30/2018 0843   HDL 76 01/26/2013 0927   HDL 59 10/27/2012 1028   CHOLHDL 2.4 09/30/2018 0843   LDLCALC 80 09/30/2018 0843   LDLCALC 98 01/26/2013 0927   LDLCALC 76 10/27/2012 1028   LDLDIRECT 135 (H) 06/12/2017 1503      Wt Readings from Last 3 Encounters:  03/13/19 156 lb 9.6 oz (71 kg)  02/26/19 163 lb (73.9 kg)  02/25/19 163 lb (73.9 kg)      Other studies Reviewed: Additional studies/ records that were reviewed today include: [facility] records. Review of the above records demonstrates:  Please see elsewhere  in the note.     ASSESSMENT AND PLAN:  ACUTE ON CHRONIC SYSTOLIC AND DIASTOLIC HF:  She seems to be euvolemic.  It did not seem like volume was a significant issue during her recent hospitalization.  Ambulate up titrate her Cozaar to 50 mg daily.  She is to come back in about 2 weeks for further med titration.  About 3 months with repeat an echocardiogram.   UJW:[id number]:This is being managed in the context of treating his CHF  CAROTID DOPPLER: She had less than 50% stenosis in March.    We can follow this up in 1 year.   TOBACCO ABUSE:  It sounds like she probably stop smoking.    DYSLIPIDEMIA: Her LDL in June was 80.  I will repeat this in 1 year.  Continue current meds.   QT:  This was prolonged.  She should avoid QT prolonging drugs.  The QT does not seem exceptionally long for the degree of blood pressure(s).  Current medicines are reviewed at length with the patient today.  The patient does not have concerns regarding medicines.  The following changes have been made:  As above  Labs/ tests ordered today include: None No orders of the defined types were placed in this encounter.    Disposition:   FU with me APP in two weeks.     Signed, Rollene RotundaJames Deepti Gunawan, MD  03/13/2019 9:28 AM    [facility]

## 2019-03-12 NOTE — Chronic Care Management (AMB) (Signed)
  Care Management Note   [name] is a 84 y.o. year old female who is a primary care patient of [name], Connye Burkitt, FNP. The CM team was consulted for assistance with chronic disease management and care coordination.   I reached out to [name] by phone today.   Ms. [name] was given information about Chronic Care Management services today including:  1. CCM service includes personalized support from designated clinical staff supervised by her physician, including individualized plan of care and coordination with other care providers 2. 24/7 contact phone numbers for assistance for urgent and routine care needs. 3. Service will only be billed when office clinical staff spend 20 minutes or more in a month to coordinate care. 4. Only one practitioner may furnish and bill the service in a calendar month. 5. The patient may stop CCM services at any time (effective at the end of the month) by phone call to the office staff. 6. The patient will be responsible for cost sharing (co-pay) of up to 20% of the service fee (after annual deductible is met). Patient did not agree to services and wishes to consider information provided before deciding about enrollment in care management services.    Review of patient status, including review of consultants reports, relevant laboratory and other test results, and collaboration with appropriate care team members and the patient's provider was performed as part of comprehensive patient evaluation and provision of chronic care management services.   Medications   New medications from outside sources are available for reconciliation   albuterol (PROVENTIL) (2.5 MG/3ML) 0.083% nebulizer solution    albuterol (VENTOLIN HFA) 108 (90 Base) MCG/ACT inhaler    aspirin EC 81 MG tablet    Biotin 1000 MCG tablet    BREO ELLIPTA 100-25 MCG/INH AEPB    buPROPion (WELLBUTRIN SR) 150 MG 12 hr tablet    carvedilol (COREG) 6.25 MG tablet    furosemide (LASIX) 20 MG  tablet    gabapentin (NEURONTIN) 100 MG capsule    loratadine (CLARITIN) 10 MG tablet    losartan (COZAAR) 50 MG tablet    memantine (NAMENDA) 10 MG tablet    Multiple Vitamin (MULTIVITAMIN WITH MINERALS) TABS tablet    omeprazole (PRILOSEC) 40 MG capsule    pravastatin (PRAVACHOL) 80 MG tablet    raloxifene (EVISTA) 60 MG tablet    Spacer/Aero-Holding Chambers (AEROCHAMBER MV) inhaler    TRELEGY ELLIPTA 100-62.5-25 MCG/INH AEPB    vitamin B-12 (CYANOCOBALAMIN) 1000 MCG tablet      LCSW spoke with [name] via phone . She said she is now at [facility] facility in [place], [place].  She said she is weak and chest is sore. She said she is using oxygen as needed. She said she thinks she will start getting physical therapy in the near future at facility. She has support from her daughters. They do live out of the local area.  She said she is eating well and sleeping well.   Follow Up Plan: LCSW to call [name] in next 3 weeks to assess the social work needs of [name] at that time   [name].Briona Korpela MSW, LCSW Licensed Clinical Social Worker [facility] [phone]

## 2019-03-13 ENCOUNTER — Ambulatory Visit: Payer: Medicare Other | Admitting: Family Medicine

## 2019-03-13 ENCOUNTER — Other Ambulatory Visit: Payer: Self-pay

## 2019-03-13 ENCOUNTER — Ambulatory Visit: Payer: Medicare Other | Admitting: Cardiology

## 2019-03-13 ENCOUNTER — Encounter: Payer: Self-pay | Admitting: Cardiology

## 2019-03-13 VITALS — BP 140/76 | HR 84 | Temp 97.0°F | Ht 64.0 in | Wt 156.6 lb

## 2019-03-13 DIAGNOSIS — Z720 Tobacco use: Secondary | ICD-10-CM

## 2019-03-13 DIAGNOSIS — I5033 Acute on chronic diastolic (congestive) heart failure: Secondary | ICD-10-CM

## 2019-03-13 DIAGNOSIS — I10 Essential (primary) hypertension: Secondary | ICD-10-CM

## 2019-03-13 DIAGNOSIS — E785 Hyperlipidemia, unspecified: Secondary | ICD-10-CM

## 2019-03-13 DIAGNOSIS — R9431 Abnormal electrocardiogram [ECG] [EKG]: Secondary | ICD-10-CM

## 2019-03-13 MED ORDER — LOSARTAN POTASSIUM 50 MG PO TABS: 50.0000 mg | ORAL_TABLET | Freq: Every day | ORAL | 3 refills | Status: AC

## 2019-03-13 NOTE — Patient Instructions (Signed)
Medication Instructions:  INCREASE LOSARTAN TO 50 MG ONCE DAILY= 1 WHOLE 50 MG TABLET ONCE DAILY  *If you need a refill on your cardiac medications before your next appointment, please call your pharmacy*  Lab Work: If you have labs (blood work) drawn today and your tests are completely normal, you will receive your results only by: Marland Kitchen MyChart Message (if you have MyChart) OR . A paper copy in the mail If you have any lab test that is abnormal or we need to change your treatment, we will call you to review the results.  Follow-Up: At [facility], you and your health needs are our priority.  As part of our continuing mission to provide you with exceptional heart care, we have created designated Provider Care Teams.  These Care Teams include your primary Cardiologist (physician) and Advanced Practice Providers (APPs -  Physician Assistants and Nurse Practitioners) who all work together to provide you with the care you need, when you need it.  Your next appointment:   2 WEEKS FOR MED TITRATION  The format for your next appointment:   In Person  Provider:   You may see Minus Breeding, MD CARE TEAM Advanced Practice Providers on your designated Care Team:    Rosaria Ferries, PA-C  Jory Sims, DNP, ANP  Cadence Kathlen Mody, NP

## 2019-03-17 ENCOUNTER — Ambulatory Visit: Payer: Medicare Other | Admitting: Pulmonary Disease

## 2019-03-17 ENCOUNTER — Encounter: Payer: Self-pay | Admitting: Pulmonary Disease

## 2019-03-17 ENCOUNTER — Telehealth: Payer: Self-pay | Admitting: Cardiology

## 2019-03-17 ENCOUNTER — Other Ambulatory Visit: Payer: Self-pay

## 2019-03-17 VITALS — BP 118/62 | HR 75 | Temp 97.3°F | Ht 64.0 in | Wt 156.0 lb

## 2019-03-17 DIAGNOSIS — J4540 Moderate persistent asthma, uncomplicated: Secondary | ICD-10-CM | POA: Diagnosis not present

## 2019-03-17 LAB — BASIC METABOLIC PANEL
BUN: 12 mg/dL (ref 6–23)
CO2: 26 mEq/L (ref 19–32)
Calcium: 9.1 mg/dL (ref 8.4–10.5)
Chloride: 108 mEq/L (ref 96–112)
Creatinine, Ser: 1.16 mg/dL (ref 0.40–1.20)
GFR: 54.13 mL/min — ABNORMAL LOW (ref >60.00)
Glucose, Bld: 103 mg/dL — ABNORMAL HIGH (ref 70–99)
Potassium: 3.6 mEq/L (ref 3.5–5.1)
Sodium: 142 mEq/L (ref 135–145)

## 2019-03-17 LAB — CBC WITH DIFFERENTIAL/PLATELET
Basophils Absolute: 0.1 10*3/uL (ref 0.0–0.1)
Basophils Relative: 0.7 % (ref 0.0–3.0)
Eosinophils Absolute: 0.2 10*3/uL (ref 0.0–0.7)
Eosinophils Relative: 2.3 % (ref 0.0–5.0)
HCT: 38.4 % (ref 36.0–46.0)
Hemoglobin: 12.5 g/dL (ref 12.0–15.0)
Lymphocytes Relative: 24 % (ref 12.0–46.0)
Lymphs Abs: 2.1 10*3/uL (ref 0.7–4.0)
MCHC: 32.6 g/dL (ref 30.0–36.0)
MCV: 90.2 fl (ref 78.0–100.0)
Monocytes Absolute: 0.4 10*3/uL (ref 0.1–1.0)
Monocytes Relative: 4.9 % (ref 3.0–12.0)
Neutro Abs: 6.1 10*3/uL (ref 1.4–7.7)
Neutrophils Relative %: 68.1 % (ref 43.0–77.0)
Platelets: 470 10*3/uL — ABNORMAL HIGH (ref 150.0–400.0)
RBC: 4.25 Mil/uL (ref 3.87–5.11)
RDW: 14.6 % (ref 11.5–15.5)
WBC: 8.9 10*3/uL (ref 4.0–10.5)

## 2019-03-17 NOTE — Patient Instructions (Signed)
We will check some labs today including CBC differential, BMP, IgE Continue the Trelegy inhaler.  Use this instead of the Symbicort Use nicotine patches for smoking cessation Continue oxygen at night.  He can use oxygen during the daytime to maintain oxygen saturations greater than 90%  Follow-up in 4 weeks.

## 2019-03-17 NOTE — Telephone Encounter (Signed)
Spoke with pt daughter, questions regarding the recent visit with dr hochrein answered per DPR.

## 2019-03-17 NOTE — Progress Notes (Signed)
[name]    [id number]    07-03-1934  Primary Care Physician:[name], [name], FNP  Referring Physician: Sonny Masters, FNP [address] [place],  [place] [address]  Chief complaint: Follow up for COPD, asthma.  HPI: 84 year old with past medical history of asthma, heart failure GERD Seen at her primary care several times this year for asthma exacerbation treated with antibiotics and prednisone.  She had a chest x-ray in March 2019 which did not show acute abnormality.  States that her breathing is doing better since this episode.  Feels she is back to baseline.  Chief complaint is mild dyspnea on exertion.  No symptoms at rest.  No cough, sputum production, wheezing.  Maintained on breo.  There is some confusion about which inhaler she is on as there are notes from primary care stating that she also has Symbicort and trelegy. Has albuterol inhaler which she uses twice a day Diagnosed with asthma in 2013 with a positive methacholine challenge test, spirometry showing mild obstruction She has a 6 pack-year smoking history.  Continues to smoke a couple of cigarettes a day.  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 Exposures: No known exposures, no mold, hot tub, Jacuzzi Smoking history: 60-pack-year smoker.  Continues to smoke 2 to 3 cigarettes a day Travel history: No significant travel  Interim history: Hospitalized at [facility] on 11/3 to 03/11/19 with acute respiratory failure, COPD exacerbation, heart failure exacerbation.  Presenting as cardiac arrest secondary to hypercarbic respiratory failure Briefly intubated in the ICU.  Discharged on prednisone taper and Trelegy.  In the office today she states that her breathing is improving.  She has Trelegy on her med list but states that she is taking Symbicort Continues to smoke as per her daughters who participated via telephone  Outpatient Encounter Medications as of 03/17/2019   Medication Sig  . albuterol (PROVENTIL) (2.5 MG/3ML) 0.083% nebulizer solution Take 3 mLs (2.5 mg total) by nebulization See admin instructions. Inhale 2.5 mg as via nebulizer twice daily, may also use every 6 hours as needed for shortness of breath/wheezing  . albuterol (VENTOLIN HFA) 108 (90 Base) MCG/ACT inhaler Inhale 1-2 puffs into the lungs every 6 (six) hours as needed for wheezing or shortness of breath.  Marland Kitchen aspirin EC 81 MG tablet Take 81 mg by mouth every evening.   Marland Kitchen atorvastatin (LIPITOR) 40 MG tablet Take 40 mg by mouth daily.  Marland Kitchen buPROPion (WELLBUTRIN SR) 150 MG 12 hr tablet TAKE (1) TABLET TWICE A DAY. (Patient taking differently: Take 150 mg by mouth daily. )  . Capsaicin (ASPERCREME PAIN RELIEF PATCH EX) Apply 1 patch topically daily as needed.  . carvedilol (COREG) 6.25 MG tablet TAKE  (1)  TABLET TWICE A DAY WITH MEALS (BREAKFAST AND SUPPER) (Patient taking differently: Take 6.25 mg by mouth 2 (two) times daily with a meal. )  . Cholecalciferol (VITAMIN D-3) 25 MCG (1000 UT) CAPS Take 2 capsules by mouth daily.  . furosemide (LASIX) 20 MG tablet TAKE 1 TABLET ONCE DAILY (Patient taking differently: Take 20 mg by mouth daily. )  . loratadine (CLARITIN) 10 MG tablet Take 1 tablet (10 mg total) by mouth daily.  Marland Kitchen losartan (COZAAR) 50 MG tablet Take 1 tablet (50 mg total) by mouth daily.  . memantine (NAMENDA) 10 MG tablet TAKE  (1)  TABLET TWICE A DAY.  . Multiple Vitamin (MULTIVITAMIN WITH MINERALS) TABS tablet  Take 1 tablet by mouth daily. Centrum Silver  . omeprazole (PRILOSEC) 40 MG capsule Take 1 capsule (40 mg total) by mouth daily.  . raloxifene (EVISTA) 60 MG tablet TAKE 1 TABLET DAILY  . Spacer/Aero-Holding Chambers (AEROCHAMBER MV) inhaler Use as instructed  . TRELEGY ELLIPTA 100-62.5-25 MCG/INH AEPB INHALE 1 DOSE AS DIRECTEDDAILY  . [DISCONTINUED] Biotin 1000 MCG tablet Take 1,000 mcg by mouth every morning.   . [DISCONTINUED] BREO ELLIPTA 100-25 MCG/INH AEPB Inhale 1  puff into the lungs daily.  . [DISCONTINUED] gabapentin (NEURONTIN) 100 MG capsule TAKE (1) CAPSULE THREE TIMES DAILY.  . [DISCONTINUED] vitamin B-12 (CYANOCOBALAMIN) 1000 MCG tablet Take 1,000 mcg by mouth daily.   No facility-administered encounter medications on file as of 03/17/2019.    Physical Exam: Blood pressure 118/62, pulse 75, temperature (!) 97.3 F (36.3 C), temperature source Temporal, height 5\' 4"  (1.626 m), weight 156 lb (70.8 kg), SpO2 93 %. Gen:      No acute distress HEENT:  EOMI, sclera anicteric Neck:     No masses; no thyromegaly Lungs:    Clear to auscultation bilaterally; normal respiratory effort CV:         Regular rate and rhythm; no murmurs Abd:      + bowel sounds; soft, non-tender; no palpable masses, no distension Ext:    No edema; adequate peripheral perfusion Skin:      Warm and dry; no rash Neuro: alert and oriented x 3 Psych: normal mood and affect  Data Reviewed: Imaging CT chest 02/21/15- cardiomegaly, mild vascular congestion.  CT abdomen 04/08/2016- visualized lungs show mild groundglass opacities at the bases.  Chest x-ray 05/05/2018- linear opacity on the lateral view suggesting lingular collapse.  CT chest 05/05/2018 mild subsegmental atelectasis in the right middle lobe, basilar subsegmental atelectasis.  Small fat-containing diaphragmatic hernia in the right hemidiaphragm.  Stable 4 mm subpleural nodule in the right upper lobe. I have reviewed the images personally.  PFTs: PFTs with methacholine 07/19/11 FVC 2.66 [136%], FEV1 1.75 [116%], F/F 66 Mild obstruction with positive methacholine test FEV1 change of -23% at 0.25 mg/mL.  12/12/2017 FVC 2.57 [149%), FEV1 1.54 (60%), F/F 60, TLC 110%, DLCO 61% Mild obstructive airway disease, moderate diffusion defect.  Echo 07/08/2017-mild LVH, LVEF 10-62%, grade 1 diastolic dysfunction, PA peak pressure 34  Assessment:  Asthma, COPD Positive methacholine challenge in 2013 Reviewed medications  with her.  Instructed her to stop the Symbicort and start Trelegy Continue albuterol as needed Check baseline labs including CBC differential, metabolic panel, IgE Continue supplemental oxygen for now  Active smoker Strongly encouraged to quit smoking.  Nicotine patches for smoking cessation Time spent counseling-5 minutes.  Reassess on return visit  Cardiomyopathy EF 35-40% on recent hospitalization She has follow-up with cardiology already arranged.  GERD Continue Nexium  Plan/Recommendations: - Stop Symbicort, start Trelegy - Nicotine patches - Supplemental oxygen - CBC, metabolic panel, IgE   Follow-up in 4 weeks.  Marshell Garfinkel MD [facility] 03/17/2019, 2:02 PM  CC: [name], Connye Burkitt, FNP

## 2019-03-17 NOTE — Telephone Encounter (Signed)
New message    Patients daughter [name] calling to discuss 11/13 office visit Please call

## 2019-03-18 ENCOUNTER — Other Ambulatory Visit (INDEPENDENT_AMBULATORY_CARE_PROVIDER_SITE_OTHER): Payer: Medicare Other

## 2019-03-18 DIAGNOSIS — I429 Cardiomyopathy, unspecified: Secondary | ICD-10-CM

## 2019-03-18 DIAGNOSIS — I447 Left bundle-branch block, unspecified: Secondary | ICD-10-CM | POA: Diagnosis not present

## 2019-03-18 DIAGNOSIS — I5032 Chronic diastolic (congestive) heart failure: Secondary | ICD-10-CM

## 2019-03-18 DIAGNOSIS — I6529 Occlusion and stenosis of unspecified carotid artery: Secondary | ICD-10-CM

## 2019-03-18 DIAGNOSIS — I5033 Acute on chronic diastolic (congestive) heart failure: Secondary | ICD-10-CM

## 2019-03-18 DIAGNOSIS — I10 Essential (primary) hypertension: Secondary | ICD-10-CM | POA: Diagnosis not present

## 2019-03-18 LAB — IGE: IgE (Immunoglobulin E), Serum: 16 kU/L (ref <=114)

## 2019-03-19 ENCOUNTER — Other Ambulatory Visit: Payer: Self-pay | Admitting: Family Medicine

## 2019-03-25 ENCOUNTER — Ambulatory Visit: Payer: Medicare Other | Admitting: Adult Health

## 2019-03-28 ENCOUNTER — Other Ambulatory Visit: Payer: Self-pay | Admitting: Family Medicine

## 2019-03-28 DIAGNOSIS — R110 Nausea: Secondary | ICD-10-CM

## 2019-03-30 NOTE — Progress Notes (Signed)
Cardiology Office Note   Date:  03/31/2019   ID:  [name], DOB [birth year], MRN [id number]  PCP:  Sonny Mastersakes, Linda M, FNP  Cardiologist: Dr. Antoine PocheHochrein  CC: Follow Up    History of Present Illness: [name] is a 84 y.o. female who presents for ongoing assessment and management of chronic diastolic heart failure, [facility] November for respiratory failure, and respiratory arrest.  She did receive CPR.  She has other history of oxygen dependent COPD.  Echocardiogram revealed an EF of 40% to 45%.    When last seen by Dr. Antoine PocheHochrein on 03/13/2019 she was undergoing physical therapy and had a rehab facility.  He noted that she did have some episodes of confusion, which were also mentioned during her recent hospitalization at [facility].  At that most recent office visit the patient appeared to be euvolemic, with no evidence of edema or volume overload.  Cozaar was increased to 50 mg daily, and she is here for evaluation of her response to medication adjustment and further titration if possible.  She will need a follow-up echocardiogram and February or March 2021.  She was also noted to have a prolonged QT intervals on EKG.  This should be followed.  She has seen her pulmonologist Dr. Isaiah SergeMannam on 03/17/2019.  Symbicort was stopped and Trelegy was started, she was to continue albuterol.  Labs to include CBC with differential, BMET, IgE were ordered.  She was also counseled on smoking cessation. She quit smoking 5 days ago.   She is here with her daughter feeling overall fatigued. She denies chest pain, chest pressure, but does have some musculoskeletal pain on the lower left lateral chest, which is there all the time and worse with deep breathing.  She also complains of left antecubital area pain and swelling from infiltrated IV with skin sloughing noted.  She does denies numbness, tingling, or reduced range of motion.    Past Medical History:  Diagnosis Date   . Allergic rhinitis   . Anxiety   . Arthritis   . Asthma   . CAP (community acquired pneumonia) 11/26/2017  . COPD (chronic obstructive pulmonary disease) (HCC)   . Depression   . Dyspnea    with exertion   . Elevated troponin 04/05/2016  . Essential hypertension, benign   . GERD (gastroesophageal reflux disease)   . Headache    occiptal neuralgia - left   . History of blood transfusion    "related to miscarriage"  . Left bundle branch block    in & out of A fib  . Memory loss   . Mixed hyperlipidemia   . Occipital neuralgia of left side   . Osteopenia   . Secondary cardiomyopathy (HCC)    LVEF 40-45%, likely nonischemic    Past Surgical History:  Procedure Laterality Date  . APPENDECTOMY    . BACK SURGERY    . CARDIAC CATHETERIZATION  2010   Normal coronaries  . CATARACT EXTRACTION W/PHACO  04/16/2011   Procedure: CATARACT EXTRACTION PHACO AND INTRAOCULAR LENS PLACEMENT (IOC);  Surgeon: Susa Simmondsarroll F Haines;  Location: [facility];  Service: Ophthalmology;  Laterality: Left;  CDE=11.35  . CATARACT EXTRACTION W/PHACO  01/28/2012   Procedure: CATARACT EXTRACTION PHACO AND INTRAOCULAR LENS PLACEMENT (IOC);  Surgeon: Susa Simmondsarroll F Haines, MD;  Location: [facility];  Service: Ophthalmology;  Laterality: Right;  CDI:8.15  . CYSTO WITH HYDRODISTENSION  05/29/2012   Procedure: CYSTOSCOPY/HYDRODISTENSION;  Surgeon: Martina SinnerScott A MacDiarmid, MD;  Location: [facility];  Service: Urology;  Laterality: N/A;  INSTILLATION OF MARCAINE AND PYRIDIUM   . DILATION AND CURETTAGE OF UTERUS  <hysterectomy"  . EYE SURGERY    . HAMMER TOE SURGERY Bilateral 11/22/11   [facility], [facility]  . JOINT REPLACEMENT    . LAPAROSCOPIC CHOLECYSTECTOMY  1991  . LUMBAR LAMINECTOMY  2011  . LUMBAR LAMINECTOMY/DECOMPRESSION MICRODISCECTOMY Left 06/06/2012   Procedure: LUMBAR LAMINECTOMY/DECOMPRESSION MICRODISCECTOMY 1 LEVEL;  Surgeon: Maeola HarmanJoseph Stern, MD;  Location: [facility];  Service: Neurosurgery;  Laterality: Left;  Left  Lumbar two-three Laminectomy for resection of synovial cyst  . SHOULDER ARTHROSCOPY Right 02/12/2018   SHOULDER ARTHROSCOPY, BICEPS DEBRIDEMENT, REMOVAL OF LOOSE BODIES  . SHOULDER ARTHROSCOPY Right 02/12/2018   Procedure: RIGHT SHOULDER ARTHROSCOPY, BICEPS DEBRIDEMENT, REMOVAL OF LOOSE BODIES;  Surgeon: Eldred MangesYates, Mark C, MD;  Location: [facility];  Service: Orthopedics;  Laterality: Right;  . SHOULDER OPEN ROTATOR CUFF REPAIR Right 2011  . TONSILLECTOMY    . TOTAL HIP ARTHROPLASTY Right 03/13/2013   Procedure: TOTAL HIP ARTHROPLASTY;  Surgeon: Nestor LewandowskyFrank J Rowan, MD;  Location: [facility];  Service: Orthopedics;  Laterality: Right;  . TOTAL SHOULDER ARTHROPLASTY Right 07/02/2018   Procedure: RIGHT TOTAL SHOULDER ARTHROPLASTY;  Surgeon: Eldred MangesYates, Mark C, MD;  Location: [facility];  Service: Orthopedics;  Laterality: Right;  Marland Kitchen. VAGINAL HYSTERECTOMY       Current Outpatient Medications  Medication Sig Dispense Refill  . albuterol (PROVENTIL) (2.5 MG/3ML) 0.083% nebulizer solution Take 3 mLs (2.5 mg total) by nebulization See admin instructions. Inhale 2.5 mg as via nebulizer twice daily, may also use every 6 hours as needed for shortness of breath/wheezing 75 mL 0  . albuterol (VENTOLIN HFA) 108 (90 Base) MCG/ACT inhaler Inhale 1-2 puffs into the lungs every 6 (six) hours as needed for wheezing or shortness of breath. 18 g 0  . aspirin EC 81 MG tablet Take 81 mg by mouth every evening.     Marland Kitchen. atorvastatin (LIPITOR) 40 MG tablet Take 40 mg by mouth daily.    Marland Kitchen. buPROPion (WELLBUTRIN SR) 150 MG 12 hr tablet TAKE (1) TABLET TWICE A DAY. (Patient taking differently: Take 150 mg by mouth daily. ) 180 tablet 1  . Capsaicin (ASPERCREME PAIN RELIEF PATCH EX) Apply 1 patch topically daily as needed.    . carvedilol (COREG) 6.25 MG tablet TAKE  (1)  TABLET TWICE A DAY WITH MEALS (BREAKFAST AND SUPPER) (Patient taking differently: Take 6.25 mg by mouth 2 (two) times daily with a meal. ) 180 tablet 2  . Cholecalciferol (VITAMIN D-3) 25  MCG (1000 UT) CAPS Take 2 capsules by mouth daily.    . furosemide (LASIX) 20 MG tablet TAKE 1 TABLET ONCE DAILY (Patient taking differently: Take 20 mg by mouth daily. ) 90 tablet 3  . loratadine (CLARITIN) 10 MG tablet Take 1 tablet (10 mg total) by mouth daily. 30 tablet 5  . losartan (COZAAR) 50 MG tablet Take 1 tablet (50 mg total) by mouth daily. 90 tablet 3  . memantine (NAMENDA) 10 MG tablet TAKE  (1)  TABLET TWICE A DAY. 60 tablet 1  . Multiple Vitamin (MULTIVITAMIN WITH MINERALS) TABS tablet Take 1 tablet by mouth daily. Centrum Silver    . omeprazole (PRILOSEC) 40 MG capsule Take 1 capsule (40 mg total) by mouth daily. 30 capsule 11  . raloxifene (EVISTA) 60 MG tablet TAKE 1 TABLET DAILY 90 tablet 0  . Spacer/Aero-Holding Chambers (AEROCHAMBER MV) inhaler Use as instructed 1 each  0  . TRELEGY ELLIPTA 100-62.5-25 MCG/INH AEPB INHALE 1 DOSE AS DIRECTEDDAILY 60 each 0  . hydrocortisone cream 0.5 % Apply 1 application topically 2 (two) times daily as needed (for pain). 30 g 0   No current facility-administered medications for this visit.     Allergies:   Lisinopril-hydrochlorothiazide, Codeine, Penicillins, and Aricept [donepezil hcl]    Social History:  The patient  reports that she quit smoking about 6 months ago. Her smoking use included cigarettes. She quit after 60.00 years of use. She has never used smokeless tobacco. She reports previous alcohol use. She reports that she does not use drugs.   Family History:  The patient's family history includes Allergies in an other family member; Cancer (age of onset: 95) in her brother; Diabetes in her brother, brother, brother, father, sister, sister, son, and another family member; Hypertension in her sister.    ROS: All other systems are reviewed and negative. Unless otherwise mentioned in H&P    PHYSICAL EXAM: VS:  BP (!) 149/88   Pulse 94   Ht 5\' 4"  (1.626 m)   Wt 155 lb (70.3 kg)   BMI 26.61 kg/m  , BMI Body mass index is 26.61  kg/m. GEN: Well nourished, well developed, in no acute distress HEENT: normal Neck: no JVD, carotid bruits, or masses Cardiac: IRRR; no murmurs, rubs, or gallops,no edema  Respiratory:  Clear to auscultation bilaterally, normal work of breathing GI: soft, nontender, nondistended, + BS MS: Left antecubital exquisitely painful to palpation, small amount of edema, extensive skin sloughing. Skin: warm and dry, no rash Neuro:  Strength and sensation are intact Psych: euthymic mood, full affect   EKG: Sinus rhythm with PVCs.  Heart rate of 83 bpm left bundle branch block (unchanged from prior EKG dated 03/13/2019).  Recent Labs: 10/14/2018: BNP 168.7; Magnesium 2.1; TSH 0.832 01/23/2019: ALT 15 03/17/2019: BUN 12; Creatinine, Ser 1.16; Hemoglobin 12.5; Platelets 470.0; Potassium 3.6; Sodium 142    Lipid Panel    Component Value Date/Time   CHOL 176 09/30/2018 0843   CHOL 149 10/27/2012 1028   TRIG 117 09/30/2018 0843   TRIG 61 01/26/2013 0927   TRIG 72 10/27/2012 1028   HDL 73 09/30/2018 0843   HDL 76 01/26/2013 0927   HDL 59 10/27/2012 1028   CHOLHDL 2.4 09/30/2018 0843   LDLCALC 80 09/30/2018 0843   LDLCALC 98 01/26/2013 0927   LDLCALC 76 10/27/2012 1028   LDLDIRECT 135 (H) 06/12/2017 1503      Wt Readings from Last 3 Encounters:  03/31/19 155 lb (70.3 kg)  03/17/19 156 lb (70.8 kg)  03/13/19 156 lb 9.6 oz (71 kg)      Other studies Reviewed:  Echo 11/27/2017 Left ventricle: The cavity size was normal. There was mild focal   basal hypertrophy of the septum. Systolic function was normal.   The estimated ejection fraction was in the range of 55% to 60%.   Wall motion was normal; there were no regional wall motion   abnormalities. Doppler parameters are consistent with abnormal   left ventricular relaxation (grade 1 diastolic dysfunction). - Aortic valve: There was trivial regurgitation. - Mitral valve: Calcified annulus.  Stress Myoview 08/21/2018 Study Highlights     The left ventricular ejection fraction is moderately decreased (30-44%).  Nuclear stress EF: 38%.  Defect 1: There is a medium defect of moderate severity present in the basal inferoseptal, mid inferoseptal, apical septal and apical inferior location.  This is an intermediate risk study.  Abnormal intermediate stress nuclear study with inferoseptal thinning vs prior infarct; no ischemia; EF 38 with global hypokinesis (worse in the apical inferoseptal wall); mild LVE; study interpreted as intermediate risk due to reduced LV function.     ASSESSMENT AND PLAN:  1.  Chronic diastolic CHF: No evidence of fluid overload today.  Weight has been maintained.  He will continue carvedilol 6.25 mg twice daily, furosemide 20 mg daily, she is to report any fluid retention, worsening dyspnea or weight gain.  She is to adhere to a low-sodium diet.  2.  Left arm thrombophlebitis: Doppler ultrasound was completed here in the office during this visit to rule out DVT there was evidence of left cephalic vein superficial thrombosis, with thrombophlebitis..  The patient was to be treated symptomatically with pain control with Tylenol, and warm moist compresses.  If symptoms worsen she was to see PCP or go to ER.  She states that actually it is feeling better since discharge.  She is more concerned with skin sloughing.  I am going to provide her with topical hydrocortisone to administer twice daily.  3.  Hypertension: I rechecked her blood pressure in the exam room.  Blood pressure 132/60.  No changes in her medication regimen.  4.  COPD: Follow-up with pulmonary and continue inhalers and Trelegy as directed.  Current medicines are reviewed at length with the patient today.    Labs/ tests ordered today include: None  Bettey Mare. Liborio Nixon, ANP, AACC   03/31/2019 1:20 PM    [facility] [address] Office [phone] Fax [phone]  Notice: This dictation  was prepared with Dragon dictation along with smaller phrase technology. Any transcriptional errors that result from this process are unintentional and may not be corrected upon review.

## 2019-03-31 ENCOUNTER — Encounter (HOSPITAL_COMMUNITY): Payer: Self-pay

## 2019-03-31 ENCOUNTER — Other Ambulatory Visit: Payer: Self-pay

## 2019-03-31 ENCOUNTER — Ambulatory Visit (HOSPITAL_COMMUNITY)
Admission: RE | Admit: 2019-03-31 | Discharge: 2019-03-31 | Disposition: A | Discharge status: Home / Self Care | Payer: Medicare Other | Source: Ambulatory Visit | Attending: Cardiology | Admitting: Cardiology

## 2019-03-31 ENCOUNTER — Encounter: Payer: Self-pay | Admitting: Adult Health

## 2019-03-31 ENCOUNTER — Ambulatory Visit: Payer: Medicare Other | Admitting: Adult Health

## 2019-03-31 VITALS — BP 149/88 | HR 94 | Ht 64.0 in | Wt 155.0 lb

## 2019-03-31 DIAGNOSIS — M79602 Pain in left arm: Secondary | ICD-10-CM | POA: Diagnosis not present

## 2019-03-31 DIAGNOSIS — J439 Emphysema, unspecified: Secondary | ICD-10-CM

## 2019-03-31 DIAGNOSIS — I808 Phlebitis and thrombophlebitis of other sites: Secondary | ICD-10-CM | POA: Diagnosis not present

## 2019-03-31 DIAGNOSIS — I10 Essential (primary) hypertension: Secondary | ICD-10-CM | POA: Diagnosis not present

## 2019-03-31 DIAGNOSIS — I5032 Chronic diastolic (congestive) heart failure: Secondary | ICD-10-CM

## 2019-03-31 MED ORDER — HYDROCORTISONE 0.5 % EX CREA: 1.0000 "application " | TOPICAL_CREAM | Freq: Two times a day (BID) | CUTANEOUS | 0 refills | Status: AC | PRN

## 2019-03-31 NOTE — Patient Instructions (Signed)
Medication Instructions:  Your physician recommends that you continue on your current medications as directed. Please refer to the Current Medication list given to you today.  *If you need a refill on your cardiac medications before your next appointment, please call your pharmacy*   Follow-Up: At [facility], you and your health needs are our priority.  As part of our continuing mission to provide you with exceptional heart care, we have created designated Provider Care Teams.  These Care Teams include your primary Cardiologist (physician) and Advanced Practice Providers (APPs -  Physician Assistants and Nurse Practitioners) who all work together to provide you with the care you need, when you need it.  Your next appointment:   3 month(s)  The format for your next appointment:   In person  Provider:   You may see Minus Breeding, MD or one of the following Advanced Practice Providers on your designated Care Team:    Rosaria Ferries, PA-C  Jory Sims, DNP, ANP  Cadence Kathlen Mody, NP

## 2019-03-31 NOTE — Progress Notes (Signed)
Left upper extremity venous duplex completed. Evidence of left cephalic vein superficial thrombosis. See final report under Chart Review: CV proc. Findings reported directly to [employer].  Patient given further instructions for pain relief.

## 2019-04-01 ENCOUNTER — Telehealth: Payer: Self-pay | Admitting: Family Medicine

## 2019-04-01 NOTE — Telephone Encounter (Signed)
REFERRAL REQUEST Telephone Note  What type of referral do you need? Hearing test  Have you been seen at our office for this problem? No. Patients memory is getting worse and wants a hearing test. (Advise that they will likely need an appointment with their PCP before a referral can be done)  Is there a particular doctor or location that you prefer? Local if possible  Patient notified that referrals can take up to a week or longer to process. If they haven't heard anything within a week they should call back and speak with the referral department.

## 2019-04-01 NOTE — Telephone Encounter (Signed)
She has an upcoming appointment and we will discuss this then.

## 2019-04-02 ENCOUNTER — Other Ambulatory Visit: Payer: Self-pay | Admitting: Family Medicine

## 2019-04-02 ENCOUNTER — Telehealth: Payer: Self-pay | Admitting: Family Medicine

## 2019-04-02 DIAGNOSIS — M5136 Other intervertebral disc degeneration, lumbar region: Secondary | ICD-10-CM

## 2019-04-02 DIAGNOSIS — M25511 Pain in right shoulder: Secondary | ICD-10-CM

## 2019-04-02 DIAGNOSIS — J418 Mixed simple and mucopurulent chronic bronchitis: Secondary | ICD-10-CM | POA: Insufficient documentation

## 2019-04-02 DIAGNOSIS — G8929 Other chronic pain: Secondary | ICD-10-CM

## 2019-04-02 DIAGNOSIS — M8589 Other specified disorders of bone density and structure, multiple sites: Secondary | ICD-10-CM

## 2019-04-02 NOTE — Telephone Encounter (Signed)
RX sent

## 2019-04-02 NOTE — Telephone Encounter (Signed)
Pt's daughter aware of provider feedback and voiced understanding. °

## 2019-04-03 ENCOUNTER — Telehealth: Payer: Self-pay

## 2019-04-08 ENCOUNTER — Telehealth: Payer: Self-pay | Admitting: Family Medicine

## 2019-04-08 ENCOUNTER — Ambulatory Visit: Payer: Medicare Other | Admitting: Family Medicine

## 2019-04-09 ENCOUNTER — Other Ambulatory Visit: Payer: Self-pay

## 2019-04-09 ENCOUNTER — Encounter: Payer: Self-pay | Admitting: Family Medicine

## 2019-04-09 ENCOUNTER — Ambulatory Visit (INDEPENDENT_AMBULATORY_CARE_PROVIDER_SITE_OTHER): Payer: Medicare Other | Admitting: Family Medicine

## 2019-04-09 VITALS — BP 150/60 | HR 75 | Temp 97.2°F | Resp 20 | Ht 64.0 in | Wt 153.0 lb

## 2019-04-09 DIAGNOSIS — F03A0 Unspecified dementia, mild, without behavioral disturbance, psychotic disturbance, mood disturbance, and anxiety: Secondary | ICD-10-CM

## 2019-04-09 DIAGNOSIS — F339 Major depressive disorder, recurrent, unspecified: Secondary | ICD-10-CM

## 2019-04-09 DIAGNOSIS — F0390 Unspecified dementia without behavioral disturbance: Secondary | ICD-10-CM | POA: Diagnosis not present

## 2019-04-09 DIAGNOSIS — I10 Essential (primary) hypertension: Secondary | ICD-10-CM

## 2019-04-09 DIAGNOSIS — J418 Mixed simple and mucopurulent chronic bronchitis: Secondary | ICD-10-CM | POA: Diagnosis not present

## 2019-04-09 DIAGNOSIS — Z09 Encounter for follow-up examination after completed treatment for conditions other than malignant neoplasm: Secondary | ICD-10-CM

## 2019-04-09 DIAGNOSIS — I5032 Chronic diastolic (congestive) heart failure: Secondary | ICD-10-CM | POA: Diagnosis not present

## 2019-04-09 DIAGNOSIS — R0609 Other forms of dyspnea: Secondary | ICD-10-CM

## 2019-04-09 DIAGNOSIS — R0600 Dyspnea, unspecified: Secondary | ICD-10-CM

## 2019-04-09 DIAGNOSIS — R531 Weakness: Secondary | ICD-10-CM

## 2019-04-09 NOTE — Patient Instructions (Signed)
COPD and Physical Activity °Chronic obstructive pulmonary disease (COPD) is a long-term (chronic) condition that affects the lungs. COPD is a general term that can be used to describe many different lung problems that cause lung swelling (inflammation) and limit airflow, including chronic bronchitis and emphysema. °The main symptom of COPD is shortness of breath, which makes it harder to do even simple tasks. This can also make it harder to exercise and be active. Talk with your health care provider about treatments to help you breathe better and actions you can take to prevent breathing problems during physical activity. °What are the benefits of exercising with COPD? °Exercising regularly is an important part of a healthy lifestyle. You can still exercise and do physical activities even though you have COPD. Exercise and physical activity improve your shortness of breath by increasing blood flow (circulation). This causes your heart to pump more oxygen through your body. Moderate exercise can improve your: °· Oxygen use. °· Energy level. °· Shortness of breath. °· Strength in your breathing muscles. °· Heart health. °· Sleep. °· Self-esteem and feelings of self-worth. °· Depression, stress, and anxiety levels. °Exercise can benefit everyone with COPD. The severity of your disease may affect how hard you can exercise, especially at first, but everyone can benefit. Talk with your health care provider about how much exercise is safe for you, and which activities and exercises are safe for you. °What actions can I take to prevent breathing problems during physical activity? °· Sign up for a pulmonary rehabilitation program. This type of program may include: °? Education about lung diseases. °? Exercise classes that teach you how to exercise and be more active while improving your breathing. This usually involves: °§ Exercise using your lower extremities, such as a stationary bicycle. °§ About 30 minutes of exercise, 2  to 5 times per week, for 6 to 12 weeks °§ Strength training, such as push ups or leg lifts. °? Nutrition education. °? Group classes in which you can talk with others who also have COPD and learn ways to manage stress. °· If you use an oxygen tank, you should use it while you exercise. Work with your health care provider to adjust your oxygen for your physical activity. Your resting flow rate is different from your flow rate during physical activity. °· While you are exercising: °? Take slow breaths. °? Pace yourself and do not try to go too fast. °? Purse your lips while breathing out. Pursing your lips is similar to a kissing or whistling position. °? If doing exercise that uses a quick burst of effort, such as weight lifting: °§ Breathe in before starting the exercise. °§ Breathe out during the hardest part of the exercise (such as raising the weights). °Where to find support °You can find support for exercising with COPD from: °· Your health care provider. °· A pulmonary rehabilitation program. °· Your local health department or community health programs. °· Support groups, online or in-person. Your health care provider may be able to recommend support groups. °Where to find more information °You can find more information about exercising with COPD from: °· American Lung Association: lung.org. °· COPD Foundation: copdfoundation.org. °Contact a health care provider if: °· Your symptoms get worse. °· You have chest pain. °· You have nausea. °· You have a fever. °· You have trouble talking or catching your breath. °· You want to start a new exercise program or a new activity. °Summary °· COPD is a general term that can   be used to describe many different lung problems that cause lung swelling (inflammation) and limit airflow. This includes chronic bronchitis and emphysema. °· Exercise and physical activity improve your shortness of breath by increasing blood flow (circulation). This causes your heart to provide more  oxygen to your body. °· Contact your health care provider before starting any exercise program or new activity. Ask your health care provider what exercises and activities are safe for you. °This information is not intended to replace advice given to you by your health care provider. Make sure you discuss any questions you have with your health care provider. °Document Released: 05/09/2017 Document Revised: 08/06/2018 Document Reviewed: 05/09/2017 °Elsevier Patient Education © 2020 [employer] ° °

## 2019-04-10 ENCOUNTER — Other Ambulatory Visit: Payer: Self-pay

## 2019-04-10 LAB — CMP14+EGFR
ALT: 20 IU/L (ref 0–32)
AST: 28 IU/L (ref 0–40)
Albumin/Globulin Ratio: 1.7 (ref 1.2–2.2)
Albumin: 4.1 g/dL (ref 3.6–4.6)
Alkaline Phosphatase: 139 IU/L — ABNORMAL HIGH (ref 39–117)
BUN/Creatinine Ratio: 10 — ABNORMAL LOW (ref 12–28)
BUN: 12 mg/dL (ref 8–27)
Bilirubin Total: 0.3 mg/dL (ref 0.0–1.2)
CO2: 22 mmol/L (ref 20–29)
Calcium: 9.4 mg/dL (ref 8.7–10.3)
Chloride: 108 mmol/L — ABNORMAL HIGH (ref 96–106)
Creatinine, Ser: 1.2 mg/dL — ABNORMAL HIGH (ref 0.57–1.00)
GFR calc Af Amer: 49 mL/min/{1.73_m2} — ABNORMAL LOW (ref >59)
GFR calc non Af Amer: 42 mL/min/{1.73_m2} — ABNORMAL LOW (ref >59)
Globulin, Total: 2.4 g/dL (ref 1.5–4.5)
Glucose: 91 mg/dL (ref 65–99)
Potassium: 3.8 mmol/L (ref 3.5–5.2)
Sodium: 147 mmol/L — ABNORMAL HIGH (ref 134–144)
Total Protein: 6.5 g/dL (ref 6.0–8.5)

## 2019-04-10 LAB — CBC WITH DIFFERENTIAL/PLATELET
Basophils Absolute: 0 10*3/uL (ref 0.0–0.2)
Basos: 1 %
EOS (ABSOLUTE): 0.3 10*3/uL (ref 0.0–0.4)
Eos: 4 %
Hematocrit: 40.5 % (ref 34.0–46.6)
Hemoglobin: 13.3 g/dL (ref 11.1–15.9)
Immature Grans (Abs): 0 10*3/uL (ref 0.0–0.1)
Immature Granulocytes: 0 %
Lymphocytes Absolute: 2.2 10*3/uL (ref 0.7–3.1)
Lymphs: 32 %
MCH: 29 pg (ref 26.6–33.0)
MCHC: 32.8 g/dL (ref 31.5–35.7)
MCV: 88 fL (ref 79–97)
Monocytes Absolute: 0.5 10*3/uL (ref 0.1–0.9)
Monocytes: 7 %
Neutrophils Absolute: 3.8 10*3/uL (ref 1.4–7.0)
Neutrophils: 56 %
Platelets: 289 10*3/uL (ref 150–450)
RBC: 4.58 x10E6/uL (ref 3.77–5.28)
RDW: 13.3 % (ref 11.7–15.4)
WBC: 6.8 10*3/uL (ref 3.4–10.8)

## 2019-04-10 MED ORDER — ATORVASTATIN CALCIUM 40 MG PO TABS: 40.0000 mg | ORAL_TABLET | Freq: Every day | ORAL | 1 refills | Status: AC

## 2019-04-10 NOTE — Progress Notes (Signed)
Renal function remains declined. Make sure you are drinking enough water and taking medications as prescribed. Blood pressure control is important to prevent further decline in renal function. We will continue to monitor and adjust therapies as needed. Alk phos was elevated. I want to recheck this fasting at next visit.  CBC is normal.

## 2019-04-10 NOTE — Telephone Encounter (Signed)
Received a call from [place].  Stating that patient got switched to Atorvastatin 40mg  in the hospital and she was only sent home with 15 pills.  Requesting a refill- please advise

## 2019-04-10 NOTE — Progress Notes (Signed)
Subjective:  Patient ID: [name], female    DOB: [birth year], 84 y.o.   MRN: [id number]  Patient Care Team: Baruch Gouty, FNP as PCP - General (Family Medicine) Minus Breeding, MD as PCP - Cardiology (Cardiology) Shea Evans [name], LCSW as Social Worker (Licensed Clinical Social Worker)   Chief Complaint:  Hospitalization Follow-up (and [facility] follow up )   HPI: [name] is a 84 y.o. female presenting on 04/09/2019 for Hospitalization Follow-up (and [facility] follow up )   Pt presents today for hospital discharge and [facility] discharge follow up.  Pt had a respiratory arrest that lead to cardiac arrest on 03/03/2019. Pt received 6 minutes of CPR and one round of epi by EMS with ROSC. Pt was taken to [facility] where she was admitted to the ICU. She was intubated for a few days. Pt states she is unsure of what happened. States she was at work at [employer] and was not feeling good so she went to leave and "passed out" in her car. Pt states she then remembers waking up in the hospital. She was discharged to a SNF on 03/11/2019 and stayed for 2 weeks and was discharged home. States she is doing ok since being home. States she has generalized weakness and fatigue. She still has DOE. She wears her oxygen for several hours in the day. States she has to use this when she is trying to complete tasks at home. She does have a son in the home but he is disabled and is unable to help with her care. She has to help with some of his care. She states she has not been able to cook due to her weakness, states she has been getting take out food. She has trouble showering because she tires out. States she can shower but it wears her out.  She reports she has been taking her medications as prescribed but she is forgetful.  She denies chest pain or palpitations. No resting shortness of breath. No significant weight changes or lower extremity swelling.  She has followed up with  cardiology and pulmonology since discharge from [facility]. Notes were reviewed.     Relevant past medical, surgical, family, and social history reviewed and updated as indicated.  Allergies and medications reviewed and updated. Date reviewed: Chart in Epic.   Past Medical History:  Diagnosis Date  . Allergic rhinitis   . Anxiety   . Arthritis   . Asthma   . CAP (community acquired pneumonia) 11/26/2017  . COPD (chronic obstructive pulmonary disease) ([place])   . Depression   . Dyspnea    with exertion   . Elevated troponin 04/05/2016  . Essential hypertension, benign   . GERD (gastroesophageal reflux disease)   . Headache    occiptal neuralgia - left   . History of blood transfusion    "related to miscarriage"  . Left bundle branch block    in & out of A fib  . Memory loss   . Mixed hyperlipidemia   . Occipital neuralgia of left side   . Osteopenia   . Secondary cardiomyopathy ([place])    LVEF 40-45%, likely nonischemic    Past Surgical History:  Procedure Laterality Date  . APPENDECTOMY    . BACK SURGERY    . CARDIAC CATHETERIZATION  2010   Normal coronaries  . CATARACT EXTRACTION W/PHACO  04/16/2011   Procedure: CATARACT EXTRACTION PHACO AND INTRAOCULAR LENS PLACEMENT (IOC);  Surgeon:  Williams Che;  Location: [facility];  Service: Ophthalmology;  Laterality: Left;  CDE=11.35  . CATARACT EXTRACTION W/PHACO  01/28/2012   Procedure: CATARACT EXTRACTION PHACO AND INTRAOCULAR LENS PLACEMENT (IOC);  Surgeon: Williams Che, MD;  Location: [facility];  Service: Ophthalmology;  Laterality: Right;  CDI:8.15  . CYSTO WITH HYDRODISTENSION  05/29/2012   Procedure: CYSTOSCOPY/HYDRODISTENSION;  Surgeon: Reece Packer, MD;  Location: [facility];  Service: Urology;  Laterality: N/A;  INSTILLATION OF MARCAINE AND PYRIDIUM   . DILATION AND CURETTAGE OF UTERUS  <hysterectomy"  . EYE SURGERY    . HAMMER TOE SURGERY Bilateral 11/22/11   Daniel, Irving Shows  . JOINT REPLACEMENT    .  LAPAROSCOPIC CHOLECYSTECTOMY  1991  . LUMBAR LAMINECTOMY  2011  . LUMBAR LAMINECTOMY/DECOMPRESSION MICRODISCECTOMY Left 06/06/2012   Procedure: LUMBAR LAMINECTOMY/DECOMPRESSION MICRODISCECTOMY 1 LEVEL;  Surgeon: Erline Levine, MD;  Location: [facility];  Service: Neurosurgery;  Laterality: Left;  Left Lumbar two-three Laminectomy for resection of synovial cyst  . SHOULDER ARTHROSCOPY Right 02/12/2018   SHOULDER ARTHROSCOPY, BICEPS DEBRIDEMENT, REMOVAL OF LOOSE BODIES  . SHOULDER ARTHROSCOPY Right 02/12/2018   Procedure: RIGHT SHOULDER ARTHROSCOPY, BICEPS DEBRIDEMENT, REMOVAL OF LOOSE BODIES;  Surgeon: Marybelle Killings, MD;  Location: [place];  Service: Orthopedics;  Laterality: Right;  . SHOULDER OPEN ROTATOR CUFF REPAIR Right 2011  . TONSILLECTOMY    . TOTAL HIP ARTHROPLASTY Right 03/13/2013   Procedure: TOTAL HIP ARTHROPLASTY;  Surgeon: Kerin Salen, MD;  Location: [place];  Service: Orthopedics;  Laterality: Right;  . TOTAL SHOULDER ARTHROPLASTY Right 07/02/2018   Procedure: RIGHT TOTAL SHOULDER ARTHROPLASTY;  Surgeon: Marybelle Killings, MD;  Location: [place];  Service: Orthopedics;  Laterality: Right;  Marland Kitchen VAGINAL HYSTERECTOMY      Social History   Socioeconomic History  . Marital status: Widowed    Spouse name: Not on file  . Number of children: 5  . Years of education: 8th grade  . Highest education level: Not on file  Occupational History  . Occupation: Worked in [employer] representative for 30 years    Employer: RETIRED    Comment: Retired  . Occupation: Part time care giver  Tobacco Use  . Smoking status: Former Smoker    Years: 60.00    Types: Cigarettes    Quit date: 09/12/2018    Years since quitting: 0.5  . Smokeless tobacco: Never Used  Substance and Sexual Activity  . Alcohol use: Not Currently    Comment: 11/27/2017 "drank some in my 20s"  . Drug use: No  . Sexual activity: Not Currently  Other Topics Concern  . Not on file  Social History Narrative   Ms Lardner is  divorced. Her adult son lives with her and he is disabled due to bilateral AKA due to diabetes.  She helps to provide care to him. She has three adult daughters that live in the [place] area. She talks with them daily and they come to visit once or twice a year. She has 7 grandchildren also.    Social Determinants of Health   Financial Resource Strain:   . Difficulty of Paying Living Expenses: Not on file  Food Insecurity: Unknown  . Worried About Charity fundraiser in the Last Year: Never true  . Ran Out of Food in the Last Year: Not on file  Transportation Needs:   . Lack of Transportation (Medical): Not on file  . Lack of Transportation (Non-Medical): Not on file  Physical  Activity:   . Days of Exercise per Week: Not on file  . Minutes of Exercise per Session: Not on file  Stress:   . Feeling of Stress : Not on file  Social Connections:   . Frequency of Communication with Friends and Family: Not on file  . Frequency of Social Gatherings with Friends and Family: Not on file  . Attends Religious Services: Not on file  . Active Member of Clubs or Organizations: Not on file  . Attends Archivist Meetings: Not on file  . Marital Status: Not on file  Intimate Partner Violence:   . Fear of Current or Ex-Partner: Not on file  . Emotionally Abused: Not on file  . Physically Abused: Not on file  . Sexually Abused: Not on file    Outpatient Encounter Medications as of 04/09/2019  Medication Sig  . albuterol (PROVENTIL) (2.5 MG/3ML) 0.083% nebulizer solution Take 3 mLs (2.5 mg total) by nebulization See admin instructions. Inhale 2.5 mg as via nebulizer twice daily, may also use every 6 hours as needed for shortness of breath/wheezing  . albuterol (VENTOLIN HFA) 108 (90 Base) MCG/ACT inhaler Inhale 1-2 puffs into the lungs every 6 (six) hours as needed for wheezing or shortness of breath.  Marland Kitchen aspirin EC 81 MG tablet Take 81 mg by mouth every evening.   Marland Kitchen atorvastatin (LIPITOR) 40 MG  tablet Take 40 mg by mouth daily.  Marland Kitchen buPROPion (WELLBUTRIN SR) 150 MG 12 hr tablet TAKE (1) TABLET TWICE A DAY. (Patient taking differently: Take 150 mg by mouth daily. )  . Capsaicin (ASPERCREME PAIN RELIEF PATCH EX) Apply 1 patch topically daily as needed.  . carvedilol (COREG) 6.25 MG tablet TAKE  (1)  TABLET TWICE A DAY WITH MEALS (BREAKFAST AND SUPPER) (Patient taking differently: Take 6.25 mg by mouth 2 (two) times daily with a meal. )  . Cholecalciferol (VITAMIN D-3) 25 MCG (1000 UT) CAPS Take 2 capsules by mouth daily.  . furosemide (LASIX) 20 MG tablet TAKE 1 TABLET ONCE DAILY (Patient taking differently: Take 20 mg by mouth daily. )  . hydrocortisone cream 0.5 % Apply 1 application topically 2 (two) times daily as needed (for pain).  Marland Kitchen loratadine (CLARITIN) 10 MG tablet Take 1 tablet (10 mg total) by mouth daily.  Marland Kitchen losartan (COZAAR) 50 MG tablet Take 1 tablet (50 mg total) by mouth daily.  . memantine (NAMENDA) 10 MG tablet TAKE  (1)  TABLET TWICE A DAY.  . Multiple Vitamin (MULTIVITAMIN WITH MINERALS) TABS tablet Take 1 tablet by mouth daily. Centrum Silver  . omeprazole (PRILOSEC) 40 MG capsule Take 1 capsule (40 mg total) by mouth daily.  . raloxifene (EVISTA) 60 MG tablet TAKE 1 TABLET DAILY  . Spacer/Aero-Holding Chambers (AEROCHAMBER MV) inhaler Use as instructed  . TRELEGY ELLIPTA 100-62.5-25 MCG/INH AEPB INHALE 1 DOSE AS DIRECTEDDAILY   No facility-administered encounter medications on file as of 04/09/2019.    Allergies  Allergen Reactions  . Lisinopril-Hydrochlorothiazide Swelling, Other (See Comments) and Cough    Lip swelling and cough. Stopped by ENT. Patient is on Losartan without any issues   . Codeine Itching and Swelling    eye irritation (prescribed percocet)  . Penicillins Itching and Swelling    PATIENT HAS HAD A PCN REACTION WITH IMMEDIATE RASH, FACIAL/TONGUE/THROAT SWELLING, SOB, OR LIGHTHEADEDNESS WITH HYPOTENSION:  yes Has patient had a PCN reaction  causing severe rash involving mucus membranes or skin necrosis: No Has patient had a PCN reaction that required  hospitalization No Has patient had a PCN reaction occurring within the last 10 years: No eye irritation  . Aricept [Donepezil Hcl] Nausea And Vomiting    Review of Systems  Constitutional: Positive for activity change, appetite change and fatigue. Negative for chills, diaphoresis, fever and unexpected weight change.  HENT: Negative.   Eyes: Negative.  Negative for photophobia and visual disturbance.  Respiratory: Positive for cough, shortness of breath and wheezing. Negative for chest tightness.   Cardiovascular: Negative for chest pain, palpitations and leg swelling.  Gastrointestinal: Negative for abdominal pain, blood in stool, constipation, diarrhea, nausea and vomiting.  Endocrine: Negative.  Negative for cold intolerance, heat intolerance, polydipsia, polyphagia and polyuria.  Genitourinary: Negative for decreased urine volume, difficulty urinating, dysuria, frequency and urgency.  Musculoskeletal: Positive for arthralgias and myalgias.  Skin: Negative.  Negative for color change and rash.  Allergic/Immunologic: Negative.   Neurological: Positive for weakness. Negative for dizziness, tremors, seizures, syncope, facial asymmetry, speech difficulty, light-headedness, numbness and headaches.  Hematological: Negative.   Psychiatric/Behavioral: Positive for confusion. Negative for hallucinations, sleep disturbance and suicidal ideas.  All other systems reviewed and are negative.       Objective:  BP (!) 150/60   Pulse 75   Temp (!) 97.2 F (36.2 C)   Resp 20   Ht 5' 4"  (1.626 m)   Wt 153 lb (69.4 kg)   SpO2 95%   BMI 26.26 kg/m    Wt Readings from Last 3 Encounters:  04/09/19 153 lb (69.4 kg)  03/31/19 155 lb (70.3 kg)  03/17/19 156 lb (70.8 kg)    Physical Exam Vitals and nursing note reviewed.  Constitutional:      General: She is not in acute distress.     Appearance: Normal appearance. She is well-developed, well-groomed and overweight. She is not ill-appearing, toxic-appearing or diaphoretic.  HENT:     Head: Normocephalic and atraumatic.     Jaw: There is normal jaw occlusion.     Right Ear: Hearing normal.     Left Ear: Hearing normal.     Nose: Nose normal.     Mouth/Throat:     Lips: Pink.     Mouth: Mucous membranes are moist.     Pharynx: Oropharynx is clear. Uvula midline.  Eyes:     General: Lids are normal.     Extraocular Movements: Extraocular movements intact.     Conjunctiva/sclera: Conjunctivae normal.     Pupils: Pupils are equal, round, and reactive to light.  Neck:     Thyroid: No thyroid mass, thyromegaly or thyroid tenderness.     Vascular: No carotid bruit or JVD.     Trachea: Trachea and phonation normal.  Cardiovascular:     Rate and Rhythm: Normal rate and regular rhythm.     Chest Wall: PMI is not displaced.     Pulses: Normal pulses.     Heart sounds: Normal heart sounds. No murmur. No friction rub. No gallop.   Pulmonary:     Effort: Pulmonary effort is normal. No respiratory distress.     Breath sounds: No stridor. Wheezing (mild expiratory ) present. No rhonchi or rales.  Chest:     Chest wall: No tenderness.  Abdominal:     General: Bowel sounds are normal. There is no distension or abdominal bruit.     Palpations: Abdomen is soft. There is no hepatomegaly or splenomegaly.     Tenderness: There is no abdominal tenderness. There is no right CVA tenderness or left  CVA tenderness.     Hernia: No hernia is present.  Musculoskeletal:        General: Normal range of motion.     Cervical back: Normal range of motion and neck supple.     Right lower leg: No edema.     Left lower leg: No edema.  Lymphadenopathy:     Cervical: No cervical adenopathy.  Skin:    General: Skin is warm and dry.     Capillary Refill: Capillary refill takes less than 2 seconds.     Coloration: Skin is not cyanotic, jaundiced  or pale.     Findings: No rash.  Neurological:     General: No focal deficit present.     Mental Status: She is alert. Mental status is at baseline.     Cranial Nerves: Cranial nerves are intact.     Sensory: Sensation is intact.     Motor: Weakness (generalized) present.     Coordination: Coordination is intact.     Gait: Gait is intact. Gait normal.     Deep Tendon Reflexes: Reflexes are normal and symmetric.  Psychiatric:        Attention and Perception: Attention and perception normal.        Mood and Affect: Mood and affect normal.        Speech: Speech normal.        Behavior: Behavior normal. Behavior is cooperative.        Thought Content: Thought content normal.        Cognition and Memory: Cognition and memory normal.        Judgment: Judgment normal.     Results for orders placed or performed in visit on 04/09/19  CMP14+EGFR  Result Value Ref Range   Glucose 91 65 - 99 mg/dL   BUN 12 8 - 27 mg/dL   Creatinine, Ser 1.20 (H) 0.57 - 1.00 mg/dL   GFR calc non Af Amer 42 (L) >59 mL/min/1.73   GFR calc Af Amer 49 (L) >59 mL/min/1.73   BUN/Creatinine Ratio 10 (L) 12 - 28   Sodium 147 (H) 134 - 144 mmol/L   Potassium 3.8 3.5 - 5.2 mmol/L   Chloride 108 (H) 96 - 106 mmol/L   CO2 22 20 - 29 mmol/L   Calcium 9.4 8.7 - 10.3 mg/dL   Total Protein 6.5 6.0 - 8.5 g/dL   Albumin 4.1 3.6 - 4.6 g/dL   Globulin, Total 2.4 1.5 - 4.5 g/dL   Albumin/Globulin Ratio 1.7 1.2 - 2.2   Bilirubin Total 0.3 0.0 - 1.2 mg/dL   Alkaline Phosphatase 139 (H) 39 - 117 IU/L   AST 28 0 - 40 IU/L   ALT 20 0 - 32 IU/L  CBC with Differential/Platelet  Result Value Ref Range   WBC 6.8 3.4 - 10.8 x10E3/uL   RBC 4.58 3.77 - 5.28 x10E6/uL   Hemoglobin 13.3 11.1 - 15.9 g/dL   Hematocrit 40.5 34.0 - 46.6 %   MCV 88 79 - 97 fL   MCH 29.0 26.6 - 33.0 pg   MCHC 32.8 31.5 - 35.7 g/dL   RDW 13.3 11.7 - 15.4 %   Platelets 289 150 - 450 x10E3/uL   Neutrophils 56 Not Estab. %   Lymphs 32 Not Estab. %    Monocytes 7 Not Estab. %   Eos 4 Not Estab. %   Basos 1 Not Estab. %   Neutrophils Absolute 3.8 1.4 - 7.0 x10E3/uL   Lymphocytes Absolute 2.2 0.7 -  3.1 x10E3/uL   Monocytes Absolute 0.5 0.1 - 0.9 x10E3/uL   EOS (ABSOLUTE) 0.3 0.0 - 0.4 x10E3/uL   Basophils Absolute 0.0 0.0 - 0.2 x10E3/uL   Immature Granulocytes 0 Not Estab. %   Immature Grans (Abs) 0.0 0.0 - 0.1 x10E3/uL       Pertinent labs & imaging results that were available during my care of the patient were reviewed by me and considered in my medical decision making.  Assessment & Plan:  Anayely was seen today for hospitalization follow-up.  Diagnoses and all orders for this visit:  Hospital discharge follow-up Will place referral for home health. Labs pending.  -     CMP14+EGFR -     CBC with Differential/Platelet -     Ambulatory referral to Home Health  Chronic diastolic heart failure ([place]) Followed by cardiology. No new or worsening symptoms.  -     Ambulatory referral to [place]  Mixed simple and mucopurulent chronic bronchitis ([place]) Followed by pulmonology, no new or worsening symptoms. Continue home oxygen as prescribed.  -     Ambulatory referral to [place] hypertension BP slightly elevated today. DASH diet encouraged. Medications as prescribed. Follow up in 3 months. Labs pending.  -     CMP14+EGFR -     CBC with Differential/Platelet -     Ambulatory referral to [place]  Mild dementia ([place]) On new or worsening symptoms. Will refer to home health for assistance with medications and tasks at home.  -     Ambulatory referral to Home Health  Depression, recurrent [facility]) Doing well, no new or worsening symptoms.  -     Ambulatory referral to [place] weakness Improving daily since discharge home. Labs pending, referral for home health placed.  -     CMP14+EGFR -     CBC with Differential/Platelet -     Ambulatory referral to [place] (dyspnea on  exertion) Continue oxygen therapy as prescribed. Follow up with pulmonology as scheduled.  -     Ambulatory referral to [place]    Total time spent with patient 45 minutes.  Greater than 50% of encounter spent in coordination of care/counseling.  Continue all other maintenance medications.  Follow up plan: Return in about 1 month (around 05/10/2019), or if symptoms worsen or fail to improve, for general weakness.  Continue healthy lifestyle choices, including diet (rich in fruits, vegetables, and lean proteins, and low in salt and simple carbohydrates) and exercise (at least 30 minutes of moderate physical activity daily).  Educational handout given for COPD  The above assessment and management plan was discussed with the patient. The patient verbalized understanding of and has agreed to the management plan. Patient is aware to call the clinic if they develop any new symptoms or if symptoms persist or worsen. Patient is aware when to return to the clinic for a follow-up visit. Patient educated on when it is appropriate to go to the emergency department.   Monia Pouch, FNP-C [facility] [phone]

## 2019-04-14 ENCOUNTER — Ambulatory Visit: Payer: Medicare Other | Admitting: Pulmonary Disease

## 2019-04-22 ENCOUNTER — Ambulatory Visit: Payer: Medicare Other | Admitting: Neurology

## 2019-04-28 ENCOUNTER — Telehealth: Payer: Self-pay

## 2019-05-01 DEATH — deceased

## 2019-05-11 ENCOUNTER — Ambulatory Visit: Payer: Medicare Other | Admitting: Family Medicine

## 2019-06-01 ENCOUNTER — Ambulatory Visit: Payer: Medicare Other | Admitting: Neurology

## 2019-06-02 ENCOUNTER — Ambulatory Visit: Payer: Medicare Other | Admitting: Neurology

## 2019-07-06 ENCOUNTER — Ambulatory Visit: Payer: Medicare Other | Admitting: Cardiology

## 2022-09-17 NOTE — Telephone Encounter (Signed)
Erroneous encounter will close.
